# Patient Record
Sex: Male | Born: 1960
Health system: Southern US, Community
[De-identification: ages and names within clinical notes are randomized; demographics above are authoritative.]

## PROBLEM LIST (undated history)

## (undated) DIAGNOSIS — K219 Gastro-esophageal reflux disease without esophagitis: Secondary | ICD-10-CM

## (undated) DIAGNOSIS — I639 Cerebral infarction, unspecified: Secondary | ICD-10-CM

## (undated) DIAGNOSIS — F1010 Alcohol abuse, uncomplicated: Secondary | ICD-10-CM

## (undated) DIAGNOSIS — K279 Peptic ulcer, site unspecified, unspecified as acute or chronic, without hemorrhage or perforation: Secondary | ICD-10-CM

## (undated) DIAGNOSIS — K222 Esophageal obstruction: Secondary | ICD-10-CM

## (undated) DIAGNOSIS — I10 Essential (primary) hypertension: Secondary | ICD-10-CM

## (undated) DIAGNOSIS — E785 Hyperlipidemia, unspecified: Secondary | ICD-10-CM

## (undated) DIAGNOSIS — D649 Anemia, unspecified: Secondary | ICD-10-CM

## (undated) DIAGNOSIS — K269 Duodenal ulcer, unspecified as acute or chronic, without hemorrhage or perforation: Secondary | ICD-10-CM

## (undated) DIAGNOSIS — J189 Pneumonia, unspecified organism: Secondary | ICD-10-CM

## (undated) DIAGNOSIS — K2270 Barrett's esophagus without dysplasia: Secondary | ICD-10-CM

## (undated) DIAGNOSIS — F1410 Cocaine abuse, uncomplicated: Secondary | ICD-10-CM

## (undated) HISTORY — DX: Gastro-esophageal reflux disease without esophagitis: K21.9

## (undated) HISTORY — DX: Hyperlipidemia, unspecified: E78.5

---

## 2007-04-23 ENCOUNTER — Emergency Department (HOSPITAL_COMMUNITY): Admission: EM | Admit: 2007-04-23 | Discharge: 2007-04-23 | Payer: Self-pay | Admitting: Emergency Medicine

## 2009-08-24 ENCOUNTER — Emergency Department (HOSPITAL_COMMUNITY): Admission: EM | Admit: 2009-08-24 | Discharge: 2009-08-24 | Payer: Self-pay | Admitting: Emergency Medicine

## 2011-09-17 ENCOUNTER — Emergency Department (HOSPITAL_COMMUNITY): Payer: Self-pay

## 2011-09-17 ENCOUNTER — Emergency Department (HOSPITAL_COMMUNITY)
Admission: EM | Admit: 2011-09-17 | Discharge: 2011-09-17 | Disposition: A | Discharge status: Home / Self Care | Payer: Self-pay | Attending: Emergency Medicine | Admitting: Emergency Medicine

## 2011-09-17 ENCOUNTER — Encounter: Payer: Self-pay | Admitting: *Deleted

## 2011-09-17 DIAGNOSIS — J40 Bronchitis, not specified as acute or chronic: Secondary | ICD-10-CM | POA: Insufficient documentation

## 2011-09-17 DIAGNOSIS — F17200 Nicotine dependence, unspecified, uncomplicated: Secondary | ICD-10-CM | POA: Insufficient documentation

## 2011-09-17 DIAGNOSIS — J069 Acute upper respiratory infection, unspecified: Secondary | ICD-10-CM | POA: Insufficient documentation

## 2011-09-17 MED ORDER — DOXYCYCLINE HYCLATE 100 MG PO CAPS: 100.0000 mg | ORAL_CAPSULE | Freq: Two times a day (BID) | ORAL | Status: AC

## 2011-09-17 MED ORDER — PROMETHAZINE-CODEINE 6.25-10 MG/5ML PO SYRP: 5.0000 mL | ORAL_SOLUTION | ORAL | Status: AC | PRN

## 2011-09-17 MED ORDER — IBUPROFEN 800 MG PO TABS: 800.0000 mg | ORAL_TABLET | Freq: Three times a day (TID) | ORAL | Status: AC

## 2011-09-17 NOTE — ED Provider Notes (Signed)
History     CSN: [id number] Arrival date & time: 09/17/2011 12:56 PM   First MD Initiated Contact with Patient 09/17/11 1406      Chief Complaint  Patient presents with  . Cough  . Nasal Congestion    (Consider location/radiation/quality/duration/timing/severity/associated sxs/prior treatment) Patient is a 50 y.o. male presenting with cough. The history is provided by the patient.  Cough This is a new problem. The current episode started more than 2 days ago. The problem occurs hourly. The problem has not changed since onset.The cough is productive of sputum. The maximum temperature recorded prior to his arrival was 100 to 100.9 F. Associated symptoms include chest pain, chills, rhinorrhea, sore throat and myalgias. Pertinent negatives include no shortness of breath and no wheezing. He has tried nothing for the symptoms. He is a smoker. His past medical history is significant for asthma. His past medical history does not include bronchitis, pneumonia or COPD.    History reviewed. No pertinent past medical history.  History reviewed. No pertinent past surgical history.  No family history on file.  History  Substance Use Topics  . Smoking status: Current Everyday Smoker -- 0.5 packs/day    Types: Cigarettes  . Smokeless tobacco: Not on file  . Alcohol Use: 1.2 oz/week    2 Cans of beer per week     every other day      Review of Systems  Constitutional: Positive for chills. Negative for activity change.       All ROS Neg except as noted in HPI  HENT: Positive for sore throat and rhinorrhea. Negative for nosebleeds and neck pain.   Eyes: Negative for photophobia and discharge.  Respiratory: Positive for cough. Negative for shortness of breath and wheezing.   Cardiovascular: Positive for chest pain. Negative for palpitations.  Gastrointestinal: Negative for abdominal pain and blood in stool.  Genitourinary: Negative for dysuria, frequency and hematuria.  Musculoskeletal:  Positive for myalgias. Negative for back pain and arthralgias.  Skin: Negative.   Neurological: Negative for dizziness, seizures and speech difficulty.  Psychiatric/Behavioral: Negative for hallucinations and confusion.    Allergies  Review of patient's allergies indicates no known allergies.  Home Medications   Current Outpatient Rx  Name Route Sig Dispense Refill  . DOXYCYCLINE HYCLATE 100 MG PO CAPS Oral Take 1 capsule (100 mg total) by mouth 2 (two) times daily. 14 capsule 0  . IBUPROFEN 800 MG PO TABS Oral Take 1 tablet (800 mg total) by mouth 3 (three) times daily. 21 tablet 0  . PROMETHAZINE-CODEINE 6.25-10 MG/5ML PO SYRP Oral Take 5 mLs by mouth every 4 (four) hours as needed for cough. 120 mL 0    BP 130/85  Pulse 98  Temp(Src) 98.6 F (37 C) (Oral)  Resp 18  Ht 5' 8.5" (1.74 m)  Wt 155 lb (70.308 kg)  BMI 23.23 kg/m2  SpO2 100%  Physical Exam  Nursing note and vitals reviewed. Constitutional: He is oriented to person, place, and time. He appears well-developed and well-nourished.  Non-toxic appearance.  HENT:  Head: Normocephalic.  Right Ear: Tympanic membrane and external ear normal.  Left Ear: Tympanic membrane and external ear normal.       Nasal congestion  Eyes: EOM and lids are normal. Pupils are equal, round, and reactive to light.  Neck: Normal range of motion. Neck supple. Carotid bruit is not present.  Cardiovascular: Normal rate, regular rhythm, normal heart sounds, intact distal pulses and normal pulses.   Pulmonary/Chest: No respiratory  distress. He has rhonchi.  Abdominal: Soft. Bowel sounds are normal. There is no tenderness. There is no guarding.  Musculoskeletal: Normal range of motion.  Lymphadenopathy:       Head (right side): No submandibular adenopathy present.       Head (left side): No submandibular adenopathy present.    He has no cervical adenopathy.  Neurological: He is alert and oriented to person, place, and time. He has normal  strength. No cranial nerve deficit or sensory deficit.  Skin: Skin is warm and dry.  Psychiatric: He has a normal mood and affect. His speech is normal.    ED Course  Procedures (including critical care time)  Labs Reviewed - No data to display Dg Chest 2 View  09/17/2011  *RADIOLOGY REPORT*  Clinical Data: Cough for 3 days.  Smoker.  History of childhood asthma.  CHEST - 2 VIEW  Comparison: None.  Findings: Mild hyperinflation. Midline trachea.  Normal heart size and mediastinal contours. No pleural effusion or pneumothorax. Focal lucency at the left lung base.  Lungs are clear.  IMPRESSION:  1.  Hyperinflation, without acute disease. 2.  Focal lucency at the left lung base.  Favor focal bullous disease.  The sequelae of remote infection could have a similar appearance.  Less likely, this could be technique related/artifactual.  Original Report Authenticated By: Consuello Bossier, M.D.     1. Bronchitis   2. URI (upper respiratory infection)       MDM  I have reviewed nursing notes, vital signs, and all appropriate lab and imaging results for this patient.        Kathie Dike, [place] 09/17/11 1416

## 2011-09-17 NOTE — ED Notes (Signed)
Pt states has had a cough and cold like symptoms x 3 days. Pt denies fever/chills, N/V and pain at this time.

## 2011-09-17 NOTE — ED Notes (Signed)
C/o nasal congestion and cough x 4 days

## 2011-09-19 NOTE — ED Provider Notes (Signed)
Medical screening examination/treatment/procedure(s) were performed by non-physician practitioner and as supervising physician I was immediately available for consultation/collaboration.  Nicoletta Dress. Colon Branch, MD 09/19/11 1122

## 2012-03-03 ENCOUNTER — Emergency Department (HOSPITAL_COMMUNITY): Payer: Self-pay

## 2012-03-03 ENCOUNTER — Emergency Department (HOSPITAL_COMMUNITY)
Admission: EM | Admit: 2012-03-03 | Discharge: 2012-03-03 | Disposition: A | Discharge status: Home / Self Care | Payer: Self-pay | Attending: Emergency Medicine | Admitting: Emergency Medicine

## 2012-03-03 ENCOUNTER — Encounter (HOSPITAL_COMMUNITY): Payer: Self-pay | Admitting: Emergency Medicine

## 2012-03-03 DIAGNOSIS — R112 Nausea with vomiting, unspecified: Secondary | ICD-10-CM | POA: Insufficient documentation

## 2012-03-03 DIAGNOSIS — R1012 Left upper quadrant pain: Secondary | ICD-10-CM | POA: Insufficient documentation

## 2012-03-03 LAB — URINALYSIS, ROUTINE W REFLEX MICROSCOPIC
Bilirubin Urine: NEGATIVE
Glucose, UA: NEGATIVE mg/dL
Protein, ur: NEGATIVE mg/dL

## 2012-03-03 LAB — URINE MICROSCOPIC-ADD ON

## 2012-03-03 NOTE — Discharge Instructions (Signed)
Your xrays show only stool and gas in the colon. Use a mild laxative like Miralax to have several bowel movements.

## 2012-03-03 NOTE — ED Provider Notes (Signed)
History     CSN: [id number]  Arrival date & time 03/03/12  0151   First MD Initiated Contact with Patient 03/03/12 0234      Chief Complaint  Patient presents with  . Flank Pain    (Consider location/radiation/quality/duration/timing/severity/associated sxs/prior treatment) HPI  Evan Chavez is a 51 y.o. male who presents to the Emergency Department complaining of left upper quadrant pain that began an hour PTA. Pain was severe and he had two episodes of vomiting. States last BM yesterday and normal. Denies fever, chills, diarrhea, cough, shortness of breath. Currently is not nauseated. Pain has subsided. History reviewed. No pertinent past medical history.  History reviewed. No pertinent past surgical history.  No family history on file.  History  Substance Use Topics  . Smoking status: Current Everyday Smoker -- 0.5 packs/day    Types: Cigarettes  . Smokeless tobacco: Not on file  . Alcohol Use: 1.2 oz/week    2 Cans of beer per week     every other day      Review of Systems  Constitutional: Negative for fever.       10 Systems reviewed and are negative for acute change except as noted in the HPI.  HENT: Negative for congestion.   Eyes: Negative for discharge and redness.  Respiratory: Negative for cough and shortness of breath.   Cardiovascular: Negative for chest pain.  Gastrointestinal: Positive for nausea, vomiting and abdominal pain.  Musculoskeletal: Negative for back pain.  Skin: Negative for rash.  Neurological: Negative for syncope, numbness and headaches.  Psychiatric/Behavioral:       No behavior change.    Allergies  Review of patient's allergies indicates no known allergies.  Home Medications  No current outpatient prescriptions on file.  BP 158/96  Pulse 90  Temp(Src) 98 F (36.7 C) (Oral)  Resp 20  Ht 5\' 8"  (1.727 m)  Wt 159 lb (72.122 kg)  BMI 24.18 kg/m2  SpO2 99%  Physical Exam  Nursing note and vitals  reviewed. Constitutional: He is oriented to person, place, and time.       Awake, alert, nontoxic appearance.  HENT:  Head: Atraumatic.  Eyes: Right eye exhibits no discharge. Left eye exhibits no discharge.  Neck: Neck supple.  Cardiovascular: Normal rate, normal heart sounds and intact distal pulses.   Pulmonary/Chest: Effort normal. He exhibits no tenderness.  Abdominal: Soft. There is tenderness. There is no rebound and no guarding.       Mild LUQ tenderness to palpation  Genitourinary:       No cva tenderness  Musculoskeletal: He exhibits no tenderness.       Baseline ROM, no obvious new focal weakness.  Neurological: He is alert and oriented to person, place, and time.       Mental status and motor strength appears baseline for patient and situation.  Skin: No rash noted.  Psychiatric: He has a normal mood and affect.    ED Course  Procedures (including critical care time)  Results for orders placed during the hospital encounter of 03/03/12  URINALYSIS, ROUTINE W REFLEX MICROSCOPIC      Component Value Range   Color, Urine YELLOW  YELLOW    APPearance CLEAR  CLEAR    Specific Gravity, Urine 1.020  1.005 - 1.030    pH 7.0  5.0 - 8.0    Glucose, UA NEGATIVE  NEGATIVE (mg/dL)   Hgb urine dipstick TRACE (*) NEGATIVE    Bilirubin Urine NEGATIVE  NEGATIVE  Ketones, ur NEGATIVE  NEGATIVE (mg/dL)   Protein, ur NEGATIVE  NEGATIVE (mg/dL)   Urobilinogen, UA 0.2  0.0 - 1.0 (mg/dL)   Nitrite NEGATIVE  NEGATIVE    Leukocytes, UA NEGATIVE  NEGATIVE   URINE MICROSCOPIC-ADD ON      Component Value Range   Squamous Epithelial / LPF RARE  RARE    WBC, UA 7-10  <3 (WBC/hpf)   RBC / HPF 7-10  <3 (RBC/hpf)   Bacteria, UA RARE  RARE    Urine-Other MUCOUS PRESENT     Dg Abd Acute W/chest  03/03/2012  *RADIOLOGY REPORT*  Clinical Data: Left-sided abdominal pain  ACUTE ABDOMEN SERIES (ABDOMEN 2 VIEW & CHEST 1 VIEW)  Comparison: 09/17/2011 chest radiograph  Findings: Increased lucency  of the left lower lobe is again noted. Otherwise, lungs are clear.  No focal consolidation, pleural effusion, or pneumothorax.  Cardiomediastinal contours are within normal limits.  No acute osseous finding.  Nonobstructive bowel gas pattern.  No free intraperitoneal air identified.  Organ outlines normal where seen.  No acute osseous abnormality.  IMPRESSION: Nonobstructive bowel gas pattern.  Hyperlucency of the left lower lobe is unchanged and may be congenital or secondary to underlying bullous change.  Original Report Authenticated By: Waneta Martins, M.D.       MDM  Patient with left upper quadrant discomfort that began earlier this evening. Denies fever, chills, hematuria. Xray with no acute process noted. Patient has been pain free since arrival. Taken PO fluids and a snack. Reviewed results with patient. Pt stable in ED with no significant deterioration in condition.The patient appears reasonably screened and/or stabilized for discharge and I doubt any other medical condition or other [facility] requiring further screening, evaluation, or treatment in the ED at this time prior to discharge.  MDM Reviewed: nursing note and vitals Interpretation: labs and x-ray           Nicoletta Dress. Colon Branch, MD 03/03/12 [phone]

## 2012-03-03 NOTE — ED Notes (Signed)
Patient c/o left flank pain that started about an hour ago.  States has vomited x 2.

## 2018-06-13 DIAGNOSIS — I639 Cerebral infarction, unspecified: Secondary | ICD-10-CM

## 2018-06-13 HISTORY — DX: Cerebral infarction, unspecified: I63.9

## 2018-07-12 ENCOUNTER — Other Ambulatory Visit: Payer: Self-pay

## 2018-07-12 ENCOUNTER — Emergency Department (HOSPITAL_COMMUNITY): Payer: Medicaid Other

## 2018-07-12 ENCOUNTER — Encounter (HOSPITAL_COMMUNITY): Payer: Self-pay | Admitting: Emergency Medicine

## 2018-07-12 ENCOUNTER — Inpatient Hospital Stay (HOSPITAL_COMMUNITY)
Admission: EM | Admit: 2018-07-12 | Discharge: 2018-07-15 | DRG: 065 | Disposition: A | Discharge status: Skilled Nursing Facility | Payer: Medicaid Other | Attending: Internal Medicine | Admitting: Internal Medicine

## 2018-07-12 ENCOUNTER — Observation Stay (HOSPITAL_COMMUNITY): Payer: Medicaid Other

## 2018-07-12 DIAGNOSIS — Z720 Tobacco use: Secondary | ICD-10-CM

## 2018-07-12 DIAGNOSIS — I471 Supraventricular tachycardia: Secondary | ICD-10-CM | POA: Diagnosis present

## 2018-07-12 DIAGNOSIS — R29710 NIHSS score 10: Secondary | ICD-10-CM | POA: Diagnosis present

## 2018-07-12 DIAGNOSIS — R29810 Facial weakness: Secondary | ICD-10-CM | POA: Diagnosis present

## 2018-07-12 DIAGNOSIS — I6381 Other cerebral infarction due to occlusion or stenosis of small artery: Principal | ICD-10-CM | POA: Diagnosis present

## 2018-07-12 DIAGNOSIS — E785 Hyperlipidemia, unspecified: Secondary | ICD-10-CM | POA: Diagnosis present

## 2018-07-12 DIAGNOSIS — F1410 Cocaine abuse, uncomplicated: Secondary | ICD-10-CM | POA: Diagnosis present

## 2018-07-12 DIAGNOSIS — G8191 Hemiplegia, unspecified affecting right dominant side: Secondary | ICD-10-CM | POA: Diagnosis present

## 2018-07-12 DIAGNOSIS — F1010 Alcohol abuse, uncomplicated: Secondary | ICD-10-CM | POA: Diagnosis present

## 2018-07-12 DIAGNOSIS — R471 Dysarthria and anarthria: Secondary | ICD-10-CM | POA: Diagnosis present

## 2018-07-12 DIAGNOSIS — F17210 Nicotine dependence, cigarettes, uncomplicated: Secondary | ICD-10-CM | POA: Diagnosis present

## 2018-07-12 DIAGNOSIS — I5032 Chronic diastolic (congestive) heart failure: Secondary | ICD-10-CM | POA: Diagnosis present

## 2018-07-12 DIAGNOSIS — Z8673 Personal history of transient ischemic attack (TIA), and cerebral infarction without residual deficits: Secondary | ICD-10-CM | POA: Diagnosis present

## 2018-07-12 DIAGNOSIS — I639 Cerebral infarction, unspecified: Secondary | ICD-10-CM | POA: Insufficient documentation

## 2018-07-12 DIAGNOSIS — I110 Hypertensive heart disease with heart failure: Secondary | ICD-10-CM | POA: Diagnosis present

## 2018-07-12 DIAGNOSIS — I10 Essential (primary) hypertension: Secondary | ICD-10-CM

## 2018-07-12 HISTORY — DX: Alcohol abuse, uncomplicated: F10.10

## 2018-07-12 HISTORY — DX: Cocaine abuse, uncomplicated: F14.10

## 2018-07-12 LAB — COMPREHENSIVE METABOLIC PANEL
ALBUMIN: 4 g/dL (ref 3.5–5.0)
ALT: 30 U/L (ref 0–44)
AST: 18 U/L (ref 15–41)
Alkaline Phosphatase: 88 U/L (ref 38–126)
Anion gap: 10 (ref 5–15)
BILIRUBIN TOTAL: 0.5 mg/dL (ref 0.3–1.2)
BUN: 12 mg/dL (ref 6–20)
CHLORIDE: 107 mmol/L (ref 98–111)
CO2: 20 mmol/L — AB (ref 22–32)
Calcium: 8.8 mg/dL — ABNORMAL LOW (ref 8.9–10.3)
Creatinine, Ser: 0.91 mg/dL (ref 0.61–1.24)
GFR calc Af Amer: >60 mL/min (ref >60)
GFR calc non Af Amer: >60 mL/min (ref >60)
Glucose, Bld: 100 mg/dL — ABNORMAL HIGH (ref 70–99)
Potassium: 4.1 mmol/L (ref 3.5–5.1)
SODIUM: 137 mmol/L (ref 135–145)
Total Protein: 8 g/dL (ref 6.5–8.1)

## 2018-07-12 LAB — RAPID URINE DRUG SCREEN, HOSP PERFORMED
Amphetamines: NOT DETECTED
BARBITURATES: NOT DETECTED
BENZODIAZEPINES: NOT DETECTED
COCAINE: POSITIVE — AB
OPIATES: NOT DETECTED
Tetrahydrocannabinol: NOT DETECTED

## 2018-07-12 LAB — CBC
HCT: 44.1 % (ref 39.0–52.0)
HEMOGLOBIN: 14 g/dL (ref 13.0–17.0)
MCH: 27.6 pg (ref 26.0–34.0)
MCHC: 31.7 g/dL (ref 30.0–36.0)
MCV: 87 fL (ref 78.0–100.0)
Platelets: 269 10*3/uL (ref 150–400)
RBC: 5.07 MIL/uL (ref 4.22–5.81)
RDW: 13.6 % (ref 11.5–15.5)
WBC: 5.4 10*3/uL (ref 4.0–10.5)

## 2018-07-12 LAB — URINALYSIS, ROUTINE W REFLEX MICROSCOPIC
BACTERIA UA: NONE SEEN
Bilirubin Urine: NEGATIVE
GLUCOSE, UA: NEGATIVE mg/dL
KETONES UR: NEGATIVE mg/dL
LEUKOCYTES UA: NEGATIVE
Nitrite: NEGATIVE
PROTEIN: NEGATIVE mg/dL
Specific Gravity, Urine: >1.046 — ABNORMAL HIGH (ref 1.005–1.030)
pH: 5 (ref 5.0–8.0)

## 2018-07-12 LAB — APTT: APTT: 30 s (ref 24–36)

## 2018-07-12 LAB — DIFFERENTIAL
BASOS ABS: 0 10*3/uL (ref 0.0–0.1)
Basophils Relative: 1 %
Eosinophils Absolute: 0.1 10*3/uL (ref 0.0–0.7)
Eosinophils Relative: 1 %
LYMPHS ABS: 1.4 10*3/uL (ref 0.7–4.0)
Lymphocytes Relative: 26 %
Monocytes Absolute: 0.3 10*3/uL (ref 0.1–1.0)
Monocytes Relative: 6 %
NEUTROS PCT: 66 %
Neutro Abs: 3.6 10*3/uL (ref 1.7–7.7)

## 2018-07-12 LAB — I-STAT TROPONIN, ED: TROPONIN I, POC: 0 ng/mL (ref 0.00–0.08)

## 2018-07-12 LAB — PROTIME-INR
INR: 1.02
Prothrombin Time: 13.3 seconds (ref 11.4–15.2)

## 2018-07-12 LAB — CBG MONITORING, ED: GLUCOSE-CAPILLARY: 86 mg/dL (ref 70–99)

## 2018-07-12 LAB — ETHANOL: Alcohol, Ethyl (B): <10 mg/dL (ref <10)

## 2018-07-12 MED ORDER — ASPIRIN 325 MG PO TABS
325.0000 mg | ORAL_TABLET | Freq: Every day | ORAL | Status: DC
  Administered 2018-07-12 – 2018-07-15 (×4): 325 mg via ORAL
  Filled 2018-07-12 (×4): qty 1

## 2018-07-12 MED ORDER — ACETAMINOPHEN 650 MG RE SUPP: 650.0000 mg | RECTAL | Status: DC | PRN

## 2018-07-12 MED ORDER — HYDRALAZINE HCL 20 MG/ML IJ SOLN: 10.0000 mg | Freq: Four times a day (QID) | INTRAMUSCULAR | Status: DC | PRN

## 2018-07-12 MED ORDER — IOPAMIDOL (ISOVUE-370) INJECTION 76%
100.0000 mL | Freq: Once | INTRAVENOUS | Status: AC | PRN
  Administered 2018-07-12: 100 mL via INTRAVENOUS

## 2018-07-12 MED ORDER — STROKE: EARLY STAGES OF RECOVERY BOOK
Freq: Once | Status: AC
  Administered 2018-07-12: 1
  Filled 2018-07-12 (×2): qty 1

## 2018-07-12 MED ORDER — ACETAMINOPHEN 325 MG PO TABS: 650.0000 mg | ORAL_TABLET | ORAL | Status: DC | PRN

## 2018-07-12 MED ORDER — ONDANSETRON HCL 4 MG/2ML IJ SOLN
4.0000 mg | Freq: Four times a day (QID) | INTRAMUSCULAR | Status: DC | PRN
  Administered 2018-07-12 – 2018-07-14 (×2): 4 mg via INTRAVENOUS
  Filled 2018-07-12 (×2): qty 2

## 2018-07-12 MED ORDER — ENOXAPARIN SODIUM 40 MG/0.4ML ~~LOC~~ SOLN
40.0000 mg | SUBCUTANEOUS | Status: DC
  Administered 2018-07-12 – 2018-07-14 (×3): 40 mg via SUBCUTANEOUS
  Filled 2018-07-12 (×3): qty 0.4

## 2018-07-12 MED ORDER — SENNOSIDES-DOCUSATE SODIUM 8.6-50 MG PO TABS
1.0000 | ORAL_TABLET | Freq: Every evening | ORAL | Status: DC | PRN
  Filled 2018-07-12: qty 1

## 2018-07-12 MED ORDER — ACETAMINOPHEN 160 MG/5ML PO SOLN: 650.0000 mg | ORAL | Status: DC | PRN

## 2018-07-12 MED ORDER — SODIUM CHLORIDE 0.9 % IV SOLN
INTRAVENOUS | Status: DC
  Administered 2018-07-12: 22:00:00 via INTRAVENOUS

## 2018-07-12 NOTE — ED Notes (Signed)
ED TO INPATIENT HANDOFF REPORT  Name/Age/Gender Evan Chavez 57 y.o. male  Code Status   Home/SNF/Other Home  Chief Complaint ?STROKE  Level of Care/Admitting Diagnosis ED Disposition    ED Disposition Condition Comment   Admit  Hospital Area: [facility] [[id number]]  Level of Care: Telemetry [5]  Diagnosis: Acute ischemic stroke [facility]) [[id number]]  Admitting Physician: TAT, DAVID [[id number]]  Attending Physician: TAT, DAVID [[id number]]  PT Class (Do Not Modify): Observation [104]  PT Acc Code (Do Not Modify): Observation [10022]       Medical History History reviewed. No pertinent past medical history.  Allergies No Known Allergies  IV Location/Drains/Wounds Patient Lines/Drains/Airways Status   Active Line/Drains/Airways    Name:   Placement date:   Placement time:   Site:   Days:   Peripheral IV 07/12/18 Left Forearm   07/12/18    0915    Forearm   less than 1   Peripheral IV 07/12/18 Left Antecubital   07/12/18    6962    Antecubital   less than 1          Labs/Imaging Results for orders placed or performed during the hospital encounter of 07/12/18 (from the past 48 hour(s))  Urine rapid drug screen (hosp performed)     Status: Abnormal   Collection Time: 07/12/18  9:13 AM  Result Value Ref Range   Opiates NONE DETECTED NONE DETECTED   Cocaine POSITIVE (A) NONE DETECTED   Benzodiazepines NONE DETECTED NONE DETECTED   Amphetamines NONE DETECTED NONE DETECTED   Tetrahydrocannabinol NONE DETECTED NONE DETECTED   Barbiturates NONE DETECTED NONE DETECTED    Comment: (NOTE) DRUG SCREEN FOR MEDICAL PURPOSES ONLY.  IF CONFIRMATION IS NEEDED FOR ANY PURPOSE, NOTIFY LAB WITHIN 5 DAYS. LOWEST DETECTABLE LIMITS FOR URINE DRUG SCREEN Drug Class                     Cutoff (ng/mL) Amphetamine and metabolites    1000 Barbiturate and metabolites    200 Benzodiazepine                 952 Tricyclics and metabolites     300 Opiates and metabolites        300 Cocaine and  metabolites        300 THC                            50 Performed at [facility], [address] [place], [place] [address]   Urinalysis, Routine w reflex microscopic     Status: Abnormal   Collection Time: 07/12/18  9:13 AM  Result Value Ref Range   Color, Urine YELLOW YELLOW   APPearance CLEAR CLEAR   Specific Gravity, Urine >1.046 (H) 1.005 - 1.030   pH 5.0 5.0 - 8.0   Glucose, UA NEGATIVE NEGATIVE mg/dL   Hgb urine dipstick SMALL (A) NEGATIVE   Bilirubin Urine NEGATIVE NEGATIVE   Ketones, ur NEGATIVE NEGATIVE mg/dL   Protein, ur NEGATIVE NEGATIVE mg/dL   Nitrite NEGATIVE NEGATIVE   Leukocytes, UA NEGATIVE NEGATIVE   RBC / HPF 11-20 0 - 5 RBC/hpf   WBC, UA 0-5 0 - 5 WBC/hpf   Bacteria, UA NONE SEEN NONE SEEN   Mucus PRESENT     Comment: Performed at [facility], [address] [place], [place] [address]  Ethanol     Status: None   Collection Time: 07/12/18  9:25 AM  Result Value Ref Range   Alcohol, Ethyl (B) <10 <10 mg/dL    Comment: (NOTE) Lowest detectable limit for serum alcohol is 10 mg/dL. For medical purposes only. Performed at [facility], [address], [place], [place] [address]   Protime-INR     Status: None   Collection Time: 07/12/18  9:25 AM  Result Value Ref Range   Prothrombin Time 13.3 11.4 - 15.2 seconds   INR 1.02     Comment: Performed at [facility], [address], [place], [place] [address]  APTT     Status: None   Collection Time: 07/12/18  9:25 AM  Result Value Ref Range   aPTT 30 24 - 36 seconds    Comment: Performed at [facility], [address], [place], [place] [address]  CBC     Status: None   Collection Time: 07/12/18  9:25 AM  Result Value Ref Range   WBC 5.4 4.0 - 10.5 K/uL   RBC 5.07 4.22 - 5.81 MIL/uL   Hemoglobin 14.0 13.0 - 17.0 g/dL   HCT 44.1 39.0 - 52.0 %   MCV 87.0 78.0 - 100.0 fL   MCH 27.6 26.0 - 34.0 pg   MCHC 31.7 30.0 - 36.0 g/dL   RDW 13.6 11.5 - 15.5 %   Platelets 269 150 - 400 K/uL    Comment: Performed at  [facility], [address] [place], [place] [address]  Differential     Status: None   Collection Time: 07/12/18  9:25 AM  Result Value Ref Range   Neutrophils Relative % 66 %   Neutro Abs 3.6 1.7 - 7.7 K/uL   Lymphocytes Relative 26 %   Lymphs Abs 1.4 0.7 - 4.0 K/uL   Monocytes Relative 6 %   Monocytes Absolute 0.3 0.1 - 1.0 K/uL   Eosinophils Relative 1 %   Eosinophils Absolute 0.1 0.0 - 0.7 K/uL   Basophils Relative 1 %   Basophils Absolute 0.0 0.0 - 0.1 K/uL    Comment: Performed at [facility], [address], [place], [place] [address]  Comprehensive metabolic panel     Status: Abnormal   Collection Time: 07/12/18  9:25 AM  Result Value Ref Range   Sodium 137 135 - 145 mmol/L   Potassium 4.1 3.5 - 5.1 mmol/L   Chloride 107 98 - 111 mmol/L   CO2 20 (L) 22 - 32 mmol/L   Glucose, Bld 100 (H) 70 - 99 mg/dL   BUN 12 6 - 20 mg/dL   Creatinine, Ser 0.91 0.61 - 1.24 mg/dL   Calcium 8.8 (L) 8.9 - 10.3 mg/dL   Total Protein 8.0 6.5 - 8.1 g/dL   Albumin 4.0 3.5 - 5.0 g/dL   AST 18 15 - 41 U/L   ALT 30 0 - 44 U/L   Alkaline Phosphatase 88 38 - 126 U/L   Total Bilirubin 0.5 0.3 - 1.2 mg/dL   GFR calc non Af Amer >60 >60 mL/min   GFR calc Af Amer >60 >60 mL/min    Comment: (NOTE) The eGFR has been calculated using the CKD EPI equation. This calculation has not been validated in all clinical situations. eGFR's persistently <60 mL/min signify possible Chronic Kidney Disease.    Anion gap 10 5 - 15    Comment: Performed at [facility], [address] [place], [place] [address]  I-stat troponin, ED     Status: None   Collection Time: 07/12/18  9:32 AM  Result Value Ref Range  Troponin i, poc 0.00 0.00 - 0.08 ng/mL   Comment 3            Comment: Due to the release kinetics of cTnI, a negative result within the first hours of the onset of symptoms does not rule out myocardial infarction with certainty. If myocardial infarction is still suspected, repeat the test at  appropriate intervals.   CBG monitoring, ED     Status: None   Collection Time: 07/12/18 10:05 AM  Result Value Ref Range   Glucose-Capillary 86 70 - 99 mg/dL   Ct Angio Head W Or Wo Contrast  Result Date: 07/12/2018 CLINICAL DATA:  Right arm weakness since 07/11/2018 EXAM: CT ANGIOGRAPHY HEAD AND NECK TECHNIQUE: Multidetector CT imaging of the head and neck was performed using the standard protocol during bolus administration of intravenous contrast. Multiplanar CT image reconstructions and MIPs were obtained to evaluate the vascular anatomy. Carotid stenosis measurements (when applicable) are obtained utilizing NASCET criteria, using the distal internal carotid diameter as the denominator. CONTRAST:  142m ISOVUE-370 IOPAMIDOL (ISOVUE-370) INJECTION 76% COMPARISON:  Noncontrast head CT from earlier today FINDINGS: CTA NECK FINDINGS Aortic arch: Limited coverage is negative. Brachiocephalic origin is not seen. Right carotid system: Mild narrowing of the distal ICA due to tortuosity and kinking. Mild non ulcerated plaque about the bifurcation. Left carotid system: Mild calcified plaque at the ICA bulb. No stenosis or ulceration. Vertebral arteries: No proximal subclavian stenosis or ulceration. Skeleton: Left dominant vertebral artery. Mild calcified plaque at the right vertebral origin. No flow limiting stenosis in the vertebrals or subclavians. Other neck: 11 mm left thyroid nodule, partially cystic, below size threshold for sonographic follow-up per consensus guidelines. Upper chest: Mild centrilobular emphysema. Review of the MIP images confirms the above findings CTA HEAD FINDINGS Anterior circulation: Calcified plaque on the carotid siphons. No branch occlusion or flow limiting stenosis. Negative for aneurysm. Posterior circulation: Vessels are smooth and widely patent. Fenestration of the proximal basilar. Venous sinuses: Patent Anatomic variants: As above Delayed phase: No abnormal intracranial  enhancement. Multiple remote small vessel infarcts. Review of the MIP images confirms the above findings IMPRESSION: 1. No emergent finding. 2. Mild atherosclerosis in the neck without stenosis or ulceration. 3. Multiple lacunar infarcts. 4.  Emphysema (ICD10-J43.9). Electronically Signed   By: JMonte FantasiaM.D.   On: 07/12/2018 11:10   Ct Head Wo Contrast  Result Date: 07/12/2018 CLINICAL DATA:  Right-sided weakness and right facial droop EXAM: CT HEAD WITHOUT CONTRAST TECHNIQUE: Contiguous axial images were obtained from the base of the skull through the vertex without intravenous contrast. COMPARISON:  None. FINDINGS: Brain: There is mild diffuse atrophy for age. There is no intracranial mass, hemorrhage, extra-axial fluid collection, or midline shift. There is mild small vessel disease in the centra semiovale bilaterally. There is small vessel disease with clear infarcts in the anterior limbs of the left internal and external capsules. There is a lacunar infarct in the anterior superior left thalamus. There is a small lacunar infarct in the anterior most aspect of the right thalamus. There are age uncertain small infarcts in each lower midbrain-upper pons junction. There is a focus of decreased attenuation in the inferior left centrum semiovale at the level of the left frontal horn of the lateral ventricle which appears more prominent than other areas of small vessel disease and may represent a recent white matter infarct in this area. Vascular: No appreciable hyperdense vessel is demonstrable on this study. There is calcification in each carotid siphon  region. Skull: Bony calvarium appears intact. Sinuses/Orbits: There is mild mucosal thickening in several ethmoid air cells. Other visualized paranasal sinuses are clear. Visualized orbits appear symmetric bilaterally. Other: Mastoid air cells are clear. IMPRESSION: Atrophy with periventricular small vessel disease. Prior lacunar infarcts in the left basal  ganglia and bilateral thalami. Age uncertain infarcts in the lower midbrain-upper pons junction, more notable on the left than on the right. Question recent small infarct in the white matter of the inferior left centrum semiovale at the frontal horn left lateral ventricle level. No mass or hemorrhage. Foci of arterial vascular calcification noted. Mucosal thickening noted in several ethmoid air cells. Electronically Signed   By: Lowella Grip III M.D.   On: 07/12/2018 09:39   Ct Angio Neck W And/or Wo Contrast  Result Date: 07/12/2018 CLINICAL DATA:  Right arm weakness since 07/11/2018 EXAM: CT ANGIOGRAPHY HEAD AND NECK TECHNIQUE: Multidetector CT imaging of the head and neck was performed using the standard protocol during bolus administration of intravenous contrast. Multiplanar CT image reconstructions and MIPs were obtained to evaluate the vascular anatomy. Carotid stenosis measurements (when applicable) are obtained utilizing NASCET criteria, using the distal internal carotid diameter as the denominator. CONTRAST:  114m ISOVUE-370 IOPAMIDOL (ISOVUE-370) INJECTION 76% COMPARISON:  Noncontrast head CT from earlier today FINDINGS: CTA NECK FINDINGS Aortic arch: Limited coverage is negative. Brachiocephalic origin is not seen. Right carotid system: Mild narrowing of the distal ICA due to tortuosity and kinking. Mild non ulcerated plaque about the bifurcation. Left carotid system: Mild calcified plaque at the ICA bulb. No stenosis or ulceration. Vertebral arteries: No proximal subclavian stenosis or ulceration. Skeleton: Left dominant vertebral artery. Mild calcified plaque at the right vertebral origin. No flow limiting stenosis in the vertebrals or subclavians. Other neck: 11 mm left thyroid nodule, partially cystic, below size threshold for sonographic follow-up per consensus guidelines. Upper chest: Mild centrilobular emphysema. Review of the MIP images confirms the above findings CTA HEAD FINDINGS  Anterior circulation: Calcified plaque on the carotid siphons. No branch occlusion or flow limiting stenosis. Negative for aneurysm. Posterior circulation: Vessels are smooth and widely patent. Fenestration of the proximal basilar. Venous sinuses: Patent Anatomic variants: As above Delayed phase: No abnormal intracranial enhancement. Multiple remote small vessel infarcts. Review of the MIP images confirms the above findings IMPRESSION: 1. No emergent finding. 2. Mild atherosclerosis in the neck without stenosis or ulceration. 3. Multiple lacunar infarcts. 4.  Emphysema (ICD10-J43.9). Electronically Signed   By: JMonte FantasiaM.D.   On: 07/12/2018 11:10    Pending Labs [employer](From admission, onward)    Start     Ordered   Signed and Held  HIV antibody (Routine Testing)  Once,   R     Signed and Held   Signed and Held  Hemoglobin A1c  Tomorrow morning,   R     Signed and Held   Signed and Held  Lipid panel  Tomorrow morning,   R    Comments:  Fasting    Signed and Held   SVisual merchandiserand Held  Urine rapid drug screen (hosp performed)  STAT,   R     Signed and Held   Signed and Held  Urinalysis, Complete w Microscopic  Once,   R     Signed and Held   Signed and Held  Culture, Urine  Once,   R     Signed and Held          Vitals/Pain TFedEx  07/12/18 1600 07/12/18 1630 07/12/18 1700 07/12/18 1730  BP: (!) 162/113 (!) 155/111 (!) 161/103 (!) 148/91  Pulse: 86  83 81  Resp:      Temp:      TempSrc:      SpO2: 99%  99% 98%  Weight:      Height:      PainSc:        Isolation Precautions No active isolations  Medications Medications  iopamidol (ISOVUE-370) 76 % injection 100 mL (100 mLs Intravenous Contrast Given 07/12/18 1024)    Mobility walks

## 2018-07-12 NOTE — ED Notes (Signed)
Pt's daughter in law arrived and disclosed pt has been using cocaine for many years, but since the death of his wife 2 1/2 years ago his use has increased.  Has been laying around for the past few days and using more.  States pt was last seen up walking about 3 days ago.

## 2018-07-12 NOTE — ED Notes (Signed)
Pt unable to provide urine sample at this time 

## 2018-07-12 NOTE — ED Triage Notes (Signed)
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  Pt states he thought it would get better and ignored it.  Pt has significant right sided weakness with right facial droop.

## 2018-07-12 NOTE — H&P (Signed)
History and Physical  Evan Chavez:[id number] DOB: [birth year] DOA: 07/12/2018   PCP: Patient, No Pcp Per   Patient coming from: Home  Chief Complaint: dysarthria, right hemiparesis  HPI:  Evan Chavez is a 57 y.o. male with medical history of hypertension and polysubstance abuse presenting with right hemiparesis and dysarthria that began around 11:30 PM on 07/11/2018 while the patient was at work.  The patient works as a Curator, and he noticed some weakness in his right upper extremity and subsequently right lower extremity while he was at work.  Later, the patient developed some dysarthria.  He did not think anything of it and subsequently finished his shift and went home.  He woke up in the morning of 07/12/2018 with persistent symptoms and right facial droop.  As result, the patient presented to emergency department for further evaluation.  He denies any headache, visual disturbance, syncope, chest pain, shortness breath, nausea or vomiting, diarrhea, abdominal pain.  Notably, the patient states that he used cocaine 2 to 3 days prior to this admission.  He states that he uses cocaine to 3 times per week.  He denies any other illicit drug use.  He smokes 1/2 pack/day. In the emergency department, the patient was afebrile hemodynamically stable saturating 100% on room air.  BMP, LFTs, and CBC were essentially unremarkable.  CT of the brain showed prior lacunar strokes in the basal ganglia and bilateral thalami with age uncertain infarcts of the left pons/midbrain junction.  CT angiogram of the head and neck showed no hemodynamically significant stenosis.  Assessment/Plan: Acute ischemic stroke -Neurology Consult -PT/OT evaluation -Speech therapy eval -CT brain--CT of the brain showed prior lacunar strokes in the basal ganglia and bilateral thalami with age uncertain infarcts of the left pons/midbrain junction.   -MRI brain-- -CTA H&N--no hemodynamically significant  stenosis -Echo-- -LDL-- -HbA1C-- -Antiplatelet--ASA 325 mg   Essential Hypertension -allow for permissive HTN -has not seen a physician x 10 years  Polysubstance abuse -tobacco and cocaine -I have discussed tobacco cessation with the patient.  I have counseled the patient regarding the negative impacts of continued tobacco use including but not limited to lung cancer, COPD, and cardiovascular disease.  I have discussed alternatives to tobacco and modalities that may help facilitate tobacco cessation including but not limited to biofeedback, hypnosis, and medications.  Total time spent with tobacco counseling was 4 minutes.            History reviewed. No pertinent past medical history. History reviewed. No pertinent surgical history. Social History:  reports that he has been smoking cigarettes. He has been smoking about 0.50 packs per day. He has never used smokeless tobacco. He reports that he drinks about 2.0 standard drinks of alcohol per week. He reports that he has current or past drug history. Drug: Cocaine.   History reviewed. No pertinent family history.   No Known Allergies   Prior to Admission medications   Not on File    Review of Systems:  Constitutional:  No weight loss, night sweats, Fevers, chills, fatigue.  Head&Eyes: No headache.  No vision loss.  No eye pain or scotoma ENT:  No Difficulty swallowing,Tooth/dental problems,Sore throat,  No ear ache, post nasal drip,  Cardio-vascular:  No chest pain, Orthopnea, PND, swelling in lower extremities,  dizziness, palpitations  GI:  No  abdominal pain, nausea, vomiting, diarrhea, loss of appetite, hematochezia, melena, heartburn, indigestion, Resp:  No shortness of breath with exertion or at rest.  No cough. No coughing up of blood .No wheezing.No chest wall deformity  Skin:  no rash or lesions.  GU:  no dysuria, change in color of urine, no urgency or frequency. No flank pain.  Musculoskeletal:  No  joint pain or swelling. No decreased range of motion. No back pain.  Psych:  No change in mood or affect. No depression or anxiety. Neurologic: No headache, no dysesthesia,no vision loss. No syncope  Physical Exam: Vitals:   07/12/18 0906 07/12/18 0908 07/12/18 0930 07/12/18 1000  BP:  (!) 163/104 (!) 162/108 (!) 163/88  Pulse:  86 79 76  Resp:  18 (!) 21 20  Temp:  98 F (36.7 C)    TempSrc:  Oral    SpO2:  100% 98% 99%  Weight: 65.8 kg     Height: 5\' 8"  (1.727 m)      General:  A&O x 3, NAD, nontoxic, pleasant/cooperative Head/Eye: No conjunctival hemorrhage, no icterus, [place]/AT, No nystagmus ENT:  No icterus,  No thrush, good dentition, no pharyngeal exudate Neck:  No masses, no lymphadenpathy, no bruits CV:  RRR, no rub, no gallop, no S3 Lung:  CTAB, good air movement, no wheeze, no rhonchi Abdomen: soft/NT, +BS, nondistended, no peritoneal signs Ext: No cyanosis, No rashes, No petechiae, No lymphangitis, No edema Neuro: CNII-XII intact, strength 5/5 in LUE and LLE and 4- /5 RLE, RUE extremities, no dysmetria  Labs on Admission:  Basic Metabolic Panel: Recent Labs  Lab 07/12/18 0925  NA 137  K 4.1  CL 107  CO2 20*  GLUCOSE 100*  BUN 12  CREATININE 0.91  CALCIUM 8.8*   Liver Function Tests: Recent Labs  Lab 07/12/18 0925  AST 18  ALT 30  ALKPHOS 88  BILITOT 0.5  PROT 8.0  ALBUMIN 4.0   No results for input(s): LIPASE, AMYLASE in the last 168 hours. No results for input(s): AMMONIA in the last 168 hours. CBC: Recent Labs  Lab 07/12/18 0925  WBC 5.4  NEUTROABS 3.6  HGB 14.0  HCT 44.1  MCV 87.0  PLT 269   Coagulation Profile: Recent Labs  Lab 07/12/18 0925  INR 1.02   Cardiac Enzymes: No results for input(s): CKTOTAL, CKMB, CKMBINDEX, TROPONINI in the last 168 hours. BNP: Invalid input(s): POCBNP CBG: Recent Labs  Lab 07/12/18 1005  GLUCAP 86   Urine analysis:    Component Value Date/Time   COLORURINE YELLOW 03/03/2012 0205    APPEARANCEUR CLEAR 03/03/2012 0205   LABSPEC 1.020 03/03/2012 0205   PHURINE 7.0 03/03/2012 0205   GLUCOSEU NEGATIVE 03/03/2012 0205   HGBUR TRACE (A) 03/03/2012 0205   BILIRUBINUR NEGATIVE 03/03/2012 0205   KETONESUR NEGATIVE 03/03/2012 0205   PROTEINUR NEGATIVE 03/03/2012 0205   UROBILINOGEN 0.2 03/03/2012 0205   NITRITE NEGATIVE 03/03/2012 0205   LEUKOCYTESUR NEGATIVE 03/03/2012 0205   Sepsis Labs: @LABRCNTIP (procalcitonin:4,lacticidven:4) )No results found for this or any previous visit (from the past 240 hour(s)).   Radiological Exams on Admission: Ct Angio Head W Or Wo Contrast  Result Date: 07/12/2018 CLINICAL DATA:  Right arm weakness since 07/11/2018 EXAM: CT ANGIOGRAPHY HEAD AND NECK TECHNIQUE: Multidetector CT imaging of the head and neck was performed using the standard protocol during bolus administration of intravenous contrast. Multiplanar CT image reconstructions and MIPs were obtained to evaluate the vascular anatomy. Carotid stenosis measurements (when applicable) are obtained utilizing NASCET criteria, using the distal internal carotid diameter as the denominator. CONTRAST:  156mL ISOVUE-370 IOPAMIDOL (ISOVUE-370) INJECTION 76% COMPARISON:  Noncontrast head CT  from earlier today FINDINGS: CTA NECK FINDINGS Aortic arch: Limited coverage is negative. Brachiocephalic origin is not seen. Right carotid system: Mild narrowing of the distal ICA due to tortuosity and kinking. Mild non ulcerated plaque about the bifurcation. Left carotid system: Mild calcified plaque at the ICA bulb. No stenosis or ulceration. Vertebral arteries: No proximal subclavian stenosis or ulceration. Skeleton: Left dominant vertebral artery. Mild calcified plaque at the right vertebral origin. No flow limiting stenosis in the vertebrals or subclavians. Other neck: 11 mm left thyroid nodule, partially cystic, below size threshold for sonographic follow-up per consensus guidelines. Upper chest: Mild centrilobular  emphysema. Review of the MIP images confirms the above findings CTA HEAD FINDINGS Anterior circulation: Calcified plaque on the carotid siphons. No branch occlusion or flow limiting stenosis. Negative for aneurysm. Posterior circulation: Vessels are smooth and widely patent. Fenestration of the proximal basilar. Venous sinuses: Patent Anatomic variants: As above Delayed phase: No abnormal intracranial enhancement. Multiple remote small vessel infarcts. Review of the MIP images confirms the above findings IMPRESSION: 1. No emergent finding. 2. Mild atherosclerosis in the neck without stenosis or ulceration. 3. Multiple lacunar infarcts. 4.  Emphysema (ICD10-J43.9). Electronically Signed   By: Monte Fantasia M.D.   On: 07/12/2018 11:10   Ct Head Wo Contrast  Result Date: 07/12/2018 CLINICAL DATA:  Right-sided weakness and right facial droop EXAM: CT HEAD WITHOUT CONTRAST TECHNIQUE: Contiguous axial images were obtained from the base of the skull through the vertex without intravenous contrast. COMPARISON:  None. FINDINGS: Brain: There is mild diffuse atrophy for age. There is no intracranial mass, hemorrhage, extra-axial fluid collection, or midline shift. There is mild small vessel disease in the centra semiovale bilaterally. There is small vessel disease with clear infarcts in the anterior limbs of the left internal and external capsules. There is a lacunar infarct in the anterior superior left thalamus. There is a small lacunar infarct in the anterior most aspect of the right thalamus. There are age uncertain small infarcts in each lower midbrain-upper pons junction. There is a focus of decreased attenuation in the inferior left centrum semiovale at the level of the left frontal horn of the lateral ventricle which appears more prominent than other areas of small vessel disease and may represent a recent white matter infarct in this area. Vascular: No appreciable hyperdense vessel is demonstrable on this study.  There is calcification in each carotid siphon region. Skull: Bony calvarium appears intact. Sinuses/Orbits: There is mild mucosal thickening in several ethmoid air cells. Other visualized paranasal sinuses are clear. Visualized orbits appear symmetric bilaterally. Other: Mastoid air cells are clear. IMPRESSION: Atrophy with periventricular small vessel disease. Prior lacunar infarcts in the left basal ganglia and bilateral thalami. Age uncertain infarcts in the lower midbrain-upper pons junction, more notable on the left than on the right. Question recent small infarct in the white matter of the inferior left centrum semiovale at the frontal horn left lateral ventricle level. No mass or hemorrhage. Foci of arterial vascular calcification noted. Mucosal thickening noted in several ethmoid air cells. Electronically Signed   By: Lowella Grip III M.D.   On: 07/12/2018 09:39   Ct Angio Neck W And/or Wo Contrast  Result Date: 07/12/2018 CLINICAL DATA:  Right arm weakness since 07/11/2018 EXAM: CT ANGIOGRAPHY HEAD AND NECK TECHNIQUE: Multidetector CT imaging of the head and neck was performed using the standard protocol during bolus administration of intravenous contrast. Multiplanar CT image reconstructions and MIPs were obtained to evaluate the vascular anatomy. Carotid  stenosis measurements (when applicable) are obtained utilizing NASCET criteria, using the distal internal carotid diameter as the denominator. CONTRAST:  147mL ISOVUE-370 IOPAMIDOL (ISOVUE-370) INJECTION 76% COMPARISON:  Noncontrast head CT from earlier today FINDINGS: CTA NECK FINDINGS Aortic arch: Limited coverage is negative. Brachiocephalic origin is not seen. Right carotid system: Mild narrowing of the distal ICA due to tortuosity and kinking. Mild non ulcerated plaque about the bifurcation. Left carotid system: Mild calcified plaque at the ICA bulb. No stenosis or ulceration. Vertebral arteries: No proximal subclavian stenosis or  ulceration. Skeleton: Left dominant vertebral artery. Mild calcified plaque at the right vertebral origin. No flow limiting stenosis in the vertebrals or subclavians. Other neck: 11 mm left thyroid nodule, partially cystic, below size threshold for sonographic follow-up per consensus guidelines. Upper chest: Mild centrilobular emphysema. Review of the MIP images confirms the above findings CTA HEAD FINDINGS Anterior circulation: Calcified plaque on the carotid siphons. No branch occlusion or flow limiting stenosis. Negative for aneurysm. Posterior circulation: Vessels are smooth and widely patent. Fenestration of the proximal basilar. Venous sinuses: Patent Anatomic variants: As above Delayed phase: No abnormal intracranial enhancement. Multiple remote small vessel infarcts. Review of the MIP images confirms the above findings IMPRESSION: 1. No emergent finding. 2. Mild atherosclerosis in the neck without stenosis or ulceration. 3. Multiple lacunar infarcts. 4.  Emphysema (ICD10-J43.9). Electronically Signed   By: Monte Fantasia M.D.   On: 07/12/2018 11:10    EKG: Independently reviewed. Sinus nonspecific T wave changes    Time spent:60 minutes Code Status:   FULL Family Communication:  Mother updated at bedside Disposition Plan: expect 1-2 day hospitalization Consults called: neurology DVT Prophylaxis: [place] Lovenox  Orson Eva, DO  [facility] Pager [phone]  If 7PM-7AM, please contact night-coverage [email or web address] Password [email or web address] 07/12/2018, 11:16 AM

## 2018-07-12 NOTE — ED Provider Notes (Signed)
[facility] EMERGENCY DEPARTMENT Provider Note   CSN: [id number] Arrival date & time: 07/12/18  4270     History   Chief Complaint Chief Complaint  Patient presents with  . Weakness    HPI Evan Chavez is a 57 y.o. male.  HPI  57 year old male presents with acute right-sided weakness.  Started around midnight while he was working.  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  Has right arm and leg weakness as well as a right-sided facial droop and slurred speech.  Denies headache or blurred vision.  He smokes but denies any significant past medical history.  No chest pain.  History reviewed. No pertinent past medical history.  There are no active problems to display for this patient.   History reviewed. No pertinent surgical history.      Home Medications    Prior to Admission medications   Not on File    Family History History reviewed. No pertinent family history.  Social History Social History   Tobacco Use  . Smoking status: Current Every Day Smoker    Packs/day: 0.50    Types: Cigarettes  . Smokeless tobacco: Never Used  Substance Use Topics  . Alcohol use: Yes    Alcohol/week: 2.0 standard drinks    Types: 2 Cans of beer per week    Comment: weekly  . Drug use: Yes    Types: Cocaine    Comment: denies 07/12/18     Allergies   Patient has no known allergies.   Review of Systems Review of Systems  Eyes: Negative for visual disturbance.  Respiratory: Negative for shortness of breath.   Cardiovascular: Negative for chest pain.  Neurological: Positive for speech difficulty and weakness. Negative for headaches.  All other systems reviewed and are negative.    Physical Exam Updated Vital Signs BP (!) 163/88   Pulse 76   Temp 98 F (36.7 C) (Oral)   Resp 20   Ht 5\' 8"  (1.727 m)   Wt 65.8 kg   SpO2 99%   BMI 22.05 kg/m   Physical Exam  Constitutional: He appears  well-developed and well-nourished. No distress.  HENT:  Head: Normocephalic and atraumatic.  Right Ear: External ear normal.  Left Ear: External ear normal.  Nose: Nose normal.  Eyes: Pupils are equal, round, and reactive to light. EOM are normal. Right eye exhibits no discharge. Left eye exhibits no discharge.  No visual field deficit  Neck: Neck supple.  Cardiovascular: Normal rate, regular rhythm and normal heart sounds.  Pulmonary/Chest: Effort normal and breath sounds normal.  Abdominal: Soft. There is no tenderness.  Musculoskeletal: He exhibits no edema.  Neurological: He is alert.  Right sided facial droop. LUE, LLE 5/5. RUE 3/5 with significant drift. Right lower extremity 3/5.  Skin: Skin is warm and dry. He is not diaphoretic.  Psychiatric: His mood appears not anxious. His speech is slurred.  Nursing note and vitals reviewed.    ED Treatments / Results  Labs (all labs ordered are listed, but only abnormal results are displayed) Labs Reviewed  COMPREHENSIVE METABOLIC PANEL - Abnormal; Notable for the following components:      Result Value   CO2 20 (*)    Glucose, Bld 100 (*)    Calcium 8.8 (*)    All other components within normal limits  ETHANOL  PROTIME-INR  APTT  CBC  DIFFERENTIAL  RAPID URINE DRUG SCREEN,  HOSP PERFORMED  URINALYSIS, ROUTINE W REFLEX MICROSCOPIC  I-STAT TROPONIN, ED  CBG MONITORING, ED    EKG EKG Interpretation  Date/Time:  Monday July 12 2018 09:05:47 EDT Ventricular Rate:  83 PR Interval:    QRS Duration: 87 QT Interval:  375 QTC Calculation: 441 R Axis:   72 Text Interpretation:  Sinus rhythm Left ventricular hypertrophy Anterior infarct, old No old tracing to compare Confirmed by Sherwood Gambler [phone]) on 07/12/2018 9:14:24 AM   Radiology Ct Head Wo Contrast  Result Date: 07/12/2018 CLINICAL DATA:  Right-sided weakness and right facial droop EXAM: CT HEAD WITHOUT CONTRAST TECHNIQUE: Contiguous axial images were  obtained from the base of the skull through the vertex without intravenous contrast. COMPARISON:  None. FINDINGS: Brain: There is mild diffuse atrophy for age. There is no intracranial mass, hemorrhage, extra-axial fluid collection, or midline shift. There is mild small vessel disease in the centra semiovale bilaterally. There is small vessel disease with clear infarcts in the anterior limbs of the left internal and external capsules. There is a lacunar infarct in the anterior superior left thalamus. There is a small lacunar infarct in the anterior most aspect of the right thalamus. There are age uncertain small infarcts in each lower midbrain-upper pons junction. There is a focus of decreased attenuation in the inferior left centrum semiovale at the level of the left frontal horn of the lateral ventricle which appears more prominent than other areas of small vessel disease and may represent a recent white matter infarct in this area. Vascular: No appreciable hyperdense vessel is demonstrable on this study. There is calcification in each carotid siphon region. Skull: Bony calvarium appears intact. Sinuses/Orbits: There is mild mucosal thickening in several ethmoid air cells. Other visualized paranasal sinuses are clear. Visualized orbits appear symmetric bilaterally. Other: Mastoid air cells are clear. IMPRESSION: Atrophy with periventricular small vessel disease. Prior lacunar infarcts in the left basal ganglia and bilateral thalami. Age uncertain infarcts in the lower midbrain-upper pons junction, more notable on the left than on the right. Question recent small infarct in the white matter of the inferior left centrum semiovale at the frontal horn left lateral ventricle level. No mass or hemorrhage. Foci of arterial vascular calcification noted. Mucosal thickening noted in several ethmoid air cells. Electronically Signed   By: Lowella Grip III M.D.   On: 07/12/2018 09:39    Procedures Procedures  (including critical care time)  Medications Ordered in ED Medications  iopamidol (ISOVUE-370) 76 % injection 100 mL (100 mLs Intravenous Contrast Given 07/12/18 1024)     Initial Impression / Assessment and Plan / ED Course  I have reviewed the triage vital signs and the nursing notes.  Pertinent labs & imaging results that were available during my care of the patient were reviewed by me and considered in my medical decision making (see chart for details).  Clinical Course as of Jul 12 1041  Mon Jul 12, 2018  0913 Patient symptoms are consistent with a stroke.  However he is well outside the TPA window and does not have any other findings that would suggest large vessel occlusion.  He has trouble speaking but it is slurred speech and not expressive aphasia or receptive aphasia.  Will proceed with stroke work-up with CT head and labs.   [SG]    Clinical Course User Index [SG] Sherwood Gambler, MD    Presentation is consistent with a stroke.  I discussed with neurology, Dr. Merlene Laughter, who asks for CT angiography of head  and neck to evaluate for vessels.  Hospitalist will admit.  Of note, the patient does endorse cocaine abuse, which he states he most recently used 3 days ago but his daughter states he uses nearly every day.  Final Clinical Impressions(s) / ED Diagnoses   Final diagnoses:  Ischemic stroke [facility]    ED Discharge Orders    None       Sherwood Gambler, MD 07/12/18 1042

## 2018-07-13 ENCOUNTER — Encounter (HOSPITAL_COMMUNITY): Payer: Self-pay

## 2018-07-13 ENCOUNTER — Inpatient Hospital Stay (HOSPITAL_COMMUNITY): Payer: Medicaid Other

## 2018-07-13 ENCOUNTER — Observation Stay (HOSPITAL_COMMUNITY): Payer: Medicaid Other

## 2018-07-13 DIAGNOSIS — R29810 Facial weakness: Secondary | ICD-10-CM | POA: Diagnosis present

## 2018-07-13 DIAGNOSIS — I110 Hypertensive heart disease with heart failure: Secondary | ICD-10-CM | POA: Diagnosis present

## 2018-07-13 DIAGNOSIS — R471 Dysarthria and anarthria: Secondary | ICD-10-CM | POA: Diagnosis present

## 2018-07-13 DIAGNOSIS — I471 Supraventricular tachycardia: Secondary | ICD-10-CM | POA: Diagnosis present

## 2018-07-13 DIAGNOSIS — F17210 Nicotine dependence, cigarettes, uncomplicated: Secondary | ICD-10-CM | POA: Diagnosis present

## 2018-07-13 DIAGNOSIS — I5032 Chronic diastolic (congestive) heart failure: Secondary | ICD-10-CM | POA: Diagnosis present

## 2018-07-13 DIAGNOSIS — I6381 Other cerebral infarction due to occlusion or stenosis of small artery: Secondary | ICD-10-CM | POA: Diagnosis not present

## 2018-07-13 DIAGNOSIS — G8191 Hemiplegia, unspecified affecting right dominant side: Secondary | ICD-10-CM | POA: Diagnosis present

## 2018-07-13 DIAGNOSIS — R29710 NIHSS score 10: Secondary | ICD-10-CM | POA: Diagnosis present

## 2018-07-13 DIAGNOSIS — F1010 Alcohol abuse, uncomplicated: Secondary | ICD-10-CM | POA: Diagnosis present

## 2018-07-13 DIAGNOSIS — E785 Hyperlipidemia, unspecified: Secondary | ICD-10-CM | POA: Diagnosis present

## 2018-07-13 DIAGNOSIS — Z720 Tobacco use: Secondary | ICD-10-CM

## 2018-07-13 DIAGNOSIS — F1410 Cocaine abuse, uncomplicated: Secondary | ICD-10-CM | POA: Diagnosis present

## 2018-07-13 DIAGNOSIS — I5030 Unspecified diastolic (congestive) heart failure: Secondary | ICD-10-CM

## 2018-07-13 LAB — LIPID PANEL
Cholesterol: 124 mg/dL (ref 0–200)
HDL: 31 mg/dL — ABNORMAL LOW (ref >40)
LDL Cholesterol: 77 mg/dL (ref 0–99)
Total CHOL/HDL Ratio: 4 RATIO
Triglycerides: 82 mg/dL (ref <150)
VLDL: 16 mg/dL (ref 0–40)

## 2018-07-13 LAB — ECHOCARDIOGRAM COMPLETE
Height: 68 in
WEIGHTICAEL: 2109.36 [oz_av]

## 2018-07-13 LAB — TSH: TSH: 0.889 u[IU]/mL (ref 0.350–4.500)

## 2018-07-13 LAB — VITAMIN B12: VITAMIN B 12: 431 pg/mL (ref 180–914)

## 2018-07-13 LAB — SEDIMENTATION RATE: SED RATE: 16 mm/h (ref 0–16)

## 2018-07-13 MED ORDER — DILTIAZEM HCL 30 MG PO TABS
15.0000 mg | ORAL_TABLET | Freq: Four times a day (QID) | ORAL | Status: DC
  Administered 2018-07-13 – 2018-07-15 (×10): 15 mg via ORAL
  Filled 2018-07-13 (×10): qty 1

## 2018-07-13 MED ORDER — ATORVASTATIN CALCIUM 10 MG PO TABS
10.0000 mg | ORAL_TABLET | Freq: Every day | ORAL | Status: DC
  Administered 2018-07-13 – 2018-07-15 (×3): 10 mg via ORAL
  Filled 2018-07-13 (×3): qty 1

## 2018-07-13 NOTE — Plan of Care (Signed)
  Problem: Acute Rehab PT Goals(only PT should resolve) Goal: Patient Will Transfer Sit To/From Stand Outcome: Progressing Flowsheets (Taken 07/13/2018 1234) Patient will transfer sit to/from stand: with min guard assist Goal: Pt Will Transfer Bed To Chair/Chair To Bed Outcome: Progressing Flowsheets (Taken 07/13/2018 1234) Pt will Transfer Bed to Chair/Chair to Bed: min guard assist Goal: Pt Will Ambulate Outcome: Progressing Flowsheets (Taken 07/13/2018 1234) Pt will Ambulate: 75 feet; with min guard assist; with rolling walker; with cane   12:35 PM, 07/13/18 Lonell Grandchild, MPT Physical Therapist with [facility] [phone] office [phone] mobile phone

## 2018-07-13 NOTE — Progress Notes (Addendum)
Inpatient Rehabilitation-Admissions Coordinator   Attempted to call patient and his mother multiple times to discuss CIR program and determine appropriateness. AC has been unable to talk with either person at this time. AC will continue to attempt to get in touch with patient.   Update 6:01PM: AC was able to speak to pt over the phone, who then referred me to his mother to discuss details of CIR. His mother is concerned about the financial aspect of CIR. AC recommended pt and his family discuss decision tonight and AC will follow up tomorrow to answer any further questions. AC has noted financial counselor involved already.   Jhonnie Garner, OTR/L  Rehab Admissions Coordinator  [phone] 07/13/2018 4:16 PM

## 2018-07-13 NOTE — Progress Notes (Addendum)
PROGRESS NOTE  Evan Chavez ZTI:[id number] DOB: 1961/09/22 DOA: 07/12/2018 PCP: Patient, No Pcp Per  Brief History:  57 y.o. male with medical history of hypertension and polysubstance abuse presenting with right hemiparesis and dysarthria that began around 11:30 PM on 07/11/2018 while the patient was at work.  The patient works as a Curator, and he noticed some weakness in his right upper extremity and subsequently right lower extremity while he was at work.  Later, the patient developed some dysarthria.  He did not think anything of it and subsequently finished his shift and went home.  He woke up in the morning of 07/12/2018 with persistent symptoms and right facial droop.  As result, the patient presented to emergency department for further evaluation.  Assessment/Plan: Acute ischemic stroke -Neurology Consult -PT/OT evaluation--->CIR recommended -Speech therapy eval-->dysphagia 3 with thin -CT brain--CT of the brain showed prior lacunar strokes in the basal ganglia and bilateral thalami with age uncertain infarcts of the left pons/midbrain junction.   -MRI brain--acute infarct L-centrum ovale and right internal capsule -CTA H&N--no hemodynamically significant stenosis -Echo--pending -LDL--77 -HbA1C--pending -Antiplatelet--ASA 325 mg   SVT/Atrial Tachycardia -07/12/18 evening--telemetry showed atrial tachycardia HR 130-140 -start low dose diltiazem and titrate up  Essential Hypertension -allowed for permissive HTN initially -has not seen a physician x 10 years  Dysplidemia -start atorvastatin  Polysubstance abuse -tobacco and cocaine -I have discussed tobacco cessation with the patient.  I have counseled the patient regarding the negative impacts of continued tobacco use including but not limited to lung cancer, COPD, and cardiovascular disease.  I have discussed alternatives to tobacco and modalities that may help facilitate tobacco cessation including but not limited  to biofeedback, hypnosis, and medications.  Total time spent with tobacco counseling was 4 minutes.       Disposition Plan:  CIR vs home Family Communication:   Mother updated at bedside  Consultants:  neurology  Code Status:  FULL / DNR  DVT Prophylaxis:  [place] Heparin / [place] Lovenox   Procedures: As Listed in Progress Note Above  Antibiotics: None    Subjective: Pt c/o right arm and leg weakness.  Feels his speech is a little better.  No cp, sob, n/v/d, abd pain, headache  Objective: Vitals:   07/12/18 2347 07/13/18 0144 07/13/18 0347 07/13/18 0544  BP: (!) 178/115 (!) 153/116 (!) 167/110 (!) 145/101  Pulse: 95 90 89 78  Resp: 18 16 18 16   Temp: 98.4 F (36.9 C) 98.3 F (36.8 C) 97.8 F (36.6 C) 98.2 F (36.8 C)  TempSrc: Oral Oral Oral Oral  SpO2: 98% 100% 100% 99%  Weight:      Height:        Intake/Output Summary (Last 24 hours) at 07/13/2018 1254 Last data filed at 07/13/2018 8250 Gross per 24 hour  Intake 106.19 ml  Output -  Net 106.19 ml   Weight change:  Exam:   General:  Pt is alert, follows commands appropriately, not in acute distress  HEENT: No icterus, No thrush, No neck mass, [place]/AT  Cardiovascular: RRR, S1/S2, no rubs, no gallops  Respiratory: diminished breath sounds without wheeze  Abdomen: Soft/+BS, non tender, non distended, no guarding  Extremities: No edema, No lymphangitis, No petechiae, No rashes, no synovitis   Data Reviewed: I have personally reviewed following labs and imaging studies Basic Metabolic Panel: Recent Labs  Lab 07/12/18 0925  NA 137  K 4.1  CL 107  CO2 20*  GLUCOSE 100*  BUN 12  CREATININE 0.91  CALCIUM 8.8*   Liver Function Tests: Recent Labs  Lab 07/12/18 0925  AST 18  ALT 30  ALKPHOS 88  BILITOT 0.5  PROT 8.0  ALBUMIN 4.0   No results for input(s): LIPASE, AMYLASE in the last 168 hours. No results for input(s): AMMONIA in the last 168 hours. Coagulation Profile: Recent Labs  Lab  07/12/18 0925  INR 1.02   CBC: Recent Labs  Lab 07/12/18 0925  WBC 5.4  NEUTROABS 3.6  HGB 14.0  HCT 44.1  MCV 87.0  PLT 269   Cardiac Enzymes: No results for input(s): CKTOTAL, CKMB, CKMBINDEX, TROPONINI in the last 168 hours. BNP: Invalid input(s): POCBNP CBG: Recent Labs  Lab 07/12/18 1005  GLUCAP 86   HbA1C: No results for input(s): HGBA1C in the last 72 hours. Urine analysis:    Component Value Date/Time   COLORURINE YELLOW 07/12/2018 0913   APPEARANCEUR CLEAR 07/12/2018 0913   LABSPEC >1.046 (H) 07/12/2018 0913   PHURINE 5.0 07/12/2018 0913   GLUCOSEU NEGATIVE 07/12/2018 0913   HGBUR SMALL (A) 07/12/2018 0913   BILIRUBINUR NEGATIVE 07/12/2018 0913   KETONESUR NEGATIVE 07/12/2018 0913   PROTEINUR NEGATIVE 07/12/2018 0913   UROBILINOGEN 0.2 03/03/2012 0205   NITRITE NEGATIVE 07/12/2018 0913   LEUKOCYTESUR NEGATIVE 07/12/2018 0913   Sepsis Labs: @LABRCNTIP (procalcitonin:4,lacticidven:4) )No results found for this or any previous visit (from the past 240 hour(s)).   Scheduled Meds: . aspirin  325 mg Oral Daily  . enoxaparin (LOVENOX) injection  40 mg Subcutaneous Q24H   Continuous Infusions: . sodium chloride 10 mL/hr at 07/13/18 5852    Procedures/Studies: Ct Angio Head W Or Wo Contrast  Result Date: 07/12/2018 CLINICAL DATA:  Right arm weakness since 07/11/2018 EXAM: CT ANGIOGRAPHY HEAD AND NECK TECHNIQUE: Multidetector CT imaging of the head and neck was performed using the standard protocol during bolus administration of intravenous contrast. Multiplanar CT image reconstructions and MIPs were obtained to evaluate the vascular anatomy. Carotid stenosis measurements (when applicable) are obtained utilizing NASCET criteria, using the distal internal carotid diameter as the denominator. CONTRAST:  174mL ISOVUE-370 IOPAMIDOL (ISOVUE-370) INJECTION 76% COMPARISON:  Noncontrast head CT from earlier today FINDINGS: CTA NECK FINDINGS Aortic arch: Limited  coverage is negative. Brachiocephalic origin is not seen. Right carotid system: Mild narrowing of the distal ICA due to tortuosity and kinking. Mild non ulcerated plaque about the bifurcation. Left carotid system: Mild calcified plaque at the ICA bulb. No stenosis or ulceration. Vertebral arteries: No proximal subclavian stenosis or ulceration. Skeleton: Left dominant vertebral artery. Mild calcified plaque at the right vertebral origin. No flow limiting stenosis in the vertebrals or subclavians. Other neck: 11 mm left thyroid nodule, partially cystic, below size threshold for sonographic follow-up per consensus guidelines. Upper chest: Mild centrilobular emphysema. Review of the MIP images confirms the above findings CTA HEAD FINDINGS Anterior circulation: Calcified plaque on the carotid siphons. No branch occlusion or flow limiting stenosis. Negative for aneurysm. Posterior circulation: Vessels are smooth and widely patent. Fenestration of the proximal basilar. Venous sinuses: Patent Anatomic variants: As above Delayed phase: No abnormal intracranial enhancement. Multiple remote small vessel infarcts. Review of the MIP images confirms the above findings IMPRESSION: 1. No emergent finding. 2. Mild atherosclerosis in the neck without stenosis or ulceration. 3. Multiple lacunar infarcts. 4.  Emphysema (ICD10-J43.9). Electronically Signed   By: Monte Fantasia M.D.   On: 07/12/2018 11:10   Dg Chest 2 View  Result Date: 07/12/2018 CLINICAL  DATA:  Acute Stroke symptoms EXAM: CHEST - 2 VIEW COMPARISON:  09/17/2011 FINDINGS: Background COPD/emphysema noted as before. Normal heart size and vascularity. Slight right hilar prominence appears to be secondary to slight rotation to the right. No focal pneumonia, collapse or consolidation. Negative for edema, effusion or pneumothorax. No acute osseous finding. IMPRESSION: COPD/emphysema.  Stable exam.  No superimposed acute process Electronically Signed   By: Jerilynn Mages.  Shick M.D.    On: 07/12/2018 20:45   Ct Head Wo Contrast  Result Date: 07/12/2018 CLINICAL DATA:  Right-sided weakness and right facial droop EXAM: CT HEAD WITHOUT CONTRAST TECHNIQUE: Contiguous axial images were obtained from the base of the skull through the vertex without intravenous contrast. COMPARISON:  None. FINDINGS: Brain: There is mild diffuse atrophy for age. There is no intracranial mass, hemorrhage, extra-axial fluid collection, or midline shift. There is mild small vessel disease in the centra semiovale bilaterally. There is small vessel disease with clear infarcts in the anterior limbs of the left internal and external capsules. There is a lacunar infarct in the anterior superior left thalamus. There is a small lacunar infarct in the anterior most aspect of the right thalamus. There are age uncertain small infarcts in each lower midbrain-upper pons junction. There is a focus of decreased attenuation in the inferior left centrum semiovale at the level of the left frontal horn of the lateral ventricle which appears more prominent than other areas of small vessel disease and may represent a recent white matter infarct in this area. Vascular: No appreciable hyperdense vessel is demonstrable on this study. There is calcification in each carotid siphon region. Skull: Bony calvarium appears intact. Sinuses/Orbits: There is mild mucosal thickening in several ethmoid air cells. Other visualized paranasal sinuses are clear. Visualized orbits appear symmetric bilaterally. Other: Mastoid air cells are clear. IMPRESSION: Atrophy with periventricular small vessel disease. Prior lacunar infarcts in the left basal ganglia and bilateral thalami. Age uncertain infarcts in the lower midbrain-upper pons junction, more notable on the left than on the right. Question recent small infarct in the white matter of the inferior left centrum semiovale at the frontal horn left lateral ventricle level. No mass or hemorrhage. Foci of  arterial vascular calcification noted. Mucosal thickening noted in several ethmoid air cells. Electronically Signed   By: Lowella Grip III M.D.   On: 07/12/2018 09:39   Ct Angio Neck W And/or Wo Contrast  Result Date: 07/12/2018 CLINICAL DATA:  Right arm weakness since 07/11/2018 EXAM: CT ANGIOGRAPHY HEAD AND NECK TECHNIQUE: Multidetector CT imaging of the head and neck was performed using the standard protocol during bolus administration of intravenous contrast. Multiplanar CT image reconstructions and MIPs were obtained to evaluate the vascular anatomy. Carotid stenosis measurements (when applicable) are obtained utilizing NASCET criteria, using the distal internal carotid diameter as the denominator. CONTRAST:  152mL ISOVUE-370 IOPAMIDOL (ISOVUE-370) INJECTION 76% COMPARISON:  Noncontrast head CT from earlier today FINDINGS: CTA NECK FINDINGS Aortic arch: Limited coverage is negative. Brachiocephalic origin is not seen. Right carotid system: Mild narrowing of the distal ICA due to tortuosity and kinking. Mild non ulcerated plaque about the bifurcation. Left carotid system: Mild calcified plaque at the ICA bulb. No stenosis or ulceration. Vertebral arteries: No proximal subclavian stenosis or ulceration. Skeleton: Left dominant vertebral artery. Mild calcified plaque at the right vertebral origin. No flow limiting stenosis in the vertebrals or subclavians. Other neck: 11 mm left thyroid nodule, partially cystic, below size threshold for sonographic follow-up per consensus guidelines. Upper chest: Mild  centrilobular emphysema. Review of the MIP images confirms the above findings CTA HEAD FINDINGS Anterior circulation: Calcified plaque on the carotid siphons. No branch occlusion or flow limiting stenosis. Negative for aneurysm. Posterior circulation: Vessels are smooth and widely patent. Fenestration of the proximal basilar. Venous sinuses: Patent Anatomic variants: As above Delayed phase: No abnormal  intracranial enhancement. Multiple remote small vessel infarcts. Review of the MIP images confirms the above findings IMPRESSION: 1. No emergent finding. 2. Mild atherosclerosis in the neck without stenosis or ulceration. 3. Multiple lacunar infarcts. 4.  Emphysema (ICD10-J43.9). Electronically Signed   By: Monte Fantasia M.D.   On: 07/12/2018 11:10   Mr Brain Wo Contrast  Result Date: 07/13/2018 CLINICAL DATA:  Right-sided weakness.  Stroke EXAM: MRI HEAD WITHOUT CONTRAST TECHNIQUE: Multiplanar, multiecho pulse sequences of the brain and surrounding structures were obtained without intravenous contrast. COMPARISON:  CT head 07/12/2018 FINDINGS: Brain: Acute infarct in the left centrum semiovale measuring approximately 15 x 20 mm. 5 mm acute infarct in the genu internal capsule on the right. Extensive chronic microvascular ischemic changes throughout the white matter basal ganglia and pons. Negative for hemorrhage or mass. Mild atrophy without hydrocephalus. Vascular: Normal arterial flow voids. Skull and upper cervical spine: Negative Sinuses/Orbits: Mild mucosal edema paranasal sinuses.  Normal orbit Other: None IMPRESSION: Acute infarct left centrum semiovale. Small acute infarct genu internal capsule on the right. Extensive chronic microvascular ischemia. Electronically Signed   By: Franchot Gallo M.D.   On: 07/13/2018 08:50    Orson Eva, DO  [facility] Pager [phone]  If 7PM-7AM, please contact night-coverage [email or web address] Password [email or web address] 07/13/2018, 12:54 PM   LOS: 0 days

## 2018-07-13 NOTE — Evaluation (Addendum)
Occupational Therapy Evaluation Patient Details Name: Evan Chavez MRN: [id number] DOB: 12-28-1960 Today's Date: 07/13/2018    History of Present Illness 57 y/o male presents with acute right-sided weakness. 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 He did not drop his equipment but has been unable to use his arm significantly since.  Has right arm and leg weakness as well as a right-sided facial droop and slurred speech.  Denies headache or blurred vision.  He smokes but denies any significant past medical history.  No chest pain.   Clinical Impression    Pt presents seated on EOB, agreeable to see OT for evaluation. Prior to admission, pt living with sons and independent with ADLs while receiving IADL assistance from sons. He presents with right-sided weakness, right-sided facial droop and slurred speech without apparent symptoms of receptive or expressive aphasia. Pt with weak active extension at elbow, trace active movement in wrist/digits. Pt is able to flex right arm approximately 15 degrees and is able to activate trapezius. In standing, pt leans slightly to right side, able to recognize and correct with verbal cuing. Due to his young age and potential for rehabilitation, OT recommends CIR for this pt to improve independence and safety in ADL completion.      Follow Up Recommendations  CIR    Equipment Recommendations  None recommended by OT    Recommendations for Other Services Rehab consult     Precautions / Restrictions Precautions Precautions: Fall Restrictions Weight Bearing Restrictions: No      Mobility Bed Mobility Overal bed mobility: Modified Independent Bed Mobility: Supine to Sit;Sit to Supine     Supine to sit: Modified independent (Device/Increase time) Sit to supine: Modified independent (Device/Increase time)      Transfers Overall transfer level: Needs assistance Equipment used: 1 person hand held assist Transfers: Sit to/from  [facility] Sit to Stand: Min assist Stand pivot transfers: Min assist       General transfer comment: very unsteady on feet         ADL either performed or assessed with clinical judgement   ADL Overall ADL's : Needs assistance/impaired Eating/Feeding: Set up;Sitting Eating/Feeding Details (indicate cue type and reason): Pt requiring assistance due to inability to use right dominant extremity to assist with opening items Grooming: Set up;Sitting Grooming Details (indicate cue type and reason): Pt not able to use dominant RUE due to weakness Upper Body Bathing: Moderate assistance;Sitting Upper Body Bathing Details (indicate cue type and reason): Pt unable to use RUE to wash-needs assistance for left side, back, feet Lower Body Bathing: Moderate assistance;Sitting/lateral leans Lower Body Bathing Details (indicate cue type and reason): Pt unable to use RUE to wash-needs assistance for left side, back, feet Upper Body Dressing : Moderate assistance;Sitting Upper Body Dressing Details (indicate cue type and reason): pt unable to use RUE, requiring assistance with threading arm, fine motor requirements Lower Body Dressing: Moderate assistance;Sitting/lateral leans;Sit to/from stand Lower Body Dressing Details (indicate cue type and reason): pt unable to use RUE, requiring assistance with threading arm, fine motor requirements Toilet Transfer: Minimal assistance;+2 for physical assistance   Toileting- Clothing Manipulation and Hygiene: Minimal assistance;Moderate assistance;Sit to/from stand               Vision Baseline Vision/History: No visual deficits Patient Visual Report: No change from baseline Vision Assessment?: No apparent visual deficits            Pertinent Vitals/Pain Pain Assessment: No/denies pain  Hand Dominance Right   Extremity/Trunk Assessment Upper Extremity Assessment Upper Extremity Assessment: Defer to OT evaluation RUE  Deficits / Details: shoulder strength: 2-/5, elbow/wrist flexors 0/5, elbow extension 2-/5, unable to close or open fist actively. P/ROM is WNL RUE Sensation: WNL RUE Coordination: decreased fine motor;decreased gross motor   Lower Extremity Assessment Lower Extremity Assessment: Generalized weakness;RLE deficits/detail RLE Deficits / Details: grossly -3/5 except ankle dorsiflexion 2/5   Cervical / Trunk Assessment Cervical / Trunk Assessment: Normal   Communication Communication Communication: No difficulties   Cognition Arousal/Alertness: Awake/alert Behavior During Therapy: WFL for tasks assessed/performed Overall Cognitive Status: No family/caregiver present to determine baseline cognitive functioning                                 General Comments: Unclear whether or not pt is at cognitive baseline due to limited evaluation time and lack of family present.              Home Living Family/patient expects to be discharged to:: Private residence Living Arrangements: Children Available Help at Discharge: Family Type of Home: House Home Access: Ramped entrance Entrance Stairs-Number of Steps: 3 Entrance Stairs-Rails: Right;Left;Can reach both [place]: Able to live on main level with bedroom/bathroom     Bathroom Shower/Tub: Tub/shower unit     Bathroom Accessibility: Yes   Home Equipment: Other (comment)   Additional Comments: Pt reports having a ramp that ws used by his late wife. Unclear whether or not pt has kept other adaptive equipment that belonged to his wife.       Prior Functioning/Environment Level of Independence: Independent        Comments: community ambulator, does not drive        OT Problem List: Decreased strength;Decreased range of motion;Decreased activity tolerance;Decreased coordination;Decreased knowledge of use of DME or AE;Impaired UE functional use         OT Goals(Current goals can be found in the care plan  section) Acute Rehab OT Goals Patient Stated Goal: return home able to walk OT Goal Formulation: With patient Time For Goal Achievement: 07/27/18 Potential to Achieve Goals: Good ADL Goals Pt Will Perform Eating: with min assist;sitting Pt Will Perform Grooming: with min assist;sitting;standing Pt Will Perform Upper Body Dressing: with min assist;with caregiver independent in assisting;sitting Pt Will Perform Lower Body Dressing: with min assist;sitting/lateral leans;sit to/from stand Pt Will Transfer to Toilet: with supervision;stand pivot transfer;ambulating;regular height toilet;bedside commode Pt Will Perform Toileting - Clothing Manipulation and hygiene: with min guard assist;sitting/lateral leans;sit to/from stand Pt/caregiver will Perform Home Exercise Program: Increased strength;Right Upper extremity;With written HEP provided  OT Frequency: Min 2X/week    End of Session Equipment Utilized During Treatment: Gait belt;Other (comment)(W/C)  Activity Tolerance: Patient tolerated treatment well Patient left: Other (comment)(With staff taken to get scan)  OT Visit Diagnosis: Muscle weakness (generalized) (M62.81)                Time: 2671-2458 OT Time Calculation (min): 23 min Charges:  OT General Charges $OT Visit: 1 Visit OT Evaluation $OT Eval Low Complexity: [place], OTR/L  [phone] 07/13/2018, 3:44 PM

## 2018-07-13 NOTE — Consult Note (Signed)
[place] A. Merlene Laughter, MD     [email or web address]          Evan Chavez is an 57 y.o. male.   ASSESSMENT/PLAN: 1.  Acute right hemiplegia due to contralateral left corona radiata infarct.  Risk factors cocaine abuse, alcohol abuse, untreated hypertension, nicotine use and age.  Patient also has a small infarct on the contralateral side.  This raises the possibility of cardioembolic stroke however it is known that acute cocaine intoxication can present with bilateral infarcts.  I suspect this is most likely and cardioembolic less likely.  I did discuss with the patient the need to discontinue use of these addictive medications and drugs.  The patient has been appropriately started on aspirin.  Long-term blood pressure control is also needed.  Additional labs will be obtained for C-reactive protein, sed rate, homocystine level and RPR.  2.  Multiple small to medium infarcts and white matter changes.  The white matter changes are somewhat concerning for plaque-like lesion given the orientation perpendicular to the ventricle.  The patient will therefore be sent down for MRI with contrast.     Patient is a 57 year old right-handed black male who presents with the acute onset of severe right-sided weakness and dysarthria.  The patient does have a history of hypertension but has not seen a doctor many years and has not been treated for this.  The patient does not report having any other events of neurological symptoms in the past.  Reports being highly functional at baseline.  There is no reports of chest pain, loss of consciousness or other symptoms.  The review of systems otherwise negative.      GENERAL: This is a very pleasant thin male who is in no acute distress.  HEENT: Neck is supple.  No trauma appreciated.  ABDOMEN: soft  EXTREMITIES: No edema   BACK: Normal  SKIN: Normal by inspection.    MENTAL STATUS: Alert and oriented month but he states his age is 84.  Speech -moderately dysarthric; language (good comprehension, naming and fluency) and cognition are generally intact. Judgment and insight normal.   CRANIAL NERVES: Pupils are equal, round and reactive to light and accomodation; extra ocular movements are full, there is no significant nystagmus; visual fields are full; there is complete weakness of the right lower facial muscles with mild weakness of the frontalis on the right, there is no flattening of the nasolabial folds; tongue is midline; uvula is midline; shoulder elevation is normal.  MOTOR: Normal tone, bulk and strength involving the left upper and lower extremities; there is no drift on the left side.  The right upper extremity is 2/5 in the right lower extremity also 2/5 both with profound drift.  COORDINATION: Left finger to nose is normal, No rest tremor; no intention tremor; no postural tremor; no bradykinesia.  REFLEXES: Deep tendon reflexes are symmetrical and normal.   SENSATION: Normal to light touch, temperature, and pain.    NIH stroke scale 1, 1, 2, 3, 3 total 10.    Blood pressure (!) 148/93, pulse 76, temperature 97.8 F (36.6 C), temperature source Oral, resp. rate 16, height '5\' 8"'  (6.283 m), weight 59.8 kg, SpO2 99 %.  Past Medical History:  Diagnosis Date  . Cocaine abuse ([place])   . ETOH abuse     History reviewed. No pertinent surgical history.  History reviewed. No pertinent family history.  Social History:  reports that he has been smoking cigarettes. He has been smoking about 0.50 packs  per day. He has never used smokeless tobacco. He reports that he drinks about 2.0 standard drinks of alcohol per week. He reports that he has current or past drug history. Drug: Cocaine.  Allergies: No Known Allergies  Medications: Prior to Admission medications   Not on File    Scheduled Meds: . aspirin  325 mg Oral Daily  . atorvastatin  10 mg Oral q1800  . diltiazem  15 mg Oral Q6H  . enoxaparin (LOVENOX)  injection  40 mg Subcutaneous Q24H   Continuous Infusions: . sodium chloride 10 mL/hr at 07/13/18 0637   PRN Meds:.acetaminophen **OR** acetaminophen (TYLENOL) oral liquid 160 mg/5 mL **OR** acetaminophen, hydrALAZINE, ondansetron (ZOFRAN) IV, senna-docusate     Results for orders placed or performed during the hospital encounter of 07/12/18 (from the past 48 hour(s))  Urine rapid drug screen (hosp performed)     Status: Abnormal   Collection Time: 07/12/18  9:13 AM  Result Value Ref Range   Opiates NONE DETECTED NONE DETECTED   Cocaine POSITIVE (A) NONE DETECTED   Benzodiazepines NONE DETECTED NONE DETECTED   Amphetamines NONE DETECTED NONE DETECTED   Tetrahydrocannabinol NONE DETECTED NONE DETECTED   Barbiturates NONE DETECTED NONE DETECTED    Comment: (NOTE) DRUG SCREEN FOR MEDICAL PURPOSES ONLY.  IF CONFIRMATION IS NEEDED FOR ANY PURPOSE, NOTIFY LAB WITHIN 5 DAYS. LOWEST DETECTABLE LIMITS FOR URINE DRUG SCREEN Drug Class                     Cutoff (ng/mL) Amphetamine and metabolites    1000 Barbiturate and metabolites    200 Benzodiazepine                 364 Tricyclics and metabolites     300 Opiates and metabolites        300 Cocaine and metabolites        300 THC                            50 Performed at [facility], [address], [place], [place] [address]   Urinalysis, Routine w reflex microscopic     Status: Abnormal   Collection Time: 07/12/18  9:13 AM  Result Value Ref Range   Color, Urine YELLOW YELLOW   APPearance CLEAR CLEAR   Specific Gravity, Urine >1.046 (H) 1.005 - 1.030   pH 5.0 5.0 - 8.0   Glucose, UA NEGATIVE NEGATIVE mg/dL   Hgb urine dipstick SMALL (A) NEGATIVE   Bilirubin Urine NEGATIVE NEGATIVE   Ketones, ur NEGATIVE NEGATIVE mg/dL   Protein, ur NEGATIVE NEGATIVE mg/dL   Nitrite NEGATIVE NEGATIVE   Leukocytes, UA NEGATIVE NEGATIVE   RBC / HPF 11-20 0 - 5 RBC/hpf   WBC, UA 0-5 0 - 5 WBC/hpf   Bacteria, UA NONE SEEN NONE SEEN   Mucus  PRESENT     Comment: Performed at [facility], [address], [place], [place] [address]  Ethanol     Status: None   Collection Time: 07/12/18  9:25 AM  Result Value Ref Range   Alcohol, Ethyl (B) <10 <10 mg/dL    Comment: (NOTE) Lowest detectable limit for serum alcohol is 10 mg/dL. For medical purposes only. Performed at [facility], [address] [place], [place] [address]   Protime-INR     Status: None   Collection Time: 07/12/18  9:25 AM  Result Value Ref Range   Prothrombin Time 13.3 11.4 -  15.2 seconds   INR 1.02     Comment: Performed at [facility], [address], [place], [place] [address]  APTT     Status: None   Collection Time: 07/12/18  9:25 AM  Result Value Ref Range   aPTT 30 24 - 36 seconds    Comment: Performed at [facility], [address], [place], [place] [address]  CBC     Status: None   Collection Time: 07/12/18  9:25 AM  Result Value Ref Range   WBC 5.4 4.0 - 10.5 K/uL   RBC 5.07 4.22 - 5.81 MIL/uL   Hemoglobin 14.0 13.0 - 17.0 g/dL   HCT 44.1 39.0 - 52.0 %   MCV 87.0 78.0 - 100.0 fL   MCH 27.6 26.0 - 34.0 pg   MCHC 31.7 30.0 - 36.0 g/dL   RDW 13.6 11.5 - 15.5 %   Platelets 269 150 - 400 K/uL    Comment: Performed at [facility], [address] [place], [place] [address]  Differential     Status: None   Collection Time: 07/12/18  9:25 AM  Result Value Ref Range   Neutrophils Relative % 66 %   Neutro Abs 3.6 1.7 - 7.7 K/uL   Lymphocytes Relative 26 %   Lymphs Abs 1.4 0.7 - 4.0 K/uL   Monocytes Relative 6 %   Monocytes Absolute 0.3 0.1 - 1.0 K/uL   Eosinophils Relative 1 %   Eosinophils Absolute 0.1 0.0 - 0.7 K/uL   Basophils Relative 1 %   Basophils Absolute 0.0 0.0 - 0.1 K/uL    Comment: Performed at [facility], [address] [place], [place] [address]  Comprehensive metabolic panel     Status: Abnormal   Collection Time: 07/12/18  9:25 AM  Result Value Ref Range   Sodium 137 135 - 145 mmol/L   Potassium 4.1 3.5 - 5.1 mmol/L    Chloride 107 98 - 111 mmol/L   CO2 20 (L) 22 - 32 mmol/L   Glucose, Bld 100 (H) 70 - 99 mg/dL   BUN 12 6 - 20 mg/dL   Creatinine, Ser 0.91 0.61 - 1.24 mg/dL   Calcium 8.8 (L) 8.9 - 10.3 mg/dL   Total Protein 8.0 6.5 - 8.1 g/dL   Albumin 4.0 3.5 - 5.0 g/dL   AST 18 15 - 41 U/L   ALT 30 0 - 44 U/L   Alkaline Phosphatase 88 38 - 126 U/L   Total Bilirubin 0.5 0.3 - 1.2 mg/dL   GFR calc non Af Amer >60 >60 mL/min   GFR calc Af Amer >60 >60 mL/min    Comment: (NOTE) The eGFR has been calculated using the CKD EPI equation. This calculation has not been validated in all clinical situations. eGFR's persistently <60 mL/min signify possible Chronic Kidney Disease.    Anion gap 10 5 - 15    Comment: Performed at [facility], [address], [place], [place] [address]  I-stat troponin, ED     Status: None   Collection Time: 07/12/18  9:32 AM  Result Value Ref Range   Troponin i, poc 0.00 0.00 - 0.08 ng/mL   Comment 3            Comment: Due to the release kinetics of cTnI, a negative result within the first hours of the onset of symptoms does not rule out myocardial infarction with certainty. If myocardial infarction is still suspected, repeat the test at appropriate intervals.   CBG monitoring, ED  Status: None   Collection Time: 07/12/18 10:05 AM  Result Value Ref Range   Glucose-Capillary 86 70 - 99 mg/dL  Lipid panel     Status: Abnormal   Collection Time: 07/13/18  5:24 AM  Result Value Ref Range   Cholesterol 124 0 - 200 mg/dL   Triglycerides 82 <150 mg/dL   HDL 31 (L) >40 mg/dL   Total CHOL/HDL Ratio 4.0 RATIO   VLDL 16 0 - 40 mg/dL   LDL Cholesterol 77 0 - 99 mg/dL    Comment:        Total Cholesterol/HDL:CHD Risk Coronary Heart Disease Risk Table                     Men   Women  1/2 Average Risk   3.4   3.3  Average Risk       5.0   4.4  2 X Average Risk   9.6   7.1  3 X Average Risk  23.4   11.0        Use the calculated Patient Ratio above and the CHD Risk  Table to determine the patient's CHD Risk.        ATP III CLASSIFICATION (LDL):  <100     mg/dL   Optimal  100-129  mg/dL   Near or Above                    Optimal  130-159  mg/dL   Borderline  160-189  mg/dL   High  >190     mg/dL   Very High Performed at [facility] [place], [place] [address]     Studies/Results:  BRAIN MRI FINDINGS: Brain: Acute infarct in the left centrum semiovale measuring approximately 15 x 20 mm. 5 mm acute infarct in the genu internal capsule on the right.  Extensive chronic microvascular ischemic changes throughout the white matter basal ganglia and pons. Negative for hemorrhage or mass. Mild atrophy without hydrocephalus.  Vascular: Normal arterial flow voids.  Skull and upper cervical spine: Negative  Sinuses/Orbits: Mild mucosal edema paranasal sinuses.  Normal orbit  Other: None  IMPRESSION: Acute infarct left centrum semiovale. Small acute infarct genu internal capsule on the right.  Extensive chronic microvascular ischemia.      Head neck CTA: FINDINGS: CTA NECK FINDINGS  Aortic arch: Limited coverage is negative. Brachiocephalic origin is not seen.  Right carotid system: Mild narrowing of the distal ICA due to tortuosity and kinking. Mild non ulcerated plaque about the bifurcation.  Left carotid system: Mild calcified plaque at the ICA bulb. No stenosis or ulceration.  Vertebral arteries: No proximal subclavian stenosis or ulceration.  Skeleton: Left dominant vertebral artery. Mild calcified plaque at the right vertebral origin. No flow limiting stenosis in the vertebrals or subclavians.  Other neck: 11 mm left thyroid nodule, partially cystic, below size threshold for sonographic follow-up per consensus guidelines.  Upper chest: Mild centrilobular emphysema.  Review of the MIP images confirms the above findings  CTA HEAD FINDINGS  Anterior circulation: Calcified plaque on the  carotid siphons. No branch occlusion or flow limiting stenosis. Negative for aneurysm.  Posterior circulation: Vessels are smooth and widely patent. Fenestration of the proximal basilar.  Venous sinuses: Patent  Anatomic variants: As above  Delayed phase: No abnormal intracranial enhancement. Multiple remote small vessel infarcts.  Review of the MIP images confirms the above findings  IMPRESSION: 1. No emergent finding. 2. Mild atherosclerosis in the  neck without stenosis or ulceration. 3. Multiple lacunar infarcts.    TTE - Left ventricle: The cavity size was normal. Wall thickness was   increased in a pattern of mild LVH. Systolic function was normal.   The estimated ejection fraction was in the range of 60% to 65%.   Wall motion was normal; there were no regional wall motion   abnormalities. Doppler parameters are consistent with abnormal   left ventricular relaxation (grade 1 diastolic dysfunction). - Atrial septum: No defect or patent foramen ovale was identified.     The brain MRI is reviewed in person.  There is acute infarct involving the left centrum semiovale and right basal ganglia particular the caudate nuclei.  There are multiple areas of encephalomalacia small to moderate in size involving the thalamic nuclei, basal ganglia and centrum semiovale especially on the left side.  There is also similar findings involving the pontine nuclei bilaterally.  The findings seems most consistent with a remote small to moderate vessel infarcts.  There is also moderate amount of confluent leukoencephalopathy consistent with chronic microvascular changes.  No hemorrhages appreciated.   Meghen Akopyan A. Merlene Laughter, M.D.  Diplomate, Tax adviser of Psychiatry and Neurology ( Neurology). 07/13/2018, 5:19 PM

## 2018-07-13 NOTE — Evaluation (Signed)
Physical Therapy Evaluation Patient Details Name: Evan Chavez MRN: [id number] DOB: [birth year] Today's Date: 07/13/2018   History of Present Illness  57 y/o male presents with acute right-sided weakness. 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 He did not drop his equipment but has been unable to use his arm significantly since.  Has right arm and leg weakness as well as a right-sided facial droop and slurred speech.  Denies headache or blurred vision.  He smokes but denies any significant past medical history.  No chest pain.    Clinical Impression  Patient able to grip with right hand well enough to use RW during gait training, limited mostly due to RLE/right ankle dorsiflexor weakness resulting in decreased right heel strike and leaning with near fall when not using an AD.  Patient limited for ambulation due to c/o fatigue and tolerated sitting up in chair after therapy.  Patient will benefit from continued physical therapy in hospital and recommended venue below to increase strength, balance, endurance for safe ADLs and gait.    Follow Up Recommendations CIR;Supervision/Assistance - 24 hour;Supervision for mobility/OOB    Equipment Recommendations  Rolling walker with 5" wheels    Recommendations for Other Services       Precautions / Restrictions Precautions Precautions: Fall Restrictions Weight Bearing Restrictions: No      Mobility  Bed Mobility Overal bed mobility: Modified Independent                Transfers Overall transfer level: Needs assistance Equipment used: Rolling walker (2 wheeled);None Transfers: Sit to/from [employer] to Stand: Min assist Stand pivot transfers: Min assist       General transfer comment: very unsteady on feet with near loss of balance without AD, required use of RW for safety  Ambulation/Gait Ambulation/Gait assistance: Min assist;Mod assist Gait Distance (Feet): 35 Feet Assistive device:  Rolling walker (2 wheeled) Gait Pattern/deviations: Decreased step length - right;Decreased stance time - right;Decreased stride length Gait velocity: slow   General Gait Details: very unsteady with difficulty advancing RLE and near fall without AD, required use of RW and demonstrates slow labored cadence with mild difficulty holding onto RW with right hand due to weakness, limited heel strike RLE, but able to ambulate out side of room without losing balance with assistance  Stairs            Wheelchair Mobility    Modified Rankin (Stroke Patients Only)       Balance Overall balance assessment: Needs assistance Sitting-balance support: Feet supported;No upper extremity supported Sitting balance-Leahy Scale: Good     Standing balance support: No upper extremity supported;During functional activity Standing balance-Leahy Scale: Poor Standing balance comment: fair using RW                             Pertinent Vitals/Pain Pain Assessment: No/denies pain    Home Living Family/patient expects to be discharged to:: Private residence Living Arrangements: Children(2 sons) Available Help at Discharge: Family Type of Home: House Home Access: Stairs to enter Entrance Stairs-Rails: Right;Left;Can reach both Technical brewer of Steps: 3 Home Layout: One level        Prior Function Level of Independence: Independent         Comments: community ambulator, does not drive     Hand Dominance   Dominant Hand: Right    Extremity/Trunk Assessment   Upper Extremity Assessment Upper Extremity Assessment: Defer to  OT evaluation    Lower Extremity Assessment Lower Extremity Assessment: Generalized weakness;RLE deficits/detail RLE Deficits / Details: grossly -3/5 except ankle dorsiflexion 2/5    Cervical / Trunk Assessment Cervical / Trunk Assessment: Normal  Communication   Communication: No difficulties  Cognition Arousal/Alertness:  Awake/alert Behavior During Therapy: WFL for tasks assessed/performed Overall Cognitive Status: Within Functional Limits for tasks assessed                                        General Comments      Exercises     Assessment/Plan    PT Assessment Patient needs continued PT services  PT Problem List Decreased strength;Decreased activity tolerance;Decreased balance;Decreased mobility       PT Treatment Interventions Gait training;Stair training;Functional mobility training;Therapeutic activities;Therapeutic exercise;Patient/family education    PT Goals (Current goals can be found in the Care Plan section)  Acute Rehab PT Goals Patient Stated Goal: return home able to walk PT Goal Formulation: With patient Time For Goal Achievement: 07/27/18 Potential to Achieve Goals: Good    Frequency 7X/week   Barriers to discharge        Co-evaluation               AM-PAC PT "6 Clicks" Daily Activity  Outcome Measure Difficulty turning over in bed (including adjusting bedclothes, sheets and blankets)?: None Difficulty moving from lying on back to sitting on the side of the bed? : None Difficulty sitting down on and standing up from a chair with arms (e.g., wheelchair, bedside commode, etc,.)?: A Little Help needed moving to and from a bed to chair (including a wheelchair)?: A Lot Help needed walking in hospital room?: A Lot Help needed climbing 3-5 steps with a railing? : A Lot 6 Click Score: 17    End of Session Equipment Utilized During Treatment: Gait belt Activity Tolerance: Patient tolerated treatment well;Patient limited by fatigue Patient left: in chair;with call bell/phone within reach;with chair alarm set Nurse Communication: Mobility status PT Visit Diagnosis: Unsteadiness on feet (R26.81);Other abnormalities of gait and mobility (R26.89);Muscle weakness (generalized) (M62.81)    Time: 3276-1470 PT Time Calculation (min) (ACUTE ONLY): 27  min   Charges:   PT Evaluation $PT Eval Moderate Complexity: 1 Mod PT Treatments $Therapeutic Activity: 23-37 mins        12:31 PM, 07/13/18 Lonell Grandchild, MPT Physical Therapist with [facility] [phone] office [phone] mobile phone

## 2018-07-13 NOTE — Plan of Care (Signed)
  Problem: Acute Rehab OT Goals (only OT should resolve) Goal: Pt. Will Perform Eating Flowsheets (Taken 07/13/2018 1323) Pt Will Perform Eating: with min assist; sitting Goal: Pt. Will Perform Grooming Flowsheets (Taken 07/13/2018 1323) Pt Will Perform Grooming: with min assist; sitting; standing Goal: Pt. Will Perform Upper Body Dressing Flowsheets (Taken 07/13/2018 1323) Pt Will Perform Upper Body Dressing: with min assist; with caregiver independent in assisting; sitting Goal: Pt. Will Perform Lower Body Dressing Flowsheets (Taken 07/13/2018 1323) Pt Will Perform Lower Body Dressing: with min assist; sitting/lateral leans; sit to/from stand Goal: Pt. Will Transfer To Toilet Flowsheets (Taken 07/13/2018 1323) Pt Will Transfer to Toilet: with supervision; stand pivot transfer; ambulating; regular height toilet; bedside commode Goal: Pt. Will Perform Toileting-Clothing Manipulation Flowsheets (Taken 07/13/2018 1323) Pt Will Perform Toileting - Clothing Manipulation and hygiene: with min guard assist; sitting/lateral leans; sit to/from stand Goal: Pt/Caregiver Will Perform Home Exercise Program Flowsheets (Taken 07/13/2018 1323) Pt/caregiver will Perform Home Exercise Program: Increased strength; Right Upper extremity; With written HEP provided

## 2018-07-13 NOTE — Progress Notes (Signed)
*  PRELIMINARY RESULTS* Echocardiogram 2D Echocardiogram has been performed.  Leavy Cella 07/13/2018, 2:02 PM

## 2018-07-14 ENCOUNTER — Inpatient Hospital Stay (HOSPITAL_COMMUNITY): Payer: Medicaid Other

## 2018-07-14 DIAGNOSIS — I5032 Chronic diastolic (congestive) heart failure: Secondary | ICD-10-CM

## 2018-07-14 LAB — URINE CULTURE: CULTURE: NO GROWTH

## 2018-07-14 LAB — HIV ANTIBODY (ROUTINE TESTING W REFLEX): HIV Screen 4th Generation wRfx: NONREACTIVE

## 2018-07-14 LAB — C-REACTIVE PROTEIN: CRP: <0.8 mg/dL (ref <1.0)

## 2018-07-14 LAB — HEMOGLOBIN A1C
Hgb A1c MFr Bld: 5.5 % (ref 4.8–5.6)
Mean Plasma Glucose: 111 mg/dL

## 2018-07-14 MED ORDER — GADOBUTROL 1 MMOL/ML IV SOLN
7.5000 mL | Freq: Once | INTRAVENOUS | Status: AC | PRN
  Administered 2018-07-14: 7.5 mL via INTRAVENOUS

## 2018-07-14 NOTE — NC FL2 (Signed)
[place] MEDICAID FL2 LEVEL OF CARE SCREENING TOOL     IDENTIFICATION  Patient Name: Evan Chavez Birthdate: 1960-12-20 Sex: male Admission Date (Current Location): 07/12/2018  [facility] [place] Number:  [employer] and Address:  [place] [address], [place]      Provider Number: [id number]  Attending Physician Name and Address:  Barton Dubois, MD  Relative Name and Phone Number:       Current Level of Care: Hospital Recommended Level of Care: [place] Prior Approval Number:    Date Approved/Denied:   PASRR Number: [id number]  Discharge Plan: SNF    Current Diagnoses: Patient Active Problem List   Diagnosis Date Noted  . Atrial tachycardia ([place]) 07/13/2018  . Dyslipidemia 07/13/2018  . Acute ischemic stroke ([place]) 07/12/2018  . Essential hypertension 07/12/2018  . Tobacco abuse 07/12/2018  . Cocaine abuse ([place]) 07/12/2018  . Ischemic stroke ([place])     Orientation RESPIRATION BLADDER Height & Weight     Self, Time, Situation, Place  Normal Continent Weight: 131 lb 13.4 oz (59.8 kg) Height:  5\' 8"  (172.7 cm)  BEHAVIORAL SYMPTOMS/MOOD NEUROLOGICAL BOWEL NUTRITION STATUS      Continent Diet(DYS 3)  AMBULATORY STATUS COMMUNICATION OF NEEDS Skin     Verbally Normal                       Personal Care Assistance Level of Assistance              Functional Limitations Info  Sight, Hearing, Speech Sight Info: Adequate Hearing Info: Adequate Speech Info: Adequate    SPECIAL CARE FACTORS FREQUENCY  PT (By licensed PT), OT (By licensed OT), Speech therapy     PT Frequency: 5x/week OT Frequency: 3x/week     Speech Therapy Frequency: 3x/week      Contractures Contractures Info: Not present    Additional Factors Info  Code Status, Allergies Code Status Info: Full Code Allergies Info: NKA           Current Medications (07/14/2018):  This is the current hospital active medication  list Current Facility-Administered Medications  Medication Dose Route Frequency Provider Last Rate Last Dose  . 0.9 %  sodium chloride infusion   Intravenous Continuous Tat, David, MD 10 mL/hr at 07/14/18 1000    . acetaminophen (TYLENOL) tablet 650 mg  650 mg Oral Q4H PRN Tat, David, MD       Or  . acetaminophen (TYLENOL) solution 650 mg  650 mg Per Tube Q4H PRN Tat, Shanon Brow, MD       Or  . acetaminophen (TYLENOL) suppository 650 mg  650 mg Rectal Q4H PRN Tat, Shanon Brow, MD      . aspirin tablet 325 mg  325 mg Oral Daily Tat, David, MD   325 mg at 07/14/18 0835  . atorvastatin (LIPITOR) tablet 10 mg  10 mg Oral [id number] Orson Eva, MD   10 mg at 07/13/18 1855  . diltiazem (CARDIZEM) tablet 15 mg  15 mg Oral Q6H Orson Eva, MD   15 mg at 07/14/18 1128  . enoxaparin (LOVENOX) injection 40 mg  40 mg Subcutaneous Q24H Tat, David, MD   40 mg at 07/13/18 2127  . hydrALAZINE (APRESOLINE) injection 10 mg  10 mg Intravenous Q6H PRN Tat, Shanon Brow, MD      . ondansetron [facility]) injection 4 mg  4 mg Intravenous Q6H PRN Orson Eva, MD   4 mg at 07/12/18  2304  . senna-docusate (Senokot-S) tablet 1 tablet  1 tablet Oral QHS PRN Orson Eva, MD         Discharge Medications: Please see discharge summary for a list of discharge medications.  Relevant Imaging Results:  Relevant Lab Results:   Additional Information SSN [id number] Clydene Pugh, LCSW

## 2018-07-14 NOTE — Progress Notes (Signed)
Physical Therapy Treatment Patient Details Name: Evan Chavez MRN: [id number] DOB: 11-09-1960 Today's Date: 07/14/2018    History of Present Illness 57 y/o male presents with acute right-sided weakness. 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 He did not drop his equipment but has been unable to use his arm significantly since.  Has right arm and leg weakness as well as a right-sided facial droop and slurred speech.  Denies headache or blurred vision.  He smokes but denies any significant past medical history.  No chest pain.    PT Comments    Pt was seen for evaluation of mobility and strength, and has required assistance to maintain balance with RUE on walker.  His plan is to progress with mobility and will hopefully be returning to rehab setting in CIR, with return home thereafter.  Pt is motivated and cooperative, and will anticipate his success with all efforts to increase balance and strength on RLE.   Follow Up Recommendations  CIR;Supervision/Assistance - 24 hour;Supervision for mobility/OOB     Equipment Recommendations  Rolling walker with 5" wheels    Recommendations for Other Services       Precautions / Restrictions Precautions Precautions: Fall Restrictions Weight Bearing Restrictions: No    Mobility  Bed Mobility               General bed mobility comments: up in chair when PT arrived  Transfers Overall transfer level: Needs assistance Equipment used: 1 person hand held assist;Rolling walker (2 wheeled) Transfers: Sit to/from Stand Sit to Stand: Min assist         General transfer comment: RW with manual support for RUE whle standing and walking  Ambulation/Gait Ambulation/Gait assistance: Min assist Gait Distance (Feet): 35 Feet Assistive device: Rolling walker (2 wheeled);1 person hand held assist Gait Pattern/deviations: Step-through pattern;Step-to pattern;Decreased stride length;Decreased stance time - right Gait velocity:  slow       Stairs             Wheelchair Mobility    Modified Rankin (Stroke Patients Only) Modified Rankin (Stroke Patients Only) Pre-Morbid Rankin Score: Slight disability Modified Rankin: Moderate disability     Balance Overall balance assessment: Needs assistance Sitting-balance support: Feet supported Sitting balance-Leahy Scale: Good     Standing balance support: Bilateral upper extremity supported;During functional activity Standing balance-Leahy Scale: Poor Standing balance comment: RW required for stability standing                            Cognition Arousal/Alertness: Awake/alert Behavior During Therapy: WFL for tasks assessed/performed Overall Cognitive Status: Within Functional Limits for tasks assessed                                 General Comments: pt follows instructions slowly      Exercises General Exercises - Lower Extremity Ankle Circles/Pumps: AAROM;AROM;Both;10 reps Long Arc Quad: Strengthening;Both;10 reps Heel Slides: Strengthening;Both;10 reps Hip ABduction/ADduction: Strengthening;Both;10 reps Hip Flexion/Marching: AROM;AAROM;Both;10 reps    General Comments        Pertinent Vitals/Pain Pain Assessment: No/denies pain    Home Living                      Prior Function            PT Goals (current goals can now be found in the care plan section) Acute  Rehab PT Goals Patient Stated Goal: to walk Progress towards PT goals: Progressing toward goals    Frequency    7X/week      PT Plan Current plan remains appropriate    Co-evaluation              AM-PAC PT "6 Clicks" Daily Activity  Outcome Measure  Difficulty turning over in bed (including adjusting bedclothes, sheets and blankets)?: None Difficulty moving from lying on back to sitting on the side of the bed? : None Difficulty sitting down on and standing up from a chair with arms (e.g., wheelchair, bedside commode,  etc,.)?: Unable Help needed moving to and from a bed to chair (including a wheelchair)?: A Little Help needed walking in hospital room?: A Little Help needed climbing 3-5 steps with a railing? : A Lot 6 Click Score: 17    End of Session Equipment Utilized During Treatment: Gait belt Activity Tolerance: Patient tolerated treatment well Patient left: in chair;with call bell/phone within reach;with chair alarm set Nurse Communication: Mobility status PT Visit Diagnosis: Unsteadiness on feet (R26.81);Other abnormalities of gait and mobility (R26.89);Muscle weakness (generalized) (M62.81)     Time: 3646-8032 PT Time Calculation (min) (ACUTE ONLY): 21 min  Charges:  $Gait Training: 8-22 mins $Therapeutic Exercise: 8-22 mins                  Ramond Dial 07/14/2018, 8:16 PM    8:20 PM, 07/14/18 Mee Hives, PT, MS Physical Therapist - [place] [phone] (Office)

## 2018-07-14 NOTE — Care Management (Signed)
Patient/mother unable to afford to pay for CIR OOP. Son okay with going home with mother and have [facility]. CM discussed plan with mom who is unable to care for him physically. Mother will speak with [facility] and go apply for medicaid today. Dicussed with CSW who will follow.

## 2018-07-14 NOTE — Evaluation (Signed)
Speech Language Pathology Evaluation Patient Details Name: Evan Chavez MRN: [id number] DOB: Apr 16, 1961 Today's Date: 07/14/2018 Time: 1130-1200 SLP Time Calculation (min) (ACUTE ONLY): 30 min  Problem List:  Patient Active Problem List   Diagnosis Date Noted  . Atrial tachycardia ([place]) 07/13/2018  . Dyslipidemia 07/13/2018  . Acute ischemic stroke ([place]) 07/12/2018  . Essential hypertension 07/12/2018  . Tobacco abuse 07/12/2018  . Cocaine abuse ([place]) 07/12/2018  . Ischemic stroke [facility])    Past Medical History:  Past Medical History:  Diagnosis Date  . Cocaine abuse ([place])   . ETOH abuse    Past Surgical History: History reviewed. No pertinent surgical history. HPI:  57 y.o. male with medical history of hypertension and polysubstance abuse presenting with right hemiparesis and dysarthria that began around 11:30 PM on 07/11/2018 while the patient was at work.  The patient works as a Curator, and he noticed some weakness in his right upper extremity and subsequently right lower extremity while he was at work.  Later, the patient developed some dysarthria.  He did not think anything of it and subsequently finished his shift and went home.  He woke up in the morning of 07/12/2018 with persistent symptoms and right facial droop.  As result, the patient presented to emergency department for further evaluation. MRI shows: Acute infarct left centrum semiovale. Small acute infarct genu internal capsule on the right. Extensive chronic microvascular ischemia. SLE requested. Pt passed the RN swallow screen.   Assessment / Plan / Recommendation Clinical Impression  Pt presents with mild cognitive deficits with impairment noted in working an short term memory, however unsure if this is consistent with his baseline status and no family present to confirm. He also presents with mild/mod dysarthria negatively impacting speech intelligibility at the sentence and conversation level due to imprecise  articulation and low vocal intensity. Mr. Poland reports that he does not drive, he lives with his two sons, and is self employed as a Curator. His mother typically helps with his financial management. He indicates that he attended some college in [place] and studied business. He does not do much reading at home, but enjoys watching basketball and football ([employer] and [employer]). Pt denies difficulty swallowing and passed the RN swallow screen upon admission in the ED. Recommend SLP services via [facility] to address dysarthria and short term memory deficits (will need to determine baseline). SLP will sign off in the acute setting as all needs can be met at next venue of care.     SLP Assessment  SLP Recommendation/Assessment: All further Speech Lanaguage Pathology  needs can be addressed in the next venue of care SLP Visit Diagnosis: Dysarthria and anarthria (R47.1)    Follow Up Recommendations  Home health SLP    Frequency and Duration           SLP Evaluation Cognition  Overall Cognitive Status: Impaired/Different from baseline Arousal/Alertness: Awake/alert Orientation Level: Oriented X4 Memory: Impaired Memory Impairment: Retrieval deficit;Decreased short term memory Decreased Short Term Memory: Verbal basic(6/12 on 4-item recall (1/4)) Awareness: Appears intact Problem Solving: Appears intact Safety/Judgment: Appears intact [employer] Scales of Cognitive Functioning: No response       Comprehension  Auditory Comprehension Overall Auditory Comprehension: Appears within functional limits for tasks assessed Yes/No Questions: Within Functional Limits Commands: Within Functional Limits Conversation: Complex Visual Recognition/Discrimination Discrimination: Within Function Limits Reading Comprehension Reading Status: Not tested    Expression Expression Primary Mode of Expression: Verbal Verbal Expression Overall Verbal Expression: Appears within functional  limits for tasks  assessed Initiation: No impairment Automatic Speech: Name;Social Response;Month of year Level of Generative/Spontaneous Verbalization: Conversation Repetition: No impairment Naming: No impairment Pragmatics: No impairment Interfering Components: Speech intelligibility Non-Verbal Means of Communication: Not applicable Written Expression Dominant Hand: Right Written Expression: Not tested   Oral / Motor  Oral Motor/Sensory Function Overall Oral Motor/Sensory Function: Moderate impairment Facial ROM: Reduced right;Suspected CN VII (facial) dysfunction Facial Symmetry: Abnormal symmetry right Facial Strength: Reduced right Lingual ROM: Within Functional Limits Lingual Symmetry: Within Functional Limits Lingual Strength: Within Functional Limits Lingual Sensation: Within Functional Limits Velum: Within Functional Limits Mandible: Within Functional Limits Motor Speech Overall Motor Speech: Impaired Respiration: Within functional limits Phonation: Breathy Resonance: Within functional limits Articulation: Impaired Level of Impairment: Phrase Intelligibility: Intelligibility reduced Word: 75-100% accurate Phrase: 75-100% accurate Sentence: 75-100% accurate Conversation: 50-74% accurate Motor Planning: Witnin functional limits Motor Speech Errors: Aware;Unaware Effective Techniques: Slow rate;Increased vocal intensity;Over-articulate   Thank you,  Genene Churn, [place]                     [place] 07/14/2018, 12:21 PM

## 2018-07-14 NOTE — Clinical Social Work Placement (Signed)
   CLINICAL SOCIAL WORK PLACEMENT  NOTE  Date:  07/14/2018  Patient Details  Name: Evan Chavez MRN: [id number] Date of Birth: [birth year]  Clinical Social Work is seeking post-discharge placement for this patient at the [place] level of care (*CSW will initial, date and re-position this form in  chart as items are completed):  Yes   Patient/family provided with [facility] Work Department's list of facilities offering this level of care within the geographic area requested by the patient (or if unable, by the patient's family).  Yes   Patient/family informed of their freedom to choose among providers that offer the needed level of care, that participate in Medicare, Medicaid or managed care program needed by the patient, have an available bed and are willing to accept the patient.  Yes   Patient/family informed of [facility] [place]'s ownership interest in [facility] and [facility], as well as of the fact that they are under no obligation to receive care at these facilities.  PASRR submitted to EDS on 07/14/18     PASRR number received on 07/14/18     Existing PASRR number confirmed on       FL2 transmitted to all facilities in geographic area requested by pt/family on 07/14/18     FL2 transmitted to all facilities within larger geographic area on       Patient informed that his/her managed care company has contracts with or will negotiate with certain facilities, including the following:            Patient/family informed of bed offers received.  Patient chooses bed at       Physician recommends and patient chooses bed at      Patient to be transferred to   on  .  Patient to be transferred to facility by       Patient family notified on   of transfer.  Name of family member notified:        PHYSICIAN       Additional Comment:    _______________________________________________ Ihor Gully, LCSW 07/14/2018, 2:22 PM

## 2018-07-14 NOTE — Progress Notes (Signed)
PROGRESS NOTE  Evan Chavez QAS:[id number] DOB: 01-25-1961 DOA: 07/12/2018 PCP: Patient, No Pcp Per  Brief History:  57 y.o. male with medical history of hypertension and polysubstance abuse presenting with right hemiparesis and dysarthria that began around 11:30 PM on 07/11/2018 while the patient was at work.  The patient works as a Curator, and he noticed some weakness in his right upper extremity and subsequently right lower extremity while he was at work.  Later, the patient developed some dysarthria.  He did not think anything of it and subsequently finished his shift and went home.  He woke up in the morning of 07/12/2018 with persistent symptoms and right facial droop.  As result, the patient presented to emergency department for further evaluation.  Assessment/Plan: Acute ischemic stroke -Neurology Consult -PT/OT evaluation--->CIR recommended; patient has been declined and plan is to pursuit SNF. -Speech therapy eval-->continue SPL for language rehab and continue dysphagia 3 with thin -CT brain--CT of the brain showed prior lacunar strokes in the basal ganglia and bilateral thalami with age uncertain infarcts of the left pons/midbrain junction.   -MRI brain--acute infarct L-centrum ovale and right internal capsule -CTA H&N--no hemodynamically significant stenosis -Echo--no wall motion abnormalities, mild LVH, grade 1 diastolic dysfunction, ejection fraction 60 to 65%.  No signs of thrombi. -LDL--77 -HbA1C--5.5 -Antiplatelet--continue ASA 325 mg for secondary prevention.  SVT/Atrial Tachycardia -HR stable -continue diltiazem  Essential Hypertension -allowed for permissive HTN initially -has not seen a physician x 10 years -continue cardizem for now -follow VS.  Dysplidemia -continue atorvastatin  Polysubstance abuse -including tobacco and cocaine -cessation counseling provided.   Chronic diastolic heart failure -Mild diastolic dysfunction appreciated on 2D  echo and most likely associated with chronic hypertension. -Low-sodium diet has been discussed with patient -We will focus on controlling blood pressure once out of the acute ischemic CVA setting. -Currently euvolemic.   Disposition Plan:  SNF Family Communication:   Mother updated at bedside  Consultants:  neurology  Code Status:  FULL Code  DVT Prophylaxis:  [place] Lovenox   Procedures: As Listed in Progress Note Above  Antibiotics: None    Subjective: Patient continue complaining of right upper extremity and right lower extremity weakness.  Denies chest pain, no shortness of breath, no nausea, no vomiting.  Objective: Vitals:   07/14/18 0745 07/14/18 1146 07/14/18 1545 07/14/18 1546  BP: (!) 142/91 (!) 155/105 (!) 151/106 (!) 153/100  Pulse: 78 74 78 78  Resp: 18 16 18    Temp: 98.1 F (36.7 C) 98.1 F (36.7 C) 98.5 F (36.9 C)   TempSrc: Oral Oral Oral   SpO2: 100% 99% 100%   Weight:      Height:        Intake/Output Summary (Last 24 hours) at 07/14/2018 1814 Last data filed at 07/14/2018 1000 Gross per 24 hour  Intake 381.91 ml  Output -  Net 381.91 ml   Weight change:  Exam:  General exam: Alert, awake, oriented x 3, afebrile, in no acute distress, denies chest pain, no shortness of breath.  Patient is following commands appropriately and denies nausea or vomiting. Respiratory system: Clear to auscultation. Respiratory effort normal. Cardiovascular system:RRR. No murmurs, rubs, gallops. Gastrointestinal system: Abdomen is nondistended, soft and nontender. No organomegaly or masses felt. Normal bowel sounds heard. Central nervous system: Alert and oriented.  Patient with right hemiparesis (affecting more upper extremity than lower extremity).  Mild dysarthria also appreciated. Extremities: No C/C/E, +pedal pulses  Skin: No rashes, lesions or ulcers Psychiatry: Judgement and insight appear normal.  Slightly flat affect appreciated on exam.   Data  Reviewed: I have personally reviewed following labs and imaging studies Basic Metabolic Panel: Recent Labs  Lab 07/12/18 0925  NA 137  K 4.1  CL 107  CO2 20*  GLUCOSE 100*  BUN 12  CREATININE 0.91  CALCIUM 8.8*   Liver Function Tests: Recent Labs  Lab 07/12/18 0925  AST 18  ALT 30  ALKPHOS 88  BILITOT 0.5  PROT 8.0  ALBUMIN 4.0   Coagulation Profile: Recent Labs  Lab 07/12/18 0925  INR 1.02   CBC: Recent Labs  Lab 07/12/18 0925  WBC 5.4  NEUTROABS 3.6  HGB 14.0  HCT 44.1  MCV 87.0  PLT 269   CBG: Recent Labs  Lab 07/12/18 1005  GLUCAP 86   HbA1C: Recent Labs    07/12/18 0925  HGBA1C 5.5   Urine analysis:    Component Value Date/Time   COLORURINE YELLOW 07/12/2018 0913   APPEARANCEUR CLEAR 07/12/2018 0913   LABSPEC >1.046 (H) 07/12/2018 0913   PHURINE 5.0 07/12/2018 0913   GLUCOSEU NEGATIVE 07/12/2018 0913   HGBUR SMALL (A) 07/12/2018 0913   BILIRUBINUR NEGATIVE 07/12/2018 0913   KETONESUR NEGATIVE 07/12/2018 0913   PROTEINUR NEGATIVE 07/12/2018 0913   UROBILINOGEN 0.2 03/03/2012 0205   NITRITE NEGATIVE 07/12/2018 0913   LEUKOCYTESUR NEGATIVE 07/12/2018 0913    Recent Results (from the past 240 hour(s))  Culture, Urine     Status: None   Collection Time: 07/12/18  9:13 AM  Result Value Ref Range Status   Specimen Description   Final    URINE, CLEAN CATCH Performed at [facility], [address] [place], [place] [address]    Special Requests   Final    NONE Performed at [facility], [address] [place], [place] [address]    Culture   Final    NO GROWTH Performed at [facility], [place] [address] [place],  [address]    Report Status 07/14/2018 FINAL  Final     Scheduled Meds: . aspirin  325 mg Oral Daily  . atorvastatin  10 mg Oral q1800  . diltiazem  15 mg Oral Q6H  . enoxaparin (LOVENOX) injection  40 mg Subcutaneous Q24H   Continuous Infusions: . sodium chloride 10 mL/hr at 07/14/18 1000     Procedures/Studies: Ct Angio Head W Or Wo Contrast  Result Date: 07/12/2018 CLINICAL DATA:  Right arm weakness since 07/11/2018 EXAM: CT ANGIOGRAPHY HEAD AND NECK TECHNIQUE: Multidetector CT imaging of the head and neck was performed using the standard protocol during bolus administration of intravenous contrast. Multiplanar CT image reconstructions and MIPs were obtained to evaluate the vascular anatomy. Carotid stenosis measurements (when applicable) are obtained utilizing NASCET criteria, using the distal internal carotid diameter as the denominator. CONTRAST:  122mL ISOVUE-370 IOPAMIDOL (ISOVUE-370) INJECTION 76% COMPARISON:  Noncontrast head CT from earlier today FINDINGS: CTA NECK FINDINGS Aortic arch: Limited coverage is negative. Brachiocephalic origin is not seen. Right carotid system: Mild narrowing of the distal ICA due to tortuosity and kinking. Mild non ulcerated plaque about the bifurcation. Left carotid system: Mild calcified plaque at the ICA bulb. No stenosis or ulceration. Vertebral arteries: No proximal subclavian stenosis or ulceration. Skeleton: Left dominant vertebral artery. Mild calcified plaque at the right vertebral origin. No flow limiting stenosis in the vertebrals or subclavians. Other neck: 11 mm left thyroid nodule, partially cystic, below size threshold  for sonographic follow-up per consensus guidelines. Upper chest: Mild centrilobular emphysema. Review of the MIP images confirms the above findings CTA HEAD FINDINGS Anterior circulation: Calcified plaque on the carotid siphons. No branch occlusion or flow limiting stenosis. Negative for aneurysm. Posterior circulation: Vessels are smooth and widely patent. Fenestration of the proximal basilar. Venous sinuses: Patent Anatomic variants: As above Delayed phase: No abnormal intracranial enhancement. Multiple remote small vessel infarcts. Review of the MIP images confirms the above findings IMPRESSION: 1. No emergent finding. 2.  Mild atherosclerosis in the neck without stenosis or ulceration. 3. Multiple lacunar infarcts. 4.  Emphysema (ICD10-J43.9). Electronically Signed   By: Monte Fantasia M.D.   On: 07/12/2018 11:10   Dg Chest 2 View  Result Date: 07/12/2018 CLINICAL DATA:  Acute Stroke symptoms EXAM: CHEST - 2 VIEW COMPARISON:  09/17/2011 FINDINGS: Background COPD/emphysema noted as before. Normal heart size and vascularity. Slight right hilar prominence appears to be secondary to slight rotation to the right. No focal pneumonia, collapse or consolidation. Negative for edema, effusion or pneumothorax. No acute osseous finding. IMPRESSION: COPD/emphysema.  Stable exam.  No superimposed acute process Electronically Signed   By: Jerilynn Mages.  Shick M.D.   On: 07/12/2018 20:45   Ct Head Wo Contrast  Result Date: 07/12/2018 CLINICAL DATA:  Right-sided weakness and right facial droop EXAM: CT HEAD WITHOUT CONTRAST TECHNIQUE: Contiguous axial images were obtained from the base of the skull through the vertex without intravenous contrast. COMPARISON:  None. FINDINGS: Brain: There is mild diffuse atrophy for age. There is no intracranial mass, hemorrhage, extra-axial fluid collection, or midline shift. There is mild small vessel disease in the centra semiovale bilaterally. There is small vessel disease with clear infarcts in the anterior limbs of the left internal and external capsules. There is a lacunar infarct in the anterior superior left thalamus. There is a small lacunar infarct in the anterior most aspect of the right thalamus. There are age uncertain small infarcts in each lower midbrain-upper pons junction. There is a focus of decreased attenuation in the inferior left centrum semiovale at the level of the left frontal horn of the lateral ventricle which appears more prominent than other areas of small vessel disease and may represent a recent white matter infarct in this area. Vascular: No appreciable hyperdense vessel is demonstrable  on this study. There is calcification in each carotid siphon region. Skull: Bony calvarium appears intact. Sinuses/Orbits: There is mild mucosal thickening in several ethmoid air cells. Other visualized paranasal sinuses are clear. Visualized orbits appear symmetric bilaterally. Other: Mastoid air cells are clear. IMPRESSION: Atrophy with periventricular small vessel disease. Prior lacunar infarcts in the left basal ganglia and bilateral thalami. Age uncertain infarcts in the lower midbrain-upper pons junction, more notable on the left than on the right. Question recent small infarct in the white matter of the inferior left centrum semiovale at the frontal horn left lateral ventricle level. No mass or hemorrhage. Foci of arterial vascular calcification noted. Mucosal thickening noted in several ethmoid air cells. Electronically Signed   By: Lowella Grip III M.D.   On: 07/12/2018 09:39   Ct Angio Neck W And/or Wo Contrast  Result Date: 07/12/2018 CLINICAL DATA:  Right arm weakness since 07/11/2018 EXAM: CT ANGIOGRAPHY HEAD AND NECK TECHNIQUE: Multidetector CT imaging of the head and neck was performed using the standard protocol during bolus administration of intravenous contrast. Multiplanar CT image reconstructions and MIPs were obtained to evaluate the vascular anatomy. Carotid stenosis measurements (when applicable) are obtained  utilizing NASCET criteria, using the distal internal carotid diameter as the denominator. CONTRAST:  180mL ISOVUE-370 IOPAMIDOL (ISOVUE-370) INJECTION 76% COMPARISON:  Noncontrast head CT from earlier today FINDINGS: CTA NECK FINDINGS Aortic arch: Limited coverage is negative. Brachiocephalic origin is not seen. Right carotid system: Mild narrowing of the distal ICA due to tortuosity and kinking. Mild non ulcerated plaque about the bifurcation. Left carotid system: Mild calcified plaque at the ICA bulb. No stenosis or ulceration. Vertebral arteries: No proximal subclavian  stenosis or ulceration. Skeleton: Left dominant vertebral artery. Mild calcified plaque at the right vertebral origin. No flow limiting stenosis in the vertebrals or subclavians. Other neck: 11 mm left thyroid nodule, partially cystic, below size threshold for sonographic follow-up per consensus guidelines. Upper chest: Mild centrilobular emphysema. Review of the MIP images confirms the above findings CTA HEAD FINDINGS Anterior circulation: Calcified plaque on the carotid siphons. No branch occlusion or flow limiting stenosis. Negative for aneurysm. Posterior circulation: Vessels are smooth and widely patent. Fenestration of the proximal basilar. Venous sinuses: Patent Anatomic variants: As above Delayed phase: No abnormal intracranial enhancement. Multiple remote small vessel infarcts. Review of the MIP images confirms the above findings IMPRESSION: 1. No emergent finding. 2. Mild atherosclerosis in the neck without stenosis or ulceration. 3. Multiple lacunar infarcts. 4.  Emphysema (ICD10-J43.9). Electronically Signed   By: Monte Fantasia M.D.   On: 07/12/2018 11:10   Mr Brain Wo Contrast  Result Date: 07/13/2018 CLINICAL DATA:  Right-sided weakness.  Stroke EXAM: MRI HEAD WITHOUT CONTRAST TECHNIQUE: Multiplanar, multiecho pulse sequences of the brain and surrounding structures were obtained without intravenous contrast. COMPARISON:  CT head 07/12/2018 FINDINGS: Brain: Acute infarct in the left centrum semiovale measuring approximately 15 x 20 mm. 5 mm acute infarct in the genu internal capsule on the right. Extensive chronic microvascular ischemic changes throughout the white matter basal ganglia and pons. Negative for hemorrhage or mass. Mild atrophy without hydrocephalus. Vascular: Normal arterial flow voids. Skull and upper cervical spine: Negative Sinuses/Orbits: Mild mucosal edema paranasal sinuses.  Normal orbit Other: None IMPRESSION: Acute infarct left centrum semiovale. Small acute infarct genu  internal capsule on the right. Extensive chronic microvascular ischemia. Electronically Signed   By: Franchot Gallo M.D.   On: 07/13/2018 08:50   Mr Brain W Contrast  Result Date: 07/14/2018 CLINICAL DATA:  Multiple sclerosis with new neurologic event EXAM: MRI HEAD WITH CONTRAST TECHNIQUE: Multiplanar, multiecho pulse sequences of the brain and surrounding structures were obtained with intravenous contrast. CONTRAST:  7.5 cc Gadavist intravenous COMPARISON:  Brain MRI from yesterday FINDINGS: Brain: No abnormal intracranial enhancement. Multiple lacunes in the deep white matter tracts and deep gray nuclei and pons, more typical of chronic ischemic injury than demyelination. Vascular: Major vascular enhancements are preserved Skull and upper cervical spine: Negative Sinuses/Orbits: Negative IMPRESSION: 1. No abnormal intracranial enhancement. 2. Pattern of injury is more consistent with chronic small vessel ischemia than demyelination. Electronically Signed   By: Monte Fantasia M.D.   On: 07/14/2018 08:44    Barton Dubois, MD  [facility] Pager [phone]  If 7PM-7AM, please contact night-coverage [email or web address] Password [email or web address] 07/14/2018, 6:14 PM   LOS: 1 day

## 2018-07-14 NOTE — Clinical Social Work Note (Signed)
Clinical Social Work Assessment  Patient Details  Name: Evan Chavez MRN: [id number] Date of Birth: 02-09-1961  Date of referral:  07/14/18               Reason for consult:  Facility Placement                Permission sought to share information with:    Permission granted to share information::     Name::        Agency::     Relationship::     Contact Information:  Mrs. Ronnald Ramp, mother, was at bedside   Housing/Transportation Living arrangements for the past 2 months:  Single Family Home Source of Information:  Patient, Parent Patient Interpreter Needed:  None Criminal Activity/Legal Involvement Pertinent to Current Situation/Hospitalization:  No - Comment as needed Significant Relationships:  Adult Children, Parents Lives with:  Adult Children Do you feel safe going back to the place where you live?  Yes Need for family participation in patient care:  Yes (Comment)  Care giving concerns:  None identified.   Social Worker assessment / plan:  Patient is independent at Baseline. Patient is agreeable to staying 30 days in rehab facility.   Employment status:  Unemployed Forensic scientist:  Self Pay (Medicaid Pending) PT Recommendations:  [place] / Referral to community resources:  [place]  Patient/Family's Response to care:  Patient is agreeable to SNF for rehab.   Patient/Family's Understanding of and Emotional Response to Diagnosis, Current Treatment, and Prognosis:  Patient understands his diagnosis, treatment and prognosis.   Emotional Assessment Appearance:  Appears stated age Attitude/Demeanor/Rapport:    Affect (typically observed):  Calm, Accepting Orientation:  Oriented to Self, Oriented to Place, Oriented to  Time, Oriented to Situation Alcohol / Substance use:  Not Applicable Psych involvement (Current and /or in the community):     Discharge Needs  Concerns to be addressed:  Discharge Planning  Concerns Readmission within the last 30 days:  No Current discharge risk:  None Barriers to Discharge:  No Barriers Identified   Ihor Gully, LCSW 07/14/2018, 2:31 PM

## 2018-07-15 LAB — HOMOCYSTEINE: Homocysteine: 17.4 umol/L — ABNORMAL HIGH (ref 0.0–15.0)

## 2018-07-15 LAB — RPR: RPR Ser Ql: NONREACTIVE

## 2018-07-15 MED ORDER — ATORVASTATIN CALCIUM 10 MG PO TABS: 10.0000 mg | ORAL_TABLET | Freq: Every day | ORAL | Status: DC

## 2018-07-15 MED ORDER — DILTIAZEM HCL ER COATED BEADS 120 MG PO CP24: 120.0000 mg | ORAL_CAPSULE | Freq: Every day | ORAL | Status: DC

## 2018-07-15 MED ORDER — ASPIRIN 325 MG PO TABS: 325.0000 mg | ORAL_TABLET | Freq: Every day | ORAL | Status: DC

## 2018-07-15 NOTE — Progress Notes (Signed)
Inpatient Rehabilitation-Admissions Coordinator   Noted plan to DC to [facility] today. AC will sign off.   Please call if questions.   Jhonnie Garner, OTR/L  Rehab Admissions Coordinator  [phone] 07/15/2018 1:18 PM

## 2018-07-15 NOTE — Care Management (Signed)
CM provided pt and mother with resources for MD follow up and medications. They plan to contact [facility] to establish care with PCP. DC to [facility] today.

## 2018-07-15 NOTE — Discharge Summary (Signed)
Physician Discharge Summary  Evan Chavez LZJ:[id number] DOB: June 24, 1961 DOA: 07/12/2018  PCP: Patient, No Pcp Per  Admit date: 07/12/2018 Discharge date: 07/15/2018  Time spent: 35 minutes  Recommendations for Outpatient Follow-up:  1. Repeat basic metabolic panel to follow electrolytes and renal function in 5 days 2. Repeat LFTs and lipid panel in 6 weeks. 3. Outpatient follow-up with Dr. Merlene Laughter (neurology service) in 6 weeks. 4. Reassess blood pressure and further adjust antihypertensive regimen as needed.   Discharge Diagnoses:  Active Problems:   Acute ischemic stroke [facility])   Essential hypertension   Tobacco abuse   Cocaine abuse ([place])   Atrial tachycardia ([place])   Dyslipidemia   Discharge Condition: Stable and overall improved.  Patient to be discharged to skilled nursing facility for rehabilitation and further care.  Diet recommendation: Heart healthy diet  Filed Weights   07/12/18 0906 07/12/18 1946  Weight: 65.8 kg 59.8 kg    History of present illness:  57 y.o.malewith medical history ofhypertension and polysubstance abuse presenting with right hemiparesis and dysarthria that began around 11:30 PM on 07/11/2018 while the patient was at work. The patient works as a Curator, and he noticed some weakness in his right upper extremity and subsequently right lower extremity while he was at work. Later, the patient developed some dysarthria. He did not think anything of it and subsequently finished his shift and went home. He woke up in the morning of 07/12/2018 with persistent symptoms and right facial droop. As result, the patient presented to emergency department for further evaluation.  Hospital Course:  Acute ischemic stroke -Neurology Consult -PT/OT evaluation--->CIR recommended; patient has been declined and plan is to pursuit SNF for rehabilitation and conditioning. -Speech therapy eval-->continue SPL for language rehab and continue dysphagia 3 with thin  liquids. -CT brain--CT of the brain showed prior lacunar strokes in the basal ganglia and bilateral thalami with age uncertain infarcts of the left pons/midbrain junction. -MRI brain--acute infarct L-centrum ovale and right internal capsule -CTA H&N--no hemodynamically significant stenosis -Echo--no wall motion abnormalities, mild LVH, grade 1 diastolic dysfunction, ejection fraction 60 to 65%.  No signs of thrombi. -LDL--77 -HbA1C--5.5 -Antiplatelet--continue ASA 325 mg for secondary prevention.  SVT/Atrial Tachycardia -HR stable and well-controlled currently -Continue Cardizem.  Essential Hypertension -allowed for permissive HTN initially -Patient will be discharged on diltiazem 120 mg on daily basis -Encouraged to follow low-sodium diet.  Dysplidemia -continue atorvastatin -Repeat LFTs and lipid panel in 6 weeks.  Polysubstance abuse -including tobacco and cocaine -cessation counseling provided.  -Patient was receptive and motivated starting counseling; looking to keep himself away from recreational drugs and tobacco.  Chronic diastolic heart failure -Mild diastolic dysfunction appreciated on 2D echo and most likely associated with chronic hypertension. -Will focus on controlling blood pressure and follow low sodium diet. -Currently euvolemic.  Procedures:  See below for x-ray reports  2D echo: No wall motion abnormalities, no source of thrombi appreciated; preserved ejection fraction, grade 1 diastolic heart failure.    CT angio head and neck: No hemodynamically significant stenosis appreciated.  Consultations:  Neurology  Discharge Exam: Vitals:   07/15/18 0745 07/15/18 1150  BP: (!) 144/96 (!) 140/96  Pulse: 86 78  Resp: 20 18  Temp:  98.1 F (36.7 C)  SpO2: 97% 98%   General exam: Alert, awake, oriented x 3, afebrile, in no acute distress, denies chest pain, no shortness of breath.  Patient is following commands appropriately and denies nausea or  vomiting. Respiratory system: Clear to auscultation. Respiratory  effort normal. Cardiovascular system:RRR. No murmurs, rubs, gallops. Gastrointestinal system: Abdomen is nondistended, soft and nontender. No organomegaly or masses felt. Normal bowel sounds heard. Central nervous system: Alert and oriented.  Patient with right hemiparesis (affecting more upper extremity than lower extremity).  Mild dysarthria also appreciated. Extremities: No C/C/E, +pedal pulses Skin: No rashes, lesions or ulcers Psychiatry: Judgement and insight appear normal.  Slightly flat affect appreciated on exam.   Discharge Instructions   Discharge Instructions    Diet - low sodium heart healthy   Complete by:  As directed    Discharge instructions   Complete by:  As directed    Follow heart healthy diet Stop smoking Stop the use of recreational drugs Keep yourself well-hydrated Follow-up with neurology service (Dr. Merlene Laughter) in 4-6 weeks Physical rehabilitation as per skilled nursing facility protocol.     Allergies as of 07/15/2018   No Known Allergies     Medication List    TAKE these medications   aspirin 325 MG tablet Take 1 tablet (325 mg total) by mouth daily. Start taking on:  07/16/2018   atorvastatin 10 MG tablet Commonly known as:  LIPITOR Take 1 tablet (10 mg total) by mouth daily at 6 PM.   diltiazem 120 MG 24 hr capsule Commonly known as:  CARDIZEM CD Take 1 capsule (120 mg total) by mouth daily.      No Known Allergies  Contact information for follow-up providers    Phillips Odor, MD. Schedule an appointment as soon as possible for a visit in 6 week(s).   Specialty:  Neurology Contact information: [address] [place] [address] [phone]            Contact information for after-discharge care    Destination    [facility] .   Service:  Skilled Nursing Contact information: [place] [phone]                   The results of significant diagnostics from this hospitalization (including imaging, microbiology, ancillary and laboratory) are listed below for reference.    Significant Diagnostic Studies: Ct Angio Head W Or Wo Contrast  Result Date: 07/12/2018 CLINICAL DATA:  Right arm weakness since 07/11/2018 EXAM: CT ANGIOGRAPHY HEAD AND NECK TECHNIQUE: Multidetector CT imaging of the head and neck was performed using the standard protocol during bolus administration of intravenous contrast. Multiplanar CT image reconstructions and MIPs were obtained to evaluate the vascular anatomy. Carotid stenosis measurements (when applicable) are obtained utilizing NASCET criteria, using the distal internal carotid diameter as the denominator. CONTRAST:  131mL ISOVUE-370 IOPAMIDOL (ISOVUE-370) INJECTION 76% COMPARISON:  Noncontrast head CT from earlier today FINDINGS: CTA NECK FINDINGS Aortic arch: Limited coverage is negative. Brachiocephalic origin is not seen. Right carotid system: Mild narrowing of the distal ICA due to tortuosity and kinking. Mild non ulcerated plaque about the bifurcation. Left carotid system: Mild calcified plaque at the ICA bulb. No stenosis or ulceration. Vertebral arteries: No proximal subclavian stenosis or ulceration. Skeleton: Left dominant vertebral artery. Mild calcified plaque at the right vertebral origin. No flow limiting stenosis in the vertebrals or subclavians. Other neck: 11 mm left thyroid nodule, partially cystic, below size threshold for sonographic follow-up per consensus guidelines. Upper chest: Mild centrilobular emphysema. Review of the MIP images confirms the above findings CTA HEAD FINDINGS Anterior circulation: Calcified plaque on the carotid siphons. No branch occlusion or flow limiting stenosis. Negative for aneurysm. Posterior circulation: Vessels are  smooth and widely patent. Fenestration of the proximal basilar. Venous sinuses: Patent Anatomic variants: As  above Delayed phase: No abnormal intracranial enhancement. Multiple remote small vessel infarcts. Review of the MIP images confirms the above findings IMPRESSION: 1. No emergent finding. 2. Mild atherosclerosis in the neck without stenosis or ulceration. 3. Multiple lacunar infarcts. 4.  Emphysema (ICD10-J43.9). Electronically Signed   By: Monte Fantasia M.D.   On: 07/12/2018 11:10   Dg Chest 2 View  Result Date: 07/12/2018 CLINICAL DATA:  Acute Stroke symptoms EXAM: CHEST - 2 VIEW COMPARISON:  09/17/2011 FINDINGS: Background COPD/emphysema noted as before. Normal heart size and vascularity. Slight right hilar prominence appears to be secondary to slight rotation to the right. No focal pneumonia, collapse or consolidation. Negative for edema, effusion or pneumothorax. No acute osseous finding. IMPRESSION: COPD/emphysema.  Stable exam.  No superimposed acute process Electronically Signed   By: Jerilynn Mages.  Shick M.D.   On: 07/12/2018 20:45   Ct Head Wo Contrast  Result Date: 07/12/2018 CLINICAL DATA:  Right-sided weakness and right facial droop EXAM: CT HEAD WITHOUT CONTRAST TECHNIQUE: Contiguous axial images were obtained from the base of the skull through the vertex without intravenous contrast. COMPARISON:  None. FINDINGS: Brain: There is mild diffuse atrophy for age. There is no intracranial mass, hemorrhage, extra-axial fluid collection, or midline shift. There is mild small vessel disease in the centra semiovale bilaterally. There is small vessel disease with clear infarcts in the anterior limbs of the left internal and external capsules. There is a lacunar infarct in the anterior superior left thalamus. There is a small lacunar infarct in the anterior most aspect of the right thalamus. There are age uncertain small infarcts in each lower midbrain-upper pons junction. There is a focus of decreased attenuation in the inferior left centrum semiovale at the level of the left frontal horn of the lateral ventricle  which appears more prominent than other areas of small vessel disease and may represent a recent white matter infarct in this area. Vascular: No appreciable hyperdense vessel is demonstrable on this study. There is calcification in each carotid siphon region. Skull: Bony calvarium appears intact. Sinuses/Orbits: There is mild mucosal thickening in several ethmoid air cells. Other visualized paranasal sinuses are clear. Visualized orbits appear symmetric bilaterally. Other: Mastoid air cells are clear. IMPRESSION: Atrophy with periventricular small vessel disease. Prior lacunar infarcts in the left basal ganglia and bilateral thalami. Age uncertain infarcts in the lower midbrain-upper pons junction, more notable on the left than on the right. Question recent small infarct in the white matter of the inferior left centrum semiovale at the frontal horn left lateral ventricle level. No mass or hemorrhage. Foci of arterial vascular calcification noted. Mucosal thickening noted in several ethmoid air cells. Electronically Signed   By: Lowella Grip III M.D.   On: 07/12/2018 09:39   Ct Angio Neck W And/or Wo Contrast  Result Date: 07/12/2018 CLINICAL DATA:  Right arm weakness since 07/11/2018 EXAM: CT ANGIOGRAPHY HEAD AND NECK TECHNIQUE: Multidetector CT imaging of the head and neck was performed using the standard protocol during bolus administration of intravenous contrast. Multiplanar CT image reconstructions and MIPs were obtained to evaluate the vascular anatomy. Carotid stenosis measurements (when applicable) are obtained utilizing NASCET criteria, using the distal internal carotid diameter as the denominator. CONTRAST:  163mL ISOVUE-370 IOPAMIDOL (ISOVUE-370) INJECTION 76% COMPARISON:  Noncontrast head CT from earlier today FINDINGS: CTA NECK FINDINGS Aortic arch: Limited coverage is negative. Brachiocephalic origin is not seen. Right carotid  system: Mild narrowing of the distal ICA due to tortuosity and  kinking. Mild non ulcerated plaque about the bifurcation. Left carotid system: Mild calcified plaque at the ICA bulb. No stenosis or ulceration. Vertebral arteries: No proximal subclavian stenosis or ulceration. Skeleton: Left dominant vertebral artery. Mild calcified plaque at the right vertebral origin. No flow limiting stenosis in the vertebrals or subclavians. Other neck: 11 mm left thyroid nodule, partially cystic, below size threshold for sonographic follow-up per consensus guidelines. Upper chest: Mild centrilobular emphysema. Review of the MIP images confirms the above findings CTA HEAD FINDINGS Anterior circulation: Calcified plaque on the carotid siphons. No branch occlusion or flow limiting stenosis. Negative for aneurysm. Posterior circulation: Vessels are smooth and widely patent. Fenestration of the proximal basilar. Venous sinuses: Patent Anatomic variants: As above Delayed phase: No abnormal intracranial enhancement. Multiple remote small vessel infarcts. Review of the MIP images confirms the above findings IMPRESSION: 1. No emergent finding. 2. Mild atherosclerosis in the neck without stenosis or ulceration. 3. Multiple lacunar infarcts. 4.  Emphysema (ICD10-J43.9). Electronically Signed   By: Monte Fantasia M.D.   On: 07/12/2018 11:10   Mr Brain Wo Contrast  Result Date: 07/13/2018 CLINICAL DATA:  Right-sided weakness.  Stroke EXAM: MRI HEAD WITHOUT CONTRAST TECHNIQUE: Multiplanar, multiecho pulse sequences of the brain and surrounding structures were obtained without intravenous contrast. COMPARISON:  CT head 07/12/2018 FINDINGS: Brain: Acute infarct in the left centrum semiovale measuring approximately 15 x 20 mm. 5 mm acute infarct in the genu internal capsule on the right. Extensive chronic microvascular ischemic changes throughout the white matter basal ganglia and pons. Negative for hemorrhage or mass. Mild atrophy without hydrocephalus. Vascular: Normal arterial flow voids. Skull and  upper cervical spine: Negative Sinuses/Orbits: Mild mucosal edema paranasal sinuses.  Normal orbit Other: None IMPRESSION: Acute infarct left centrum semiovale. Small acute infarct genu internal capsule on the right. Extensive chronic microvascular ischemia. Electronically Signed   By: Franchot Gallo M.D.   On: 07/13/2018 08:50   Mr Brain W Contrast  Result Date: 07/14/2018 CLINICAL DATA:  Multiple sclerosis with new neurologic event EXAM: MRI HEAD WITH CONTRAST TECHNIQUE: Multiplanar, multiecho pulse sequences of the brain and surrounding structures were obtained with intravenous contrast. CONTRAST:  7.5 cc Gadavist intravenous COMPARISON:  Brain MRI from yesterday FINDINGS: Brain: No abnormal intracranial enhancement. Multiple lacunes in the deep white matter tracts and deep gray nuclei and pons, more typical of chronic ischemic injury than demyelination. Vascular: Major vascular enhancements are preserved Skull and upper cervical spine: Negative Sinuses/Orbits: Negative IMPRESSION: 1. No abnormal intracranial enhancement. 2. Pattern of injury is more consistent with chronic small vessel ischemia than demyelination. Electronically Signed   By: Monte Fantasia M.D.   On: 07/14/2018 08:44    Microbiology: Recent Results (from the past 240 hour(s))  Culture, Urine     Status: None   Collection Time: 07/12/18  9:13 AM  Result Value Ref Range Status   Specimen Description   Final    URINE, CLEAN CATCH Performed at [facility], [address], [place], [place] [address]    Special Requests   Final    NONE Performed at [facility], [address] [place], [place] [address]    Culture   Final    NO GROWTH Performed at [facility], [place] [address], [place], [place] [address]    Report Status 07/14/2018 FINAL  Final     Labs: Basic Metabolic Panel: Recent Labs  Lab 07/12/18 [phone]  NA 137  K 4.1  CL 107  CO2 20*  GLUCOSE 100*  BUN 12  CREATININE 0.91  CALCIUM 8.8*   Liver  Function Tests: Recent Labs  Lab 07/12/18 0925  AST 18  ALT 30  ALKPHOS 88  BILITOT 0.5  PROT 8.0  ALBUMIN 4.0   CBC: Recent Labs  Lab 07/12/18 0925  WBC 5.4  NEUTROABS 3.6  HGB 14.0  HCT 44.1  MCV 87.0  PLT 269    CBG: Recent Labs  Lab 07/12/18 1005  GLUCAP 86    Signed:  Barton Dubois MD.  [facility] 07/15/2018, 2:20 PM

## 2018-07-15 NOTE — Clinical Social Work Placement (Signed)
   CLINICAL SOCIAL WORK PLACEMENT  NOTE  Date:  07/15/2018  Patient Details  Name: Evan Chavez MRN: [id number] Date of Birth: 1961/08/27  Clinical Social Work is seeking post-discharge placement for this patient at the [place] level of care (*CSW will initial, date and re-position this form in  chart as items are completed):  Yes   Patient/family provided with [facility] Work Department's list of facilities offering this level of care within the geographic area requested by the patient (or if unable, by the patient's family).  Yes   Patient/family informed of their freedom to choose among providers that offer the needed level of care, that participate in Medicare, Medicaid or managed care program needed by the patient, have an available bed and are willing to accept the patient.  Yes   Patient/family informed of [facility]'s ownership interest in [facility] and [facility], as well as of the fact that they are under no obligation to receive care at these facilities.  PASRR submitted to EDS on 07/14/18     PASRR number received on 07/14/18     Existing PASRR number confirmed on       FL2 transmitted to all facilities in geographic area requested by pt/family on 07/14/18     FL2 transmitted to all facilities within larger geographic area on       Patient informed that his/her managed care company has contracts with or will negotiate with certain facilities, including the following:        Yes   Patient/family informed of bed offers received.  Patient chooses bed at [facility]     Physician recommends and patient chooses bed at      Patient to be transferred to [facility] on 07/15/18.  Patient to be transferred to facility by Mother, Mrs. Ronnald Ramp     Patient family notified on 07/15/18 of transfer.  Name of family member notified:  Mother, Mrs. Ronnald Ramp and Son, [place]     PHYSICIAN       Additional Comment:  Facility aware  of discharge. Discharge clinicals sent. LCSW signing off.   _______________________________________________ Ihor Gully, LCSW 07/15/2018, 3:10 PM

## 2018-07-15 NOTE — Progress Notes (Signed)
IVs removed, patient tolerated well.  Report called to Arbie Cookey, RN at [facility] and all questions answered.  Patient's mother to transport patient to [facility].

## 2018-07-15 NOTE — Progress Notes (Signed)
Physical Therapy Treatment Patient Details Name: Evan Chavez MRN: [id number] DOB: 11-23-1960 Today's Date: 07/15/2018    History of Present Illness 57 y/o male presents with acute right-sided weakness. 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 He did not drop his equipment but has been unable to use his arm significantly since.  Has right arm and leg weakness as well as a right-sided facial droop and slurred speech.  Denies headache or blurred vision.  He smokes but denies any significant past medical history.  No chest pain.    PT Comments    Pt was seen for gait on RW with min to min guard assist then to progress with strengthening and balance skills.  He is comfortable with all activity and is expecting to go to a rehab setting once dc from hospital.  He is progressing with all mobility and increasing control of R side, balance and endurance.  Follow Up Recommendations  CIR;Supervision/Assistance - 24 hour;Supervision for mobility/OOB     Equipment Recommendations  Rolling walker with 5" wheels    Recommendations for Other Services       Precautions / Restrictions Precautions Precautions: Fall Restrictions Weight Bearing Restrictions: No    Mobility  Bed Mobility               General bed mobility comments: up in chair when PT arrived  Transfers Overall transfer level: Needs assistance Equipment used: 1 person hand held assist Transfers: Sit to/from Stand Sit to Stand: Min guard;Min assist         General transfer comment: prompts to use LUE on chair arm  Ambulation/Gait Ambulation/Gait assistance: Min guard;Min assist Gait Distance (Feet): 60 Feet Assistive device: Rolling walker (2 wheeled);1 person hand held assist Gait Pattern/deviations: Step-through pattern;Decreased stride length;Wide base of support Gait velocity: reduced   General Gait Details: more stable with min assist to control grip on R hand, slow careful turns wtih RW,  controlling advancement of LLE by locking R knee   Stairs             Wheelchair Mobility    Modified Rankin (Stroke Patients Only)       Balance Overall balance assessment: Needs assistance Sitting-balance support: Feet supported Sitting balance-Leahy Scale: Good     Standing balance support: Bilateral upper extremity supported Standing balance-Leahy Scale: Fair Standing balance comment: fair with RW initially and more care needed with dynamic standing tasks                            Cognition Arousal/Alertness: Awake/alert Behavior During Therapy: WFL for tasks assessed/performed Overall Cognitive Status: Within Functional Limits for tasks assessed                                 General Comments: Good follow through with walker      Exercises General Exercises - Lower Extremity Ankle Circles/Pumps: AROM;Both;5 reps Quad Sets: AROM;Both;10 reps Gluteal Sets: AROM;10 reps Hip ABduction/ADduction: Strengthening;Both;10 reps    General Comments        Pertinent Vitals/Pain Pain Assessment: No/denies pain    Home Living                      Prior Function            PT Goals (current goals can now be found in the care plan section)  Acute Rehab PT Goals Patient Stated Goal: to walk Progress towards PT goals: Progressing toward goals    Frequency    7X/week      PT Plan Current plan remains appropriate    Co-evaluation              AM-PAC PT "6 Clicks" Daily Activity  Outcome Measure  Difficulty turning over in bed (including adjusting bedclothes, sheets and blankets)?: None Difficulty moving from lying on back to sitting on the side of the bed? : None Difficulty sitting down on and standing up from a chair with arms (e.g., wheelchair, bedside commode, etc,.)?: Unable Help needed moving to and from a bed to chair (including a wheelchair)?: A Little Help needed walking in hospital room?: A  Little Help needed climbing 3-5 steps with a railing? : A Little 6 Click Score: 18    End of Session Equipment Utilized During Treatment: Gait belt Activity Tolerance: Patient tolerated treatment well Patient left: in chair;with call bell/phone within reach;with chair alarm set;with family/visitor present Nurse Communication: Mobility status PT Visit Diagnosis: Unsteadiness on feet (R26.81);Other abnormalities of gait and mobility (R26.89);Muscle weakness (generalized) (M62.81)     Time: 2725-3664 PT Time Calculation (min) (ACUTE ONLY): 29 min  Charges:  $Gait Training: 8-22 mins $Therapeutic Exercise: 8-22 mins                   Ramond Dial 07/15/2018, 8:43 PM   8:45 PM, 07/15/18 Mee Hives, PT, MS Physical Therapist - [place] [phone] (Office)

## 2018-08-21 ENCOUNTER — Other Ambulatory Visit: Payer: Self-pay

## 2018-08-21 ENCOUNTER — Emergency Department (HOSPITAL_COMMUNITY): Payer: Medicaid Other

## 2018-08-21 ENCOUNTER — Inpatient Hospital Stay (HOSPITAL_COMMUNITY)
Admission: EM | Admit: 2018-08-21 | Discharge: 2018-08-26 | DRG: 871 | Disposition: A | Discharge status: Home / Self Care | Payer: Medicaid Other | Attending: Family Medicine | Admitting: Family Medicine

## 2018-08-21 ENCOUNTER — Encounter (HOSPITAL_COMMUNITY): Payer: Self-pay | Admitting: Emergency Medicine

## 2018-08-21 DIAGNOSIS — A419 Sepsis, unspecified organism: Principal | ICD-10-CM | POA: Diagnosis present

## 2018-08-21 DIAGNOSIS — J181 Lobar pneumonia, unspecified organism: Secondary | ICD-10-CM

## 2018-08-21 DIAGNOSIS — K2270 Barrett's esophagus without dysplasia: Secondary | ICD-10-CM | POA: Diagnosis present

## 2018-08-21 DIAGNOSIS — Z7982 Long term (current) use of aspirin: Secondary | ICD-10-CM | POA: Diagnosis not present

## 2018-08-21 DIAGNOSIS — K2100 Gastro-esophageal reflux disease with esophagitis, without bleeding: Secondary | ICD-10-CM

## 2018-08-21 DIAGNOSIS — K229 Disease of esophagus, unspecified: Secondary | ICD-10-CM

## 2018-08-21 DIAGNOSIS — I10 Essential (primary) hypertension: Secondary | ICD-10-CM | POA: Diagnosis present

## 2018-08-21 DIAGNOSIS — Z87891 Personal history of nicotine dependence: Secondary | ICD-10-CM

## 2018-08-21 DIAGNOSIS — Y95 Nosocomial condition: Secondary | ICD-10-CM | POA: Diagnosis present

## 2018-08-21 DIAGNOSIS — I2510 Atherosclerotic heart disease of native coronary artery without angina pectoris: Secondary | ICD-10-CM | POA: Diagnosis present

## 2018-08-21 DIAGNOSIS — I471 Supraventricular tachycardia: Secondary | ICD-10-CM | POA: Diagnosis present

## 2018-08-21 DIAGNOSIS — R740 Nonspecific elevation of levels of transaminase and lactic acid dehydrogenase [LDH]: Secondary | ICD-10-CM | POA: Diagnosis present

## 2018-08-21 DIAGNOSIS — K2990 Gastroduodenitis, unspecified, without bleeding: Secondary | ICD-10-CM | POA: Diagnosis present

## 2018-08-21 DIAGNOSIS — Z8249 Family history of ischemic heart disease and other diseases of the circulatory system: Secondary | ICD-10-CM | POA: Diagnosis not present

## 2018-08-21 DIAGNOSIS — K2970 Gastritis, unspecified, without bleeding: Secondary | ICD-10-CM

## 2018-08-21 DIAGNOSIS — K269 Duodenal ulcer, unspecified as acute or chronic, without hemorrhage or perforation: Secondary | ICD-10-CM | POA: Diagnosis present

## 2018-08-21 DIAGNOSIS — E876 Hypokalemia: Secondary | ICD-10-CM | POA: Diagnosis present

## 2018-08-21 DIAGNOSIS — E785 Hyperlipidemia, unspecified: Secondary | ICD-10-CM | POA: Diagnosis present

## 2018-08-21 DIAGNOSIS — R918 Other nonspecific abnormal finding of lung field: Secondary | ICD-10-CM

## 2018-08-21 DIAGNOSIS — I69351 Hemiplegia and hemiparesis following cerebral infarction affecting right dominant side: Secondary | ICD-10-CM | POA: Diagnosis not present

## 2018-08-21 DIAGNOSIS — R6520 Severe sepsis without septic shock: Secondary | ICD-10-CM

## 2018-08-21 DIAGNOSIS — R7401 Elevation of levels of liver transaminase levels: Secondary | ICD-10-CM

## 2018-08-21 DIAGNOSIS — R072 Precordial pain: Secondary | ICD-10-CM

## 2018-08-21 DIAGNOSIS — R059 Cough, unspecified: Secondary | ICD-10-CM

## 2018-08-21 DIAGNOSIS — R509 Fever, unspecified: Secondary | ICD-10-CM

## 2018-08-21 DIAGNOSIS — J189 Pneumonia, unspecified organism: Secondary | ICD-10-CM

## 2018-08-21 DIAGNOSIS — R05 Cough: Secondary | ICD-10-CM

## 2018-08-21 HISTORY — DX: Cerebral infarction, unspecified: I63.9

## 2018-08-21 LAB — COMPREHENSIVE METABOLIC PANEL
ALBUMIN: 3.7 g/dL (ref 3.5–5.0)
ALK PHOS: 95 U/L (ref 38–126)
ALT: 666 U/L — AB (ref 0–44)
ANION GAP: 10 (ref 5–15)
AST: 207 U/L — ABNORMAL HIGH (ref 15–41)
BUN: 12 mg/dL (ref 6–20)
CALCIUM: 8.7 mg/dL — AB (ref 8.9–10.3)
CHLORIDE: 101 mmol/L (ref 98–111)
CO2: 25 mmol/L (ref 22–32)
Creatinine, Ser: 0.78 mg/dL (ref 0.61–1.24)
GFR calc Af Amer: >60 mL/min (ref >60)
GFR calc non Af Amer: >60 mL/min (ref >60)
GLUCOSE: 125 mg/dL — AB (ref 70–99)
Potassium: 3.7 mmol/L (ref 3.5–5.1)
Sodium: 136 mmol/L (ref 135–145)
Total Bilirubin: 0.6 mg/dL (ref 0.3–1.2)
Total Protein: 7.3 g/dL (ref 6.5–8.1)

## 2018-08-21 LAB — CBC WITH DIFFERENTIAL/PLATELET
Abs Immature Granulocytes: 0.1 10*3/uL — ABNORMAL HIGH (ref 0.00–0.07)
BASOS PCT: 0 %
Basophils Absolute: 0 10*3/uL (ref 0.0–0.1)
EOS ABS: 0.1 10*3/uL (ref 0.0–0.5)
EOS PCT: 1 %
HCT: 43.1 % (ref 39.0–52.0)
Hemoglobin: 13.3 g/dL (ref 13.0–17.0)
Immature Granulocytes: 1 %
Lymphocytes Relative: 4 %
Lymphs Abs: 0.5 10*3/uL — ABNORMAL LOW (ref 0.7–4.0)
MCH: 26.1 pg (ref 26.0–34.0)
MCHC: 30.9 g/dL (ref 30.0–36.0)
MCV: 84.7 fL (ref 80.0–100.0)
Monocytes Absolute: 0.7 10*3/uL (ref 0.1–1.0)
Monocytes Relative: 5 %
NRBC: 0 % (ref 0.0–0.2)
Neutro Abs: 12.9 10*3/uL — ABNORMAL HIGH (ref 1.7–7.7)
Neutrophils Relative %: 89 %
PLATELETS: 317 10*3/uL (ref 150–400)
RBC: 5.09 MIL/uL (ref 4.22–5.81)
RDW: 13.3 % (ref 11.5–15.5)
WBC: 14.4 10*3/uL — ABNORMAL HIGH (ref 4.0–10.5)

## 2018-08-21 LAB — I-STAT TROPONIN, ED: TROPONIN I, POC: 0 ng/mL (ref 0.00–0.08)

## 2018-08-21 LAB — INFLUENZA PANEL BY PCR (TYPE A & B)
Influenza A By PCR: NEGATIVE
Influenza B By PCR: NEGATIVE

## 2018-08-21 LAB — I-STAT CG4 LACTIC ACID, ED: LACTIC ACID, VENOUS: 1.42 mmol/L (ref 0.5–1.9)

## 2018-08-21 MED ORDER — SODIUM CHLORIDE 0.9 % IV SOLN: 1.0000 g | Freq: Three times a day (TID) | INTRAVENOUS | Status: DC

## 2018-08-21 MED ORDER — SODIUM CHLORIDE 0.9 % IV SOLN
2.0000 g | Freq: Once | INTRAVENOUS | Status: AC
  Administered 2018-08-21: 2 g via INTRAVENOUS
  Filled 2018-08-21: qty 2

## 2018-08-21 MED ORDER — SODIUM CHLORIDE 0.9 % IV SOLN
1000.0000 mL | INTRAVENOUS | Status: DC
  Administered 2018-08-21 – 2018-08-23 (×4): 1000 mL via INTRAVENOUS

## 2018-08-21 MED ORDER — VANCOMYCIN HCL IN DEXTROSE 1-5 GM/200ML-% IV SOLN
1000.0000 mg | Freq: Once | INTRAVENOUS | Status: AC
  Administered 2018-08-21: 1000 mg via INTRAVENOUS
  Filled 2018-08-21: qty 200

## 2018-08-21 MED ORDER — METRONIDAZOLE IN NACL 5-0.79 MG/ML-% IV SOLN
500.0000 mg | Freq: Three times a day (TID) | INTRAVENOUS | Status: DC
  Administered 2018-08-21 – 2018-08-23 (×5): 500 mg via INTRAVENOUS
  Filled 2018-08-21 (×5): qty 100

## 2018-08-21 MED ORDER — HEPARIN SODIUM (PORCINE) 5000 UNIT/ML IJ SOLN
5000.0000 [IU] | Freq: Three times a day (TID) | INTRAMUSCULAR | Status: AC
  Administered 2018-08-22 – 2018-08-24 (×9): 5000 [IU] via SUBCUTANEOUS
  Filled 2018-08-21 (×9): qty 1

## 2018-08-21 NOTE — ED Triage Notes (Signed)
Pt c/o chest pain x less than 1 hour that feels like chest congestion, cough and fever as well, family states pt had a stroke in Sept and was in a rehab facility until 2 weeks ago

## 2018-08-21 NOTE — Progress Notes (Signed)
Pharmacy Note:  Initial antibiotic(s) regimen of Vancomycin and Cefepime ordered by EDP to treat infection of unknown source.  CrCl cannot be calculated (Patient's most recent lab result is older than the maximum 21 days allowed.).   No Known Allergies  Vitals:   08/21/18 2059  BP: (!) 126/92  Pulse: (!) 122  Resp: 18  Temp: 100.2 F (37.9 C)  SpO2: 98%    Anti-infectives (From admission, onward)   Start     Dose/Rate Route Frequency Ordered Stop   08/21/18 2130  ceFEPIme (MAXIPIME) 2 g in sodium chloride 0.9 % 100 mL IVPB     2 g 200 mL/hr over 30 Minutes Intravenous  Once 08/21/18 2128     08/21/18 2130  metroNIDAZOLE (FLAGYL) IVPB 500 mg     500 mg 100 mL/hr over 60 Minutes Intravenous Every 8 hours 08/21/18 2128     08/21/18 2130  vancomycin (VANCOCIN) IVPB 1000 mg/200 mL premix     1,000 mg 200 mL/hr over 60 Minutes Intravenous  Once 08/21/18 2128        Plan: Initial dose(s) of Vancomycin 1gm IV and Cefepime 2gm IV X 1 ordered. F/U admission orders for further dosing if therapy continued.  Isac Sarna, BS [place], [place] Clinical Pharmacist Pager [phone] 08/21/2018 10:06 PM

## 2018-08-21 NOTE — H&P (Signed)
History and Physical    YIFAN AUKER QAS:[id number] DOB: 1961/01/22 DOA: 08/21/2018  PCP: Patient, No Pcp Per   Patient coming from: Home  Chief Complaint: Chest pain  HPI: BENN TARVER is a 57 y.o. male with medical history significant of cocaine abuse, ETOH abuse, stroke, supraventricular tachycardia who was recently discharged 2 weeks ago from skilled nursing facility after undergoing physical therapy post CVA who comes emergency department with complaints of pleuritic chest pain since earlier in the evening associated with cough and yellowish sputum production, fatigue, malaise and fever.  He denies headache, sore throat, hemoptysis, palpitations, dizziness, diaphoresis, PND, orthopnea or pitting edema of the lower extremities.  He denies abdominal pain, nausea, emesis, diarrhea, constipation, melena or hematochezia.  No dysuria, frequency or hematuria.  Denies heat or cold intolerance.  No polyuria, polydipsia, polyphagia or blurred vision.  He denies skin rashes or pruritus.  ED Course: BP (!) 129/91   Pulse 100   Temp 100.2 F (37.9 C) (Oral)   Resp (!) 23   Ht 1.753 m (5\' 9" )   Wt 59.4 kg   SpO2 95%   BMI 19.35 kg/m.  The patient was given supplemental oxygen, cefepime and vancomycin in the emergency department.   White count was 14.4 with 89% neutrophils, hemoglobin 13.3 g/dL and platelets 317.  CMP shows a glucose of 125 and a calcium of 8.7 mg/dL.  Calcium normalizes when corrected to an albumin of 3.7 g/dL.  AST was 207 and ALT 666 U/L.  Lactic acid was normal.  Influenza A and B by PCR was negative.  His chest radiograph showed left lower lobe infiltrate.  Review of Systems: As per HPI otherwise 10 point review of systems negative.   Past Medical History:  Diagnosis Date  . Cocaine abuse ([place])   . ETOH abuse   . Stroke (cerebrum) ([place]) 06/2018    Past Surgical History:  Procedure Laterality Date  . SUPRAVENTRICULAR TACHYCARDIA ABLATION  06/2018     reports that  he has been smoking cigarettes. He has been smoking about 0.50 packs per day. He has never used smokeless tobacco. He reports that he drinks about 2.0 standard drinks of alcohol per week. He reports that he has current or past drug history. Drug: Cocaine.  No Known Allergies  Family history  Father had hypertension.  Prior to Admission medications   Medication Sig Start Date End Date Taking? Authorizing Provider  Alum & Mag Hydroxide-Simeth (ANTACID ADVANCED PO) Take 1 tablet by mouth daily.   Yes [provider]  aspirin 325 MG tablet Take 1 tablet (325 mg total) by mouth daily. 07/16/18  Yes Barton Dubois, MD  atorvastatin (LIPITOR) 10 MG tablet Take 1 tablet (10 mg total) by mouth daily at 6 PM. 07/15/18  Yes Barton Dubois, MD  diltiazem (CARDIZEM CD) 120 MG 24 hr capsule Take 1 capsule (120 mg total) by mouth daily. 07/15/18 07/15/19 Yes Barton Dubois, MD  Multiple Vitamin (MULTIVITAMIN WITH MINERALS) TABS tablet Take 1 tablet by mouth daily.   Yes [provider]    Physical Exam: Vitals:   08/21/18 2059 08/21/18 2100 08/21/18 2126 08/21/18 2218  BP: (!) 126/92   (!) 129/91  Pulse: (!) 122   100  Resp: 18   (!) 23  Temp: 100.2 F (37.9 C)     TempSrc: Oral     SpO2: 98%   95%  Weight:  59.4 kg    Height:   5\' 9"  (1.753 m)  Constitutional: Looks acutely ill.  Mildly febrile. Eyes: PERRL, lids and conjunctivae normal ENMT: Mucous membranes are moist. Posterior pharynx clear of any exudate or lesions. Neck: normal, supple, no masses, no thyromegaly Respiratory: Decreased breath sounds on bases left > right, left base crackles, no wheezing, no crackles. Normal respiratory effort. No accessory muscle use.  Cardiovascular: Tachycardic at 103 bpm, no murmurs / rubs / gallops. No extremity edema. 2+ pedal pulses. No carotid bruits.  Abdomen: Soft, no tenderness, no masses palpated. No hepatosplenomegaly. Bowel sounds positive.  Musculoskeletal: no clubbing /  cyanosis. Good ROM, no contractures. Normal muscle tone.  Skin: no rashes, lesions, ulcers on limited examination. Neurologic: CN 2-12 grossly intact. Sensation intact, DTR normal.  4/5 right-sided hemiparesis. Psychiatric: Normal judgment and insight. Alert and oriented x 3. Normal mood.   Labs on Admission: I have personally reviewed following labs and imaging studies  CBC: Recent Labs  Lab 08/21/18 2143  WBC 14.4*  NEUTROABS 12.9*  HGB 13.3  HCT 43.1  MCV 84.7  PLT 324   Basic Metabolic Panel: Recent Labs  Lab 08/21/18 2143  NA 136  K 3.7  CL 101  CO2 25  GLUCOSE 125*  BUN 12  CREATININE 0.78  CALCIUM 8.7*   GFR: Estimated Creatinine Clearance: 84.6 mL/min (by C-G formula based on SCr of 0.78 mg/dL). Liver Function Tests: Recent Labs  Lab 08/21/18 2143  AST 207*  ALT 666*  ALKPHOS 95  BILITOT 0.6  PROT 7.3  ALBUMIN 3.7   No results for input(s): LIPASE, AMYLASE in the last 168 hours. No results for input(s): AMMONIA in the last 168 hours. Coagulation Profile: No results for input(s): INR, PROTIME in the last 168 hours. Cardiac Enzymes: No results for input(s): CKTOTAL, CKMB, CKMBINDEX, TROPONINI in the last 168 hours. BNP (last 3 results) No results for input(s): PROBNP in the last 8760 hours. HbA1C: No results for input(s): HGBA1C in the last 72 hours. CBG: No results for input(s): GLUCAP in the last 168 hours. Lipid Profile: No results for input(s): CHOL, HDL, LDLCALC, TRIG, CHOLHDL, LDLDIRECT in the last 72 hours. Thyroid Function Tests: No results for input(s): TSH, T4TOTAL, FREET4, T3FREE, THYROIDAB in the last 72 hours. Anemia Panel: No results for input(s): VITAMINB12, FOLATE, FERRITIN, TIBC, IRON, RETICCTPCT in the last 72 hours. Urine analysis:    Component Value Date/Time   COLORURINE YELLOW 07/12/2018 0913   APPEARANCEUR CLEAR 07/12/2018 0913   LABSPEC >1.046 (H) 07/12/2018 0913   PHURINE 5.0 07/12/2018 0913   GLUCOSEU NEGATIVE  07/12/2018 0913   HGBUR SMALL (A) 07/12/2018 0913   BILIRUBINUR NEGATIVE 07/12/2018 0913   KETONESUR NEGATIVE 07/12/2018 0913   PROTEINUR NEGATIVE 07/12/2018 0913   UROBILINOGEN 0.2 03/03/2012 0205   NITRITE NEGATIVE 07/12/2018 0913   LEUKOCYTESUR NEGATIVE 07/12/2018 0913    Radiological Exams on Admission: Dg Chest 2 View  Result Date: 08/21/2018 CLINICAL DATA:  Chest pain, cough and fever. History of stroke in September. EXAM: CHEST - 2 VIEW COMPARISON:  Chest x-ray dated 07/12/2018. FINDINGS: New ill-defined opacities at the LEFT lung base, suspicious for early developing pneumonia. Heart size and mediastinal contours are stable. No pleural effusions seen. No acute or suspicious osseous finding. IMPRESSION: Probable LEFT lower lobe pneumonia. Electronically Signed   By: Franki Cabot M.D.   On: 08/21/2018 23:20    EKG: Independently reviewed. Vent. rate 83 BPM PR interval * ms QRS duration 87 ms QT/QTc 375/441 ms P-R-T axes 84 72 50 Sinus rhythm Left ventricular hypertrophy Anterior  infarct, old No old tracing to compare  Transthoracic Echocardiography 2018-07-13  LV EF: 60% -   65%  ------------------------------------------------------------------- Indications:      CVA 436.  ------------------------------------------------------------------- History:   PMH:  Cocaine Abuse.  Stroke.  Risk factors:  Current tobacco use. Hypertension. Dyslipidemia.  ------------------------------------------------------------------- Study Conclusions  - Left ventricle: The cavity size was normal. Wall thickness was   increased in a pattern of mild LVH. Systolic function was normal.   The estimated ejection fraction was in the range of 60% to 65%.   Wall motion was normal; there were no regional wall motion   abnormalities. Doppler parameters are consistent with abnormal   left ventricular relaxation (grade 1 diastolic dysfunction). - Atrial septum: No defect or patent foramen  ovale was identified.   Assessment/Plan Principal Problem:   HCAP (healthcare-associated pneumonia) Admit to telemetry/inpatient. Continue supplemental oxygen. Bronchodilators as needed. Cefepime per pharmacy. Vancomycin per pharmacy. Follow-up blood culture and sensitivity. Check sputum Gram stain, culture and sensitivity. Check strep pneumoniae urinary antigen.  Active Problems:   Essential hypertension Continue Cardizem 120 mg p.o. daily.    Dyslipidemia Hold atorvastatin.    Elevated transaminase level Hold atorvastatin. Follow transaminase level. Check RUQ ultrasound Check acute hepatitis panel.       DVT prophylaxis: Heparin SQ. Code Status: Full code. Family Communication: Disposition Plan: Admit for HCAP treatment with IV antibiotics for several days. Consults called: Admission status: Inpatient telemetry.   Reubin Milan MD [facility] Pager [phone].  If 7PM-7AM, please contact night-coverage [email or web address] Password [email or web address]  08/21/2018, 11:55 PM

## 2018-08-21 NOTE — ED Provider Notes (Addendum)
[facility] EMERGENCY DEPARTMENT Provider Note   CSN: [id number] Arrival date & time: 08/21/18  2046     History   Chief Complaint Chief Complaint  Patient presents with  . Chest Pain    HPI Evan Chavez is a 57 y.o. male.  Patient status post stroke back in September.  Has some residual right-sided weakness from this.  Just got a rehab 2 weeks ago.  Patient brought in for onset today of chest pain substernal associated with fevers chest congestion cough.  No abdominal pain no nausea vomiting or diarrhea.  Temperature upon arrival was 100.2.  They did note that he had a fever at home.     Past Medical History:  Diagnosis Date  . Cocaine abuse ([place])   . ETOH abuse   . Stroke (cerebrum) ([place]) 06/2018    Patient Active Problem List   Diagnosis Date Noted  . Atrial tachycardia ([place]) 07/13/2018  . Dyslipidemia 07/13/2018  . Acute ischemic stroke ([place]) 07/12/2018  . Essential hypertension 07/12/2018  . Tobacco abuse 07/12/2018  . Cocaine abuse ([place]) 07/12/2018  . Ischemic stroke [facility])     Past Surgical History:  Procedure Laterality Date  . SUPRAVENTRICULAR TACHYCARDIA ABLATION  06/2018        Home Medications    Prior to Admission medications   Medication Sig Start Date End Date Taking? Authorizing Provider  Alum & Mag Hydroxide-Simeth (ANTACID ADVANCED PO) Take 1 tablet by mouth daily.   Yes [provider]  aspirin 325 MG tablet Take 1 tablet (325 mg total) by mouth daily. 07/16/18  Yes Barton Dubois, MD  atorvastatin (LIPITOR) 10 MG tablet Take 1 tablet (10 mg total) by mouth daily at 6 PM. 07/15/18  Yes Barton Dubois, MD  diltiazem (CARDIZEM CD) 120 MG 24 hr capsule Take 1 capsule (120 mg total) by mouth daily. 07/15/18 07/15/19 Yes Barton Dubois, MD  Multiple Vitamin (MULTIVITAMIN WITH MINERALS) TABS tablet Take 1 tablet by mouth daily.   Yes [provider]    Family History History reviewed. No pertinent family history.  Social  History Social History   Tobacco Use  . Smoking status: Current Every Day Smoker    Packs/day: 0.50    Types: Cigarettes  . Smokeless tobacco: Never Used  Substance Use Topics  . Alcohol use: Yes    Alcohol/week: 2.0 standard drinks    Types: 2 Cans of beer per week    Comment: weekly  . Drug use: Yes    Types: Cocaine    Comment: denies 07/12/18     Allergies   Patient has no known allergies.   Review of Systems Review of Systems  Constitutional: Positive for fever.  HENT: Positive for congestion. Negative for sore throat.   Eyes: Negative for redness.  Respiratory: Positive for cough.   Cardiovascular: Positive for chest pain.  Gastrointestinal: Negative for abdominal pain, diarrhea, nausea and vomiting.  Genitourinary: Negative for dysuria and hematuria.  Musculoskeletal: Negative for myalgias.  Skin: Negative for rash.  Neurological: Positive for weakness. Negative for headaches.  Hematological: Does not bruise/bleed easily.  Psychiatric/Behavioral: Negative for confusion.     Physical Exam Updated Vital Signs BP (!) 129/91   Pulse 100   Temp 100.2 F (37.9 C) (Oral)   Resp (!) 23   Ht 1.753 m ('5\' 9"' )   Wt 59.4 kg   SpO2 95%   BMI 19.35 kg/m   Physical Exam  Constitutional: He is oriented to person, place, and time. He  appears well-developed and well-nourished. No distress.  HENT:  Head: Normocephalic and atraumatic.  Mouth/Throat: Oropharynx is clear and moist.  Eyes: Pupils are equal, round, and reactive to light. Conjunctivae and EOM are normal.  Neck: Neck supple.  Cardiovascular: Regular rhythm.  Tachycardic  Pulmonary/Chest: Effort normal and breath sounds normal. No respiratory distress. He has no wheezes. He has no rales.  Abdominal: Soft. Bowel sounds are normal. There is no tenderness.  Musculoskeletal: Normal range of motion.  Neurological: He is alert and oriented to person, place, and time. No cranial nerve deficit.  Right-sided  weakness compared to left 4 out of 5 for arm and leg.  No cranial nerve deficit.  Skin: Skin is warm.  Nursing note and vitals reviewed.    ED Treatments / Results  Labs (all labs ordered are listed, but only abnormal results are displayed) Labs Reviewed  COMPREHENSIVE METABOLIC PANEL - Abnormal; Notable for the following components:      Result Value   Glucose, Bld 125 (*)    Calcium 8.7 (*)    AST 207 (*)    ALT 666 (*)    All other components within normal limits  CBC WITH DIFFERENTIAL/PLATELET - Abnormal; Notable for the following components:   WBC 14.4 (*)    Neutro Abs 12.9 (*)    Lymphs Abs 0.5 (*)    Abs Immature Granulocytes 0.10 (*)    All other components within normal limits  CULTURE, BLOOD (ROUTINE X 2)  CULTURE, BLOOD (ROUTINE X 2)  URINALYSIS, ROUTINE W REFLEX MICROSCOPIC  INFLUENZA PANEL BY PCR (TYPE A & B)  I-STAT TROPONIN, ED  I-STAT CG4 LACTIC ACID, ED  I-STAT CG4 LACTIC ACID, ED    EKG None  Radiology No results found.  Procedures Procedures (including critical care time)  CRITICAL CARE Performed by: Fredia Sorrow Total critical care time: 30 minutes Critical care time was exclusive of separately billable procedures and treating other patients. Critical care was necessary to treat or prevent imminent or life-threatening deterioration. Critical care was time spent personally by me on the following activities: development of treatment plan with patient and/or surrogate as well as nursing, discussions with consultants, evaluation of patient's response to treatment, examination of patient, obtaining history from patient or surrogate, ordering and performing treatments and interventions, ordering and review of laboratory studies, ordering and review of radiographic studies, pulse oximetry and re-evaluation of patient's condition.   Medications Ordered in ED Medications  0.9 %  sodium chloride infusion (1,000 mLs Intravenous New Bag/Given 08/21/18  2144)  metroNIDAZOLE (FLAGYL) IVPB 500 mg (500 mg Intravenous New Bag/Given 08/21/18 2220)  vancomycin (VANCOCIN) IVPB 1000 mg/200 mL premix (1,000 mg Intravenous New Bag/Given 08/21/18 2219)  ceFEPIme (MAXIPIME) 2 g in sodium chloride 0.9 % 100 mL IVPB (2 g Intravenous New Bag/Given 08/21/18 2221)     Initial Impression / Assessment and Plan / ED Course  I have reviewed the triage vital signs and the nursing notes.  Pertinent labs & imaging results that were available during my care of the patient were reviewed by me and considered in my medical decision making (see chart for details).     Patient with chest pain but more likely chest wall pain.  Had cough congestion fever concern is for pneumonia.  Patient recently got a rehab following stroke in September.  He has residual right arm and right leg weakness.  Patient based on the fever and tachycardia and also increased respiratory rate met septic criteria.  So order  set was obtained.  Patient not hypotensive.  Lactic acid not elevated so patient did not receive the 30 cc/kg fluid challenge.  However patient did receive some fluids for the tachycardia.  Patient mentally alert.  But ill-appearing.  Patient started on broad-spectrum antibiotics for unknown source.  Although suspicious about pneumonia.  Since he was in a rehab facility.  If he does have pneumonia this would be healthcare acquired.  As mentioned chest x-ray still pending.  Patient does have a leukocytosis.  Had marked elevation in liver function test but alk phos and total bili was normal.  However liver enzymes were less than thousand.  Patient's troponin was negative.  Flu screening was ordered.  Most likely patient is going to require admission.  Patient just got out of rehab 2 weeks ago.  Chest x-ray looks like probably left-sided pneumonia.  Was clinically suspicious for this.  Will discuss with hospitalist for admission.  Initial antibiotics were broad-spectrum.  They can be  narrowed down to an H CAP pneumonia treatment plan.  Influenza was negative.  Final Clinical Impressions(s) / ED Diagnoses   Final diagnoses:  Precordial pain  Cough  Fever, unspecified fever cause    ED Discharge Orders    None       Fredia Sorrow, MD 08/21/18 2312    Fredia Sorrow, MD 08/21/18 2330

## 2018-08-22 ENCOUNTER — Encounter (HOSPITAL_COMMUNITY): Payer: Self-pay | Admitting: Internal Medicine

## 2018-08-22 ENCOUNTER — Other Ambulatory Visit: Payer: Self-pay

## 2018-08-22 ENCOUNTER — Inpatient Hospital Stay (HOSPITAL_COMMUNITY): Payer: Medicaid Other

## 2018-08-22 DIAGNOSIS — A419 Sepsis, unspecified organism: Secondary | ICD-10-CM

## 2018-08-22 DIAGNOSIS — R072 Precordial pain: Secondary | ICD-10-CM

## 2018-08-22 DIAGNOSIS — R6520 Severe sepsis without septic shock: Secondary | ICD-10-CM

## 2018-08-22 DIAGNOSIS — J181 Lobar pneumonia, unspecified organism: Secondary | ICD-10-CM

## 2018-08-22 DIAGNOSIS — R509 Fever, unspecified: Secondary | ICD-10-CM

## 2018-08-22 LAB — COMPREHENSIVE METABOLIC PANEL
ALK PHOS: 80 U/L (ref 38–126)
ALT: 516 U/L — ABNORMAL HIGH (ref 0–44)
ANION GAP: 8 (ref 5–15)
AST: 124 U/L — ABNORMAL HIGH (ref 15–41)
Albumin: 3.1 g/dL — ABNORMAL LOW (ref 3.5–5.0)
BUN: 13 mg/dL (ref 6–20)
CALCIUM: 8.4 mg/dL — AB (ref 8.9–10.3)
CO2: 25 mmol/L (ref 22–32)
Chloride: 104 mmol/L (ref 98–111)
Creatinine, Ser: 0.68 mg/dL (ref 0.61–1.24)
GFR calc non Af Amer: >60 mL/min (ref >60)
Glucose, Bld: 99 mg/dL (ref 70–99)
Potassium: 3.8 mmol/L (ref 3.5–5.1)
SODIUM: 137 mmol/L (ref 135–145)
TOTAL PROTEIN: 6.3 g/dL — AB (ref 6.5–8.1)
Total Bilirubin: 0.5 mg/dL (ref 0.3–1.2)

## 2018-08-22 LAB — RAPID URINE DRUG SCREEN, HOSP PERFORMED
Amphetamines: NOT DETECTED
Barbiturates: NOT DETECTED
Benzodiazepines: NOT DETECTED
COCAINE: NOT DETECTED
OPIATES: NOT DETECTED
Tetrahydrocannabinol: NOT DETECTED

## 2018-08-22 LAB — CBC WITH DIFFERENTIAL/PLATELET
Abs Immature Granulocytes: 0.06 10*3/uL (ref 0.00–0.07)
BASOS ABS: 0.1 10*3/uL (ref 0.0–0.1)
BASOS PCT: 0 %
EOS ABS: 0 10*3/uL (ref 0.0–0.5)
Eosinophils Relative: 0 %
HEMATOCRIT: 39.3 % (ref 39.0–52.0)
Hemoglobin: 11.9 g/dL — ABNORMAL LOW (ref 13.0–17.0)
IMMATURE GRANULOCYTES: 0 %
Lymphocytes Relative: 8 %
Lymphs Abs: 1.1 10*3/uL (ref 0.7–4.0)
MCH: 26 pg (ref 26.0–34.0)
MCHC: 30.3 g/dL (ref 30.0–36.0)
MCV: 86 fL (ref 80.0–100.0)
Monocytes Absolute: 0.7 10*3/uL (ref 0.1–1.0)
Monocytes Relative: 5 %
NEUTROS PCT: 87 %
NRBC: 0 % (ref 0.0–0.2)
Neutro Abs: 12.4 10*3/uL — ABNORMAL HIGH (ref 1.7–7.7)
PLATELETS: 284 10*3/uL (ref 150–400)
RBC: 4.57 MIL/uL (ref 4.22–5.81)
RDW: 13.6 % (ref 11.5–15.5)
WBC: 14.3 10*3/uL — AB (ref 4.0–10.5)

## 2018-08-22 LAB — URINALYSIS, COMPLETE (UACMP) WITH MICROSCOPIC
BILIRUBIN URINE: NEGATIVE
Glucose, UA: NEGATIVE mg/dL
KETONES UR: NEGATIVE mg/dL
LEUKOCYTES UA: NEGATIVE
NITRITE: NEGATIVE
Protein, ur: NEGATIVE mg/dL
SPECIFIC GRAVITY, URINE: 1.018 (ref 1.005–1.030)
pH: 7 (ref 5.0–8.0)

## 2018-08-22 LAB — TROPONIN I
Troponin I: <0.03 ng/mL (ref <0.03)
Troponin I: <0.03 ng/mL (ref <0.03)

## 2018-08-22 LAB — D-DIMER, QUANTITATIVE (NOT AT ARMC): D DIMER QUANT: 1.15 ug{FEU}/mL — AB (ref 0.00–0.50)

## 2018-08-22 LAB — STREP PNEUMONIAE URINARY ANTIGEN: Strep Pneumo Urinary Antigen: NEGATIVE

## 2018-08-22 MED ORDER — IPRATROPIUM-ALBUTEROL 0.5-2.5 (3) MG/3ML IN SOLN: 3.0000 mL | RESPIRATORY_TRACT | Status: DC | PRN

## 2018-08-22 MED ORDER — VANCOMYCIN HCL 500 MG IV SOLR
500.0000 mg | Freq: Three times a day (TID) | INTRAVENOUS | Status: DC
  Administered 2018-08-22 – 2018-08-23 (×4): 500 mg via INTRAVENOUS
  Filled 2018-08-22 (×7): qty 500

## 2018-08-22 MED ORDER — SODIUM CHLORIDE 0.9 % IV SOLN
1.0000 g | Freq: Three times a day (TID) | INTRAVENOUS | Status: AC
  Administered 2018-08-22 – 2018-08-25 (×9): 1 g via INTRAVENOUS
  Filled 2018-08-22 (×10): qty 1

## 2018-08-22 MED ORDER — DILTIAZEM HCL ER COATED BEADS 120 MG PO CP24
120.0000 mg | ORAL_CAPSULE | Freq: Every day | ORAL | Status: DC
  Administered 2018-08-22 – 2018-08-26 (×5): 120 mg via ORAL
  Filled 2018-08-22 (×5): qty 1

## 2018-08-22 MED ORDER — ASPIRIN 325 MG PO TABS
325.0000 mg | ORAL_TABLET | Freq: Every day | ORAL | Status: DC
  Administered 2018-08-22 – 2018-08-26 (×5): 325 mg via ORAL
  Filled 2018-08-22 (×5): qty 1

## 2018-08-22 MED ORDER — ALUM & MAG HYDROXIDE-SIMETH 200-200-20 MG/5ML PO SUSP
30.0000 mL | Freq: Four times a day (QID) | ORAL | Status: DC | PRN
  Administered 2018-08-23 – 2018-08-24 (×3): 30 mL via ORAL
  Filled 2018-08-22 (×4): qty 30

## 2018-08-22 MED ORDER — ATORVASTATIN CALCIUM 10 MG PO TABS: 10.0000 mg | ORAL_TABLET | Freq: Every day | ORAL | Status: DC

## 2018-08-22 NOTE — Progress Notes (Signed)
PROGRESS NOTE  Evan Chavez GDJ:[id number] DOB: 16-May-1961 DOA: 08/21/2018 PCP: Patient, No Pcp Per  Brief History:  57 y.o.malewith medical history ofrecent stroke September 2019, hypertension and polysubstance abuse presenting with 2 days of coughing, congestion, subjective fevers and chills and shortness of breath.  The patient was recently discharged from the hospital after a stay from 07/12/2018 through 07/15/2018 after treatment for an acute ischemic stroke.  He was discharged to [facility].  He subsequently returned home approximately 2 weeks prior to this admission.  He denies any recurrent substance abuse or tobacco.  He denies any over-the-counter medicines.  Upon presentation, the patient was noted to have a temperature 100.2 F with tachycardia.  He was stable on room air saturating 98%.  Chest x-ray showed left basilar opacity.  Patient was started on vancomycin, cefepime, and metronidazole for healthcare associated pneumonia.  Assessment/Plan: Sepsis -Present on admission -Lactic acid peaked 1.42 -Continue vancomycin and cefepime -Follow blood cultures -Continue IV fluids  Healthcare associated pneumonia -Continue vancomycin and cefepime -Discontinue metronidazole  Transaminasemia -Discontinue Lipitor temporarily -Urine drug screen -11/10 right upper quadrant ultrasound negative -Hepatitis B surface antigen -Hepatitis C antibody  Atypical chest pain -Likely related to patient's coughing -EKG -Cycle troponins -Ddimer  History of polysubstance abuse -Including cocaine and tobacco -Urine drug screen  Dyslipidemia -Holding atorvastatin until LFTs have improved  SVT/atrial tachycardia -Presently in sinus rhythm -Continue diltiazem  Essential hypertension -Continue diltiazem  Recent Stroke -continue ASA     Disposition Plan:   Home in 1-2 days  Family Communication:   Mother updated at bedside 11/10  Consultants:  none  Code Status:   FULL  DVT Prophylaxis:  [place] Heparin   Procedures: As Listed in Progress Note Above  Antibiotics: None  RN Pressure Injury Documentation:        Subjective: Patient continues to have intermittent chest discomfort particular with coughing.  He has a nonproductive cough but denies any hemoptysis.  Denies any nausea, vomiting, diarrhea, abdominal pain, dysuria, hematuria.  He still has some shortness of breath with exertion.  Denies any fevers, chills, headache, neck pain.  Objective: Vitals:   08/22/18 0230 08/22/18 0245 08/22/18 0300 08/22/18 0519  BP: 123/90  122/87 (!) 132/96  Pulse: 98 99  90  Resp: (!) 30 (!) 22 (!) 23 18  Temp:    98.8 F (37.1 C)  TempSrc:    Oral  SpO2: 99% 99%  97%  Weight:      Height:        Intake/Output Summary (Last 24 hours) at 08/22/2018 1404 Last data filed at 08/22/2018 0900 Gross per 24 hour  Intake 1218.87 ml  Output 400 ml  Net 818.87 ml   Weight change:  Exam:   General:  Pt is alert, follows commands appropriately, not in acute distress  HEENT: No icterus, No thrush, No neck mass, [place]/AT  Cardiovascular: RRR, S1/S2, no rubs, no gallops  Respiratory: Diminished breath sounds bilateral.  Bibasilar rales.  No wheezing.  Abdomen: Soft/+BS, non tender, non distended, no guarding  Extremities: No edema, No lymphangitis, No petechiae, No rashes, no synovitis   Data Reviewed: I have personally reviewed following labs and imaging studies Basic Metabolic Panel: Recent Labs  Lab 08/21/18 2143 08/22/18 0635  NA 136 137  K 3.7 3.8  CL 101 104  CO2 25 25  GLUCOSE 125* 99  BUN 12 13  CREATININE 0.78 0.68  CALCIUM 8.7* 8.4*  Liver Function Tests: Recent Labs  Lab 08/21/18 2143 08/22/18 0635  AST 207* 124*  ALT 666* 516*  ALKPHOS 95 80  BILITOT 0.6 0.5  PROT 7.3 6.3*  ALBUMIN 3.7 3.1*   No results for input(s): LIPASE, AMYLASE in the last 168 hours. No results for input(s): AMMONIA in the last 168  hours. Coagulation Profile: No results for input(s): INR, PROTIME in the last 168 hours. CBC: Recent Labs  Lab 08/21/18 2143 08/22/18 0635  WBC 14.4* 14.3*  NEUTROABS 12.9* 12.4*  HGB 13.3 11.9*  HCT 43.1 39.3  MCV 84.7 86.0  PLT 317 284   Cardiac Enzymes: No results for input(s): CKTOTAL, CKMB, CKMBINDEX, TROPONINI in the last 168 hours. BNP: Invalid input(s): POCBNP CBG: No results for input(s): GLUCAP in the last 168 hours. HbA1C: No results for input(s): HGBA1C in the last 72 hours. Urine analysis:    Component Value Date/Time   COLORURINE YELLOW 07/12/2018 0913   APPEARANCEUR CLEAR 07/12/2018 0913   LABSPEC >1.046 (H) 07/12/2018 0913   PHURINE 5.0 07/12/2018 0913   GLUCOSEU NEGATIVE 07/12/2018 0913   HGBUR SMALL (A) 07/12/2018 0913   BILIRUBINUR NEGATIVE 07/12/2018 0913   KETONESUR NEGATIVE 07/12/2018 0913   PROTEINUR NEGATIVE 07/12/2018 0913   UROBILINOGEN 0.2 03/03/2012 0205   NITRITE NEGATIVE 07/12/2018 0913   LEUKOCYTESUR NEGATIVE 07/12/2018 0913   Sepsis Labs: @LABRCNTIP (procalcitonin:4,lacticidven:4) ) Recent Results (from the past 240 hour(s))  Blood Culture (routine x 2)     Status: None (Preliminary result)   Collection Time: 08/21/18  9:44 PM  Result Value Ref Range Status   Specimen Description BLOOD RIGHT ARM  Final   Special Requests   Final    BOTTLES DRAWN AEROBIC ONLY Blood Culture results may not be optimal due to an inadequate volume of blood received in culture bottles   Culture   Final    NO GROWTH < 12 HOURS Performed at [facility], [address], [place], [place] [address]    Report Status PENDING  Incomplete  Blood Culture (routine x 2)     Status: None (Preliminary result)   Collection Time: 08/21/18 10:00 PM  Result Value Ref Range Status   Specimen Description BLOOD LEFT HAND  Final   Special Requests   Final    BOTTLES DRAWN AEROBIC AND ANAEROBIC Blood Culture results may not be optimal due to an excessive volume of blood  received in culture bottles   Culture   Final    NO GROWTH < 12 HOURS Performed at [facility], [address], [place], [place] [address]    Report Status PENDING  Incomplete     Scheduled Meds: . aspirin  325 mg Oral Daily  . diltiazem  120 mg Oral Daily  . heparin  5,000 Units Subcutaneous Q8H   Continuous Infusions: . sodium chloride 1,000 mL (08/22/18 0736)  . ceFEPime (MAXIPIME) IV 1 g (08/22/18 1216)  . metronidazole 500 mg (08/22/18 1402)  . vancomycin 500 mg (08/22/18 1303)    Procedures/Studies: Dg Chest 2 View  Result Date: 08/21/2018 CLINICAL DATA:  Chest pain, cough and fever. History of stroke in September. EXAM: CHEST - 2 VIEW COMPARISON:  Chest x-ray dated 07/12/2018. FINDINGS: New ill-defined opacities at the LEFT lung base, suspicious for early developing pneumonia. Heart size and mediastinal contours are stable. No pleural effusions seen. No acute or suspicious osseous finding. IMPRESSION: Probable LEFT lower lobe pneumonia. Electronically Signed   By: Franki Cabot M.D.   On: 08/21/2018 23:20  US Abdomen Limited Ruq  Result Date: 08/22/2018 CLINICAL DATA:  Elevated transaminase. EXAM: ULTRASOUND ABDOMEN LIMITED RIGHT UPPER QUADRANT COMPARISON:  None. FINDINGS: Gallbladder: No gallstones or wall thickening visualized. No sonographic Murphy sign noted by sonographer. Common bile duct: Diameter: 3 mm.  Normal. Liver: No focal lesion identified. Within normal limits in parenchymal echogenicity. Portal vein is patent on color Doppler imaging with normal direction of blood flow towards the liver. IMPRESSION: Normal right upper quadrant ultrasound. Electronically Signed   By: Nelson Chimes M.D.   On: 08/22/2018 11:27    Orson Eva, DO  [facility] Pager [phone]  If 7PM-7AM, please contact night-coverage [email or web address] Password [email or web address] 08/22/2018, 2:04 PM   LOS: 1 day

## 2018-08-22 NOTE — ED Notes (Signed)
CONTACT INFO:  Kendal Hymen (mom): [phone]

## 2018-08-22 NOTE — Progress Notes (Addendum)
Pharmacy Antibiotic Note  Evan Chavez is a 57 y.o. male admitted on 08/21/2018 with unknown source.  Pharmacy has been consulted for Vancomycin and cefepime dosing.  Plan: Vancomycin 1000mg  loading dose, then 500mg  IV every 8 hours.  Goal trough 15-20 mcg/mL.  Cefepime 2gm Loading dose, then 1gm IV q8h F/U cxs and clinical progress Monitor V/S and labs  Height: 5\' 9"  (175.3 cm) Weight: 131 lb (59.4 kg) IBW/kg (Calculated) : 70.7  Temp (24hrs), Avg:99.5 F (37.5 C), Min:98.8 F (37.1 C), Max:100.2 F (37.9 C)  Recent Labs  Lab 08/21/18 2143 08/21/18 2151 08/22/18 0635  WBC 14.4*  --  14.3*  CREATININE 0.78  --  0.68  LATICACIDVEN  --  1.42  --     Estimated Creatinine Clearance: 84.6 mL/min (by C-G formula based on SCr of 0.68 mg/dL).    No Known Allergies  Antimicrobials this admission: Vancomycin 11/09 >>  Cefepime 11/09 >>  Flagyl 11/09>>  Dose adjustments this admission: N/A  Microbiology results: 11/9 BCx: pending  MRSA PCR:   Thank you for allowing pharmacy to be a part of this patient's care.  Isac Sarna, BS Pharm D, [place] Clinical Pharmacist Pager [phone] 08/22/2018 11:16 AM

## 2018-08-23 ENCOUNTER — Inpatient Hospital Stay (HOSPITAL_COMMUNITY): Payer: Medicaid Other

## 2018-08-23 LAB — COMPREHENSIVE METABOLIC PANEL
ALT: 400 U/L — ABNORMAL HIGH (ref 0–44)
AST: 128 U/L — AB (ref 15–41)
Albumin: 3 g/dL — ABNORMAL LOW (ref 3.5–5.0)
Alkaline Phosphatase: 71 U/L (ref 38–126)
Anion gap: 7 (ref 5–15)
BUN: 14 mg/dL (ref 6–20)
CHLORIDE: 106 mmol/L (ref 98–111)
CO2: 22 mmol/L (ref 22–32)
Calcium: 8 mg/dL — ABNORMAL LOW (ref 8.9–10.3)
Creatinine, Ser: 0.72 mg/dL (ref 0.61–1.24)
GFR calc Af Amer: >60 mL/min (ref >60)
Glucose, Bld: 95 mg/dL (ref 70–99)
POTASSIUM: 3.3 mmol/L — AB (ref 3.5–5.1)
Sodium: 135 mmol/L (ref 135–145)
Total Bilirubin: 0.9 mg/dL (ref 0.3–1.2)
Total Protein: 6.1 g/dL — ABNORMAL LOW (ref 6.5–8.1)

## 2018-08-23 LAB — HEPATITIS PANEL, ACUTE
HCV Ab: <0.1 s/co ratio (ref 0.0–0.9)
HEP A IGM: NEGATIVE
HEP B C IGM: NEGATIVE
Hepatitis B Surface Ag: NEGATIVE

## 2018-08-23 LAB — URINE CULTURE: Culture: NO GROWTH

## 2018-08-23 LAB — CBC WITH DIFFERENTIAL/PLATELET
Abs Immature Granulocytes: 0.01 10*3/uL (ref 0.00–0.07)
BASOS ABS: 0 10*3/uL (ref 0.0–0.1)
BASOS PCT: 0 %
EOS ABS: 0 10*3/uL (ref 0.0–0.5)
Eosinophils Relative: 1 %
HCT: 36.9 % — ABNORMAL LOW (ref 39.0–52.0)
Hemoglobin: 11.2 g/dL — ABNORMAL LOW (ref 13.0–17.0)
IMMATURE GRANULOCYTES: 0 %
Lymphocytes Relative: 23 %
Lymphs Abs: 1.4 10*3/uL (ref 0.7–4.0)
MCH: 26.1 pg (ref 26.0–34.0)
MCHC: 30.4 g/dL (ref 30.0–36.0)
MCV: 86 fL (ref 80.0–100.0)
MONOS PCT: 8 %
Monocytes Absolute: 0.5 10*3/uL (ref 0.1–1.0)
NEUTROS PCT: 68 %
NRBC: 0 % (ref 0.0–0.2)
Neutro Abs: 4 10*3/uL (ref 1.7–7.7)
PLATELETS: 261 10*3/uL (ref 150–400)
RBC: 4.29 MIL/uL (ref 4.22–5.81)
RDW: 13.5 % (ref 11.5–15.5)
WBC: 5.9 10*3/uL (ref 4.0–10.5)

## 2018-08-23 LAB — HEPATITIS B SURFACE ANTIGEN: HEP B S AG: NEGATIVE

## 2018-08-23 LAB — TROPONIN I: Troponin I: <0.03 ng/mL (ref <0.03)

## 2018-08-23 LAB — HEPATITIS C ANTIBODY

## 2018-08-23 LAB — HIV ANTIBODY (ROUTINE TESTING W REFLEX): HIV SCREEN 4TH GENERATION: NONREACTIVE

## 2018-08-23 MED ORDER — ATORVASTATIN CALCIUM 10 MG PO TABS: 10.0000 mg | ORAL_TABLET | Freq: Every day | ORAL | Status: DC

## 2018-08-23 MED ORDER — POTASSIUM CHLORIDE CRYS ER 20 MEQ PO TBCR
40.0000 meq | EXTENDED_RELEASE_TABLET | Freq: Once | ORAL | Status: AC
  Administered 2018-08-23: 40 meq via ORAL
  Filled 2018-08-23: qty 2

## 2018-08-23 MED ORDER — IOPAMIDOL (ISOVUE-370) INJECTION 76%
100.0000 mL | Freq: Once | INTRAVENOUS | Status: AC | PRN
  Administered 2018-08-23: 100 mL via INTRAVENOUS

## 2018-08-23 NOTE — Care Management Note (Addendum)
Case Management Note  Patient Details  Name: DUNCAN ALEJANDRO MRN: [id number] Date of Birth: 1960/11/20  Subjective/Objective:      HCAP. Recent stay at [facility].              Action/Plan: DC home. Follow up appt made to help with establishing PCP and medicaid application follow up.   Patient's mother Kendal Hymen) updated on plan and appt time. She has address and phone number.   Expected Discharge Date:   08/24/2018               Expected Discharge Plan:  Home/Self Care  In-House Referral:     Discharge planning Services  CM Consult, Follow-up appt scheduled, [facility]  Post Acute Care Choice:    Choice offered to:     DME Arranged:    DME Agency:     HH Arranged:    HH Agency:     Status of Service:  Completed, signed off  If discussed at [employer], dates discussed:    Additional Comments:  Jasiyah Paulding, Chauncey Reading, RN 08/23/2018, 1:45 PM

## 2018-08-23 NOTE — Progress Notes (Signed)
PROGRESS NOTE  Evan Chavez RJJ:[id number] DOB: 1961/05/27 DOA: 08/21/2018 PCP: Patient, No Pcp Per  Brief History:  57 y.o.malewith medical history ofrecent stroke September 2019, hypertension and polysubstance abuse presenting with 2 days of coughing, congestion, subjective fevers and chills and shortness of breath.  The patient was recently discharged from the hospital after a stay from 07/12/2018 through 07/15/2018 after treatment for an acute ischemic stroke.  He was discharged to [facility].  He subsequently returned home approximately 2 weeks prior to this admission.  He denies any recurrent substance abuse or tobacco.  He denies any over-the-counter medicines.  Upon presentation, the patient was noted to have a temperature 100.2 F with tachycardia.  He was stable on room air saturating 98%.  Chest x-ray showed left basilar opacity.  Patient was started on vancomycin, cefepime, and metronidazole for healthcare associated pneumonia.  Assessment/Plan: Sepsis -Present on admission -Lactic acid peaked 1.42 -Continue vancomycin and cefepime -Follow blood cultures--neg -Continue IV fluids  Healthcare associated pneumonia -Continue cefepime -Discontinue metronidazole -discontinue vanco -blood cultures neg  Transaminasemia -Discontinue Lipitor temporarily -Urine drug screen--neg -11/10 right upper quadrant ultrasound negative -Hepatitis B surface antigen--neg -Hepatitis C antibody--neg  Atypical chest pain -Likely related to patient's coughing -EKG--personally reviewed--no STT changes -Cycle troponins--neg x 3 -Ddimer--1.15 -CTA chest--NEG PE; bilateral LL air space opacities L>R;  Abnormal soft tissue fullness in the posterior mediastinum in the region of the mid to distal esophagus. It is difficult to evaluate esophagus due to this amorphous appearance. This could be related to an esophageal abnormality or possibly abnormal lymph nodes   History of  polysubstance abuse -Including cocaine and tobacco -11/10--Urine drug screen--neg  Dyslipidemia -Holding atorvastatin until LFTs have improved  SVT/atrial tachycardia -Presently in sinus rhythm -Continue diltiazem  Essential hypertension -Continue diltiazem  Recent Stroke -continue ASA  Hypokalemia -replete -check mag     Disposition Plan:   Home in 1-2 days  Family Communication:   Mother updated at bedside 11/11  Consultants:  none  Code Status:  FULL  DVT Prophylaxis:  [place] Heparin   Procedures: As Listed in Progress Note Above  Antibiotics: Cefepime 11/9>>> vanco 11/9>>11/11 Flagyl>>11/9>>11/11     Subjective: Patient denies fevers, chills, headache, chest pain, dyspnea, nausea, vomiting, diarrhea, abdominal pain, dysuria, hematuria, hematochezia, and melena. Still has nonproductive cough  Objective: Vitals:   08/22/18 2130 08/23/18 0527 08/23/18 0947 08/23/18 1349  BP: 127/76 135/77 128/79 140/89  Pulse: 98 83 83 71  Resp: 19 18  18   Temp: 98.8 F (37.1 C) 98.4 F (36.9 C)  98.5 F (36.9 C)  TempSrc: Oral Oral    SpO2: 99% 97%  100%  Weight:      Height:        Intake/Output Summary (Last 24 hours) at 08/23/2018 1640 Last data filed at 08/23/2018 1300 Gross per 24 hour  Intake 2913.37 ml  Output 500 ml  Net 2413.37 ml   Weight change:  Exam:   General:  Pt is alert, follows commands appropriately, not in acute distress  HEENT: No icterus, No thrush, No neck mass, [place]/AT  Cardiovascular: RRR, S1/S2, no rubs, no gallops  Respiratory: diminshed BS bilateral.  Bibasilar rales  Abdomen: Soft/+BS, non tender, non distended, no guarding  Extremities: No edema, No lymphangitis, No petechiae, No rashes, no synovitis   Data Reviewed: I have personally reviewed following labs and imaging studies Basic Metabolic Panel: Recent Labs  Lab 08/21/18 2143 08/22/18 0160  08/23/18 0204  NA 136 137 135  K 3.7 3.8 3.3*  CL  101 104 106  CO2 25 25 22   GLUCOSE 125* 99 95  BUN 12 13 14   CREATININE 0.78 0.68 0.72  CALCIUM 8.7* 8.4* 8.0*   Liver Function Tests: Recent Labs  Lab 08/21/18 2143 08/22/18 0635 08/23/18 0204  AST 207* 124* 128*  ALT 666* 516* 400*  ALKPHOS 95 80 71  BILITOT 0.6 0.5 0.9  PROT 7.3 6.3* 6.1*  ALBUMIN 3.7 3.1* 3.0*   No results for input(s): LIPASE, AMYLASE in the last 168 hours. No results for input(s): AMMONIA in the last 168 hours. Coagulation Profile: No results for input(s): INR, PROTIME in the last 168 hours. CBC: Recent Labs  Lab 08/21/18 2143 08/22/18 0635 08/23/18 0204  WBC 14.4* 14.3* 5.9  NEUTROABS 12.9* 12.4* 4.0  HGB 13.3 11.9* 11.2*  HCT 43.1 39.3 36.9*  MCV 84.7 86.0 86.0  PLT 317 284 261   Cardiac Enzymes: Recent Labs  Lab 08/22/18 1444 08/22/18 2027 08/23/18 0203  TROPONINI <0.03 <0.03 <0.03   BNP: Invalid input(s): POCBNP CBG: No results for input(s): GLUCAP in the last 168 hours. HbA1C: No results for input(s): HGBA1C in the last 72 hours. Urine analysis:    Component Value Date/Time   COLORURINE YELLOW 08/22/2018 1500   APPEARANCEUR CLEAR 08/22/2018 1500   LABSPEC 1.018 08/22/2018 1500   PHURINE 7.0 08/22/2018 1500   GLUCOSEU NEGATIVE 08/22/2018 1500   HGBUR SMALL (A) 08/22/2018 1500   BILIRUBINUR NEGATIVE 08/22/2018 1500   KETONESUR NEGATIVE 08/22/2018 1500   PROTEINUR NEGATIVE 08/22/2018 1500   UROBILINOGEN 0.2 03/03/2012 0205   NITRITE NEGATIVE 08/22/2018 1500   LEUKOCYTESUR NEGATIVE 08/22/2018 1500   Sepsis Labs: @LABRCNTIP (procalcitonin:4,lacticidven:4) ) Recent Results (from the past 240 hour(s))  Blood Culture (routine x 2)     Status: None (Preliminary result)   Collection Time: 08/21/18  9:44 PM  Result Value Ref Range Status   Specimen Description BLOOD RIGHT ARM  Final   Special Requests   Final    BOTTLES DRAWN AEROBIC ONLY Blood Culture results may not be optimal due to an inadequate volume of blood received  in culture bottles   Culture   Final    NO GROWTH 2 DAYS Performed at [facility], [address], [place], [place] [address]    Report Status PENDING  Incomplete  Blood Culture (routine x 2)     Status: None (Preliminary result)   Collection Time: 08/21/18 10:00 PM  Result Value Ref Range Status   Specimen Description BLOOD LEFT HAND  Final   Special Requests   Final    BOTTLES DRAWN AEROBIC AND ANAEROBIC Blood Culture results may not be optimal due to an excessive volume of blood received in culture bottles   Culture   Final    NO GROWTH 2 DAYS Performed at [facility], [address] [place], [place] [address]    Report Status PENDING  Incomplete  Culture, Urine     Status: None   Collection Time: 08/22/18  3:00 PM  Result Value Ref Range Status   Specimen Description   Final    URINE, CLEAN CATCH Performed at [facility], [address], [place], [place] [address]    Special Requests   Final    NONE Performed at [facility], [address], [place], [place] [address]    Culture   Final    NO GROWTH Performed at [facility], [place]  [address] [place], [place] [address]    Report Status 08/23/2018 FINAL  Final     Scheduled Meds: . aspirin  325 mg Oral Daily  . diltiazem  120 mg Oral Daily  . heparin  5,000 Units Subcutaneous Q8H   Continuous Infusions: . sodium chloride 1,000 mL (08/23/18 1355)  . ceFEPime (MAXIPIME) IV 1 g (08/23/18 1221)  . vancomycin 500 mg (08/23/18 1126)    Procedures/Studies: Dg Chest 2 View  Result Date: 08/21/2018 CLINICAL DATA:  Chest pain, cough and fever. History of stroke in September. EXAM: CHEST - 2 VIEW COMPARISON:  Chest x-ray dated 07/12/2018. FINDINGS: New ill-defined opacities at the LEFT lung base, suspicious for early developing pneumonia. Heart size and mediastinal contours are stable. No pleural effusions seen. No acute or suspicious osseous finding. IMPRESSION: Probable LEFT lower lobe pneumonia. Electronically Signed    By: Franki Cabot M.D.   On: 08/21/2018 23:20   Ct Angio Chest Pe W Or Wo Contrast  Result Date: 08/23/2018 CLINICAL DATA:  Chest pain, shortness of breath, elevated D-dimer EXAM: CT ANGIOGRAPHY CHEST WITH CONTRAST TECHNIQUE: Multidetector CT imaging of the chest was performed using the standard protocol during bolus administration of intravenous contrast. Multiplanar CT image reconstructions and MIPs were obtained to evaluate the vascular anatomy. CONTRAST:  130mL ISOVUE-370 IOPAMIDOL (ISOVUE-370) INJECTION 76% COMPARISON:  Chest CT 08/21/2018 FINDINGS: Cardiovascular: No filling defects in the pulmonary arteries to suggest pulmonary emboli. Heart is borderline in size. Scattered coronary artery and aortic calcifications. No evidence of aortic aneurysm. Mediastinum/Nodes: Abnormal appearing soft tissue noted in the posterior mediastinum in the region of the mid and distal esophagus below the carina to the hiatus. It is difficult to evaluate the esophagus due to this amorphous appearance. Mildly prominent bilateral hilar lymph nodes, likely reactive. The largest is a right hilar lymph node measuring 11 mm on image 49. Lungs/Pleura: Layering mucus noted within the distal trachea and proximal right mainstem bronchus. Airspace opacities are present in both lower lobes, left greater than right concerning for pneumonia. No effusions. Upper Abdomen: Imaging into the upper abdomen shows no acute findings. Musculoskeletal: Chest wall soft tissues are unremarkable. No acute bony abnormality. Review of the MIP images confirms the above findings. IMPRESSION: Airspace opacities in both lower lobes, left greater than right concerning for pneumonia. No evidence of pulmonary embolus. Layering mucus/debris in the distal trachea and proximal right mainstem bronchus. Abnormal soft tissue fullness in the posterior mediastinum in the region of the mid to distal esophagus. It is difficult to evaluate the esophagus due to this  amorphous appearance. This could be related to an esophageal abnormality or possibly abnormal lymph nodes (felt less likely). Consider further evaluation with elective standard CT chest with contrast (not CTA) with oral contrast (this should be done on a separate day from today's test due to the presence of intravenous contrast now in the system), or elective esophagram. Coronary artery disease. Electronically Signed   By: Rolm Baptise M.D.   On: 08/23/2018 11:01   US Abdomen Limited Ruq  Result Date: 08/22/2018 CLINICAL DATA:  Elevated transaminase. EXAM: ULTRASOUND ABDOMEN LIMITED RIGHT UPPER QUADRANT COMPARISON:  None. FINDINGS: Gallbladder: No gallstones or wall thickening visualized. No sonographic Murphy sign noted by sonographer. Common bile duct: Diameter: 3 mm.  Normal. Liver: No focal lesion identified. Within normal limits in parenchymal echogenicity. Portal vein is patent on color Doppler imaging with normal direction of blood flow towards the liver. IMPRESSION: Normal right upper quadrant ultrasound. Electronically Signed  By: Nelson Chimes M.D.   On: 08/22/2018 11:27    Orson Eva, DO  [facility] Pager [phone]  If 7PM-7AM, please contact night-coverage [email or web address] Password [email or web address] 08/23/2018, 4:40 PM   LOS: 2 days

## 2018-08-24 ENCOUNTER — Inpatient Hospital Stay (HOSPITAL_COMMUNITY): Payer: Medicaid Other

## 2018-08-24 ENCOUNTER — Encounter (HOSPITAL_COMMUNITY): Payer: Self-pay | Admitting: Gastroenterology

## 2018-08-24 DIAGNOSIS — K2100 Gastro-esophageal reflux disease with esophagitis, without bleeding: Secondary | ICD-10-CM

## 2018-08-24 DIAGNOSIS — J189 Pneumonia, unspecified organism: Secondary | ICD-10-CM

## 2018-08-24 DIAGNOSIS — R740 Nonspecific elevation of levels of transaminase and lactic acid dehydrogenase [LDH]: Secondary | ICD-10-CM

## 2018-08-24 LAB — COMPREHENSIVE METABOLIC PANEL
ALBUMIN: 2.8 g/dL — AB (ref 3.5–5.0)
ALK PHOS: 66 U/L (ref 38–126)
ALT: 304 U/L — ABNORMAL HIGH (ref 0–44)
ANION GAP: 5 (ref 5–15)
AST: 90 U/L — ABNORMAL HIGH (ref 15–41)
BUN: 10 mg/dL (ref 6–20)
CALCIUM: 8.2 mg/dL — AB (ref 8.9–10.3)
CHLORIDE: 113 mmol/L — AB (ref 98–111)
CO2: 20 mmol/L — ABNORMAL LOW (ref 22–32)
Creatinine, Ser: 0.68 mg/dL (ref 0.61–1.24)
GFR calc non Af Amer: >60 mL/min (ref >60)
GLUCOSE: 88 mg/dL (ref 70–99)
POTASSIUM: 3.4 mmol/L — AB (ref 3.5–5.1)
SODIUM: 138 mmol/L (ref 135–145)
Total Bilirubin: 0.3 mg/dL (ref 0.3–1.2)
Total Protein: 5.8 g/dL — ABNORMAL LOW (ref 6.5–8.1)

## 2018-08-24 LAB — MAGNESIUM: MAGNESIUM: 2 mg/dL (ref 1.7–2.4)

## 2018-08-24 MED ORDER — PANTOPRAZOLE SODIUM 40 MG PO TBEC
40.0000 mg | DELAYED_RELEASE_TABLET | Freq: Two times a day (BID) | ORAL | Status: DC
  Administered 2018-08-24 – 2018-08-26 (×5): 40 mg via ORAL
  Filled 2018-08-24 (×5): qty 1

## 2018-08-24 MED ORDER — POTASSIUM CHLORIDE CRYS ER 20 MEQ PO TBCR
20.0000 meq | EXTENDED_RELEASE_TABLET | Freq: Once | ORAL | Status: AC
  Administered 2018-08-24: 20 meq via ORAL
  Filled 2018-08-24: qty 1

## 2018-08-24 MED ORDER — AMOXICILLIN-POT CLAVULANATE 875-125 MG PO TABS
1.0000 | ORAL_TABLET | Freq: Two times a day (BID) | ORAL | Status: DC
  Administered 2018-08-25 – 2018-08-26 (×3): 1 via ORAL
  Filled 2018-08-24 (×3): qty 1

## 2018-08-24 MED ORDER — ONDANSETRON HCL 4 MG/2ML IJ SOLN
4.0000 mg | Freq: Four times a day (QID) | INTRAMUSCULAR | Status: DC | PRN
  Administered 2018-08-24: 4 mg via INTRAVENOUS
  Filled 2018-08-24: qty 2

## 2018-08-24 NOTE — Consult Note (Addendum)
Referring Provider: Dr. Carles Collet  Primary Care Physician:  Patient, No Pcp Per Primary Gastroenterologist:  Dr. Oneida Alar   Date of Admission: 08/21/18 Date of Consultation:   Reason for Consultation:  Esophagitis/Esophageal stricture  HPI:  Evan Chavez is a 57 y.o. year old male with history of cocaine and ETOH abuse, hospitalized for acute ischemic stroke 07/12/2018 and underwent physical therapy at SNF, presenting this admission with sepsis secondary to pneumonia. Due to pleuritic chest pain, CTA chest completed with abnormal soft tissue fullness in region of mid to distal esophagus. BPE completed with HOB at 30 degrees as patient unable to stand. Mucosal thickening of mid to distal thoracic esophagus consistent with esophagitis, focal area of relative narrowing of esophagus over 5 cm length unable to rule out severe esophagitis or neoplasm. Diffuse esophageal dysmotility. New onset elevated transaminases, ALT > AST this admission. Acute hepatitis panel negative. Negative drug screen this admission. Positive for cocaine one month ago. Transaminases improving.   Denies any prior endoscopy or colonoscopy. No chronic use of PPI. Notes chronic GERD. Has taken Tums or similar as outpatient. Notes chest discomfort for the past few weeks, unrelated to eating or drinking. Denies dysphagia or odynophagia. Notes poor appetite, mild upper abdominal discomfort intermittently. Vague historian. Family at bedside and states he was eating well last week at home. Denies any current ETOH use since last admission. He is unsure last cocaine use but denies any since last admission.   Weight 145 last admission (Sept/October). Currently 131.     Past Medical History:  Diagnosis Date  . Cocaine abuse ([place])   . ETOH abuse   . Stroke (cerebrum) ([place]) 06/2018    History reviewed. No pertinent surgical history.  Prior to Admission medications   Medication Sig Start Date End Date Taking? Authorizing Provider  Alum &  Mag Hydroxide-Simeth (ANTACID ADVANCED PO) Take 1 tablet by mouth daily.   Yes [provider]  aspirin 325 MG tablet Take 1 tablet (325 mg total) by mouth daily. 07/16/18  Yes Barton Dubois, MD  diltiazem (CARDIZEM CD) 120 MG 24 hr capsule Take 1 capsule (120 mg total) by mouth daily. 07/15/18 07/15/19 Yes Barton Dubois, MD  Multiple Vitamin (MULTIVITAMIN WITH MINERALS) TABS tablet Take 1 tablet by mouth daily.   Yes [provider]  atorvastatin (LIPITOR) 10 MG tablet Take 1 tablet (10 mg total) by mouth daily at 6 PM. Do NOT restart until followed up with PCP 08/23/18   Orson Eva, MD    Current Facility-Administered Medications  Medication Dose Route Frequency Provider Last Rate Last Dose  . alum & mag hydroxide-simeth (MAALOX/MYLANTA) 200-200-20 MG/5ML suspension 30 mL  30 mL Oral Q6H PRN Reubin Milan, MD   30 mL at 08/24/18 1311  . aspirin tablet 325 mg  325 mg Oral Daily Reubin Milan, MD   325 mg at 08/24/18 1109  . ceFEPIme (MAXIPIME) 1 g in sodium chloride 0.9 % 100 mL IVPB  1 g Intravenous Franco Collet, MD 200 mL/hr at 08/24/18 1126 1 g at 08/24/18 1126  . diltiazem (CARDIZEM CD) 24 hr capsule 120 mg  120 mg Oral Daily Reubin Milan, MD   120 mg at 08/24/18 1109  . heparin injection 5,000 Units  5,000 Units Subcutaneous Q8H Reubin Milan, MD   5,000 Units at 08/24/18 1312  . ipratropium-albuterol (DUONEB) 0.5-2.5 (3) MG/3ML nebulizer solution 3 mL  3 mL Nebulization Q4H PRN Reubin Milan, MD  Allergies as of 08/21/2018  . (No Known Allergies)    Family History  Problem Relation Age of Onset  . Hypertension Father   . Colon cancer Neg Hx   . Colon polyps Neg Hx     Social History   Socioeconomic History  . Marital status: Single    Spouse name: Not on file  . Number of children: Not on file  . Years of education: Not on file  . Highest education level: Not on file  Occupational History  . Not on file  Social Needs   . Financial resource strain: Not on file  . Food insecurity:    Worry: Not on file    Inability: Not on file  . Transportation needs:    Medical: Not on file    Non-medical: Not on file  Tobacco Use  . Smoking status: Former Smoker    Packs/day: 0.50    Types: Cigarettes    Last attempt to quit: 07/13/2018    Years since quitting: 0.1  . Smokeless tobacco: Never Used  Substance and Sexual Activity  . Alcohol use: Yes    Alcohol/week: 2.0 standard drinks    Types: 2 Cans of beer per week    Comment: weekly  . Drug use: Yes    Types: Cocaine    Comment: denies 07/12/18  . Sexual activity: Yes  Lifestyle  . Physical activity:    Days per week: Not on file    Minutes per session: Not on file  . Stress: Not on file  Relationships  . Social connections:    Talks on phone: Not on file    Gets together: Not on file    Attends religious service: Not on file    Active member of club or organization: Not on file    Attends meetings of clubs or organizations: Not on file    Relationship status: Not on file  . Intimate partner violence:    Fear of current or ex partner: Not on file    Emotionally abused: Not on file    Physically abused: Not on file    Forced sexual activity: Not on file  Other Topics Concern  . Not on file  Social History Narrative  . Not on file    Review of Systems: Gen: see HPI  CV: see HPI  Resp: Denies shortness of breath with rest, cough, wheezing GI: see HPI  GU : Denies urinary burning, urinary frequency, urinary incontinence.  MS: Denies joint pain,swelling, cramping Derm: Denies rash, itching, dry skin Psych: +depression Heme: Denies bruising, bleeding, and enlarged lymph nodes.  Physical Exam: Vital signs in last 24 hours: Temp:  [98.5 F (36.9 C)-98.7 F (37.1 C)] 98.7 F (37.1 C) (11/12 0552) Pulse Rate:  [65-73] 65 (11/12 1415) Resp:  [16] 16 (11/12 1415) BP: (124-132)/(75-89) 124/80 (11/12 1415) SpO2:  [94 %-98 %] 98 % (11/12  1415) Last BM Date: 08/22/18 General:   Alert, flat affect, answers yes or no to questions, does not maintain eye contact, appears depressed.  Head:  Normocephalic and atraumatic. Eyes:  Sclera clear, no icterus.    Ears:  Normal auditory acuity. Nose:  No deformity, discharge,  or lesions. Mouth:  Possible oral thrush  Lungs:  Clear throughout to auscultation.  Heart:  S1 S2 present without murmurs  Abdomen:  Soft, nontender and nondistended. No masses, hepatosplenomegaly or hernias noted. Normal bowel sounds, without guarding, and without rebound.   Rectal:  Deferred until time of colonoscopy.  Msk:  Symmetrical without gross deformities. Normal posture. Extremities:  Without edema. Neurologic:  Alert and  oriented x4 Skin:  Intact without significant lesions or rashes. Psych: Cooperative but with flat affect  Intake/Output from previous day: 11/11 0701 - 11/12 0700 In: 600 [P.O.:600] Out: 500 [Urine:500] Intake/Output this shift: Total I/O In: 480 [P.O.:480] Out: -   Lab Results: Recent Labs    08/21/18 2143 08/22/18 0635 08/23/18 0204  WBC 14.4* 14.3* 5.9  HGB 13.3 11.9* 11.2*  HCT 43.1 39.3 36.9*  PLT 317 284 261   BMET Recent Labs    08/22/18 0635 08/23/18 0204 08/24/18 0514  NA 137 135 138  K 3.8 3.3* 3.4*  CL 104 106 113*  CO2 25 22 20*  GLUCOSE 99 95 88  BUN 13 14 10   CREATININE 0.68 0.72 0.68  CALCIUM 8.4* 8.0* 8.2*   LFT Recent Labs    08/22/18 0635 08/23/18 0204 08/24/18 0514  PROT 6.3* 6.1* 5.8*  ALBUMIN 3.1* 3.0* 2.8*  AST 124* 128* 90*  ALT 516* 400* 304*  ALKPHOS 80 71 66  BILITOT 0.5 0.9 0.3   Hepatitis Panel Recent Labs    08/22/18 0635 08/22/18 1444  HEPBSAG Negative Negative  HCVAB <0.1 <0.1  HEPAIGM Negative  --   HEPBIGM Negative  --     Studies/Results: Ct Angio Chest Pe W Or Wo Contrast  Result Date: 08/23/2018 CLINICAL DATA:  Chest pain, shortness of breath, elevated D-dimer EXAM: CT ANGIOGRAPHY CHEST WITH  CONTRAST TECHNIQUE: Multidetector CT imaging of the chest was performed using the standard protocol during bolus administration of intravenous contrast. Multiplanar CT image reconstructions and MIPs were obtained to evaluate the vascular anatomy. CONTRAST:  173mL ISOVUE-370 IOPAMIDOL (ISOVUE-370) INJECTION 76% COMPARISON:  Chest CT 08/21/2018 FINDINGS: Cardiovascular: No filling defects in the pulmonary arteries to suggest pulmonary emboli. Heart is borderline in size. Scattered coronary artery and aortic calcifications. No evidence of aortic aneurysm. Mediastinum/Nodes: Abnormal appearing soft tissue noted in the posterior mediastinum in the region of the mid and distal esophagus below the carina to the hiatus. It is difficult to evaluate the esophagus due to this amorphous appearance. Mildly prominent bilateral hilar lymph nodes, likely reactive. The largest is a right hilar lymph node measuring 11 mm on image 49. Lungs/Pleura: Layering mucus noted within the distal trachea and proximal right mainstem bronchus. Airspace opacities are present in both lower lobes, left greater than right concerning for pneumonia. No effusions. Upper Abdomen: Imaging into the upper abdomen shows no acute findings. Musculoskeletal: Chest wall soft tissues are unremarkable. No acute bony abnormality. Review of the MIP images confirms the above findings. IMPRESSION: Airspace opacities in both lower lobes, left greater than right concerning for pneumonia. No evidence of pulmonary embolus. Layering mucus/debris in the distal trachea and proximal right mainstem bronchus. Abnormal soft tissue fullness in the posterior mediastinum in the region of the mid to distal esophagus. It is difficult to evaluate the esophagus due to this amorphous appearance. This could be related to an esophageal abnormality or possibly abnormal lymph nodes (felt less likely). Consider further evaluation with elective standard CT chest with contrast (not CTA) with  oral contrast (this should be done on a separate day from today's test due to the presence of intravenous contrast now in the system), or elective esophagram. Coronary artery disease. Electronically Signed   By: Rolm Baptise M.D.   On: 08/23/2018 11:01   Dg Esophagus  Result Date: 08/24/2018 CLINICAL DATA:  Abnormal CT exam with  suspected esophageal wall thickening versus adenopathy EXAM: ESOPHOGRAM/BARIUM SWALLOW TECHNIQUE: Single contrast examination was performed using thin barium. Patient also swallowed a 12.5 mm diameter barium tablet. FLUOROSCOPY TIME:  Fluoroscopy Time:  3 minutes 42 seconds Radiation Exposure Index (if provided by the fluoroscopic device): 58.1 mGy Number of Acquired Spot Images: multiple fluoroscopic screen captures COMPARISON:  CT chest 08/23/2018 FINDINGS: Examination performed with head of bed elevated 30 degrees; patient unable to stand. Esophageal distention: Area of relative narrowing of the mid to distal thoracic esophagus. Remainder of esophagus appears normal in caliber. Filling defects:  Wall thickening, see below 12.5 mm barium tablet: Passed from oral cavity to the distal esophagus. Tablet was unable to pass into the stomach but this appeared to be related to impaired motility and lack of upright positioning rather than due to stricture. Motility:  Diffuse age-related esophageal dysmotility Mucosa: Diffuse mucosal thickening of the mid to distal esophagus. An area of relative luminal narrowing of the esophagus is identified at the mid to distal thoracic portion with irregular walls, could represent severe soft a gyrus or a mass. Additional wall thickening is seen more distally extending to the gastroesophageal junction region. Hypopharynx/cervical esophagus: Laryngeal penetration to the level of the vocal cords without gross aspiration Hiatal hernia:  Absent GE reflux:  Not witnessed during exam Other:  N/A IMPRESSION: Mucosal thickening of the mid to distal thoracic  esophagus consistent with esophagitis. Focal area of relative narrowing of the esophagus is identified estimated over 5 cm length which could represent an area of more severe mucosal edema/esophagitis or an esophageal neoplasm; endoscopic assessment recommended to exclude tumor. Diffusely impaired esophageal motility. Laryngeal penetration without gross aspiration. Electronically Signed   By: Lavonia Dana M.D.   On: 08/24/2018 10:39    Impression: 57 year old male with history of drug and alcohol abuse, prior ischemic stroke Sept 30, 2019 s/p hospitalization and rehabilitation, presenting with sepsis secondary to pneumonia. Clinically improved from this standpoint. Due to chest pain, CTA completed and abnormal esophagus noted. BPE then completed with thickening of mid to distal thoracic esophagus and focal area of relative narrowing over 5 cm length unable to rule out severe esophagitis or neoplasm. Long-standing history of GERD and weight loss documented since last admission. Poor appetite noted but denies dysphagia or odynophagia. Possible oral thrush on exam. No chronic PPI.  Needs endoscopic direct visualization. Anticipate he would need to have Propofol. As of note, he was negative for cocaine this admission, with last positive drug screen during last hospitalization. Discussed risks and benefits of EGD with stated understanding by patient and family members.   Elevated transaminases, ALT >AST: Although acute hepatitis panel negative, at risk for Hep C. Will check Hep C RNA. Transaminases improving since hospitalization.     Plan: NPO after midnight to reassess in the morning Hold tomorrow's dosing of Heparin: will need to be restarted after EGD Add PPI BID Nystatin swish and swallow Hep C RNA ordered Follow transaminases EGD this admission, anticipate need with Propofol.    Annitta Needs, PhD, ANP-BC [facility]astro[facility]     LOS: 3 days    08/24/2018, 3:12 PM

## 2018-08-24 NOTE — Progress Notes (Signed)
PROGRESS NOTE  Evan Chavez ASN:[id number] DOB: 1961/02/08 DOA: 08/21/2018 PCP: Patient, No Pcp Per  Brief History:  57 y.o.malewith medical history ofrecent stroke September 2019,hypertension and polysubstance abuse presenting with2 days of coughing, congestion, subjective fevers and chills and shortness of breath. The patient was recently discharged from the hospital after a stay from 07/12/2018 through 07/15/2018 after treatment for an acute ischemic stroke. He was discharged to [facility]. He subsequently returned home approximately 2 weeks prior to this admission. He denies any recurrent substance abuse or tobacco. He denies any over-the-counter medicines. Upon presentation, the patient was noted to have a temperature 100.2 F with tachycardia. He was stable on room air saturating 98%. Chest x-ray showed left basilar opacity. Patient was started on vancomycin, cefepime, and metronidazole for healthcare associated pneumonia.  Assessment/Plan: Sepsis -Present on admission -Lactic acid peaked 1.42 -started vancomycin and cefepime -d/c vanco -Follow blood cultures--neg -Continue IV fluids  Healthcare associated pneumonia -Continue cefepime>>>transition to po amox/clav 11/13 -Discontinue metronidazole -discontinue vanco -blood cultures neg -d/c home with amox/clav x 3 more days to complete one week  Dysphagia/Esphagitis -11/12--esophagram--mucosal thickening in mid-distal esophagus; focal narrowing >5 cm in length; esphageal dysmotility -GI consult appreciated>>>plan EGD 11/13 -start PPI  Transaminasemia -Discontinue Lipitor temporarily -Urine drug screen--neg -11/10 right upper quadrant ultrasound negative -Hepatitis B surface antigen--neg -Hepatitis C antibody--neg -trending down -f/u hep C RNA  Atypical chest pain -Likely related to patient's coughing -EKG--personally reviewed--no STT changes -Cycle troponins--neg x 3 -Ddimer--1.15 -CTA  chest--NEG PE; bilateral LL air space opacities L>R;Abnormal soft tissue fullness in the posterior mediastinum in the region of the mid to distal esophagus. It is difficult to evaluate esophagus due to this amorphous appearance. This could be related to an esophageal abnormality or possibly abnormal lymph nodes -11/12--esophagram  History of polysubstance abuse -Including cocaine and tobacco -positive cocaine in sept 2019 -11/10--Urine drug screen--neg  Dyslipidemia -Holding atorvastatin until LFTs have improved -follow up with PCP for restart atorvastatin  SVT/atrial tachycardia -Presently in sinus rhythm -Continue diltiazem  Essential hypertension -Continue diltiazem  Recent Stroke -continue ASA  Hypokalemia -repleted -check mag--2.0     Disposition Plan: Home when cleared by GI Family Communication:Mother updatedat bedside 11/12  Consultants:[place]  Code Status: FULL  DVT Prophylaxis: [place] Heparin   Procedures: As Listed in Progress Note Above  Antibiotics: Amox/clav 11/13>>> Cefepime 11/9>>>11/13 vanco 11/9>>11/11 Flagyl>>11/9>>11/11     Subjective: Pt c/o dysphagia without odynophagia.  Denies f/c, cp, sob, n/v/d, abd pain. No hematochezia or melena  Objective: Vitals:   08/23/18 2120 08/24/18 0552 08/24/18 0818 08/24/18 1415  BP: 132/89 126/75  124/80  Pulse: 73 71  65  Resp:    16  Temp: 98.5 F (36.9 C) 98.7 F (37.1 C)    TempSrc: Oral Oral    SpO2: 97% 96% 94% 98%  Weight:      Height:        Intake/Output Summary (Last 24 hours) at 08/24/2018 1550 Last data filed at 08/24/2018 1400 Gross per 24 hour  Intake 480 ml  Output 500 ml  Net -20 ml   Weight change:  Exam:   General:  Pt is alert, follows commands appropriately, not in acute distress  HEENT: No icterus, No thrush, No neck mass, [place]/AT  Cardiovascular: RRR, S1/S2, no rubs, no gallops  Respiratory: bibasilar rales without wheeze  Abdomen:  Soft/+BS, non tender, non distended, no guarding  Extremities: No edema, No lymphangitis, No petechiae,  No rashes, no synovitis   Data Reviewed: I have personally reviewed following labs and imaging studies Basic Metabolic Panel: Recent Labs  Lab 08/21/18 2143 08/22/18 0635 08/23/18 0204 08/24/18 0514  NA 136 137 135 138  K 3.7 3.8 3.3* 3.4*  CL 101 104 106 113*  CO2 25 25 22  20*  GLUCOSE 125* 99 95 88  BUN 12 13 14 10   CREATININE 0.78 0.68 0.72 0.68  CALCIUM 8.7* 8.4* 8.0* 8.2*  MG  --   --   --  2.0   Liver Function Tests: Recent Labs  Lab 08/21/18 2143 08/22/18 0635 08/23/18 0204 08/24/18 0514  AST 207* 124* 128* 90*  ALT 666* 516* 400* 304*  ALKPHOS 95 80 71 66  BILITOT 0.6 0.5 0.9 0.3  PROT 7.3 6.3* 6.1* 5.8*  ALBUMIN 3.7 3.1* 3.0* 2.8*   No results for input(s): LIPASE, AMYLASE in the last 168 hours. No results for input(s): AMMONIA in the last 168 hours. Coagulation Profile: No results for input(s): INR, PROTIME in the last 168 hours. CBC: Recent Labs  Lab 08/21/18 2143 08/22/18 0635 08/23/18 0204  WBC 14.4* 14.3* 5.9  NEUTROABS 12.9* 12.4* 4.0  HGB 13.3 11.9* 11.2*  HCT 43.1 39.3 36.9*  MCV 84.7 86.0 86.0  PLT 317 284 261   Cardiac Enzymes: Recent Labs  Lab 08/22/18 1444 08/22/18 2027 08/23/18 0203  TROPONINI <0.03 <0.03 <0.03   BNP: Invalid input(s): POCBNP CBG: No results for input(s): GLUCAP in the last 168 hours. HbA1C: No results for input(s): HGBA1C in the last 72 hours. Urine analysis:    Component Value Date/Time   COLORURINE YELLOW 08/22/2018 1500   APPEARANCEUR CLEAR 08/22/2018 1500   LABSPEC 1.018 08/22/2018 1500   PHURINE 7.0 08/22/2018 1500   GLUCOSEU NEGATIVE 08/22/2018 1500   HGBUR SMALL (A) 08/22/2018 1500   BILIRUBINUR NEGATIVE 08/22/2018 1500   KETONESUR NEGATIVE 08/22/2018 1500   PROTEINUR NEGATIVE 08/22/2018 1500   UROBILINOGEN 0.2 03/03/2012 0205   NITRITE NEGATIVE 08/22/2018 1500   LEUKOCYTESUR NEGATIVE  08/22/2018 1500   Sepsis Labs: @LABRCNTIP (procalcitonin:4,lacticidven:4) ) Recent Results (from the past 240 hour(s))  Blood Culture (routine x 2)     Status: None (Preliminary result)   Collection Time: 08/21/18  9:44 PM  Result Value Ref Range Status   Specimen Description BLOOD RIGHT ARM  Final   Special Requests   Final    BOTTLES DRAWN AEROBIC ONLY Blood Culture results may not be optimal due to an inadequate volume of blood received in culture bottles   Culture   Final    NO GROWTH 3 DAYS Performed at [facility], [address] [place], [place] [address]    Report Status PENDING  Incomplete  Blood Culture (routine x 2)     Status: None (Preliminary result)   Collection Time: 08/21/18 10:00 PM  Result Value Ref Range Status   Specimen Description BLOOD LEFT HAND  Final   Special Requests   Final    BOTTLES DRAWN AEROBIC AND ANAEROBIC Blood Culture results may not be optimal due to an excessive volume of blood received in culture bottles   Culture   Final    NO GROWTH 3 DAYS Performed at [facility], [address], [place], [place] [address]    Report Status PENDING  Incomplete  Culture, Urine     Status: None   Collection Time: 08/22/18  3:00 PM  Result Value Ref Range Status   Specimen Description   Final    URINE,  CLEAN CATCH Performed at [facility], [address] [place], [place] [address]    Special Requests   Final    NONE Performed at [facility], [address] [place], [place] [address]    Culture   Final    NO GROWTH Performed at [facility], [place] [address], [place], [place] [address]    Report Status 08/23/2018 FINAL  Final     Scheduled Meds: . aspirin  325 mg Oral Daily  . diltiazem  120 mg Oral Daily  . heparin  5,000 Units Subcutaneous Q8H  . pantoprazole  40 mg Oral BID AC   Continuous Infusions: . ceFEPime (MAXIPIME) IV 1 g (08/24/18 1126)    Procedures/Studies: Dg Chest 2 View  Result Date: 08/21/2018 CLINICAL DATA:  Chest  pain, cough and fever. History of stroke in September. EXAM: CHEST - 2 VIEW COMPARISON:  Chest x-ray dated 07/12/2018. FINDINGS: New ill-defined opacities at the LEFT lung base, suspicious for early developing pneumonia. Heart size and mediastinal contours are stable. No pleural effusions seen. No acute or suspicious osseous finding. IMPRESSION: Probable LEFT lower lobe pneumonia. Electronically Signed   By: Franki Cabot M.D.   On: 08/21/2018 23:20   Ct Angio Chest Pe W Or Wo Contrast  Result Date: 08/23/2018 CLINICAL DATA:  Chest pain, shortness of breath, elevated D-dimer EXAM: CT ANGIOGRAPHY CHEST WITH CONTRAST TECHNIQUE: Multidetector CT imaging of the chest was performed using the standard protocol during bolus administration of intravenous contrast. Multiplanar CT image reconstructions and MIPs were obtained to evaluate the vascular anatomy. CONTRAST:  113mL ISOVUE-370 IOPAMIDOL (ISOVUE-370) INJECTION 76% COMPARISON:  Chest CT 08/21/2018 FINDINGS: Cardiovascular: No filling defects in the pulmonary arteries to suggest pulmonary emboli. Heart is borderline in size. Scattered coronary artery and aortic calcifications. No evidence of aortic aneurysm. Mediastinum/Nodes: Abnormal appearing soft tissue noted in the posterior mediastinum in the region of the mid and distal esophagus below the carina to the hiatus. It is difficult to evaluate the esophagus due to this amorphous appearance. Mildly prominent bilateral hilar lymph nodes, likely reactive. The largest is a right hilar lymph node measuring 11 mm on image 49. Lungs/Pleura: Layering mucus noted within the distal trachea and proximal right mainstem bronchus. Airspace opacities are present in both lower lobes, left greater than right concerning for pneumonia. No effusions. Upper Abdomen: Imaging into the upper abdomen shows no acute findings. Musculoskeletal: Chest wall soft tissues are unremarkable. No acute bony abnormality. Review of the MIP images  confirms the above findings. IMPRESSION: Airspace opacities in both lower lobes, left greater than right concerning for pneumonia. No evidence of pulmonary embolus. Layering mucus/debris in the distal trachea and proximal right mainstem bronchus. Abnormal soft tissue fullness in the posterior mediastinum in the region of the mid to distal esophagus. It is difficult to evaluate the esophagus due to this amorphous appearance. This could be related to an esophageal abnormality or possibly abnormal lymph nodes (felt less likely). Consider further evaluation with elective standard CT chest with contrast (not CTA) with oral contrast (this should be done on a separate day from today's test due to the presence of intravenous contrast now in the system), or elective esophagram. Coronary artery disease. Electronically Signed   By: Rolm Baptise M.D.   On: 08/23/2018 11:01   Dg Esophagus  Result Date: 08/24/2018 CLINICAL DATA:  Abnormal CT exam with suspected esophageal wall thickening versus adenopathy EXAM: ESOPHOGRAM/BARIUM SWALLOW TECHNIQUE: Single contrast examination was performed using thin barium. Patient also  swallowed a 12.5 mm diameter barium tablet. FLUOROSCOPY TIME:  Fluoroscopy Time:  3 minutes 42 seconds Radiation Exposure Index (if provided by the fluoroscopic device): 58.1 mGy Number of Acquired Spot Images: multiple fluoroscopic screen captures COMPARISON:  CT chest 08/23/2018 FINDINGS: Examination performed with head of bed elevated 30 degrees; patient unable to stand. Esophageal distention: Area of relative narrowing of the mid to distal thoracic esophagus. Remainder of esophagus appears normal in caliber. Filling defects:  Wall thickening, see below 12.5 mm barium tablet: Passed from oral cavity to the distal esophagus. Tablet was unable to pass into the stomach but this appeared to be related to impaired motility and lack of upright positioning rather than due to stricture. Motility:  Diffuse  age-related esophageal dysmotility Mucosa: Diffuse mucosal thickening of the mid to distal esophagus. An area of relative luminal narrowing of the esophagus is identified at the mid to distal thoracic portion with irregular walls, could represent severe soft a gyrus or a mass. Additional wall thickening is seen more distally extending to the gastroesophageal junction region. Hypopharynx/cervical esophagus: Laryngeal penetration to the level of the vocal cords without gross aspiration Hiatal hernia:  Absent GE reflux:  Not witnessed during exam Other:  N/A IMPRESSION: Mucosal thickening of the mid to distal thoracic esophagus consistent with esophagitis. Focal area of relative narrowing of the esophagus is identified estimated over 5 cm length which could represent an area of more severe mucosal edema/esophagitis or an esophageal neoplasm; endoscopic assessment recommended to exclude tumor. Diffusely impaired esophageal motility. Laryngeal penetration without gross aspiration. Electronically Signed   By: Lavonia Dana M.D.   On: 08/24/2018 10:39   US Abdomen Limited Ruq  Result Date: 08/22/2018 CLINICAL DATA:  Elevated transaminase. EXAM: ULTRASOUND ABDOMEN LIMITED RIGHT UPPER QUADRANT COMPARISON:  None. FINDINGS: Gallbladder: No gallstones or wall thickening visualized. No sonographic Murphy sign noted by sonographer. Common bile duct: Diameter: 3 mm.  Normal. Liver: No focal lesion identified. Within normal limits in parenchymal echogenicity. Portal vein is patent on color Doppler imaging with normal direction of blood flow towards the liver. IMPRESSION: Normal right upper quadrant ultrasound. Electronically Signed   By: Nelson Chimes M.D.   On: 08/22/2018 11:27    Orson Eva, DO  [facility] Pager [phone]  If 7PM-7AM, please contact night-coverage [email or web address] Password [email or web address] 08/24/2018, 3:50 PM   LOS: 3 days

## 2018-08-25 ENCOUNTER — Inpatient Hospital Stay (HOSPITAL_COMMUNITY): Payer: Medicaid Other | Admitting: Anesthesiology

## 2018-08-25 ENCOUNTER — Encounter (HOSPITAL_COMMUNITY): Payer: Self-pay | Admitting: *Deleted

## 2018-08-25 ENCOUNTER — Encounter (HOSPITAL_COMMUNITY)
Admission: EM | Disposition: A | Discharge status: Home / Self Care | Payer: Self-pay | Source: Home / Self Care | Attending: Internal Medicine

## 2018-08-25 HISTORY — PX: BIOPSY: SHX5522

## 2018-08-25 HISTORY — PX: ESOPHAGOGASTRODUODENOSCOPY (EGD) WITH PROPOFOL: SHX5813

## 2018-08-25 LAB — BASIC METABOLIC PANEL
ANION GAP: 6 (ref 5–15)
BUN: 10 mg/dL (ref 6–20)
CALCIUM: 7.9 mg/dL — AB (ref 8.9–10.3)
CO2: 25 mmol/L (ref 22–32)
Chloride: 107 mmol/L (ref 98–111)
Creatinine, Ser: 0.67 mg/dL (ref 0.61–1.24)
GLUCOSE: 88 mg/dL (ref 70–99)
POTASSIUM: 3.6 mmol/L (ref 3.5–5.1)
Sodium: 138 mmol/L (ref 135–145)

## 2018-08-25 LAB — HCV RNA QUANT RFLX ULTRA OR GENOTYP
HCV RNA Qnt(log copy/mL): UNDETERMINED log10 IU/mL
HepC Qn: NOT DETECTED IU/mL

## 2018-08-25 LAB — CBC
HCT: 33.3 % — ABNORMAL LOW (ref 39.0–52.0)
Hemoglobin: 10.3 g/dL — ABNORMAL LOW (ref 13.0–17.0)
MCH: 26.8 pg (ref 26.0–34.0)
MCHC: 30.9 g/dL (ref 30.0–36.0)
MCV: 86.5 fL (ref 80.0–100.0)
PLATELETS: 265 10*3/uL (ref 150–400)
RBC: 3.85 MIL/uL — AB (ref 4.22–5.81)
RDW: 13.5 % (ref 11.5–15.5)
WBC: 4.8 10*3/uL (ref 4.0–10.5)
nRBC: 0 % (ref 0.0–0.2)

## 2018-08-25 SURGERY — ESOPHAGOGASTRODUODENOSCOPY (EGD) WITH PROPOFOL
Anesthesia: Monitor Anesthesia Care

## 2018-08-25 MED ORDER — MEPERIDINE HCL 100 MG/ML IJ SOLN: 6.2500 mg | INTRAMUSCULAR | Status: DC | PRN

## 2018-08-25 MED ORDER — PROPOFOL 500 MG/50ML IV EMUL
INTRAVENOUS | Status: DC | PRN
  Administered 2018-08-25: 100 ug/kg/min via INTRAVENOUS

## 2018-08-25 MED ORDER — PROPOFOL 10 MG/ML IV BOLUS
INTRAVENOUS | Status: DC | PRN
  Administered 2018-08-25: 20 mg via INTRAVENOUS
  Administered 2018-08-25: 10 mg via INTRAVENOUS
  Administered 2018-08-25 (×2): 20 mg via INTRAVENOUS

## 2018-08-25 MED ORDER — PROMETHAZINE HCL 25 MG/ML IJ SOLN: 6.2500 mg | INTRAMUSCULAR | Status: DC | PRN

## 2018-08-25 MED ORDER — SODIUM CHLORIDE 0.9 % IV SOLN
INTRAVENOUS | Status: DC
  Administered 2018-08-25: 14:00:00 via INTRAVENOUS

## 2018-08-25 MED ORDER — HYDROCODONE-ACETAMINOPHEN 7.5-325 MG PO TABS: 1.0000 | ORAL_TABLET | Freq: Once | ORAL | Status: DC | PRN

## 2018-08-25 MED ORDER — HYDROMORPHONE HCL 1 MG/ML IJ SOLN: 0.2500 mg | INTRAMUSCULAR | Status: DC | PRN

## 2018-08-25 MED ORDER — LACTATED RINGERS IV SOLN
INTRAVENOUS | Status: DC
  Administered 2018-08-25: 15:00:00 via INTRAVENOUS

## 2018-08-25 MED ORDER — SUCRALFATE 1 GM/10ML PO SUSP
1.0000 g | Freq: Three times a day (TID) | ORAL | Status: DC
  Administered 2018-08-25 – 2018-08-26 (×5): 1 g via ORAL
  Filled 2018-08-25 (×6): qty 10

## 2018-08-25 MED ORDER — MIDAZOLAM HCL 5 MG/5ML IJ SOLN
INTRAMUSCULAR | Status: DC | PRN
  Administered 2018-08-25: 2 mg via INTRAVENOUS

## 2018-08-25 MED ORDER — MIDAZOLAM HCL 2 MG/2ML IJ SOLN
INTRAMUSCULAR | Status: AC
  Filled 2018-08-25: qty 2

## 2018-08-25 MED ORDER — LACTATED RINGERS IV SOLN
INTRAVENOUS | Status: DC
  Administered 2018-08-25: 13:00:00 via INTRAVENOUS

## 2018-08-25 NOTE — Progress Notes (Signed)
Patient seen in short stay.  Appropriate for EGD today.  He denies dysphagia.  Imaging reviewed. Agree with need for EGD.  Possible esophageal dilation as feasible/appropriate as discussed with patient and his mother.  The risks, benefits, limitations, alternatives and imponderables have been reviewed with the patient. Potential for esophageal dilation, biopsy, etc. have also been reviewed.  Questions have been answered. All parties agreeable.  Further recommendations to follow.

## 2018-08-25 NOTE — Op Note (Signed)
[facility] Patient Name: Evan Chavez Procedure Date: 08/25/2018 1:27 PM MRN: [id number] Date of Birth: Nov 18, 1960 Attending MD: Norvel Richards , MD CSN: [id number] Age: 57 Admit Type: Inpatient Procedure:                Upper GI endoscopy Indications:              Abnormal UGI series, Abnormal CT of the GI tract Providers:                Norvel Richards, MD, Gwynneth Albright RN,                            RN, Nelma Rothman, Technician Referring MD:              Medicines:                Monitored Anesthesia Care Complications:            No immediate complications. Estimated blood loss:                            Minimal. Estimated Blood Loss:     Estimated blood loss was minimal. Procedure:                Pre-Anesthesia Assessment:                           - Prior to the procedure, a History and Physical                            was performed, and patient medications and                            allergies were reviewed. The patient's tolerance of                            previous anesthesia was also reviewed. The risks                            and benefits of the procedure and the sedation                            options and risks were discussed with the patient.                            All questions were answered, and informed consent                            was obtained. Prior Anticoagulants: The patient                            last took heparin on the day of the procedure. ASA                            Grade Assessment: III - A patient with severe  systemic disease. After reviewing the risks and                            benefits, the patient was deemed in satisfactory                            condition to undergo the procedure.                           After obtaining informed consent, the endoscope was                            passed under direct vision. Throughout the                            procedure,  the patient's blood pressure, pulse, and                            oxygen saturations were monitored continuously. The                            GIF-H190 ([id number]) scope was introduced through the                            and advanced to the third part of duodenum. The                            upper GI endoscopy was accomplished without                            difficulty. The patient tolerated the procedure                            well. Scope In: 1:49:25 PM Scope Out: 2:02:45 PM Total Procedure Duration: 0 hours 13 minutes 20 seconds  Findings:      Markedly abnormal esophagus. An 8 cm segment in the distal third of the       esophagus was severely denuded, inflamed mucosa with overlying exudate;       there was a "shelf" present just above the GE junction. There was mild       encroachment on the lumen through this segment but the tubular esophagus       remain widely patent. Small hiatal hernia. Severely eroded gastric       mucosa diffusely. Erosions extended through the pylorus. Extensive       geographic ulceration of the duodenal bulb extending into the proximal       second portion. Erosions extended well into the second portion of the       duodenum. Biopsies of the, esophagus, stomach and duodenum taken for       histologic study. Impression:               -Severely inflamed abnormal appearing mid/distal                            esophagus. Encroachment on lumen suspicious for  infiltrating neoplasm but could all be severe                            benign inflammation?"biopsied. Extensive gastric                            erosions?"status post biopsy. Extensive duodenal                            ulceration with satellite erosions?"status post                            biopsy. Query ischemic process versus other. May be                            multifactorial. Moderate Sedation:      Moderate (conscious) sedation was personally  administered by an       anesthesia professional. The following parameters were monitored: oxygen       saturation, heart rate, blood pressure, respiratory rate, EKG, adequacy       of pulmonary ventilation, and response to care. Recommendation:           - Return patient to hospital ward for ongoing care.                           - Chopped diet.                           - Continue present medications. Continue twice                            daily PPI. Add Carafate suspension 1 g 4 times                            daily x5 days.                           - Await pathology results.                           - Repeat upper endoscopy for surveillance based on                            pathology results. Procedure Code(s):        --- Professional ---                           [phone], Esophagogastroduodenoscopy, flexible,                            transoral; with biopsy, single or multiple Diagnosis Code(s):        --- Professional ---                           K20.9, Esophagitis, unspecified  R93.3, Abnormal findings on diagnostic imaging of                            other parts of digestive tract CPT copyright 2018 American [employer] Association. All rights reserved. The codes documented in this report are preliminary and upon coder review may  be revised to meet current compliance requirements. [name]. [name], MD Norvel Richards, MD 08/25/2018 2:18:26 PM This report has been signed electronically. Number of Addenda: 0

## 2018-08-25 NOTE — Anesthesia Preprocedure Evaluation (Signed)
Anesthesia Evaluation    Airway Mallampati: III       Dental  (+) Teeth Intact   Pulmonary pneumonia, former smoker,           Cardiovascular hypertension, On Medications      Neuro/Psych PSYCHIATRIC DISORDERS CVA    GI/Hepatic   Endo/Other    Renal/GU      Musculoskeletal   Abdominal   Peds  Hematology   Anesthesia Other Findings Admit 11/9 with precoridal pain, pneumonia, cough, fever ETOH abuse, Cocaine abuse (last w/CVA 9/19) Ischemic CVA 07/02/18 with right sided weakness, possible aspiration Tobacco abuse  Drug screen negative 08/22/18 NSR at 34, likely h/o ant infarct  Reproductive/Obstetrics                             Anesthesia Physical Anesthesia Plan  ASA: IV  Anesthesia Plan: MAC   Post-op Pain Management:    Induction:   PONV Risk Score and Plan:   Airway Management Planned:   Additional Equipment:   Intra-op Plan:   Post-operative Plan:   Informed Consent:   Plan Discussed with: Anesthesiologist  Anesthesia Plan Comments:         Anesthesia Quick Evaluation

## 2018-08-25 NOTE — Anesthesia Postprocedure Evaluation (Signed)
Anesthesia Post Note  Patient: Evan Chavez  Procedure(s) Performed: ESOPHAGOGASTRODUODENOSCOPY (EGD) WITH PROPOFOL (N/A ) BIOPSY  Patient location during evaluation: PACU Anesthesia Type: MAC Level of consciousness: awake and alert and oriented Pain management: pain level controlled Vital Signs Assessment: post-procedure vital signs reviewed and stable Respiratory status: spontaneous breathing Cardiovascular status: blood pressure returned to baseline Postop Assessment: no apparent nausea or vomiting Anesthetic complications: no     Last Vitals:  Vitals:   08/25/18 1415 08/25/18 1430  BP: 123/86 123/85  Pulse: 78 68  Resp: 15 (!) 23  Temp:    SpO2: 94% 97%    Last Pain:  Vitals:   08/25/18 1412  TempSrc:   PainSc: 0-No pain                 Kijana Cromie

## 2018-08-25 NOTE — Transfer of Care (Signed)
Immediate Anesthesia Transfer of Care Note  Patient: Evan Chavez  Procedure(s) Performed: ESOPHAGOGASTRODUODENOSCOPY (EGD) WITH PROPOFOL (N/A )  Patient Location: PACU  Anesthesia Type:MAC  Level of Consciousness: awake  Airway & Oxygen Therapy: Patient Spontanous Breathing  Post-op Assessment: Report given to RN  Post vital signs: Reviewed  Last Vitals:  Vitals Value Taken Time  BP 120/77 08/25/2018  2:11 PM  Temp    Pulse 72 08/25/2018  2:14 PM  Resp 21 08/25/2018  2:14 PM  SpO2 95 % 08/25/2018  2:14 PM  Vitals shown include unvalidated device data.  Last Pain:  Vitals:   08/25/18 1305  TempSrc: Oral  PainSc: 0-No pain      Patients Stated Pain Goal: 5 (85/02/77 4128)  Complications: No apparent anesthesia complications

## 2018-08-25 NOTE — Progress Notes (Signed)
PROGRESS NOTE  IRA DOUGHER  WUJ:[id number] DOB: May 09, 1961 DOA: 08/21/2018 PCP: Patient, No Pcp Per   Brief Narrative: PERNELL LENOIR is a 57 y.o.malewith medical history ofrecent stroke September 2019,hypertension and polysubstance abuse presenting with2 days of coughing, congestion, subjective fevers and chills and shortness of breath. The patient was recently discharged from the hospital after a stay from 07/12/2018 through 07/15/2018 after treatment for an acute ischemic stroke. He was discharged to [facility]. He subsequently returned home approximately 2 weeks prior to this admission. He denies any recurrent substance abuse or tobacco.Upon presentation, the patient was noted to have a temperature 100.2 F with tachycardia. He was stable on room air saturating 98%. Chest x-ray showed left basilar opacity. Patient was started on vancomycin, cefepime, and metronidazole for healthcare associated pneumonia and improved. Due to dysphagia, esophagram performed showing mucosal thickening in the mid-distal esophagus with dysmotility and focal narrowing >5cm in length. GI was consulted and performed EGD 11/13 showing severe erosions.   Assessment & Plan: Principal Problem:   HCAP (healthcare-associated pneumonia) Active Problems:   Essential hypertension   Dyslipidemia   Elevated transaminase level   Lobar pneumonia (HCC)   Sepsis due to undetermined organism ([place])   Fever   Precordial pain   Gastroesophageal reflux disease with esophagitis  Sepsis due to HCAP: POA.  - Converted abx to augmentin to complete 1 week Tx.  - Monitor final blood culture results.   Severe esophagitis, gastric erosions, extensive duodenal ulceration with satellite erosions:  - PPI BID, carafate 1g QID x5 days - Follow up biopsy results  Transaminase elevation: UDS neg, RUQ U/S neg, Hep Ab's neg. Improving.  - Discontinue lipitor temporarily  Atypical chest pain: Due to coughing with pneumonia and  probably related to esophagitis as well. Tn's neg, no definite ST changes, CTA chest negative for PE.  - Monitor  History of polysubstance abuse: Including cocaine and tobacco. Positive cocaine in sept 2019, UDS here negative.   Dyslipidemia - Holding atorvastatin until LFTs have improved - Follow up with PCP for restart atorvastatin  SVT/atrial tachycardia - Presently in sinus rhythm - Continue diltiazem  Essential hypertension - Continue diltiazem  Recent Stroke - Continue ASA  Hypokalemia: Resolved with replacement.  DVT prophylaxis: Heparin Code Status: Full Family Communication: None at bedside Disposition Plan: Home when cleared by GI  Consultants:   GI  Procedures:   EGD 08/25/2018 by Dr. [name]:  -Severely inflamed abnormal appearing mid/distal esophagus. Encroachment on lumen suspicious for infiltrating neoplasm but could all be severe benign inflammation biopsied. Extensive gastric erosions status post biopsy. Extensive duodenal ulceration with satellite erosions status post biopsy. Query ischemic process versus other. May be multifactorial.  Recommendation: Return patient to hospital ward for ongoing care.                           - Chopped diet.                           - Continue present medications. Continue twice                            daily PPI. Add Carafate suspension 1 g 4 times                            daily x5 days.                           -  Await pathology results.                           - Repeat upper endoscopy for surveillance based on                            pathology results.  Antimicrobials: Amox/clav 11/13>>>11/15 Cefepime 11/9>>>11/13 vanco 11/9>>11/11 Flagyl>>11/9>>11/11  Subjective: Dysphagia continues, though is hungry with being NPO this morning. No chest pain, dyspnea. No bleeding noted.  Objective: Vitals:   08/25/18 1415 08/25/18 1430 08/25/18 1444 08/25/18 1512  BP: 123/86 123/85 128/83 132/77  Pulse: 78  68 68 64  Resp: 15 (!) 23 18 17   Temp:   98.3 F (36.8 C) 98.4 F (36.9 C)  TempSrc:   Oral Oral  SpO2: 94% 97% 97% 100%  Weight:      Height:        Intake/Output Summary (Last 24 hours) at 08/25/2018 1616 Last data filed at 08/25/2018 1510 Gross per 24 hour  Intake 448 ml  Output 0 ml  Net 448 ml   Filed Weights   08/21/18 2100  Weight: 59.4 kg    Gen: 57 y.o. male in no distress  Pulm: Non-labored breathing. Clear to auscultation bilaterally.  CV: Regular rate and rhythm. No murmur, rub, or gallop. No JVD, no pedal edema. GI: Abdomen soft, non-tender, non-distended, with normoactive bowel sounds. No organomegaly or masses felt. Ext: Warm, no deformities Skin: No rashes, lesions no ulcers Neuro: Alert and oriented. No focal neurological deficits. Psych: Judgement and insight appear normal. Mood & affect appropriate.   Data Reviewed: I have personally reviewed following labs and imaging studies  CBC: Recent Labs  Lab 08/21/18 2143 08/22/18 0635 08/23/18 0204 08/25/18 0507  WBC 14.4* 14.3* 5.9 4.8  NEUTROABS 12.9* 12.4* 4.0  --   HGB 13.3 11.9* 11.2* 10.3*  HCT 43.1 39.3 36.9* 33.3*  MCV 84.7 86.0 86.0 86.5  PLT 317 284 261 518   Basic Metabolic Panel: Recent Labs  Lab 08/21/18 2143 08/22/18 0635 08/23/18 0204 08/24/18 0514 08/25/18 0507  NA 136 137 135 138 138  K 3.7 3.8 3.3* 3.4* 3.6  CL 101 104 106 113* 107  CO2 25 25 22  20* 25  GLUCOSE 125* 99 95 88 88  BUN 12 13 14 10 10   CREATININE 0.78 0.68 0.72 0.68 0.67  CALCIUM 8.7* 8.4* 8.0* 8.2* 7.9*  MG  --   --   --  2.0  --    GFR: Estimated Creatinine Clearance: 84.6 mL/min (by C-G formula based on SCr of 0.67 mg/dL). Liver Function Tests: Recent Labs  Lab 08/21/18 2143 08/22/18 0635 08/23/18 0204 08/24/18 0514  AST 207* 124* 128* 90*  ALT 666* 516* 400* 304*  ALKPHOS 95 80 71 66  BILITOT 0.6 0.5 0.9 0.3  PROT 7.3 6.3* 6.1* 5.8*  ALBUMIN 3.7 3.1* 3.0* 2.8*   No results for input(s):  LIPASE, AMYLASE in the last 168 hours. No results for input(s): AMMONIA in the last 168 hours. Coagulation Profile: No results for input(s): INR, PROTIME in the last 168 hours. Cardiac Enzymes: Recent Labs  Lab 08/22/18 1444 08/22/18 2027 08/23/18 0203  TROPONINI <0.03 <0.03 <0.03   BNP (last 3 results) No results for input(s): PROBNP in the last 8760 hours. HbA1C: No results for input(s): HGBA1C in the last 72 hours. CBG: No results for input(s): GLUCAP in the last 168 hours. Lipid  Profile: No results for input(s): CHOL, HDL, LDLCALC, TRIG, CHOLHDL, LDLDIRECT in the last 72 hours. Thyroid Function Tests: No results for input(s): TSH, T4TOTAL, FREET4, T3FREE, THYROIDAB in the last 72 hours. Anemia Panel: No results for input(s): VITAMINB12, FOLATE, FERRITIN, TIBC, IRON, RETICCTPCT in the last 72 hours. Urine analysis:    Component Value Date/Time   COLORURINE YELLOW 08/22/2018 1500   APPEARANCEUR CLEAR 08/22/2018 1500   LABSPEC 1.018 08/22/2018 1500   PHURINE 7.0 08/22/2018 1500   GLUCOSEU NEGATIVE 08/22/2018 1500   HGBUR SMALL (A) 08/22/2018 1500   BILIRUBINUR NEGATIVE 08/22/2018 1500   KETONESUR NEGATIVE 08/22/2018 1500   PROTEINUR NEGATIVE 08/22/2018 1500   UROBILINOGEN 0.2 03/03/2012 0205   NITRITE NEGATIVE 08/22/2018 1500   LEUKOCYTESUR NEGATIVE 08/22/2018 1500   Recent Results (from the past 240 hour(s))  Blood Culture (routine x 2)     Status: None (Preliminary result)   Collection Time: 08/21/18  9:44 PM  Result Value Ref Range Status   Specimen Description BLOOD RIGHT ARM  Final   Special Requests   Final    BOTTLES DRAWN AEROBIC ONLY Blood Culture results may not be optimal due to an inadequate volume of blood received in culture bottles   Culture   Final    NO GROWTH 4 DAYS Performed at [facility], [address] [place], [place] [address]    Report Status PENDING  Incomplete  Blood Culture (routine x 2)     Status: None (Preliminary result)    Collection Time: 08/21/18 10:00 PM  Result Value Ref Range Status   Specimen Description BLOOD LEFT HAND  Final   Special Requests   Final    BOTTLES DRAWN AEROBIC AND ANAEROBIC Blood Culture results may not be optimal due to an excessive volume of blood received in culture bottles   Culture   Final    NO GROWTH 4 DAYS Performed at [facility], [address], [place], [place] [address]    Report Status PENDING  Incomplete  Culture, Urine     Status: None   Collection Time: 08/22/18  3:00 PM  Result Value Ref Range Status   Specimen Description   Final    URINE, CLEAN CATCH Performed at [facility], [address] [place], [place] [address]    Special Requests   Final    NONE Performed at [facility], [address], [place], [place] [address]    Culture   Final    NO GROWTH Performed at [facility], [place] [address], [place], [place] [address]    Report Status 08/23/2018 FINAL  Final      Radiology Studies: Dg Esophagus  Result Date: 08/24/2018 CLINICAL DATA:  Abnormal CT exam with suspected esophageal wall thickening versus adenopathy EXAM: ESOPHOGRAM/BARIUM SWALLOW TECHNIQUE: Single contrast examination was performed using thin barium. Patient also swallowed a 12.5 mm diameter barium tablet. FLUOROSCOPY TIME:  Fluoroscopy Time:  3 minutes 42 seconds Radiation Exposure Index (if provided by the fluoroscopic device): 58.1 mGy Number of Acquired Spot Images: multiple fluoroscopic screen captures COMPARISON:  CT chest 08/23/2018 FINDINGS: Examination performed with head of bed elevated 30 degrees; patient unable to stand. Esophageal distention: Area of relative narrowing of the mid to distal thoracic esophagus. Remainder of esophagus appears normal in caliber. Filling defects:  Wall thickening, see below 12.5 mm barium tablet: Passed from oral cavity to the distal esophagus. Tablet was unable to pass into the stomach but this appeared to be related to impaired motility and  lack  of upright positioning rather than due to stricture. Motility:  Diffuse age-related esophageal dysmotility Mucosa: Diffuse mucosal thickening of the mid to distal esophagus. An area of relative luminal narrowing of the esophagus is identified at the mid to distal thoracic portion with irregular walls, could represent severe soft a gyrus or a mass. Additional wall thickening is seen more distally extending to the gastroesophageal junction region. Hypopharynx/cervical esophagus: Laryngeal penetration to the level of the vocal cords without gross aspiration Hiatal hernia:  Absent GE reflux:  Not witnessed during exam Other:  N/A IMPRESSION: Mucosal thickening of the mid to distal thoracic esophagus consistent with esophagitis. Focal area of relative narrowing of the esophagus is identified estimated over 5 cm length which could represent an area of more severe mucosal edema/esophagitis or an esophageal neoplasm; endoscopic assessment recommended to exclude tumor. Diffusely impaired esophageal motility. Laryngeal penetration without gross aspiration. Electronically Signed   By: Lavonia Dana M.D.   On: 08/24/2018 10:39    Scheduled Meds: . amoxicillin-clavulanate  1 tablet Oral Q12H  . aspirin  325 mg Oral Daily  . diltiazem  120 mg Oral Daily  . pantoprazole  40 mg Oral BID AC  . sucralfate  1 g Oral TID WC & HS   Continuous Infusions:   LOS: 4 days   Time spent: 25 minutes.  Patrecia Pour, MD [facility] [email or web address] Password [email or web address] 08/25/2018, 4:16 PM

## 2018-08-26 DIAGNOSIS — K2970 Gastritis, unspecified, without bleeding: Secondary | ICD-10-CM

## 2018-08-26 DIAGNOSIS — A419 Sepsis, unspecified organism: Principal | ICD-10-CM

## 2018-08-26 DIAGNOSIS — I10 Essential (primary) hypertension: Secondary | ICD-10-CM

## 2018-08-26 DIAGNOSIS — J181 Lobar pneumonia, unspecified organism: Secondary | ICD-10-CM

## 2018-08-26 DIAGNOSIS — K229 Disease of esophagus, unspecified: Secondary | ICD-10-CM

## 2018-08-26 DIAGNOSIS — K2990 Gastroduodenitis, unspecified, without bleeding: Secondary | ICD-10-CM

## 2018-08-26 DIAGNOSIS — E785 Hyperlipidemia, unspecified: Secondary | ICD-10-CM

## 2018-08-26 LAB — COMPREHENSIVE METABOLIC PANEL
ALBUMIN: 2.7 g/dL — AB (ref 3.5–5.0)
ALT: 200 U/L — AB (ref 0–44)
ANION GAP: 6 (ref 5–15)
AST: 56 U/L — AB (ref 15–41)
Alkaline Phosphatase: 61 U/L (ref 38–126)
BILIRUBIN TOTAL: 0.5 mg/dL (ref 0.3–1.2)
BUN: 8 mg/dL (ref 6–20)
CO2: 26 mmol/L (ref 22–32)
Calcium: 8.3 mg/dL — ABNORMAL LOW (ref 8.9–10.3)
Chloride: 103 mmol/L (ref 98–111)
Creatinine, Ser: 0.62 mg/dL (ref 0.61–1.24)
GFR calc Af Amer: >60 mL/min (ref >60)
GFR calc non Af Amer: >60 mL/min (ref >60)
GLUCOSE: 86 mg/dL (ref 70–99)
POTASSIUM: 3.3 mmol/L — AB (ref 3.5–5.1)
Sodium: 135 mmol/L (ref 135–145)
TOTAL PROTEIN: 5.4 g/dL — AB (ref 6.5–8.1)

## 2018-08-26 LAB — CULTURE, BLOOD (ROUTINE X 2)
Culture: NO GROWTH
Culture: NO GROWTH

## 2018-08-26 LAB — CBC
HCT: 33.1 % — ABNORMAL LOW (ref 39.0–52.0)
HEMOGLOBIN: 10.2 g/dL — AB (ref 13.0–17.0)
MCH: 26.4 pg (ref 26.0–34.0)
MCHC: 30.8 g/dL (ref 30.0–36.0)
MCV: 85.5 fL (ref 80.0–100.0)
NRBC: 0 % (ref 0.0–0.2)
Platelets: 258 10*3/uL (ref 150–400)
RBC: 3.87 MIL/uL — AB (ref 4.22–5.81)
RDW: 13.3 % (ref 11.5–15.5)
WBC: 2.9 10*3/uL — ABNORMAL LOW (ref 4.0–10.5)

## 2018-08-26 MED ORDER — SUCRALFATE 1 GM/10ML PO SUSP: 1.0000 g | Freq: Three times a day (TID) | ORAL | 0 refills | Status: DC

## 2018-08-26 MED ORDER — PANTOPRAZOLE SODIUM 40 MG PO TBEC: 40.0000 mg | DELAYED_RELEASE_TABLET | Freq: Two times a day (BID) | ORAL | 0 refills | Status: DC

## 2018-08-26 MED ORDER — AMOXICILLIN-POT CLAVULANATE 875-125 MG PO TABS: 1.0000 | ORAL_TABLET | Freq: Two times a day (BID) | ORAL | 0 refills | Status: DC

## 2018-08-26 NOTE — Anesthesia Postprocedure Evaluation (Signed)
Anesthesia Post Note  Patient: Evan Chavez  Procedure(s) Performed: ESOPHAGOGASTRODUODENOSCOPY (EGD) WITH PROPOFOL (N/A ) BIOPSY  Patient location during evaluation: Nursing Unit Anesthesia Type: MAC Level of consciousness: awake and patient cooperative Pain management: pain level controlled Vital Signs Assessment: post-procedure vital signs reviewed and stable Respiratory status: spontaneous breathing, nonlabored ventilation and respiratory function stable Cardiovascular status: blood pressure returned to baseline Postop Assessment: adequate PO intake and no apparent nausea or vomiting Anesthetic complications: no     Last Vitals:  Vitals:   08/25/18 2053 08/26/18 0513  BP: 119/76 129/84  Pulse: (!) 57 (!) 55  Resp: 18 18  Temp: 36.9 C 36.7 C  SpO2: 99% 100%    Last Pain:  Vitals:   08/26/18 0923  TempSrc:   PainSc: 0-No pain                 [name] J

## 2018-08-26 NOTE — Care Management Note (Signed)
Case Management Note  Patient Details  Name: Evan Chavez MRN: [id number] Date of Birth: 1961/05/10  Subjective/Objective:                    Action/Plan: New appt scheduled for [facility].  [facility] voucher given and explained. Patient uses [employer].   Expected Discharge Date:     unk           Expected Discharge Plan:  Home/Self Care  In-House Referral:     Discharge planning Services  CM Consult, Follow-up appt scheduled, [facility]  Post Acute Care Choice:    Choice offered to:     DME Arranged:    DME Agency:     HH Arranged:    HH Agency:     Status of Service:  Completed, signed off  If discussed at [employer] of Stay Meetings, dates discussed:    Additional Comments:  Ameena Vesey, Chauncey Reading, RN 08/26/2018, 12:24 PM

## 2018-08-26 NOTE — Progress Notes (Signed)
[facility] discharged Home per MD order.  Discharge instructions reviewed and discussed with the patient, all questions and concerns answered. Copy of instructions and scripts given to patient.  Allergies as of 08/26/2018   No Known Allergies     Medication List    TAKE these medications   amoxicillin-clavulanate 875-125 MG tablet Commonly known as:  AUGMENTIN Take 1 tablet by mouth every 12 (twelve) hours.   ANTACID ADVANCED PO Take 1 tablet by mouth daily.   aspirin 325 MG tablet Take 1 tablet (325 mg total) by mouth daily.   atorvastatin 10 MG tablet Commonly known as:  LIPITOR Take 1 tablet (10 mg total) by mouth daily at 6 PM. Do NOT restart until followed up with PCP What changed:  additional instructions   diltiazem 120 MG 24 hr capsule Commonly known as:  CARDIZEM CD Take 1 capsule (120 mg total) by mouth daily.   multivitamin with minerals Tabs tablet Take 1 tablet by mouth daily.   pantoprazole 40 MG tablet Commonly known as:  PROTONIX Take 1 tablet (40 mg total) by mouth 2 (two) times daily before a meal.   sucralfate 1 GM/10ML suspension Commonly known as:  CARAFATE Take 10 mLs (1 g total) by mouth 4 (four) times daily -  with meals and at bedtime for 4 days.       Patients skin is clean, dry and intact, no evidence of skin break down. IV site discontinued and catheter remains intact. Site without signs and symptoms of complications. Dressing and pressure applied.  Patient escorted to car by NT in a wheelchair,  no distress noted upon discharge.  Ralene Muskrat Tripp Goins 08/26/2018 7:57 PM

## 2018-08-26 NOTE — Progress Notes (Signed)
Subjective:  Patient without complaints  Objective: Vital signs in last 24 hours: Temp:  [97.4 F (36.3 C)-98.4 F (36.9 C)] 98.1 F (36.7 C) (11/14 0513) Pulse Rate:  [55-78] 55 (11/14 0513) Resp:  [15-23] 18 (11/14 0513) BP: (119-132)/(76-86) 129/84 (11/14 0513) SpO2:  [93 %-100 %] 100 % (11/14 0513) Last BM Date: 08/24/18 General:   Alert,  Well-developed, well-nourished, pleasant and cooperative in NAD Head:  Normocephalic and atraumatic. Eyes:  Sclera clear, no icterus.  Abd: soft, nt  Psych:  Alert and cooperative. Normal mood and affect.  Intake/Output from previous day: 11/13 0701 - 11/14 0700 In: 688 [P.O.:240; I.V.:448] Out: 450 [Urine:450] Intake/Output this shift: No intake/output data recorded.  Lab Results: CBC Recent Labs    08/25/18 0507 08/26/18 0518  WBC 4.8 2.9*  HGB 10.3* 10.2*  HCT 33.3* 33.1*  MCV 86.5 85.5  PLT 265 258   BMET Recent Labs    08/24/18 0514 08/25/18 0507 08/26/18 0518  NA 138 138 135  K 3.4* 3.6 3.3*  CL 113* 107 103  CO2 20* 25 26  GLUCOSE 88 88 86  BUN 10 10 8   CREATININE 0.68 0.67 0.62  CALCIUM 8.2* 7.9* 8.3*   LFTs Recent Labs    08/24/18 0514 08/26/18 0518  BILITOT 0.3 0.5  ALKPHOS 66 61  AST 90* 56*  ALT 304* 200*  PROT 5.8* 5.4*  ALBUMIN 2.8* 2.7*   No results for input(s): LIPASE in the last 72 hours. PT/INR No results for input(s): LABPROT, INR in the last 72 hours.    Imaging Studies: Dg Chest 2 View  Result Date: 08/21/2018 CLINICAL DATA:  Chest pain, cough and fever. History of stroke in September. EXAM: CHEST - 2 VIEW COMPARISON:  Chest x-ray dated 07/12/2018. FINDINGS: New ill-defined opacities at the LEFT lung base, suspicious for early developing pneumonia. Heart size and mediastinal contours are stable. No pleural effusions seen. No acute or suspicious osseous finding. IMPRESSION: Probable LEFT lower lobe pneumonia. Electronically Signed   By: Franki Cabot M.D.   On: 08/21/2018 23:20   Ct  Angio Chest Pe W Or Wo Contrast  Result Date: 08/23/2018 CLINICAL DATA:  Chest pain, shortness of breath, elevated D-dimer EXAM: CT ANGIOGRAPHY CHEST WITH CONTRAST TECHNIQUE: Multidetector CT imaging of the chest was performed using the standard protocol during bolus administration of intravenous contrast. Multiplanar CT image reconstructions and MIPs were obtained to evaluate the vascular anatomy. CONTRAST:  115mL ISOVUE-370 IOPAMIDOL (ISOVUE-370) INJECTION 76% COMPARISON:  Chest CT 08/21/2018 FINDINGS: Cardiovascular: No filling defects in the pulmonary arteries to suggest pulmonary emboli. Heart is borderline in size. Scattered coronary artery and aortic calcifications. No evidence of aortic aneurysm. Mediastinum/Nodes: Abnormal appearing soft tissue noted in the posterior mediastinum in the region of the mid and distal esophagus below the carina to the hiatus. It is difficult to evaluate the esophagus due to this amorphous appearance. Mildly prominent bilateral hilar lymph nodes, likely reactive. The largest is a right hilar lymph node measuring 11 mm on image 49. Lungs/Pleura: Layering mucus noted within the distal trachea and proximal right mainstem bronchus. Airspace opacities are present in both lower lobes, left greater than right concerning for pneumonia. No effusions. Upper Abdomen: Imaging into the upper abdomen shows no acute findings. Musculoskeletal: Chest wall soft tissues are unremarkable. No acute bony abnormality. Review of the MIP images confirms the above findings. IMPRESSION: Airspace opacities in both lower lobes, left greater than right concerning for pneumonia. No evidence of pulmonary embolus. Layering  mucus/debris in the distal trachea and proximal right mainstem bronchus. Abnormal soft tissue fullness in the posterior mediastinum in the region of the mid to distal esophagus. It is difficult to evaluate the esophagus due to this amorphous appearance. This could be related to an  esophageal abnormality or possibly abnormal lymph nodes (felt less likely). Consider further evaluation with elective standard CT chest with contrast (not CTA) with oral contrast (this should be done on a separate day from today's test due to the presence of intravenous contrast now in the system), or elective esophagram. Coronary artery disease. Electronically Signed   By: Rolm Baptise M.D.   On: 08/23/2018 11:01   Dg Esophagus  Result Date: 08/24/2018 CLINICAL DATA:  Abnormal CT exam with suspected esophageal wall thickening versus adenopathy EXAM: ESOPHOGRAM/BARIUM SWALLOW TECHNIQUE: Single contrast examination was performed using thin barium. Patient also swallowed a 12.5 mm diameter barium tablet. FLUOROSCOPY TIME:  Fluoroscopy Time:  3 minutes 42 seconds Radiation Exposure Index (if provided by the fluoroscopic device): 58.1 mGy Number of Acquired Spot Images: multiple fluoroscopic screen captures COMPARISON:  CT chest 08/23/2018 FINDINGS: Examination performed with head of bed elevated 30 degrees; patient unable to stand. Esophageal distention: Area of relative narrowing of the mid to distal thoracic esophagus. Remainder of esophagus appears normal in caliber. Filling defects:  Wall thickening, see below 12.5 mm barium tablet: Passed from oral cavity to the distal esophagus. Tablet was unable to pass into the stomach but this appeared to be related to impaired motility and lack of upright positioning rather than due to stricture. Motility:  Diffuse age-related esophageal dysmotility Mucosa: Diffuse mucosal thickening of the mid to distal esophagus. An area of relative luminal narrowing of the esophagus is identified at the mid to distal thoracic portion with irregular walls, could represent severe soft a gyrus or a mass. Additional wall thickening is seen more distally extending to the gastroesophageal junction region. Hypopharynx/cervical esophagus: Laryngeal penetration to the level of the vocal cords  without gross aspiration Hiatal hernia:  Absent GE reflux:  Not witnessed during exam Other:  N/A IMPRESSION: Mucosal thickening of the mid to distal thoracic esophagus consistent with esophagitis. Focal area of relative narrowing of the esophagus is identified estimated over 5 cm length which could represent an area of more severe mucosal edema/esophagitis or an esophageal neoplasm; endoscopic assessment recommended to exclude tumor. Diffusely impaired esophageal motility. Laryngeal penetration without gross aspiration. Electronically Signed   By: Lavonia Dana M.D.   On: 08/24/2018 10:39   US Abdomen Limited Ruq  Result Date: 08/22/2018 CLINICAL DATA:  Elevated transaminase. EXAM: ULTRASOUND ABDOMEN LIMITED RIGHT UPPER QUADRANT COMPARISON:  None. FINDINGS: Gallbladder: No gallstones or wall thickening visualized. No sonographic Murphy sign noted by sonographer. Common bile duct: Diameter: 3 mm.  Normal. Liver: No focal lesion identified. Within normal limits in parenchymal echogenicity. Portal vein is patent on color Doppler imaging with normal direction of blood flow towards the liver. IMPRESSION: Normal right upper quadrant ultrasound. Electronically Signed   By: Nelson Chimes M.D.   On: 08/22/2018 11:27  [2 weeks]   Assessment:  57 year old male with history of drug and alcohol abuse, prior ischemic stroke September 30 status post hospitalization and rehabilitation, presenting with sepsis secondary to pneumonia.  Abnormal appearing esophagus on CTA and BPE.  Longstanding history of GERD.  EGD yesterday showed severely inflamed abnormal appearing mid/distal esophagus.  Encroachment on lumen suspicious for infiltrating neoplasm but could all be severe benign inflammation.  Extensive gastric erosions.  Extensive duodenal ulceration with satellite erosions.  Query ischemic process versus other.  May be multifactorial.  New onset elevated transaminases, initially AST 207/ALT 666.  Today AST 56/ALT 200.   HCVRNA negative.  Acute hepatitis panel negative.  Right upper quadrant ultrasound unremarkable.  Possibly related to sepsis.  Plan: 1. Follow-up pending biopsies 2. Chopped diet 3. Continue twice daily PPI. 4. 5-day course of Carafate suspension 1 g 4 times daily 5. Repeat upper endoscopy for surveillance based on pathology results 6. Continue to follow LFTs to normal.  Laureen Ochs. Bernarda Caffey [facility] [phone] 11/14/201912:35 PM     LOS: 5 days

## 2018-08-26 NOTE — Addendum Note (Signed)
Addendum  created 08/26/18 1450 by Charmaine Downs, CRNA   Sign clinical note

## 2018-08-26 NOTE — Discharge Summary (Signed)
Physician Discharge Summary  Evan Chavez UXN:[id number] DOB: 10/18/1960 DOA: 08/21/2018  PCP: Patient, No Pcp Per  Admit date: 08/21/2018 Discharge date: 08/26/2018  Admitted From: Home Disposition: Home   Recommendations for Outpatient Follow-up:  1. Follow up with PCP in 1-2 weeks 2. Follow up CBC, CMP. Holding statin pending normalization of LFTs. 3. Follow up biopsy results from EGD, will follow up with [facility].  Home Health: None Equipment/Devices: None Discharge Condition: Stable CODE STATUS: Full Diet recommendation: Heart healthy chopped diet as tolerated  Brief/Interim Summary: Evan Chavez is a 57 y.o.malewith medical history ofrecent stroke September 2019,hypertension and polysubstance abuse presenting with2 days of coughing, congestion, subjective fevers and chills and shortness of breath. The patient was recently discharged from the hospital after a stay from 07/12/2018 through 07/15/2018 after treatment for an acute ischemic stroke. He was discharged to [facility]. He subsequently returned home approximately 2 weeks prior to this admission. He denies any recurrent substance abuse or tobacco.Upon presentation, the patient was noted to have a temperature 100.2 F with tachycardia. He was stable on room air saturating 98%. Chest x-ray showed left basilar opacity. Patient was started on vancomycin, cefepime, and metronidazole for healthcare associated pneumonia and improved. Due to dysphagia, esophagram performed showing mucosal thickening in the mid-distal esophagus with dysmotility and focal narrowing >5cm in length. GI was consulted and performed EGD 11/13 showing severe erosions. PPI and carafate were recommended, patient placed on chopped diet with good tolerance. From a respiratory standpoint, he has improved significantly and GI has cleared him for discharge as well.   Discharge Diagnoses:  Principal Problem:   HCAP (healthcare-associated pneumonia) Active  Problems:   Essential hypertension   Dyslipidemia   Elevated transaminase level   Lobar pneumonia (HCC)   Sepsis due to undetermined organism (HCC)   Fever   Precordial pain   Gastroesophageal reflux disease with esophagitis   Abnormality of esophagus   Gastritis and gastroduodenitis  Sepsis due to HCAP: POA. Blood cultures negative. - Converted abx to augmentin to complete 1 week Tx. Last dose will be 10/15 PM.  Severe esophagitis, gastric erosions, extensive duodenal ulceration with satellite erosions:  - PPI BID, carafate 1g QID x5 days - Follow up biopsy results per GI  Transaminase elevation: UDS neg, RUQ U/S neg, Hep Ab's neg. Improving.  - Discontinue lipitor temporarily  Atypical chest pain: Due to coughing with pneumonia and probably related to esophagitis as well. Tn's neg, no definite ST changes, CTA chest negative for PE.  - Monitor  History of polysubstance abuse: Including cocaine and tobacco. Positive cocaine in sept 2019, UDS here negative.   Dyslipidemia - Holding atorvastatin until LFTs have improved - Follow up with PCP for restart atorvastatin  SVT/atrial tachycardia - Presently in sinus rhythm - Continue diltiazem  Essential hypertension - Continue diltiazem  Recent Stroke - Continue ASA  Hypokalemia: Resolved with replacement.  Discharge Instructions Discharge Instructions    Diet - low sodium heart healthy   Complete by:  As directed    Discharge instructions   Complete by:  As directed    You were admitted for sepsis due to pneumonia and had a work up for chest pain, found to have severe esophagitis, gastritis and duodenitis (inflammation of the upper GI tract). The treatment for pneumonia is to continue augmentin for 1 more day (the last dose will be tomorrow evening). the treatment for the GI tract is carafate 4 times daily for the next 4 days and to  continue protonix twice daily until you follow up with GI. They will contact you  regarding your biopsy results.   you will need to follow up with your PCP in the next 1-2 weeks and get repeat labs to follow the elevations in your liver tests which are improving. Until that time, do not take lipitor.   If your symptoms return or worsen, seek medical attention right away.   Increase activity slowly   Complete by:  As directed      Allergies as of 08/26/2018   No Known Allergies     Medication List    TAKE these medications   amoxicillin-clavulanate 875-125 MG tablet Commonly known as:  AUGMENTIN Take 1 tablet by mouth every 12 (twelve) hours.   ANTACID ADVANCED PO Take 1 tablet by mouth daily.   aspirin 325 MG tablet Take 1 tablet (325 mg total) by mouth daily.   atorvastatin 10 MG tablet Commonly known as:  LIPITOR Take 1 tablet (10 mg total) by mouth daily at 6 PM. Do NOT restart until followed up with PCP What changed:  additional instructions   diltiazem 120 MG 24 hr capsule Commonly known as:  CARDIZEM CD Take 1 capsule (120 mg total) by mouth daily.   multivitamin with minerals Tabs tablet Take 1 tablet by mouth daily.   pantoprazole 40 MG tablet Commonly known as:  PROTONIX Take 1 tablet (40 mg total) by mouth 2 (two) times daily before a meal.   sucralfate 1 GM/10ML suspension Commonly known as:  CARAFATE Take 10 mLs (1 g total) by mouth 4 (four) times daily -  with meals and at bedtime for 4 days.      Follow-up Information    [facility] Follow up.   Why:  appt 08/30/2018 11 am  Contact information: [address] [place] [address] Franki Cabot [phone]       [place]. Call.   Contact information: [address] [place] [phone]         No Known Allergies  Consultations:  GI  Procedures/Studies: Dg Chest 2 View  Result Date: 08/21/2018 CLINICAL DATA:  Chest pain, cough and fever. History of stroke in September. EXAM: CHEST - 2 VIEW  COMPARISON:  Chest x-ray dated 07/12/2018. FINDINGS: New ill-defined opacities at the LEFT lung base, suspicious for early developing pneumonia. Heart size and mediastinal contours are stable. No pleural effusions seen. No acute or suspicious osseous finding. IMPRESSION: Probable LEFT lower lobe pneumonia. Electronically Signed   By: Franki Cabot M.D.   On: 08/21/2018 23:20   Ct Angio Chest Pe W Or Wo Contrast  Result Date: 08/23/2018 CLINICAL DATA:  Chest pain, shortness of breath, elevated D-dimer EXAM: CT ANGIOGRAPHY CHEST WITH CONTRAST TECHNIQUE: Multidetector CT imaging of the chest was performed using the standard protocol during bolus administration of intravenous contrast. Multiplanar CT image reconstructions and MIPs were obtained to evaluate the vascular anatomy. CONTRAST:  161mL ISOVUE-370 IOPAMIDOL (ISOVUE-370) INJECTION 76% COMPARISON:  Chest CT 08/21/2018 FINDINGS: Cardiovascular: No filling defects in the pulmonary arteries to suggest pulmonary emboli. Heart is borderline in size. Scattered coronary artery and aortic calcifications. No evidence of aortic aneurysm. Mediastinum/Nodes: Abnormal appearing soft tissue noted in the posterior mediastinum in the region of the mid and distal esophagus below the carina to the hiatus. It is difficult to evaluate the esophagus due to this amorphous appearance. Mildly prominent bilateral hilar lymph nodes, likely reactive. The largest is a right hilar lymph node  measuring 11 mm on image 49. Lungs/Pleura: Layering mucus noted within the distal trachea and proximal right mainstem bronchus. Airspace opacities are present in both lower lobes, left greater than right concerning for pneumonia. No effusions. Upper Abdomen: Imaging into the upper abdomen shows no acute findings. Musculoskeletal: Chest wall soft tissues are unremarkable. No acute bony abnormality. Review of the MIP images confirms the above findings. IMPRESSION: Airspace opacities in both lower  lobes, left greater than right concerning for pneumonia. No evidence of pulmonary embolus. Layering mucus/debris in the distal trachea and proximal right mainstem bronchus. Abnormal soft tissue fullness in the posterior mediastinum in the region of the mid to distal esophagus. It is difficult to evaluate the esophagus due to this amorphous appearance. This could be related to an esophageal abnormality or possibly abnormal lymph nodes (felt less likely). Consider further evaluation with elective standard CT chest with contrast (not CTA) with oral contrast (this should be done on a separate day from today's test due to the presence of intravenous contrast now in the system), or elective esophagram. Coronary artery disease. Electronically Signed   By: Rolm Baptise M.D.   On: 08/23/2018 11:01   Dg Esophagus  Result Date: 08/24/2018 CLINICAL DATA:  Abnormal CT exam with suspected esophageal wall thickening versus adenopathy EXAM: ESOPHOGRAM/BARIUM SWALLOW TECHNIQUE: Single contrast examination was performed using thin barium. Patient also swallowed a 12.5 mm diameter barium tablet. FLUOROSCOPY TIME:  Fluoroscopy Time:  3 minutes 42 seconds Radiation Exposure Index (if provided by the fluoroscopic device): 58.1 mGy Number of Acquired Spot Images: multiple fluoroscopic screen captures COMPARISON:  CT chest 08/23/2018 FINDINGS: Examination performed with head of bed elevated 30 degrees; patient unable to stand. Esophageal distention: Area of relative narrowing of the mid to distal thoracic esophagus. Remainder of esophagus appears normal in caliber. Filling defects:  Wall thickening, see below 12.5 mm barium tablet: Passed from oral cavity to the distal esophagus. Tablet was unable to pass into the stomach but this appeared to be related to impaired motility and lack of upright positioning rather than due to stricture. Motility:  Diffuse age-related esophageal dysmotility Mucosa: Diffuse mucosal thickening of the mid  to distal esophagus. An area of relative luminal narrowing of the esophagus is identified at the mid to distal thoracic portion with irregular walls, could represent severe soft a gyrus or a mass. Additional wall thickening is seen more distally extending to the gastroesophageal junction region. Hypopharynx/cervical esophagus: Laryngeal penetration to the level of the vocal cords without gross aspiration Hiatal hernia:  Absent GE reflux:  Not witnessed during exam Other:  N/A IMPRESSION: Mucosal thickening of the mid to distal thoracic esophagus consistent with esophagitis. Focal area of relative narrowing of the esophagus is identified estimated over 5 cm length which could represent an area of more severe mucosal edema/esophagitis or an esophageal neoplasm; endoscopic assessment recommended to exclude tumor. Diffusely impaired esophageal motility. Laryngeal penetration without gross aspiration. Electronically Signed   By: Lavonia Dana M.D.   On: 08/24/2018 10:39   US Abdomen Limited Ruq  Result Date: 08/22/2018 CLINICAL DATA:  Elevated transaminase. EXAM: ULTRASOUND ABDOMEN LIMITED RIGHT UPPER QUADRANT COMPARISON:  None. FINDINGS: Gallbladder: No gallstones or wall thickening visualized. No sonographic Murphy sign noted by sonographer. Common bile duct: Diameter: 3 mm.  Normal. Liver: No focal lesion identified. Within normal limits in parenchymal echogenicity. Portal vein is patent on color Doppler imaging with normal direction of blood flow towards the liver. IMPRESSION: Normal right upper quadrant ultrasound. Electronically Signed  By: Nelson Chimes M.D.   On: 08/22/2018 11:27     EGD 08/25/2018 by Dr. [name]: -Severely inflamed abnormal appearing mid/distal esophagus. Encroachment on lumen suspicious for infiltrating neoplasm but could all be severe benign inflammation biopsied. Extensive gastric erosions status post biopsy. Extensive duodenalulceration with satellite erosions status postbiopsy.  Query ischemic process versus other. May be multifactorial.  Recommendation: Return patient to hospital ward for ongoing care. - Chopped diet. - Continue present medications. Continue twice  daily PPI. Add Carafate suspension 1 g 4 times  daily x5 days. - Await pathology results. - Repeat upper endoscopy for surveillance based on  pathology results.  Subjective: Feels fine, wants to go home. No trouble breathing, shortness of breath, fever. Tolerating diet with tolerable abdominal pain.  Discharge Exam: Vitals:   08/26/18 0513 08/26/18 1454  BP: 129/84 122/75  Pulse: (!) 55 66  Resp: 18 16  Temp: 98.1 F (36.7 C) 98.7 F (37.1 C)  SpO2: 100% 96%   General: Pt is alert, awake, not in acute distress Cardiovascular: RRR, S1/S2 +, no rubs, no gallops Respiratory: CTA bilaterally, no wheezing, no rhonchi Abdominal: Soft, NT, ND, bowel sounds + Extremities: No edema, no cyanosis  Labs: BNP (last 3 results) No results for input(s): BNP in the last 8760 hours. Basic Metabolic Panel: Recent Labs  Lab 08/22/18 0635 08/23/18 0204 08/24/18 0514 08/25/18 0507 08/26/18 0518  NA 137 135 138 138 135  K 3.8 3.3* 3.4* 3.6 3.3*  CL 104 106 113* 107 103  CO2 25 22 20* 25 26  GLUCOSE 99 95 88 88 86  BUN 13 14 10 10 8   CREATININE 0.68 0.72 0.68 0.67 0.62  CALCIUM 8.4* 8.0* 8.2* 7.9* 8.3*  MG  --   --  2.0  --   --    Liver Function Tests: Recent Labs  Lab 08/21/18 2143 08/22/18 0635 08/23/18 0204 08/24/18 0514 08/26/18 0518  AST 207* 124* 128* 90* 56*  ALT 666* 516* 400* 304* 200*  ALKPHOS 95 80 71 66 61  BILITOT 0.6 0.5 0.9 0.3 0.5  PROT 7.3 6.3* 6.1* 5.8* 5.4*  ALBUMIN 3.7 3.1* 3.0* 2.8* 2.7*   No results for input(s): LIPASE, AMYLASE in the last 168 hours. No results for input(s): AMMONIA  in the last 168 hours. CBC: Recent Labs  Lab 08/21/18 2143 08/22/18 0635 08/23/18 0204 08/25/18 0507 08/26/18 0518  WBC 14.4* 14.3* 5.9 4.8 2.9*  NEUTROABS 12.9* 12.4* 4.0  --   --   HGB 13.3 11.9* 11.2* 10.3* 10.2*  HCT 43.1 39.3 36.9* 33.3* 33.1*  MCV 84.7 86.0 86.0 86.5 85.5  PLT 317 284 261 265 258   Cardiac Enzymes: Recent Labs  Lab 08/22/18 1444 08/22/18 2027 08/23/18 0203  TROPONINI <0.03 <0.03 <0.03   BNP: Invalid input(s): POCBNP CBG: No results for input(s): GLUCAP in the last 168 hours. D-Dimer No results for input(s): DDIMER in the last 72 hours. Hgb A1c No results for input(s): HGBA1C in the last 72 hours. Lipid Profile No results for input(s): CHOL, HDL, LDLCALC, TRIG, CHOLHDL, LDLDIRECT in the last 72 hours. Thyroid function studies No results for input(s): TSH, T4TOTAL, T3FREE, THYROIDAB in the last 72 hours.  Invalid input(s): FREET3 Anemia work up No results for input(s): VITAMINB12, FOLATE, FERRITIN, TIBC, IRON, RETICCTPCT in the last 72 hours. Urinalysis    Component Value Date/Time   COLORURINE YELLOW 08/22/2018 1500   APPEARANCEUR CLEAR 08/22/2018 1500   LABSPEC 1.018 08/22/2018 1500   PHURINE 7.0 08/22/2018  [place] 08/22/2018 1500   HGBUR SMALL (A) 08/22/2018 1500   BILIRUBINUR NEGATIVE 08/22/2018 1500   KETONESUR NEGATIVE 08/22/2018 1500   PROTEINUR NEGATIVE 08/22/2018 1500   UROBILINOGEN 0.2 03/03/2012 0205   NITRITE NEGATIVE 08/22/2018 1500   LEUKOCYTESUR NEGATIVE 08/22/2018 1500    Microbiology Recent Results (from the past 240 hour(s))  Blood Culture (routine x 2)     Status: None   Collection Time: 08/21/18  9:44 PM  Result Value Ref Range Status   Specimen Description BLOOD RIGHT ARM  Final   Special Requests   Final    BOTTLES DRAWN AEROBIC ONLY Blood Culture results may not be optimal due to an inadequate volume of blood received in culture bottles   Culture   Final    NO GROWTH 5 DAYS Performed at  [facility], [address], [place], [place] [address]    Report Status 08/26/2018 FINAL  Final  Blood Culture (routine x 2)     Status: None   Collection Time: 08/21/18 10:00 PM  Result Value Ref Range Status   Specimen Description BLOOD LEFT HAND  Final   Special Requests   Final    BOTTLES DRAWN AEROBIC AND ANAEROBIC Blood Culture results may not be optimal due to an excessive volume of blood received in culture bottles   Culture   Final    NO GROWTH 5 DAYS Performed at [facility], [address], [place], [place] [address]    Report Status 08/26/2018 FINAL  Final  Culture, Urine     Status: None   Collection Time: 08/22/18  3:00 PM  Result Value Ref Range Status   Specimen Description   Final    URINE, CLEAN CATCH Performed at [facility], [address], [place], [place] [address]    Special Requests   Final    NONE Performed at [facility], [address], [place], [place] [address]    Culture   Final    NO GROWTH Performed at [facility], [place] [address], [place], [place] [address]    Report Status 08/23/2018 FINAL  Final    Time coordinating discharge: Approximately 40 minutes  Patrecia Pour, MD  [facility] 08/26/2018, 3:06 PM Pager [phone]

## 2018-08-27 ENCOUNTER — Encounter (HOSPITAL_COMMUNITY): Payer: Self-pay | Admitting: Internal Medicine

## 2018-08-29 ENCOUNTER — Encounter: Payer: Self-pay | Admitting: Internal Medicine

## 2018-08-30 ENCOUNTER — Telehealth: Payer: Self-pay | Admitting: Gastroenterology

## 2018-08-30 ENCOUNTER — Telehealth: Payer: Self-pay

## 2018-08-30 NOTE — Telephone Encounter (Signed)
Per RMR- Send letter to patient.  Send copy of letter with path to referring provider and PCP.   Needs OV in 3 months if not already scheduled to set up a repeat EGD at that time.

## 2018-08-30 NOTE — Telephone Encounter (Signed)
Forwarding to AM, this is RMR pt.

## 2018-08-30 NOTE — Telephone Encounter (Signed)
Patient needs LFTs in two weeks. Dx: abnormal LFTs.

## 2018-08-31 ENCOUNTER — Other Ambulatory Visit (HOSPITAL_COMMUNITY)
Admission: RE | Admit: 2018-08-31 | Discharge: 2018-08-31 | Disposition: A | Discharge status: Home / Self Care | Payer: Medicaid Other | Source: Ambulatory Visit | Attending: Physician Assistant | Admitting: Physician Assistant

## 2018-08-31 ENCOUNTER — Ambulatory Visit: Payer: Medicaid Other | Admitting: Physician Assistant

## 2018-08-31 ENCOUNTER — Encounter: Payer: Self-pay | Admitting: Physician Assistant

## 2018-08-31 VITALS — BP 140/78 | HR 60 | Temp 97.5°F | Wt 127.0 lb

## 2018-08-31 DIAGNOSIS — R740 Nonspecific elevation of levels of transaminase and lactic acid dehydrogenase [LDH]: Secondary | ICD-10-CM

## 2018-08-31 DIAGNOSIS — Z7689 Persons encountering health services in other specified circumstances: Secondary | ICD-10-CM

## 2018-08-31 DIAGNOSIS — F1911 Other psychoactive substance abuse, in remission: Secondary | ICD-10-CM

## 2018-08-31 DIAGNOSIS — K2100 Gastro-esophageal reflux disease with esophagitis, without bleeding: Secondary | ICD-10-CM

## 2018-08-31 DIAGNOSIS — I10 Essential (primary) hypertension: Secondary | ICD-10-CM | POA: Diagnosis not present

## 2018-08-31 DIAGNOSIS — Z8673 Personal history of transient ischemic attack (TIA), and cerebral infarction without residual deficits: Secondary | ICD-10-CM

## 2018-08-31 DIAGNOSIS — R7401 Elevation of levels of liver transaminase levels: Secondary | ICD-10-CM

## 2018-08-31 DIAGNOSIS — E46 Unspecified protein-calorie malnutrition: Secondary | ICD-10-CM

## 2018-08-31 DIAGNOSIS — R69 Illness, unspecified: Secondary | ICD-10-CM | POA: Diagnosis present

## 2018-08-31 DIAGNOSIS — E876 Hypokalemia: Secondary | ICD-10-CM

## 2018-08-31 DIAGNOSIS — D649 Anemia, unspecified: Secondary | ICD-10-CM

## 2018-08-31 DIAGNOSIS — R531 Weakness: Secondary | ICD-10-CM

## 2018-08-31 DIAGNOSIS — E785 Hyperlipidemia, unspecified: Secondary | ICD-10-CM

## 2018-08-31 LAB — COMPREHENSIVE METABOLIC PANEL
ALBUMIN: 3.1 g/dL — AB (ref 3.5–5.0)
ALK PHOS: 58 U/L (ref 38–126)
ALT: 100 U/L — ABNORMAL HIGH (ref 0–44)
ANION GAP: 7 (ref 5–15)
AST: 30 U/L (ref 15–41)
BILIRUBIN TOTAL: 0.1 mg/dL — AB (ref 0.3–1.2)
BUN: 7 mg/dL (ref 6–20)
CALCIUM: 8.7 mg/dL — AB (ref 8.9–10.3)
CO2: 32 mmol/L (ref 22–32)
Chloride: 101 mmol/L (ref 98–111)
Creatinine, Ser: 0.71 mg/dL (ref 0.61–1.24)
GFR calc Af Amer: >60 mL/min (ref >60)
GLUCOSE: 93 mg/dL (ref 70–99)
POTASSIUM: 3.3 mmol/L — AB (ref 3.5–5.1)
Sodium: 140 mmol/L (ref 135–145)
TOTAL PROTEIN: 6.2 g/dL — AB (ref 6.5–8.1)

## 2018-08-31 MED ORDER — PANTOPRAZOLE SODIUM 40 MG PO TBEC: 40.0000 mg | DELAYED_RELEASE_TABLET | Freq: Two times a day (BID) | ORAL | 0 refills | Status: DC

## 2018-08-31 MED ORDER — SUCRALFATE 1 G PO TABS: 1.0000 g | ORAL_TABLET | Freq: Three times a day (TID) | ORAL | 1 refills | Status: DC

## 2018-08-31 MED ORDER — DILTIAZEM HCL ER COATED BEADS 120 MG PO CP24: 120.0000 mg | ORAL_CAPSULE | Freq: Every day | ORAL | 1 refills | Status: DC

## 2018-08-31 NOTE — Patient Instructions (Addendum)
   Call and check on medicaid  Call and schedule follow up appointment with gastroenterologist.  Get labs/blood test

## 2018-08-31 NOTE — Telephone Encounter (Signed)
Reminder in epic °

## 2018-08-31 NOTE — Progress Notes (Signed)
BP 140/78 (BP Location: Left Arm, Patient Position: Sitting, Cuff Size: Normal)   Pulse 60   Temp (!) 97.5 F (36.4 C)   Wt 127 lb (57.6 kg)   SpO2 97%   BMI 18.75 kg/m    Subjective:    Patient ID: Evan Chavez, male    DOB: Sep 15, 1961, 57 y.o.   MRN: [id number]  HPI: Evan Chavez is a 57 y.o. male presenting on 08/31/2018 for New Patient (Initial Visit) (pt is here today with his mother. pt went to [facility] due to stoke 06/2018.)   HPI   Chief Complaint  Patient presents with  . New Patient (Initial Visit)    pt is here today with his mother. pt went to [facility] due to stoke 06/2018.    pt s/p cva september 2019  Hx polysubstance abuse (cocaine, alcohol, tobacco)  and htn.  Pt states no substance abuse since being discharged for hospital in september  Recent admission november 2019 for HCAP and esophageal erosions.  Additional diagnoses:  htn, dyslipidemia, elevated LFTs, SVT, hypokalemia  Pt has not yet scheduled for follow up with GI.  He has phone number but just hasn't called  Pt says he has applied for medicaid but hasn't heard on that yet.    Pt stayed at [facility] rehab for a month after his stroke.  He is still having R side weakness and problems getting around.  He is using a rolling walker.  He has moved in with his mother because he needs help with ADLs.   Pt has completed his antibiotics.  He still has a bit of lingering cough but is not having any fevers or problems breathing.    Relevant past medical, surgical, family and social history reviewed and updated as indicated. Interim medical history since our last visit reviewed. Allergies and medications reviewed and updated.   Current Outpatient Medications:  .  aspirin 325 MG tablet, Take 1 tablet (325 mg total) by mouth daily., Disp: , Rfl:  .  diltiazem (CARDIZEM CD) 120 MG 24 hr capsule, Take 1 capsule (120 mg total) by mouth daily., Disp: , Rfl:  .  Multiple Vitamin (MULTIVITAMIN WITH MINERALS) TABS tablet,  Take 1 tablet by mouth daily., Disp: , Rfl:  .  pantoprazole (PROTONIX) 40 MG tablet, Take 1 tablet (40 mg total) by mouth 2 (two) times daily before a meal., Disp: 60 tablet, Rfl: 0 .  atorvastatin (LIPITOR) 10 MG tablet, Take 1 tablet (10 mg total) by mouth daily at 6 PM. Do NOT restart until followed up with PCP (Patient not taking: Reported on 08/31/2018), Disp: , Rfl:  .  sucralfate (CARAFATE) 1 GM/10ML suspension, Take 10 mLs (1 g total) by mouth 4 (four) times daily -  with meals and at bedtime for 4 days. (Patient not taking: Reported on 08/31/2018), Disp: 160 mL, Rfl: 0   Review of Systems  Constitutional: Positive for appetite change, fatigue and unexpected weight change. Negative for chills, diaphoresis and fever.  HENT: Positive for congestion, dental problem, sore throat and trouble swallowing. Negative for drooling, ear pain, facial swelling, hearing loss, mouth sores, sneezing and voice change.   Eyes: Negative for pain, discharge, redness, itching and visual disturbance.  Respiratory: Positive for cough. Negative for choking, shortness of breath and wheezing.   Cardiovascular: Negative for chest pain, palpitations and leg swelling.  Gastrointestinal: Negative for abdominal pain, blood in stool, constipation, diarrhea and vomiting.  Endocrine: Positive for heat intolerance and polydipsia. Negative  for cold intolerance.  Genitourinary: Positive for decreased urine volume. Negative for dysuria and hematuria.  Musculoskeletal: Negative for arthralgias, back pain and gait problem.  Skin: Negative for rash.  Allergic/Immunologic: Negative for environmental allergies.  Neurological: Negative for seizures, syncope, light-headedness and headaches.  Hematological: Negative for adenopathy.  Psychiatric/Behavioral: Positive for agitation and dysphoric mood. Negative for suicidal ideas. The patient is nervous/anxious.     Per HPI unless specifically indicated above     Objective:    BP  140/78 (BP Location: Left Arm, Patient Position: Sitting, Cuff Size: Normal)   Pulse 60   Temp (!) 97.5 F (36.4 C)   Wt 127 lb (57.6 kg)   SpO2 97%   BMI 18.75 kg/m   Wt Readings from Last 3 Encounters:  08/31/18 127 lb (57.6 kg)  08/21/18 131 lb (59.4 kg)  07/12/18 131 lb 13.4 oz (59.8 kg)    Physical Exam  Constitutional: He is oriented to person, place, and time.  Pt very thin.  His pants are way too large and are held up with suspenders (these previously fit the pt)  HENT:  Head: Normocephalic and atraumatic.  Mouth/Throat: Oropharynx is clear and moist. No oropharyngeal exudate.  Eyes: Pupils are equal, round, and reactive to light. Conjunctivae and EOM are normal.  Neck: Neck supple. No thyromegaly present.  Cardiovascular: Normal rate and regular rhythm.  Pulmonary/Chest: Effort normal and breath sounds normal. He has no wheezes. He has no rales.  Abdominal: Soft. Bowel sounds are normal. He exhibits no mass. There is no hepatosplenomegaly. There is no tenderness.  Musculoskeletal: He exhibits no edema.  Lymphadenopathy:    He has no cervical adenopathy.  Neurological: He is alert and oriented to person, place, and time. He displays no tremor. A cranial nerve deficit is present. Coordination and gait abnormal.  R sided weakness including facial drooping of mouth, shoulder shrugging, R grip, upper arm strength, LE strength- all R side testing indicated weakness.   Pt ambulating slowly with rolling walker.   Skin: Skin is warm and dry. No rash noted.  Psychiatric: He has a normal mood and affect. His behavior is normal. Thought content normal.  Pt very quiet and doesn't talk much.  His mother provides much of the history.   Vitals reviewed.       Assessment & Plan:     Encounter Diagnoses  Name Primary?  . Encounter to establish care Yes  . Essential hypertension   . Elevated transaminase level   . Hypokalemia   . History of stroke   . Gastroesophageal reflux  disease with esophagitis   . Dyslipidemia   . Right sided weakness   . Protein-calorie malnutrition, unspecified severity ([place])   . Anemia, unspecified type   . Substance abuse in remission ([place])      -Check CMP -recommended Boost or ensure and healthy diet to help improve nutritional status -pt to call to get Scheduled for follow up with GI for esophageal erosions -pt to Check on medicaid status -hold statin for now - will consider restarting after re-evaluation of  LFTs -pt to continue other medications with only change is switch carafate from liquid to table due to pt unable to afford the liquid  -pt to follow up here 1 month.  RTO sooner prn

## 2018-09-01 ENCOUNTER — Other Ambulatory Visit: Payer: Self-pay

## 2018-09-06 ENCOUNTER — Other Ambulatory Visit: Payer: Self-pay

## 2018-09-06 ENCOUNTER — Emergency Department (HOSPITAL_COMMUNITY): Payer: Medicaid Other

## 2018-09-06 ENCOUNTER — Encounter (HOSPITAL_COMMUNITY): Payer: Self-pay | Admitting: Emergency Medicine

## 2018-09-06 ENCOUNTER — Emergency Department (HOSPITAL_COMMUNITY)
Admission: EM | Admit: 2018-09-06 | Discharge: 2018-09-07 | Disposition: A | Discharge status: Home / Self Care | Payer: Medicaid Other | Attending: Emergency Medicine | Admitting: Emergency Medicine

## 2018-09-06 DIAGNOSIS — K5641 Fecal impaction: Secondary | ICD-10-CM | POA: Diagnosis not present

## 2018-09-06 DIAGNOSIS — R945 Abnormal results of liver function studies: Secondary | ICD-10-CM

## 2018-09-06 DIAGNOSIS — R0600 Dyspnea, unspecified: Secondary | ICD-10-CM | POA: Diagnosis not present

## 2018-09-06 DIAGNOSIS — R7989 Other specified abnormal findings of blood chemistry: Secondary | ICD-10-CM

## 2018-09-06 DIAGNOSIS — R059 Cough, unspecified: Secondary | ICD-10-CM

## 2018-09-06 DIAGNOSIS — Z87891 Personal history of nicotine dependence: Secondary | ICD-10-CM | POA: Insufficient documentation

## 2018-09-06 DIAGNOSIS — Z7982 Long term (current) use of aspirin: Secondary | ICD-10-CM | POA: Insufficient documentation

## 2018-09-06 DIAGNOSIS — I10 Essential (primary) hypertension: Secondary | ICD-10-CM | POA: Insufficient documentation

## 2018-09-06 DIAGNOSIS — Z8673 Personal history of transient ischemic attack (TIA), and cerebral infarction without residual deficits: Secondary | ICD-10-CM | POA: Diagnosis not present

## 2018-09-06 DIAGNOSIS — Z79899 Other long term (current) drug therapy: Secondary | ICD-10-CM | POA: Insufficient documentation

## 2018-09-06 DIAGNOSIS — R05 Cough: Secondary | ICD-10-CM | POA: Diagnosis not present

## 2018-09-06 DIAGNOSIS — K6289 Other specified diseases of anus and rectum: Secondary | ICD-10-CM | POA: Diagnosis present

## 2018-09-06 NOTE — ED Triage Notes (Signed)
Pt C/O cough that started on Saturday. Pt states he was D/C from the hospital last Thursday for pneumoniae. Pt states he has noticed that he is breathing harder and having a dry cough.

## 2018-09-07 ENCOUNTER — Other Ambulatory Visit: Payer: Self-pay

## 2018-09-07 LAB — CBC WITH DIFFERENTIAL/PLATELET
Abs Immature Granulocytes: 0.01 10*3/uL (ref 0.00–0.07)
BASOS PCT: 1 %
Basophils Absolute: 0 10*3/uL (ref 0.0–0.1)
EOS ABS: 0.1 10*3/uL (ref 0.0–0.5)
Eosinophils Relative: 2 %
HCT: 32.4 % — ABNORMAL LOW (ref 39.0–52.0)
Hemoglobin: 9.7 g/dL — ABNORMAL LOW (ref 13.0–17.0)
Immature Granulocytes: 0 %
Lymphocytes Relative: 29 %
Lymphs Abs: 1.2 10*3/uL (ref 0.7–4.0)
MCH: 26 pg (ref 26.0–34.0)
MCHC: 29.9 g/dL — ABNORMAL LOW (ref 30.0–36.0)
MCV: 86.9 fL (ref 80.0–100.0)
MONO ABS: 0.4 10*3/uL (ref 0.1–1.0)
MONOS PCT: 11 %
NEUTROS PCT: 57 %
Neutro Abs: 2.3 10*3/uL (ref 1.7–7.7)
PLATELETS: 271 10*3/uL (ref 150–400)
RBC: 3.73 MIL/uL — AB (ref 4.22–5.81)
RDW: 14.2 % (ref 11.5–15.5)
WBC: 4 10*3/uL (ref 4.0–10.5)
nRBC: 0 % (ref 0.0–0.2)

## 2018-09-07 LAB — BASIC METABOLIC PANEL
Anion gap: 8 (ref 5–15)
BUN: 11 mg/dL (ref 6–20)
CALCIUM: 8.8 mg/dL — AB (ref 8.9–10.3)
CO2: 28 mmol/L (ref 22–32)
CREATININE: 0.72 mg/dL (ref 0.61–1.24)
Chloride: 102 mmol/L (ref 98–111)
GFR calc Af Amer: >60 mL/min (ref >60)
GFR calc non Af Amer: >60 mL/min (ref >60)
GLUCOSE: 101 mg/dL — AB (ref 70–99)
Potassium: 3.5 mmol/L (ref 3.5–5.1)
Sodium: 138 mmol/L (ref 135–145)

## 2018-09-07 LAB — TROPONIN I: Troponin I: <0.03 ng/mL (ref <0.03)

## 2018-09-07 MED ORDER — FLEET ENEMA 7-19 GM/118ML RE ENEM
1.0000 | ENEMA | Freq: Once | RECTAL | Status: AC
  Administered 2018-09-07: 1 via RECTAL

## 2018-09-07 NOTE — ED Notes (Signed)
Handed EKG to DR.

## 2018-09-07 NOTE — ED Provider Notes (Signed)
[facility] EMERGENCY DEPARTMENT Provider Note   CSN: [id number] Arrival date & time: 09/06/18  2023     History   Chief Complaint Chief Complaint  Patient presents with  . Cough    HPI Evan Chavez is a 57 y.o. male.  HPI 57 year old male with history of stroke, hyperlipidemia and recent admission for pneumonia comes in with chief complaint of cough and rectal pain.  Patient states that over the past 3 days he has had some nonproductive cough and he has felt like he is more short of breath.  His symptoms were similar to his pneumonia therefore he decided to come in.  He denies any fevers, wheezing.  Patient has no known history of PE-DVT.  He also denies any chest pain.  Additionally, patient also states that he has been having some rectal pain over the past few days.  Patient has been constipated and was given stool softeners earlier today.  His last bowel movement was yesterday.  Past Medical History:  Diagnosis Date  . Cocaine abuse ([place])   . ETOH abuse   . GERD (gastroesophageal reflux disease)   . Hyperlipidemia   . Stroke (cerebrum) ([place]) 06/2018    Patient Active Problem List   Diagnosis Date Noted  . Abnormality of esophagus   . Gastritis and gastroduodenitis   . Gastroesophageal reflux disease with esophagitis   . Lobar pneumonia ([place]) 08/22/2018  . Sepsis due to undetermined organism ([place]) 08/22/2018  . Fever   . Precordial pain   . HCAP (healthcare-associated pneumonia) 08/21/2018  . Elevated transaminase level 08/21/2018  . Atrial tachycardia ([place]) 07/13/2018  . Dyslipidemia 07/13/2018  . Acute ischemic stroke ([place]) 07/12/2018  . Essential hypertension 07/12/2018  . Tobacco abuse 07/12/2018  . Cocaine abuse ([place]) 07/12/2018  . Ischemic stroke [facility])     Past Surgical History:  Procedure Laterality Date  . BIOPSY  08/25/2018   Procedure: BIOPSY;  Surgeon: [name], MD;  Location: [facility] ENDO SUITE;  Service: Endoscopy;;  .  ESOPHAGOGASTRODUODENOSCOPY (EGD) WITH PROPOFOL N/A 08/25/2018   Procedure: ESOPHAGOGASTRODUODENOSCOPY (EGD) WITH PROPOFOL;  Surgeon: [name], MD;  Location: [facility] ENDO SUITE;  Service: Endoscopy;  Laterality: N/A;        Home Medications    Prior to Admission medications   Medication Sig Start Date End Date Taking? Authorizing Provider  aspirin 325 MG tablet Take 1 tablet (325 mg total) by mouth daily. 07/16/18   Barton Dubois, MD  diltiazem (CARDIZEM CD) 120 MG 24 hr capsule Take 1 capsule (120 mg total) by mouth daily. 08/31/18   Soyla Dryer, PA-C  Multiple Vitamin (MULTIVITAMIN WITH MINERALS) TABS tablet Take 1 tablet by mouth daily.    [provider]  pantoprazole (PROTONIX) 40 MG tablet Take 1 tablet (40 mg total) by mouth 2 (two) times daily before a meal. 08/31/18   Soyla Dryer, PA-C  sucralfate (CARAFATE) 1 g tablet Take 1 tablet (1 g total) by mouth 4 (four) times daily -  with meals and at bedtime. 08/31/18   Soyla Dryer, PA-C    Family History Family History  Problem Relation Age of Onset  . Hypertension Father   . Heart attack Father   . Heart disease Father   . Hypertension Mother   . GER disease Mother   . Thyroid disease Mother   . Colon cancer Neg Hx   . Colon polyps Neg Hx     Social History Social History   Tobacco Use  .  Smoking status: Former Smoker    Packs/day: 1.00    Years: 40.00    Pack years: 40.00    Types: Cigarettes    Last attempt to quit: 06/2018    Years since quitting: 0.2  . Smokeless tobacco: Never Used  Substance Use Topics  . Alcohol use: Not Currently    Alcohol/week: 12.0 standard drinks    Types: 12 Cans of beer per week    Comment: none since 06/2018  . Drug use: Not Currently    Types: Cocaine    Comment: last cocaine use 07/12/18     Allergies   Patient has no known allergies.   Review of Systems Review of Systems  Constitutional: Positive for activity change.  Respiratory: Positive for  cough and shortness of breath. Negative for wheezing.   Cardiovascular: Negative for chest pain.  Gastrointestinal: Positive for constipation. Negative for abdominal pain.  Allergic/Immunologic: Negative for immunocompromised state.  Hematological: Does not bruise/bleed easily.  All other systems reviewed and are negative.    Physical Exam Updated Vital Signs BP (!) 175/125   Pulse 66   Temp 98.7 F (37.1 C) (Oral)   Resp (!) 36   SpO2 99%   Physical Exam  Constitutional: He is oriented to person, place, and time. He appears well-developed.  HENT:  Head: Atraumatic.  Neck: Neck supple.  Cardiovascular: Normal rate.  Pulmonary/Chest: Effort normal.  Abdominal: Soft. There is no tenderness.  Genitourinary:  Genitourinary Comments: Rectal exam reveals large stool burden in the rectum.  Neurological: He is alert and oriented to person, place, and time.  Skin: Skin is warm.  Nursing note and vitals reviewed.    ED Treatments / Results  Labs (all labs ordered are listed, but only abnormal results are displayed) Labs Reviewed  CBC WITH DIFFERENTIAL/PLATELET - Abnormal; Notable for the following components:      Result Value   RBC 3.73 (*)    Hemoglobin 9.7 (*)    HCT 32.4 (*)    MCHC 29.9 (*)    All other components within normal limits  BASIC METABOLIC PANEL - Abnormal; Notable for the following components:   Glucose, Bld 101 (*)    Calcium 8.8 (*)    All other components within normal limits  TROPONIN I    EKG None  Radiology Dg Chest 2 View  Result Date: 09/06/2018 CLINICAL DATA:  Cough, mid chest pain and shortness of breath for 3 days. EXAM: CHEST - 2 VIEW COMPARISON:  08/21/2018 FINDINGS: Cardiomediastinal silhouette is normal. Mediastinal contours appear intact. There is no evidence of focal airspace consolidation, pleural effusion or pneumothorax. Elevation of the left hemidiaphragm. Osseous structures are without acute abnormality. Soft tissues are grossly  normal. IMPRESSION: Elevation of the left hemidiaphragm without evidence of airspace consolidation or effusion. Electronically Signed   By: Fidela Salisbury M.D.   On: 09/06/2018 21:37    Procedures Fecal disimpaction Date/Time: 09/07/2018 2:38 AM Performed by: Varney Biles, MD Authorized by: Varney Biles, MD  Consent: Verbal consent obtained. Risks and benefits: risks, benefits and alternatives were discussed Consent given by: patient Patient understanding: patient states understanding of the procedure being performed Patient identity confirmed: arm band Local anesthesia used: no  Anesthesia: Local anesthesia used: no  Sedation: Patient sedated: no  Patient tolerance: Patient tolerated the procedure well with no immediate complications Comments: 2 stool balls were removed manually.  At that point patient asked [place] to stop the procedure because of the discomfort.  No complications were appreciated.    (  including critical care time)  Medications Ordered in ED Medications  sodium phosphate (FLEET) 7-19 GM/118ML enema 1 enema (has no administration in time range)     Initial Impression / Assessment and Plan / ED Course  I have reviewed the triage vital signs and the nursing notes.  Pertinent labs & imaging results that were available during my care of the patient were reviewed by me and considered in my medical decision making (see chart for details).  Clinical Course as of Sep 08 239  Tue Sep 07, 2018  0239 Results from the ER workup discussed with the patient face to face and all questions answered to the best of my ability.  Patient has 0 sirs criteria, no respiratory distress or hypoxia.  Lung exam is clear and his chest x-ray does not reveal any evidence of focal pneumonia.  These results were shared with the patient and the family.  They are comfortable with careful monitoring from their side.  They will return to the ER if patient starts having worsening  shortness of breath, productive cough with yellow-green phlegm, fevers, confusion or increased weakness.    [AN]    Clinical Course User Index [AN] Varney Biles, MD    57 year old male with recent history of stroke and a recent admission for pneumonia comes in with chief complaint of shortness of breath, cough and rectal pain.  Rectal pain appears to be because of severe constipation and fecal impaction.  Patient was manually disimpacted and enema has been ordered.  Bowel regimen has been discussed with the family and they will start him on stool softener and increase fiber in his diet.  Additionally, patient's lung exam is clear and chest x-ray, white count are normal.  Patient has 0 sirs criteria and he is not hypoxic.  During last admission he had x-ray that showed a pneumonia followed by CT PE which showed pneumonia without any PE.  Given the recent negative CT PE along with clinical picture of no tachycardia, tachypnea or hypoxia leads me to a pretest probability for PE to be extremely low, and we will not pursue d-dimer or CT PE at this time.  X-ray does not show any pneumonia which is reassuring.  Lung exam was clear as well.  We will discuss weight and watch approach with the patient with strict ER return precautions to see if they are comfortable with the plan.  Final Clinical Impressions(s) / ED Diagnoses   Final diagnoses:  Fecal impaction in rectum (HCC)  Dyspnea, unspecified type  Cough    ED Discharge Orders    None       Varney Biles, MD 09/07/18 [phone]

## 2018-09-07 NOTE — Telephone Encounter (Signed)
Spoke with pts mother. Lab orders were mailed and pt will have lab work done next Monday. Orders were faxed to [facility] lab.

## 2018-09-07 NOTE — Discharge Instructions (Addendum)
We signed the ER for cough, shortness of breath and rectal pain.  You are noted to be constipated.  We encourage you to increase water in your diet and also fiber in your diet.  You may take stool softeners over the next few days as well.  X-ray and lab work here do not show any evidence of pneumonia.  We recommend that you keep a close eye on your symptoms, if your symptoms are getting worse or you start having increasing shortness of breath, fevers, dizziness or near fainting -return to the ER immediately.

## 2018-09-15 ENCOUNTER — Other Ambulatory Visit: Payer: Self-pay

## 2018-09-15 DIAGNOSIS — R945 Abnormal results of liver function studies: Secondary | ICD-10-CM

## 2018-09-15 DIAGNOSIS — R7989 Other specified abnormal findings of blood chemistry: Secondary | ICD-10-CM

## 2018-09-16 ENCOUNTER — Other Ambulatory Visit (HOSPITAL_COMMUNITY)
Admission: RE | Admit: 2018-09-16 | Discharge: 2018-09-16 | Disposition: A | Discharge status: Home / Self Care | Payer: Medicaid Other | Source: Ambulatory Visit | Attending: Internal Medicine | Admitting: Internal Medicine

## 2018-09-16 DIAGNOSIS — R945 Abnormal results of liver function studies: Secondary | ICD-10-CM | POA: Insufficient documentation

## 2018-09-16 LAB — HEPATIC FUNCTION PANEL
ALBUMIN: 3.9 g/dL (ref 3.5–5.0)
ALT: 35 U/L (ref 0–44)
AST: 20 U/L (ref 15–41)
Alkaline Phosphatase: 82 U/L (ref 38–126)
Bilirubin, Direct: <0.1 mg/dL (ref 0.0–0.2)
TOTAL PROTEIN: 8 g/dL (ref 6.5–8.1)
Total Bilirubin: 0.3 mg/dL (ref 0.3–1.2)

## 2018-09-22 ENCOUNTER — Ambulatory Visit: Payer: Self-pay | Admitting: Physician Assistant

## 2018-09-22 ENCOUNTER — Other Ambulatory Visit (HOSPITAL_COMMUNITY)
Admission: RE | Admit: 2018-09-22 | Discharge: 2018-09-22 | Disposition: A | Discharge status: Home / Self Care | Payer: Medicaid Other | Source: Ambulatory Visit | Attending: Physician Assistant | Admitting: Physician Assistant

## 2018-09-22 ENCOUNTER — Encounter: Payer: Self-pay | Admitting: Physician Assistant

## 2018-09-22 VITALS — BP 132/80 | HR 84 | Temp 98.2°F | Wt 130.0 lb

## 2018-09-22 DIAGNOSIS — R350 Frequency of micturition: Secondary | ICD-10-CM | POA: Insufficient documentation

## 2018-09-22 DIAGNOSIS — R319 Hematuria, unspecified: Secondary | ICD-10-CM

## 2018-09-22 LAB — POCT URINALYSIS DIPSTICK
BILIRUBIN UA: NEGATIVE
Glucose, UA: NEGATIVE
KETONES UA: NEGATIVE
LEUKOCYTES UA: NEGATIVE
Nitrite, UA: NEGATIVE
Protein, UA: POSITIVE — AB
SPEC GRAV UA: 1.015 (ref 1.010–1.025)
Urobilinogen, UA: 0.2 E.U./dL
pH, UA: 7.5 (ref 5.0–8.0)

## 2018-09-22 MED ORDER — DILTIAZEM HCL ER COATED BEADS 120 MG PO CP24: 120.0000 mg | ORAL_CAPSULE | Freq: Every day | ORAL | 1 refills | Status: DC

## 2018-09-22 MED ORDER — PANTOPRAZOLE SODIUM 40 MG PO TBEC: 40.0000 mg | DELAYED_RELEASE_TABLET | Freq: Two times a day (BID) | ORAL | 1 refills | Status: AC

## 2018-09-22 NOTE — Progress Notes (Signed)
   BP 132/80   Pulse 84   Temp 98.2 F (36.8 C)   Wt 130 lb (59 kg)   SpO2 99%   BMI 19.20 kg/m    Subjective:    Patient ID: Evan Chavez, male    DOB: 02-17-1961, 57 y.o.   MRN: [id number]  HPI: Evan Chavez is a 57 y.o. male presenting on 09/22/2018 for No chief complaint on file.   HPI   Pt is here with his mother today.  She provides majority of history.    Pt's mom says it would likely be february until he hears on his medicaid application (was what she was told earlier this week)  Pt has appointment with GI  He went to dr in [employer] for [facility] ???  At [facility]-   Pt's mother doesn't really know what this was for but thinks it was related to screening for medicaid.  She says that they told her the pt would need to be a pt at [facility] were not going to treat the pt  Pt is in today because his mother Evan Chavez he is eating too much and because he Overfills the urinal overnight and has to get up to go to the bathroom.  Pt denies abdominal pain.  Pt denies dysuria.  Pt says he is feeling okay.   Pt has had no fevers.     Relevant past medical, surgical, family and social history reviewed and updated as indicated. Interim medical history since our last visit reviewed. Allergies and medications reviewed and updated.  Review of Systems  Respiratory: Negative for shortness of breath.   Cardiovascular: Negative for chest pain.  Gastrointestinal: Negative for abdominal pain.  Endocrine: Negative for polydipsia.  Genitourinary: Negative for decreased urine volume, dysuria and hematuria.    Per HPI unless specifically indicated above     Objective:    BP 132/80   Pulse 84   Temp 98.2 F (36.8 C)   Wt 130 lb (59 kg)   SpO2 99%   BMI 19.20 kg/m   Wt Readings from Last 3 Encounters:  09/22/18 130 lb (59 kg)  08/31/18 127 lb (57.6 kg)  08/21/18 131 lb (59.4 kg)    Physical Exam  Constitutional: He is oriented to person, place, and time. He appears well-developed and  well-nourished.  HENT:  Head: Normocephalic and atraumatic.  Neck: Neck supple.  Cardiovascular: Normal rate and regular rhythm.  Pulmonary/Chest: Effort normal and breath sounds normal. He has no wheezes.  Abdominal: Soft. Bowel sounds are normal. There is no hepatosplenomegaly. There is no tenderness.  Musculoskeletal: He exhibits no edema.  Lymphadenopathy:    He has no cervical adenopathy.  Neurological: He is alert and oriented to person, place, and time. Gait (pt is ambulating with his rolling walker withou difficulty) abnormal.  Skin: Skin is warm and dry.  Psychiatric: He has a normal mood and affect. His behavior is normal.  Vitals reviewed.       Assessment & Plan:   Encounter Diagnoses  Name Primary?  . Urinary frequency Yes  . Hematuria, unspecified type      -Will send urine for culture.  Recommended that pt be given several urinals to use.  He should not restrict his fluid intake.   Also he should continue to eat as much (healthy foods) as he would like as he is still underweight.  -pt to follow up in 6 weeks.  RTO sooner prn

## 2018-09-24 LAB — URINE CULTURE

## 2018-10-19 ENCOUNTER — Ambulatory Visit: Payer: Self-pay | Admitting: Physician Assistant

## 2018-10-20 ENCOUNTER — Other Ambulatory Visit: Payer: Self-pay | Admitting: Physician Assistant

## 2018-11-03 ENCOUNTER — Ambulatory Visit: Payer: Self-pay | Admitting: Physician Assistant

## 2018-11-16 ENCOUNTER — Other Ambulatory Visit: Payer: Self-pay

## 2018-11-16 ENCOUNTER — Ambulatory Visit: Payer: Medicaid Other | Admitting: Internal Medicine

## 2018-11-16 ENCOUNTER — Telehealth: Payer: Self-pay

## 2018-11-16 ENCOUNTER — Encounter: Payer: Self-pay | Admitting: Internal Medicine

## 2018-11-16 VITALS — BP 155/85 | HR 78 | Temp 98.2°F | Ht 68.0 in | Wt 145.4 lb

## 2018-11-16 DIAGNOSIS — R1310 Dysphagia, unspecified: Secondary | ICD-10-CM | POA: Diagnosis not present

## 2018-11-16 DIAGNOSIS — R1319 Other dysphagia: Secondary | ICD-10-CM

## 2018-11-16 DIAGNOSIS — K2100 Gastro-esophageal reflux disease with esophagitis, without bleeding: Secondary | ICD-10-CM

## 2018-11-16 DIAGNOSIS — Z8711 Personal history of peptic ulcer disease: Secondary | ICD-10-CM

## 2018-11-16 DIAGNOSIS — K279 Peptic ulcer, site unspecified, unspecified as acute or chronic, without hemorrhage or perforation: Secondary | ICD-10-CM

## 2018-11-16 DIAGNOSIS — Z8719 Personal history of other diseases of the digestive system: Secondary | ICD-10-CM

## 2018-11-16 NOTE — H&P (View-Only) (Signed)
Primary Care Physician:  Rosita Fire, MD Primary Gastroenterologist:  Dr. [name]  Pre-Procedure History & Physical: HPI:  Evan Chavez is a 58 y.o. male here for follow-up of severe reflux esophagitis and peptic ulcer disease (gastric ulcer).  Hospitalized about 3 months ago.  EGD revealed severe esophagitis and gastric ulcer disease.  Biopsies negative for malignancy H. pylori and fungal elements.  He is refrain from taking illicit drugs.  He is been on Protonix 40 mg twice daily.  He does have vague esophageal dysphagia at this time.  He is due for surveillance EGD.  Past Medical History:  Diagnosis Date  . Cocaine abuse ([place])   . ETOH abuse   . GERD (gastroesophageal reflux disease)   . Hyperlipidemia   . Stroke (cerebrum) ([place]) 06/2018    Past Surgical History:  Procedure Laterality Date  . BIOPSY  08/25/2018   Procedure: BIOPSY;  Surgeon: [name], MD;  [facility];  Service: Endoscopy;;  . ESOPHAGOGASTRODUODENOSCOPY (EGD) WITH PROPOFOL N/A 08/25/2018   Procedure: ESOPHAGOGASTRODUODENOSCOPY (EGD) WITH PROPOFOL;  Surgeon: [name], MD;  Location: [facility] ENDO [facility]ITE;  Service: Endoscopy;  Laterality: N/A;    Prior to Admission medications   Medication Sig Start Date End Date Taking? Authorizing Provider  aspirin 325 MG tablet Take 1 tablet (325 mg total) by mouth daily. 07/16/18  Yes Barton Dubois, MD  diltiazem (CARDIZEM CD) 120 MG 24 hr capsule Take 1 capsule (120 mg total) by mouth daily. 09/22/18  Yes Soyla Dryer, PA-C  Multiple Vitamin (MULTIVITAMIN WITH MINERALS) TABS tablet Take 1 tablet by mouth daily.   Yes [provider]  pantoprazole (PROTONIX) 40 MG tablet Take 1 tablet (40 mg total) by mouth 2 (two) times daily before a meal. 09/22/18  Yes Soyla Dryer, PA-C  sucralfate (CARAFATE) 1 g tablet TAKE 1 TABLET BY MOUTH 4 TIMES A DAY WITH MEALS AND AT BEDTIME 10/20/18  Yes Soyla Dryer, PA-C    Allergies as of 11/16/2018  .  (No Known Allergies)    Family History  Problem Relation Age of Onset  . Hypertension Father   . Heart attack Father   . Heart disease Father   . Hypertension Mother   . GER disease Mother   . Thyroid disease Mother   . Colon cancer Neg Hx   . Colon polyps Neg Hx     Social History   Socioeconomic History  . Marital status: Single    Spouse name: Not on file  . Number of children: Not on file  . Years of education: Not on file  . Highest education level: Not on file  Occupational History  . Not on file  Social Needs  . Financial resource strain: Not on file  . Food insecurity:    Worry: Not on file    Inability: Not on file  . Transportation needs:    Medical: Not on file    Non-medical: Not on file  Tobacco Use  . Smoking status: Former Smoker    Packs/day: 1.00    Years: 40.00    Pack years: 40.00    Types: Cigarettes    Last attempt to quit: 06/2018    Years since quitting: 0.4  . Smokeless tobacco: Never Used  Substance and Sexual Activity  . Alcohol use: Not Currently    Alcohol/week: 12.0 standard drinks    Types: 12 Cans of beer per week    Comment: none since 06/2018  . Drug use:  Not Currently    Types: Cocaine    Comment: last cocaine use 07/12/18  . Sexual activity: Yes  Lifestyle  . Physical activity:    Days per week: Not on file    Minutes per session: Not on file  . Stress: Not on file  Relationships  . Social connections:    Talks on phone: Not on file    Gets together: Not on file    Attends religious service: Not on file    Active member of club or organization: Not on file    Attends meetings of clubs or organizations: Not on file    Relationship status: Not on file  . Intimate partner violence:    Fear of current or ex partner: Not on file    Emotionally abused: Not on file    Physically abused: Not on file    Forced sexual activity: Not on file  Other Topics Concern  . Not on file  Social History Narrative  . Not on file     Review of Systems: See HPI, otherwise negative ROS  Physical Exam: BP (!) 155/85   Pulse 78   Temp 98.2 F (36.8 C) (Oral)   Ht 5\' 8"  (1.727 m)   Wt 145 lb 6.4 oz (66 kg)   BMI 22.11 kg/m  General:   Alert,  W pleasant and cooperative in NAD; accompanied by mother. Neck:  Supple; no masses or thyromegaly. No significant cervical adenopathy. Lungs:  Clear throughout to auscultation.   No wheezes, crackles, or rhonchi. No acute distress. Heart:  Regular rate and rhythm; no murmurs, clicks, rubs,  or gallops. Abdomen: Non-distended, normal bowel sounds.  Soft and nontender without appreciable mass or hepatosplenomegaly.  Pulses:  Normal pulses noted. Extremities:  Without clubbing or edema.  Impression/Plan: 58 year old gentleman with a history of severe reflux esophagitis and significant peptic ulcer disease.  He is here for a surveillance EGD.  He now has esophageal dysphagia.  He would not surprise me if he has a stricture.  No obvious malignancy detected at prior EGD.    Recommendations: I have offered the patient a EGD with esophageal dilation as feasible/appropriate per plan.  The risks, benefits, limitations, alternatives and imponderables have been reviewed with the patient. Potential for esophageal dilation, biopsy, etc. have also been reviewed.  Questions have been answered. All parties agreeable. We will utilize propofol.  Notice: This dictation was prepared with Dragon dictation along with smaller phrase technology. Any transcriptional errors that result from this process are unintentional and may not be corrected upon review.

## 2018-11-16 NOTE — Patient Instructions (Signed)
Schedule an EGD with possible esophageal dilation (dysphagia).  Dysphagia and hx of gastric ulcer.  Propofol  Continue Protonix 40 mg twice daily for now  Further recommendations to follow

## 2018-11-16 NOTE — Progress Notes (Signed)
Primary Care Physician:  Rosita Fire, MD Primary Gastroenterologist:  Dr. [name]  Pre-Procedure History & Physical: HPI:  Evan Chavez is a 58 y.o. male here for follow-up of severe reflux esophagitis and peptic ulcer disease (gastric ulcer).  Hospitalized about 3 months ago.  EGD revealed severe esophagitis and gastric ulcer disease.  Biopsies negative for malignancy H. pylori and fungal elements.  He is refrain from taking illicit drugs.  He is been on Protonix 40 mg twice daily.  He does have vague esophageal dysphagia at this time.  He is due for surveillance EGD.  Past Medical History:  Diagnosis Date  . Cocaine abuse ([place])   . ETOH abuse   . GERD (gastroesophageal reflux disease)   . Hyperlipidemia   . Stroke (cerebrum) ([place]) 06/2018    Past Surgical History:  Procedure Laterality Date  . BIOPSY  08/25/2018   Procedure: BIOPSY;  Surgeon: [name], MD;  [facility];  Service: Endoscopy;;  . ESOPHAGOGASTRODUODENOSCOPY (EGD) WITH PROPOFOL N/A 08/25/2018   Procedure: ESOPHAGOGASTRODUODENOSCOPY (EGD) WITH PROPOFOL;  Surgeon: [name], MD;  [facility];  Service: Endoscopy;  Laterality: N/A;    Prior to Admission medications   Medication Sig Start Date End Date Taking? Authorizing Provider  aspirin 325 MG tablet Take 1 tablet (325 mg total) by mouth daily. 07/16/18  Yes Barton Dubois, MD  diltiazem (CARDIZEM CD) 120 MG 24 hr capsule Take 1 capsule (120 mg total) by mouth daily. 09/22/18  Yes Soyla Dryer, PA-C  Multiple Vitamin (MULTIVITAMIN WITH MINERALS) TABS tablet Take 1 tablet by mouth daily.   Yes [provider]  pantoprazole (PROTONIX) 40 MG tablet Take 1 tablet (40 mg total) by mouth 2 (two) times daily before a meal. 09/22/18  Yes Soyla Dryer, PA-C  sucralfate (CARAFATE) 1 g tablet TAKE 1 TABLET BY MOUTH 4 TIMES A DAY WITH MEALS AND AT BEDTIME 10/20/18  Yes Soyla Dryer, PA-C    Allergies as of 11/16/2018  .  (No Known Allergies)    Family History  Problem Relation Age of Onset  . Hypertension Father   . Heart attack Father   . Heart disease Father   . Hypertension Mother   . GER disease Mother   . Thyroid disease Mother   . Colon cancer Neg Hx   . Colon polyps Neg Hx     Social History   Socioeconomic History  . Marital status: Single    Spouse name: Not on file  . Number of children: Not on file  . Years of education: Not on file  . Highest education level: Not on file  Occupational History  . Not on file  Social Needs  . Financial resource strain: Not on file  . Food insecurity:    Worry: Not on file    Inability: Not on file  . Transportation needs:    Medical: Not on file    Non-medical: Not on file  Tobacco Use  . Smoking status: Former Smoker    Packs/day: 1.00    Years: 40.00    Pack years: 40.00    Types: Cigarettes    Last attempt to quit: 06/2018    Years since quitting: 0.4  . Smokeless tobacco: Never Used  Substance and Sexual Activity  . Alcohol use: Not Currently    Alcohol/week: 12.0 standard drinks    Types: 12 Cans of beer per week    Comment: none since 06/2018  . Drug use:  Not Currently    Types: Cocaine    Comment: last cocaine use 07/12/18  . Sexual activity: Yes  Lifestyle  . Physical activity:    Days per week: Not on file    Minutes per session: Not on file  . Stress: Not on file  Relationships  . Social connections:    Talks on phone: Not on file    Gets together: Not on file    Attends religious service: Not on file    Active member of club or organization: Not on file    Attends meetings of clubs or organizations: Not on file    Relationship status: Not on file  . Intimate partner violence:    Fear of current or ex partner: Not on file    Emotionally abused: Not on file    Physically abused: Not on file    Forced sexual activity: Not on file  Other Topics Concern  . Not on file  Social History Narrative  . Not on file     Review of Systems: See HPI, otherwise negative ROS  Physical Exam: BP (!) 155/85   Pulse 78   Temp 98.2 F (36.8 C) (Oral)   Ht 5\' 8"  (1.727 m)   Wt 145 lb 6.4 oz (66 kg)   BMI 22.11 kg/m  General:   Alert,  W pleasant and cooperative in NAD; accompanied by mother. Neck:  Supple; no masses or thyromegaly. No significant cervical adenopathy. Lungs:  Clear throughout to auscultation.   No wheezes, crackles, or rhonchi. No acute distress. Heart:  Regular rate and rhythm; no murmurs, clicks, rubs,  or gallops. Abdomen: Non-distended, normal bowel sounds.  Soft and nontender without appreciable mass or hepatosplenomegaly.  Pulses:  Normal pulses noted. Extremities:  Without clubbing or edema.  Impression/Plan: 58 year old gentleman with a history of severe reflux esophagitis and significant peptic ulcer disease.  He is here for a surveillance EGD.  He now has esophageal dysphagia.  He would not surprise me if he has a stricture.  No obvious malignancy detected at prior EGD.    Recommendations: I have offered the patient a EGD with esophageal dilation as feasible/appropriate per plan.  The risks, benefits, limitations, alternatives and imponderables have been reviewed with the patient. Potential for esophageal dilation, biopsy, etc. have also been reviewed.  Questions have been answered. All parties agreeable. We will utilize propofol.  Notice: This dictation was prepared with Dragon dictation along with smaller phrase technology. Any transcriptional errors that result from this process are unintentional and may not be corrected upon review.

## 2018-11-16 NOTE — Telephone Encounter (Signed)
Tried to call pt's mother to inform of pre-op appt 12/09/18 at 12:45pm, no answer, LMOAM. Letter mailed.

## 2018-11-26 ENCOUNTER — Other Ambulatory Visit: Payer: Self-pay | Admitting: Physician Assistant

## 2018-12-03 NOTE — Patient Instructions (Signed)
Evan Chavez  12/03/2018     @PREFPERIOPPHARMACY @   Your procedure is scheduled on  12/13/2018.  Report to [facility] at  1245   P.M.  Call this number if you have problems the morning of surgery:  [phone]   Remember:  Follow the diet and prep instructions given to you by Dr Roseanne Kaufman office.                      Take these medicines the morning of surgery with A SIP OF WATER diltiazem, protonix.    Do not wear jewelry, make-up or nail polish.  Do not wear lotions, powders, or perfumes, or deodorant.  Do not shave 48 hours prior to surgery.  Men may shave face and neck.  Do not bring valuables to the hospital.  [facility] is not responsible for any belongings or valuables.  Contacts, dentures or bridgework may not be worn into surgery.  Leave your suitcase in the car.  After surgery it may be brought to your room.  For patients admitted to the hospital, discharge time will be determined by your treatment team.  Patients discharged the day of surgery will not be allowed to drive home.   Name and phone number of your driver:   family Special instructions:   None  Please read over the following fact sheets that you were given. Anesthesia Post-op Instructions and Care and Recovery After Surgery       Upper Endoscopy, Adult, Care After This sheet gives you information about how to care for yourself after your procedure. Your health care provider may also give you more specific instructions. If you have problems or questions, contact your health care provider. What can I expect after the procedure? After the procedure, it is common to have:  A sore throat.  Mild stomach pain or discomfort.  Bloating.  Nausea. Follow these instructions at home:   Follow instructions from your health care provider about what to eat or drink after your procedure.  Return to your normal activities as told by your health care provider. Ask your health care provider what  activities are safe for you.  Take over-the-counter and prescription medicines only as told by your health care provider.  Do not drive for 24 hours if you were given a sedative during your procedure.  Keep all follow-up visits as told by your health care provider. This is important. Contact a health care provider if you have:  A sore throat that lasts longer than one day.  Trouble swallowing. Get help right away if:  You vomit blood or your vomit looks like coffee grounds.  You have: ? A fever. ? Bloody, black, or tarry stools. ? A severe sore throat or you cannot swallow. ? Difficulty breathing. ? Severe pain in your chest or abdomen. Summary  After the procedure, it is common to have a sore throat, mild stomach discomfort, bloating, and nausea.  Do not drive for 24 hours if you were given a sedative during the procedure.  Follow instructions from your health care provider about what to eat or drink after your procedure.  Return to your normal activities as told by your health care provider. This information is not intended to replace advice given to you by your health care provider. Make sure you discuss any questions you have with your health care provider. Document Released: 03/30/2012 Document Revised: 03/01/2018 Document Reviewed: 03/01/2018 Elsevier Interactive Patient Education  2019 [place].  Esophageal Dilatation Esophageal dilatation, also called esophageal dilation, is a procedure to widen or open (dilate) a blocked or narrowed part of the esophagus. The esophagus is the part of the body that moves food and liquid from the mouth to the stomach. You may need this procedure if:  You have a buildup of scar tissue in your esophagus that makes it difficult, painful, or impossible to swallow. This can be caused by gastroesophageal reflux disease (GERD).  You have cancer of the esophagus.  There is a problem with how food moves through your esophagus. In some  cases, you may need this procedure repeated at a later time to dilate the esophagus gradually. Tell a health care provider about:  Any allergies you have.  All medicines you are taking, including vitamins, herbs, eye drops, creams, and over-the-counter medicines.  Any problems you or family members have had with anesthetic medicines.  Any blood disorders you have.  Any surgeries you have had.  Any medical conditions you have.  Any antibiotic medicines you are required to take before dental procedures.  Whether you are pregnant or may be pregnant. What are the risks? Generally, this is a safe procedure. However, problems may occur, including:  Bleeding due to a tear in the lining of the esophagus.  A hole (perforation) in the esophagus. What happens before the procedure?  Follow instructions from your health care provider about eating or drinking restrictions.  Ask your health care provider about changing or stopping your regular medicines. This is especially important if you are taking diabetes medicines or blood thinners.  Plan to have someone take you home from the hospital or clinic.  Plan to have a responsible adult care for you for at least 24 hours after you leave the hospital or clinic. This is important. What happens during the procedure?  You may be given a medicine to help you relax (sedative).  A numbing medicine may be sprayed into the back of your throat, or you may gargle the medicine.  Your health care provider may perform the dilatation using various surgical instruments, such as: ? Simple dilators. This instrument is carefully placed in the esophagus to stretch it. ? Guided wire bougies. This involves using an endoscope to insert a wire into the esophagus. A dilator is passed over this wire to enlarge the esophagus. Then the wire is removed. ? Balloon dilators. An endoscope with a small balloon at the end is inserted into the esophagus. The balloon is  inflated to stretch the esophagus and open it up. The procedure may vary among health care providers and hospitals. What happens after the procedure?  Your blood pressure, heart rate, breathing rate, and blood oxygen level will be monitored until the medicines you were given have worn off.  Your throat may feel slightly sore and numb. This will improve slowly over time.  You will not be allowed to eat or drink until your throat is no longer numb.  When you are able to drink, urinate, and sit on the edge of the bed without nausea or dizziness, you may be able to return home. Follow these instructions at home:  Take over-the-counter and prescription medicines only as told by your health care provider.  Do not drive for 24 hours if you were given a sedative during your procedure.  You should have a responsible adult with you for 24 hours after the procedure.  Follow instructions from your health care provider about any eating or  drinking restrictions.  Do not use any products that contain nicotine or tobacco, such as cigarettes and e-cigarettes. If you need help quitting, ask your health care provider.  Keep all follow-up visits as told by your health care provider. This is important. Get help right away if you:  Have a fever.  Have chest pain.  Have pain that is not relieved by medication.  Have trouble breathing.  Have trouble swallowing.  Vomit blood. Summary  Esophageal dilatation, also called esophageal dilation, is a procedure to widen or open (dilate) a blocked or narrowed part of the esophagus.  Plan to have someone take you home from the hospital or clinic.  For this procedure, a numbing medicine may be sprayed into the back of your throat, or you may gargle the medicine.  Do not drive for 24 hours if you were given a sedative during your procedure. This information is not intended to replace advice given to you by your health care provider. Make sure you discuss  any questions you have with your health care provider. Document Released: 11/20/2005 Document Revised: 08/04/2017 Document Reviewed: 08/04/2017 Elsevier Interactive Patient Education  2019 [place], Care After These instructions provide you with information about caring for yourself after your procedure. Your health care provider may also give you more specific instructions. Your treatment has been planned according to current medical practices, but problems sometimes occur. Call your health care provider if you have any problems or questions after your procedure. What can I expect after the procedure? After your procedure, you may:  Feel sleepy for several hours.  Feel clumsy and have poor balance for several hours.  Feel forgetful about what happened after the procedure.  Have poor judgment for several hours.  Feel nauseous or vomit.  Have a sore throat if you had a breathing tube during the procedure. Follow these instructions at home: For at least 24 hours after the procedure:      Have a responsible adult stay with you. It is important to have someone help care for you until you are awake and alert.  Rest as needed.  Do not: ? Participate in activities in which you could fall or become injured. ? Drive. ? Use heavy machinery. ? Drink alcohol. ? Take sleeping pills or medicines that cause drowsiness. ? Make important decisions or sign legal documents. ? Take care of children on your own. Eating and drinking  Follow the diet that is recommended by your health care provider.  If you vomit, drink water, juice, or soup when you can drink without vomiting.  Make sure you have little or no nausea before eating solid foods. General instructions  Take over-the-counter and prescription medicines only as told by your health care provider.  If you have sleep apnea, surgery and certain medicines can increase your risk for breathing problems.  Follow instructions from your health care provider about wearing your sleep device: ? Anytime you are sleeping, including during daytime naps. ? While taking prescription pain medicines, sleeping medicines, or medicines that make you drowsy.  If you smoke, do not smoke without supervision.  Keep all follow-up visits as told by your health care provider. This is important. Contact a health care provider if:  You keep feeling nauseous or you keep vomiting.  You feel light-headed.  You develop a rash.  You have a fever. Get help right away if:  You have trouble breathing. Summary  For several hours after your procedure, you may feel  sleepy and have poor judgment.  Have a responsible adult stay with you for at least 24 hours or until you are awake and alert. This information is not intended to replace advice given to you by your health care provider. Make sure you discuss any questions you have with your health care provider. Document Released: 01/20/2016 Document Revised: 05/15/2017 Document Reviewed: 01/20/2016 Elsevier Interactive Patient Education  2019 [employer].

## 2018-12-09 ENCOUNTER — Encounter (HOSPITAL_COMMUNITY): Payer: Self-pay

## 2018-12-09 ENCOUNTER — Encounter (HOSPITAL_COMMUNITY)
Admission: RE | Admit: 2018-12-09 | Discharge: 2018-12-09 | Disposition: A | Discharge status: Home / Self Care | Payer: Medicaid Other | Source: Ambulatory Visit | Attending: Internal Medicine | Admitting: Internal Medicine

## 2018-12-09 DIAGNOSIS — Z01812 Encounter for preprocedural laboratory examination: Secondary | ICD-10-CM | POA: Insufficient documentation

## 2018-12-09 LAB — RAPID URINE DRUG SCREEN, HOSP PERFORMED
Amphetamines: NOT DETECTED
BARBITURATES: NOT DETECTED
BENZODIAZEPINES: NOT DETECTED
COCAINE: NOT DETECTED
OPIATES: NOT DETECTED
TETRAHYDROCANNABINOL: NOT DETECTED

## 2018-12-09 LAB — COMPREHENSIVE METABOLIC PANEL
ALBUMIN: 4 g/dL (ref 3.5–5.0)
ALT: 27 U/L (ref 0–44)
ANION GAP: 8 (ref 5–15)
AST: 17 U/L (ref 15–41)
Alkaline Phosphatase: 52 U/L (ref 38–126)
BILIRUBIN TOTAL: 0.4 mg/dL (ref 0.3–1.2)
BUN: 12 mg/dL (ref 6–20)
CHLORIDE: 106 mmol/L (ref 98–111)
CO2: 25 mmol/L (ref 22–32)
Calcium: 9.6 mg/dL (ref 8.9–10.3)
Creatinine, Ser: 0.84 mg/dL (ref 0.61–1.24)
GFR calc Af Amer: >60 mL/min (ref >60)
GFR calc non Af Amer: >60 mL/min (ref >60)
Glucose, Bld: 98 mg/dL (ref 70–99)
Potassium: 3.7 mmol/L (ref 3.5–5.1)
SODIUM: 139 mmol/L (ref 135–145)
Total Protein: 7.7 g/dL (ref 6.5–8.1)

## 2018-12-09 LAB — CBC
HEMATOCRIT: 39.2 % (ref 39.0–52.0)
Hemoglobin: 11.7 g/dL — ABNORMAL LOW (ref 13.0–17.0)
MCH: 25.9 pg — ABNORMAL LOW (ref 26.0–34.0)
MCHC: 29.8 g/dL — AB (ref 30.0–36.0)
MCV: 86.7 fL (ref 80.0–100.0)
NRBC: 0 % (ref 0.0–0.2)
PLATELETS: 237 10*3/uL (ref 150–400)
RBC: 4.52 MIL/uL (ref 4.22–5.81)
RDW: 13.2 % (ref 11.5–15.5)
WBC: 5.1 10*3/uL (ref 4.0–10.5)

## 2018-12-09 NOTE — Progress Notes (Signed)
   12/09/18 1255  OBSTRUCTIVE SLEEP APNEA  Have you ever been diagnosed with sleep apnea through a sleep study? No  Do you snore loudly (loud enough to be heard through closed doors)?  1  Do you often feel tired, fatigued, or sleepy during the daytime (such as falling asleep during driving or talking to someone)? 1  Has anyone observed you stop breathing during your sleep? 0  Do you have, or are you being treated for high blood pressure? 0  BMI more than 35 kg/m2? 0  Age > 50 (1-yes) 1  Neck circumference greater than:Male 16 inches or larger, Male 17inches or larger? 0  Male Gender (Yes=1) 1  Obstructive Sleep Apnea Score 4

## 2018-12-13 ENCOUNTER — Other Ambulatory Visit: Payer: Self-pay

## 2018-12-13 ENCOUNTER — Ambulatory Visit (HOSPITAL_COMMUNITY): Payer: Medicaid Other | Admitting: Anesthesiology

## 2018-12-13 ENCOUNTER — Encounter (HOSPITAL_COMMUNITY)
Admission: RE | Disposition: A | Discharge status: Home / Self Care | Payer: Self-pay | Source: Home / Self Care | Attending: Internal Medicine

## 2018-12-13 ENCOUNTER — Encounter (HOSPITAL_COMMUNITY): Payer: Self-pay | Admitting: *Deleted

## 2018-12-13 ENCOUNTER — Ambulatory Visit (HOSPITAL_COMMUNITY)
Admission: RE | Admit: 2018-12-13 | Discharge: 2018-12-13 | Disposition: A | Discharge status: Home / Self Care | Payer: Medicaid Other | Attending: Internal Medicine | Admitting: Internal Medicine

## 2018-12-13 DIAGNOSIS — K209 Esophagitis, unspecified: Secondary | ICD-10-CM

## 2018-12-13 DIAGNOSIS — Z7982 Long term (current) use of aspirin: Secondary | ICD-10-CM | POA: Diagnosis not present

## 2018-12-13 DIAGNOSIS — E785 Hyperlipidemia, unspecified: Secondary | ICD-10-CM | POA: Insufficient documentation

## 2018-12-13 DIAGNOSIS — K222 Esophageal obstruction: Secondary | ICD-10-CM | POA: Insufficient documentation

## 2018-12-13 DIAGNOSIS — Z8711 Personal history of peptic ulcer disease: Secondary | ICD-10-CM

## 2018-12-13 DIAGNOSIS — Z79899 Other long term (current) drug therapy: Secondary | ICD-10-CM | POA: Diagnosis not present

## 2018-12-13 DIAGNOSIS — K210 Gastro-esophageal reflux disease with esophagitis: Secondary | ICD-10-CM | POA: Diagnosis not present

## 2018-12-13 DIAGNOSIS — R1310 Dysphagia, unspecified: Secondary | ICD-10-CM | POA: Diagnosis not present

## 2018-12-13 DIAGNOSIS — Z09 Encounter for follow-up examination after completed treatment for conditions other than malignant neoplasm: Secondary | ICD-10-CM | POA: Insufficient documentation

## 2018-12-13 DIAGNOSIS — K259 Gastric ulcer, unspecified as acute or chronic, without hemorrhage or perforation: Secondary | ICD-10-CM | POA: Insufficient documentation

## 2018-12-13 DIAGNOSIS — Z8719 Personal history of other diseases of the digestive system: Secondary | ICD-10-CM

## 2018-12-13 DIAGNOSIS — Z8673 Personal history of transient ischemic attack (TIA), and cerebral infarction without residual deficits: Secondary | ICD-10-CM | POA: Diagnosis not present

## 2018-12-13 DIAGNOSIS — R1314 Dysphagia, pharyngoesophageal phase: Secondary | ICD-10-CM | POA: Diagnosis not present

## 2018-12-13 DIAGNOSIS — Z87891 Personal history of nicotine dependence: Secondary | ICD-10-CM | POA: Diagnosis not present

## 2018-12-13 DIAGNOSIS — K2270 Barrett's esophagus without dysplasia: Secondary | ICD-10-CM | POA: Insufficient documentation

## 2018-12-13 DIAGNOSIS — K449 Diaphragmatic hernia without obstruction or gangrene: Secondary | ICD-10-CM | POA: Diagnosis not present

## 2018-12-13 HISTORY — PX: ESOPHAGOGASTRODUODENOSCOPY (EGD) WITH PROPOFOL: SHX5813

## 2018-12-13 HISTORY — PX: BIOPSY: SHX5522

## 2018-12-13 SURGERY — ESOPHAGOGASTRODUODENOSCOPY (EGD) WITH PROPOFOL
Anesthesia: Monitor Anesthesia Care

## 2018-12-13 MED ORDER — CHLORHEXIDINE GLUCONATE CLOTH 2 % EX PADS: 6.0000 | MEDICATED_PAD | Freq: Once | CUTANEOUS | Status: DC

## 2018-12-13 MED ORDER — PROPOFOL 500 MG/50ML IV EMUL
INTRAVENOUS | Status: DC | PRN
  Administered 2018-12-13: 200 ug/kg/min via INTRAVENOUS

## 2018-12-13 MED ORDER — LACTATED RINGERS IV SOLN: INTRAVENOUS | Status: DC

## 2018-12-13 MED ORDER — PROPOFOL 10 MG/ML IV BOLUS
INTRAVENOUS | Status: DC | PRN
  Administered 2018-12-13: 20 mg via INTRAVENOUS
  Administered 2018-12-13: 10 mg via INTRAVENOUS
  Administered 2018-12-13 (×3): 20 mg via INTRAVENOUS
  Administered 2018-12-13: 50 mg via INTRAVENOUS

## 2018-12-13 MED ORDER — HYDROCODONE-ACETAMINOPHEN 7.5-325 MG PO TABS: 1.0000 | ORAL_TABLET | Freq: Once | ORAL | Status: DC | PRN

## 2018-12-13 MED ORDER — LACTATED RINGERS IV SOLN
INTRAVENOUS | Status: DC
  Administered 2018-12-13: 1000 mL via INTRAVENOUS

## 2018-12-13 MED ORDER — MEPERIDINE HCL 100 MG/ML IJ SOLN: 6.2500 mg | INTRAMUSCULAR | Status: DC | PRN

## 2018-12-13 MED ORDER — PROMETHAZINE HCL 25 MG/ML IJ SOLN: 6.2500 mg | INTRAMUSCULAR | Status: DC | PRN

## 2018-12-13 MED ORDER — HYDROMORPHONE HCL 1 MG/ML IJ SOLN: 0.2500 mg | INTRAMUSCULAR | Status: DC | PRN

## 2018-12-13 MED ORDER — PROPOFOL 10 MG/ML IV BOLUS
INTRAVENOUS | Status: AC
  Filled 2018-12-13: qty 40

## 2018-12-13 NOTE — Discharge Instructions (Signed)
Gastroesophageal Reflux Disease, Adult Gastroesophageal reflux (GER) happens when acid from the stomach flows up into the tube that connects the mouth and the stomach (esophagus). Normally, food travels down the esophagus and stays in the stomach to be digested. However, when a person has GER, food and stomach acid sometimes move back up into the esophagus. If this becomes a more serious problem, the person may be diagnosed with a disease called gastroesophageal reflux disease (GERD). GERD occurs when the reflux:  Happens often.  Causes frequent or severe symptoms.  Causes problems such as damage to the esophagus. When stomach acid comes in contact with the esophagus, the acid may cause soreness (inflammation) in the esophagus. Over time, GERD may create small holes (ulcers) in the lining of the esophagus. What are the causes? This condition is caused by a problem with the muscle between the esophagus and the stomach (lower esophageal sphincter, or LES). Normally, the LES muscle closes after food passes through the esophagus to the stomach. When the LES is weakened or abnormal, it does not close properly, and that allows food and stomach acid to go back up into the esophagus. The LES can be weakened by certain dietary substances, medicines, and medical conditions, including:  Tobacco use.  Pregnancy.  Having a hiatal hernia.  Alcohol use.  Certain foods and beverages, such as coffee, chocolate, onions, and peppermint. What increases the risk? You are more likely to develop this condition if you:  Have an increased body weight.  Have a connective tissue disorder.  Use NSAID medicines. What are the signs or symptoms? Symptoms of this condition include:  Heartburn.  Difficult or painful swallowing.  The feeling of having a lump in the throat.  Abitter taste in the mouth.  Bad breath.  Having a large amount of saliva.  Having an upset or bloated  stomach.  Belching.  Chest pain. Different conditions can cause chest pain. Make sure you see your health care provider if you experience chest pain.  Shortness of breath or wheezing.  Ongoing (chronic) cough or a night-time cough.  Wearing away of tooth enamel.  Weight loss. How is this diagnosed? Your health care provider will take a medical history and perform a physical exam. To determine if you have mild or severe GERD, your health care provider may also monitor how you respond to treatment. You may also have tests, including:  A test to examine your stomach and esophagus with a small camera (endoscopy).  A test thatmeasures the acidity level in your esophagus.  A test thatmeasures how much pressure is on your esophagus.  A barium swallow or modified barium swallow test to show the shape, size, and functioning of your esophagus. How is this treated? The goal of treatment is to help relieve your symptoms and to prevent complications. Treatment for this condition may vary depending on how severe your symptoms are. Your health care provider may recommend:  Changes to your diet.  Medicine.  Surgery. Follow these instructions at home: Eating and drinking   Follow a diet as recommended by your health care provider. This may involve avoiding foods and drinks such as: ? Coffee and tea (with or without caffeine). ? Drinks that containalcohol. ? Energy drinks and sports drinks. ? Carbonated drinks or sodas. ? Chocolate and cocoa. ? Peppermint and mint flavorings. ? Garlic and onions. ? Horseradish. ? Spicy and acidic foods, including peppers, chili powder, curry powder, vinegar, hot sauces, and barbecue sauce. ? Citrus fruit juices and  citrus fruits, such as oranges, lemons, and limes. ? Tomato-based foods, such as red sauce, chili, salsa, and pizza with red sauce. ? Fried and fatty foods, such as donuts, french fries, potato chips, and high-fat dressings. ? High-fat  meats, such as hot dogs and fatty cuts of red and white meats, such as rib eye steak, sausage, ham, and bacon. ? High-fat dairy items, such as whole milk, butter, and cream cheese.  Eat small, frequent meals instead of large meals.  Avoid drinking large amounts of liquid with your meals.  Avoid eating meals during the 2-3 hours before bedtime.  Avoid lying down right after you eat.  Do not exercise right after you eat. Lifestyle   Do not use any products that contain nicotine or tobacco, such as cigarettes, e-cigarettes, and chewing tobacco. If you need help quitting, ask your health care provider.  Try to reduce your stress by using methods such as yoga or meditation. If you need help reducing stress, ask your health care provider.  If you are overweight, reduce your weight to an amount that is healthy for you. Ask your health care provider for guidance about a safe weight loss goal. General instructions  Pay attention to any changes in your symptoms.  Take over-the-counter and prescription medicines only as told by your health care provider. Do not take aspirin, ibuprofen, or other NSAIDs unless your health care provider told you to do so.  Wear loose-fitting clothing. Do not wear anything tight around your waist that causes pressure on your abdomen.  Raise (elevate) the head of your bed about 6 inches (15 cm).  Avoid bending over if this makes your symptoms worse.  Keep all follow-up visits as told by your health care provider. This is important. Contact a health care provider if:  You have: ? New symptoms. ? Unexplained weight loss. ? Difficulty swallowing or it hurts to swallow. ? Wheezing or a persistent cough. ? A hoarse voice.  Your symptoms do not improve with treatment. Get help right away if you:  Have pain in your arms, neck, jaw, teeth, or back.  Feel sweaty, dizzy, or light-headed.  Have chest pain or shortness of breath.  Vomit and your vomit looks  like blood or coffee grounds.  Faint.  Have stool that is bloody or black.  Cannot swallow, drink, or eat. Summary  Gastroesophageal reflux happens when acid from the stomach flows up into the esophagus. GERD is a disease in which the reflux happens often, causes frequent or severe symptoms, or causes problems such as damage to the esophagus.  Treatment for this condition may vary depending on how severe your symptoms are. Your health care provider may recommend diet and lifestyle changes, medicine, or surgery.  Contact a health care provider if you have new or worsening symptoms.  Take over-the-counter and prescription medicines only as told by your health care provider. Do not take aspirin, ibuprofen, or other NSAIDs unless your health care provider told you to do so.  Keep all follow-up visits as told by your health care provider. This is important. This information is not intended to replace advice given to you by your health care provider. Make sure you discuss any questions you have with your health care provider. Document Released: 07/09/2005 Document Revised: 04/07/2018 Document Reviewed: 04/07/2018 Elsevier Interactive Patient Education  2019 [place]. EGD Discharge instructions Please read the instructions outlined below and refer to this sheet in the next few weeks. These discharge instructions provide you with  general information on caring for yourself after you leave the hospital. Your doctor may also give you specific instructions. While your treatment has been planned according to the most current medical practices available, unavoidable complications occasionally occur. If you have any problems or questions after discharge, please call your doctor. ACTIVITY  You may resume your regular activity but move at a slower pace for the next 24 hours.   Take frequent rest periods for the next 24 hours.   Walking will help expel (get rid of) the air and reduce the bloated  feeling in your abdomen.   No driving for 24 hours (because of the anesthesia (medicine) used during the test).   You may shower.   Do not sign any important legal documents or operate any machinery for 24 hours (because of the anesthesia used during the test).  NUTRITION  Drink plenty of fluids.   You may resume your normal diet.   Begin with a light meal and progress to your normal diet.   Avoid alcoholic beverages for 24 hours or as instructed by your caregiver.  MEDICATIONS  You may resume your normal medications unless your caregiver tells you otherwise.  WHAT YOU CAN EXPECT TODAY  You may experience abdominal discomfort such as a feeling of fullness or gas pains.  FOLLOW-UP  Your doctor will discuss the results of your test with you.  SEEK IMMEDIATE MEDICAL ATTENTION IF ANY OF THE FOLLOWING OCCUR:  Excessive nausea (feeling sick to your stomach) and/or vomiting.   Severe abdominal pain and distention (swelling).   Trouble swallowing.   Temperature over 101 F (37.8 C).   Rectal bleeding or vomiting of blood.     GERD information provided  Avoid nonsteroidal agents including aspirin and Advil  Continue Protonix 40 mg twice daily  Office visit with [place] in 6 months  Further recommendations to follow pending review of pathology report

## 2018-12-13 NOTE — Op Note (Signed)
[facility] Patient Name: Evan Chavez Procedure Date: 12/13/2018 2:52 PM MRN: [id number] Date of Birth: 1961-05-23 Attending MD: Norvel Richards , MD CSN: [id number] Age: 58 Admit Type: Outpatient Procedure:                Upper GI endoscopy Indications:              Surveillance procedure, Dysphagia Providers:                Norvel Richards, MD, Jeanann Lewandowsky. Sharon Seller, RN,                            Aram Candela Referring MD:              Medicines:                Propofol per Anesthesia Complications:            No immediate complications. Estimated Blood Loss:     Estimated blood loss was minimal. Procedure:                Pre-Anesthesia Assessment:                           - Prior to the procedure, a History and Physical                            was performed, and patient medications and                            allergies were reviewed. The patient's tolerance of                            previous anesthesia was also reviewed. The risks                            and benefits of the procedure and the sedation                            options and risks were discussed with the patient.                            All questions were answered, and informed consent                            was obtained. Prior Anticoagulants: The patient has                            taken no previous anticoagulant or antiplatelet                            agents. ASA Grade Assessment: III - A patient with                            severe systemic disease. After reviewing the risks  and benefits, the patient was deemed in                            satisfactory condition to undergo the procedure.                           After obtaining informed consent, the endoscope was                            passed under direct vision. Throughout the                            procedure, the patient's blood pressure, pulse, and                            oxygen  saturations were monitored continuously. The                            [id number]-H190 ([id number]) scope was introduced through the                            and advanced to the second part of duodenum. The                            upper GI endoscopy was accomplished without                            difficulty. The patient tolerated the procedure                            well. Scope In: 3:20:47 PM Scope Out: 3:36:21 PM Total Procedure Duration: 0 hours 15 minutes 34 seconds  Findings:      Peptic-appearing appearing stricture at 30 cm from the incisors. GE       junction identified at 36 cm from the incisors. Intervening 5 cm segment       -abnormal appearing salmon-colored epithelium. No tumor seen. Moderate       size hiatal hernia. Patchy erythema of the antrum and bulb; previously       noted ulcers completely healed. TTS balloon dilation performed of peptic       stricture. Balloon inflated to 18 mm x 1 minute taken down. This was       associated with successful stricture treatment without apparent       complication. Minimal bleeding. No apparent complication. We biopsies at       30 30-36 cm taken Impression:               -Peptic stricture status post balloon dilation.                            Abnormal esophageal mucosa suspicious for Barrett's                            esophagus biopsied                           -  Hiatal hernia. Deviously noted peptic ulcer                            disease completely healed. Moderate Sedation:      Moderate (conscious) sedation was personally administered by an       anesthesia professional. The following parameters were monitored: oxygen       saturation, heart rate, blood pressure, respiratory rate, EKG, adequacy       of pulmonary ventilation, and response to care. Recommendation:           - Patient has a contact number available for                            emergencies. The signs and symptoms of potential                             delayed complications were discussed with the                            patient. Return to normal activities tomorrow.                            Written discharge instructions were provided to the                            patient.                           - Advance diet as tolerated.                           - Continue present medications.                           - Repeat upper endoscopy in 3 months for                            retreatment.                           - Return to GI clinic in 3 months. Procedure Code(s):        --- Professional ---                           [phone], Esophagogastroduodenoscopy, flexible,                            transoral; diagnostic, including collection of                            specimen(s) by brushing or washing, when performed                            (separate procedure) Diagnosis Code(s):        --- Professional ---  K20.9, Esophagitis, unspecified                           R13.10, Dysphagia, unspecified CPT copyright 2018 American Medical Association. All rights reserved. The codes documented in this report are preliminary and upon coder review may  be revised to meet current compliance requirements. [name]. Anterrio Mccleery, MD Norvel Richards, MD 12/13/2018 4:10:40 PM This report has been signed electronically. Number of Addenda: 0

## 2018-12-13 NOTE — Interval H&P Note (Signed)
History and Physical Interval Note:  12/13/2018 2:57 PM  Evan Chavez  has presented today for surgery, with the diagnosis of dysphagia, history of gastric ulcer  The various methods of treatment have been discussed with the patient and family. After consideration of risks, benefits and other options for treatment, the patient has consented to  Procedure(s) with comments: ESOPHAGOGASTRODUODENOSCOPY (EGD) WITH PROPOFOL (N/A) - 2:15pm MALONEY DILATION (N/A) as a surgical intervention .  The patient's history has been reviewed, patient examined, no change in status, stable for surgery.  I have reviewed the patient's chart and labs.  Questions were answered to the patient's satisfaction.     Durga Saldarriaga    No change.  EGD with possible ED per plan.    The risks, benefits, limitations, alternatives and imponderables have been reviewed with the patient. Potential for esophageal dilation, biopsy, etc. have also been reviewed.  Questions have been answered. All parties agreeable.

## 2018-12-13 NOTE — Transfer of Care (Signed)
Immediate Anesthesia Transfer of Care Note  Patient: Evan Chavez  Procedure(s) Performed: ESOPHAGOGASTRODUODENOSCOPY (EGD) WITH PROPOFOL (N/A ) BIOPSY  Patient Location: PACU  Anesthesia Type:MAC  Level of Consciousness: awake, alert  and oriented  Airway & Oxygen Therapy: Patient Spontanous Breathing  Post-op Assessment: Report given to RN  Post vital signs: Reviewed and stable  Last Vitals:  Vitals Value Taken Time  BP 126/89 12/13/2018  3:45 PM  Temp    Pulse 85 12/13/2018  3:48 PM  Resp 18 12/13/2018  3:48 PM  SpO2 100 % 12/13/2018  3:48 PM  Vitals shown include unvalidated device data.  Last Pain:  Vitals:   12/13/18 1516  TempSrc:   PainSc: 0-No pain      Patients Stated Pain Goal: 4 (14/97/02 6378)  Complications: No apparent anesthesia complications

## 2018-12-13 NOTE — Anesthesia Preprocedure Evaluation (Signed)
Anesthesia Evaluation    Airway Mallampati: II       Dental  (+) Poor Dentition, Dental Advidsory Given   Pulmonary pneumonia, former smoker,     + decreased breath sounds      Cardiovascular hypertension,  Rhythm:regular     Neuro/Psych PSYCHIATRIC DISORDERS CVA, Residual Symptoms    GI/Hepatic GERD  ,  Endo/Other    Renal/GU      Musculoskeletal   Abdominal   Peds  Hematology   Anesthesia Other Findings Ischemic CVA H/O cocaine and tobacaao abuse  Reproductive/Obstetrics                             Anesthesia Physical Anesthesia Plan  ASA: IV  Anesthesia Plan: MAC   Post-op Pain Management:    Induction:   PONV Risk Score and Plan:   Airway Management Planned:   Additional Equipment:   Intra-op Plan:   Post-operative Plan:   Informed Consent: I have reviewed the patients History and Physical, chart, labs and discussed the procedure including the risks, benefits and alternatives for the proposed anesthesia with the patient or authorized representative who has indicated his/her understanding and acceptance.       Plan Discussed with: Anesthesiologist  Anesthesia Plan Comments:         Anesthesia Quick Evaluation

## 2018-12-13 NOTE — Anesthesia Postprocedure Evaluation (Signed)
Anesthesia Post Note  Patient: Evan Chavez  Procedure(s) Performed: ESOPHAGOGASTRODUODENOSCOPY (EGD) WITH PROPOFOL (N/A ) BIOPSY  Patient location during evaluation: PACU Anesthesia Type: MAC Level of consciousness: awake and alert and oriented Pain management: pain level controlled Vital Signs Assessment: post-procedure vital signs reviewed and stable Respiratory status: spontaneous breathing Cardiovascular status: stable Postop Assessment: no apparent nausea or vomiting Anesthetic complications: no     Last Vitals:  Vitals:   12/13/18 1542 12/13/18 1545  BP: 127/90 126/89  Pulse: 83 81  Resp: 18 18  Temp: (P) 37 C   SpO2:      Last Pain:  Vitals:   12/13/18 1516  TempSrc:   PainSc: 0-No pain                 Brayten Komar

## 2018-12-14 NOTE — Addendum Note (Signed)
Addendum  created 12/14/18 0754 by Ollen Bowl, CRNA   Charge Capture section accepted

## 2018-12-16 ENCOUNTER — Encounter (HOSPITAL_COMMUNITY): Payer: Self-pay | Admitting: Internal Medicine

## 2018-12-26 ENCOUNTER — Encounter: Payer: Self-pay | Admitting: Internal Medicine

## 2018-12-27 ENCOUNTER — Telehealth: Payer: Self-pay

## 2018-12-27 NOTE — Telephone Encounter (Signed)
Per RMR- Send letter to patient.  Send copy of letter with path to referring provider and PCP.   Need ov in 3 mos to set up repeat EGD

## 2018-12-28 ENCOUNTER — Encounter: Payer: Self-pay | Admitting: Internal Medicine

## 2018-12-28 NOTE — Telephone Encounter (Signed)
OV made and letter mailed °

## 2019-03-29 NOTE — Progress Notes (Signed)
Referring Provider: Rosita Fire, MD Primary Care Physician:  Rosita Fire, MD  Primary Gastroenterologist: Dr. [name]  Chief Complaint  Patient presents with  . Follow-up    Needs EGD,Throwing up    HPI:   Evan Chavez is a 58 y.o. male presenting today to schedule a repeat EGD. Patient has history of severe reflux esophagitis and PUD. Hospitalized in 08/2018 with EGD at that time showing severe esophagitis, extensive gastric erosions, and duodenal ulceration. Biopsies reveal Barrett's esophagus but negative for malignancy and H. pylori. Last seen in our office on 11/16/18 and was having dysphagia. He was due for repeat EGD at that time.   EGD 12/13/2018: Peptic stricture s/p balloon dilation. Abnormal esophageal mucosa biopsied, completed healed previously noted PUD, and hiatal hernia. Biopsies with Barrett's esophagus without dysplasia or malignancy. Recommended repeat EGD in 3 months for retreatment.   Pre-procedure labs on 12/09/18 significant for Hemoglobin 11.7. Anemia was present in November 2019 while hospitalized, hemoglobin overall stable from admission in November 2019 and slightly improved from last recording of 9.7 on 11/26. MCH and MCHC now slightly below normal with MCH 25.9 and MCHC 29.8.   Today he states he has been doing well. No dysphagia at this time. Vomiting started a couple months ago. Ocurrs 1x every 2-3 weeks in the middle of the night. Only one episode of vomiting at a time. Appearance is brown. No associated nausea. Reflux symptoms daily but intermittent, feels like burning in throat, only lasting a few minutes, typically after he eats. No abdominal pain. BMs daily. No constipation or diarrhea. No hematochezia, no melena. Appetite is good. No early satiety. No unintentional weight loss. No NSAIDs. Patient has never had a colonoscopy.  Patient is taking reglan. Not sure who started it or why he is on it. Been taking for about 1 month. Thinks it may have helped  with his vomiting, but not sure. Of note, it is hard to get a good history from patient.       Past Medical History:  Diagnosis Date  . Cocaine abuse ([place])   . ETOH abuse   . GERD (gastroesophageal reflux disease)   . Hyperlipidemia   . Stroke (cerebrum) ([place]) 06/2018   right sided weakness    Past Surgical History:  Procedure Laterality Date  . BIOPSY  08/25/2018   Procedure: BIOPSY;  Surgeon: [name], MD;  [facility];  Service: Endoscopy;;  . BIOPSY  12/13/2018   Procedure: BIOPSY;  Surgeon: [name], MD;  [facility];  Service: Endoscopy;;  esophagus  . ESOPHAGOGASTRODUODENOSCOPY (EGD) WITH PROPOFOL N/A 08/25/2018   Procedure: ESOPHAGOGASTRODUODENOSCOPY (EGD) WITH PROPOFOL;  Surgeon: [name], MD;  [facility];  Service: Endoscopy;  Laterality: N/A;  . ESOPHAGOGASTRODUODENOSCOPY (EGD) WITH PROPOFOL N/A 12/13/2018   Procedure: ESOPHAGOGASTRODUODENOSCOPY (EGD) WITH PROPOFOL;  Surgeon: [name], MD;  [facility];  Service: Endoscopy;  Laterality: N/A;  2:15pm    Current Outpatient Medications  Medication Sig Dispense Refill  . acetaminophen (TYLENOL) 500 MG tablet Take 1,000 mg by mouth every 6 (six) hours as needed (for pain).    Marland Kitchen aspirin 325 MG tablet Take 1 tablet (325 mg total) by mouth daily. (Patient taking differently: Take 325 mg by mouth daily after breakfast. )    . diltiazem (CARDIZEM CD) 120 MG 24 hr capsule Take 1 capsule (120 mg total) by mouth daily. (Patient taking differently: Take 120 mg by mouth daily  after breakfast. ) 30 capsule 1  . Multiple Vitamin (MULTIVITAMIN WITH MINERALS) TABS tablet Take 1 tablet by mouth daily after breakfast.     . pantoprazole (PROTONIX) 40 MG tablet Take 1 tablet (40 mg total) by mouth 2 (two) times daily before a meal. 60 tablet 1  . sucralfate (CARAFATE) 1 g tablet TAKE 1 TABLET BY MOUTH 4 TIMES A DAY WITH MEALS AND AT BEDTIME 120 tablet 0   No current  facility-administered medications for this visit.     Allergies as of 03/30/2019  . (No Known Allergies)    Family History  Problem Relation Age of Onset  . Hypertension Father   . Heart attack Father   . Heart disease Father   . Hypertension Mother   . GER disease Mother   . Thyroid disease Mother   . Colon cancer Neg Hx   . Colon polyps Neg Hx     Social History   Socioeconomic History  . Marital status: Single    Spouse name: Not on file  . Number of children: Not on file  . Years of education: Not on file  . Highest education level: Not on file  Occupational History  . Not on file  Social Needs  . Financial resource strain: Not on file  . Food insecurity    Worry: Not on file    Inability: Not on file  . Transportation needs    Medical: Not on file    Non-medical: Not on file  Tobacco Use  . Smoking status: Former Smoker    Packs/day: 1.00    Years: 40.00    Pack years: 40.00    Types: Cigarettes    Quit date: 06/2018    Years since quitting: 0.7  . Smokeless tobacco: Never Used  Substance and Sexual Activity  . Alcohol use: Not Currently    Alcohol/week: 12.0 standard drinks    Types: 12 Cans of beer per week    Comment: none since 06/2018  . Drug use: Not Currently    Types: Cocaine    Comment: last cocaine use 07/12/18  . Sexual activity: Yes  Lifestyle  . Physical activity    Days per week: Not on file    Minutes per session: Not on file  . Stress: Not on file  Relationships  . Social Herbalist on phone: Not on file    Gets together: Not on file    Attends religious service: Not on file    Active member of club or organization: Not on file    Attends meetings of clubs or organizations: Not on file    Relationship status: Not on file  Other Topics Concern  . Not on file  Social History Narrative  . Not on file    Review of Systems: Gen: Denies fever, chills, anorexia. Denies fatigue, weakness, weight loss.  CV: Denies chest  pain, palpitations, syncope, peripheral edema, and claudication. Resp: Denies dyspnea at rest, admits to slight chronic cough. GI: Denies vomiting blood, jaundice, and fecal incontinence.   Denies dysphagia or odynophagia. Derm: Denies rash, itching, dry skin Psych: Denies depression, anxiety, memory loss, confusion. No homicidal or suicidal ideation.  Heme: Denies bruising, bleeding, and enlarged lymph nodes.  Physical Exam: BP (!) 144/85   Pulse 81   Temp 98 F (36.7 C)   Ht 5\' 8"  (1.727 m)   Wt 154 lb 3.2 oz (69.9 kg)   BMI 23.45 kg/m  General:   Alert  and oriented. No distress noted. Pleasant and cooperative.  Head:  Normocephalic and atraumatic. Eyes:  Conjuctiva clear without scleral icterus. Mouth:  Oral mucosa pink and moist. Good dentition. No lesions. Heart:  S1, S2 present without murmurs appreciated. Lungs:  Clear to auscultation bilaterally. No wheezes, rales, or rhonchi. No distress.  Abdomen:  +BS, soft, non-tender and non-distended. No rebound or guarding. No HSM or masses noted. Msk:  Symmetrical without gross deformities. Normal posture. Extremities:  Without edema. Neurologic:  Alert and  oriented x4 Psych:  Alert and cooperative. Normal mood and affect.

## 2019-03-29 NOTE — H&P (View-Only) (Signed)
Referring Provider: Rosita Fire, MD Primary Care Physician:  Rosita Fire, MD  Primary Gastroenterologist: Dr. [name]  Chief Complaint  Patient presents with  . Follow-up    Needs EGD,Throwing up    HPI:   Evan Chavez is a 58 y.o. male presenting today to schedule a repeat EGD. Patient has history of severe reflux esophagitis and PUD. Hospitalized in 08/2018 with EGD at that time showing severe esophagitis, extensive gastric erosions, and duodenal ulceration. Biopsies reveal Barrett's esophagus but negative for malignancy and H. pylori. Last seen in our office on 11/16/18 and was having dysphagia. He was due for repeat EGD at that time.   EGD 12/13/2018: Peptic stricture s/p balloon dilation. Abnormal esophageal mucosa biopsied, completed healed previously noted PUD, and hiatal hernia. Biopsies with Barrett's esophagus without dysplasia or malignancy. Recommended repeat EGD in 3 months for retreatment.   Pre-procedure labs on 12/09/18 significant for Hemoglobin 11.7. Anemia was present in November 2019 while hospitalized, hemoglobin overall stable from admission in November 2019 and slightly improved from last recording of 9.7 on 11/26. MCH and MCHC now slightly below normal with MCH 25.9 and MCHC 29.8.   Today he states he has been doing well. No dysphagia at this time. Vomiting started a couple months ago. Ocurrs 1x every 2-3 weeks in the middle of the night. Only one episode of vomiting at a time. Appearance is brown. No associated nausea. Reflux symptoms daily but intermittent, feels like burning in throat, only lasting a few minutes, typically after he eats. No abdominal pain. BMs daily. No constipation or diarrhea. No hematochezia, no melena. Appetite is good. No early satiety. No unintentional weight loss. No NSAIDs. Patient has never had a colonoscopy.  Patient is taking reglan. Not sure who started it or why he is on it. Been taking for about 1 month. Thinks it may have helped  with his vomiting, but not sure. Of note, it is hard to get a good history from patient.       Past Medical History:  Diagnosis Date  . Cocaine abuse ([place])   . ETOH abuse   . GERD (gastroesophageal reflux disease)   . Hyperlipidemia   . 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 BIOPSY  08/25/2018   Procedure: BIOPSY;  Surgeon: [name], MD;  [facility];  Service: Endoscopy;;  . BIOPSY  12/13/2018   Procedure: BIOPSY;  Surgeon: [name], MD;  [facility];  Service: Endoscopy;;  esophagus  . ESOPHAGOGASTRODUODENOSCOPY (EGD) WITH PROPOFOL N/A 08/25/2018   Procedure: ESOPHAGOGASTRODUODENOSCOPY (EGD) WITH PROPOFOL;  Surgeon: [name], MD;  [facility];  Service: Endoscopy;  Laterality: N/A;  . ESOPHAGOGASTRODUODENOSCOPY (EGD) WITH PROPOFOL N/A 12/13/2018   Procedure: ESOPHAGOGASTRODUODENOSCOPY (EGD) WITH PROPOFOL;  Surgeon: [name], MD;  [facility];  Service: Endoscopy;  Laterality: N/A;  2:15pm    Current Outpatient Medications  Medication Sig Dispense Refill  . acetaminophen (TYLENOL) 500 MG tablet Take 1,000 mg by mouth every 6 (six) hours as needed (for pain).    Marland Kitchen aspirin 325 MG tablet Take 1 tablet (325 mg total) by mouth daily. (Patient taking differently: Take 325 mg by mouth daily after breakfast. )    . diltiazem (CARDIZEM CD) 120 MG 24 hr capsule Take 1 capsule (120 mg total) by mouth daily. (Patient taking differently: Take 120 mg by mouth daily  after breakfast. ) 30 capsule 1  . Multiple Vitamin (MULTIVITAMIN WITH MINERALS) TABS tablet Take 1 tablet by mouth daily after breakfast.     . pantoprazole (PROTONIX) 40 MG tablet Take 1 tablet (40 mg total) by mouth 2 (two) times daily before a meal. 60 tablet 1  . sucralfate (CARAFATE) 1 g tablet TAKE 1 TABLET BY MOUTH 4 TIMES A DAY WITH MEALS AND AT BEDTIME 120 tablet 0   No current  facility-administered medications for this visit.     Allergies as of 03/30/2019  . (No Known Allergies)    Family History  Problem Relation Age of Onset  . Hypertension Father   . Heart attack Father   . Heart disease Father   . Hypertension Mother   . GER disease Mother   . Thyroid disease Mother   . Colon cancer Neg Hx   . Colon polyps Neg Hx     Social History   Socioeconomic History  . Marital status: Single    Spouse name: Not on file  . Number of children: Not on file  . Years of education: Not on file  . Highest education level: Not on file  Occupational History  . Not on file  Social Needs  . Financial resource strain: Not on file  . Food insecurity    Worry: Not on file    Inability: Not on file  . Transportation needs    Medical: Not on file    Non-medical: Not on file  Tobacco Use  . Smoking status: Former Smoker    Packs/day: 1.00    Years: 40.00    Pack years: 40.00    Types: Cigarettes    Quit date: 06/2018    Years since quitting: 0.7  . Smokeless tobacco: Never Used  Substance and Sexual Activity  . Alcohol use: Not Currently    Alcohol/week: 12.0 standard drinks    Types: 12 Cans of beer per week    Comment: none since 06/2018  . Drug use: Not Currently    Types: Cocaine    Comment: last cocaine use 07/12/18  . Sexual activity: Yes  Lifestyle  . Physical activity    Days per week: Not on file    Minutes per session: Not on file  . Stress: Not on file  Relationships  . Social Herbalist on phone: Not on file    Gets together: Not on file    Attends religious service: Not on file    Active member of club or organization: Not on file    Attends meetings of clubs or organizations: Not on file    Relationship status: Not on file  Other Topics Concern  . Not on file  Social History Narrative  . Not on file    Review of Systems: Gen: Denies fever, chills, anorexia. Denies fatigue, weakness, weight loss.  CV: Denies chest  pain, palpitations, syncope, peripheral edema, and claudication. Resp: Denies dyspnea at rest, admits to slight chronic cough. GI: Denies vomiting blood, jaundice, and fecal incontinence.   Denies dysphagia or odynophagia. Derm: Denies rash, itching, dry skin Psych: Denies depression, anxiety, memory loss, confusion. No homicidal or suicidal ideation.  Heme: Denies bruising, bleeding, and enlarged lymph nodes.  Physical Exam: BP (!) 144/85   Pulse 81   Temp 98 F (36.7 C)   Ht 5\' 8"  (1.727 m)   Wt 154 lb 3.2 oz (69.9 kg)   BMI 23.45 kg/m  General:   Alert  and oriented. No distress noted. Pleasant and cooperative.  Head:  Normocephalic and atraumatic. Eyes:  Conjuctiva clear without scleral icterus. Mouth:  Oral mucosa pink and moist. Good dentition. No lesions. Heart:  S1, S2 present without murmurs appreciated. Lungs:  Clear to auscultation bilaterally. No wheezes, rales, or rhonchi. No distress.  Abdomen:  +BS, soft, non-tender and non-distended. No rebound or guarding. No HSM or masses noted. Msk:  Symmetrical without gross deformities. Normal posture. Extremities:  Without edema. Neurologic:  Alert and  oriented x4 Psych:  Alert and cooperative. Normal mood and affect.

## 2019-03-30 ENCOUNTER — Encounter: Payer: Self-pay | Admitting: Gastroenterology

## 2019-03-30 ENCOUNTER — Ambulatory Visit: Payer: Medicaid Other | Admitting: Gastroenterology

## 2019-03-30 ENCOUNTER — Encounter: Payer: Self-pay | Admitting: *Deleted

## 2019-03-30 ENCOUNTER — Other Ambulatory Visit: Payer: Self-pay | Admitting: *Deleted

## 2019-03-30 ENCOUNTER — Other Ambulatory Visit: Payer: Self-pay

## 2019-03-30 VITALS — BP 144/85 | HR 81 | Temp 98.0°F | Ht 68.0 in | Wt 154.2 lb

## 2019-03-30 DIAGNOSIS — D649 Anemia, unspecified: Secondary | ICD-10-CM

## 2019-03-30 DIAGNOSIS — K222 Esophageal obstruction: Secondary | ICD-10-CM

## 2019-03-30 DIAGNOSIS — K279 Peptic ulcer, site unspecified, unspecified as acute or chronic, without hemorrhage or perforation: Secondary | ICD-10-CM

## 2019-03-30 DIAGNOSIS — K2100 Gastro-esophageal reflux disease with esophagitis, without bleeding: Secondary | ICD-10-CM

## 2019-03-30 NOTE — Progress Notes (Signed)
CC'D TO PCP °

## 2019-03-30 NOTE — Assessment & Plan Note (Addendum)
Chronic. More controlled on Protonix than it had been. Still with short lived burning in his throat typically after eating. Vomiting started a few months ago, occurring in the middle of the night once every 2-3 weeks. No associated nausea. Vomit brown in color, no blood or coffee ground appearance. Patient prescribed Reglan about 1 month ago, but not sure why or who prescribed this. No significant improvement in GERD or vomiting with this. I suspect intermittent night time vomiting is related to GERD. Patient is due to repeat EGD at the time due to history of peptic stricture in March 2020.   Proceed with EGD with Dr. [name] with propofol in the near future. The risks, benefits, and alternatives have been discussed in detail with patient. They have stated understanding and desire to proceed.  Continue Protonix 40mg  twice daily 30 minutes before meals. Will have further recommendations following EGD. May consider changing PPI therapy pending EGD results.  Be sure not to eat 3 hours prior to laying down at night and try eating smaller meals in the evening.  Continue to avoid NSAIDs  Addendum:  Per Dr. [name], will hold off on EGD at this time and pursue colonoscopy as he has IDA and has never had a colonoscopy. We will revisit the need for EGD after colonoscopy.   Spoke with patient and explained the reasoning for changing his procedure from EGD to colonoscopy. Risks and benefits of procedure were discussed. Patient stated his understanding and agrees to move forward with this. We will consider pursuing EGD in the future. Also reinforced GERD diet and lifestyle modifications, avoiding fried fatty foods, avoiding caffinated beverages, eating smaller meals for dinner, ensuring he stays upright while eating, not eating within 3 hours of going to bed, and trying to elevate the head of his bed at night.

## 2019-03-30 NOTE — Patient Instructions (Addendum)
1. Please have your labs completed today at [facility].   2. We will schedule you for a upper endoscopy in the near future with Dr. [name]. We will discuss scheduling you for a colonoscopy at your next visit due to you needing your upper endoscopy soon.   3. Continue taking your Protonix twice daily at least 30 minutes before meals. Will have recommendations for medication changes after EGD.   4. Be sure not to eat within 3 hours of going to bed. Try to have smaller meals in the evening.   5. We will see you back in about 2 months.   Aliene Altes, PA-C [facility]

## 2019-03-30 NOTE — Assessment & Plan Note (Addendum)
Peptic stricture associated with dysphagia identified during EGD in March 2020. Balloon dilation at that time. Dr. [name] recommended repeat EGD in 3 months for retreatment. No dyaphagia at this time.  Continues to have intermittent short lived burning in his throat typically after meals. Intermittent vomiting at night started a couple months ago. Occurs once every 2-3 weeks without nausea. Has been taking carafate with meals and protonix 40 mg BID.  Proceed with EGD with Dr. [name] with propofol in the near future. The risks, benefits, and alternatives have been discussed in detail with patient. They have stated understanding and desire to proceed.   Continue Carafate with meals and Protonix 40 mg BID. Consider therapy adjustment pending EGD findings.   Addendum: See description below. EGD on hold at this time. Will pursue colonoscopy for IDA per Dr. [name].

## 2019-03-30 NOTE — Assessment & Plan Note (Addendum)
Patient does not have a long standing history of anemia. Prior to hospitalization in middle of November, hemoglobin on file was 13-14 range. While hospitalized in middle of November 2019, Hemoglobin was in the 10-11 range. EGD at that time with severe esophagitis and PUD. Hemoglobin on 11/26 was 9.7. Most recent hemoglobin on file on 12/09/18 was 11.7. MCH and MCHC slightly below normal at that time with MCH 25.9 and MCHC 29.8. PUD had healed on last EGD on March 2020. Overall, this seems stable with no overt GI bleeding noted. Patient has reported emesis occurring once every 2-3 weeks that is brown, but no bright red blood or coffee ground appearance.   Will repeat CBC and iron studies today.  Proceed with EGD as described above.  Patient has never had colonoscopy. Will discuss getting this scheduled at next visit as patient needs to go ahead and have EGD.   Addendum: Labs resulted with hemoglobin 12.3, ferritin 12, iron 78, TIBC 346.  Per Dr. [name], due to patients IDA and no prior colonoscopy, he recommended pursuing a colonoscopy at this time and consider EGD at a later date.  I have spoken with the patient about this change and discussed risks and benefits. He stated his understanding and agrees to move forward with colonoscopy.  Patient will now have colonoscopy with propofol on 04/07/19 with Dr. [name].

## 2019-03-31 NOTE — Patient Instructions (Signed)
Evan Chavez  03/31/2019     @PREFPERIOPPHARMACY @   Your procedure is scheduled on  04/07/2019 .  Report to [facility] at  915   A.M.  Call this number if you have problems the morning of surgery:  [phone]   Remember:  Follow the deit and prep instructions given to you by Dr Roseanne Kaufman office.                     Take these medicines the morning of surgery with A SIP OF WATER  Amantadine, diltiazem, reglan, protonix.    Do not wear jewelry, make-up or nail polish.  Do not wear lotions, powders, or perfumes, or deodorant.  Do not shave 48 hours prior to surgery.  Men may shave face and neck.  Do not bring valuables to the hospital.  [facility] is not responsible for any belongings or valuables.  Contacts, dentures or bridgework may not be worn into surgery.  Leave your suitcase in the car.  After surgery it may be brought to your room.  For patients admitted to the hospital, discharge time will be determined by your treatment team.  Patients discharged the day of surgery will not be allowed to drive home.   Name and phone number of your driver:   family Special instructions:  None  Please read over the following fact sheets that you were given. Anesthesia Post-op Instructions and Care and Recovery After Surgery       Upper Endoscopy, Adult, Care After This sheet gives you information about how to care for yourself after your procedure. Your health care provider may also give you more specific instructions. If you have problems or questions, contact your health care provider. What can I expect after the procedure? After the procedure, it is common to have:  A sore throat.  Mild stomach pain or discomfort.  Bloating.  Nausea. Follow these instructions at home:   Follow instructions from your health care provider about what to eat or drink after your procedure.  Return to your normal activities as told by your health care provider. Ask your health  care provider what activities are safe for you.  Take over-the-counter and prescription medicines only as told by your health care provider.  Do not drive for 24 hours if you were given a sedative during your procedure.  Keep all follow-up visits as told by your health care provider. This is important. Contact a health care provider if you have:  A sore throat that lasts longer than one day.  Trouble swallowing. Get help right away if:  You vomit blood or your vomit looks like coffee grounds.  You have: ? A fever. ? Bloody, black, or tarry stools. ? A severe sore throat or you cannot swallow. ? Difficulty breathing. ? Severe pain in your chest or abdomen. Summary  After the procedure, it is common to have a sore throat, mild stomach discomfort, bloating, and nausea.  Do not drive for 24 hours if you were given a sedative during the procedure.  Follow instructions from your health care provider about what to eat or drink after your procedure.  Return to your normal activities as told by your health care provider. This information is not intended to replace advice given to you by your health care provider. Make sure you discuss any questions you have with your health care provider. Document Released: 03/30/2012 Document Revised: 03/01/2018 Document Reviewed: 03/01/2018 Elsevier Interactive Patient  Education  2019 [place], Care After These instructions provide you with information about caring for yourself after your procedure. Your health care provider may also give you more specific instructions. Your treatment has been planned according to current medical practices, but problems sometimes occur. Call your health care provider if you have any problems or questions after your procedure. What can I expect after the procedure? After your procedure, you may:  Feel sleepy for several hours.  Feel clumsy and have poor balance for several hours.   Feel forgetful about what happened after the procedure.  Have poor judgment for several hours.  Feel nauseous or vomit.  Have a sore throat if you had a breathing tube during the procedure. Follow these instructions at home: For at least 24 hours after the procedure:      Have a responsible adult stay with you. It is important to have someone help care for you until you are awake and alert.  Rest as needed.  Do not: ? Participate in activities in which you could fall or become injured. ? Drive. ? Use heavy machinery. ? Drink alcohol. ? Take sleeping pills or medicines that cause drowsiness. ? Make important decisions or sign legal documents. ? Take care of children on your own. Eating and drinking  Follow the diet that is recommended by your health care provider.  If you vomit, drink water, juice, or soup when you can drink without vomiting.  Make sure you have little or no nausea before eating solid foods. General instructions  Take over-the-counter and prescription medicines only as told by your health care provider.  If you have sleep apnea, surgery and certain medicines can increase your risk for breathing problems. Follow instructions from your health care provider about wearing your sleep device: ? Anytime you are sleeping, including during daytime naps. ? While taking prescription pain medicines, sleeping medicines, or medicines that make you drowsy.  If you smoke, do not smoke without supervision.  Keep all follow-up visits as told by your health care provider. This is important. Contact a health care provider if:  You keep feeling nauseous or you keep vomiting.  You feel light-headed.  You develop a rash.  You have a fever. Get help right away if:  You have trouble breathing. Summary  For several hours after your procedure, you may feel sleepy and have poor judgment.  Have a responsible adult stay with you for at least 24 hours or until you are  awake and alert. This information is not intended to replace advice given to you by your health care provider. Make sure you discuss any questions you have with your health care provider. Document Released: 01/20/2016 Document Revised: 05/15/2017 Document Reviewed: 01/20/2016 Elsevier Interactive Patient Education  2019 [employer].

## 2019-04-01 ENCOUNTER — Encounter (HOSPITAL_COMMUNITY)
Admission: RE | Admit: 2019-04-01 | Discharge: 2019-04-01 | Disposition: A | Discharge status: Home / Self Care | Payer: Medicaid Other | Source: Ambulatory Visit | Attending: Internal Medicine | Admitting: Internal Medicine

## 2019-04-01 ENCOUNTER — Other Ambulatory Visit: Payer: Self-pay

## 2019-04-01 DIAGNOSIS — Z01812 Encounter for preprocedural laboratory examination: Secondary | ICD-10-CM | POA: Diagnosis present

## 2019-04-01 LAB — CBC WITH DIFFERENTIAL/PLATELET
Abs Immature Granulocytes: 0.02 10*3/uL (ref 0.00–0.07)
Absolute Monocytes: 279 cells/uL (ref 200–950)
Basophils Absolute: 41 cells/uL (ref 0–200)
Basophils Absolute: 0 10*3/uL (ref 0.0–0.1)
Basophils Relative: 1 %
Basophils Relative: 0 %
Eosinophils Absolute: 41 cells/uL (ref 15–500)
Eosinophils Absolute: 0 10*3/uL (ref 0.0–0.5)
Eosinophils Relative: 1 %
Eosinophils Relative: 0 %
HCT: 38 % — ABNORMAL LOW (ref 38.5–50.0)
HCT: 46.5 % (ref 39.0–52.0)
Hemoglobin: 12.3 g/dL — ABNORMAL LOW (ref 13.2–17.1)
Hemoglobin: 14.3 g/dL (ref 13.0–17.0)
Immature Granulocytes: 0 %
Lymphocytes Relative: 15 %
Lymphs Abs: 1439 cells/uL (ref 850–3900)
Lymphs Abs: 1.2 10*3/uL (ref 0.7–4.0)
MCH: 26.9 pg — ABNORMAL LOW (ref 27.0–33.0)
MCH: 26.9 pg (ref 26.0–34.0)
MCHC: 32.4 g/dL (ref 32.0–36.0)
MCHC: 30.8 g/dL (ref 30.0–36.0)
MCV: 83 fL (ref 80.0–100.0)
MCV: 87.4 fL (ref 80.0–100.0)
MPV: 11.3 fL (ref 7.5–12.5)
Monocytes Absolute: 0.5 10*3/uL (ref 0.1–1.0)
Monocytes Relative: 6.8 %
Monocytes Relative: 6 %
Neutro Abs: 2300 cells/uL (ref 1500–7800)
Neutro Abs: 6.4 10*3/uL (ref 1.7–7.7)
Neutrophils Relative %: 56.1 %
Neutrophils Relative %: 79 %
Platelets: 231 10*3/uL (ref 140–400)
Platelets: 302 10*3/uL (ref 150–400)
RBC: 4.58 10*6/uL (ref 4.20–5.80)
RBC: 5.32 MIL/uL (ref 4.22–5.81)
RDW: 13.3 % (ref 11.0–15.0)
RDW: 13.2 % (ref 11.5–15.5)
Total Lymphocyte: 35.1 %
WBC: 4.1 10*3/uL (ref 3.8–10.8)
WBC: 8.2 10*3/uL (ref 4.0–10.5)
nRBC: 0 % (ref 0.0–0.2)

## 2019-04-01 LAB — COMPREHENSIVE METABOLIC PANEL
ALT: 42 U/L (ref 0–44)
AST: 25 U/L (ref 15–41)
Albumin: 5.1 g/dL — ABNORMAL HIGH (ref 3.5–5.0)
Alkaline Phosphatase: 83 U/L (ref 38–126)
Anion gap: 11 (ref 5–15)
BUN: 15 mg/dL (ref 6–20)
CO2: 30 mmol/L (ref 22–32)
Calcium: 10.1 mg/dL (ref 8.9–10.3)
Chloride: 99 mmol/L (ref 98–111)
Creatinine, Ser: 1.83 mg/dL — ABNORMAL HIGH (ref 0.61–1.24)
GFR calc Af Amer: 46 mL/min — ABNORMAL LOW (ref >60)
GFR calc non Af Amer: 40 mL/min — ABNORMAL LOW (ref >60)
Glucose, Bld: 138 mg/dL — ABNORMAL HIGH (ref 70–99)
Potassium: 4.1 mmol/L (ref 3.5–5.1)
Sodium: 140 mmol/L (ref 135–145)
Total Bilirubin: 0.5 mg/dL (ref 0.3–1.2)
Total Protein: 9.4 g/dL — ABNORMAL HIGH (ref 6.5–8.1)

## 2019-04-01 LAB — IRON,TIBC AND FERRITIN PANEL
%SAT: 23 % (calc) (ref 20–48)
Ferritin: 12 ng/mL — ABNORMAL LOW (ref 38–380)
Iron: 78 ug/dL (ref 50–180)
TIBC: 346 mcg/dL (calc) (ref 250–425)

## 2019-04-01 LAB — RAPID URINE DRUG SCREEN, HOSP PERFORMED
Amphetamines: NOT DETECTED
Barbiturates: NOT DETECTED
Benzodiazepines: NOT DETECTED
Cocaine: NOT DETECTED
Opiates: NOT DETECTED
Tetrahydrocannabinol: NOT DETECTED

## 2019-04-01 LAB — IRON AND TIBC
Iron: 49 ug/dL (ref 45–182)
Saturation Ratios: 11 % — ABNORMAL LOW (ref 17.9–39.5)
TIBC: 465 ug/dL — ABNORMAL HIGH (ref 250–450)
UIBC: 416 ug/dL

## 2019-04-01 LAB — FERRITIN: Ferritin: 15 ng/mL — ABNORMAL LOW (ref 24–336)

## 2019-04-01 NOTE — Progress Notes (Signed)
Evan Chavez, can you let patient know his hemoglobin is still low, but stable and improving. His iron studies are low and he needs to start taking an OTC iron supplement twice daily. We will need to repeat his labs in 2 months.

## 2019-04-04 ENCOUNTER — Other Ambulatory Visit (HOSPITAL_COMMUNITY)
Admission: RE | Admit: 2019-04-04 | Discharge: 2019-04-04 | Disposition: A | Discharge status: Home / Self Care | Payer: Medicaid Other | Source: Ambulatory Visit | Attending: Internal Medicine | Admitting: Internal Medicine

## 2019-04-04 ENCOUNTER — Other Ambulatory Visit: Payer: Self-pay

## 2019-04-04 ENCOUNTER — Telehealth: Payer: Self-pay | Admitting: Gastroenterology

## 2019-04-04 DIAGNOSIS — Z1159 Encounter for screening for other viral diseases: Secondary | ICD-10-CM | POA: Insufficient documentation

## 2019-04-04 DIAGNOSIS — D509 Iron deficiency anemia, unspecified: Secondary | ICD-10-CM

## 2019-04-04 DIAGNOSIS — E611 Iron deficiency: Secondary | ICD-10-CM

## 2019-04-04 MED ORDER — CLENPIQ 10-3.5-12 MG-GM -GM/160ML PO SOLN: 1.0000 | Freq: Once | ORAL | 0 refills | Status: AC

## 2019-04-04 NOTE — Telephone Encounter (Signed)
Does patient have instructions for colonoscopy?

## 2019-04-04 NOTE — Telephone Encounter (Signed)
Spoke with patient and explained the reasoning for changing his procedure from EGD to colonoscopy. Risks and benefits of procedure were discussed. Patient stated his understanding and agrees to move forward with this. We will consider pursuing EGD in the future. Also reinforced GERD diet and lifestyle modifications, avoiding fried fatty foods, avoiding caffinated beverages, eating smaller meals for dinner, ensuring he stays upright while eating, not eating within 3 hours of going to bed, and trying to elevate the head of his bed at night.

## 2019-04-04 NOTE — Addendum Note (Signed)
Addended by: Hassan Rowan on: 04/04/2019 03:08 PM   Modules accepted: Orders

## 2019-04-04 NOTE — Telephone Encounter (Signed)
Informed endo scheduler to cancel EGD orders for 04/07/19. New order entered for TCS w/Propofol w/RMR 04/07/19 at 10:45am.

## 2019-04-04 NOTE — Telephone Encounter (Signed)
[facility] clinical pool:  Discussed with Dr. [name] need for colonoscopy due to [place]. He has never had a colonoscopy. EGD can be put on hold now and addressed at a later date. For now, needs colonoscopy with Propofol due to IDA.  He has a spot on 6/25 for EGD: can we change this to TCS with Propofol with Dr. [name] on 6/25?   He will need to stop oral iron (if he has already started this). Cyril Mourning can call him with update regarding plan of care once we have the procedures changed out.

## 2019-04-04 NOTE — Telephone Encounter (Signed)
Picked up call after Evan Chavez spoke to pt, he had gave the phone to his mom. Informed her rx for prep would be sent to pharmacy. She is also aware to pick up prep instructions at the pharmacy. Prep instructions faxed to [facility] and rx sent.  Called her back to inform her he needs to stop Iron. She was unaware of recommendation for OTC Iron. Informed her of lab result note from 04/01/19. She is aware he shouldn't start Iron until after TCS.

## 2019-04-05 LAB — NOVEL CORONAVIRUS, NAA (HOSP ORDER, SEND-OUT TO REF LAB; TAT 18-24 HRS): SARS-CoV-2, NAA: NOT DETECTED

## 2019-04-07 ENCOUNTER — Ambulatory Visit (HOSPITAL_COMMUNITY): Payer: Medicaid Other | Admitting: Anesthesiology

## 2019-04-07 ENCOUNTER — Encounter (HOSPITAL_COMMUNITY): Admission: RE | Payer: Self-pay | Source: Home / Self Care

## 2019-04-07 ENCOUNTER — Encounter (HOSPITAL_COMMUNITY): Payer: Self-pay | Admitting: *Deleted

## 2019-04-07 ENCOUNTER — Encounter (HOSPITAL_COMMUNITY)
Admission: RE | Disposition: A | Discharge status: Home / Self Care | Payer: Self-pay | Source: Home / Self Care | Attending: Internal Medicine

## 2019-04-07 ENCOUNTER — Ambulatory Visit (HOSPITAL_COMMUNITY): Admission: RE | Admit: 2019-04-07 | Payer: Medicaid Other | Source: Home / Self Care | Admitting: Internal Medicine

## 2019-04-07 ENCOUNTER — Ambulatory Visit (HOSPITAL_COMMUNITY)
Admission: RE | Admit: 2019-04-07 | Discharge: 2019-04-07 | Disposition: A | Discharge status: Home / Self Care | Payer: Medicaid Other | Attending: Internal Medicine | Admitting: Internal Medicine

## 2019-04-07 DIAGNOSIS — D509 Iron deficiency anemia, unspecified: Secondary | ICD-10-CM | POA: Diagnosis present

## 2019-04-07 DIAGNOSIS — E785 Hyperlipidemia, unspecified: Secondary | ICD-10-CM | POA: Diagnosis not present

## 2019-04-07 DIAGNOSIS — I69351 Hemiplegia and hemiparesis following cerebral infarction affecting right dominant side: Secondary | ICD-10-CM | POA: Insufficient documentation

## 2019-04-07 DIAGNOSIS — F1410 Cocaine abuse, uncomplicated: Secondary | ICD-10-CM | POA: Diagnosis not present

## 2019-04-07 DIAGNOSIS — F1411 Cocaine abuse, in remission: Secondary | ICD-10-CM | POA: Insufficient documentation

## 2019-04-07 DIAGNOSIS — K2270 Barrett's esophagus without dysplasia: Secondary | ICD-10-CM | POA: Diagnosis not present

## 2019-04-07 DIAGNOSIS — Z7982 Long term (current) use of aspirin: Secondary | ICD-10-CM | POA: Insufficient documentation

## 2019-04-07 DIAGNOSIS — Z8379 Family history of other diseases of the digestive system: Secondary | ICD-10-CM | POA: Insufficient documentation

## 2019-04-07 DIAGNOSIS — Z79899 Other long term (current) drug therapy: Secondary | ICD-10-CM | POA: Diagnosis not present

## 2019-04-07 DIAGNOSIS — Z8711 Personal history of peptic ulcer disease: Secondary | ICD-10-CM | POA: Diagnosis not present

## 2019-04-07 DIAGNOSIS — Z8349 Family history of other endocrine, nutritional and metabolic diseases: Secondary | ICD-10-CM | POA: Insufficient documentation

## 2019-04-07 DIAGNOSIS — K279 Peptic ulcer, site unspecified, unspecified as acute or chronic, without hemorrhage or perforation: Secondary | ICD-10-CM

## 2019-04-07 DIAGNOSIS — Z8249 Family history of ischemic heart disease and other diseases of the circulatory system: Secondary | ICD-10-CM | POA: Insufficient documentation

## 2019-04-07 DIAGNOSIS — K635 Polyp of colon: Secondary | ICD-10-CM | POA: Diagnosis not present

## 2019-04-07 DIAGNOSIS — K2100 Gastro-esophageal reflux disease with esophagitis, without bleeding: Secondary | ICD-10-CM

## 2019-04-07 DIAGNOSIS — Z87891 Personal history of nicotine dependence: Secondary | ICD-10-CM | POA: Insufficient documentation

## 2019-04-07 DIAGNOSIS — D123 Benign neoplasm of transverse colon: Secondary | ICD-10-CM | POA: Diagnosis not present

## 2019-04-07 DIAGNOSIS — F1011 Alcohol abuse, in remission: Secondary | ICD-10-CM | POA: Insufficient documentation

## 2019-04-07 DIAGNOSIS — K641 Second degree hemorrhoids: Secondary | ICD-10-CM | POA: Diagnosis not present

## 2019-04-07 DIAGNOSIS — K222 Esophageal obstruction: Secondary | ICD-10-CM

## 2019-04-07 HISTORY — PX: POLYPECTOMY: SHX5525

## 2019-04-07 HISTORY — PX: COLONOSCOPY WITH PROPOFOL: SHX5780

## 2019-04-07 SURGERY — COLONOSCOPY WITH PROPOFOL
Anesthesia: Monitor Anesthesia Care

## 2019-04-07 SURGERY — ESOPHAGOGASTRODUODENOSCOPY (EGD) WITH PROPOFOL
Anesthesia: Monitor Anesthesia Care

## 2019-04-07 MED ORDER — CHLORHEXIDINE GLUCONATE CLOTH 2 % EX PADS: 6.0000 | MEDICATED_PAD | Freq: Once | CUTANEOUS | Status: DC

## 2019-04-07 MED ORDER — HYDROMORPHONE HCL 1 MG/ML IJ SOLN: 0.2500 mg | INTRAMUSCULAR | Status: DC | PRN

## 2019-04-07 MED ORDER — PROPOFOL 10 MG/ML IV BOLUS
INTRAVENOUS | Status: DC | PRN
  Administered 2019-04-07: 20 mg via INTRAVENOUS

## 2019-04-07 MED ORDER — LACTATED RINGERS IV SOLN: INTRAVENOUS | Status: DC

## 2019-04-07 MED ORDER — LACTATED RINGERS IV SOLN
INTRAVENOUS | Status: DC
  Administered 2019-04-07: 11:00:00 via INTRAVENOUS

## 2019-04-07 MED ORDER — PROPOFOL 500 MG/50ML IV EMUL
INTRAVENOUS | Status: DC | PRN
  Administered 2019-04-07: 150 ug/kg/min via INTRAVENOUS

## 2019-04-07 MED ORDER — PROMETHAZINE HCL 25 MG/ML IJ SOLN: 6.2500 mg | INTRAMUSCULAR | Status: DC | PRN

## 2019-04-07 MED ORDER — MEPERIDINE HCL 50 MG/ML IJ SOLN: 6.2500 mg | INTRAMUSCULAR | Status: DC | PRN

## 2019-04-07 MED ORDER — HYDROCODONE-ACETAMINOPHEN 7.5-325 MG PO TABS: 1.0000 | ORAL_TABLET | Freq: Once | ORAL | Status: DC | PRN

## 2019-04-07 NOTE — Anesthesia Postprocedure Evaluation (Signed)
Anesthesia Post Note  Patient: Evan Chavez  Procedure(s) Performed: COLONOSCOPY WITH PROPOFOL (N/A ) POLYPECTOMY  Anesthesia Type: MAC Level of consciousness: patient cooperative and awake Pain management: pain level controlled Vital Signs Assessment: post-procedure vital signs reviewed and stable Respiratory status: spontaneous breathing and respiratory function stable Postop Assessment: no apparent nausea or vomiting Anesthetic complications: no     Last Vitals:  Vitals:   04/07/19 1022 04/07/19 1132  BP: 135/87 110/72  Pulse: 72 86  Resp: 18 (!) 25  Temp: 36.9 C (P) 36.6 C  SpO2: 100% 100%    Last Pain:  Vitals:   04/07/19 1058  TempSrc:   PainSc: 0-No pain                 ADAMS, AMY A

## 2019-04-07 NOTE — Discharge Instructions (Signed)
Colonoscopy Discharge Instructions  Read the instructions outlined below and refer to this sheet in the next few weeks. These discharge instructions provide you with general information on caring for yourself after you leave the hospital. Your doctor may also give you specific instructions. While your treatment has been planned according to the most current medical practices available, unavoidable complications occasionally occur. If you have any problems or questions after discharge, call Dr. [name] at [phone]. ACTIVITY  You may resume your regular activity, but move at a slower pace for the next 24 hours.   Take frequent rest periods for the next 24 hours.   Walking will help get rid of the air and reduce the bloated feeling in your belly (abdomen).   No driving for 24 hours (because of the medicine (anesthesia) used during the test).    Do not sign any important legal documents or operate any machinery for 24 hours (because of the anesthesia used during the test).  NUTRITION  Drink plenty of fluids.   You may resume your normal diet as instructed by your doctor.   Begin with a light meal and progress to your normal diet. Heavy or fried foods are harder to digest and may make you feel sick to your stomach (nauseated).   Avoid alcoholic beverages for 24 hours or as instructed.  MEDICATIONS  You may resume your normal medications unless your doctor tells you otherwise.  WHAT YOU CAN EXPECT TODAY  Some feelings of bloating in the abdomen.   Passage of more gas than usual.   Spotting of blood in your stool or on the toilet paper.  IF YOU HAD POLYPS REMOVED DURING THE COLONOSCOPY:  No aspirin products for 7 days or as instructed.   No alcohol for 7 days or as instructed.   Eat a soft diet for the next 24 hours.  FINDING OUT THE RESULTS OF YOUR TEST Not all test results are available during your visit. If your test results are not back during the visit, make an appointment  with your caregiver to find out the results. Do not assume everything is normal if you have not heard from your caregiver or the medical facility. It is important for you to follow up on all of your test results.  SEEK IMMEDIATE MEDICAL ATTENTION IF:  You have more than a spotting of blood in your stool.   Your belly is swollen (abdominal distention).   You are nauseated or vomiting.   You have a temperature over 101.   You have abdominal pain or discomfort that is severe or gets worse throughout the day.   Colon polyp and hemorrhoid information provided  Further recommendations to follow pending review of pathology report  Office visit with [place] in 3 months  At patient's request, I called his mother, Peter Congo, [phone] and left a message   Hemorrhoids Hemorrhoids are swollen veins in and around the rectum or anus. There are two types of hemorrhoids:  Internal hemorrhoids. These occur in the veins that are just inside the rectum. They may poke through to the outside and become irritated and painful.  External hemorrhoids. These occur in the veins that are outside the anus and can be felt as a painful swelling or hard lump near the anus. Most hemorrhoids do not cause serious problems, and they can be managed with home treatments such as diet and lifestyle changes. If home treatments do not help the symptoms, procedures can be done to shrink or remove the hemorrhoids. What  are the causes? This condition is caused by increased pressure in the anal area. This pressure may result from various things, including:  Constipation.  Straining to have a bowel movement.  Diarrhea.  Pregnancy.  Obesity.  Sitting for long periods of time.  Heavy lifting or other activity that causes you to strain.  Anal sex.  Riding a bike for a long period of time. What are the signs or symptoms? Symptoms of this condition include:  Pain.  Anal itching or irritation.  Rectal  bleeding.  Leakage of stool (feces).  Anal swelling.  One or more lumps around the anus. How is this diagnosed? This condition can often be diagnosed through a visual exam. Other exams or tests may also be done, such as:  An exam that involves feeling the rectal area with a gloved hand (digital rectal exam).  An exam of the anal canal that is done using a small tube (anoscope).  A blood test, if you have lost a significant amount of blood.  A test to look inside the colon using a flexible tube with a camera on the end (sigmoidoscopy or colonoscopy). How is this treated? This condition can usually be treated at home. However, various procedures may be done if dietary changes, lifestyle changes, and other home treatments do not help your symptoms. These procedures can help make the hemorrhoids smaller or remove them completely. Some of these procedures involve surgery, and others do not. Common procedures include:  Rubber band ligation. Rubber bands are placed at the base of the hemorrhoids to cut off their blood supply.  Sclerotherapy. Medicine is injected into the hemorrhoids to shrink them.  Infrared coagulation. A type of light energy is used to get rid of the hemorrhoids.  Hemorrhoidectomy surgery. The hemorrhoids are surgically removed, and the veins that supply them are tied off.  Stapled hemorrhoidopexy surgery. The surgeon staples the base of the hemorrhoid to the rectal wall. Follow these instructions at home: Eating and drinking   Eat foods that have a lot of fiber in them, such as whole grains, beans, nuts, fruits, and vegetables.  Ask your health care provider about taking products that have added fiber (fiber supplements).  Reduce the amount of fat in your diet. You can do this by eating low-fat dairy products, eating less red meat, and avoiding processed foods.  Drink enough fluid to keep your urine pale yellow. Managing pain and swelling   Take warm sitz baths  for 20 minutes, 3-4 times a day to ease pain and discomfort. You may do this in a bathtub or using a portable sitz bath that fits over the toilet.  If directed, apply ice to the affected area. Using ice packs between sitz baths may be helpful. ? Put ice in a plastic bag. ? Place a towel between your skin and the bag. ? Leave the ice on for 20 minutes, 2-3 times a day. General instructions  Take over-the-counter and prescription medicines only as told by your health care provider.  Use medicated creams or suppositories as told.  Get regular exercise. Ask your health care provider how much and what kind of exercise is best for you. In general, you should do moderate exercise for at least 30 minutes on most days of the week (150 minutes each week). This can include activities such as walking, biking, or yoga.  Go to the bathroom when you have the urge to have a bowel movement. Do not wait.  Avoid straining to have bowel  movements.  Keep the anal area dry and clean. Use wet toilet paper or moist towelettes after a bowel movement.  Do not sit on the toilet for long periods of time. This increases blood pooling and pain.  Keep all follow-up visits as told by your health care provider. This is important. Contact a health care provider if you have:  Increasing pain and swelling that are not controlled by treatment or medicine.  Difficulty having a bowel movement, or you are unable to have a bowel movement.  Pain or inflammation outside the area of the hemorrhoids. Get help right away if you have:  Uncontrolled bleeding from your rectum. Summary  Hemorrhoids are swollen veins in and around the rectum or anus.  Most hemorrhoids can be managed with home treatments such as diet and lifestyle changes.  Taking warm sitz baths can help ease pain and discomfort.  In severe cases, procedures or surgery can be done to shrink or remove the hemorrhoids. This information is not intended to  replace advice given to you by your health care provider. Make sure you discuss any questions you have with your health care provider. Document Released: 09/26/2000 Document Revised: 02/18/2018 Document Reviewed: 02/18/2018 Elsevier Interactive Patient Education  2019 [employer].  Colon Polyps  Polyps are tissue growths inside the body. Polyps can grow in many places, including the large intestine (colon). A polyp may be a round bump or a mushroom-shaped growth. You could have one polyp or several. Most colon polyps are noncancerous (benign). However, some colon polyps can become cancerous over time. Finding and removing the polyps early can help prevent this. What are the causes? The exact cause of colon polyps is not known. What increases the risk? You are more likely to develop this condition if you:  Have a family history of colon cancer or colon polyps.  Are older than 14 or older than 45 if you are African American.  Have inflammatory bowel disease, such as ulcerative colitis or Crohn's disease.  Have certain hereditary conditions, such as: ? Familial adenomatous polyposis. ? Lynch syndrome. ? Turcot syndrome. ? Peutz-Jeghers syndrome.  Are overweight.  Smoke cigarettes.  Do not get enough exercise.  Drink too much alcohol.  Eat a diet that is high in fat and red meat and low in fiber.  Had childhood cancer that was treated with abdominal radiation. What are the signs or symptoms? Most polyps do not cause symptoms. If you have symptoms, they may include:  Blood coming from your rectum when having a bowel movement.  Blood in your stool. The stool may look dark red or black.  Abdominal pain.  A change in bowel habits, such as constipation or diarrhea. How is this diagnosed? This condition is diagnosed with a colonoscopy. This is a procedure in which a lighted, flexible scope is inserted into the anus and then passed into the colon to examine the area. Polyps are  sometimes found when a colonoscopy is done as part of routine cancer screening tests. How is this treated? Treatment for this condition involves removing any polyps that are found. Most polyps can be removed during a colonoscopy. Those polyps will then be tested for cancer. Additional treatment may be needed depending on the results of testing. Follow these instructions at home: Lifestyle  Maintain a healthy weight, or lose weight if recommended by your health care provider.  Exercise every day or as told by your health care provider.  Do not use any products that contain nicotine  or tobacco, such as cigarettes and e-cigarettes. If you need help quitting, ask your health care provider.  If you drink alcohol, limit how much you have: ? 0-1 drink a day for women. ? 0-2 drinks a day for men.  Be aware of how much alcohol is in your drink. In the [place] one drink equals one 12 oz bottle of beer (355 mL), one 5 oz glass of wine (148 mL), or one 1 oz shot of hard liquor (44 mL). Eating and drinking   Eat foods that are high in fiber, such as fruits, vegetables, and whole grains.  Eat foods that are high in calcium and vitamin D, such as milk, cheese, yogurt, eggs, liver, fish, and broccoli.  Limit foods that are high in fat, such as fried foods and desserts.  Limit the amount of red meat and processed meat you eat, such as hot dogs, sausage, bacon, and lunch meats. General instructions  Keep all follow-up visits as told by your health care provider. This is important. ? This includes having regularly scheduled colonoscopies. ? Talk to your health care provider about when you need a colonoscopy. Contact a health care provider if:  You have new or worsening bleeding during a bowel movement.  You have new or increased blood in your stool.  You have a change in bowel habits.  You lose weight for no known reason. Summary  Polyps are tissue growths inside the body. Polyps can grow in  many places, including the colon.  Most colon polyps are noncancerous (benign), but some can become cancerous over time.  This condition is diagnosed with a colonoscopy.  Treatment for this condition involves removing any polyps that are found. Most polyps can be removed during a colonoscopy. This information is not intended to replace advice given to you by your health care provider. Make sure you discuss any questions you have with your health care provider. Document Released: 06/25/2004 Document Revised: 01/14/2018 Document Reviewed: 01/14/2018 Elsevier Interactive Patient Education  2019 [employer].

## 2019-04-07 NOTE — Op Note (Signed)
[facility] Patient Name: Evan Chavez Procedure Date: 04/07/2019 10:51 AM MRN: [id number] Date of Birth: 03-05-1961 Attending MD: Evan Chavez , MD CSN: [id number] Age: 58 Admit Type: Outpatient Procedure:                Colonoscopy Indications:              Iron deficiency anemia Providers:                Evan Richards, MD, Otis Peak B. Sharon Seller, RN,                            Nelma Rothman, Technician Referring MD:              Medicines:                Propofol per Anesthesia Complications:            No immediate complications. Estimated Blood Loss:     Estimated blood loss was minimal. Anal papilla Procedure:                Pre-Anesthesia Assessment:                           - Prior to the procedure, a History and Physical                            was performed, and patient medications and                            allergies were reviewed. The patient's tolerance of                            previous anesthesia was also reviewed. The risks                            and benefits of the procedure and the sedation                            options and risks were discussed with the patient.                            All questions were answered, and informed consent                            was obtained. Prior Anticoagulants: The patient has                            taken no previous anticoagulant or antiplatelet                            agents. ASA Grade Assessment: II - A patient with                            mild systemic disease. After reviewing the risks  and benefits, the patient was deemed in                            satisfactory condition to undergo the procedure.                           After obtaining informed consent, the colonoscope                            was passed under direct vision. Throughout the                            procedure, the patient's blood pressure, pulse, and   oxygen saturations were monitored continuously. The                            CF-[id number] ([id number]) scope was introduced through                            the anus and advanced to the the cecum, identified                            by appendiceal orifice and ileocecal valve. The                            ileocecal valve, appendiceal orifice, and rectum                            were photographed. Scope In: 11:03:48 AM Scope Out: 11:25:18 AM Scope Withdrawal Time: 0 hours 16 minutes 15 seconds  Total Procedure Duration: 0 hours 21 minutes 30 seconds  Findings:      The perianal and digital rectal examinations were normal.      Three semi-pedunculated polyps were found in the transverse colon. The       polyps were 4 to 8 mm in size. These polyps were removed with a cold       snare. Resection and retrieval were complete. Estimated blood loss was       minimal.      Non-bleeding internal hemorrhoids were found during retroflexion. The       hemorrhoids were moderate, medium-sized and Grade II (internal       hemorrhoids that prolapse but reduce spontaneously).      The exam was otherwise without abnormality on direct and retroflexion       views. Impression:               - Three 4 to 8 mm polyps in the transverse colon,                            removed with a cold snare. Resected and retrieved.                           - Non-bleeding internal hemorrhoids.                           - The examination was otherwise normal on direct  and retroflexion views. Moderate Sedation:      Moderate (conscious) sedation was personally administered by an       anesthesia professional. The following parameters were monitored: oxygen       saturation, heart rate, blood pressure, respiratory rate, EKG, adequacy       of pulmonary ventilation, and response to care. Recommendation:           - Patient has a contact number available for                             emergencies. The signs and symptoms of potential                            delayed complications were discussed with the                            patient. Return to normal activities tomorrow.                            Written discharge instructions were provided to the                            patient.                           - Resume previous diet.                           - Continue present medications.                           - Repeat colonoscopy date to be determined after                            pending pathology results are reviewed for                            surveillance based on pathology results.                           - Return to GI office in 3 months. Procedure Code(s):        --- Professional ---                           [phone], Colonoscopy, flexible; with removal of                            tumor(s), polyp(s), or other lesion(s) by snare                            technique Diagnosis Code(s):        --- Professional ---                           K63.5, Polyp of colon  K64.1, Second degree hemorrhoids                           D50.9, Iron deficiency anemia, unspecified CPT copyright 2019 American Medical Association. All rights reserved. The codes documented in this report are preliminary and upon coder review may  be revised to meet current compliance requirements. Evan Chavez. Rourk, MD Evan Richards, MD 04/07/2019 11:34:47 AM This report has been signed electronically. Number of Addenda: 0

## 2019-04-07 NOTE — Interval H&P Note (Signed)
History and Physical Interval Note:  04/07/2019 10:45 AM  Evan Chavez  has presented today for surgery, with the diagnosis of iron deficiency anemia.  The various methods of treatment have been discussed with the patient and family. After consideration of risks, benefits and other options for treatment, the patient has consented to  Procedure(s) with comments: COLONOSCOPY WITH PROPOFOL (N/A) - 10:45am as a surgical intervention.  The patient's history has been reviewed, patient examined, no change in status, stable for surgery.  I have reviewed the patient's chart and labs.  Questions were answered to the patient's satisfaction.     Wilmon Conover  No change.  Given the fact he has significant iron deficiency anemia is never had a colonoscopy will pursue colonoscopy today per plan.  Possible elective EGD at a later date.  The risks, benefits, limitations, alternatives and imponderables have been reviewed with the patient. Questions have been answered. All parties are agreeable.

## 2019-04-07 NOTE — Transfer of Care (Signed)
Immediate Anesthesia Transfer of Care Note  Patient: Evan Chavez  Procedure(s) Performed: COLONOSCOPY WITH PROPOFOL (N/A ) POLYPECTOMY  Patient Location: PACU  Anesthesia Type:MAC  Level of Consciousness: awake, alert , oriented and patient cooperative  Airway & Oxygen Therapy: Patient Spontanous Breathing  Post-op Assessment: Report given to RN and Post -op Vital signs reviewed and stable  Post vital signs: Reviewed and stable  Last Vitals:  Vitals Value Taken Time  BP 110/72 04/07/19 1132  Temp    Pulse 86 04/07/19 1134  Resp 26 04/07/19 1134  SpO2 100 % 04/07/19 1134  Vitals shown include unvalidated device data.  Last Pain:  Vitals:   04/07/19 1058  TempSrc:   PainSc: 0-No pain      Patients Stated Pain Goal: 8 (78/[id number]/81 [id number])  Complications: No apparent anesthesia complications

## 2019-04-07 NOTE — Progress Notes (Signed)
Patient waiting for mother to pick him up to take home. Patient denies complaints.

## 2019-04-07 NOTE — Anesthesia Preprocedure Evaluation (Signed)
Anesthesia Evaluation    Airway Mallampati: II       Dental  (+) Poor Dentition   Pulmonary pneumonia, former smoker,    breath sounds clear to auscultation       Cardiovascular hypertension,  Rhythm:regular     Neuro/Psych PSYCHIATRIC DISORDERS CVA    GI/Hepatic GERD  ,  Endo/Other    Renal/GU      Musculoskeletal   Abdominal   Peds  Hematology  (+) Blood dyscrasia, anemia ,   Anesthesia Other Findings ETOH, Cocaine abuse  Ischemic Stroke hx Poor historian  Reproductive/Obstetrics                             Anesthesia Physical Anesthesia Plan  ASA: IV  Anesthesia Plan: MAC   Post-op Pain Management:    Induction:   PONV Risk Score and Plan:   Airway Management Planned:   Additional Equipment:   Intra-op Plan:   Post-operative Plan:   Informed Consent: I have reviewed the patients History and Physical, chart, labs and discussed the procedure including the risks, benefits and alternatives for the proposed anesthesia with the patient or authorized representative who has indicated his/her understanding and acceptance.       Plan Discussed with: Anesthesiologist  Anesthesia Plan Comments:         Anesthesia Quick Evaluation

## 2019-04-08 ENCOUNTER — Encounter: Payer: Self-pay | Admitting: Internal Medicine

## 2019-04-12 ENCOUNTER — Encounter (HOSPITAL_COMMUNITY): Payer: Self-pay | Admitting: Internal Medicine

## 2019-05-09 ENCOUNTER — Other Ambulatory Visit: Payer: Self-pay

## 2019-05-09 DIAGNOSIS — E611 Iron deficiency: Secondary | ICD-10-CM

## 2019-05-17 LAB — CBC WITH DIFFERENTIAL/PLATELET
Absolute Monocytes: 336 cells/uL (ref 200–950)
Basophils Absolute: 29 cells/uL (ref 0–200)
Basophils Relative: 0.6 %
Eosinophils Absolute: 101 cells/uL (ref 15–500)
Eosinophils Relative: 2.1 %
HCT: 36.9 % — ABNORMAL LOW (ref 38.5–50.0)
Hemoglobin: 12 g/dL — ABNORMAL LOW (ref 13.2–17.1)
Lymphs Abs: 1728 cells/uL (ref 850–3900)
MCH: 27.3 pg (ref 27.0–33.0)
MCHC: 32.5 g/dL (ref 32.0–36.0)
MCV: 83.9 fL (ref 80.0–100.0)
MPV: 11.3 fL (ref 7.5–12.5)
Monocytes Relative: 7 %
Neutro Abs: 2606 cells/uL (ref 1500–7800)
Neutrophils Relative %: 54.3 %
Platelets: 235 10*3/uL (ref 140–400)
RBC: 4.4 10*6/uL (ref 4.20–5.80)
RDW: 13.2 % (ref 11.0–15.0)
Total Lymphocyte: 36 %
WBC: 4.8 10*3/uL (ref 3.8–10.8)

## 2019-05-17 LAB — IRON,TIBC AND FERRITIN PANEL
%SAT: 21 % (calc) (ref 20–48)
Ferritin: 13 ng/mL — ABNORMAL LOW (ref 38–380)
Iron: 71 ug/dL (ref 50–180)
TIBC: 346 mcg/dL (calc) (ref 250–425)

## 2019-05-20 ENCOUNTER — Telehealth: Payer: Self-pay | Admitting: Gastroenterology

## 2019-05-20 NOTE — Telephone Encounter (Signed)
lmom waiting on a return call.

## 2019-05-20 NOTE — Telephone Encounter (Signed)
Spoke with pt's mother, she was notified of results. Pt is taking an otc iron qod bid. Pt's mother is going to give the iron supplement bid daily now.

## 2019-05-20 NOTE — Telephone Encounter (Signed)
OK, when did he start taking the iron supplement? Can you ask them to look on the bottle and tell us how much iron is in each tablet? Also, has he had any brbpr, black stools, or hematemesis?

## 2019-05-20 NOTE — Telephone Encounter (Signed)
Patients hemoglobin 12.0, which is stable compared to 1 month ago. His ferritin is low and has dropped a little more. Can you ask if he has started taking OTC iron supplement twice daily? He was supposed to start this after TCS with Dr. [name] in June.

## 2019-05-24 NOTE — Telephone Encounter (Signed)
Per mom he has been taking iron supplement for a while. She increased it to 2 a day last week. It has 27mg  per tablet. Denies any brbpr, black stools, or hematemesis.

## 2019-05-24 NOTE — Telephone Encounter (Signed)
Patient mom is aware. She will purchase new iron tablet for 65mg  per tablet

## 2019-05-24 NOTE — Telephone Encounter (Signed)
Patient needs to be taking OTC iron supplement twice daily that has 65 mg iron per tablet.

## 2019-06-13 ENCOUNTER — Telehealth (HOSPITAL_COMMUNITY): Payer: Self-pay | Admitting: Internal Medicine

## 2019-06-13 NOTE — Telephone Encounter (Signed)
06/13/19  caller needed to change because he had another appt around the same time... rescheduled for 9/4

## 2019-06-15 ENCOUNTER — Other Ambulatory Visit: Payer: Self-pay

## 2019-06-15 ENCOUNTER — Encounter: Payer: Self-pay | Admitting: Gastroenterology

## 2019-06-15 ENCOUNTER — Encounter (HOSPITAL_COMMUNITY): Payer: Self-pay

## 2019-06-15 ENCOUNTER — Telehealth: Payer: Self-pay | Admitting: Gastroenterology

## 2019-06-15 ENCOUNTER — Ambulatory Visit: Payer: Medicaid Other | Admitting: Gastroenterology

## 2019-06-15 ENCOUNTER — Ambulatory Visit (HOSPITAL_COMMUNITY): Payer: Medicaid Other | Admitting: Physical Therapy

## 2019-06-15 VITALS — BP 147/95 | HR 94 | Temp 96.9°F | Ht 68.0 in | Wt 151.0 lb

## 2019-06-15 DIAGNOSIS — K2100 Gastro-esophageal reflux disease with esophagitis, without bleeding: Secondary | ICD-10-CM

## 2019-06-15 DIAGNOSIS — E611 Iron deficiency: Secondary | ICD-10-CM

## 2019-06-15 DIAGNOSIS — K2270 Barrett's esophagus without dysplasia: Secondary | ICD-10-CM | POA: Insufficient documentation

## 2019-06-15 DIAGNOSIS — D509 Iron deficiency anemia, unspecified: Secondary | ICD-10-CM | POA: Diagnosis not present

## 2019-06-15 NOTE — Assessment & Plan Note (Signed)
Clinically doing well.  Denies any heartburn, nausea or vomiting.  Appetite is better.  Would recommend that you maintain PPI therapy.  It is unclear why he is on Reglan, prescribed by Dr. Merlene Laughter.  Carafate remains on his list as well.  He may be potentially able to stop these medications at this point.  Obtain further information from Dr. Freddie Apley office as to reasoning behind Reglan, if for GI reasons would consider stopping.  Would consider stopping Carafate if he still on these.  Obtaining records from pharmacy.  Further recommendations to follow.

## 2019-06-15 NOTE — Assessment & Plan Note (Signed)
Consider 3-year surveillance EGD at time of colonoscopy in 2023.

## 2019-06-15 NOTE — Patient Instructions (Addendum)
1. We will send you for additional labs in 3 months to follow up on your anemia. We will send you a reminder.  2. Continue pantoprazole twice daily before meals.  3. We will be contacting your mother to verify that you are on iron and what dose aspirin you are taking.  4. Plan for next upper endoscopy at time of next colonoscopy in 03/2022.  5. Return to the office in six months.

## 2019-06-15 NOTE — Progress Notes (Signed)
Primary Care Physician: Rosita Fire, MD  Primary Gastroenterologist:  Garfield Cornea, MD   Chief Complaint  Patient presents with  . Gastroesophageal Reflux  . Anemia    HPI: Evan Chavez is a 58 y.o. male here for follow-up.  He has a history of severe reflux esophagitis and peptic ulcer disease.  Hospitalized in November 2019 with an EGD and at that time had severe esophagitis, extensive gastric erosions, duodenal ulceration.  Biopsy revealed Barrett's esophagus but negative for malignancy and H. pylori.  Repeat EGD in March 2020: Peptic stricture status post balloon dilatation.  Abnormal esophageal mucosa biopsy, completely healed previously noted peptic ulcer disease, hiatal hernia.  Biopsies with Barrett's esophagus without dysplasia or malignancy.  Recommended repeat EGD in 3 months for retreatment. When he presented for follow up it was elected to hold off of EGD as he no longer had dysphagia. Will need repeat EGD for Barrett's surveillance in 3 years.   Colonoscopy June 2020 for new IDA. He had 3 tubular adenomas removed ranging from 4 to 8 mm in size.  Next colonoscopy planned in 3 years.  Repeat labs on August 3 shows some further decline in ferritin from 15 down to 13.  Hemoglobin slightly low at 12.  June 18 hemoglobin was 12.3, June 19 hemoglobin 14.3.  Patient denies nausea or vomiting, abdominal pain, dysphagia.  He states his appetite is good.  Denies weight loss.  Bowel movements are regular.  No blood in the stool or melena.   Dr. Merlene Laughter started Reglan earlier this year.  Patient is also on Carafate, does not take four times per day. He has not had his pantoprazole filled since 03/17/2019. Will be contacting pharmacy to see if patient current with his medications.      Current Outpatient Medications  Medication Sig Dispense Refill  . acetaminophen (TYLENOL) 500 MG tablet Take 1,000 mg by mouth every 6 (six) hours as needed (for pain).    Marland Kitchen amantadine  (SYMMETREL) 100 MG capsule Take 100 mg by mouth daily.    Marland Kitchen aspirin 325 MG tablet Take 1 tablet (325 mg total) by mouth daily.    Marland Kitchen aspirin EC 81 MG tablet Take 81 mg by mouth daily.    Marland Kitchen atorvastatin (LIPITOR) 40 MG tablet Take 40 mg by mouth daily.    Marland Kitchen diltiazem (CARDIZEM CD) 120 MG 24 hr capsule Take 1 capsule (120 mg total) by mouth daily. (Patient taking differently: Take 120 mg by mouth daily after breakfast. ) 30 capsule 1  . metoCLOPramide (REGLAN) 5 MG tablet Take 5 mg by mouth 3 (three) times daily.    . Multiple Vitamin (MULTIVITAMIN WITH MINERALS) TABS tablet Take 1 tablet by mouth daily after breakfast.     . pantoprazole (PROTONIX) 40 MG tablet Take 1 tablet (40 mg total) by mouth 2 (two) times daily before a meal. 60 tablet 1  . sucralfate (CARAFATE) 1 g tablet TAKE 1 TABLET BY MOUTH 4 TIMES A DAY WITH MEALS AND AT BEDTIME (Patient taking differently: Take 1 g by mouth 4 (four) times daily -  with meals and at bedtime. ) 120 tablet 0   No current facility-administered medications for this visit.     Allergies as of 06/15/2019  . (No Known Allergies)    ROS:  General: Negative for anorexia, weight loss, fever, chills, fatigue, weakness. ENT: Negative for hoarseness, difficulty swallowing , nasal congestion. CV: Negative for chest pain, angina, palpitations, dyspnea on exertion, peripheral  edema.  Respiratory: Negative for dyspnea at rest, dyspnea on exertion, cough, sputum, wheezing.  GI: See history of present illness. GU:  Negative for dysuria, hematuria, urinary incontinence, urinary frequency, nocturnal urination.  Endo: Negative for unusual weight change.    Physical Examination:   BP (!) 147/95   Pulse 94   Temp (!) 96.9 F (36.1 C) (Oral)   Ht 5\' 8"  (1.727 m)   Wt 151 lb (68.5 kg)   BMI 22.96 kg/m   General: Well-nourished, well-developed in no acute distress.  Eyes: No icterus. Mouth: Oropharyngeal mucosa moist and pink , no lesions erythema or exudate.  Lungs: Clear to auscultation bilaterally.  Heart: Regular rate and rhythm, no murmurs rubs or gallops.  Abdomen: Bowel sounds are normal, nontender, nondistended, no hepatosplenomegaly or masses, no abdominal bruits or hernia , no rebound or guarding.   Extremities: No lower extremity edema. No clubbing or deformities. Neuro: Alert and oriented x 4   Skin: Warm and dry, no jaundice.   Psych: Alert and cooperative, normal mood and affect.  Labs:  Lab Results  Component Value Date   WBC 4.8 05/16/2019   HGB 12.0 (L) 05/16/2019   HCT 36.9 (L) 05/16/2019   MCV 83.9 05/16/2019   PLT 235 05/16/2019   Lab Results  Component Value Date   IRON 71 05/16/2019   TIBC 346 05/16/2019   FERRITIN 13 (L) 05/16/2019    Imaging Studies: No results found.

## 2019-06-15 NOTE — Telephone Encounter (Signed)
Spoke with pt's mother. He was on ASA 81mg  but his doctor changed him to 325 mg daily. Pt takes Iron 65mg  bid.   The pharmacy is faxing over the medications pt has filled in the last 4 months.

## 2019-06-15 NOTE — Telephone Encounter (Signed)
Please contact [facility] and find out what medications patient has had filled in the last 4 months.

## 2019-06-15 NOTE — Assessment & Plan Note (Addendum)
Persistent mild iron deficiency anemia.  EGD and colonoscopy up-to-date.  Trying to sort out his medication list.  Appears that he takes aspirin daily, pantoprazole 40 mg twice daily.  We have requested pharmacy records with plans to speak to mother who helps patient with medications.  Further recommendations to follow.  Repeat labs in 3 months.

## 2019-06-15 NOTE — Progress Notes (Signed)
Received updated med list from pharmacy and clarification on meds from mother. Medications updated for OV note.   Reviewed last 3 OV notes from Dr. Merlene Laughter. He was put on Reglan for possible gastroparesis.   PLEASE LET MOTHER KNOW I WOULD LIKE TO SIMPLIFY HIS GI MEDICATIONS.  LETS STOP CARAFATE FIRST. IF HE SEEMS TO DO OK AFTER TWO WEEKS OFF CARAFATE, THEN LETS TRY TO USE REGLAN ONLY AS NEEDED FOR N/V INSTEAD OF THREE TIMES PER DAY.   CONTINUE PANTOPRAZOLE BID. CONTINUE IRON BID.   WE WILL UPDATE HIS LABS IN 3 MONTHS.   RETURN OV IN SIX MONTHS.

## 2019-06-15 NOTE — Addendum Note (Signed)
Addended by: Mahala Menghini on: 06/15/2019 04:32 PM   Modules accepted: Orders

## 2019-06-16 NOTE — Telephone Encounter (Signed)
received

## 2019-06-16 NOTE — Progress Notes (Signed)
Unable to reach mom on either phone number.

## 2019-06-17 ENCOUNTER — Encounter (HOSPITAL_COMMUNITY): Payer: Self-pay | Admitting: Physical Therapy

## 2019-06-17 ENCOUNTER — Other Ambulatory Visit: Payer: Self-pay

## 2019-06-17 ENCOUNTER — Ambulatory Visit (HOSPITAL_COMMUNITY): Payer: Medicaid Other | Attending: Internal Medicine | Admitting: Physical Therapy

## 2019-06-17 DIAGNOSIS — R262 Difficulty in walking, not elsewhere classified: Secondary | ICD-10-CM

## 2019-06-17 DIAGNOSIS — R278 Other lack of coordination: Secondary | ICD-10-CM | POA: Diagnosis present

## 2019-06-17 DIAGNOSIS — R29898 Other symptoms and signs involving the musculoskeletal system: Secondary | ICD-10-CM | POA: Diagnosis present

## 2019-06-17 DIAGNOSIS — M25611 Stiffness of right shoulder, not elsewhere classified: Secondary | ICD-10-CM | POA: Insufficient documentation

## 2019-06-17 DIAGNOSIS — M6281 Muscle weakness (generalized): Secondary | ICD-10-CM | POA: Diagnosis present

## 2019-06-17 NOTE — Therapy (Signed)
[facility] [place], [place], [address] Phone: [phone]   Fax:  [phone]  Physical Therapy Evaluation  Patient Details  Name: Evan Chavez MRN: [id number] Date of Birth: 12/06/1960 Referring Provider (PT): Rosita Fire    Encounter Date: 06/17/2019  PT End of Session - 06/17/19 1146    Visit Number  1    Number of Visits  16    Date for PT Re-Evaluation  --   after visit # 4 medicaid reauthorization.   Authorization Type  medicaid put in    PT Start Time  1100    PT Stop Time  1135    PT Time Calculation (min)  35 min    Activity Tolerance  Patient tolerated treatment well    Behavior During Therapy  WFL for tasks assessed/performed       Past Medical History:  Diagnosis Date  . Cocaine abuse ([place])   . ETOH abuse   . GERD (gastroesophageal reflux disease)   . Hyperlipidemia   . 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 BIOPSY  08/25/2018   Procedure: BIOPSY;  Surgeon: [name], MD;  [facility];  Service: Endoscopy;;  . BIOPSY  12/13/2018   Procedure: BIOPSY;  Surgeon: [name], MD;  Location: [facility];  Service: Endoscopy;;  esophagus  . COLONOSCOPY WITH PROPOFOL N/A 04/07/2019   Dr. [name]: 3 Tubular adenomas removed (4 to 8 mm in size), next colonoscopy 3 years.  . ESOPHAGOGASTRODUODENOSCOPY (EGD) WITH PROPOFOL N/A 08/25/2018   Dr. [name]: Severely inflamed abnormal appearing mid/distal esophagus.  Encroachment on lumen suspicious for infiltrating neoplasm located be all from severe benign inflammation, extensive gastric erosions and extensive duodenal ulcerations.  Query ischemic process versus other.  Biopsy revealed Barrett's esophagus but negative for malignancy and H. pylori.  . ESOPHAGOGASTRODUODENOSCOPY (EGD) WITH PROPOFOL N/A 12/13/2018   Dr. [name]: peptic stricture s/p balloon dilatation. abnormal  esophageal mucosa, by c/w barrett's without dysplasia, previous PUD completely healed.   Marland Kitchen POLYPECTOMY  04/07/2019   Procedure: POLYPECTOMY;  Surgeon: [name], MD;  Location: [facility];  Service: Endoscopy;;  colon    There were no vitals filed for this visit.   Subjective Assessment - 06/17/19 1058    Subjective  [name] states that he had a stroke in 2019.  He only  Had  Therapy in the hospital after his stroke.   He has been doing sit to stand exercises to try and get stronger but he feels that he could do more.   He has been using a walker and would like to get to a cane if he is able.    Pertinent History  CVA 2019, HTN, substance abuse    Limitations  Walking;Sitting    How long can you sit comfortably?  no problem    How long can you stand comfortably?  for awhile but puts more wt on his left let    How long can you walk comfortably?  Walks with a walker  has walked 15-20 minutes with his walker.    Patient Stated Goals  He would like to be able to move around a lot better.    Currently in Pain?  No/denies         [facility] PT Assessment - 06/17/19 0001      Assessment   Medical Diagnosis  difficulty  walking     Referring Provider (PT)  Tesfaye Fanta     Onset Date/Surgical Date  05/15/18    Hand Dominance  Right    Next MD Visit  not scheduled     Prior Therapy  years ago only in the hospital       Precautions   Precautions  Fall      Restrictions   Weight Bearing Restrictions  No      Balance Screen   Has the patient fallen in the past 6 months  No    Has the patient had a decrease in activity level because of a fear of falling?   Yes    Is the patient reluctant to leave their home because of a fear of falling?   No      Home Environment   Living Environment  Private residence    Type of [place] Access  Level entry      Prior Function   Level of Independence  Independent with household mobility with device      Cognition   Overall  Cognitive Status  Within Functional Limits for tasks assessed      Functional Tests   Functional tests  Single leg stance;Sit to Stand      Single Leg Stance   Comments  Rt: 0; LT 1 "       Sit to Stand   Comments  5 sx 19.43       ROM / Strength   AROM / PROM / Strength  Strength      Strength   Strength Assessment Site  Hip;Knee;Ankle    Right/Left Hip  Right;Left    Right Hip Flexion  5/5    Right Hip Extension  2/5    Right Hip ABduction  3+/5    Left Hip Flexion  5/5    Left Hip Extension  5/5    Left Hip ABduction  5/5    Right/Left Knee  Right;Left    Right Knee Extension  3+/5    Left Knee Flexion  3/5    Left Knee Extension  5/5    Right/Left Ankle  Right;Left    Right Ankle Dorsiflexion  4/5    Left Ankle Dorsiflexion  5/5      Ambulation/Gait   Ambulation Distance (Feet)  246 Feet    Assistive device  Rolling walker    Gait Pattern  Right foot flat    Ambulation Surface  Level    Gait Comments  3 minute walk                 Objective measurements completed on examination: See above findings.              PT Education - 06/17/19 1145    Education Details  HEP    Person(s) Educated  Patient    Methods  Explanation;Verbal cues;Handout    Comprehension  Verbalized understanding       PT Short Term Goals - 06/17/19 1155      PT SHORT TERM GOAL #1   Title  PT to be I in a HEP to increase core and Rt LE strength to allow pt to be able to go up 3-5 steps with one hand assist    Time  3    Period  Weeks    Status  New    Target Date  07/07/19      PT SHORT TERM GOAL #  2   Title  Pt to be able to single leg stance on both LE for at least 10 seconds to decrease risk of falling    Time  3    Period  Weeks    Status  New        PT Long Term Goals - 06/17/19 1157      PT LONG TERM GOAL #1   Title  PT LE stength to have increased by one grade to allow pt to ambulate greater than 320 ft in three minutes using appropriate assistive  device.    Time  6    Period  Weeks    Status  New    Target Date  07/29/19      PT LONG TERM GOAL #2   Title  PT to be able to single leg stance for at least 15 seconds on both LE to allow pt to feel confident walking in his home with a single point cane.    Time  6    Period  Weeks    Status  New      PT LONG TERM GOAL #3   Title  PT core and LE strength to be improved to allow pt to be able to get in and out of lower chairs/ cars without difficulty.    Time  6    Period  Weeks    Status  New      PT LONG TERM GOAL #4   Title  PT to be able to go up and down  8-10(one flight) of steps with a cane and one hand hold.    Time  6    Period  Weeks    Status  New             Plan - 06/17/19 1148    Clinical Impression Statement  Mr. Lopes is a 58 yo male who had a CVA in 2019.  He had no therapy except for what was given in the hospital.  He has been doing what he could but feels that he could be walking and using his Rt arm better if he knew what he could do therefore he was referred to out-patient rehabilitation.  Evaluation demonstrates decreased strength and balance causing difficulty in walking.  Mr. Ocon will benefit from skilled PT to address these issues and maximize his functioning level.    Personal Factors and Comorbidities  Time since onset of injury/illness/exacerbation;Transportation;Social Background    Examination-Activity Limitations  Bathing;Bed Mobility;Carry;Dressing;Stairs;Squat;Locomotion Level;Lift    Examination-Participation Restrictions  Cleaning;Community Activity;Laundry;Yard Work    Stability/Clinical Decision Making  Stable/Uncomplicated    Clinical Decision Making  Low    Rehab Potential  Good    PT Frequency  2x / week    PT Duration  6 weeks    PT Treatment/Interventions  ADLs/Self Care Home Management;Gait training;Therapeutic exercise;Therapeutic activities;Manual techniques;Functional mobility training;Balance training    PT Next Visit Plan   Begin balance and WB strengthening activity.    PT Home Exercise Plan  LAQ, Sidelying hip abduction, prone knee flexion, prone hip extension, standing sit to stand and heel raises.       Patient will benefit from skilled therapeutic intervention in order to improve the following deficits and impairments:  Abnormal gait, Decreased activity tolerance, Decreased balance, Decreased strength, Difficulty walking, Decreased mobility  Visit Diagnosis: Muscle weakness (generalized) - Plan: PT plan of care cert/re-cert  Difficulty in walking, not elsewhere classified - Plan: PT plan of care cert/re-cert  Problem List Patient Active Problem List   Diagnosis Date Noted  . Barrett's esophagus 06/15/2019  . Peptic stricture of esophagus 03/30/2019  . Anemia 03/30/2019  . Abnormality of esophagus   . Gastritis and gastroduodenitis   . Gastroesophageal reflux disease with esophagitis   . Lobar pneumonia ([place]) 08/22/2018  . Sepsis due to undetermined organism ([place]) 08/22/2018  . Fever   . Precordial pain   . HCAP (healthcare-associated pneumonia) 08/21/2018  . Elevated transaminase level 08/21/2018  . Atrial tachycardia ([place]) 07/13/2018  . Dyslipidemia 07/13/2018  . Acute ischemic stroke ([place]) 07/12/2018  . Essential hypertension 07/12/2018  . Tobacco abuse 07/12/2018  . Cocaine abuse ([place]) 07/12/2018  . Ischemic stroke [facility])     Rayetta Humphrey, PT CLT [phone] 06/17/2019, 12:05 PM  [facility] [address] [place], [place], [address] Phone: [phone]   Fax:  [phone]  Name: Harlowe Varriale MRN: [id number] Date of Birth: [birth year]

## 2019-06-17 NOTE — Patient Instructions (Addendum)
Knee Extension (Sitting)    Place _2___ pound weight on left ankle and straighten knee fully, lower slowly. Repeat _10___ times per set. Do _1___ sets per session. Do 2____ sessions per day.  http://orth.exer.us/732   Copyright  VHI. All rights reserved.  Functional Quadriceps: Sit to Stand    Sit on edge of chair, feet flat on floor. Stand upright, extending knees fully. Repeat _10___ times per set. Do __1__ sets per session. Do ___2_ sessions per day.  http://orth.exer.us/734   Copyright  VHI. All rights reserved.  Heel Raise: Bilateral (Standing)   At the kitchen counter; hold onto the counter and  Rise on balls of feet. Repeat ___10_ times per set. Do 1____ sets per session. Do _2___ sessions per day.  http://orth.exer.us/38   Copyright  VHI. All rights reserved.  Strengthening: Hip Abduction (Side-Lying)    Tighten muscles on front of right thigh, then lift leg _15___ inches from surface, keeping knee locked.  Repeat __10__ times per set. Do __1__ sets per session. Do __2__ sessions per day.  http://orth.exer.us/622   Copyright  VHI. All rights reserved.  Self-Mobilization: Knee Flexion (Prone)    Bring rightt heel toward buttocks as close as possible. Hold _3___ seconds. Relax. Repeat _10___ times per set. Do _1___ sets per session. Do ___3_ sessions per day.  http://orth.exer.us/596   Copyright  VHI. All rights reserved.  Strengthening: Hip Extension (Prone)    Tighten muscles on front of left thigh, then lift leg _3___ inches from surface, keeping knee locked. Repeat ___10_ times per set. Do ___1_ sets per session. Do __2__ sessions per day.  http://orth.exer.us/620   Copyright  VHI. All rights reserved.

## 2019-06-21 NOTE — Progress Notes (Signed)
PT's mom is aware of the recommendation of the GI meds.  I have also printed it and mailed to her.

## 2019-06-23 ENCOUNTER — Telehealth (HOSPITAL_COMMUNITY): Payer: Self-pay | Admitting: Occupational Therapy

## 2019-06-23 ENCOUNTER — Ambulatory Visit (HOSPITAL_COMMUNITY): Payer: Medicaid Other | Admitting: Physical Therapy

## 2019-06-23 ENCOUNTER — Other Ambulatory Visit: Payer: Self-pay

## 2019-06-23 DIAGNOSIS — R262 Difficulty in walking, not elsewhere classified: Secondary | ICD-10-CM

## 2019-06-23 DIAGNOSIS — M6281 Muscle weakness (generalized): Secondary | ICD-10-CM | POA: Diagnosis not present

## 2019-06-23 NOTE — Therapy (Signed)
[facility] [address] [place], [place], [address] Phone: [phone]   Fax:  [phone]  Physical Therapy Treatment  Patient Details  Name: Evan Chavez MRN: [id number] Date of Birth: 22-Sep-1961 Referring Provider (PT): Rosita Fire    Encounter Date: 06/23/2019  PT End of Session - 06/23/19 1311    Visit Number  2    Number of Visits  16    Date for PT Re-Evaluation  06/30/19   after visit # 4 medicaid reauthorization.   Authorization Type  3 visits authorized from 9/10 thru 9/23    Authorization - Visit Number  1    Authorization - Number of Visits  4    PT Start Time  1000    PT Stop Time  1050    PT Time Calculation (min)  50 min    Equipment Utilized During Treatment  Gait belt    Activity Tolerance  Patient tolerated treatment well;Patient limited by lethargy    Behavior During Therapy  WFL for tasks assessed/performed       Past Medical History:  Diagnosis Date  . Cocaine abuse ([place])   . ETOH abuse   . GERD (gastroesophageal reflux disease)   . Hyperlipidemia   . Stroke (cerebrum) ([place]) 06/2018   right sided weakness    Past Surgical History:  Procedure Laterality Date  . BIOPSY  08/25/2018   Procedure: BIOPSY;  Surgeon: [name], MD;  [facility];  Service: Endoscopy;;  . BIOPSY  12/13/2018   Procedure: BIOPSY;  Surgeon: [name], MD;  [facility];  Service: Endoscopy;;  esophagus  . COLONOSCOPY WITH PROPOFOL N/A 04/07/2019   Dr. [name]: 3 Tubular adenomas removed (4 to 8 mm in size), next colonoscopy 3 years.  . ESOPHAGOGASTRODUODENOSCOPY (EGD) WITH PROPOFOL N/A 08/25/2018   Dr. [name]: Severely inflamed abnormal appearing mid/distal esophagus.  Encroachment on lumen suspicious for infiltrating neoplasm located be all from severe benign inflammation, extensive gastric erosions and extensive duodenal ulcerations.  Query ischemic process versus other.  Biopsy revealed  Barrett's esophagus but negative for malignancy and H. pylori.  . ESOPHAGOGASTRODUODENOSCOPY (EGD) WITH PROPOFOL N/A 12/13/2018   Dr. [name]: peptic stricture s/p balloon dilatation. abnormal esophageal mucosa, by c/w barrett's without dysplasia, previous PUD completely healed.   Marland Kitchen POLYPECTOMY  04/07/2019   Procedure: POLYPECTOMY;  Surgeon: [name], MD;  [facility];  Service: Endoscopy;;  colon    There were no vitals filed for this visit.  Subjective Assessment - 06/23/19 1042    Subjective  PT states that he has not done any of his exercises.  Wants his hand and arm to be treated.    Pertinent History  CVA 2019, HTN, substance abuse    Limitations  Walking;Sitting    How long can you sit comfortably?  no problem    How long can you stand comfortably?  for awhile but puts more wt on his left let    How long can you walk comfortably?  Walks with a walker  has walked 15-20 minutes with his walker.    Patient Stated Goals  He would like to be able to move around a lot better.              [facility] Adult PT Treatment/Exercise - 06/23/19 0001      Ambulation/Gait   Ambulation Distance (Feet)  246 Feet    Assistive device  Straight cane  Exercises   Exercises  Knee/Hip      Knee/Hip Exercises: Aerobic   Nustep  hills 3, level 4 x 7'      Knee/Hip Exercises: Standing   Heel Raises  Both;10 reps;Limitations    Heel Raises Limitations  no hands       Knee/Hip Exercises: Seated   Sit to Sand  10 reps          Balance Exercises - 06/23/19 1044      Balance Exercises: Standing   Standing Eyes Opened  Narrow base of support (BOS);Head turns;Foam/compliant surface;5 reps    Tandem Stance  Eyes open;Foam/compliant surface;3 reps    Marching Limitations  10    Other Standing Exercises  side step with green t band x 2           PT Short Term Goals - 06/23/19 1304      PT SHORT TERM GOAL #1   Title  PT to be I in a HEP to increase core and Rt LE  strength to allow pt to be able to go up 3-5 steps with one hand assist    Time  3    Period  Weeks    Status  On-going    Target Date  07/07/19      PT SHORT TERM GOAL #2   Title  Pt to be able to single leg stance on both LE for at least 10 seconds to decrease risk of falling    Time  3    Period  Weeks    Status  On-going        PT Long Term Goals - 06/23/19 1046      PT LONG TERM GOAL #1   Title  PT LE stength to have increased by one grade to allow pt to ambulate greater than 320 ft in three minutes using appropriate assistive device.    Time  6    Period  Weeks    Status  On-going      PT LONG TERM GOAL #2   Title  PT to be able to single leg stance for at least 15 seconds on both LE to allow pt to feel confident walking in his home with a single point cane.    Time  6    Period  Weeks    Status  On-going      PT LONG TERM GOAL #3   Title  PT core and LE strength to be improved to allow pt to be able to get in and out of lower chairs/ cars without difficulty.    Time  6    Period  Weeks    Status  On-going      PT LONG TERM GOAL #4   Title  PT to be able to go up and down  8-10(one flight) of steps with a cane and one hand hold.    Time  6    Period  Weeks    Status  On-going            Plan - 06/23/19 1301    Clinical Impression Statement  Reviewed goals and evaluation with patient.  Stressed to pt that he needs to be completing his HEP.  Pt gait trained with cane with good form, occasional verbal cuing needed to pick up and his Rt foot for improved clearance.  PT slightly fearful but able to complete balance exercises with contact gaurd assist.    Personal Factors and Comorbidities  Time since onset of injury/illness/exacerbation;Transportation;Social Background    Examination-Activity Limitations  Bathing;Bed Mobility;Carry;Dressing;Stairs;Squat;Locomotion Level;Lift    Examination-Participation Restrictions  Cleaning;Community Activity;Laundry;Yard Work     Stability/Clinical Decision Making  Stable/Uncomplicated    Rehab Potential  Good    PT Frequency  2x / week    PT Duration  6 weeks    PT Treatment/Interventions  ADLs/Self Care Home Management;Gait training;Therapeutic exercise;Therapeutic activities;Manual techniques;Functional mobility training;Balance training    PT Next Visit Plan  begin stepping over hurdles , retro gt as well as lunges.    PT Home Exercise Plan  LAQ, Sidelying hip abduction, prone knee flexion, prone hip extension, standing sit to stand and heel raises.       Patient will benefit from skilled therapeutic intervention in order to improve the following deficits and impairments:  Abnormal gait, Decreased activity tolerance, Decreased balance, Decreased strength, Difficulty walking, Decreased mobility  Visit Diagnosis: Muscle weakness (generalized)  Difficulty in walking, not elsewhere classified     Problem List Patient Active Problem List   Diagnosis Date Noted  . Barrett's esophagus 06/15/2019  . Peptic stricture of esophagus 03/30/2019  . Anemia 03/30/2019  . Abnormality of esophagus   . Gastritis and gastroduodenitis   . Gastroesophageal reflux disease with esophagitis   . Lobar pneumonia ([place]) 08/22/2018  . Sepsis due to undetermined organism ([place]) 08/22/2018  . Fever   . Precordial pain   . HCAP (healthcare-associated pneumonia) 08/21/2018  . Elevated transaminase level 08/21/2018  . Atrial tachycardia ([place]) 07/13/2018  . Dyslipidemia 07/13/2018  . Acute ischemic stroke ([place]) 07/12/2018  . Essential hypertension 07/12/2018  . Tobacco abuse 07/12/2018  . Cocaine abuse ([place]) 07/12/2018  . Ischemic stroke [facility])   Rayetta Humphrey, PT CLT [phone] 06/23/2019, 1:12 PM  [facility] [address] [place], [place], [address] Phone: [phone]   Fax:  [phone]  Name: Evan Chavez MRN: [id number] Date of Birth: Jun 14, 1961

## 2019-06-23 NOTE — Telephone Encounter (Signed)
Mom is aware of this OT eval apptment date  9/16/202 and time.

## 2019-06-28 ENCOUNTER — Encounter (HOSPITAL_COMMUNITY): Payer: Self-pay | Admitting: Physical Therapy

## 2019-06-28 ENCOUNTER — Ambulatory Visit (HOSPITAL_COMMUNITY): Payer: Medicaid Other | Admitting: Physical Therapy

## 2019-06-28 ENCOUNTER — Other Ambulatory Visit: Payer: Self-pay

## 2019-06-28 DIAGNOSIS — R262 Difficulty in walking, not elsewhere classified: Secondary | ICD-10-CM

## 2019-06-28 DIAGNOSIS — M6281 Muscle weakness (generalized): Secondary | ICD-10-CM | POA: Diagnosis not present

## 2019-06-28 NOTE — Therapy (Signed)
[facility] [place], [place], [address] Phone: [phone]   Fax:  [phone]  Physical Therapy Treatment  Patient Details  Name: Evan Chavez MRN: [id number] Date of Birth: 11-16-1960 Referring Provider (PT): Rosita Fire    Encounter Date: 06/28/2019  PT End of Session - 06/28/19 1311    Visit Number  3    Number of Visits  16    Date for PT Re-Evaluation  06/30/19   after visit # 4 medicaid reauthorization.   Authorization Type  3 visits authorized from 9/10 thru 9/23    Authorization - Visit Number  2    Authorization - Number of Visits  3    PT Start Time  [id number]    PT Stop Time  1358    PT Time Calculation (min)  43 min    Equipment Utilized During Treatment  Gait belt    Activity Tolerance  Patient tolerated treatment well;Patient limited by lethargy    Behavior During Therapy  WFL for tasks assessed/performed       Past Medical History:  Diagnosis Date  . Cocaine abuse ([place])   . ETOH abuse   . GERD (gastroesophageal reflux disease)   . Hyperlipidemia   . Stroke (cerebrum) ([place]) 06/2018   right sided weakness    Past Surgical History:  Procedure Laterality Date  . BIOPSY  08/25/2018   Procedure: BIOPSY;  Surgeon: [name], MD;  [facility];  Service: Endoscopy;;  . BIOPSY  12/13/2018   Procedure: BIOPSY;  Surgeon: [name], MD;  [facility];  Service: Endoscopy;;  esophagus  . COLONOSCOPY WITH PROPOFOL N/A 04/07/2019   Dr. [name]: 3 Tubular adenomas removed (4 to 8 mm in size), next colonoscopy 3 years.  . ESOPHAGOGASTRODUODENOSCOPY (EGD) WITH PROPOFOL N/A 08/25/2018   Dr. [name]: Severely inflamed abnormal appearing mid/distal esophagus.  Encroachment on lumen suspicious for infiltrating neoplasm located be all from severe benign inflammation, extensive gastric erosions and extensive duodenal ulcerations.  Query ischemic process versus other.  Biopsy revealed  Barrett's esophagus but negative for malignancy and H. pylori.  . ESOPHAGOGASTRODUODENOSCOPY (EGD) WITH PROPOFOL N/A 12/13/2018   Dr. [name]: peptic stricture s/p balloon dilatation. abnormal esophageal mucosa, by c/w barrett's without dysplasia, previous PUD completely healed.   Marland Kitchen POLYPECTOMY  04/07/2019   Procedure: POLYPECTOMY;  Surgeon: [name], MD;  [facility];  Service: Endoscopy;;  colon    There were no vitals filed for this visit.  Subjective Assessment - 06/28/19 1310    Subjective  No difficulties since last session. Able to do his exercises, states raising his legs together is difficult. No current pain    Pertinent History  CVA 2019, HTN, substance abuse    Limitations  Walking;Sitting    How long can you sit comfortably?  no problem    How long can you stand comfortably?  for awhile but puts more wt on his left let    How long can you walk comfortably?  Walks with a walker  has walked 15-20 minutes with his walker.    Patient Stated Goals  He would like to be able to move around a lot better.    Currently in Pain?  No/denies                       [facility] Adult PT Treatment/Exercise - 06/28/19 0001      Ambulation/Gait  Gait Comments  retro walking with 2 hand assist x6 15 ft each, RW with cues to perform faster pace( no pause) cues to perform heel strike and upright posture- 4x50 ft       Knee/Hip Exercises: Standing   Lateral Step Up  Both;3 sets;10 reps;Hand Hold: 2;Step Height: 4"    Forward Step Up  Both;3 sets;5 sets;Hand Hold: 0;Step Height: 4"    Other Standing Knee Exercises  lateral stepping on blue line for visual cue x6 Bilat - trying to keep foot straight and toes on line    Other Standing Knee Exercises  steps up with R LE and R UE, down with L UE and R LE 1x10 ---> up with RU and RL down with L LE and L UE x10 SBA                PT Short Term Goals - 06/23/19 1304      PT SHORT TERM GOAL #1   Title  PT to be I in  a HEP to increase core and Rt LE strength to allow pt to be able to go up 3-5 steps with one hand assist    Time  3    Period  Weeks    Status  On-going    Target Date  07/07/19      PT SHORT TERM GOAL #2   Title  Pt to be able to single leg stance on both LE for at least 10 seconds to decrease risk of falling    Time  3    Period  Weeks    Status  On-going        PT Long Term Goals - 06/23/19 1046      PT LONG TERM GOAL #1   Title  PT LE stength to have increased by one grade to allow pt to ambulate greater than 320 ft in three minutes using appropriate assistive device.    Time  6    Period  Weeks    Status  On-going      PT LONG TERM GOAL #2   Title  PT to be able to single leg stance for at least 15 seconds on both LE to allow pt to feel confident walking in his home with a single point cane.    Time  6    Period  Weeks    Status  On-going      PT LONG TERM GOAL #3   Title  PT core and LE strength to be improved to allow pt to be able to get in and out of lower chairs/ cars without difficulty.    Time  6    Period  Weeks    Status  On-going      PT LONG TERM GOAL #4   Title  PT to be able to go up and down  8-10(one flight) of steps with a cane and one hand hold.    Time  6    Period  Weeks    Status  On-going            Plan - 06/28/19 1344    Clinical Impression Statement  Patient tolerated session well. Focused on precision of movements and overall coordination. This was difficult for patient and he needed UE assist to perform movements corrently. Verbal cues of "keeping foot straight" and "lead with heel" were very helpful. Worked to stair goal with step ups/downs today which patient did well with. Will continue to  work on functional strength and coordination. Patient would continue to benefit from skilled physical therapy to improve overall function and balance.    Personal Factors and Comorbidities  Time since onset of  injury/illness/exacerbation;Transportation;Social Background    Examination-Activity Limitations  Bathing;Bed Mobility;Carry;Dressing;Stairs;Squat;Locomotion Level;Lift    Examination-Participation Restrictions  Cleaning;Community Activity;Laundry;Yard Work    Stability/Clinical Decision Making  Stable/Uncomplicated    Rehab Potential  Good    PT Frequency  2x / week    PT Duration  6 weeks    PT Treatment/Interventions  ADLs/Self Care Home Management;Gait training;Therapeutic exercise;Therapeutic activities;Manual techniques;Functional mobility training;Balance training    PT Next Visit Plan  request more visits -PN and check goals- begin stepping over hurdles, lunges, work on coordination/precision of movemen, SLS and go up/down steps with one UE assist    PT Home Exercise Plan  LAQ, Sidelying hip abduction, prone knee flexion, prone hip extension, standing sit to stand and heel raises.       Patient will benefit from skilled therapeutic intervention in order to improve the following deficits and impairments:  Abnormal gait, Decreased activity tolerance, Decreased balance, Decreased strength, Difficulty walking, Decreased mobility  Visit Diagnosis: Muscle weakness (generalized)  Difficulty in walking, not elsewhere classified     Problem List Patient Active Problem List   Diagnosis Date Noted  . Barrett's esophagus 06/15/2019  . Peptic stricture of esophagus 03/30/2019  . Anemia 03/30/2019  . Abnormality of esophagus   . Gastritis and gastroduodenitis   . Gastroesophageal reflux disease with esophagitis   . Lobar pneumonia ([place]) 08/22/2018  . Sepsis due to undetermined organism ([place]) 08/22/2018  . Fever   . Precordial pain   . HCAP (healthcare-associated pneumonia) 08/21/2018  . Elevated transaminase level 08/21/2018  . Atrial tachycardia ([place]) 07/13/2018  . Dyslipidemia 07/13/2018  . Acute ischemic stroke ([place]) 07/12/2018  . Essential hypertension 07/12/2018  . Tobacco  abuse 07/12/2018  . Cocaine abuse ([place]) 07/12/2018  . Ischemic stroke ([place])     3:03 PM, 06/28/19 Jerene Pitch, DPT Physical Therapy with [facility]  [phone] office   [facility] [address] [place], [place], [address] Phone: [phone]   Fax:  [phone]  Name: Evan Chavez MRN: [id number] Date of Birth: 1961-08-16

## 2019-06-29 ENCOUNTER — Ambulatory Visit (HOSPITAL_COMMUNITY): Payer: Medicaid Other

## 2019-06-29 ENCOUNTER — Encounter (HOSPITAL_COMMUNITY): Payer: Self-pay

## 2019-06-30 ENCOUNTER — Telehealth (HOSPITAL_COMMUNITY): Payer: Self-pay | Admitting: Physical Therapy

## 2019-06-30 ENCOUNTER — Ambulatory Visit (HOSPITAL_COMMUNITY): Payer: Medicaid Other | Admitting: Physical Therapy

## 2019-06-30 ENCOUNTER — Other Ambulatory Visit: Payer: Self-pay

## 2019-06-30 DIAGNOSIS — M6281 Muscle weakness (generalized): Secondary | ICD-10-CM | POA: Diagnosis not present

## 2019-06-30 DIAGNOSIS — R262 Difficulty in walking, not elsewhere classified: Secondary | ICD-10-CM

## 2019-06-30 NOTE — Therapy (Signed)
[facility] [address] [place], [place], [address] Phone: [phone]   Fax:  [phone]  Physical Therapy Treatment  Progress Note Reporting Period 06/17/19 to 06/30/19  See note below for Objective Data and Assessment of Progress/Goals.   Addiitional 12 visits requested from 07/07/19 to 08/18/19  Patient Details  Name: Evan Chavez MRN: [id number] Date of Birth: [birth year] Referring Provider (PT): Rosita Fire    Encounter Date: 06/30/2019  PT End of Session - 06/30/19 1314    Visit Number  4    Number of Visits  16    Date for PT Re-Evaluation  06/30/19   after visit # 4 medicaid reauthorization.   Authorization Type  3 visits authorized from 9/10 thru 9/23    Authorization - Visit Number  1    Authorization - Number of Visits  10    PT Start Time  9509    PT Stop Time  1400    PT Time Calculation (min)  45 min    Equipment Utilized During Treatment  Gait belt    Activity Tolerance  Patient tolerated treatment well;Patient limited by lethargy    Behavior During Therapy  WFL for tasks assessed/performed       Past Medical History:  Diagnosis Date  . Cocaine abuse ([place])   . ETOH abuse   . GERD (gastroesophageal reflux disease)   . Hyperlipidemia   . Stroke (cerebrum) ([place]) 06/2018   right sided weakness    Past Surgical History:  Procedure Laterality Date  . BIOPSY  08/25/2018   Procedure: BIOPSY;  Surgeon: [name], MD;  [facility];  Service: Endoscopy;;  . BIOPSY  12/13/2018   Procedure: BIOPSY;  Surgeon: [name], MD;  [facility];  Service: Endoscopy;;  esophagus  . COLONOSCOPY WITH PROPOFOL N/A 04/07/2019   Dr. [name]: 3 Tubular adenomas removed (4 to 8 mm in size), next colonoscopy 3 years.  . ESOPHAGOGASTRODUODENOSCOPY (EGD) WITH PROPOFOL N/A 08/25/2018   Dr. [name]: Severely inflamed abnormal appearing mid/distal esophagus.  Encroachment on lumen suspicious for  infiltrating neoplasm located be all from severe benign inflammation, extensive gastric erosions and extensive duodenal ulcerations.  Query ischemic process versus other.  Biopsy revealed Barrett's esophagus but negative for malignancy and H. pylori.  . ESOPHAGOGASTRODUODENOSCOPY (EGD) WITH PROPOFOL N/A 12/13/2018   Dr. [name]: peptic stricture s/p balloon dilatation. abnormal esophageal mucosa, by c/w barrett's without dysplasia, previous PUD completely healed.   Marland Kitchen POLYPECTOMY  04/07/2019   Procedure: POLYPECTOMY;  Surgeon: [name], MD;  [facility];  Service: Endoscopy;;  colon    There were no vitals filed for this visit.  Subjective Assessment - 06/30/19 1317    Subjective  No pain or difficulties with exercises at home since last session. States he was a little tired and sore after last session and is still a little sore.    Pertinent History  CVA 2019, HTN, substance abuse    Limitations  Walking;Sitting    How long can you sit comfortably?  no problem    How long can you stand comfortably?  for awhile but puts more wt on his left let    How long can you walk comfortably?  Walks with a walker  has walked 15-20 minutes with his walker.    Patient Stated Goals  He would like to be able to move around a lot better.    Currently in Pain?  No/denies  [facility] PT Assessment - 06/30/19 1324      Assessment   Medical Diagnosis  difficulty walking     Referring Provider (PT)  Tesfaye Fanta     Onset Date/Surgical Date  05/15/18    Hand Dominance  Right    Next MD Visit  not scheduled     Prior Therapy  years ago only in the hospital       Precautions   Precautions  Fall      Restrictions   Weight Bearing Restrictions  No      Functional Tests   Functional tests  Single leg stance;Sit to Stand      Single Leg Stance   Comments  Rt: 0 sec no UE assist, 30sec with one finger assist, L 6 seconds with no UE assist      Ambulation/Gait   Ambulation Distance  (Feet)  298 Feet    Assistive device  Rolling walker    Gait Pattern  Decreased stance time - right    Ambulation Surface  Level    Gait Comments  3 minute walk           [facility] Adult PT Treatment/Exercise - 06/30/19 1324      Knee/Hip Exercises: Standing   Lateral Step Up  3 sets;5 reps;Hand Hold: 2;Step Height: 4"   at steps, Bil   Forward Step Up  Both;4 sets;5 reps;Hand Hold: 1;Step Height: 4"   at stairs         Balance Exercises - 06/30/19 1353      Balance Exercises: Standing   SLS  Eyes open;Solid surface;5 reps;30 secs   bilat, - one finger supportwith R LE balance, occ assist L         PT Short Term Goals - 06/30/19 1335      PT SHORT TERM GOAL #1   Title  PT to be I in a HEP to increase core and Rt LE strength to allow pt to be able to go up 3-5 steps with one hand assist    Baseline  able to perform on 5 inch steps, unable to perform on standard steps    Time  3    Period  Weeks    Status  Partially Met    Target Date  07/07/19      PT SHORT TERM GOAL #2   Title  Pt to be able to single leg stance on both LE for at least 10 seconds to decrease risk of falling    Baseline  Rt: 0, Lt 6 seconds    Time  3    Period  Weeks    Status  On-going        PT Long Term Goals - 06/30/19 1336      PT LONG TERM GOAL #1   Title  PT LE stength to have increased by one grade to allow pt to ambulate greater than 320 ft in three minutes using appropriate assistive device.    Baseline  9/17 298 ft with RW    Time  6    Period  Weeks    Status  On-going      PT LONG TERM GOAL #2   Title  PT to be able to single leg stance for at least 15 seconds on both LE to allow pt to feel confident walking in his home with a single point cane.    Time  6    Period  Weeks    Status  On-going      PT LONG TERM GOAL #3   Title  PT core and LE strength to be improved to allow pt to be able to get in and out of lower chairs/ cars without difficulty.    Time  6    Period   Weeks    Status  On-going      PT LONG TERM GOAL #4   Title  PT to be able to go up and down  8-10(one flight) of steps with a cane and one hand hold.    Time  6    Period  Weeks    Status  On-going            Plan - 06/30/19 1411    Clinical Impression Statement  Patient present for a reassessment on this date. He has been treated for 3 treatment visits since his evaluation and is already starting to make progress in balance and functional endurance. Patient has partially met 2/2 short term goals and is working towards his long term goals. Able to ascend and descend 5 steps of 5 inch height today with railing with step through gait pattern but unable to perform on standard step height. Improved left lower extremity balance with SLS, but right SLS is still challenging. Patient will continue to benefit from skilled physical therapy to work towards functional goals and decrease risk of falling at home and in the community.    Personal Factors and Comorbidities  Time since onset of injury/illness/exacerbation;Transportation;Social Background    Examination-Activity Limitations  Bathing;Bed Mobility;Carry;Dressing;Stairs;Squat;Locomotion Level;Lift    Examination-Participation Restrictions  Cleaning;Community Activity;Laundry;Yard Work    Stability/Clinical Decision Making  Stable/Uncomplicated    Rehab Potential  Good    PT Frequency  2x / week    PT Duration  6 weeks    PT Treatment/Interventions  ADLs/Self Care Home Management;Gait training;Therapeutic exercise;Therapeutic activities;Manual techniques;Functional mobility training;Balance training    PT Next Visit Plan  work on coordination/precision of movement and functional strength/endurance, SLS and go up/down steps with one UE assist. Requesting additional 12 visits per POC.    PT Home Exercise Plan  LAQ, Sidelying hip abduction, prone knee flexion, prone hip extension, standing sit to stand and heel raises.    Consulted and Agree  with Plan of Care  Patient       Patient will benefit from skilled therapeutic intervention in order to improve the following deficits and impairments:  Abnormal gait, Decreased activity tolerance, Decreased balance, Decreased strength, Difficulty walking, Decreased mobility  Visit Diagnosis: Muscle weakness (generalized)  Difficulty in walking, not elsewhere classified     Problem List Patient Active Problem List   Diagnosis Date Noted  . Barrett's esophagus 06/15/2019  . Peptic stricture of esophagus 03/30/2019  . Anemia 03/30/2019  . Abnormality of esophagus   . Gastritis and gastroduodenitis   . Gastroesophageal reflux disease with esophagitis   . Lobar pneumonia ([place]) 08/22/2018  . Sepsis due to undetermined organism ([place]) 08/22/2018  . Fever   . Precordial pain   . HCAP (healthcare-associated pneumonia) 08/21/2018  . Elevated transaminase level 08/21/2018  . Atrial tachycardia ([place]) 07/13/2018  . Dyslipidemia 07/13/2018  . Acute ischemic stroke ([place]) 07/12/2018  . Essential hypertension 07/12/2018  . Tobacco abuse 07/12/2018  . Cocaine abuse ([place]) 07/12/2018  . Ischemic stroke ([place])    2:25 PM, 06/30/19 Jerene Pitch, DPT Physical Therapy with [facility]  [phone] office  [facility]  [place], [place], [address] Phone: [phone]   Fax:  [phone]  Name: Evan Chavez MRN: [id number] Date of Birth: 11-18-1960

## 2019-06-30 NOTE — Telephone Encounter (Signed)
S/w mom and she understand to cx Tues 9/22 and confrime OT eval on 9/23 with her

## 2019-07-01 ENCOUNTER — Encounter (HOSPITAL_COMMUNITY): Payer: Medicaid Other

## 2019-07-05 ENCOUNTER — Ambulatory Visit (HOSPITAL_COMMUNITY): Payer: Medicaid Other

## 2019-07-06 ENCOUNTER — Other Ambulatory Visit: Payer: Self-pay

## 2019-07-06 ENCOUNTER — Ambulatory Visit (HOSPITAL_COMMUNITY): Payer: Medicaid Other

## 2019-07-06 ENCOUNTER — Encounter (HOSPITAL_COMMUNITY): Payer: Self-pay

## 2019-07-06 DIAGNOSIS — R278 Other lack of coordination: Secondary | ICD-10-CM

## 2019-07-06 DIAGNOSIS — M25611 Stiffness of right shoulder, not elsewhere classified: Secondary | ICD-10-CM

## 2019-07-06 DIAGNOSIS — M6281 Muscle weakness (generalized): Secondary | ICD-10-CM | POA: Diagnosis not present

## 2019-07-06 DIAGNOSIS — R29898 Other symptoms and signs involving the musculoskeletal system: Secondary | ICD-10-CM

## 2019-07-06 NOTE — Therapy (Signed)
[facility] [place], [place], [address] Phone: [phone]   Fax:  [phone]  Occupational Therapy Evaluation  Patient Details  Name: Evan Chavez MRN: [id number] Date of Birth: [birth year] Referring Provider (OT): Dr. Rosita Fire   Encounter Date: 07/06/2019  OT End of Session - 07/06/19 1535    Visit Number  1    Number of Visits  8    Date for OT Re-Evaluation  08/03/19    Authorization Type  Medicaid - requesting 3 visits on 07/06/19    OT Start Time  1350   Pt arrived late   OT Stop Time  77    OT Time Calculation (min)  33 min    Activity Tolerance  Patient tolerated treatment well    Behavior During Therapy  [facility] for tasks assessed/performed       Past Medical History:  Diagnosis Date  . Cocaine abuse ([name])   . ETOH abuse   . GERD (gastroesophageal reflux disease)   . Hyperlipidemia   . Stroke (cerebrum) ([place]) 06/2018   right sided weakness    Past Surgical History:  Procedure Laterality Date  . BIOPSY  08/25/2018   Procedure: BIOPSY;  Surgeon: [name], MD;  [facility];  Service: Endoscopy;;  . BIOPSY  12/13/2018   Procedure: BIOPSY;  Surgeon: [name], MD;  [facility];  Service: Endoscopy;;  esophagus  . COLONOSCOPY WITH PROPOFOL N/A 04/07/2019   Dr. [name]: 3 Tubular adenomas removed (4 to 8 mm in size), next colonoscopy 3 years.  . ESOPHAGOGASTRODUODENOSCOPY (EGD) WITH PROPOFOL N/A 08/25/2018   Dr. [name]: Severely inflamed abnormal appearing mid/distal esophagus.  Encroachment on lumen suspicious for infiltrating neoplasm located be all from severe benign inflammation, extensive gastric erosions and extensive duodenal ulcerations.  Query ischemic process versus other.  Biopsy revealed Barrett's esophagus but negative for malignancy and H. pylori.  . ESOPHAGOGASTRODUODENOSCOPY (EGD) WITH PROPOFOL N/A 12/13/2018   Dr. [name]: peptic stricture s/p balloon  dilatation. abnormal esophageal mucosa, by c/w barrett's without dysplasia, previous PUD completely healed.   Marland Kitchen POLYPECTOMY  04/07/2019   Procedure: POLYPECTOMY;  Surgeon: [name], MD;  [facility];  Service: Endoscopy;;  colon    There were no vitals filed for this visit.  Subjective Assessment - 07/06/19 1355    Subjective   S:I can't reach up very high.    Pertinent History  Patient is a 58 y/o male S/P right shoulder weakness which was sustained from a CVA in 2019. patient received OT and PT services while in the hospital only. He did now receiving outpatient PT services at this clinic. Dr. [name] has referred patient to occupational therapy for evaluation and treatment.    Patient Stated Goals  To be able to reach better with my arm.    Currently in Pain?  Yes    Pain Score  5     Pain Location  Shoulder    Pain Orientation  Right    Pain Descriptors / Indicators  Sore    Pain Type  Acute pain    Pain Radiating Towards  N/A    Pain Onset  More than a month ago    Pain Frequency  Intermittent    Aggravating Factors   nothing    Pain Relieving Factors  nothing    Effect of Pain on Daily Activities  Pt pushes through the pain.  [facility] OT Assessment - 07/06/19 1357      Assessment   Medical Diagnosis  right side weakness    Referring Provider (OT)  Dr. Rosita Fire    Onset Date/Surgical Date  05/15/18    Hand Dominance  Right    Next MD Visit  not scheduled     Prior Therapy  Pt received OT and PT in acute care only.      Precautions   Precautions  Fall      Restrictions   Weight Bearing Restrictions  No      Balance Screen   Has the patient fallen in the past 6 months  No      Home  Environment   Family/patient expects to be discharged to:  Private residence    Living Arrangements  Children    Available Help at Discharge  Family      Prior Function   Level of Independence  Independent with household mobility with device    Vocation   Unemployed      ADL   ADL comments  pt reports that he is completing all daily tasks without assistance. Would like to be able to reach his right shoulder overhead.       Mobility   Mobility Status  Independent      Written Expression   Dominant Hand  Right      Vision - History   Baseline Vision  No visual deficits      Cognition   Overall Cognitive Status  Within Functional Limits for tasks assessed      Observation/Other Assessments   Focus on Therapeutic Outcomes (FOTO)   N/A      Coordination   9 Hole Peg Test  Right;Left    Right 9 Hole Peg Test  56.4"    Left 9 Hole Peg Test  32.1"      ROM / Strength   AROM / PROM / Strength  AROM;PROM;Strength      AROM   Overall AROM Comments  Assessed seated. IR/er adducted    AROM Assessment Site  Shoulder    Right/Left Shoulder  Right    Right Shoulder Flexion  95 Degrees    Right Shoulder ABduction  72 Degrees    Right Shoulder Internal Rotation  90 Degrees    Right Shoulder External Rotation  32 Degrees      PROM   Overall PROM Comments  Will be measured at next session. Unable to obtain due to time constraint.       Strength   Overall Strength Comments  Measured seated. IR/er adducted.     Strength Assessment Site  Shoulder;Hand;Elbow    Right/Left Shoulder  Right    Right Shoulder Flexion  3-/5    Right Shoulder ABduction  3-/5    Right Shoulder Internal Rotation  5/5    Right Shoulder External Rotation  3+/5    Right/Left Elbow  Right    Right Elbow Flexion  5/5    Right Elbow Extension  5/5    Right/Left hand  Right;Left    Right Hand Grip (lbs)  45    Right Hand Lateral Pinch  9 lbs    Right Hand 3 Point Pinch  7 lbs    Left Hand Grip (lbs)  90    Left Hand Lateral Pinch  22 lbs    Left Hand 3 Point Pinch  16 lbs  OT Education - 07/06/19 1534    Education Details  yellow thereputty- grip and pinch. Table slides - flexion and abduction    Person(s) Educated  Patient     Methods  Explanation;Demonstration;Verbal cues;Handout    Comprehension  Verbalized understanding       OT Short Term Goals - 07/06/19 1544      OT SHORT TERM GOAL #1   Title  Patient will be educated and independent with HEP with reports of completing 1-2 times a day in order to faciliate progress in therapy and allow patient increased use of his RUE during daily tasks.    Time  2    Period  Weeks    Status  New    Target Date  07/20/19        OT Long Term Goals - 07/06/19 1548      OT LONG TERM GOAL #1   Title  Patient will increase his right shoulder Passive ROM to [facility] in order to complete reaching tasks at shoulder level with less difficulty.    Time  4    Period  Weeks    Status  New    Target Date  08/03/19      OT LONG TERM GOAL #2   Title  Patient will increase his right grip strength by 15# and his pinch strength by 6# in order to be able to hold onto items without dropping.    Time  4    Period  Weeks    Status  New      OT LONG TERM GOAL #3   Title  Patient will increase his right shoulder strength to 4/5 overall in order to be able to lift and handle normal household items with less difficulty.    Time  4    Period  Weeks    Status  New      OT LONG TERM GOAL #4   Title  Patient will increase his right hand coordination by completing the 9 hole peg test in 40 seconds or less in order to return to using his right hand as his dominant extremity.    Time  4    Period  Weeks    Status  New      OT LONG TERM GOAL #5   Title  *Create goal for right shoulder A/ROM if needed.*            Plan - 07/06/19 1539    Clinical Impression Statement  A: Patient is a 58 y/o male S/P right side weakness due to CVA causing decreased shoulder ROM, decreased shoulder strength, decreased grip and pinch strength, and decreased coordination resulting in difficulty completing his daily tasks while using his RUE as his dominant extremity.    OT Occupational Profile and  History  Problem Focused Assessment - Including review of records relating to presenting problem    Occupational performance deficits (Please refer to evaluation for details):  ADL's;Leisure;IADL's    Body Structure / Function / Physical Skills  ADL;UE functional use;FMC;ROM;GMC;Coordination;IADL;Strength;Dexterity    Rehab Potential  Excellent    Clinical Decision Making  Several treatment options, min-mod task modification necessary    Comorbidities Affecting Occupational Performance:  May have comorbidities impacting occupational performance    Modification or Assistance to Complete Evaluation   No modification of tasks or assist necessary to complete eval    OT Frequency  2x / week    OT Duration  4 weeks    OT Treatment/Interventions  Self-care/ADL training;Ultrasound;Neuromuscular education;Therapeutic activities;Manual Therapy;Therapeutic exercise;Moist Heat;Electrical Stimulation;Cryotherapy;Passive range of motion;Patient/family education;DME and/or AE instruction    Plan  P: Pt will benefit from skilled OT services to increase functional use of his RUE and allow him to reach the highest level of independence with all daily tasks while using his right UE as his dominant extremity. Treatment plan: Measure Right shoulder passive ROM next session. passive ROM of right shoulder, grip and pinch strengthening, coordination tasks.    Consulted and Agree with Plan of Care  Patient       Patient will benefit from skilled therapeutic intervention in order to improve the following deficits and impairments:   Body Structure / Function / Physical Skills: ADL, UE functional use, FMC, ROM, GMC, Coordination, IADL, Strength, Dexterity       Visit Diagnosis: Other lack of coordination - Plan: Ot plan of care cert/re-cert  Other symptoms and signs involving the musculoskeletal system - Plan: Ot plan of care cert/re-cert  Stiffness of right shoulder, not elsewhere classified - Plan: Ot plan of care  cert/re-cert    Problem List Patient Active Problem List   Diagnosis Date Noted  . Barrett's esophagus 06/15/2019  . Peptic stricture of esophagus 03/30/2019  . Anemia 03/30/2019  . Abnormality of esophagus   . Gastritis and gastroduodenitis   . Gastroesophageal reflux disease with esophagitis   . Lobar pneumonia ([place]) 08/22/2018  . Sepsis due to undetermined organism ([place]) 08/22/2018  . Fever   . Precordial pain   . HCAP (healthcare-associated pneumonia) 08/21/2018  . Elevated transaminase level 08/21/2018  . Atrial tachycardia ([place]) 07/13/2018  . Dyslipidemia 07/13/2018  . Acute ischemic stroke ([place]) 07/12/2018  . Essential hypertension 07/12/2018  . Tobacco abuse 07/12/2018  . Cocaine abuse ([place]) 07/12/2018  . Ischemic stroke [facility])     Ailene Ravel, OTR/L,CBIS  [phone]   07/06/2019, 3:53 PM  [facility] [address] [place], [place], [address] Phone: [phone]   Fax:  [phone]  Name: Kregg Fallert MRN: [id number] Date of Birth: 1961-03-14

## 2019-07-06 NOTE — Patient Instructions (Signed)
Home Exercises Program Theraputty Exercises  Do the following exercises 1-2 times a day using your affected hand.  1. Roll putty into a ball.  2. Make into a pancake.  3. Roll putty into a roll.  4. Pinch along log with first finger and thumb.   5. Make into a ball.  6. Roll it back into a log.   7. Pinch using thumb and side of first finger.  8. Roll into a ball, then flatten into a pancake.  9. Using your fingers, make putty into a mountain.  10. Roll putty into a ball and squeeze and release 10 times.   SHOULDER: Flexion On Table   Place hands on towel placed on table, elbows straight. Lean forward with you upper body, pushing towel away from body.  _10__ reps per set, _2__ sets per day  Abduction (Passive)   With your arm out to side, resting on towel placed on table with palm DOWN, keeping trunk away from table, lean to the side while pushing towel away from body.  Repeat _10___ times. Do __2__ sessions per day.

## 2019-07-07 ENCOUNTER — Ambulatory Visit (HOSPITAL_COMMUNITY): Payer: Medicaid Other

## 2019-07-07 ENCOUNTER — Encounter (HOSPITAL_COMMUNITY): Payer: Self-pay

## 2019-07-07 DIAGNOSIS — M6281 Muscle weakness (generalized): Secondary | ICD-10-CM | POA: Diagnosis not present

## 2019-07-07 DIAGNOSIS — R262 Difficulty in walking, not elsewhere classified: Secondary | ICD-10-CM

## 2019-07-07 NOTE — Therapy (Signed)
[place], [place], [address] Phone: [phone]   Fax:  [phone]  Physical Therapy Treatment  Patient Details  Name: Evan Chavez MRN: [id number] Date of Birth: [birth year] Referring Provider (PT): Rosita Fire    Encounter Date: 07/07/2019  PT End of Session - 07/07/19 1314    Visit Number  5    Number of Visits  16    Date for PT Re-Evaluation  07/29/19   Progress note complete 9/17 visit #4   Authorization Type  12 visits approved from 9/24-->08/17/19    Authorization Time Period  06/17/19-->07/29/19    Authorization - Visit Number  1    Authorization - Number of Visits  12    PT Start Time  1311    PT Stop Time  1358    PT Time Calculation (min)  47 min    Equipment Utilized During Treatment  Gait belt    Activity Tolerance  Patient tolerated treatment well;Patient limited by lethargy    Behavior During Therapy  [facility] for tasks assessed/performed       Past Medical History:  Diagnosis Date  . Cocaine abuse ([place])   . ETOH abuse   . GERD (gastroesophageal reflux disease)   . Hyperlipidemia   . Stroke (cerebrum) ([place]) 06/2018   right sided weakness    Past Surgical History:  Procedure Laterality Date  . BIOPSY  08/25/2018   Procedure: BIOPSY;  Surgeon: [name], MD;  [facility];  Service: Endoscopy;;  . BIOPSY  12/13/2018   Procedure: BIOPSY;  Surgeon: [name], MD;  [facility];  Service: Endoscopy;;  esophagus  . COLONOSCOPY WITH PROPOFOL N/A 04/07/2019   Dr. [name]: 3 Tubular adenomas removed (4 to 8 mm in size), next colonoscopy 3 years.  . ESOPHAGOGASTRODUODENOSCOPY (EGD) WITH PROPOFOL N/A 08/25/2018   Dr. [name]: Severely inflamed abnormal appearing mid/distal esophagus.  Encroachment on lumen suspicious for infiltrating neoplasm located be all from severe benign inflammation, extensive gastric erosions and extensive duodenal ulcerations.  Query ischemic  process versus other.  Biopsy revealed Barrett's esophagus but negative for malignancy and H. pylori.  . ESOPHAGOGASTRODUODENOSCOPY (EGD) WITH PROPOFOL N/A 12/13/2018   Dr. [name]: peptic stricture s/p balloon dilatation. abnormal esophageal mucosa, by c/w barrett's without dysplasia, previous PUD completely healed.   Marland Kitchen POLYPECTOMY  04/07/2019   Procedure: POLYPECTOMY;  Surgeon: [name], MD;  [facility];  Service: Endoscopy;;  colon    There were no vitals filed for this visit.  Subjective Assessment - 07/07/19 1304    Subjective  Pt stated he is feeling good today.  Most difficulty with Rt shoulder, soreness wiht movements.  No reports of recent fall.    Pertinent History  CVA 2019, HTN, substance abuse    Patient Stated Goals  He would like to be able to move around a lot better.    Currently in Pain?  Yes    Pain Score  4     Pain Location  Shoulder    Pain Orientation  Right    Pain Descriptors / Indicators  Sore    Pain Type  Acute pain    Pain Onset  More than a month ago    Pain Frequency  Intermittent    Aggravating Factors   Rt UE movement    Pain Relieving Factors  unsure    Effect of Pain on Daily Activities  Pt pushed through the  pain.         [facility] PT Assessment - 07/07/19 0001      Assessment   Medical Diagnosis  right side weakness    Referring Provider (PT)  Tesfaye Fanta     Onset Date/Surgical Date  05/15/18    Hand Dominance  Right    Next MD Visit  not scheduled     Prior Therapy  Pt received OT and PT in acute care only.      Precautions   Precautions  [facility] Adult PT Treatment/Exercise - 07/07/19 0001      Exercises   Exercises  Knee/Hip      Knee/Hip Exercises: Standing   Heel Raises  Both;15 reps    Heel Raises Limitations  no hands; toe raises on incline slope     Lateral Step Up  Both;Hand Hold: 1;15 reps;Step Height: 4"    Forward Step Up  Both;15 reps;Hand Hold: 1;Step Height: 6"    Step  Down  Both;10 reps;Hand Hold: 1;Step Height: 4"      Knee/Hip Exercises: Seated   Sit to Sand  10 reps;without UE support      Knee/Hip Exercises: Supine   Bridges  10 reps    Bridges Limitations  5" holds          Balance Exercises - 07/07/19 1330      Balance Exercises: Standing   SLS  Eyes open;Solid surface;5 reps;30 secs   Rt 8", Lt 4" max   Tandem Gait  2 reps    Sidestepping  2 reps    Step Over Hurdles / Cones  6in hurdles step to pattern each leading 4RT total    Marching Limitations  Dead bug 12/24/22 with opposite UE/LE 10x required 1 intermitent HHA    Heel Raises Limitations  15    Toe Raise Limitations  15    Other Standing Exercises             PT Short Term Goals - 06/30/19 1335      PT SHORT TERM GOAL #1   Title  PT to be I in a HEP to increase core and Rt LE strength to allow pt to be able to go up 3-5 steps with one hand assist    Baseline  able to perform on 5 inch steps, unable to perform on standard steps    Time  3    Period  Weeks    Status  Partially Met    Target Date  07/07/19      PT SHORT TERM GOAL #2   Title  Pt to be able to single leg stance on both LE for at least 10 seconds to decrease risk of falling    Baseline  Rt: 0, Lt 6 seconds    Time  3    Period  Weeks    Status  On-going        PT Long Term Goals - 06/30/19 1336      PT LONG TERM GOAL #1   Title  PT LE stength to have increased by one grade to allow pt to ambulate greater than 320 ft in three minutes using appropriate assistive device.    Baseline  9/17 298 ft with RW    Time  6    Period  Weeks    Status  On-going      PT  LONG TERM GOAL #2   Title  PT to be able to single leg stance for at least 15 seconds on both LE to allow pt to feel confident walking in his home with a single point cane.    Time  6    Period  Weeks    Status  On-going      PT LONG TERM GOAL #3   Title  PT core and LE strength to be improved to allow pt to be able to get in and out of  lower chairs/ cars without difficulty.    Time  6    Period  Weeks    Status  On-going      PT LONG TERM GOAL #4   Title  PT to be able to go up and down  8-10(one flight) of steps with a cane and one hand hold.    Time  6    Period  Weeks    Status  On-going            Plan - 07/07/19 1732    Clinical Impression Statement  Continued session focus with balance training and functional strengthening.  Pt with difficulty clearing hurdles with Rt LE, added bridges for hamstring strengthening.  Pt conitnues to have difficulty with SLS activities and UE/LE coordination, added standing UE/LE marching difficult to complete without HHA.  Resume Nustep next session for UE/LE coordination.  Reviewed compliance with HEP, pt reports he had been compliant with OT but not PT exercises at home.  Pt educated on benefits of compliance for maximal benefits, given additional copy.    Personal Factors and Comorbidities  Time since onset of injury/illness/exacerbation;Transportation;Social Background    Examination-Activity Limitations  Bathing;Bed Mobility;Carry;Dressing;Stairs;Squat;Locomotion Level;Lift    Examination-Participation Restrictions  Cleaning;Community Activity;Laundry;Yard Work    Stability/Clinical Decision Making  Stable/Uncomplicated    Clinical Decision Making  Low    Rehab Potential  Good    PT Frequency  2x / week    PT Duration  8 weeks    PT Treatment/Interventions  ADLs/Self Care Home Management;Gait training;Therapeutic exercise;Therapeutic activities;Manual techniques;Functional mobility training;Balance training    PT Next Visit Plan  Resume Nustep UE/LE.  work on coordination/precision of movement and functional strength/endurance, SLS and go up/down steps with one UE assist.    PT Home Exercise Plan  LAQ, Sidelying hip abduction, prone knee flexion, prone hip extension, standing sit to stand and heel raises.       Patient will benefit from skilled therapeutic intervention in  order to improve the following deficits and impairments:  Abnormal gait, Decreased activity tolerance, Decreased balance, Decreased strength, Difficulty walking, Decreased mobility  Visit Diagnosis: Muscle weakness (generalized)  Difficulty in walking, not elsewhere classified     Problem List Patient Active Problem List   Diagnosis Date Noted  . Barrett's esophagus 06/15/2019  . Peptic stricture of esophagus 03/30/2019  . Anemia 03/30/2019  . Abnormality of esophagus   . Gastritis and gastroduodenitis   . Gastroesophageal reflux disease with esophagitis   . Lobar pneumonia ([place]) 08/22/2018  . Sepsis due to undetermined organism ([place]) 08/22/2018  . Fever   . Precordial pain   . HCAP (healthcare-associated pneumonia) 08/21/2018  . Elevated transaminase level 08/21/2018  . Atrial tachycardia ([place]) 07/13/2018  . Dyslipidemia 07/13/2018  . Acute ischemic stroke ([place]) 07/12/2018  . Essential hypertension 07/12/2018  . Tobacco abuse 07/12/2018  . Cocaine abuse ([place]) 07/12/2018  . Ischemic stroke [facility])    Ihor Austin, [place]; CBIS [phone]  Aldona Lento 07/07/2019, 5:40 PM  [place], [place], [address] Phone: [phone]   Fax:  [phone]  Name: Evan Chavez MRN: [id number] Date of Birth: 09-23-1961

## 2019-07-12 ENCOUNTER — Ambulatory Visit (HOSPITAL_COMMUNITY): Payer: Medicaid Other | Admitting: Physical Therapy

## 2019-07-12 ENCOUNTER — Other Ambulatory Visit: Payer: Self-pay

## 2019-07-12 ENCOUNTER — Encounter (HOSPITAL_COMMUNITY): Payer: Self-pay | Admitting: Physical Therapy

## 2019-07-12 DIAGNOSIS — R262 Difficulty in walking, not elsewhere classified: Secondary | ICD-10-CM

## 2019-07-12 DIAGNOSIS — R278 Other lack of coordination: Secondary | ICD-10-CM

## 2019-07-12 DIAGNOSIS — M6281 Muscle weakness (generalized): Secondary | ICD-10-CM

## 2019-07-12 DIAGNOSIS — R29898 Other symptoms and signs involving the musculoskeletal system: Secondary | ICD-10-CM

## 2019-07-12 NOTE — Therapy (Signed)
[place], [place], [address] Phone: [phone]   Fax:  [phone]  Physical Therapy Treatment  Patient Details  Name: Evan Chavez MRN: [id number] Date of Birth: 03-Dec-1960 Referring Provider (PT): Rosita Fire    Encounter Date: 07/12/2019  PT End of Session - 07/12/19 1355    Visit Number  6    Number of Visits  16    Date for PT Re-Evaluation  07/29/19   Progress note complete 9/17 visit #4   Authorization Type  12 visits approved from 9/24-->08/17/19    Authorization Time Period  06/17/19-->07/29/19    Authorization - Visit Number  10    Authorization - Number of Visits  16    PT Start Time  1315    PT Stop Time  1358    PT Time Calculation (min)  43 min    Equipment Utilized During Treatment  Gait belt    Activity Tolerance  Patient tolerated treatment well;Patient limited by lethargy    Behavior During Therapy  [facility] for tasks assessed/performed       Past Medical History:  Diagnosis Date  . Cocaine abuse ([place])   . ETOH abuse   . GERD (gastroesophageal reflux disease)   . Hyperlipidemia   . Stroke (cerebrum) ([place]) 06/2018   right sided weakness    Past Surgical History:  Procedure Laterality Date  . BIOPSY  08/25/2018   Procedure: BIOPSY;  Surgeon: [name], MD;  [facility];  Service: Endoscopy;;  . BIOPSY  12/13/2018   Procedure: BIOPSY;  Surgeon: [name], MD;  [facility];  Service: Endoscopy;;  esophagus  . COLONOSCOPY WITH PROPOFOL N/A 04/07/2019   Dr. [name]: 3 Tubular adenomas removed (4 to 8 mm in size), next colonoscopy 3 years.  . ESOPHAGOGASTRODUODENOSCOPY (EGD) WITH PROPOFOL N/A 08/25/2018   Dr. [name]: Severely inflamed abnormal appearing mid/distal esophagus.  Encroachment on lumen suspicious for infiltrating neoplasm located be all from severe benign inflammation, extensive gastric erosions and extensive duodenal ulcerations.  Query  ischemic process versus other.  Biopsy revealed Barrett's esophagus but negative for malignancy and H. pylori.  . ESOPHAGOGASTRODUODENOSCOPY (EGD) WITH PROPOFOL N/A 12/13/2018   Dr. [name]: peptic stricture s/p balloon dilatation. abnormal esophageal mucosa, by c/w barrett's without dysplasia, previous PUD completely healed.   Marland Kitchen POLYPECTOMY  04/07/2019   Procedure: POLYPECTOMY;  Surgeon: [name], MD;  [facility];  Service: Endoscopy;;  colon    There were no vitals filed for this visit.  Subjective Assessment - 07/12/19 1323    Subjective  Pt states that he is exercising at home    Pertinent History  CVA 2019, HTN, substance abuse    Patient Stated Goals  He would like to be able to move around a lot better.    Currently in Pain?  No/denies    Pain Onset  More than a month ago             [facility] Adult PT Treatment/Exercise - 07/12/19 0001      Ambulation/Gait   Ambulation Distance (Feet)  226 Feet    Assistive device  Straight cane    Stairs  Yes    Stairs Assistance  6: Modified independent (Device/Increase time)    Stair Management Technique  No rails;Alternating pattern    Number of Stairs  8   with cane     Exercises   Exercises  Knee/Hip  Knee/Hip Exercises: Machines for Strengthening   Total Gym Leg Press  3 Pl x 15       Knee/Hip Exercises: Standing   Heel Raises  Both;15 reps    Heel Raises Limitations  combined with squats     Forward Lunges  10 reps      Knee/Hip Exercises: Seated   Sit to Sand  10 reps;without UE support      Knee/Hip Exercises: Prone   Other Prone Exercises  quadriped shift from UE to LE, SAR, SLR x 10 each           Balance Exercises - 07/12/19 1333      Balance Exercises: Standing   Tandem Stance  Eyes open;Foam/compliant surface;2 reps    Tandem Gait  2 reps    Sidestepping  2 reps   red t -band    Step Over Hurdles / Cones  6in hurdles step to pattern each leading 4RT total          PT Short  Term Goals - 07/12/19 1407      PT SHORT TERM GOAL #1   Title  PT to be I in a HEP to increase core and Rt LE strength to allow pt to be able to go up 3-5 steps with one hand assist    Baseline  able to perform on 5 inch steps, unable to perform on standard steps    Time  3    Period  Weeks    Status  Achieved    Target Date  07/07/19      PT SHORT TERM GOAL #2   Title  Pt to be able to single leg stance on both LE for at least 10 seconds to decrease risk of falling    Baseline  Rt: 0, Lt 6 seconds    Time  3    Period  Weeks    Status  On-going        PT Long Term Goals - 07/12/19 1407      PT LONG TERM GOAL #1   Title  PT LE stength to have increased by one grade to allow pt to ambulate greater than 320 ft in three minutes using appropriate assistive device.    Baseline  9/17 298 ft with RW    Time  6    Period  Weeks    Status  On-going      PT LONG TERM GOAL #2   Title  PT to be able to single leg stance for at least 15 seconds on both LE to allow pt to feel confident walking in his home with a single point cane.    Time  6    Period  Weeks    Status  On-going      PT LONG TERM GOAL #3   Title  PT core and LE strength to be improved to allow pt to be able to get in and out of lower chairs/ cars without difficulty.    Time  6    Period  Weeks    Status  On-going      PT LONG TERM GOAL #4   Title  PT to be able to go up and down  8-10(one flight) of steps with a cane and one hand hold.    Time  6    Period  Weeks    Status  Achieved            Plan - 07/12/19 1356  Clinical Impression Statement  Pt continues to work on balance as well as strengthening adding quadriped activity and cybex leg press.  Pt is motivated but requires several breaks.  Gt trained with cane and recommended that pt purchases a cane for home use.    Personal Factors and Comorbidities  Time since onset of injury/illness/exacerbation;Transportation;Social Background     Examination-Activity Limitations  Bathing;Bed Mobility;Carry;Dressing;Stairs;Squat;Locomotion Level;Lift    Examination-Participation Restrictions  Cleaning;Community Activity;Laundry;Yard Work    Stability/Clinical Decision Making  Stable/Uncomplicated    Rehab Potential  Good    PT Frequency  2x / week    PT Duration  8 weeks    PT Treatment/Interventions  ADLs/Self Care Home Management;Gait training;Therapeutic exercise;Therapeutic activities;Manual techniques;Functional mobility training;Balance training    PT Next Visit Plan  Resume Nustep UE/LE.  work on coordination/precision of movement and functional strength/endurance, SLS    PT Home Exercise Plan  LAQ, Sidelying hip abduction, prone knee flexion, prone hip extension, standing sit to stand and heel raises.       Patient will benefit from skilled therapeutic intervention in order to improve the following deficits and impairments:  Abnormal gait, Decreased activity tolerance, Decreased balance, Decreased strength, Difficulty walking, Decreased mobility  Visit Diagnosis: Muscle weakness (generalized)  Difficulty in walking, not elsewhere classified  Other lack of coordination  Other symptoms and signs involving the musculoskeletal system     Problem List Patient Active Problem List   Diagnosis Date Noted  . Barrett's esophagus 06/15/2019  . Peptic stricture of esophagus 03/30/2019  . Anemia 03/30/2019  . Abnormality of esophagus   . Gastritis and gastroduodenitis   . Gastroesophageal reflux disease with esophagitis   . Lobar pneumonia ([place]) 08/22/2018  . Sepsis due to undetermined organism ([place]) 08/22/2018  . Fever   . Precordial pain   . HCAP (healthcare-associated pneumonia) 08/21/2018  . Elevated transaminase level 08/21/2018  . Atrial tachycardia ([place]) 07/13/2018  . Dyslipidemia 07/13/2018  . Acute ischemic stroke ([place]) 07/12/2018  . Essential hypertension 07/12/2018  . Tobacco abuse 07/12/2018  . Cocaine  abuse ([place]) 07/12/2018  . Ischemic stroke [facility])    Rayetta Humphrey, PT CLT [phone] 07/12/2019, 2:08 PM  [place] [address] [place], [place], [address] Phone: [phone]   Fax:  [phone]  Name: Matheus Sporn MRN: [id number] Date of Birth: [birth year]

## 2019-07-13 ENCOUNTER — Ambulatory Visit (HOSPITAL_COMMUNITY): Payer: Medicaid Other | Admitting: Occupational Therapy

## 2019-07-13 ENCOUNTER — Other Ambulatory Visit: Payer: Self-pay

## 2019-07-13 ENCOUNTER — Encounter (HOSPITAL_COMMUNITY): Payer: Self-pay | Admitting: Occupational Therapy

## 2019-07-13 DIAGNOSIS — R29898 Other symptoms and signs involving the musculoskeletal system: Secondary | ICD-10-CM

## 2019-07-13 DIAGNOSIS — M6281 Muscle weakness (generalized): Secondary | ICD-10-CM | POA: Diagnosis not present

## 2019-07-13 DIAGNOSIS — R278 Other lack of coordination: Secondary | ICD-10-CM

## 2019-07-13 DIAGNOSIS — M25611 Stiffness of right shoulder, not elsewhere classified: Secondary | ICD-10-CM

## 2019-07-13 NOTE — Therapy (Signed)
[place], [place], [address] Phone: [phone]   Fax:  [phone]  Occupational Therapy Treatment  Patient Details  Name: Evan Chavez MRN: [id number] Date of Birth: [birth year] Referring Provider (OT): Dr. Rosita Fire   Encounter Date: 07/13/2019  OT End of Session - 07/13/19 1110    Visit Number  2    Number of Visits  8    Date for OT Re-Evaluation  08/03/19    Authorization Type  3 visits approved 9/30-10/13/20    Authorization - Visit Number  1    Authorization - Number of Visits  3    OT Start Time  [id number]    OT Stop Time  1100    OT Time Calculation (min)  42 min    Activity Tolerance  Patient tolerated treatment well    Behavior During Therapy  [facility] for tasks assessed/performed       Past Medical History:  Diagnosis Date  . Cocaine abuse ([place])   . ETOH abuse   . GERD (gastroesophageal reflux disease)   . Hyperlipidemia   . Stroke (cerebrum) ([place]) 06/2018   right sided weakness    Past Surgical History:  Procedure Laterality Date  . BIOPSY  08/25/2018   Procedure: BIOPSY;  Surgeon: [name], MD;  [facility];  Service: Endoscopy;;  . BIOPSY  12/13/2018   Procedure: BIOPSY;  Surgeon: [name], MD;  [facility];  Service: Endoscopy;;  esophagus  . COLONOSCOPY WITH PROPOFOL N/A 04/07/2019   Dr. [name]: 3 Tubular adenomas removed (4 to 8 mm in size), next colonoscopy 3 years.  . ESOPHAGOGASTRODUODENOSCOPY (EGD) WITH PROPOFOL N/A 08/25/2018   Dr. [name]: Severely inflamed abnormal appearing mid/distal esophagus.  Encroachment on lumen suspicious for infiltrating neoplasm located be all from severe benign inflammation, extensive gastric erosions and extensive duodenal ulcerations.  Query ischemic process versus other.  Biopsy revealed Barrett's esophagus but negative for malignancy and H. pylori.  . ESOPHAGOGASTRODUODENOSCOPY (EGD) WITH PROPOFOL N/A 12/13/2018   Dr. [name]: peptic stricture s/p balloon dilatation. abnormal esophageal mucosa, by c/w barrett's without dysplasia, previous PUD completely healed.   Marland Kitchen POLYPECTOMY  04/07/2019   Procedure: POLYPECTOMY;  Surgeon: [name], MD;  [facility];  Service: Endoscopy;;  colon    There were no vitals filed for this visit.  Subjective Assessment - 07/13/19 1109    Subjective   S: My hand is tired.    Currently in Pain?  No/denies         [facility] OT Assessment - 07/13/19 1022      Assessment   Medical Diagnosis  right side weakness      Precautions   Precautions  Fall      PROM   Overall PROM Comments  Assessed supine, er/IR adducted    PROM Assessment Site  Shoulder    Right/Left Shoulder  Right    Right Shoulder Flexion  138 Degrees    Right Shoulder ABduction  105 Degrees    Right Shoulder Internal Rotation  90 Degrees    Right Shoulder External Rotation  46 Degrees               OT Treatments/Exercises (OP) - 07/13/19 1028      Exercises   Exercises  Shoulder;Hand      Shoulder Exercises: Supine   Protraction  PROM;5 reps;AAROM;10 reps    Horizontal ABduction  PROM;5 reps;AROM;10 reps  External Rotation  PROM;5 reps;AAROM;10 reps    Internal Rotation  PROM;5 reps;AAROM;10 reps    Flexion  PROM;5 reps;AAROM;10 reps    ABduction  PROM;5 reps;AROM;10 reps      Shoulder Exercises: ROM/Strengthening   Wall Wash  1'      Hand Exercises   Other Hand Exercises  Pt working on pinch strength during pinch tree task. Min difficulty with yellow and red clothespins, mod with green, and mod to max with blue. Pt requiring one rest break when placing blue clothespins.       Functional Reaching Activities   Mid Level  Pt completing pinch tree activity working on functional reach and incorporating pinch strengthening. Pt placed all yellow, red, and green clothespins on vertical bar of pinch tree, placed all blue clothespins along top horizontal bar. Pt reports  more challenge with reaching than with pinching.       Fine Motor Coordination (Hand/Wrist)   Fine Motor Coordination  Manipulating coins    Manipulating coins  Pt holding 6 coins in palm working on palm to fingertip translation/in-hand manipulation. Pt with max difficulty manipulating pennies, therefore transitioned to Sequence game chips with greater success. Pt able to manipulate 2 chips and place in bucket before complaining of hand fatigue.              OT Education - 07/13/19 1142    Education Details  educated on using quarters or small objects to practice in-hand manipulation at home    Person(s) Educated  Patient    Methods  Explanation;Demonstration;Verbal cues;Handout    Comprehension  Verbalized understanding       OT Short Term Goals - 07/13/19 1144      OT SHORT TERM GOAL #1   Title  Patient will be educated and independent with HEP with reports of completing 1-2 times a day in order to faciliate progress in therapy and allow patient increased use of his RUE during daily tasks.    Time  2    Period  Weeks    Status  On-going    Target Date  07/20/19        OT Long Term Goals - 07/13/19 1144      OT LONG TERM GOAL #1   Title  Patient will increase his right shoulder Passive ROM to [facility] in order to complete reaching tasks at shoulder level with less difficulty.    Time  4    Period  Weeks    Status  On-going      OT LONG TERM GOAL #2   Title  Patient will increase his right grip strength by 15# and his pinch strength by 6# in order to be able to hold onto items without dropping.    Time  4    Period  Weeks    Status  On-going      OT LONG TERM GOAL #3   Title  Patient will increase his right shoulder strength to 4/5 overall in order to be able to lift and handle normal household items with less difficulty.    Time  4    Period  Weeks    Status  On-going      OT LONG TERM GOAL #4   Title  Patient will increase his right hand coordination by completing  the 9 hole peg test in 40 seconds or less in order to return to using his right hand as his dominant extremity.    Time  4  Period  Weeks    Status  On-going      OT LONG TERM GOAL #5   Title  Pt will increase A/ROM to [facility] to improve ability to perform functional reaching tasks above shoulder level during ADLs.    Time  4    Period  Weeks    Status  New            Plan - 07/13/19 1110    Clinical Impression Statement  A: Passive ROM measurements taken today, additional goal created to target. Initiated AA/ROM and A/ROM in supine, completed wall wash and functional reaching to target RUE shoulder mobility required for ADL completion. Also initiated fine motor coordination activity, pt with max difficulty using coins improving when switched to chips. Verbal cuing for form and technique during session.    Body Structure / Function / Physical Skills  ADL;UE functional use;FMC;ROM;GMC;Coordination;IADL;Strength;Dexterity    Plan  P: Attempt all A/ROM exercises in supine, complete functional reaching in standing with cones, add gripper task       Patient will benefit from skilled therapeutic intervention in order to improve the following deficits and impairments:   Body Structure / Function / Physical Skills: ADL, UE functional use, FMC, ROM, GMC, Coordination, IADL, Strength, Dexterity       Visit Diagnosis: Other lack of coordination  Other symptoms and signs involving the musculoskeletal system  Stiffness of right shoulder, not elsewhere classified    Problem List Patient Active Problem List   Diagnosis Date Noted  . Barrett's esophagus 06/15/2019  . Peptic stricture of esophagus 03/30/2019  . Anemia 03/30/2019  . Abnormality of esophagus   . Gastritis and gastroduodenitis   . Gastroesophageal reflux disease with esophagitis   . Lobar pneumonia ([place]) 08/22/2018  . Sepsis due to undetermined organism ([place]) 08/22/2018  . Fever   . Precordial pain   . HCAP  (healthcare-associated pneumonia) 08/21/2018  . Elevated transaminase level 08/21/2018  . Atrial tachycardia ([place]) 07/13/2018  . Dyslipidemia 07/13/2018  . Acute ischemic stroke ([place]) 07/12/2018  . Essential hypertension 07/12/2018  . Tobacco abuse 07/12/2018  . Cocaine abuse ([place]) 07/12/2018  . Ischemic stroke [facility])    [name], OTR/L  [phone] 07/13/2019, 12:00 PM  [place] [address] [place], [place], [address] Phone: [phone]   Fax:  [phone]  Name: Winfred [place]burg MRN: [id number] Date of Birth: [birth year]

## 2019-07-14 ENCOUNTER — Encounter (HOSPITAL_COMMUNITY): Payer: Self-pay | Admitting: Physical Therapy

## 2019-07-14 ENCOUNTER — Ambulatory Visit (HOSPITAL_COMMUNITY): Payer: Medicaid Other | Attending: Internal Medicine | Admitting: Physical Therapy

## 2019-07-14 DIAGNOSIS — R278 Other lack of coordination: Secondary | ICD-10-CM | POA: Diagnosis present

## 2019-07-14 DIAGNOSIS — R262 Difficulty in walking, not elsewhere classified: Secondary | ICD-10-CM | POA: Diagnosis present

## 2019-07-14 DIAGNOSIS — R29898 Other symptoms and signs involving the musculoskeletal system: Secondary | ICD-10-CM | POA: Insufficient documentation

## 2019-07-14 DIAGNOSIS — M25611 Stiffness of right shoulder, not elsewhere classified: Secondary | ICD-10-CM | POA: Diagnosis present

## 2019-07-14 DIAGNOSIS — M6281 Muscle weakness (generalized): Secondary | ICD-10-CM | POA: Insufficient documentation

## 2019-07-14 NOTE — Therapy (Signed)
[place], [place], [address] Phone: [phone]   Fax:  [phone]  Physical Therapy Treatment  Patient Details  Name: Evan Chavez MRN: [id number] Date of Birth: 03-Jul-1961 Referring Provider (PT): Rosita Fire    Encounter Date: 07/14/2019  PT End of Session - 07/14/19 1357    Visit Number  7    Number of Visits  16    Date for PT Re-Evaluation  07/29/19   Progress note complete 9/17 visit #4   Authorization Type  12 visits approved from 9/24-->08/17/19    Authorization Time Period  06/17/19-->07/29/19    Authorization - Visit Number  7    Authorization - Number of Visits  16    PT Start Time  1315    PT Stop Time  1355    PT Time Calculation (min)  40 min    Equipment Utilized During Treatment  Gait belt    Activity Tolerance  Patient tolerated treatment well;Patient limited by lethargy    Behavior During Therapy  [facility] for tasks assessed/performed       Past Medical History:  Diagnosis Date  . Cocaine abuse ([place])   . ETOH abuse   . GERD (gastroesophageal reflux disease)   . Hyperlipidemia   . Stroke (cerebrum) ([place]) 06/2018   right sided weakness    Past Surgical History:  Procedure Laterality Date  . BIOPSY  08/25/2018   Procedure: BIOPSY;  Surgeon: [name], MD;  [facility];  Service: Endoscopy;;  . BIOPSY  12/13/2018   Procedure: BIOPSY;  Surgeon: [name], MD;  [facility];  Service: Endoscopy;;  esophagus  . COLONOSCOPY WITH PROPOFOL N/A 04/07/2019   Dr. [name]: 3 Tubular adenomas removed (4 to 8 mm in size), next colonoscopy 3 years.  . ESOPHAGOGASTRODUODENOSCOPY (EGD) WITH PROPOFOL N/A 08/25/2018   Dr. [name]: Severely inflamed abnormal appearing mid/distal esophagus.  Encroachment on lumen suspicious for infiltrating neoplasm located be all from severe benign inflammation, extensive gastric erosions and extensive duodenal ulcerations.  Query ischemic  process versus other.  Biopsy revealed Barrett's esophagus but negative for malignancy and H. pylori.  . ESOPHAGOGASTRODUODENOSCOPY (EGD) WITH PROPOFOL N/A 12/13/2018   Dr. [name]: peptic stricture s/p balloon dilatation. abnormal esophageal mucosa, by c/w barrett's without dysplasia, previous PUD completely healed.   Marland Kitchen POLYPECTOMY  04/07/2019   Procedure: POLYPECTOMY;  Surgeon: [name], MD;  [facility];  Service: Endoscopy;;  colon    There were no vitals filed for this visit.  Subjective Assessment - 07/14/19 1314    Subjective  Pt is doing his exercises a couple of times a week.    Pertinent History  CVA 2019, HTN, substance abuse    Patient Stated Goals  He would like to be able to move around a lot better.    Currently in Pain?  No/denies    Pain Onset  More than a month ago           [facility] Adult PT Treatment/Exercise - 07/14/19 0001      Ambulation/Gait   Ambulation Distance (Feet)  226 Feet    Assistive device  None    Stairs  Yes    Stairs Assistance  6: Modified independent (Device/Increase time)    Stair Management Technique  One rail Right    Number of Stairs  12      Exercises   Exercises  Knee/Hip  Knee/Hip Exercises: Aerobic   Nustep  hills 3, level 4 x 10"      Knee/Hip Exercises: Machines for Strengthening   Total Gym Leg Press  3 Pl x 15       Knee/Hip Exercises: Standing   Heel Raises  Both;15 reps    Heel Raises Limitations  combined with squats     Forward Lunges  10 reps                                                         Balance Exercises - 07/14/19 1355      Balance Exercises: Standing   Tandem Gait  Forward;3 reps    Sidestepping  3 reps;Theraband   greent -band    Other Standing Exercises  ladder, in out x 2 RT; up back x 2 RT           PT Short Term Goals - 07/12/19 1407      PT SHORT TERM GOAL #1   Title  PT to be I in a HEP to increase core and Rt LE strength to allow pt to be able to  go up 3-5 steps with one hand assist    Baseline  able to perform on 5 inch steps, unable to perform on standard steps    Time  3    Period  Weeks    Status  Achieved    Target Date  07/07/19      PT SHORT TERM GOAL #2   Title  Pt to be able to single leg stance on both LE for at least 10 seconds to decrease risk of falling    Baseline  Rt: 0, Lt 6 seconds    Time  3    Period  Weeks    Status  On-going        PT Long Term Goals - 07/12/19 1407      PT LONG TERM GOAL #1   Title  PT LE stength to have increased by one grade to allow pt to ambulate greater than 320 ft in three minutes using appropriate assistive device.    Baseline  9/17 298 ft with RW    Time  6    Period  Weeks    Status  On-going      PT LONG TERM GOAL #2   Title  PT to be able to single leg stance for at least 15 seconds on both LE to allow pt to feel confident walking in his home with a single point cane.    Time  6    Period  Weeks    Status  On-going      PT LONG TERM GOAL #3   Title  PT core and LE strength to be improved to allow pt to be able to get in and out of lower chairs/ cars without difficulty.    Time  6    Period  Weeks    Status  On-going      PT LONG TERM GOAL #4   Title  PT to be able to go up and down  8-10(one flight) of steps with a cane and one hand hold.    Time  6    Period  Weeks    Status  Achieved  Plan - 07/14/19 1358    Clinical Impression Statement  Added ladder activity to improve coordination, resumed nustep and began ambulation without assistive device this session.    Personal Factors and Comorbidities  Time since onset of injury/illness/exacerbation;Transportation;Social Background    Examination-Activity Limitations  Bathing;Bed Mobility;Carry;Dressing;Stairs;Squat;Locomotion Level;Lift    Examination-Participation Restrictions  Cleaning;Community Activity;Laundry;Yard Work    Stability/Clinical Decision Making  Stable/Uncomplicated    Rehab  Potential  Good    PT Frequency  2x / week    PT Duration  8 weeks    PT Treatment/Interventions  ADLs/Self Care Home Management;Gait training;Therapeutic exercise;Therapeutic activities;Manual techniques;Functional mobility training;Balance training    PT Next Visit Plan  work on coordination/precision of movement and functional strength/endurance, SLS    PT Home Exercise Plan  LAQ, Sidelying hip abduction, prone knee flexion, prone hip extension, standing sit to stand and heel raises.       Patient will benefit from skilled therapeutic intervention in order to improve the following deficits and impairments:  Abnormal gait, Decreased activity tolerance, Decreased balance, Decreased strength, Difficulty walking, Decreased mobility  Visit Diagnosis: Other lack of coordination  Other symptoms and signs involving the musculoskeletal system  Muscle weakness (generalized)  Difficulty in walking, not elsewhere classified     Problem List Patient Active Problem List   Diagnosis Date Noted  . Barrett's esophagus 06/15/2019  . Peptic stricture of esophagus 03/30/2019  . Anemia 03/30/2019  . Abnormality of esophagus   . Gastritis and gastroduodenitis   . Gastroesophageal reflux disease with esophagitis   . Lobar pneumonia ([place]) 08/22/2018  . Sepsis due to undetermined organism ([place]) 08/22/2018  . Fever   . Precordial pain   . HCAP (healthcare-associated pneumonia) 08/21/2018  . Elevated transaminase level 08/21/2018  . Atrial tachycardia ([place]) 07/13/2018  . Dyslipidemia 07/13/2018  . Acute ischemic stroke ([place]) 07/12/2018  . Essential hypertension 07/12/2018  . Tobacco abuse 07/12/2018  . Cocaine abuse ([place]) 07/12/2018  . Ischemic stroke [facility])    Rayetta Humphrey, PT CLT [phone] 07/14/2019, 2:00 PM  [place] [address] [place], [place], [address] Phone: [phone]   Fax:  [phone]  Name: Evan Chavez MRN:  [id number] Date of Birth: May 09, 1961

## 2019-07-15 ENCOUNTER — Other Ambulatory Visit: Payer: Self-pay

## 2019-07-15 ENCOUNTER — Encounter (HOSPITAL_COMMUNITY): Payer: Self-pay | Admitting: Specialist

## 2019-07-15 ENCOUNTER — Ambulatory Visit (HOSPITAL_COMMUNITY): Payer: Medicaid Other | Admitting: Specialist

## 2019-07-15 DIAGNOSIS — M6281 Muscle weakness (generalized): Secondary | ICD-10-CM

## 2019-07-15 DIAGNOSIS — R278 Other lack of coordination: Secondary | ICD-10-CM

## 2019-07-15 DIAGNOSIS — R29898 Other symptoms and signs involving the musculoskeletal system: Secondary | ICD-10-CM

## 2019-07-15 NOTE — Therapy (Signed)
[place], [place], [address] Phone: [phone]   Fax:  [phone]  Occupational Therapy Treatment  Patient Details  Name: Evan Chavez MRN: [id number] Date of Birth: 1961-09-18 Referring Provider (OT): Dr. Rosita Fire   Encounter Date: 07/15/2019  OT End of Session - 07/15/19 1301    Visit Number  3    Number of Visits  8    Date for OT Re-Evaluation  08/03/19    Authorization Type  3 visits approved 9/30-10/13/20    Authorization - Visit Number  2    Authorization - Number of Visits  3    OT Start Time  1030    OT Stop Time  1115    OT Time Calculation (min)  45 min    Activity Tolerance  Patient tolerated treatment well    Behavior During Therapy  [facility] for tasks assessed/performed       Past Medical History:  Diagnosis Date  . Cocaine abuse ([place])   . ETOH abuse   . GERD (gastroesophageal reflux disease)   . Hyperlipidemia   . Stroke (cerebrum) ([place]) 06/2018   right sided weakness    Past Surgical History:  Procedure Laterality Date  . BIOPSY  08/25/2018   Procedure: BIOPSY;  Surgeon: [name], MD;  [facility];  Service: Endoscopy;;  . BIOPSY  12/13/2018   Procedure: BIOPSY;  Surgeon: [name], MD;  [facility];  Service: Endoscopy;;  esophagus  . COLONOSCOPY WITH PROPOFOL N/A 04/07/2019   Dr. [name]: 3 Tubular adenomas removed (4 to 8 mm in size), next colonoscopy 3 years.  . ESOPHAGOGASTRODUODENOSCOPY (EGD) WITH PROPOFOL N/A 08/25/2018   Dr. [name]: Severely inflamed abnormal appearing mid/distal esophagus.  Encroachment on lumen suspicious for infiltrating neoplasm located be all from severe benign inflammation, extensive gastric erosions and extensive duodenal ulcerations.  Query ischemic process versus other.  Biopsy revealed Barrett's esophagus but negative for malignancy and H. pylori.  . ESOPHAGOGASTRODUODENOSCOPY (EGD) WITH PROPOFOL N/A 12/13/2018   Dr. [name]: peptic stricture s/p balloon dilatation. abnormal esophageal mucosa, by c/w barrett's without dysplasia, previous PUD completely healed.   Marland Kitchen POLYPECTOMY  04/07/2019   Procedure: POLYPECTOMY;  Surgeon: [name], MD;  [facility];  Service: Endoscopy;;  colon    There were no vitals filed for this visit.  Subjective Assessment - 07/15/19 1300    Subjective   S:  I am doing ok.  My shoulder hurts when I move it.    Currently in Pain?  Yes    Pain Score  2     Pain Location  Shoulder    Pain Orientation  Right    Pain Descriptors / Indicators  Aching    Pain Type  Acute pain    Pain Radiating Towards  movement         [facility] OT Assessment - 07/15/19 0001      Assessment   Medical Diagnosis  right side weakness      Precautions   Precautions  Fall               OT Treatments/Exercises (OP) - 07/15/19 0001      ADLs   ADL Comments  patient carrying back of walker 1" off ground due to scraping on ground.  OT placed tennis balls on back legs of walker to allow for smoother progression and increased safety, After placement, patient able to walk with walker  on ground with increased safety.       Exercises   Exercises  Theraputty      Shoulder Exercises: Supine   Protraction  PROM;AROM;10 reps    Horizontal ABduction  PROM;AROM;10 reps    External Rotation  PROM;AROM;10 reps    Internal Rotation  PROM;AROM;10 reps    Flexion  PROM;AROM;10 reps    ABduction  PROM;AROM;10 reps      Shoulder Exercises: ROM/Strengthening   Proximal Shoulder Strengthening, Supine  10 times each with min facilitation for technique, speed, accuracy. visual cues for improved span of movement and increased shoulder versus elbow movement.        Theraputty   Theraputty - Flatten  flattened red therapy putty and then used pvc pipe to pull x 5 for improved wrist strength    Theraputty - Roll  red    Theraputty - Grip  red supinated and pronated grasp      Functional  Reaching Activities   High Level  standing at counter, patient able to retrieve 20 cones from 1st shelf in overhead cabinet and place on counter without difficulty.  paitent then picked up cones from counter and placed on second level shelf with min difficulty and min fatigue.  patient using left hand to position cone in cylindrical grasp, with verbal cuing from therapist, patient able to begin cylindrical grasp with right hand, with no need of left hand to position cone.        Manual Therapy   Manual Therapy  Myofascial release    Manual therapy comments  manual therapy completed seperately from all  other interventions this date.  Myofascial release and manual stretching to right upper arm and shoulder region to decrease pain and fascial restrictions and decrease tightness.               OT Education - 07/15/19 1300    Education Details  recommended completing functional reach tasks at home, such as emptying dish drainer and putting away dishes.    Person(s) Educated  Patient    Methods  Explanation;Demonstration;Verbal cues    Comprehension  Verbalized understanding       OT Short Term Goals - 07/13/19 1144      OT SHORT TERM GOAL #1   Title  Patient will be educated and independent with HEP with reports of completing 1-2 times a day in order to faciliate progress in therapy and allow patient increased use of his RUE during daily tasks.    Time  2    Period  Weeks    Status  On-going    Target Date  07/20/19        OT Long Term Goals - 07/13/19 1144      OT LONG TERM GOAL #1   Title  Patient will increase his right shoulder Passive ROM to [facility] in order to complete reaching tasks at shoulder level with less difficulty.    Time  4    Period  Weeks    Status  On-going      OT LONG TERM GOAL #2   Title  Patient will increase his right grip strength by 15# and his pinch strength by 6# in order to be able to hold onto items without dropping.    Time  4    Period  Weeks     Status  On-going      OT LONG TERM GOAL #3   Title  Patient will increase his right shoulder strength to  4/5 overall in order to be able to lift and handle normal household items with less difficulty.    Time  4    Period  Weeks    Status  On-going      OT LONG TERM GOAL #4   Title  Patient will increase his right hand coordination by completing the 9 hole peg test in 40 seconds or less in order to return to using his right hand as his dominant extremity.    Time  4    Period  Weeks    Status  On-going      OT LONG TERM GOAL #5   Title  Pt will increase A/ROM to [facility] to improve ability to perform functional reaching tasks above shoulder level during ADLs.    Time  4    Period  Weeks    Status  New            Plan - 07/15/19 1304    Clinical Impression Statement  A:  Patient presenting with pain and stiffness in his right anterior shoulder region, therefore initiated manual therapy to decrease stiffness and pain and improve pain free mobiilty.  Patient able to complete functional reaching to 1st level shelf in cabinet without difficulty and 2nd level shelf wtih minimal difficulty.    Body Structure / Function / Physical Skills  ADL;UE functional use;FMC;ROM;GMC;Coordination;IADL;Strength;Dexterity    Plan  P:  Continue manual therapy to right shoulder for improved functional reach.  work on reaching to second level shelf with less pain and difficulty.  Add bead location to theraputty exercises.  Complete Medicaid reassessment       Patient will benefit from skilled therapeutic intervention in order to improve the following deficits and impairments:   Body Structure / Function / Physical Skills: ADL, UE functional use, FMC, ROM, GMC, Coordination, IADL, Strength, Dexterity       Visit Diagnosis: Other lack of coordination  Other symptoms and signs involving the musculoskeletal system  Muscle weakness (generalized)    Problem List Patient Active Problem List   Diagnosis  Date Noted  . Barrett's esophagus 06/15/2019  . Peptic stricture of esophagus 03/30/2019  . Anemia 03/30/2019  . Abnormality of esophagus   . Gastritis and gastroduodenitis   . Gastroesophageal reflux disease with esophagitis   . Lobar pneumonia ([place]) 08/22/2018  . Sepsis due to undetermined organism ([place]) 08/22/2018  . Fever   . Precordial pain   . HCAP (healthcare-associated pneumonia) 08/21/2018  . Elevated transaminase level 08/21/2018  . Atrial tachycardia ([place]) 07/13/2018  . Dyslipidemia 07/13/2018  . Acute ischemic stroke ([place]) 07/12/2018  . Essential hypertension 07/12/2018  . Tobacco abuse 07/12/2018  . Cocaine abuse ([place]) 07/12/2018  . Ischemic stroke [facility])     Vangie Bicker, [place], OTR/L [phone]  07/15/2019, 2:56 PM  [place] [address] [place], [place], [address] Phone: [phone]   Fax:  [phone]  Name: Evan Chavez MRN: [id number] Date of Birth: 08-13-1961

## 2019-07-18 ENCOUNTER — Ambulatory Visit (HOSPITAL_COMMUNITY): Payer: Medicaid Other

## 2019-07-18 ENCOUNTER — Telehealth (HOSPITAL_COMMUNITY): Payer: Self-pay

## 2019-07-18 NOTE — Telephone Encounter (Signed)
Patient was a no show. Called home number and spoke with Mom. She only has a PT schedule printed out and was unaware of this appointment. Was able to reschedule this missed appointment for 10/8 at 2:30PM after his PT appointment. Will re-print schedule for Mom.  Ailene Ravel, OTR/L,CBIS  [phone]

## 2019-07-19 ENCOUNTER — Ambulatory Visit (HOSPITAL_COMMUNITY): Payer: Medicaid Other | Admitting: Physical Therapy

## 2019-07-19 ENCOUNTER — Other Ambulatory Visit: Payer: Self-pay

## 2019-07-19 ENCOUNTER — Encounter (HOSPITAL_COMMUNITY): Payer: Self-pay | Admitting: Physical Therapy

## 2019-07-19 DIAGNOSIS — R29898 Other symptoms and signs involving the musculoskeletal system: Secondary | ICD-10-CM

## 2019-07-19 DIAGNOSIS — M6281 Muscle weakness (generalized): Secondary | ICD-10-CM

## 2019-07-19 DIAGNOSIS — R278 Other lack of coordination: Secondary | ICD-10-CM

## 2019-07-19 NOTE — Therapy (Signed)
[place], [place], [address] Phone: [phone]   Fax:  [phone]  Physical Therapy Treatment  Patient Details  Name: Evan Chavez MRN: [id number] Date of Birth: 1961/06/01 Referring Provider (PT): Rosita Fire    Encounter Date: 07/19/2019  PT End of Session - 07/19/19 1324    Visit Number  9    Number of Visits  16    Date for PT Re-Evaluation  07/29/19   Progress note complete 9/17 visit #4   Authorization Type  12 visits approved from 9/24-->08/17/19; PN performed on 4th visit --> PN on 14th    Authorization Time Period  06/17/19-->07/29/19    Authorization - Visit Number  9    Authorization - Number of Visits  16    PT Start Time  1315    PT Stop Time  1355    PT Time Calculation (min)  40 min    Equipment Utilized During Treatment  Gait belt    Activity Tolerance  Patient tolerated treatment well;Patient limited by lethargy    Behavior During Therapy  [facility] for tasks assessed/performed       Past Medical History:  Diagnosis Date  . Cocaine abuse ([place])   . ETOH abuse   . GERD (gastroesophageal reflux disease)   . Hyperlipidemia   . Stroke (cerebrum) ([place]) 06/2018   right sided weakness    Past Surgical History:  Procedure Laterality Date  . BIOPSY  08/25/2018   Procedure: BIOPSY;  Surgeon: [name], MD;  [facility];  Service: Endoscopy;;  . BIOPSY  12/13/2018   Procedure: BIOPSY;  Surgeon: [name], MD;  [facility];  Service: Endoscopy;;  esophagus  . COLONOSCOPY WITH PROPOFOL N/A 04/07/2019   Dr. [name]: 3 Tubular adenomas removed (4 to 8 mm in size), next colonoscopy 3 years.  . ESOPHAGOGASTRODUODENOSCOPY (EGD) WITH PROPOFOL N/A 08/25/2018   Dr. [name]: Severely inflamed abnormal appearing mid/distal esophagus.  Encroachment on lumen suspicious for infiltrating neoplasm located be all from severe benign inflammation, extensive gastric erosions and  extensive duodenal ulcerations.  Query ischemic process versus other.  Biopsy revealed Barrett's esophagus but negative for malignancy and H. pylori.  . ESOPHAGOGASTRODUODENOSCOPY (EGD) WITH PROPOFOL N/A 12/13/2018   Dr. [name]: peptic stricture s/p balloon dilatation. abnormal esophageal mucosa, by c/w barrett's without dysplasia, previous PUD completely healed.   Marland Kitchen POLYPECTOMY  04/07/2019   Procedure: POLYPECTOMY;  Surgeon: [name], MD;  [facility];  Service: Endoscopy;;  colon    There were no vitals filed for this visit.  Subjective Assessment - 07/19/19 1317    Subjective  States he is a little tired but reports no pain.    Pain Score  0-No pain                       [facility] Adult PT Treatment/Exercise - 07/19/19 0001      Ambulation/Gait   Ambulation/Gait  Yes    Ambulation/Gait Assistance  5: Supervision    Ambulation Distance (Feet)  226 Feet    Assistive device  None    Gait Pattern  Decreased arm swing - right;Decreased step length - left;Decreased stride length;Decreased weight shift to right;Right foot flat    Gait Comments  one rest break - walked 180 feet in 2 minutes then rested       Knee/Hip Exercises: Standing   Other Standing Knee Exercises  LE  balance endurance foot taps on 4" step with L UE support (first step then 2nd step - 3x8 bilat. SLS with 2 finger support - 30" holds - occ foot down for balance x4 bilat    Other Standing Knee Exercises  backwards walking with 1 hand assist - 25 feet x6 laps ; lateral stepping (2 steps) with lateral pull rred theraband 3x10 bilat SBA               PT Short Term Goals - 07/12/19 1407      PT SHORT TERM GOAL #1   Title  PT to be I in a HEP to increase core and Rt LE strength to allow pt to be able to go up 3-5 steps with one hand assist    Baseline  able to perform on 5 inch steps, unable to perform on standard steps    Time  3    Period  Weeks    Status  Achieved    Target Date   07/07/19      PT SHORT TERM GOAL #2   Title  Pt to be able to single leg stance on both LE for at least 10 seconds to decrease risk of falling    Baseline  Rt: 0, Lt 6 seconds    Time  3    Period  Weeks    Status  On-going        PT Long Term Goals - 07/12/19 1407      PT LONG TERM GOAL #1   Title  PT LE stength to have increased by one grade to allow pt to ambulate greater than 320 ft in three minutes using appropriate assistive device.    Baseline  9/17 298 ft with RW    Time  6    Period  Weeks    Status  On-going      PT LONG TERM GOAL #2   Title  PT to be able to single leg stance for at least 15 seconds on both LE to allow pt to feel confident walking in his home with a single point cane.    Time  6    Period  Weeks    Status  On-going      PT LONG TERM GOAL #3   Title  PT core and LE strength to be improved to allow pt to be able to get in and out of lower chairs/ cars without difficulty.    Time  6    Period  Weeks    Status  On-going      PT LONG TERM GOAL #4   Title  PT to be able to go up and down  8-10(one flight) of steps with a cane and one hand hold.    Time  6    Period  Weeks    Status  Achieved            Plan - 07/19/19 1311    Clinical Impression Statement  Focused on improving SLS today with taps on steps (longer duration on one leg). Also added backwards stepping with verbal cues to increase left step backwards. Fatigue noted with all activities so increased rest breaks were taken to accommodate for fatigue. Patient improving in overall tolerance of exercises and balance. Patient would continue to benefit from skilled physical therapy focusing on LE endurance and balance.    Personal Factors and Comorbidities  Time since onset of injury/illness/exacerbation;Transportation;Social Background    Examination-Activity Limitations  Bathing;Bed  Mobility;Carry;Dressing;Stairs;Squat;Locomotion Level;Lift    Examination-Participation Restrictions   Cleaning;Community Activity;Laundry;Yard Work    Stability/Clinical Decision Making  Stable/Uncomplicated    Rehab Potential  Good    PT Frequency  2x / week    PT Duration  8 weeks    PT Treatment/Interventions  ADLs/Self Care Home Management;Gait training;Therapeutic exercise;Therapeutic activities;Manual techniques;Functional mobility training;Balance training    PT Next Visit Plan  work on coordination/precision of movement and functional strength/endurance, SLS    PT Home Exercise Plan  LAQ, Sidelying hip abduction, prone knee flexion, prone hip extension, standing sit to stand and heel raises.       Patient will benefit from skilled therapeutic intervention in order to improve the following deficits and impairments:  Abnormal gait, Decreased activity tolerance, Decreased balance, Decreased strength, Difficulty walking, Decreased mobility  Visit Diagnosis: Other lack of coordination  Other symptoms and signs involving the musculoskeletal system  Muscle weakness (generalized)     Problem List Patient Active Problem List   Diagnosis Date Noted  . Barrett's esophagus 06/15/2019  . Peptic stricture of esophagus 03/30/2019  . Anemia 03/30/2019  . Abnormality of esophagus   . Gastritis and gastroduodenitis   . Gastroesophageal reflux disease with esophagitis   . Lobar pneumonia ([place]) 08/22/2018  . Sepsis due to undetermined organism ([place]) 08/22/2018  . Fever   . Precordial pain   . HCAP (healthcare-associated pneumonia) 08/21/2018  . Elevated transaminase level 08/21/2018  . Atrial tachycardia ([place]) 07/13/2018  . Dyslipidemia 07/13/2018  . Acute ischemic stroke ([place]) 07/12/2018  . Essential hypertension 07/12/2018  . Tobacco abuse 07/12/2018  . Cocaine abuse ([place]) 07/12/2018  . Ischemic stroke ([place])     2:01 PM, 07/19/19 Jerene Pitch, DPT Physical Therapy with [facility]  [phone] office   [place] [address] [place], [place], [address] Phone: [phone]   Fax:  [phone]  Name: Evan Chavez MRN: [id number] Date of Birth: 09/03/1961

## 2019-07-21 ENCOUNTER — Ambulatory Visit (HOSPITAL_COMMUNITY): Payer: Medicaid Other | Admitting: Physical Therapy

## 2019-07-21 ENCOUNTER — Other Ambulatory Visit: Payer: Self-pay

## 2019-07-21 ENCOUNTER — Encounter (HOSPITAL_COMMUNITY): Payer: Self-pay | Admitting: Physical Therapy

## 2019-07-21 ENCOUNTER — Ambulatory Visit (HOSPITAL_COMMUNITY): Payer: Medicaid Other

## 2019-07-21 ENCOUNTER — Encounter (HOSPITAL_COMMUNITY): Payer: Self-pay

## 2019-07-21 DIAGNOSIS — R29898 Other symptoms and signs involving the musculoskeletal system: Secondary | ICD-10-CM

## 2019-07-21 DIAGNOSIS — R278 Other lack of coordination: Secondary | ICD-10-CM

## 2019-07-21 DIAGNOSIS — M25611 Stiffness of right shoulder, not elsewhere classified: Secondary | ICD-10-CM

## 2019-07-21 DIAGNOSIS — M6281 Muscle weakness (generalized): Secondary | ICD-10-CM

## 2019-07-21 NOTE — Therapy (Signed)
[place], [place], [address] Phone: [phone]   Fax:  [phone]  Physical Therapy Treatment  Patient Details  Name: Evan Chavez MRN: [id number] Date of Birth: 04/12/1961 Referring Provider (PT): Rosita Fire    Encounter Date: 07/21/2019  PT End of Session - 07/21/19 1252    Visit Number  10    Number of Visits  16    Date for PT Re-Evaluation  07/29/19   Progress note complete 9/17 visit #4   Authorization Type  12 visits approved from 9/24-->08/17/19; PN performed on 4th visit --> PN on 14th    Authorization Time Period  06/17/19-->07/29/19    Authorization - Visit Number  10    Authorization - Number of Visits  16    PT Start Time  1310    PT Stop Time  1350    PT Time Calculation (min)  40 min    Equipment Utilized During Treatment  Gait belt    Activity Tolerance  Patient tolerated treatment well;Patient limited by lethargy    Behavior During Therapy  [facility] for tasks assessed/performed       Past Medical History:  Diagnosis Date  . Cocaine abuse ([place])   . ETOH abuse   . GERD (gastroesophageal reflux disease)   . Hyperlipidemia   . Stroke (cerebrum) ([place]) 06/2018   right sided weakness    Past Surgical History:  Procedure Laterality Date  . BIOPSY  08/25/2018   Procedure: BIOPSY;  Surgeon: [name], MD;  [facility];  Service: Endoscopy;;  . BIOPSY  12/13/2018   Procedure: BIOPSY;  Surgeon: [name], MD;  [facility];  Service: Endoscopy;;  esophagus  . COLONOSCOPY WITH PROPOFOL N/A 04/07/2019   Dr. [name]: 3 Tubular adenomas removed (4 to 8 mm in size), next colonoscopy 3 years.  . ESOPHAGOGASTRODUODENOSCOPY (EGD) WITH PROPOFOL N/A 08/25/2018   Dr. [name]: Severely inflamed abnormal appearing mid/distal esophagus.  Encroachment on lumen suspicious for infiltrating neoplasm located be all from severe benign inflammation, extensive gastric erosions and  extensive duodenal ulcerations.  Query ischemic process versus other.  Biopsy revealed Barrett's esophagus but negative for malignancy and H. pylori.  . ESOPHAGOGASTRODUODENOSCOPY (EGD) WITH PROPOFOL N/A 12/13/2018   Dr. [name]: peptic stricture s/p balloon dilatation. abnormal esophageal mucosa, by c/w barrett's without dysplasia, previous PUD completely healed.   Marland Kitchen POLYPECTOMY  04/07/2019   Procedure: POLYPECTOMY;  Surgeon: [name], MD;  [facility];  Service: Endoscopy;;  colon    There were no vitals filed for this visit.  Subjective Assessment - 07/21/19 1315    Subjective  States he felt better after last session. Reports he was a bit tired. No pain.    Pain Score  0-No pain                       [facility] Adult PT Treatment/Exercise - 07/21/19 0001      Knee/Hip Exercises: Aerobic   Other Aerobic  walking with SPC 2x60 ft           Balance Exercises - 07/21/19 1316      Balance Exercises: Standing   Standing, One Foot on a Step  4 inch   with ball switching hands 4x10 B   Other Standing Exercises  cone taps - 2 cone height and 3 cone height  with 4 cone towers 3x2 bilat  PT Short Term Goals - 07/12/19 1407      PT SHORT TERM GOAL #1   Title  PT to be I in a HEP to increase core and Rt LE strength to allow pt to be able to go up 3-5 steps with one hand assist    Baseline  able to perform on 5 inch steps, unable to perform on standard steps    Time  3    Period  Weeks    Status  Achieved    Target Date  07/07/19      PT SHORT TERM GOAL #2   Title  Pt to be able to single leg stance on both LE for at least 10 seconds to decrease risk of falling    Baseline  Rt: 0, Lt 6 seconds    Time  3    Period  Weeks    Status  On-going        PT Long Term Goals - 07/12/19 1407      PT LONG TERM GOAL #1   Title  PT LE stength to have increased by one grade to allow pt to ambulate greater than 320 ft in three minutes using  appropriate assistive device.    Baseline  9/17 298 ft with RW    Time  6    Period  Weeks    Status  On-going      PT LONG TERM GOAL #2   Title  PT to be able to single leg stance for at least 15 seconds on both LE to allow pt to feel confident walking in his home with a single point cane.    Time  6    Period  Weeks    Status  On-going      PT LONG TERM GOAL #3   Title  PT core and LE strength to be improved to allow pt to be able to get in and out of lower chairs/ cars without difficulty.    Time  6    Period  Weeks    Status  On-going      PT LONG TERM GOAL #4   Title  PT to be able to go up and down  8-10(one flight) of steps with a cane and one hand hold.    Time  6    Period  Weeks    Status  Achieved            Plan - 07/21/19 1253    Clinical Impression Statement  Added cone taps to improve precision of movement and to improve SLS time. Patient improved ability to correct for taps that were harder than planned to keep cone from falling over. Practiced ambulation with single point cane. Patient able to safely walk 50 feet with no loss of balance or difficulties. Discussed and educated patient on use of single point cane and patient reported he plans on getting one this week. Patient would continue to benefit from skilled physical therapy focusing on improving functional strength and endurance.    Personal Factors and Comorbidities  Time since onset of injury/illness/exacerbation;Transportation;Social Background    Examination-Activity Limitations  Bathing;Bed Mobility;Carry;Dressing;Stairs;Squat;Locomotion Level;Lift    Examination-Participation Restrictions  Cleaning;Community Activity;Laundry;Yard Work    Stability/Clinical Decision Making  Stable/Uncomplicated    Rehab Potential  Good    PT Frequency  2x / week    PT Duration  8 weeks    PT Treatment/Interventions  ADLs/Self Care Home Management;Gait training;Therapeutic exercise;Therapeutic activities;Manual  techniques;Functional mobility  training;Balance training    PT Next Visit Plan  work on coordination/precision of movement and functional strength/endurance, SLS    PT Home Exercise Plan  LAQ, Sidelying hip abduction, prone knee flexion, prone hip extension, standing sit to stand and heel raises.       Patient will benefit from skilled therapeutic intervention in order to improve the following deficits and impairments:  Abnormal gait, Decreased activity tolerance, Decreased balance, Decreased strength, Difficulty walking, Decreased mobility  Visit Diagnosis: Other lack of coordination  Other symptoms and signs involving the musculoskeletal system  Muscle weakness (generalized)     Problem List Patient Active Problem List   Diagnosis Date Noted  . Barrett's esophagus 06/15/2019  . Peptic stricture of esophagus 03/30/2019  . Anemia 03/30/2019  . Abnormality of esophagus   . Gastritis and gastroduodenitis   . Gastroesophageal reflux disease with esophagitis   . Lobar pneumonia ([place]) 08/22/2018  . Sepsis due to undetermined organism ([place]) 08/22/2018  . Fever   . Precordial pain   . HCAP (healthcare-associated pneumonia) 08/21/2018  . Elevated transaminase level 08/21/2018  . Atrial tachycardia ([place]) 07/13/2018  . Dyslipidemia 07/13/2018  . Acute ischemic stroke ([place]) 07/12/2018  . Essential hypertension 07/12/2018  . Tobacco abuse 07/12/2018  . Cocaine abuse ([place]) 07/12/2018  . Ischemic stroke ([place])     1:48 PM, 07/21/19 Jerene Pitch, DPT Physical Therapy with [facility]  [phone] office  [place] [address] [place], [place], [address] Phone: [phone]   Fax:  [phone]  Name: Evan Chavez MRN: [id number] Date of Birth: 09-10-1961

## 2019-07-21 NOTE — Therapy (Signed)
[place], [place], [address] Phone: [phone]   Fax:  [phone]  Occupational Therapy Treatment  Patient Details  Name: Evan Chavez MRN: [id number] Date of Birth: 1961-05-18 Referring Provider (OT): Dr. Rosita Fire   Encounter Date: 07/21/2019  OT End of Session - 07/21/19 1532    Visit Number  4    Number of Visits  8    Date for OT Re-Evaluation  08/03/19    Authorization Type  Medicaid    Authorization Time Period  3 visits approved 9/30-10/13/20 -----Requesting 8 additional visits on 07/21/2019    Authorization - Visit Number  3    Authorization - Number of Visits  3    OT Start Time  [id number]    OT Stop Time  1515    OT Time Calculation (min)  42 min    Activity Tolerance  Patient tolerated treatment well    Behavior During Therapy  [facility] for tasks assessed/performed       Past Medical History:  Diagnosis Date  . Cocaine abuse ([place])   . ETOH abuse   . GERD (gastroesophageal reflux disease)   . Hyperlipidemia   . Stroke (cerebrum) ([place]) 06/2018   right sided weakness    Past Surgical History:  Procedure Laterality Date  . BIOPSY  08/25/2018   Procedure: BIOPSY;  Surgeon: [name], MD;  [facility];  Service: Endoscopy;;  . BIOPSY  12/13/2018   Procedure: BIOPSY;  Surgeon: [name], MD;  [facility];  Service: Endoscopy;;  esophagus  . COLONOSCOPY WITH PROPOFOL N/A 04/07/2019   Dr. [name]: 3 Tubular adenomas removed (4 to 8 mm in size), next colonoscopy 3 years.  . ESOPHAGOGASTRODUODENOSCOPY (EGD) WITH PROPOFOL N/A 08/25/2018   Dr. [name]: Severely inflamed abnormal appearing mid/distal esophagus.  Encroachment on lumen suspicious for infiltrating neoplasm located be all from severe benign inflammation, extensive gastric erosions and extensive duodenal ulcerations.  Query ischemic process versus other.  Biopsy revealed Barrett's esophagus but negative for  malignancy and H. pylori.  . ESOPHAGOGASTRODUODENOSCOPY (EGD) WITH PROPOFOL N/A 12/13/2018   Dr. [name]: peptic stricture s/p balloon dilatation. abnormal esophageal mucosa, by c/w barrett's without dysplasia, previous PUD completely healed.   Marland Kitchen POLYPECTOMY  04/07/2019   Procedure: POLYPECTOMY;  Surgeon: [name], MD;  [facility];  Service: Endoscopy;;  colon    There were no vitals filed for this visit.  Subjective Assessment - 07/21/19 1446    Subjective   S: It feels stiff.    Currently in Pain?  No/denies         [facility] OT Assessment - 07/21/19 1446      Assessment   Medical Diagnosis  right side weakness      Precautions   Precautions  Fall               OT Treatments/Exercises (OP) - 07/21/19 1447      Exercises   Exercises  Shoulder;Theraputty      Shoulder Exercises: Supine   Protraction  PROM;AROM;10 reps    Horizontal ABduction  PROM;AROM;10 reps    External Rotation  PROM;AROM;10 reps    Internal Rotation  PROM;AROM;10 reps    Flexion  PROM;AROM;10 reps    ABduction  PROM;AROM;10 reps      Theraputty   Theraputty Hand- Locate Pegs  8/8 beads located - red      Functional Reaching Activities   Mid  Level  10 cones placed on 2nd shelf during functional reaching task. Cones removed in same fashion.       Manual Therapy   Manual Therapy  Myofascial release    Manual therapy comments  manual therapy completed seperately from all  other interventions this date.  Myofascial release and manual stretching to right upper arm and shoulder region to decrease pain and fascial restrictions and decrease tightness.      Myofascial Release  Myofascial release and manual stretching completed to right upper arm and trapezius region to decrease fascial restrictions and increase joint mobility in a pain free zone.              OT Education - 07/21/19 1531    Education Details  updated shoulder HEP to include seated A/ROM shoulder exercises. Pt  told to continue with putty exercises for hand strength.    Person(s) Educated  Patient    Methods  Explanation;Handout;Verbal cues    Comprehension  Verbalized understanding       OT Short Term Goals - 07/21/19 1534      OT SHORT TERM GOAL #1   Title  Patient will be educated and independent with HEP with reports of completing 1-2 times a day in order to faciliate progress in therapy and allow patient increased use of his RUE during daily tasks.    Time  2    Period  Weeks    Status  Achieved    Target Date  07/20/19        OT Long Term Goals - 07/21/19 1534      OT LONG TERM GOAL #1   Title  Patient will increase his right shoulder Passive ROM to [facility] in order to complete reaching tasks at shoulder level with less difficulty.    Time  4    Period  Weeks    Status  On-going      OT LONG TERM GOAL #2   Title  Patient will increase his right grip strength by 15# and his pinch strength by 6# in order to be able to hold onto items without dropping.    Time  4    Period  Weeks    Status  On-going      OT LONG TERM GOAL #3   Title  Patient will increase his right shoulder strength to 4/5 overall in order to be able to lift and handle normal household items with less difficulty.    Time  4    Period  Weeks    Status  On-going      OT LONG TERM GOAL #4   Title  Patient will increase his right hand coordination by completing the 9 hole peg test in 40 seconds or less in order to return to using his right hand as his dominant extremity.    Time  4    Period  Weeks    Status  On-going      OT LONG TERM GOAL #5   Title  Pt will increase A/ROM to [facility] to improve ability to perform functional reaching tasks above shoulder level during ADLs.    Time  4    Period  Weeks    Status  On-going            Plan - 07/21/19 1533    Clinical Impression Statement  A: Requsting additional visits from medicaid this date to start on 07/27/19. Patient was able to complete functional  reaching task while placing  cones on 2nd shelf with reports of decreased difficulty after passive stretching. No fascial restrictions noted this date during manual therapy. VC for form and technique were provided.    Body Structure / Function / Physical Skills  ADL;UE functional use;FMC;ROM;GMC;Coordination;IADL;Strength;Dexterity    Plan  P: Focus on hand primarily next session. Grip and pinch strength and coordination task.    Consulted and Agree with Plan of Care  Patient       Patient will benefit from skilled therapeutic intervention in order to improve the following deficits and impairments:   Body Structure / Function / Physical Skills: ADL, UE functional use, FMC, ROM, GMC, Coordination, IADL, Strength, Dexterity       Visit Diagnosis: Stiffness of right shoulder, not elsewhere classified  Other symptoms and signs involving the musculoskeletal system  Other lack of coordination    Problem List Patient Active Problem List   Diagnosis Date Noted  . Barrett's esophagus 06/15/2019  . Peptic stricture of esophagus 03/30/2019  . Anemia 03/30/2019  . Abnormality of esophagus   . Gastritis and gastroduodenitis   . Gastroesophageal reflux disease with esophagitis   . Lobar pneumonia ([place]) 08/22/2018  . Sepsis due to undetermined organism ([place]) 08/22/2018  . Fever   . Precordial pain   . HCAP (healthcare-associated pneumonia) 08/21/2018  . Elevated transaminase level 08/21/2018  . Atrial tachycardia ([place]) 07/13/2018  . Dyslipidemia 07/13/2018  . Acute ischemic stroke ([place]) 07/12/2018  . Essential hypertension 07/12/2018  . Tobacco abuse 07/12/2018  . Cocaine abuse ([place]) 07/12/2018  . Ischemic stroke [facility])    Ailene Ravel, OTR/L,CBIS  [phone]  07/21/2019, 3:35 PM  [place] [address] [place], [place], [address] Phone: [phone]   Fax:  [phone]  Name: Evan Chavez MRN: [id number] Date of Birth:  January 15, 1961

## 2019-07-21 NOTE — Patient Instructions (Signed)
Repeat all exercises 10-15 times, 1-2 times per day.  1) Shoulder Protraction    Begin with elbows by your side, slowly "punch" straight out in front of you.      2) Shoulder Flexion  Sitting:         Begin with arms at your side with thumbs pointed up, slowly raise both arms up and forward towards overhead.     3) Horizontal abduction/adduction   Sitting:           Begin with arms straight out in front of you, bring out to the side in at "T" shape. Keep arms straight entire time.       4) Internal & External Rotation    *No band* -Sit with elbows at the side and elbows bent 90 degrees. Move your forearms away from your body, then bring back inward toward the body.     5) Shoulder Abduction  sitting:       Begin with your arms flat on the table next to your side. Slowly move your arms out to the side so that they go overhead, in a jumping jack or snow angel movement.

## 2019-07-22 ENCOUNTER — Encounter

## 2019-07-25 ENCOUNTER — Encounter (HOSPITAL_COMMUNITY): Payer: Medicaid Other

## 2019-07-26 ENCOUNTER — Other Ambulatory Visit: Payer: Self-pay

## 2019-07-26 ENCOUNTER — Encounter (HOSPITAL_COMMUNITY): Payer: Self-pay | Admitting: Physical Therapy

## 2019-07-26 ENCOUNTER — Ambulatory Visit (HOSPITAL_COMMUNITY): Payer: Medicaid Other | Admitting: Physical Therapy

## 2019-07-26 DIAGNOSIS — R278 Other lack of coordination: Secondary | ICD-10-CM | POA: Diagnosis not present

## 2019-07-26 DIAGNOSIS — R262 Difficulty in walking, not elsewhere classified: Secondary | ICD-10-CM

## 2019-07-26 DIAGNOSIS — M6281 Muscle weakness (generalized): Secondary | ICD-10-CM

## 2019-07-26 NOTE — Therapy (Signed)
[place], [place], [address] Phone: [phone]   Fax:  [phone]  Physical Therapy Treatment  Patient Details  Name: Evan Chavez MRN: [id number] Date of Birth: 10/20/1960 Referring Provider (PT): Rosita Fire    Encounter Date: 07/26/2019  PT End of Session - 07/26/19 1401    Visit Number  11    Number of Visits  16    Date for PT Re-Evaluation  07/29/19   Progress note complete 9/17 visit #4   Authorization Type  12 visits approved from 9/24-->08/17/19; PN performed on 4th visit --> PN on 14th    Authorization Time Period  06/17/19-->07/29/19    Authorization - Visit Number  11    Authorization - Number of Visits  16    PT Start Time  1315    PT Stop Time  1358    PT Time Calculation (min)  43 min    Equipment Utilized During Treatment  Gait belt    Activity Tolerance  Patient tolerated treatment well;Patient limited by lethargy    Behavior During Therapy  [facility] for tasks assessed/performed       Past Medical History:  Diagnosis Date  . Cocaine abuse ([place])   . ETOH abuse   . GERD (gastroesophageal reflux disease)   . Hyperlipidemia   . Stroke (cerebrum) ([place]) 06/2018   right sided weakness    Past Surgical History:  Procedure Laterality Date  . BIOPSY  08/25/2018   Procedure: BIOPSY;  Surgeon: [name], MD;  [facility];  Service: Endoscopy;;  . BIOPSY  12/13/2018   Procedure: BIOPSY;  Surgeon: [name], MD;  [facility];  Service: Endoscopy;;  esophagus  . COLONOSCOPY WITH PROPOFOL N/A 04/07/2019   Dr. [name]: 3 Tubular adenomas removed (4 to 8 mm in size), next colonoscopy 3 years.  . ESOPHAGOGASTRODUODENOSCOPY (EGD) WITH PROPOFOL N/A 08/25/2018   Dr. [name]: Severely inflamed abnormal appearing mid/distal esophagus.  Encroachment on lumen suspicious for infiltrating neoplasm located be all from severe benign inflammation, extensive gastric erosions and  extensive duodenal ulcerations.  Query ischemic process versus other.  Biopsy revealed Barrett's esophagus but negative for malignancy and H. pylori.  . ESOPHAGOGASTRODUODENOSCOPY (EGD) WITH PROPOFOL N/A 12/13/2018   Dr. [name]: peptic stricture s/p balloon dilatation. abnormal esophageal mucosa, by c/w barrett's without dysplasia, previous PUD completely healed.   Marland Kitchen POLYPECTOMY  04/07/2019   Procedure: POLYPECTOMY;  Surgeon: [name], MD;  [facility];  Service: Endoscopy;;  colon    There were no vitals filed for this visit.  Subjective Assessment - 07/26/19 1312    Subjective  PT still has not gotten his cane.  HE states that he is doing his exercises.    Pertinent History  CVA 2019, HTN, substance abuse    Patient Stated Goals  He would like to be able to move around a lot better.    Currently in Pain?  No/denies    Pain Onset  More than a month ago               [facility] Adult PT Treatment/Exercise - 07/26/19 0001      Ambulation/Gait   Ambulation/Gait  Yes    Ambulation/Gait Assistance  5: Supervision    Ambulation Distance (Feet)  226 Feet    Assistive device  None    Gait Pattern  Decreased arm swing - right;Decreased step length - left;Decreased stride length;Decreased weight shift  to right;Right foot flat    Gait Comments  one rest break - walked 180 feet in 2 minutes then rested       Exercises   Exercises  Knee/Hip      Knee/Hip Exercises: Aerobic   Nustep  --      Knee/Hip Exercises: Machines for Strengthening   Total Gym Leg Press  --      Knee/Hip Exercises: Standing   Forward Lunges  15 reps    Lateral Step Up  Right;15 reps;Hand Hold: 1;Step Height: 6"    Forward Step Up  Right;Step Height: 6";15 reps    Other Standing Knee Exercises  grn Tband on LE , washcloth under foot slide Abduction, extension 10x  Bilaterally.     Other Standing Knee Exercises  --          Balance Exercises - 07/26/19 1337      Balance Exercises: Standing    Sidestepping  2 reps;Theraband   GRn   Step Over Hurdles / Cones  6 and 12 " hurdles.      Sit to Stand Time Marching   10 10          PT Short Term Goals - 07/12/19 1407      PT SHORT TERM GOAL #1   Title  PT to be I in a HEP to increase core and Rt LE strength to allow pt to be able to go up 3-5 steps with one hand assist    Baseline  able to perform on 5 inch steps, unable to perform on standard steps    Time  3    Period  Weeks    Status  Achieved    Target Date  07/07/19      PT SHORT TERM GOAL #2   Title  Pt to be able to single leg stance on both LE for at least 10 seconds to decrease risk of falling    Baseline  Rt: 0, Lt 6 seconds    Time  3    Period  Weeks    Status  On-going        PT Long Term Goals - 07/12/19 1407      PT LONG TERM GOAL #1   Title  PT LE stength to have increased by one grade to allow pt to ambulate greater than 320 ft in three minutes using appropriate assistive device.    Baseline  9/17 298 ft with RW    Time  6    Period  Weeks    Status  On-going      PT LONG TERM GOAL #2   Title  PT to be able to single leg stance for at least 15 seconds on both LE to allow pt to feel confident walking in his home with a single point cane.    Time  6    Period  Weeks    Status  On-going      PT LONG TERM GOAL #3   Title  PT core and LE strength to be improved to allow pt to be able to get in and out of lower chairs/ cars without difficulty.    Time  6    Period  Weeks    Status  On-going      PT LONG TERM GOAL #4   Title  PT to be able to go up and down  8-10(one flight) of steps with a cane and one hand hold.    Time  6    Period  Weeks    Status  Achieved            Plan - 07/26/19 1401    Clinical Impression Statement  PT still has not purchased a single point cane.  Today's treatment focused on improving balance as well as coordination. Added hurdles and resisisted abduction and extension.    Personal Factors and  Comorbidities  Time since onset of injury/illness/exacerbation;Transportation;Social Background    Examination-Activity Limitations  Bathing;Bed Mobility;Carry;Dressing;Stairs;Squat;Locomotion Level;Lift    Examination-Participation Restrictions  Cleaning;Community Activity;Laundry;Yard Work    Stability/Clinical Decision Making  Stable/Uncomplicated    Rehab Potential  Good    PT Frequency  2x / week    PT Duration  8 weeks    PT Treatment/Interventions  ADLs/Self Care Home Management;Gait training;Therapeutic exercise;Therapeutic activities;Manual techniques;Functional mobility training;Balance training    PT Next Visit Plan  work on coordination/precision of movement and functional strength/endurance, SLS    PT Home Exercise Plan  LAQ, Sidelying hip abduction, prone knee flexion, prone hip extension, standing sit to stand and heel raises.       Patient will benefit from skilled therapeutic intervention in order to improve the following deficits and impairments:  Abnormal gait, Decreased activity tolerance, Decreased balance, Decreased strength, Difficulty walking, Decreased mobility  Visit Diagnosis: Other lack of coordination  Muscle weakness (generalized)  Difficulty in walking, not elsewhere classified     Problem List Patient Active Problem List   Diagnosis Date Noted  . Barrett's esophagus 06/15/2019  . Peptic stricture of esophagus 03/30/2019  . Anemia 03/30/2019  . Abnormality of esophagus   . Gastritis and gastroduodenitis   . Gastroesophageal reflux disease with esophagitis   . Lobar pneumonia ([place]) 08/22/2018  . Sepsis due to undetermined organism ([place]) 08/22/2018  . Fever   . Precordial pain   . HCAP (healthcare-associated pneumonia) 08/21/2018  . Elevated transaminase level 08/21/2018  . Atrial tachycardia ([place]) 07/13/2018  . Dyslipidemia 07/13/2018  . Acute ischemic stroke ([place]) 07/12/2018  . Essential hypertension 07/12/2018  . Tobacco abuse 07/12/2018  .  Cocaine abuse ([place]) 07/12/2018  . Ischemic stroke [facility])     Rayetta Humphrey, PT CLT [phone] 07/26/2019, 2:03 PM  [place] [address] [place], [place], [address] Phone: [phone]   Fax:  [phone]  Name: Evan Chavez MRN: [id number] Date of Birth: 1961/02/03

## 2019-07-27 ENCOUNTER — Ambulatory Visit (HOSPITAL_COMMUNITY): Payer: Medicaid Other

## 2019-07-27 ENCOUNTER — Encounter (HOSPITAL_COMMUNITY): Payer: Self-pay

## 2019-07-27 DIAGNOSIS — M25611 Stiffness of right shoulder, not elsewhere classified: Secondary | ICD-10-CM

## 2019-07-27 DIAGNOSIS — R278 Other lack of coordination: Secondary | ICD-10-CM | POA: Diagnosis not present

## 2019-07-27 DIAGNOSIS — R29898 Other symptoms and signs involving the musculoskeletal system: Secondary | ICD-10-CM

## 2019-07-27 NOTE — Therapy (Signed)
[place], [place], [address] Phone: [phone]   Fax:  [phone]  Occupational Therapy Treatment  Patient Details  Name: Evan Chavez MRN: [id number] Date of Birth: 05-17-1961 Referring Provider (OT): Dr. Rosita Fire   Encounter Date: 07/27/2019  OT End of Session - 07/27/19 1604    Visit Number  5    Number of Visits  8    Date for OT Re-Evaluation  08/03/19    Authorization Type  Medicaid    Authorization Time Period  8 visits approved (07/27/19-08/23/19)    Authorization - Visit Number  1    Authorization - Number of Visits  8    OT Start Time  [id number]    OT Stop Time  1510    OT Time Calculation (min)  38 min    Activity Tolerance  Patient tolerated treatment well    Behavior During Therapy  [facility] for tasks assessed/performed       Past Medical History:  Diagnosis Date  . Cocaine abuse ([place])   . ETOH abuse   . GERD (gastroesophageal reflux disease)   . Hyperlipidemia   . Stroke (cerebrum) ([place]) 06/2018   right sided weakness    Past Surgical History:  Procedure Laterality Date  . BIOPSY  08/25/2018   Procedure: BIOPSY;  Surgeon: [name], MD;  [facility];  Service: Endoscopy;;  . BIOPSY  12/13/2018   Procedure: BIOPSY;  Surgeon: [name], MD;  [facility];  Service: Endoscopy;;  esophagus  . COLONOSCOPY WITH PROPOFOL N/A 04/07/2019   Dr. [name]: 3 Tubular adenomas removed (4 to 8 mm in size), next colonoscopy 3 years.  . ESOPHAGOGASTRODUODENOSCOPY (EGD) WITH PROPOFOL N/A 08/25/2018   Dr. [name]: Severely inflamed abnormal appearing mid/distal esophagus.  Encroachment on lumen suspicious for infiltrating neoplasm located be all from severe benign inflammation, extensive gastric erosions and extensive duodenal ulcerations.  Query ischemic process versus other.  Biopsy revealed Barrett's esophagus but negative for malignancy and H. pylori.  .  ESOPHAGOGASTRODUODENOSCOPY (EGD) WITH PROPOFOL N/A 12/13/2018   Dr. [name]: peptic stricture s/p balloon dilatation. abnormal esophageal mucosa, by c/w barrett's without dysplasia, previous PUD completely healed.   Marland Kitchen POLYPECTOMY  04/07/2019   Procedure: POLYPECTOMY;  Surgeon: [name], MD;  [facility];  Service: Endoscopy;;  colon    There were no vitals filed for this visit.  Subjective Assessment - 07/27/19 1442    Subjective   S: No pain.    Currently in Pain?  No/denies         [facility] OT Assessment - 07/27/19 1442      Assessment   Medical Diagnosis  right side weakness      Precautions   Precautions  Fall               OT Treatments/Exercises (OP) - 07/27/19 1442      Exercises   Exercises  Theraputty;Hand      Hand Exercises   Hand Gripper with Large Beads  all beads with gripper set at 35#   horizontal   Hand Gripper with Medium Beads  all beads with gripper set at 35#   horizontal   Hand Gripper with Small Beads  all beads with gripper set at 29#   horizontal   Sponges  9,11    Other Hand Exercises  pt utilized a red resistive clothespin with a 3 point pinch to pick up and  transfer 30 sponges from table to container lid. Focused on pinch strength and reaching with RUE.       Fine Motor Coordination (Hand/Wrist)   Fine Motor Coordination  Manipulation of small objects    Manipulation of small objects  Patient manipulated small sequence chips while picking them up one at a time (3 total) and stacking them 3 tall. Max difficulty.                OT Short Term Goals - 07/27/19 1608      OT SHORT TERM GOAL #1   Title  Patient will be educated and independent with HEP with reports of completing 1-2 times a day in order to faciliate progress in therapy and allow patient increased use of his RUE during daily tasks.    Time  2    Period  Weeks    Target Date  07/20/19        OT Long Term Goals - 07/21/19 1534      OT LONG TERM  GOAL #1   Title  Patient will increase his right shoulder Passive ROM to [facility] in order to complete reaching tasks at shoulder level with less difficulty.    Time  4    Period  Weeks    Status  On-going      OT LONG TERM GOAL #2   Title  Patient will increase his right grip strength by 15# and his pinch strength by 6# in order to be able to hold onto items without dropping.    Time  4    Period  Weeks    Status  On-going      OT LONG TERM GOAL #3   Title  Patient will increase his right shoulder strength to 4/5 overall in order to be able to lift and handle normal household items with less difficulty.    Time  4    Period  Weeks    Status  On-going      OT LONG TERM GOAL #4   Title  Patient will increase his right hand coordination by completing the 9 hole peg test in 40 seconds or less in order to return to using his right hand as his dominant extremity.    Time  4    Period  Weeks    Status  On-going      OT LONG TERM GOAL #5   Title  Pt will increase A/ROM to [facility] to improve ability to perform functional reaching tasks above shoulder level during ADLs.    Time  4    Period  Weeks    Status  On-going            Plan - 07/27/19 1605    Clinical Impression Statement  A: Session focused on mainly hand strength and coordination. Patient required increased time for all activities due to hand weakness. Rest breaks were taken as needed for fatigue.    Body Structure / Function / Physical Skills  ADL;UE functional use;FMC;ROM;GMC;Coordination;IADL;Strength;Dexterity    Plan  P: Resume functional reaching tasks for shoulder while increasing pinch strength during resistive clothespins.    Consulted and Agree with Plan of Care  Patient       Patient will benefit from skilled therapeutic intervention in order to improve the following deficits and impairments:   Body Structure / Function / Physical Skills: ADL, UE functional use, FMC, ROM, GMC, Coordination, IADL, Strength,  Dexterity       Visit Diagnosis:  Other lack of coordination  Other symptoms and signs involving the musculoskeletal system  Stiffness of right shoulder, not elsewhere classified    Problem List Patient Active Problem List   Diagnosis Date Noted  . Barrett's esophagus 06/15/2019  . Peptic stricture of esophagus 03/30/2019  . Anemia 03/30/2019  . Abnormality of esophagus   . Gastritis and gastroduodenitis   . Gastroesophageal reflux disease with esophagitis   . Lobar pneumonia ([place]) 08/22/2018  . Sepsis due to undetermined organism ([place]) 08/22/2018  . Fever   . Precordial pain   . HCAP (healthcare-associated pneumonia) 08/21/2018  . Elevated transaminase level 08/21/2018  . Atrial tachycardia ([place]) 07/13/2018  . Dyslipidemia 07/13/2018  . Acute ischemic stroke ([place]) 07/12/2018  . Essential hypertension 07/12/2018  . Tobacco abuse 07/12/2018  . Cocaine abuse ([place]) 07/12/2018  . Ischemic stroke [facility])    Ailene Ravel, OTR/L,CBIS  [phone]  07/27/2019, 4:09 PM  [place] [address] [place], [place], [address] Phone: [phone]   Fax:  [phone]  Name: Evan Chavez MRN: [id number] Date of Birth: [birth year]

## 2019-07-28 ENCOUNTER — Other Ambulatory Visit: Payer: Self-pay

## 2019-07-28 ENCOUNTER — Ambulatory Visit (HOSPITAL_COMMUNITY): Payer: Medicaid Other | Admitting: Physical Therapy

## 2019-07-28 ENCOUNTER — Ambulatory Visit (HOSPITAL_COMMUNITY): Payer: Medicaid Other | Admitting: Occupational Therapy

## 2019-07-28 ENCOUNTER — Encounter (HOSPITAL_COMMUNITY): Payer: Self-pay | Admitting: Physical Therapy

## 2019-07-28 DIAGNOSIS — R29898 Other symptoms and signs involving the musculoskeletal system: Secondary | ICD-10-CM

## 2019-07-28 DIAGNOSIS — R262 Difficulty in walking, not elsewhere classified: Secondary | ICD-10-CM

## 2019-07-28 DIAGNOSIS — R278 Other lack of coordination: Secondary | ICD-10-CM | POA: Diagnosis not present

## 2019-07-28 DIAGNOSIS — M6281 Muscle weakness (generalized): Secondary | ICD-10-CM

## 2019-07-28 NOTE — Therapy (Signed)
[facility] [address] [place], [place], [address] Phone: [phone]   Fax:  [phone]  Physical Therapy Treatment  Patient Details  Name: Evan Chavez MRN: [id number] Date of Birth: December 19, 1960 Referring Provider (PT): Rosita Fire    Encounter Date: 07/28/2019  PT End of Session - 07/28/19 1339    Visit Number  12    Number of Visits  16    Date for PT Re-Evaluation  07/29/19   Progress note complete 9/17 visit #4   Authorization Type  12 visits approved from 9/24-->08/17/19; PN performed on 4th visit --> PN on 14th    Authorization Time Period  06/17/19-->07/29/19    Authorization - Visit Number  12    Authorization - Number of Visits  16    PT Start Time  1517    PT Stop Time  1402    PT Time Calculation (min)  1365 min    Equipment Utilized During Treatment  Gait belt    Activity Tolerance  Patient tolerated treatment well;Patient limited by lethargy    Behavior During Therapy  [facility] for tasks assessed/performed       Past Medical History:  Diagnosis Date  . Cocaine abuse ([place])   . ETOH abuse   . GERD (gastroesophageal reflux disease)   . Hyperlipidemia   . Stroke (cerebrum) ([place]) 06/2018   right sided weakness    Past Surgical History:  Procedure Laterality Date  . BIOPSY  08/25/2018   Procedure: BIOPSY;  Surgeon: [name], MD;  [facility];  Service: Endoscopy;;  . BIOPSY  12/13/2018   Procedure: BIOPSY;  Surgeon: [name], MD;  [facility];  Service: Endoscopy;;  esophagus  . COLONOSCOPY WITH PROPOFOL N/A 04/07/2019   Dr. [name]: 3 Tubular adenomas removed (4 to 8 mm in size), next colonoscopy 3 years.  . ESOPHAGOGASTRODUODENOSCOPY (EGD) WITH PROPOFOL N/A 08/25/2018   Dr. [name]: Severely inflamed abnormal appearing mid/distal esophagus.  Encroachment on lumen suspicious for infiltrating neoplasm located be all from severe benign inflammation, extensive gastric erosions and  extensive duodenal ulcerations.  Query ischemic process versus other.  Biopsy revealed Barrett's esophagus but negative for malignancy and H. pylori.  . ESOPHAGOGASTRODUODENOSCOPY (EGD) WITH PROPOFOL N/A 12/13/2018   Dr. [name]: peptic stricture s/p balloon dilatation. abnormal esophageal mucosa, by c/w barrett's without dysplasia, previous PUD completely healed.   Marland Kitchen POLYPECTOMY  04/07/2019   Procedure: POLYPECTOMY;  Surgeon: [name], MD;  [facility];  Service: Endoscopy;;  colon    There were no vitals filed for this visit.  Subjective Assessment - 07/28/19 1320    Subjective  PT comes in walking with a SPC< states that he is hurting in his shoulder a little, OT will address this.    Pertinent History  CVA 2019, HTN, substance abuse    Patient Stated Goals  He would like to be able to move around a lot better.    Pain Onset  More than a month ago                       [facility] Adult PT Treatment/Exercise - 07/28/19 0001      Exercises   Exercises  Knee/Hip      Knee/Hip Exercises: Machines for Strengthening   Total Gym Leg Press  4 Pl x 15       Knee/Hip Exercises: Standing   Heel Raises  Both;15 reps  Heel Raises Limitations  combined with squats     Forward Lunges  15 reps    Lateral Step Up  Right;15 reps;Hand Hold: 1;Step Height: 6"    Forward Step Up  Right;Step Height: 6";15 reps;Hand Hold: 0    Other Standing Knee Exercises  grn Tband on LE , washcloth under foot slide Abduction, extension 10x  Bilaterally.           Balance Exercises - 07/28/19 1338      Balance Exercises: Standing   Tandem Stance  Eyes open;5 reps   with head turns    SLS  Eyes open;3 reps   Rt 3" Lt 5 "   Standing, One Foot on a Step  Eyes open;4 inch   switching ball hand to hand x 10 with each foot on step    Tandem Gait  Forward;2 reps    Sidestepping  2 reps;Theraband   GRn   Marching Limitations  15    Sit to Stand Time  15          PT Short Term  Goals - 07/12/19 1407      PT SHORT TERM GOAL #1   Title  PT to be I in a HEP to increase core and Rt LE strength to allow pt to be able to go up 3-5 steps with one hand assist    Baseline  able to perform on 5 inch steps, unable to perform on standard steps    Time  3    Period  Weeks    Status  Achieved    Target Date  07/07/19      PT SHORT TERM GOAL #2   Title  Pt to be able to single leg stance on both LE for at least 10 seconds to decrease risk of falling    Baseline  Rt: 0, Lt 6 seconds    Time  3    Period  Weeks    Status  On-going        PT Long Term Goals - 07/28/19 1404      PT LONG TERM GOAL #1   Title  PT LE stength to have increased by one grade to allow pt to ambulate greater than 320 ft in three minutes using appropriate assistive device.    Baseline  9/17 298 ft with RW    Time  6    Period  Weeks    Status  Achieved      PT LONG TERM GOAL #2   Title  PT to be able to single leg stance for at least 15 seconds on both LE to allow pt to feel confident walking in his home with a single point cane.    Time  6    Period  Weeks    Status  Achieved      PT LONG TERM GOAL #3   Title  PT core and LE strength to be improved to allow pt to be able to get in and out of lower chairs/ cars without difficulty.    Time  6    Period  Weeks    Status  On-going      PT LONG TERM GOAL #4   Title  PT to be able to go up and down  8-10(one flight) of steps with a cane and one hand hold.    Time  6    Period  Weeks    Status  Achieved  Plan - 07/28/19 1402    Clinical Impression Statement  PT enters department using a single point cane,  Treatment continues to focus on strength and balance.  PT with noted improved confidence while walking.    Personal Factors and Comorbidities  Time since onset of injury/illness/exacerbation;Transportation;Social Background    Examination-Activity Limitations  Bathing;Bed Mobility;Carry;Dressing;Stairs;Squat;Locomotion  Level;Lift    Examination-Participation Restrictions  Cleaning;Community Activity;Laundry;Yard Work    Stability/Clinical Decision Making  Stable/Uncomplicated    Rehab Potential  Good    PT Frequency  2x / week    PT Duration  8 weeks    PT Treatment/Interventions  ADLs/Self Care Home Management;Gait training;Therapeutic exercise;Therapeutic activities;Manual techniques;Functional mobility training;Balance training    PT Next Visit Plan  Walking with wt in Rt hand with no assistive device.    PT Home Exercise Plan  LAQ, Sidelying hip abduction, prone knee flexion, prone hip extension, standing sit to stand and heel raises.       Patient will benefit from skilled therapeutic intervention in order to improve the following deficits and impairments:  Abnormal gait, Decreased activity tolerance, Decreased balance, Decreased strength, Difficulty walking, Decreased mobility  Visit Diagnosis: Muscle weakness (generalized)  Difficulty in walking, not elsewhere classified     Problem List Patient Active Problem List   Diagnosis Date Noted  . Barrett's esophagus 06/15/2019  . Peptic stricture of esophagus 03/30/2019  . Anemia 03/30/2019  . Abnormality of esophagus   . Gastritis and gastroduodenitis   . Gastroesophageal reflux disease with esophagitis   . Lobar pneumonia ([place]) 08/22/2018  . Sepsis due to undetermined organism ([place]) 08/22/2018  . Fever   . Precordial pain   . HCAP (healthcare-associated pneumonia) 08/21/2018  . Elevated transaminase level 08/21/2018  . Atrial tachycardia ([place]) 07/13/2018  . Dyslipidemia 07/13/2018  . Acute ischemic stroke ([place]) 07/12/2018  . Essential hypertension 07/12/2018  . Tobacco abuse 07/12/2018  . Cocaine abuse ([place]) 07/12/2018  . Ischemic stroke [facility])    Rayetta Humphrey, PT CLT [phone] 07/28/2019, 2:05 PM  [place] [address] [place], [place], [address] Phone: [phone]   Fax:   [phone]  Name: Evan Chavez MRN: [id number] Date of Birth: 1961-07-17

## 2019-07-28 NOTE — Therapy (Signed)
[place], [place], [address] Phone: [phone]   Fax:  [phone]  Patient Details  Name: Evan Chavez MRN: [id number] Date of Birth: [birth year] Referring Provider:  Rosita Fire, MD  Encounter Date: 07/28/2019   Cancellation Note Pt checked in for PT and OT appts. After PT appt pt had an accident and needed to change clothes. Pt arrived back at clinic at 3:00pm (appt time was 2:30). OT provided updated schedule and discussed with pt and mother who verbalized understanding.   [name], OTR/L  [phone] 07/28/2019, 5:36 PM  [place] [address] [place], [place], [address] Phone: [phone]   Fax:  [phone]

## 2019-08-03 ENCOUNTER — Ambulatory Visit (HOSPITAL_COMMUNITY): Payer: Medicaid Other

## 2019-08-03 ENCOUNTER — Other Ambulatory Visit: Payer: Self-pay

## 2019-08-03 DIAGNOSIS — R278 Other lack of coordination: Secondary | ICD-10-CM

## 2019-08-03 DIAGNOSIS — R29898 Other symptoms and signs involving the musculoskeletal system: Secondary | ICD-10-CM

## 2019-08-04 ENCOUNTER — Encounter (HOSPITAL_COMMUNITY): Payer: Self-pay | Admitting: Physical Therapy

## 2019-08-04 ENCOUNTER — Ambulatory Visit (HOSPITAL_COMMUNITY): Payer: Medicaid Other | Admitting: Physical Therapy

## 2019-08-04 DIAGNOSIS — R278 Other lack of coordination: Secondary | ICD-10-CM

## 2019-08-04 DIAGNOSIS — M6281 Muscle weakness (generalized): Secondary | ICD-10-CM

## 2019-08-04 DIAGNOSIS — R262 Difficulty in walking, not elsewhere classified: Secondary | ICD-10-CM

## 2019-08-04 NOTE — Patient Instructions (Addendum)
Feet Heel-Toe "Tandem", Head Motion - Eyes Open    With eyes open, right foot directly in front of the other, move head slowly: up and down. Repeat ___5_ times per session. Do __2__ sessions per day.  Copyright  VHI. All rights reserved.  Single Leg - Eyes Open    Holding support, lift right leg while maintaining balance over other leg. Progress to removing hands from support surface for longer periods of time. Hold__goal of 10-15 __ seconds. Repeat _3___ times per session. Do __2__ sessions per day.  Copyright  VHI. All rights reserved.

## 2019-08-04 NOTE — Therapy (Signed)
[place], [place], [address] Phone: [phone]   Fax:  [phone]  Occupational Therapy Treatment reassessment Patient Details  Name: Evan Chavez MRN: [id number] Date of Birth: 08-06-1961 Referring Provider (OT): Dr. Rosita Fire   Encounter Date: 08/03/2019  OT End of Session - 08/04/19 1137    Visit Number  6    Number of Visits  10    Date for OT Re-Evaluation  08/23/19    Authorization Type  Medicaid    Authorization Time Period  8 visits approved (07/27/19-08/23/19)    Authorization - Visit Number  2    Authorization - Number of Visits  8    OT Start Time  1301    OT Stop Time  1339    OT Time Calculation (min)  38 min    Activity Tolerance  Patient tolerated treatment well    Behavior During Therapy  [facility] for tasks assessed/performed       Past Medical History:  Diagnosis Date  . Cocaine abuse ([place])   . ETOH abuse   . GERD (gastroesophageal reflux disease)   . Hyperlipidemia   . 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 BIOPSY  08/25/2018   Procedure: BIOPSY;  Surgeon: [name], MD;  [facility];  Service: Endoscopy;;  . BIOPSY  12/13/2018   Procedure: BIOPSY;  Surgeon: [name], MD;  [facility];  Service: Endoscopy;;  esophagus  . COLONOSCOPY WITH PROPOFOL N/A 04/07/2019   Dr. [name]: 3 Tubular adenomas removed (4 to 8 mm in size), next colonoscopy 3 years.  . ESOPHAGOGASTRODUODENOSCOPY (EGD) WITH PROPOFOL N/A 08/25/2018   Dr. [name]: Severely inflamed abnormal appearing mid/distal esophagus.  Encroachment on lumen suspicious for infiltrating neoplasm located be all from severe benign inflammation, extensive gastric erosions and extensive duodenal ulcerations.  Query ischemic process versus other.  Biopsy revealed Barrett's esophagus but negative for malignancy and H. pylori.  .  ESOPHAGOGASTRODUODENOSCOPY (EGD) WITH PROPOFOL N/A 12/13/2018   Dr. [name]: peptic stricture s/p balloon dilatation. abnormal esophageal mucosa, by c/w barrett's without dysplasia, previous PUD completely healed.   Marland Kitchen POLYPECTOMY  04/07/2019   Procedure: POLYPECTOMY;  Surgeon: [name], MD;  [facility];  Service: Endoscopy;;  colon    There were no vitals filed for this visit.  Subjective Assessment - 08/03/19 1328    Subjective   S: I feel like my coordination and hand strength is better than it was.    Currently in Pain?  Yes    Pain Score  3     Pain Location  Shoulder    Pain Orientation  Right    Pain Descriptors / Indicators  Aching    Pain Type  Acute pain    Pain Radiating Towards  N/A    Pain Onset  More than a month ago    Pain Frequency  Intermittent    Aggravating Factors   Increased use    Pain Relieving Factors  unsure    Effect of Pain on Daily Activities  No effect. Pushes through the pain    Multiple Pain Sites  No         [facility] OT Assessment - 08/03/19 1307      Assessment   Medical Diagnosis  right side weakness    Referring Provider (OT)  Dr. Rosita Fire  Onset Date/Surgical Date  05/15/18      Precautions   Precautions  [place] expects to be discharged to:  Private residence      Prior Function   Level of Independence  Independent with household mobility with device      Coordination   9 [facility] Peg Test  Right    Right 9 Hole Peg Test  55.8"   previous: 56.4"     ROM / Strength   AROM / PROM / Strength  Strength      AROM   Overall AROM   Other (comment)    Overall AROM Comments  Assessed seated. Visually, patient was able to demonstrate functional ROM although once therapist attempted to measure ROM, he demonstrated less A/ROM than was measured on evaluation.       Strength   Right Shoulder Flexion  4/5   previous: 3-/5   Right Shoulder ABduction  3+/5   previous: 3-/5   Right  Shoulder Internal Rotation  5/5   previous: same   Right Shoulder External Rotation  4-/5   previous: 4-/   Right/Left hand  Right    Right Hand Grip (lbs)  50   previous: 45   Right Hand Lateral Pinch  11 lbs   previous: 9   Right Hand 3 Point Pinch  9 lbs   previous: 7              OT Treatments/Exercises (OP) - 08/03/19 1325      Exercises   Exercises  Theraputty;Hand      Fine Motor Coordination (Hand/Wrist)   Fine Motor Coordination  Flipping cards;Grooved pegs;Dealing card with thumb    Flipping cards  Using his right hand, flipped one card over at a time focusing on smooth motor coordination.     Dealing card with thumb  Patient able to deal 5 cards from deck using his left thumb with max difficulty. Due to lack of mobility in his thumb DIP joint, task presented with max difficulty    Grooved pegs  Pt completed grooved pegboard to focus on coordination of his righ hand while manipulating pegs using primarily his right hand only. 10 pegs placed within time limit.              OT Education - 08/04/19 1137    Education Details  Reviewed therapy goals and progress in therapy. Made recommendations for continuing therapy for 2.5 more weeks.    Person(s) Educated  Patient    Methods  Explanation    Comprehension  Verbalized understanding       OT Short Term Goals - 07/27/19 1608      OT SHORT TERM GOAL #1   Title  Patient will be educated and independent with HEP with reports of completing 1-2 times a day in order to faciliate progress in therapy and allow patient increased use of his RUE during daily tasks.    Time  2    Period  Weeks    Target Date  07/20/19        OT Long Term Goals - 08/03/19 1320      OT LONG TERM GOAL #1   Title  Patient will increase his right shoulder Passive ROM to [facility] in order to complete reaching tasks at shoulder level with less difficulty.    Time  4    Period  Weeks    Status  On-going  OT LONG TERM GOAL #2    Title  Patient will increase his right grip strength by 15# and his pinch strength by 6# in order to be able to hold onto items without dropping.    Time  4    Period  Weeks    Status  On-going      OT LONG TERM GOAL #3   Title  Patient will increase his right shoulder strength to 4/5 overall in order to be able to lift and handle normal household items with less difficulty.    Time  4    Period  Weeks    Status  On-going      OT LONG TERM GOAL #4   Title  Patient will increase his right hand coordination by completing the 9 hole peg test in 40 seconds or less in order to return to using his right hand as his dominant extremity.    Time  4    Period  Weeks    Status  On-going      OT LONG TERM GOAL #5   Title  Pt will increase A/ROM to [facility] to improve ability to perform functional reaching tasks above shoulder level during ADLs.    Time  4    Period  Weeks    Status  On-going            Plan - 08/04/19 1139    Clinical Impression Statement  A: reassessment completed this date. patient has made progress in therapy although slowly. He has demonstrated an increased in hand strenght, shoulder strength. and functional ROM. Functional ROM is visually better although when measurements were taken, patient is unable to demonstrate the ROM previously displayed. Recommended patient continue with OT services 2 times a week for 2.5 more weeks to utilize all 8 visits approved by Medicaid and to focus on hand and shoulder strength and increasing ROM to allow him to complete daily tasks with decreased difficulty.    Body Structure / Function / Physical Skills  ADL;UE functional use;FMC;ROM;GMC;Coordination;IADL;Strength;Dexterity    Plan  P: Next session, begin with shoulder. Complete passive ROM to stretch followed by A/ROM supine. Attempt wall wash, PVC pipe slide, and overhead lacing.    Consulted and Agree with Plan of Care  Patient       Patient will benefit from skilled therapeutic  intervention in order to improve the following deficits and impairments:   Body Structure / Function / Physical Skills: ADL, UE functional use, FMC, ROM, GMC, Coordination, IADL, Strength, Dexterity       Visit Diagnosis: Other symptoms and signs involving the musculoskeletal system - Plan: Ot plan of care cert/re-cert  Other lack of coordination - Plan: Ot plan of care cert/re-cert    Problem List Patient Active Problem List   Diagnosis Date Noted  . Barrett's esophagus 06/15/2019  . Peptic stricture of esophagus 03/30/2019  . Anemia 03/30/2019  . Abnormality of esophagus   . Gastritis and gastroduodenitis   . Gastroesophageal reflux disease with esophagitis   . Lobar pneumonia ([place]) 08/22/2018  . Sepsis due to undetermined organism ([place]) 08/22/2018  . Fever   . Precordial pain   . HCAP (healthcare-associated pneumonia) 08/21/2018  . Elevated transaminase level 08/21/2018  . Atrial tachycardia ([place]) 07/13/2018  . Dyslipidemia 07/13/2018  . Acute ischemic stroke ([place]) 07/12/2018  . Essential hypertension 07/12/2018  . Tobacco abuse 07/12/2018  . Cocaine abuse ([place]) 07/12/2018  . Ischemic stroke ([place])    Ailene Ravel, OTR/L,CBIS  [phone]  08/04/2019, 11:45 AM  [place] [address] [place], [place], [address] Phone: [phone]   Fax:  [phone]  Name: Evan Chavez MRN: [id number] Date of Birth: [birth year]

## 2019-08-04 NOTE — Therapy (Signed)
[place] [address] [place], [place], [address] Phone: [phone]   Fax:  [phone]  Physical Therapy Treatment  Patient Details  Name: Evan Chavez MRN: [id number] Date of Birth: 07-19-1961 Referring Provider (PT): [place] SUMMARY  Visits from Start of Care: 13  Current functional level related to goals / functional outcomes: Pt now ambulating with single point cane was rolling walker    Remaining deficits: Balance and strength.    Education / Equipment: HEP Plan: Patient agrees to discharge.  Patient goals were not met. Patient is being discharged due to meeting the stated rehab goals.  ?????      Encounter Date: 08/04/2019  PT End of Session - 08/04/19 1433    Visit Number  13    Number of Visits  13    Date for PT Re-Evaluation  07/29/19   Progress note complete 9/17 visit #4   Authorization Type  12 visits approved from 9/24-->08/17/19; PN performed on 4th visit --> PN on 14th    Authorization Time Period  10/19/38-->81/44/81; recert for 85/63    Authorization - Visit Number  13    Authorization - Number of Visits  16    PT Start Time  1400    PT Stop Time  1428    PT Time Calculation (min)  28 min    Activity Tolerance  Patient tolerated treatment well    Behavior During Therapy  WFL for tasks assessed/performed       Past Medical History:  Diagnosis Date  . Cocaine abuse ([place])   . ETOH abuse   . GERD (gastroesophageal reflux disease)   . Hyperlipidemia   . Stroke (cerebrum) ([place]) 06/2018   right sided weakness    Past Surgical History:  Procedure Laterality Date  . BIOPSY  08/25/2018   Procedure: BIOPSY;  Surgeon: [name], MD;  [facility];  Service: Endoscopy;;  . BIOPSY  12/13/2018   Procedure: BIOPSY;  Surgeon: [name], MD;  [facility];  Service: Endoscopy;;  esophagus  . COLONOSCOPY WITH PROPOFOL N/A 04/07/2019   Dr. [name]: 3 Tubular adenomas removed (4 to 8 mm in size), next colonoscopy 3 years.  . ESOPHAGOGASTRODUODENOSCOPY (EGD) WITH PROPOFOL N/A 08/25/2018   Dr. [name]: Severely inflamed abnormal appearing mid/distal esophagus.  Encroachment on lumen suspicious for infiltrating neoplasm located be all from severe benign inflammation, extensive gastric erosions and extensive duodenal ulcerations.  Query ischemic process versus other.  Biopsy revealed Barrett's esophagus but negative for malignancy and H. pylori.  . ESOPHAGOGASTRODUODENOSCOPY (EGD) WITH PROPOFOL N/A 12/13/2018   Dr. [name]: peptic stricture s/p balloon dilatation. abnormal esophageal mucosa, by c/w barrett's without dysplasia, previous PUD completely healed.   Marland Kitchen POLYPECTOMY  04/07/2019   Procedure: POLYPECTOMY;  Surgeon: [name], MD;  [facility];  Service: Endoscopy;;  colon    There were no vitals filed for this visit.      [facility] PT Assessment - 08/04/19 0001      Assessment   Medical Diagnosis  difficulty walking     Referring Provider (PT)  Tesfaye Fanta     Onset Date/Surgical Date  05/15/18    Hand Dominance  Right    Next MD Visit  not scheduled     Prior Therapy  years ago only in the hospital       Precautions   Precautions  Fall  Restrictions   Weight Bearing Restrictions  No      Home Environment   Living Environment  Private residence    Type of [place] Access  Level entry      Prior Function   Level of Independence  Independent with household mobility with device      Cognition   Overall Cognitive Status  Within Functional Limits for tasks assessed      Functional Tests   Functional tests  Single leg stance;Sit to Stand      Single Leg Stance   Comments  Rt: 3 " was 0; LT 7" was 1 " pt needs encouragement.      Sit to Stand   Comments  5 x in 15.55 was  19.43       Strength   Right Hip Flexion  5/5    Right Hip Extension  2/5   was 2/5   Right Hip ABduction   3+/5   was 3+    Left Hip Flexion  5/5    Left Hip Extension  4/5    Left Hip ABduction  5/5    Right Knee Flexion  3+/5    Right Knee Extension  4/5   was 3+   Left Knee Flexion  5/5   was 3/5   Left Knee Extension  5/5    Right Ankle Dorsiflexion  4/5   was 4/5   Left Ankle Dorsiflexion  5/5      Ambulation/Gait   Ambulation/Gait  Yes    Ambulation/Gait Assistance  6: Modified independent (Device/Increase time)    Ambulation Distance (Feet)  286 Feet    Assistive device  Rolling walker    Gait Pattern  Right foot flat    Gait Comments  3 minute walk                         Balance Exercises - 08/04/19 1428      Balance Exercises: Standing   Tandem Stance  Eyes open;5 reps   with head turns    SLS  Eyes open;3 reps   Rt 3" Lt 5 "   Sit to Stand Time  15          PT Short Term Goals - 08/04/19 1416      PT SHORT TERM GOAL #1   Title  PT to be I in a HEP to increase core and Rt LE strength to allow pt to be able to go up 3-5 steps with one hand assist    Baseline  able to perform on 5 inch steps, unable to perform on standard steps    Time  3    Period  Weeks    Status  Achieved    Target Date  07/07/19      PT SHORT TERM GOAL #2   Title  Pt to be able to single leg stance on both LE for at least 10 seconds to decrease risk of falling    Baseline  Rt: 0, Lt 6 seconds    Time  3    Period  Weeks    Status  On-going        PT Long Term Goals - 08/04/19 1416      PT LONG TERM GOAL #1   Title  PT LE stength to have increased by one grade to allow pt to ambulate greater than 320 ft in three minutes using  appropriate assistive device.    Baseline  9/17 298 ft with RWl 10/21  286 ft wutg cane    Time  6    Period  Weeks    Status  Achieved      PT LONG TERM GOAL #2   Title  PT to be able to single leg stance for at least 15 seconds on both LE to allow pt to feel confident walking in his home with a single point cane.    Time  6    Period   Weeks    Status  On-going      PT LONG TERM GOAL #3   Title  PT core and LE strength to be improved to allow pt to be able to get in and out of lower chairs/ cars without difficulty.    Time  6    Period  Weeks    Status  Partially Met   strength has not increased but pt finds getting in and out of chairs easier.     PT LONG TERM GOAL #4   Title  PT to be able to go up and down  8-10(one flight) of steps with a cane and one hand hold.    Time  6    Period  Weeks    Status  Achieved            Plan - 08/04/19 1434    Clinical Impression Statement  PT ambulating I with a single point cane.  Pt reassessed, his balance has improved somewhat but not to the goal and strength generally remains the same.  Pt admits to not being consistant in his HEP.  Pt states that he is happy with his functional level and does not desire to come for anymore physical therapy visits, he will continue with OT>    Personal Factors and Comorbidities  Time since onset of injury/illness/exacerbation;Transportation;Social Background    Examination-Activity Limitations  Bathing;Bed Mobility;Carry;Dressing;Stairs;Squat;Locomotion Level;Lift    Examination-Participation Restrictions  Cleaning;Community Activity;Laundry;Yard Work    Stability/Clinical Decision Making  Stable/Uncomplicated    Rehab Potential  Good    PT Frequency  2x / week    PT Duration  8 weeks    PT Treatment/Interventions  ADLs/Self Care Home Management;Gait training;Therapeutic exercise;Therapeutic activities;Manual techniques;Functional mobility training;Balance training    PT Next Visit Plan  Discharge to HEP    PT Home Exercise Plan  LAQ, Sidelying hip abduction, prone knee flexion, prone hip extension, standing sit to stand and heel raises.       Patient will benefit from skilled therapeutic intervention in order to improve the following deficits and impairments:  Abnormal gait, Decreased activity tolerance, Decreased balance, Decreased  strength, Difficulty walking, Decreased mobility  Visit Diagnosis: Other lack of coordination - Plan: PT plan of care cert/re-cert  Muscle weakness (generalized) - Plan: PT plan of care cert/re-cert  Difficulty in walking, not elsewhere classified - Plan: PT plan of care cert/re-cert     Problem List Patient Active Problem List   Diagnosis Date Noted  . Barrett's esophagus 06/15/2019  . Peptic stricture of esophagus 03/30/2019  . Anemia 03/30/2019  . Abnormality of esophagus   . Gastritis and gastroduodenitis   . Gastroesophageal reflux disease with esophagitis   . Lobar pneumonia ([place]) 08/22/2018  . Sepsis due to undetermined organism ([place]) 08/22/2018  . Fever   . Precordial pain   . HCAP (healthcare-associated pneumonia) 08/21/2018  . Elevated transaminase level 08/21/2018  . Atrial tachycardia ([place]) 07/13/2018  .  Dyslipidemia 07/13/2018  . Acute ischemic stroke ([place]) 07/12/2018  . Essential hypertension 07/12/2018  . Tobacco abuse 07/12/2018  . Cocaine abuse ([place]) 07/12/2018  . Ischemic stroke [facility])    Rayetta Humphrey, PT CLT [phone] 08/04/2019, 2:41 PM  [place] [address] [place], [place], [address] Phone: [phone]   Fax:  [phone]  Name: Evan Chavez MRN: [id number] Date of Birth: 1961-09-09

## 2019-08-05 ENCOUNTER — Other Ambulatory Visit: Payer: Self-pay

## 2019-08-05 ENCOUNTER — Ambulatory Visit (HOSPITAL_COMMUNITY): Payer: Medicaid Other

## 2019-08-05 ENCOUNTER — Encounter (HOSPITAL_COMMUNITY): Payer: Self-pay

## 2019-08-05 DIAGNOSIS — R29898 Other symptoms and signs involving the musculoskeletal system: Secondary | ICD-10-CM

## 2019-08-05 DIAGNOSIS — R278 Other lack of coordination: Secondary | ICD-10-CM | POA: Diagnosis not present

## 2019-08-05 DIAGNOSIS — M25611 Stiffness of right shoulder, not elsewhere classified: Secondary | ICD-10-CM

## 2019-08-05 NOTE — Therapy (Signed)
[place], [place], [address] Phone: [phone]   Fax:  [phone]  Occupational Therapy Treatment  Patient Details  Name: Evan Chavez MRN: [id number] Date of Birth: 10/27/1960 Referring Provider (OT): Dr. Rosita Fire   Encounter Date: 08/05/2019  OT End of Session - 08/05/19 1610    Visit Number  7    Number of Visits  10    Date for OT Re-Evaluation  08/23/19    Authorization Type  Medicaid    Authorization Time Period  8 visits approved (07/27/19-08/23/19)    Authorization - Visit Number  3    Authorization - Number of Visits  8    OT Start Time  [id number]    OT Stop Time  1509    OT Time Calculation (min)  38 min    Activity Tolerance  Patient tolerated treatment well    Behavior During Therapy  [facility] for tasks assessed/performed       Past Medical History:  Diagnosis Date  . Cocaine abuse ([place])   . ETOH abuse   . GERD (gastroesophageal reflux disease)   . Hyperlipidemia   . Stroke (cerebrum) ([place]) 06/2018   right sided weakness    Past Surgical History:  Procedure Laterality Date  . BIOPSY  08/25/2018   Procedure: BIOPSY;  Surgeon: [name], MD;  [facility];  Service: Endoscopy;;  . BIOPSY  12/13/2018   Procedure: BIOPSY;  Surgeon: [name], MD;  [facility];  Service: Endoscopy;;  esophagus  . COLONOSCOPY WITH PROPOFOL N/A 04/07/2019   Dr. [name]: 3 Tubular adenomas removed (4 to 8 mm in size), next colonoscopy 3 years.  . ESOPHAGOGASTRODUODENOSCOPY (EGD) WITH PROPOFOL N/A 08/25/2018   Dr. [name]: Severely inflamed abnormal appearing mid/distal esophagus.  Encroachment on lumen suspicious for infiltrating neoplasm located be all from severe benign inflammation, extensive gastric erosions and extensive duodenal ulcerations.  Query ischemic process versus other.  Biopsy revealed Barrett's esophagus but negative for malignancy and H. pylori.  .  ESOPHAGOGASTRODUODENOSCOPY (EGD) WITH PROPOFOL N/A 12/13/2018   Dr. [name]: peptic stricture s/p balloon dilatation. abnormal esophageal mucosa, by c/w barrett's without dysplasia, previous PUD completely healed.   Marland Kitchen POLYPECTOMY  04/07/2019   Procedure: POLYPECTOMY;  Surgeon: [name], MD;  [facility];  Service: Endoscopy;;  colon    There were no vitals filed for this visit.  Subjective Assessment - 08/05/19 1446    Subjective   S: Nothing new to report.    Currently in Pain?  No/denies         [facility] OT Assessment - 08/05/19 1446      Assessment   Medical Diagnosis  right side weakness      Precautions   Precautions  Fall               OT Treatments/Exercises (OP) - 08/05/19 1447      Exercises   Exercises  Shoulder      Shoulder Exercises: Supine   Protraction  PROM;5 reps;Strengthening;12 reps    Protraction Weight (lbs)  3# dowel    Horizontal ABduction  PROM;5 reps;Strengthening;12 reps    Horizontal ABduction Weight (lbs)  3# dowel    External Rotation  PROM;5 reps;Strengthening;12 reps    External Rotation Weight (lbs)  3# dowel    Internal Rotation  PROM;5 reps;Strengthening;12 reps    Internal Rotation Weight (lbs)  3# dowel    Flexion  PROM;5 reps;Strengthening;12 reps    Shoulder Flexion Weight (lbs)  3# dowel    ABduction  PROM;5 reps;Strengthening;12 reps    Shoulder ABduction Weight (lbs)  3# dowel      Shoulder Exercises: Standing   Protraction  AROM;10 reps    Horizontal ABduction  AROM;10 reps    External Rotation  AROM;10 reps    Internal Rotation  AROM;10 reps    Flexion  AROM;10 reps    ABduction  AROM;10 reps      Shoulder Exercises: ROM/Strengthening   Wall Wash  1'    Over Head Lace  seated 2'     Other ROM/Strengthening Exercises  PVC Pipe slide; 12X      Functional Reaching Activities   Mid Level  Functional reaching completed utilizing resistice clothespins while placing pins to reach 34.5 cm high.                 OT Short Term Goals - 07/27/19 1608      OT SHORT TERM GOAL #1   Title  Patient will be educated and independent with HEP with reports of completing 1-2 times a day in order to faciliate progress in therapy and allow patient increased use of his RUE during daily tasks.    Time  2    Period  Weeks    Target Date  07/20/19        OT Long Term Goals - 08/03/19 1320      OT LONG TERM GOAL #1   Title  Patient will increase his right shoulder Passive ROM to [facility] in order to complete reaching tasks at shoulder level with less difficulty.    Time  4    Period  Weeks    Status  On-going      OT LONG TERM GOAL #2   Title  Patient will increase his right grip strength by 15# and his pinch strength by 6# in order to be able to hold onto items without dropping.    Time  4    Period  Weeks    Status  On-going      OT LONG TERM GOAL #3   Title  Patient will increase his right shoulder strength to 4/5 overall in order to be able to lift and handle normal household items with less difficulty.    Time  4    Period  Weeks    Status  On-going      OT LONG TERM GOAL #4   Title  Patient will increase his right hand coordination by completing the 9 hole peg test in 40 seconds or less in order to return to using his right hand as his dominant extremity.    Time  4    Period  Weeks    Status  On-going      OT LONG TERM GOAL #5   Title  Pt will increase A/ROM to [facility] to improve ability to perform functional reaching tasks above shoulder level during ADLs.    Time  4    Period  Weeks    Status  On-going            Plan - 08/05/19 1610    Clinical Impression Statement  A: Focused on RUE shoulder while completing passive stretching, functional reaching , and strengthening. patient demonstrated improved functional ROM during all exercises. VC were provided for form and technique while encouraging patient to keep his elbow extended when passively stretched and reaching to  increase  ROM.    Body Structure / Function / Physical Skills  ADL;UE functional use;FMC;ROM;GMC;Coordination;IADL;Strength;Dexterity    Plan  P: Continue with functional reaching activities for the shoulder while placing cones in cabinet. Update HEP if needed.    Consulted and Agree with Plan of Care  Patient       Patient will benefit from skilled therapeutic intervention in order to improve the following deficits and impairments:   Body Structure / Function / Physical Skills: ADL, UE functional use, FMC, ROM, GMC, Coordination, IADL, Strength, Dexterity       Visit Diagnosis: Other lack of coordination  Other symptoms and signs involving the musculoskeletal system  Stiffness of right shoulder, not elsewhere classified    Problem List Patient Active Problem List   Diagnosis Date Noted  . Barrett's esophagus 06/15/2019  . Peptic stricture of esophagus 03/30/2019  . Anemia 03/30/2019  . Abnormality of esophagus   . Gastritis and gastroduodenitis   . Gastroesophageal reflux disease with esophagitis   . Lobar pneumonia ([place]) 08/22/2018  . Sepsis due to undetermined organism ([place]) 08/22/2018  . Fever   . Precordial pain   . HCAP (healthcare-associated pneumonia) 08/21/2018  . Elevated transaminase level 08/21/2018  . Atrial tachycardia ([place]) 07/13/2018  . Dyslipidemia 07/13/2018  . Acute ischemic stroke ([place]) 07/12/2018  . Essential hypertension 07/12/2018  . Tobacco abuse 07/12/2018  . Cocaine abuse ([place]) 07/12/2018  . Ischemic stroke [facility])    Ailene Ravel, OTR/L,CBIS  [phone]  08/05/2019, 4:14 PM  [place], [place], [address] Phone: [phone]   Fax:  [phone]  Name: Mendy Klapper MRN: [id number] Date of Birth: Dec 11, 1960

## 2019-08-17 ENCOUNTER — Ambulatory Visit (HOSPITAL_COMMUNITY): Payer: Medicaid Other | Attending: Internal Medicine | Admitting: Occupational Therapy

## 2019-08-17 ENCOUNTER — Encounter (HOSPITAL_COMMUNITY): Payer: Self-pay | Admitting: Occupational Therapy

## 2019-08-17 ENCOUNTER — Other Ambulatory Visit: Payer: Self-pay

## 2019-08-17 DIAGNOSIS — M25611 Stiffness of right shoulder, not elsewhere classified: Secondary | ICD-10-CM | POA: Insufficient documentation

## 2019-08-17 DIAGNOSIS — R29898 Other symptoms and signs involving the musculoskeletal system: Secondary | ICD-10-CM

## 2019-08-17 DIAGNOSIS — R278 Other lack of coordination: Secondary | ICD-10-CM | POA: Diagnosis present

## 2019-08-17 NOTE — Therapy (Signed)
[place], [place], [address] Phone: [phone]   Fax:  [phone]  Occupational Therapy Treatment  Patient Details  Name: Evan Chavez MRN: [id number] Date of Birth: 05-10-1961 Referring Provider (OT): Dr. Rosita Fire   Encounter Date: 08/17/2019  OT End of Session - 08/17/19 1639    Visit Number  8    Number of Visits  10    Date for OT Re-Evaluation  08/23/19    Authorization Type  Medicaid    Authorization Time Period  8 visits approved (07/27/19-08/23/19)    Authorization - Visit Number  4    Authorization - Number of Visits  8    OT Start Time  [id number]    OT Stop Time  1636    OT Time Calculation (min)  39 min    Activity Tolerance  Patient tolerated treatment well    Behavior During Therapy  [facility] for tasks assessed/performed       Past Medical History:  Diagnosis Date  . Cocaine abuse ([place])   . ETOH abuse   . GERD (gastroesophageal reflux disease)   . Hyperlipidemia   . Stroke (cerebrum) ([place]) 06/2018   right sided weakness    Past Surgical History:  Procedure Laterality Date  . BIOPSY  08/25/2018   Procedure: BIOPSY;  Surgeon: [name], MD;  [facility];  Service: Endoscopy;;  . BIOPSY  12/13/2018   Procedure: BIOPSY;  Surgeon: [name], MD;  [facility];  Service: Endoscopy;;  esophagus  . COLONOSCOPY WITH PROPOFOL N/A 04/07/2019   Dr. [name]: 3 Tubular adenomas removed (4 to 8 mm in size), next colonoscopy 3 years.  . ESOPHAGOGASTRODUODENOSCOPY (EGD) WITH PROPOFOL N/A 08/25/2018   Dr. [name]: Severely inflamed abnormal appearing mid/distal esophagus.  Encroachment on lumen suspicious for infiltrating neoplasm located be all from severe benign inflammation, extensive gastric erosions and extensive duodenal ulcerations.  Query ischemic process versus other.  Biopsy revealed Barrett's esophagus but negative for malignancy and H. pylori.  .  ESOPHAGOGASTRODUODENOSCOPY (EGD) WITH PROPOFOL N/A 12/13/2018   Dr. [name]: peptic stricture s/p balloon dilatation. abnormal esophageal mucosa, by c/w barrett's without dysplasia, previous PUD completely healed.   Mar[place] Kitchen POLYPECTOMY  04/07/2019   Procedure: POLYPECTOMY;  Surgeon: [name], MD;  [facility];  Service: Endoscopy;;  colon    There were no vitals filed for this visit.  Subjective Assessment - 08/17/19 1558    Subjective   S: It's feeling pretty good.    Currently in Pain?  No/denies         [facility] OT Assessment - 08/17/19 1558      Assessment   Medical Diagnosis  right side weakness      Precautions   Precautions  Fall               OT Treatments/Exercises (OP) - 08/17/19 1558      Exercises   Exercises  Shoulder      Shoulder Exercises: Supine   Protraction  PROM;5 reps;Strengthening;12 reps    Protraction Weight (lbs)  3# dowel    Horizontal ABduction  PROM;5 reps;Strengthening;12 reps    Horizontal ABduction Weight (lbs)  3# dowel    External Rotation  PROM;5 reps;Strengthening;12 reps    External Rotation Weight (lbs)  3# dowel    Internal Rotation  PROM;5 reps;Strengthening;12 reps    Internal Rotation Weight (lbs)  3# dowel    Flexion  PROM;5 reps;Strengthening;12 reps    Shoulder Flexion Weight (lbs)  3# dowel    ABduction  PROM;5 reps;Strengthening;12 reps    Shoulder ABduction Weight (lbs)  3# dowel      Shoulder Exercises: Standing   Protraction  AROM;10 reps    Horizontal ABduction  AROM;10 reps    External Rotation  AROM;10 reps    Internal Rotation  AROM;10 reps    Flexion  AROM;10 reps    ABduction  AROM;10 reps      Shoulder Exercises: Therapy Ball   Other Therapy Ball Exercises  green therapy ball: chest press, flexion, overhead press, circles each direction, 10X each      Shoulder Exercises: ROM/Strengthening   UBE (Upper Arm Bike)  Level 1 3' forward    Over Head Lace  seated 2'     X to V Arms  10X A/ROM       Functional Reaching Activities   Mid Level  Pt placed and removed 10 cones from top shelf of cabinet. Min difficulty                OT Short Term Goals - 07/27/19 1608      OT SHORT TERM GOAL #1   Title  Patient will be educated and independent with HEP with reports of completing 1-2 times a day in order to faciliate progress in therapy and allow patient increased use of his RUE during daily tasks.    Time  2    Period  Weeks    Target Date  07/20/19        OT Long Term Goals - 08/03/19 1320      OT LONG TERM GOAL #1   Title  Patient will increase his right shoulder Passive ROM to [facility] in order to complete reaching tasks at shoulder level with less difficulty.    Time  4    Period  Weeks    Status  On-going      OT LONG TERM GOAL #2   Title  Patient will increase his right grip strength by 15# and his pinch strength by 6# in order to be able to hold onto items without dropping.    Time  4    Period  Weeks    Status  On-going      OT LONG TERM GOAL #3   Title  Patient will increase his right shoulder strength to 4/5 overall in order to be able to lift and handle normal household items with less difficulty.    Time  4    Period  Weeks    Status  On-going      OT LONG TERM GOAL #4   Title  Patient will increase his right hand coordination by completing the 9 hole peg test in 40 seconds or less in order to return to using his right hand as his dominant extremity.    Time  4    Period  Weeks    Status  On-going      OT LONG TERM GOAL #5   Title  Pt will increase A/ROM to [facility] to improve ability to perform functional reaching tasks above shoulder level during ADLs.    Time  4    Period  Weeks    Status  On-going            Plan - 08/17/19 1611    Clinical Impression Statement  A: Continued with focus on RUE shoulder functioning including passive stretching, strengthening, and  functional reaching tasks. Pt with max difficulty relaxing RUE to allow for maximum  passive stretch. Added functional reaching task using cones today, continued with overhead lacing and added UBE. Verbal cuing for form and keeping elbow extended during tasks.    Body Structure / Function / Physical Skills  ADL;UE functional use;FMC;ROM;GMC;Coordination;IADL;Strength;Dexterity    Plan  P: Continue with shoulder strengthening and functional task completion       Patient will benefit from skilled therapeutic intervention in order to improve the following deficits and impairments:   Body Structure / Function / Physical Skills: ADL, UE functional use, FMC, ROM, GMC, Coordination, IADL, Strength, Dexterity       Visit Diagnosis: Other symptoms and signs involving the musculoskeletal system  Stiffness of right shoulder, not elsewhere classified    Problem List Patient Active Problem List   Diagnosis Date Noted  . Barrett's esophagus 06/15/2019  . Peptic stricture of esophagus 03/30/2019  . Anemia 03/30/2019  . Abnormality of esophagus   . Gastritis and gastroduodenitis   . Gastroesophageal reflux disease with esophagitis   . Lobar pneumonia ([place]) 08/22/2018  . Sepsis due to undetermined organism ([place]) 08/22/2018  . Fever   . Precordial pain   . HCAP (healthcare-associated pneumonia) 08/21/2018  . Elevated transaminase level 08/21/2018  . Atrial tachycardia ([place]) 07/13/2018  . Dyslipidemia 07/13/2018  . Acute ischemic stroke ([place]) 07/12/2018  . Essential hypertension 07/12/2018  . Tobacco abuse 07/12/2018  . Cocaine abuse ([place]) 07/12/2018  . Ischemic stroke [facility])    [name], OTR/L  [phone] 08/17/2019, 4:41 PM  [place] [address] [place], [place], [address] Phone: [phone]   Fax:  [phone]  Name: Evan Chavez MRN: [id number] Date of Birth: 1961-09-09

## 2019-08-19 ENCOUNTER — Ambulatory Visit (HOSPITAL_COMMUNITY): Payer: Medicaid Other

## 2019-08-19 ENCOUNTER — Other Ambulatory Visit: Payer: Self-pay

## 2019-08-19 ENCOUNTER — Encounter (HOSPITAL_COMMUNITY): Payer: Self-pay

## 2019-08-19 DIAGNOSIS — M25611 Stiffness of right shoulder, not elsewhere classified: Secondary | ICD-10-CM

## 2019-08-19 DIAGNOSIS — R29898 Other symptoms and signs involving the musculoskeletal system: Secondary | ICD-10-CM | POA: Diagnosis not present

## 2019-08-19 DIAGNOSIS — R278 Other lack of coordination: Secondary | ICD-10-CM

## 2019-08-19 NOTE — Therapy (Signed)
[place], [place], [address] Phone: [phone]   Fax:  [phone]  Occupational Therapy Treatment  Patient Details  Name: Evan Chavez MRN: [id number] Date of Birth: 07/23/1961 Referring Provider (OT): Dr. Rosita Fire   Encounter Date: 08/19/2019  OT End of Session - 08/19/19 1144    Visit Number  9    Number of Visits  10    Date for OT Re-Evaluation  08/23/19    Authorization Type  Medicaid    Authorization Time Period  8 visits approved (07/27/19-08/23/19)    Authorization - Visit Number  5    Authorization - Number of Visits  8    OT Start Time  [id number]    OT Stop Time  1114    OT Time Calculation (min)  41 min    Activity Tolerance  Patient tolerated treatment well    Behavior During Therapy  [facility] for tasks assessed/performed       Past Medical History:  Diagnosis Date  . Cocaine abuse ([place])   . ETOH abuse   . GERD (gastroesophageal reflux disease)   . Hyperlipidemia   . Stroke (cerebrum) ([place]) 06/2018   right sided weakness    Past Surgical History:  Procedure Laterality Date  . BIOPSY  08/25/2018   Procedure: BIOPSY;  Surgeon: [name], MD;  [facility];  Service: Endoscopy;;  . BIOPSY  12/13/2018   Procedure: BIOPSY;  Surgeon: [name], MD;  [facility];  Service: Endoscopy;;  esophagus  . COLONOSCOPY WITH PROPOFOL N/A 04/07/2019   Dr. [name]: 3 Tubular adenomas removed (4 to 8 mm in size), next colonoscopy 3 years.  . ESOPHAGOGASTRODUODENOSCOPY (EGD) WITH PROPOFOL N/A 08/25/2018   Dr. [name]: Severely inflamed abnormal appearing mid/distal esophagus.  Encroachment on lumen suspicious for infiltrating neoplasm located be all from severe benign inflammation, extensive gastric erosions and extensive duodenal ulcerations.  Query ischemic process versus other.  Biopsy revealed Barrett's esophagus but negative for malignancy and H. pylori.  .  ESOPHAGOGASTRODUODENOSCOPY (EGD) WITH PROPOFOL N/A 12/13/2018   Dr. [name]: peptic stricture s/p balloon dilatation. abnormal esophageal mucosa, by c/w barrett's without dysplasia, previous PUD completely healed.   Marland Kitchen POLYPECTOMY  04/07/2019   Procedure: POLYPECTOMY;  Surgeon: [name], MD;  [facility];  Service: Endoscopy;;  colon    There were no vitals filed for this visit.  Subjective Assessment - 08/19/19 1039    Subjective   S: No pain    Currently in Pain?  No/denies         [facility] OT Assessment - 08/19/19 1040      Assessment   Medical Diagnosis  right side weakness      Precautions   Precautions  Fall               OT Treatments/Exercises (OP) - 08/19/19 1040      Exercises   Exercises  Hand      Hand Exercises   Hand Gripper with Large Beads  all beads with gripper set at 37#   horizontal   Hand Gripper with Medium Beads  all beads with gripper set at 37#   horizontal   Hand Gripper with Small Beads  all beads with gripper set at 35#   horizontal   Other Hand Exercises  Pt utilized green resistive clothespin to pick up 30 sponges and place on elevated surface while working on pinch strength and  functional reaching ability. Patient picked up remaining 6 sponges using red clothespin.       Fine Motor Coordination (Hand/Wrist)   Fine Motor Coordination  Small Pegboard    Small Pegboard  Utilizing tweezers, patient completed colored pegboard pattern to increase fine motor coordination.                OT Short Term Goals - 07/27/19 1608      OT SHORT TERM GOAL #1   Title  Patient will be educated and independent with HEP with reports of completing 1-2 times a day in order to faciliate progress in therapy and allow patient increased use of his RUE during daily tasks.    Time  2    Period  Weeks    Target Date  07/20/19        OT Long Term Goals - 08/03/19 1320      OT LONG TERM GOAL #1   Title  Patient will increase his  right shoulder Passive ROM to [facility] in order to complete reaching tasks at shoulder level with less difficulty.    Time  4    Period  Weeks    Status  On-going      OT LONG TERM GOAL #2   Title  Patient will increase his right grip strength by 15# and his pinch strength by 6# in order to be able to hold onto items without dropping.    Time  4    Period  Weeks    Status  On-going      OT LONG TERM GOAL #3   Title  Patient will increase his right shoulder strength to 4/5 overall in order to be able to lift and handle normal household items with less difficulty.    Time  4    Period  Weeks    Status  On-going      OT LONG TERM GOAL #4   Title  Patient will increase his right hand coordination by completing the 9 hole peg test in 40 seconds or less in order to return to using his right hand as his dominant extremity.    Time  4    Period  Weeks    Status  On-going      OT LONG TERM GOAL #5   Title  Pt will increase A/ROM to [facility] to improve ability to perform functional reaching tasks above shoulder level during ADLs.    Time  4    Period  Weeks    Status  On-going            Plan - 08/19/19 1144    Clinical Impression Statement  A: Session focused on hand strength and coordination. Patient was able to increase the resistance used with the handgripper. Demonstrates little to no movement of right wrist when completing tweezer tast even with VC to move his wrist in order to grasp the pegs.    Body Structure / Function / Physical Skills  ADL;UE functional use;FMC;ROM;GMC;Coordination;IADL;Strength;Dexterity    Plan  P: Reassess and determine if more medicaid visits are needed. If so, request additional visits.    Consulted and Agree with Plan of Care  Patient       Patient will benefit from skilled therapeutic intervention in order to improve the following deficits and impairments:   Body Structure / Function / Physical Skills: ADL, UE functional use, FMC, ROM, GMC, Coordination,  IADL, Strength, Dexterity       Visit Diagnosis: Stiffness  of right shoulder, not elsewhere classified  Other symptoms and signs involving the musculoskeletal system  Other lack of coordination    Problem List Patient Active Problem List   Diagnosis Date Noted  . Barrett's esophagus 06/15/2019  . Peptic stricture of esophagus 03/30/2019  . Anemia 03/30/2019  . Abnormality of esophagus   . Gastritis and gastroduodenitis   . Gastroesophageal reflux disease with esophagitis   . Lobar pneumonia ([place]) 08/22/2018  . Sepsis due to undetermined organism ([place]) 08/22/2018  . Fever   . Precordial pain   . HCAP (healthcare-associated pneumonia) 08/21/2018  . Elevated transaminase level 08/21/2018  . Atrial tachycardia ([place]) 07/13/2018  . Dyslipidemia 07/13/2018  . Acute ischemic stroke ([place]) 07/12/2018  . Essential hypertension 07/12/2018  . Tobacco abuse 07/12/2018  . Cocaine abuse ([place]) 07/12/2018  . Ischemic stroke [facility])    Ailene Ravel, OTR/L,CBIS  [phone]  08/19/2019, 11:47 AM  [place] [address] [place], [place], [address] Phone: [phone]   Fax:  [phone]  Name: Evan Chavez MRN: [id number] Date of Birth: [birth year]

## 2019-08-23 ENCOUNTER — Other Ambulatory Visit: Payer: Self-pay

## 2019-08-23 ENCOUNTER — Ambulatory Visit (HOSPITAL_COMMUNITY): Payer: Medicaid Other

## 2019-08-23 ENCOUNTER — Encounter (HOSPITAL_COMMUNITY): Payer: Self-pay

## 2019-08-23 DIAGNOSIS — R29898 Other symptoms and signs involving the musculoskeletal system: Secondary | ICD-10-CM | POA: Diagnosis not present

## 2019-08-23 DIAGNOSIS — R278 Other lack of coordination: Secondary | ICD-10-CM

## 2019-08-23 DIAGNOSIS — M25611 Stiffness of right shoulder, not elsewhere classified: Secondary | ICD-10-CM

## 2019-08-23 NOTE — Therapy (Signed)
[place], [place], [address] Phone: [phone]   Fax:  [phone]  Occupational Therapy Treatment  Patient Details  Name: Evan Chavez MRN: [id number] Date of Birth: 16-Sep-1961 Referring Provider (OT): Dr. Rosita Fire   Encounter Date: 08/23/2019  OT End of Session - 08/23/19 1232    Visit Number  10    Number of Visits  10    Authorization Type  Medicaid    Authorization Time Period  8 visits approved (07/27/19-08/23/19)    Authorization - Visit Number  6    Authorization - Number of Visits  8    OT Start Time  [phone]   pt arrived late. Reassessment   OT Stop Time  1023    OT Time Calculation (min)  35 min    Activity Tolerance  Patient tolerated treatment well    Behavior During Therapy  WFL for tasks assessed/performed       Past Medical History:  Diagnosis Date  . Cocaine abuse ([place])   . ETOH abuse   . GERD (gastroesophageal reflux disease)   . Hyperlipidemia   . Stroke (cerebrum) ([place]) 06/2018   right sided weakness    Past Surgical History:  Procedure Laterality Date  . BIOPSY  08/25/2018   Procedure: BIOPSY;  Surgeon: [name], MD;  [facility];  Service: Endoscopy;;  . BIOPSY  12/13/2018   Procedure: BIOPSY;  Surgeon: [name], MD;  Location: [facility];  Service: Endoscopy;;  esophagus  . COLONOSCOPY WITH PROPOFOL N/A 04/07/2019   Dr. [name]: 3 Tubular adenomas removed (4 to 8 mm in size), next colonoscopy 3 years.  . ESOPHAGOGASTRODUODENOSCOPY (EGD) WITH PROPOFOL N/A 08/25/2018   Dr. [name]: Severely inflamed abnormal appearing mid/distal esophagus.  Encroachment on lumen suspicious for infiltrating neoplasm located be all from severe benign inflammation, extensive gastric erosions and extensive duodenal ulcerations.  Query ischemic process versus other.  Biopsy revealed Barrett's esophagus but negative for malignancy and H. pylori.  .  ESOPHAGOGASTRODUODENOSCOPY (EGD) WITH PROPOFOL N/A 12/13/2018   Dr. [name]: peptic stricture s/p balloon dilatation. abnormal esophageal mucosa, by c/w barrett's without dysplasia, previous PUD completely healed.   Marland Kitchen POLYPECTOMY  04/07/2019   Procedure: POLYPECTOMY;  Surgeon: [name], MD;  Location[facility];  Service: Endoscopy;;  colon    There were no vitals filed for this visit.  Subjective Assessment - 08/23/19 1232    Subjective   S: I can tell my arm is a lot better.    Currently in Pain?  No/denies         [facility] OT Assessment - 08/23/19 0951      Assessment   Medical Diagnosis  right side weakness      Precautions   Precautions  Fall      Coordination   Right 9 Hole Peg Test  54.5"   previous: 55.8"     ROM / Strength   AROM / PROM / Strength  AROM;Strength      AROM   Overall AROM Comments  Assessed seated. IR/er adducted    AROM Assessment Site  Shoulder    Right/Left Shoulder  Right    Right Shoulder Flexion  125 Degrees   previous: 95   Right Shoulder ABduction  121 Degrees   previous: 72   Right Shoulder Internal Rotation  90 Degrees   previous: same   Right Shoulder External Rotation  50 Degrees   previous:  32     PROM   Overall PROM Comments  Assessed seated, er/IR adducted. Previously assessed supine.    PROM Assessment Site  Shoulder    Right/Left Shoulder  Right    Right Shoulder Flexion  140 Degrees   Previous: 138   Right Shoulder ABduction  145 Degrees   previous: 105   Right Shoulder Internal Rotation  90 Degrees   previous: same   Right Shoulder External Rotation  71 Degrees   previous: 46     Strength   Overall Strength Comments  Measured seated. IR/er adducted.     Strength Assessment Site  Shoulder;Hand    Right/Left Shoulder  Right    Right Shoulder Flexion  4+/5   previous: 4/5   Right Shoulder ABduction  4-/5   previous: 3+/5   Right Shoulder Internal Rotation  5/5   previous: same   Right Shoulder External  Rotation  5/5   previous: 4-/5   Right/Left hand  Right    Right Hand Grip (lbs)  50   previous: 50   Right Hand Lateral Pinch  12 lbs   previous: 9   Right Hand 3 Point Pinch  12 lbs   previous: 9                      OT Education - 08/23/19 1229    Education Details  Reviewed therapy and progress in therapy. Updated HEP to include only red theraputty for grip and pinch strengthening. Added coordination activities also.    Person(s) Educated  Patient    Methods  Explanation;Demonstration;Handout;Verbal cues    Comprehension  Verbalized understanding;Returned demonstration       OT Short Term Goals - 07/27/19 1608      OT SHORT TERM GOAL #1   Title  Patient will be educated and independent with HEP with reports of completing 1-2 times a day in order to faciliate progress in therapy and allow patient increased use of his RUE during daily tasks.    Time  2    Period  Weeks    Target Date  07/20/19        OT Long Term Goals - 08/23/19 1007      OT LONG TERM GOAL #1   Title  Patient will increase his right shoulder Passive ROM to [facility] in order to complete reaching tasks at shoulder level with less difficulty.    Time  4    Period  Weeks    Status  Achieved      OT LONG TERM GOAL #2   Title  Patient will increase his right grip strength by 15# and his pinch strength by 6# in order to be able to hold onto items without dropping.    Time  4    Period  Weeks    Status  Partially Met      OT LONG TERM GOAL #3   Title  Patient will increase his right shoulder strength to 4/5 overall in order to be able to lift and handle normal household items with less difficulty.    Time  4    Period  Weeks    Status  Achieved      OT LONG TERM GOAL #4   Title  Patient will increase his right hand coordination by completing the 9 hole peg test in 40 seconds or less in order to return to using his right hand as his dominant extremity.    Time  4    Period  Weeks    Status   Not Met      OT LONG TERM GOAL #5   Title  Pt will increase A/ROM to [facility] to improve ability to perform functional reaching tasks above shoulder level during ADLs.    Time  4    Period  Weeks    Status  Achieved            Plan - 08/23/19 1233    Clinical Impression Statement  A: Reassessment and discharge completed this session. Patient has met 3/5 long term goals completely with an additional one partially met and another not met. Patient has made improvements with his RUE ROM and strength overall. grip strength has increased by 5# and his pinch strength has made slight improvement as well. Slower progress is noted with coordination when tested. patient continues to demonstrate decreased motor speed during coordination tasks and decreased hand strength for grip and pinch activities. HEP was updated and reviewed. Patient is in agreement with discharge.    Body Structure / Function / Physical Skills  ADL;UE functional use;FMC;ROM;GMC;Coordination;IADL;Strength;Dexterity    Plan  P: D/C from OT services with HEP.    Consulted and Agree with Plan of Care  Patient       Patient will benefit from skilled therapeutic intervention in order to improve the following deficits and impairments:   Body Structure / Function / Physical Skills: ADL, UE functional use, FMC, ROM, GMC, Coordination, IADL, Strength, Dexterity       Visit Diagnosis: Stiffness of right shoulder, not elsewhere classified  Other symptoms and signs involving the musculoskeletal system  Other lack of coordination    Problem List Patient Active Problem List   Diagnosis Date Noted  . Barrett's esophagus 06/15/2019  . Peptic stricture of esophagus 03/30/2019  . Anemia 03/30/2019  . Abnormality of esophagus   . Gastritis and gastroduodenitis   . Gastroesophageal reflux disease with esophagitis   . Lobar pneumonia ([place]) 08/22/2018  . Sepsis due to undetermined organism ([place]) 08/22/2018  . Fever   . Precordial pain    . HCAP (healthcare-associated pneumonia) 08/21/2018  . Elevated transaminase level 08/21/2018  . Atrial tachycardia ([place]) 07/13/2018  . Dyslipidemia 07/13/2018  . Acute ischemic stroke ([place]) 07/12/2018  . Essential hypertension 07/12/2018  . Tobacco abuse 07/12/2018  . Cocaine abuse ([place]) 07/12/2018  . Ischemic stroke ([place])     OCCUPATIONAL THERAPY DISCHARGE SUMMARY  Visits from Start of Care: 10  Current functional level related to goals / functional outcomes: See above   Remaining deficits: See above   Education / Equipment: See above Plan: Patient agrees to discharge.  Patient goals were partially met. Patient is being discharged due to meeting the stated rehab goals.  ?????        Ailene Ravel, OTR/L,CBIS  [phone]  08/23/2019, 12:47 PM  [facility] [address] [place], [place], [address] Phone: [phone]   Fax:  [phone]  Name: Evan Chavez MRN: [id number] Date of Birth: 1961-07-01

## 2019-08-23 NOTE — Patient Instructions (Signed)
Home Exercises Program Theraputty Exercises  Do the following exercises 2-3 times a day using your affected hand.  1. Roll putty into a ball.  2. Make into a pancake.  3. Roll putty into a roll.  4. Pinch along log with first finger and thumb.   5. Make into a ball.  6. Roll it back into a log.   7. Pinch using thumb and side of first finger.  8. Roll into a ball, then flatten into a pancake.  9. Using your fingers, make putty into a mountain.  10. Roll putty back into a ball and squeeze and release 10-12 times.   Coordination Exercises  Perform the following exercises for 15-20 minutes 2-3 times per day. Perform with right hand(s). Perform using big movements.   Flipping Cards: Place deck of cards on the table.   Deal cards: Hold 1/2 or whole deck in your hand. Use thumb to push card off top of deck with one big push.  Flip card between each finger.  Rotate ball with fingertips: Pick up with fingers/thumb and move as much as you can with each turn/movement (clockwise and counter-clockwise). 10 times each direction.  Toss ball from one hand to the other: Toss big/high.  Pick up 5-10 coins one at a time and hold in palm. Then, move coins from palm to fingertips one at a time to stack.

## 2019-09-01 ENCOUNTER — Other Ambulatory Visit: Payer: Self-pay

## 2019-09-01 DIAGNOSIS — E611 Iron deficiency: Secondary | ICD-10-CM

## 2019-09-15 LAB — CBC WITH DIFFERENTIAL/PLATELET
Absolute Monocytes: 418 cells/uL (ref 200–950)
Basophils Absolute: 31 cells/uL (ref 0–200)
Basophils Relative: 0.7 %
Eosinophils Absolute: 40 cells/uL (ref 15–500)
Eosinophils Relative: 0.9 %
HCT: 42.7 % (ref 38.5–50.0)
Hemoglobin: 13.6 g/dL (ref 13.2–17.1)
Lymphs Abs: 1443 cells/uL (ref 850–3900)
MCH: 27.1 pg (ref 27.0–33.0)
MCHC: 31.9 g/dL — ABNORMAL LOW (ref 32.0–36.0)
MCV: 85.2 fL (ref 80.0–100.0)
MPV: 11.1 fL (ref 7.5–12.5)
Monocytes Relative: 9.5 %
Neutro Abs: 2468 cells/uL (ref 1500–7800)
Neutrophils Relative %: 56.1 %
Platelets: 222 10*3/uL (ref 140–400)
RBC: 5.01 10*6/uL (ref 4.20–5.80)
RDW: 12.6 % (ref 11.0–15.0)
Total Lymphocyte: 32.8 %
WBC: 4.4 10*3/uL (ref 3.8–10.8)

## 2019-09-15 LAB — IRON,TIBC AND FERRITIN PANEL
%SAT: 21 % (calc) (ref 20–48)
Ferritin: 32 ng/mL — ABNORMAL LOW (ref 38–380)
Iron: 59 ug/dL (ref 50–180)
TIBC: 281 mcg/dL (calc) (ref 250–425)

## 2019-09-20 ENCOUNTER — Other Ambulatory Visit: Payer: Self-pay

## 2019-09-20 DIAGNOSIS — E611 Iron deficiency: Secondary | ICD-10-CM

## 2019-12-13 ENCOUNTER — Other Ambulatory Visit: Payer: Self-pay

## 2019-12-13 ENCOUNTER — Encounter: Payer: Self-pay | Admitting: Gastroenterology

## 2019-12-13 ENCOUNTER — Ambulatory Visit (INDEPENDENT_AMBULATORY_CARE_PROVIDER_SITE_OTHER): Payer: Medicaid Other | Admitting: Gastroenterology

## 2019-12-13 VITALS — BP 156/91 | HR 78 | Temp 97.6°F | Ht 68.0 in | Wt 150.4 lb

## 2019-12-13 DIAGNOSIS — K2100 Gastro-esophageal reflux disease with esophagitis, without bleeding: Secondary | ICD-10-CM

## 2019-12-13 DIAGNOSIS — D509 Iron deficiency anemia, unspecified: Secondary | ICD-10-CM | POA: Diagnosis not present

## 2019-12-13 DIAGNOSIS — K2270 Barrett's esophagus without dysplasia: Secondary | ICD-10-CM

## 2019-12-13 DIAGNOSIS — E611 Iron deficiency: Secondary | ICD-10-CM

## 2019-12-13 NOTE — Assessment & Plan Note (Signed)
Anemia labs improved.  Hemoglobin normal.  Ferritin up to 32.  Remains on iron twice daily.  Recheck labs at this time.

## 2019-12-13 NOTE — Assessment & Plan Note (Addendum)
Clinically doing well.  Continue pantoprazole 40 mg twice daily.  Reinforced antireflux measures.  Update EGD in 03/2022 for Barrett's esophagus.  Will be due colonoscopy at the same time for tubular adenomas.  Return to the office in 6 months or sooner if needed.

## 2019-12-13 NOTE — Progress Notes (Signed)
Primary Care Physician: Rosita Fire, MD  Primary Gastroenterologist:  Garfield Cornea, MD   Chief Complaint  Patient presents with  . Gastroesophageal Reflux    doing okay, will have occas flare, occas nausea    HPI: Evan Chavez is a 59 y.o. male here for follow-up.  Last seen in September 2020.  He has a history of severe reflux esophagitis and peptic ulcer disease.Hospitalized in November 2019 with an EGD and at that time had severe esophagitis, extensive gastric erosions, duodenal ulceration.  Biopsy revealed Barrett's esophagus but negative for malignancy and H. Pylori.  Repeat EGD in March 2020: Peptic stricture status post balloon dilatation.  Abnormal esophageal mucosa biopsy, completely healed previously noted peptic ulcer disease, hiatal hernia.  Biopsies with Barrett's esophagus without dysplasia or malignancy. Recommended repeat EGD in 3 months for retreatment. When he presented for follow up it was elected to hold off of EGD as he no longer had dysphagia. Will need repeat EGD for Barrett's surveillance in 3 years.   Colonoscopy June 2020 for new IDA. He had 3 tubular adenomas removed ranging from 4 to 8 mm in size.  Next colonoscopy planned in 3 years.  Patient with history of IDA. Rechecked labs in December, iron stores had improved although ferritin was still slightly low at 32, been normal at 13.6, due for repeat at this time.  Patient presents with his mother today.  Appetite is good.  No episodes of vomiting.  Denies heartburn or dysphagia.  No abdominal pain.  Bowel movements regular.  No blood in the stool or melena.  Weight has been stable.  Has had some dental issues, plans upcoming appointment, has been years without dental care.  No longer requiring Reglan or Carafate.     Current Outpatient Medications  Medication Sig Dispense Refill  . acetaminophen (TYLENOL) 500 MG tablet Take 1,000 mg by mouth every 6 (six) hours as needed (for pain).    Marland Kitchen  amantadine (SYMMETREL) 100 MG capsule Take 100 mg by mouth 2 (two) times daily.     Marland Kitchen aspirin 325 MG tablet Take 1 tablet (325 mg total) by mouth daily.    Marland Kitchen atorvastatin (LIPITOR) 40 MG tablet Take 40 mg by mouth daily.    Marland Kitchen diltiazem (CARDIZEM CD) 120 MG 24 hr capsule Take 1 capsule (120 mg total) by mouth daily. (Patient taking differently: Take 120 mg by mouth daily after breakfast. ) 30 capsule 1  . Ferrous Sulfate (IRON PO) Take 65 mg by mouth 2 (two) times daily with a meal.    . Multiple Vitamin (MULTIVITAMIN WITH MINERALS) TABS tablet Take 1 tablet by mouth daily after breakfast.     . pantoprazole (PROTONIX) 40 MG tablet Take 1 tablet (40 mg total) by mouth 2 (two) times daily before a meal. 60 tablet 1   No current facility-administered medications for this visit.    Allergies as of 12/13/2019  . (No Known Allergies)    ROS:  General: Negative for anorexia, weight loss, fever, chills, fatigue, weakness. ENT: Negative for hoarseness, difficulty swallowing , nasal congestion. CV: Negative for chest pain, angina, palpitations, dyspnea on exertion, peripheral edema.  Respiratory: Negative for dyspnea at rest, dyspnea on exertion, cough, sputum, wheezing.  GI: See history of present illness. GU:  Negative for dysuria, hematuria, urinary incontinence, urinary frequency, nocturnal urination.  Endo: Negative for unusual weight change.    Physical Examination:   BP (!) 156/91   Pulse 78   Temp  97.6 F (36.4 C) (Oral)   Ht 5\' 8"  (1.727 m)   Wt 150 lb 6.4 oz (68.2 kg)   BMI 22.87 kg/m   General: Well-nourished, well-developed in no acute distress.  Eyes: No icterus. Mouth: Oropharyngeal mucosa moist and pink , no lesions erythema or exudate.  Poor dentention Lungs: Clear to auscultation bilaterally.  Heart: Regular rate and rhythm, no murmurs rubs or gallops.  Abdomen: Bowel sounds are normal, nontender, nondistended, no hepatosplenomegaly or masses, no abdominal bruits or  hernia , no rebound or guarding.   Extremities: No lower extremity edema. No clubbing or deformities. Neuro: Alert and oriented x 4   Skin: Warm and dry, no jaundice.   Psych: Alert and cooperative, normal mood and affect.  Labs:   Lab Results  Component Value Date   IRON 59 09/14/2019   TIBC 281 09/14/2019   FERRITIN 32 (L) 09/14/2019     Lab Results  Component Value Date   WBC 4.4 09/14/2019   HGB 13.6 09/14/2019   HCT 42.7 09/14/2019   MCV 85.2 09/14/2019   PLT 222 09/14/2019    Imaging Studies: No results found.

## 2019-12-13 NOTE — Patient Instructions (Signed)
1. Continue pantoprazole 40mg  twice daily before a meal. 2. Continue iron twice daily. 3. Have your labs done this month, we will be in touch with results as available.

## 2019-12-24 LAB — IRON,TIBC AND FERRITIN PANEL
%SAT: 18 % (calc) — ABNORMAL LOW (ref 20–48)
Ferritin: 50 ng/mL (ref 38–380)
Iron: 48 ug/dL — ABNORMAL LOW (ref 50–180)
TIBC: 262 mcg/dL (calc) (ref 250–425)

## 2019-12-24 LAB — CBC WITH DIFFERENTIAL/PLATELET
Absolute Monocytes: 344 cells/uL (ref 200–950)
Basophils Absolute: 30 cells/uL (ref 0–200)
Basophils Relative: 0.7 %
Eosinophils Absolute: 69 cells/uL (ref 15–500)
Eosinophils Relative: 1.6 %
HCT: 41.4 % (ref 38.5–50.0)
Hemoglobin: 13.1 g/dL — ABNORMAL LOW (ref 13.2–17.1)
Lymphs Abs: 1501 cells/uL (ref 850–3900)
MCH: 27.5 pg (ref 27.0–33.0)
MCHC: 31.6 g/dL — ABNORMAL LOW (ref 32.0–36.0)
MCV: 87 fL (ref 80.0–100.0)
MPV: 11.1 fL (ref 7.5–12.5)
Monocytes Relative: 8 %
Neutro Abs: 2356 cells/uL (ref 1500–7800)
Neutrophils Relative %: 54.8 %
Platelets: 206 10*3/uL (ref 140–400)
RBC: 4.76 10*6/uL (ref 4.20–5.80)
RDW: 12.5 % (ref 11.0–15.0)
Total Lymphocyte: 34.9 %
WBC: 4.3 10*3/uL (ref 3.8–10.8)

## 2020-01-06 ENCOUNTER — Other Ambulatory Visit: Payer: Self-pay

## 2020-01-06 DIAGNOSIS — Z79899 Other long term (current) drug therapy: Secondary | ICD-10-CM

## 2020-01-06 DIAGNOSIS — R790 Abnormal level of blood mineral: Secondary | ICD-10-CM

## 2020-02-14 ENCOUNTER — Encounter: Payer: Self-pay | Admitting: Emergency Medicine

## 2020-02-14 ENCOUNTER — Other Ambulatory Visit: Payer: Self-pay | Admitting: Emergency Medicine

## 2020-02-14 DIAGNOSIS — R790 Abnormal level of blood mineral: Secondary | ICD-10-CM

## 2020-02-14 DIAGNOSIS — Z79899 Other long term (current) drug therapy: Secondary | ICD-10-CM

## 2020-04-07 LAB — IRON,TIBC AND FERRITIN PANEL
%SAT: 25 % (calc) (ref 20–48)
Ferritin: 51 ng/mL (ref 38–380)
Iron: 72 ug/dL (ref 50–180)
TIBC: 285 mcg/dL (calc) (ref 250–425)

## 2020-04-07 LAB — CBC WITH DIFFERENTIAL/PLATELET
Absolute Monocytes: 328 cells/uL (ref 200–950)
Basophils Absolute: 21 cells/uL (ref 0–200)
Basophils Relative: 0.5 %
Eosinophils Absolute: 59 cells/uL (ref 15–500)
Eosinophils Relative: 1.4 %
HCT: 43.1 % (ref 38.5–50.0)
Hemoglobin: 14.1 g/dL (ref 13.2–17.1)
Lymphs Abs: 1562 cells/uL (ref 850–3900)
MCH: 27.9 pg (ref 27.0–33.0)
MCHC: 32.7 g/dL (ref 32.0–36.0)
MCV: 85.3 fL (ref 80.0–100.0)
MPV: 10.8 fL (ref 7.5–12.5)
Monocytes Relative: 7.8 %
Neutro Abs: 2230 cells/uL (ref 1500–7800)
Neutrophils Relative %: 53.1 %
Platelets: 216 10*3/uL (ref 140–400)
RBC: 5.05 10*6/uL (ref 4.20–5.80)
RDW: 12.4 % (ref 11.0–15.0)
Total Lymphocyte: 37.2 %
WBC: 4.2 10*3/uL (ref 3.8–10.8)

## 2020-05-09 ENCOUNTER — Other Ambulatory Visit: Payer: Self-pay

## 2020-05-09 ENCOUNTER — Encounter (HOSPITAL_COMMUNITY): Payer: Self-pay | Admitting: *Deleted

## 2020-05-09 ENCOUNTER — Emergency Department (HOSPITAL_COMMUNITY)
Admission: EM | Admit: 2020-05-09 | Discharge: 2020-05-09 | Disposition: A | Discharge status: Home / Self Care | Payer: Medicaid Other | Attending: Emergency Medicine | Admitting: Emergency Medicine

## 2020-05-09 ENCOUNTER — Emergency Department (HOSPITAL_COMMUNITY): Payer: Medicaid Other

## 2020-05-09 DIAGNOSIS — Z20822 Contact with and (suspected) exposure to covid-19: Secondary | ICD-10-CM | POA: Insufficient documentation

## 2020-05-09 DIAGNOSIS — J189 Pneumonia, unspecified organism: Secondary | ICD-10-CM | POA: Insufficient documentation

## 2020-05-09 DIAGNOSIS — F1410 Cocaine abuse, uncomplicated: Secondary | ICD-10-CM | POA: Diagnosis not present

## 2020-05-09 DIAGNOSIS — Z87891 Personal history of nicotine dependence: Secondary | ICD-10-CM | POA: Diagnosis not present

## 2020-05-09 DIAGNOSIS — Z79899 Other long term (current) drug therapy: Secondary | ICD-10-CM | POA: Insufficient documentation

## 2020-05-09 DIAGNOSIS — Z7982 Long term (current) use of aspirin: Secondary | ICD-10-CM | POA: Insufficient documentation

## 2020-05-09 DIAGNOSIS — I10 Essential (primary) hypertension: Secondary | ICD-10-CM | POA: Diagnosis not present

## 2020-05-09 DIAGNOSIS — R509 Fever, unspecified: Secondary | ICD-10-CM | POA: Diagnosis present

## 2020-05-09 LAB — COMPREHENSIVE METABOLIC PANEL
ALT: 20 U/L (ref 0–44)
AST: 14 U/L — ABNORMAL LOW (ref 15–41)
Albumin: 4.1 g/dL (ref 3.5–5.0)
Alkaline Phosphatase: 60 U/L (ref 38–126)
Anion gap: 10 (ref 5–15)
BUN: 36 mg/dL — ABNORMAL HIGH (ref 6–20)
CO2: 31 mmol/L (ref 22–32)
Calcium: 10.1 mg/dL (ref 8.9–10.3)
Chloride: 97 mmol/L — ABNORMAL LOW (ref 98–111)
Creatinine, Ser: 1.14 mg/dL (ref 0.61–1.24)
GFR calc Af Amer: >60 mL/min (ref >60)
GFR calc non Af Amer: >60 mL/min (ref >60)
Glucose, Bld: 125 mg/dL — ABNORMAL HIGH (ref 70–99)
Potassium: 4 mmol/L (ref 3.5–5.1)
Sodium: 138 mmol/L (ref 135–145)
Total Bilirubin: 0.3 mg/dL (ref 0.3–1.2)
Total Protein: 7.7 g/dL (ref 6.5–8.1)

## 2020-05-09 LAB — CBC WITH DIFFERENTIAL/PLATELET
Abs Immature Granulocytes: 0.11 10*3/uL — ABNORMAL HIGH (ref 0.00–0.07)
Basophils Absolute: 0 10*3/uL (ref 0.0–0.1)
Basophils Relative: 0 %
Eosinophils Absolute: 0 10*3/uL (ref 0.0–0.5)
Eosinophils Relative: 0 %
HCT: 41.6 % (ref 39.0–52.0)
Hemoglobin: 12.8 g/dL — ABNORMAL LOW (ref 13.0–17.0)
Immature Granulocytes: 1 %
Lymphocytes Relative: 6 %
Lymphs Abs: 1.1 10*3/uL (ref 0.7–4.0)
MCH: 27.4 pg (ref 26.0–34.0)
MCHC: 30.8 g/dL (ref 30.0–36.0)
MCV: 88.9 fL (ref 80.0–100.0)
Monocytes Absolute: 1 10*3/uL (ref 0.1–1.0)
Monocytes Relative: 6 %
Neutro Abs: 15.6 10*3/uL — ABNORMAL HIGH (ref 1.7–7.7)
Neutrophils Relative %: 87 %
Platelets: 210 10*3/uL (ref 150–400)
RBC: 4.68 MIL/uL (ref 4.22–5.81)
RDW: 13 % (ref 11.5–15.5)
WBC: 17.8 10*3/uL — ABNORMAL HIGH (ref 4.0–10.5)
nRBC: 0 % (ref 0.0–0.2)

## 2020-05-09 LAB — SARS CORONAVIRUS 2 BY RT PCR (HOSPITAL ORDER, PERFORMED IN ~~LOC~~ HOSPITAL LAB): SARS Coronavirus 2: NEGATIVE

## 2020-05-09 MED ORDER — SODIUM CHLORIDE 0.9 % IV BOLUS
1000.0000 mL | Freq: Once | INTRAVENOUS | Status: AC
  Administered 2020-05-09: 1000 mL via INTRAVENOUS

## 2020-05-09 MED ORDER — AMOXICILLIN 500 MG PO CAPS: 1000.0000 mg | ORAL_CAPSULE | Freq: Three times a day (TID) | ORAL | 0 refills | Status: DC

## 2020-05-09 MED ORDER — DOXYCYCLINE HYCLATE 100 MG PO TABS: 100.0000 mg | ORAL_TABLET | Freq: Two times a day (BID) | ORAL | 0 refills | Status: DC

## 2020-05-09 MED ORDER — ACETAMINOPHEN 325 MG PO TABS
650.0000 mg | ORAL_TABLET | Freq: Once | ORAL | Status: AC
  Administered 2020-05-09: 650 mg via ORAL
  Filled 2020-05-09: qty 2

## 2020-05-09 MED ORDER — DOXYCYCLINE HYCLATE 100 MG PO TABS
100.0000 mg | ORAL_TABLET | Freq: Once | ORAL | Status: AC
  Administered 2020-05-09: 100 mg via ORAL
  Filled 2020-05-09: qty 1

## 2020-05-09 MED ORDER — AMOXICILLIN 250 MG PO CAPS
1000.0000 mg | ORAL_CAPSULE | Freq: Once | ORAL | Status: AC
  Administered 2020-05-09: 1000 mg via ORAL
  Filled 2020-05-09: qty 4

## 2020-05-09 NOTE — ED Triage Notes (Signed)
Pt unable to give any history but pt has a fever and has had an incontinence of urine

## 2020-05-09 NOTE — Discharge Instructions (Signed)
Return if symptoms worsen or change  °

## 2020-05-10 ENCOUNTER — Emergency Department (HOSPITAL_COMMUNITY): Payer: Medicaid Other

## 2020-05-10 ENCOUNTER — Encounter (HOSPITAL_COMMUNITY): Payer: Self-pay | Admitting: *Deleted

## 2020-05-10 ENCOUNTER — Inpatient Hospital Stay (HOSPITAL_COMMUNITY)
Admission: EM | Admit: 2020-05-10 | Discharge: 2020-07-12 | DRG: 853 | Disposition: A | Discharge status: Skilled Nursing Facility | Payer: Medicaid Other | Attending: Internal Medicine | Admitting: Internal Medicine

## 2020-05-10 ENCOUNTER — Other Ambulatory Visit: Payer: Self-pay

## 2020-05-10 DIAGNOSIS — J869 Pyothorax without fistula: Secondary | ICD-10-CM

## 2020-05-10 DIAGNOSIS — R509 Fever, unspecified: Secondary | ICD-10-CM

## 2020-05-10 DIAGNOSIS — K2270 Barrett's esophagus without dysplasia: Secondary | ICD-10-CM | POA: Diagnosis present

## 2020-05-10 DIAGNOSIS — E440 Moderate protein-calorie malnutrition: Secondary | ICD-10-CM | POA: Diagnosis present

## 2020-05-10 DIAGNOSIS — I10 Essential (primary) hypertension: Secondary | ICD-10-CM | POA: Diagnosis present

## 2020-05-10 DIAGNOSIS — B9562 Methicillin resistant Staphylococcus aureus infection as the cause of diseases classified elsewhere: Secondary | ICD-10-CM | POA: Diagnosis present

## 2020-05-10 DIAGNOSIS — E041 Nontoxic single thyroid nodule: Secondary | ICD-10-CM | POA: Diagnosis present

## 2020-05-10 DIAGNOSIS — A419 Sepsis, unspecified organism: Principal | ICD-10-CM | POA: Diagnosis present

## 2020-05-10 DIAGNOSIS — K9189 Other postprocedural complications and disorders of digestive system: Secondary | ICD-10-CM

## 2020-05-10 DIAGNOSIS — J9601 Acute respiratory failure with hypoxia: Secondary | ICD-10-CM | POA: Diagnosis present

## 2020-05-10 DIAGNOSIS — Z9889 Other specified postprocedural states: Secondary | ICD-10-CM

## 2020-05-10 DIAGNOSIS — R7401 Elevation of levels of liver transaminase levels: Secondary | ICD-10-CM | POA: Diagnosis present

## 2020-05-10 DIAGNOSIS — R5381 Other malaise: Secondary | ICD-10-CM

## 2020-05-10 DIAGNOSIS — E785 Hyperlipidemia, unspecified: Secondary | ICD-10-CM | POA: Diagnosis present

## 2020-05-10 DIAGNOSIS — J9811 Atelectasis: Secondary | ICD-10-CM | POA: Diagnosis present

## 2020-05-10 DIAGNOSIS — K2100 Gastro-esophageal reflux disease with esophagitis, without bleeding: Secondary | ICD-10-CM | POA: Diagnosis present

## 2020-05-10 DIAGNOSIS — E876 Hypokalemia: Secondary | ICD-10-CM | POA: Diagnosis not present

## 2020-05-10 DIAGNOSIS — Z7982 Long term (current) use of aspirin: Secondary | ICD-10-CM

## 2020-05-10 DIAGNOSIS — J690 Pneumonitis due to inhalation of food and vomit: Secondary | ICD-10-CM | POA: Diagnosis present

## 2020-05-10 DIAGNOSIS — J851 Abscess of lung with pneumonia: Secondary | ICD-10-CM | POA: Diagnosis present

## 2020-05-10 DIAGNOSIS — Z681 Body mass index (BMI) 19 or less, adult: Secondary | ICD-10-CM

## 2020-05-10 DIAGNOSIS — R6520 Severe sepsis without septic shock: Secondary | ICD-10-CM | POA: Diagnosis present

## 2020-05-10 DIAGNOSIS — R1310 Dysphagia, unspecified: Secondary | ICD-10-CM

## 2020-05-10 DIAGNOSIS — R0600 Dyspnea, unspecified: Secondary | ICD-10-CM

## 2020-05-10 DIAGNOSIS — Z8673 Personal history of transient ischemic attack (TIA), and cerebral infarction without residual deficits: Secondary | ICD-10-CM

## 2020-05-10 DIAGNOSIS — J9383 Other pneumothorax: Secondary | ICD-10-CM | POA: Diagnosis present

## 2020-05-10 DIAGNOSIS — R000 Tachycardia, unspecified: Secondary | ICD-10-CM | POA: Diagnosis present

## 2020-05-10 DIAGNOSIS — R0602 Shortness of breath: Secondary | ICD-10-CM

## 2020-05-10 DIAGNOSIS — N400 Enlarged prostate without lower urinary tract symptoms: Secondary | ICD-10-CM | POA: Diagnosis present

## 2020-05-10 DIAGNOSIS — L89312 Pressure ulcer of right buttock, stage 2: Secondary | ICD-10-CM | POA: Diagnosis present

## 2020-05-10 DIAGNOSIS — J90 Pleural effusion, not elsewhere classified: Secondary | ICD-10-CM

## 2020-05-10 DIAGNOSIS — Y844 Aspiration of fluid as the cause of abnormal reaction of the patient, or of later complication, without mention of misadventure at the time of the procedure: Secondary | ICD-10-CM | POA: Diagnosis not present

## 2020-05-10 DIAGNOSIS — J181 Lobar pneumonia, unspecified organism: Secondary | ICD-10-CM | POA: Diagnosis present

## 2020-05-10 DIAGNOSIS — I471 Supraventricular tachycardia: Secondary | ICD-10-CM | POA: Diagnosis not present

## 2020-05-10 DIAGNOSIS — J189 Pneumonia, unspecified organism: Secondary | ICD-10-CM

## 2020-05-10 DIAGNOSIS — R0603 Acute respiratory distress: Secondary | ICD-10-CM

## 2020-05-10 DIAGNOSIS — I5032 Chronic diastolic (congestive) heart failure: Secondary | ICD-10-CM | POA: Diagnosis present

## 2020-05-10 DIAGNOSIS — K223 Perforation of esophagus: Secondary | ICD-10-CM

## 2020-05-10 DIAGNOSIS — I4891 Unspecified atrial fibrillation: Secondary | ICD-10-CM | POA: Diagnosis not present

## 2020-05-10 DIAGNOSIS — Z4682 Encounter for fitting and adjustment of non-vascular catheter: Secondary | ICD-10-CM

## 2020-05-10 DIAGNOSIS — Z79899 Other long term (current) drug therapy: Secondary | ICD-10-CM

## 2020-05-10 DIAGNOSIS — Y832 Surgical operation with anastomosis, bypass or graft as the cause of abnormal reaction of the patient, or of later complication, without mention of misadventure at the time of the procedure: Secondary | ICD-10-CM | POA: Diagnosis not present

## 2020-05-10 DIAGNOSIS — F329 Major depressive disorder, single episode, unspecified: Secondary | ICD-10-CM | POA: Diagnosis present

## 2020-05-10 DIAGNOSIS — D62 Acute posthemorrhagic anemia: Secondary | ICD-10-CM | POA: Diagnosis not present

## 2020-05-10 DIAGNOSIS — R0989 Other specified symptoms and signs involving the circulatory and respiratory systems: Secondary | ICD-10-CM

## 2020-05-10 DIAGNOSIS — Z6820 Body mass index (BMI) 20.0-20.9, adult: Secondary | ICD-10-CM

## 2020-05-10 DIAGNOSIS — J918 Pleural effusion in other conditions classified elsewhere: Secondary | ICD-10-CM

## 2020-05-10 DIAGNOSIS — E871 Hypo-osmolality and hyponatremia: Secondary | ICD-10-CM | POA: Diagnosis not present

## 2020-05-10 DIAGNOSIS — Z09 Encounter for follow-up examination after completed treatment for conditions other than malignant neoplasm: Secondary | ICD-10-CM

## 2020-05-10 DIAGNOSIS — J9851 Mediastinitis: Secondary | ICD-10-CM | POA: Diagnosis not present

## 2020-05-10 DIAGNOSIS — L8990 Pressure ulcer of unspecified site, unspecified stage: Secondary | ICD-10-CM | POA: Diagnosis not present

## 2020-05-10 DIAGNOSIS — Z419 Encounter for procedure for purposes other than remedying health state, unspecified: Secondary | ICD-10-CM

## 2020-05-10 DIAGNOSIS — Z20822 Contact with and (suspected) exposure to covid-19: Secondary | ICD-10-CM | POA: Diagnosis present

## 2020-05-10 DIAGNOSIS — Z66 Do not resuscitate: Secondary | ICD-10-CM | POA: Diagnosis present

## 2020-05-10 DIAGNOSIS — Z87891 Personal history of nicotine dependence: Secondary | ICD-10-CM

## 2020-05-10 DIAGNOSIS — R1312 Dysphagia, oropharyngeal phase: Secondary | ICD-10-CM | POA: Diagnosis present

## 2020-05-10 DIAGNOSIS — J939 Pneumothorax, unspecified: Secondary | ICD-10-CM

## 2020-05-10 DIAGNOSIS — F1410 Cocaine abuse, uncomplicated: Secondary | ICD-10-CM | POA: Diagnosis present

## 2020-05-10 DIAGNOSIS — Z9689 Presence of other specified functional implants: Secondary | ICD-10-CM

## 2020-05-10 DIAGNOSIS — J95811 Postprocedural pneumothorax: Secondary | ICD-10-CM | POA: Diagnosis not present

## 2020-05-10 DIAGNOSIS — R0682 Tachypnea, not elsewhere classified: Secondary | ICD-10-CM

## 2020-05-10 DIAGNOSIS — R918 Other nonspecific abnormal finding of lung field: Secondary | ICD-10-CM | POA: Diagnosis present

## 2020-05-10 DIAGNOSIS — I69351 Hemiplegia and hemiparesis following cerebral infarction affecting right dominant side: Secondary | ICD-10-CM

## 2020-05-10 DIAGNOSIS — Z538 Procedure and treatment not carried out for other reasons: Secondary | ICD-10-CM

## 2020-05-10 DIAGNOSIS — J948 Other specified pleural conditions: Secondary | ICD-10-CM | POA: Diagnosis not present

## 2020-05-10 DIAGNOSIS — K222 Esophageal obstruction: Secondary | ICD-10-CM | POA: Diagnosis present

## 2020-05-10 DIAGNOSIS — R739 Hyperglycemia, unspecified: Secondary | ICD-10-CM | POA: Diagnosis present

## 2020-05-10 DIAGNOSIS — I110 Hypertensive heart disease with heart failure: Secondary | ICD-10-CM | POA: Diagnosis present

## 2020-05-10 DIAGNOSIS — E872 Acidosis: Secondary | ICD-10-CM | POA: Diagnosis present

## 2020-05-10 DIAGNOSIS — I480 Paroxysmal atrial fibrillation: Secondary | ICD-10-CM | POA: Diagnosis not present

## 2020-05-10 DIAGNOSIS — E860 Dehydration: Secondary | ICD-10-CM | POA: Diagnosis present

## 2020-05-10 DIAGNOSIS — N132 Hydronephrosis with renal and ureteral calculous obstruction: Secondary | ICD-10-CM

## 2020-05-10 HISTORY — DX: Anemia, unspecified: D64.9

## 2020-05-10 HISTORY — DX: Barrett's esophagus without dysplasia: K22.70

## 2020-05-10 HISTORY — DX: Duodenal ulcer, unspecified as acute or chronic, without hemorrhage or perforation: K26.9

## 2020-05-10 HISTORY — DX: Esophageal obstruction: K22.2

## 2020-05-10 HISTORY — DX: Peptic ulcer, site unspecified, unspecified as acute or chronic, without hemorrhage or perforation: K27.9

## 2020-05-10 HISTORY — DX: Essential (primary) hypertension: I10

## 2020-05-10 HISTORY — DX: Pneumonia, unspecified organism: J18.9

## 2020-05-10 LAB — COMPREHENSIVE METABOLIC PANEL
ALT: 22 U/L (ref 0–44)
AST: 17 U/L (ref 15–41)
Albumin: 3.8 g/dL (ref 3.5–5.0)
Alkaline Phosphatase: 58 U/L (ref 38–126)
Anion gap: 10 (ref 5–15)
BUN: 22 mg/dL — ABNORMAL HIGH (ref 6–20)
CO2: 29 mmol/L (ref 22–32)
Calcium: 9.7 mg/dL (ref 8.9–10.3)
Chloride: 97 mmol/L — ABNORMAL LOW (ref 98–111)
Creatinine, Ser: 1.03 mg/dL (ref 0.61–1.24)
GFR calc Af Amer: >60 mL/min (ref >60)
GFR calc non Af Amer: >60 mL/min (ref >60)
Glucose, Bld: 130 mg/dL — ABNORMAL HIGH (ref 70–99)
Potassium: 3.5 mmol/L (ref 3.5–5.1)
Sodium: 136 mmol/L (ref 135–145)
Total Bilirubin: 0.2 mg/dL — ABNORMAL LOW (ref 0.3–1.2)
Total Protein: 7.4 g/dL (ref 6.5–8.1)

## 2020-05-10 LAB — LACTIC ACID, PLASMA
Lactic Acid, Venous: 2.4 mmol/L (ref 0.5–1.9)
Lactic Acid, Venous: 1.9 mmol/L (ref 0.5–1.9)

## 2020-05-10 LAB — CBC WITH DIFFERENTIAL/PLATELET
Abs Immature Granulocytes: 0.08 10*3/uL — ABNORMAL HIGH (ref 0.00–0.07)
Basophils Absolute: 0 10*3/uL (ref 0.0–0.1)
Basophils Relative: 0 %
Eosinophils Absolute: 0 10*3/uL (ref 0.0–0.5)
Eosinophils Relative: 0 %
HCT: 35.8 % — ABNORMAL LOW (ref 39.0–52.0)
Hemoglobin: 10.9 g/dL — ABNORMAL LOW (ref 13.0–17.0)
Immature Granulocytes: 1 %
Lymphocytes Relative: 8 %
Lymphs Abs: 1.1 10*3/uL (ref 0.7–4.0)
MCH: 27.5 pg (ref 26.0–34.0)
MCHC: 30.4 g/dL (ref 30.0–36.0)
MCV: 90.4 fL (ref 80.0–100.0)
Monocytes Absolute: 1.1 10*3/uL — ABNORMAL HIGH (ref 0.1–1.0)
Monocytes Relative: 8 %
Neutro Abs: 12.2 10*3/uL — ABNORMAL HIGH (ref 1.7–7.7)
Neutrophils Relative %: 83 %
Platelets: 199 10*3/uL (ref 150–400)
RBC: 3.96 MIL/uL — ABNORMAL LOW (ref 4.22–5.81)
RDW: 13.1 % (ref 11.5–15.5)
WBC: 14.6 10*3/uL — ABNORMAL HIGH (ref 4.0–10.5)
nRBC: 0 % (ref 0.0–0.2)

## 2020-05-10 LAB — BRAIN NATRIURETIC PEPTIDE: B Natriuretic Peptide: 146 pg/mL — ABNORMAL HIGH (ref 0.0–100.0)

## 2020-05-10 LAB — TROPONIN I (HIGH SENSITIVITY)
Troponin I (High Sensitivity): 12 ng/L (ref <18)
Troponin I (High Sensitivity): 12 ng/L (ref <18)

## 2020-05-10 LAB — APTT: aPTT: 30 seconds (ref 24–36)

## 2020-05-10 LAB — PROTIME-INR
INR: 1.1 (ref 0.8–1.2)
Prothrombin Time: 13.6 seconds (ref 11.4–15.2)

## 2020-05-10 MED ORDER — SODIUM CHLORIDE 0.9 % IV SOLN
500.0000 mg | INTRAVENOUS | Status: DC
  Administered 2020-05-10 – 2020-05-12 (×3): 500 mg via INTRAVENOUS
  Filled 2020-05-10 (×3): qty 500

## 2020-05-10 MED ORDER — LACTATED RINGERS IV BOLUS (SEPSIS)
1000.0000 mL | Freq: Once | INTRAVENOUS | Status: AC
  Administered 2020-05-10: 1000 mL via INTRAVENOUS

## 2020-05-10 MED ORDER — IOHEXOL 350 MG/ML SOLN
75.0000 mL | Freq: Once | INTRAVENOUS | Status: AC | PRN
  Administered 2020-05-10: 75 mL via INTRAVENOUS

## 2020-05-10 MED ORDER — SODIUM CHLORIDE 0.9 % IV SOLN
2.0000 g | INTRAVENOUS | Status: DC
  Administered 2020-05-10 – 2020-05-14 (×5): 2 g via INTRAVENOUS
  Filled 2020-05-10 (×5): qty 20

## 2020-05-10 NOTE — ED Triage Notes (Signed)
Seen here yesterday and diagnosed with pneumonia, returns today because he is not better

## 2020-05-10 NOTE — ED Notes (Signed)
Attempted to ambulate pt but oxygen sats were 93% while in bed and breathing 40 bpm. MD aware.

## 2020-05-10 NOTE — ED Notes (Signed)
Date and time results received: 05/10/20 2225 (use smartphrase ".now" to insert current time)  Test:Lactic Acid Critical Value: 2.4  Name of Provider Notified: Dr Kathrynn Humble Orders Received? Or Actions Taken?: NA

## 2020-05-10 NOTE — ED Provider Notes (Addendum)
[facility] EMERGENCY DEPARTMENT Provider Note   CSN: [id number] Arrival date & time: 05/10/20  1633     History Chief Complaint  Patient presents with  . Shortness of Breath    Evan Chavez is a 59 y.o. male.  HPI    59 year old male with remote history of cocaine abuse, stroke, hyperlipidemia, GERD comes in a chief complaint of shortness of breath and weakness.  Patient reports that he has been feeling weak for the last couple of days.  He has a cough that is producing clear phlegm and sometimes black speckles in it.  He denies any blood in his sputum.  He has some chest discomfort with cough.  No chest pain with inspiration.  Patient denies any shortness of breath, but does indicate that he feels weaker than usual.  Review of system is negative for any fevers, chills, dizziness.  Patient was seen yesterday in the ER and found to have possible viral pneumonia.  He was started on antibiotics and he reports that he is taking it.  Patient is noted to have elevated heart rate.  He reports that he is taking his blood pressure medication as prescribed.  Additionally he denies any existing cocaine use.  Pt has no hx of PE, DVT and denies any exogenous hormone (testosterone / estrogen) use, long distance travels or surgery in the past 6 weeks, active cancer, recent immobilization.   Past Medical History:  Diagnosis Date  . Cocaine abuse ([place])   . ETOH abuse   . GERD (gastroesophageal reflux disease)   . Hyperlipidemia   . Stroke (cerebrum) ([place]) 06/2018   right sided weakness    Patient Active Problem List   Diagnosis Date Noted  . Barrett's esophagus 06/15/2019  . Peptic stricture of esophagus 03/30/2019  . Anemia 03/30/2019  . Abnormality of esophagus   . Gastritis and gastroduodenitis   . Gastroesophageal reflux disease with esophagitis   . Lobar pneumonia ([place]) 08/22/2018  . Sepsis due to undetermined organism ([place]) 08/22/2018  . Fever   . Precordial pain   .  HCAP (healthcare-associated pneumonia) 08/21/2018  . Elevated transaminase level 08/21/2018  . Atrial tachycardia ([place]) 07/13/2018  . Dyslipidemia 07/13/2018  . Acute ischemic stroke ([place]) 07/12/2018  . Essential hypertension 07/12/2018  . Tobacco abuse 07/12/2018  . Cocaine abuse ([place]) 07/12/2018  . Ischemic stroke [facility])     Past Surgical History:  Procedure Laterality Date  . BIOPSY  08/25/2018   Procedure: BIOPSY;  Surgeon: [name], MD;  Location: [facility] ENDO SU[facility];  Service: Endoscopy;;  . BIOPSY  12/13/2018   Procedure: BIOPSY;  Surgeon: [name], MD;  Location: [facility] ENDO SU[facility];  Service: Endoscopy;;  esophagus  . COLONOSCOPY WITH PROPOFOL N/A 04/07/2019   Dr. [name]: 3 Tubular adenomas removed (4 to 8 mm in size), next colonoscopy 3 years.  . ESOPHAGOGASTRODUODENOSCOPY (EGD) WITH PROPOFOL N/A 08/25/2018   Dr. [name]: Severely inflamed abnormal appearing mid/distal esophagus.  Encroachment on lumen suspicious for infiltrating neoplasm located be all from severe benign inflammation, extensive gastric erosions and extensive duodenal ulcerations.  Query ischemic process versus other.  Biopsy revealed Barrett's esophagus but negative for malignancy and H. pylori.  . ESOPHAGOGASTRODUODENOSCOPY (EGD) WITH PROPOFOL N/A 12/13/2018   Dr. [name]: peptic stricture s/p balloon dilatation. abnormal esophageal mucosa, by c/w barrett's without dysplasia, previous PUD completely healed.   Marland Kitchen POLYPECTOMY  04/07/2019   Procedure: POLYPECTOMY;  Surgeon: [name], MD;  Location: [facility] END[facility];  Service: Endoscopy;;  colon       Family History  Problem Relation Age of Onset  . Hypertension Father   . Heart attack Father   . Heart disease Father   . Hypertension Mother   . GER disease Mother   . Thyroid disease Mother   . Colon cancer Neg Hx   . Colon polyps Neg Hx     Social History   Tobacco Use  . Smoking status: Former Smoker    Packs/day: 1.00    Years: 40.00    Pack years:  40.00    Types: Cigarettes    Quit date: 06/2018    Years since quitting: 1.9  . Smokeless tobacco: Never Used  Vaping Use  . Vaping Use: Never used  Substance Use Topics  . Alcohol use: Not Currently    Alcohol/week: 12.0 standard drinks    Types: 12 Cans of beer per week    Comment: none since 06/2018  . Drug use: Not Currently    Types: Cocaine    Comment: last cocaine use 07/12/18    Home Medications Prior to Admission medications   Medication Sig Start Date End Date Taking? Authorizing Provider  acetaminophen (TYLENOL) 500 MG tablet Take 1,000 mg by mouth every 6 (six) hours as needed (for pain).    [provider]  amantadine (SYMMETREL) 100 MG capsule Take 100 mg by mouth 2 (two) times daily.     [provider]  amoxicillin (AMOXIL) 500 MG capsule Take 2 capsules (1,000 mg total) by mouth 3 (three) times daily. 05/09/20   Fransico Meadow, PA-C  aspirin 325 MG tablet Take 1 tablet (325 mg total) by mouth daily. 07/16/18   Barton Dubois, MD  atorvastatin (LIPITOR) 40 MG tablet Take 40 mg by mouth daily.    [provider]  benzonatate (TESSALON) 100 MG capsule Take 100 mg by mouth 3 (three) times daily. 05/02/20   [provider]  diltiazem (CARDIZEM CD) 120 MG 24 hr capsule Take 1 capsule (120 mg total) by mouth daily. Patient taking differently: Take 120 mg by mouth daily after breakfast.  09/22/18   Soyla Dryer, PA-C  diltiazem (CARDIZEM) 120 MG tablet Take 120 mg by mouth daily. 05/02/20   [provider]  doxycycline (VIBRA-TABS) 100 MG tablet Take 1 tablet (100 mg total) by mouth 2 (two) times daily. 05/09/20   Fransico Meadow, PA-C  Ferrous Sulfate (IRON PO) Take 65 mg by mouth 2 (two) times daily with a meal.    [provider]  loratadine (CLARITIN) 10 MG tablet Take 10 mg by mouth daily. 05/02/20   [provider]  Multiple Vitamin (MULTIVITAMIN WITH MINERALS) TABS tablet Take 1 tablet by mouth daily after  breakfast.     [provider]  pantoprazole (PROTONIX) 40 MG tablet Take 1 tablet (40 mg total) by mouth 2 (two) times daily before a meal. 09/22/18   Soyla Dryer, PA-C  sulfamethoxazole-trimethoprim (BACTRIM DS) 800-160 MG tablet Take 1 tablet by mouth 2 (two) times daily. 5 day course prescribed on 05/04/2020 05/04/20   [provider]  tamsulosin (FLOMAX) 0.4 MG CAPS capsule Take 0.4 mg by mouth daily. 05/04/20   [provider]    Allergies    Patient has no known allergies.  Review of Systems   Review of Systems  Constitutional: Positive for activity change and fatigue. Negative for fever.  Respiratory: Positive for cough.   Cardiovascular: Negative for chest pain.  Gastrointestinal: Negative for nausea and  vomiting.  Neurological: Negative for dizziness.  All other systems reviewed and are negative.   Physical Exam Updated Vital Signs BP (!) 157/106   Pulse (!) 116   Temp 99.8 F (37.7 C)   Resp (!) 40   SpO2 93%   Physical Exam Vitals and nursing note reviewed.  Constitutional:      Appearance: He is well-developed.  HENT:     Head: Atraumatic.  Cardiovascular:     Rate and Rhythm: Tachycardia present.     Pulses: Normal pulses.  Pulmonary:     Effort: Pulmonary effort is normal.     Breath sounds: No decreased breath sounds, wheezing, rhonchi or rales.  Musculoskeletal:     Cervical back: Neck supple.     Right lower leg: No tenderness. No edema.     Left lower leg: No tenderness. No edema.  Skin:    General: Skin is warm.  Neurological:     Mental Status: He is alert and oriented to person, place, and time.     ED Results / Procedures / Treatments   Labs (all labs ordered are listed, but only abnormal results are displayed) Labs Reviewed  LACTIC ACID, PLASMA - Abnormal; Notable for the following components:      Result Value   Lactic Acid, Venous 2.4 (*)    All other components within normal limits  COMPREHENSIVE  METABOLIC PANEL - Abnormal; Notable for the following components:   Chloride 97 (*)    Glucose, Bld 130 (*)    BUN 22 (*)    Total Bilirubin 0.2 (*)    All other components within normal limits  CBC WITH DIFFERENTIAL/PLATELET - Abnormal; Notable for the following components:   WBC 14.6 (*)    RBC 3.96 (*)    Hemoglobin 10.9 (*)    HCT 35.8 (*)    Neutro Abs 12.2 (*)    Monocytes Absolute 1.1 (*)    Abs Immature Granulocytes 0.08 (*)    All other components within normal limits  BRAIN NATRIURETIC PEPTIDE - Abnormal; Notable for the following components:   B Natriuretic Peptide 146.0 (*)    All other components within normal limits  CULTURE, BLOOD (ROUTINE X 2)  CULTURE, BLOOD (ROUTINE X 2)  URINE CULTURE  SARS CORONAVIRUS 2 BY RT PCR (HOSPITAL ORDER, [place] [facility])  LACTIC ACID, PLASMA  PROTIME-INR  APTT  URINALYSIS, ROUTINE W REFLEX MICROSCOPIC  TROPONIN I (HIGH SENSITIVITY)  TROPONIN I (HIGH SENSITIVITY)    EKG EKG Interpretation  Date/Time:  Thursday May 10 2020 21:33:51 EDT Ventricular Rate:  119 PR Interval:    QRS Duration: 94 QT Interval:  316 QTC Calculation: 445 R Axis:   86 Text Interpretation: Sinus tachycardia Multiple premature complexes, vent & supraven Aberrant conduction of SV complex(es) Probable anterolateral infarct, age indeterm Abnormal T, consider ischemia, lateral leads Nonspecific ST and T wave abnormality Confirmed by Varney Biles ([id number]) on 05/10/2020 10:20:52 PM   Radiology DG Chest Port 1 View  Result Date: 05/10/2020 CLINICAL DATA:  Questionable sepsis.  Pneumonia diagnosis yesterday. EXAM: PORTABLE CHEST 1 VIEW COMPARISON:  Radiograph yesterday. FINDINGS: Upper normal heart size. Unchanged mediastinal contours. Hazy symmetric bibasilar opacities are unchanged from radiograph yesterday. No pulmonary edema. No pneumothorax. IMPRESSION: Unchanged symmetric bibasilar opacities are unchanged from yesterday, likely  combination of pleural effusions and atelectasis/pneumonia. Electronically Signed   By: Keith Rake M.D.   On: 05/10/2020 21:24   DG Chest Port 1 View  Result Date:  05/09/2020 CLINICAL DATA:  Cough and fever. EXAM: PORTABLE CHEST 1 VIEW COMPARISON:  09/06/2018 FINDINGS: Heart size is normal. There are bilateral pleural effusions and infiltrate/atelectasis in both lower lungs, consistent with pneumonia. Upper lungs are clear. IMPRESSION: Bilateral lower lobe atelectasis and pneumonia.  Small effusions. Electronically Signed   By: Nelson Chimes M.D.   On: 05/09/2020 14:16    Procedures Procedures (including critical care time)  Medications Ordered in ED Medications  cefTRIAXone (ROCEPHIN) 2 g in sodium chloride 0.9 % 100 mL IVPB (2 g Intravenous New Bag/Given 05/10/20 2156)  azithromycin (ZITHROMAX) 500 mg in sodium chloride 0.9 % 250 mL IVPB (500 mg Intravenous New Bag/Given 05/10/20 2215)  lactated ringers bolus 1,000 mL (0 mLs Intravenous Stopped 05/10/20 2347)  iohexol (OMNIPAQUE) 350 MG/ML injection 75 mL (75 mLs Intravenous Contrast Given 05/10/20 2339)    ED Course  I have reviewed the triage vital signs and the nursing notes.  Pertinent labs & imaging results that were available during my care of the patient were reviewed by me and considered in my medical decision making (see chart for details).  Clinical Course as of May 10 2350  Thu May 10, 2020  2350 Patient's white count today is better.  Lactic acid is cleared.  When we attempted ambulatory pulse ox, patient's heart rate jumped up to 140 and he started breathing 40 times a minute.  His O2 sats are 93%.  On reassessment he continues to have tachypnea.  We will get a CT angiogram to rule out PE.  Patient will need admission however.  Repeat Covid test has been ordered.   [AN]    Clinical Course User Index [AN] Varney Biles, MD   MDM Rules/Calculators/A&P                          59 year old comes in a chief complaint of  weakness primarily. He is having a cough and may be some shortness of breath.  He denies any clear association of shortness of breath on exertion.  He does not have orthopnea, PND.  Patient is noted to be tachycardic.  He was seen yesterday and had work-up that showed elevated white count and questionable pneumonia.  He has taken antibiotics without any change.  There is no history of PE, DVT.  No cardiac disease history and patient denies any substance abuse.  On exam besides the tachycardia, we did not have any specific concerning findings.  Lung exam was quite clear.  Patient was coughing however therefore, I think we will carry on with the diagnosis of CAP and treat accordingly.  We will get labs to see if there is any interval change. Based on history and exam, PE and dissection are unlikely given that he has equal robust pulse bilaterally without any neurologic findings and no new murmur on my exam.  Patient also does not have any pleurisy and EKG is unchanged.  No hypoxia.  Patient was tested for COVID-19 yesterday and was negative.  We will reassess the patient after lab work-up to decide on the disposition  Final Clinical Impression(s) / ED Diagnoses Final diagnoses:  Respiratory distress    Rx / DC Orders ED Discharge Orders    None       Varney Biles, MD 05/10/20 [id number]    Varney Biles, MD 05/10/20 2351

## 2020-05-10 NOTE — ED Provider Notes (Signed)
[facility] EMERGENCY DEPARTMENT Provider Note   CSN: [id number] Arrival date & time: 05/09/20  1156     History Chief Complaint  Patient presents with  . Fever    Evan Chavez is a 59 y.o. male.  The history is provided by the patient. No language interpreter was used.  Fever Max temp prior to arrival:  100 Temp source:  Oral Severity:  Moderate Onset quality:  Gradual Duration:  3 days Timing:  Constant Progression:  Worsening Chronicity:  New Relieved by:  Nothing Worsened by:  Nothing Ineffective treatments:  None tried Associated symptoms: no chest pain   Risk factors: no contaminated food and no sick contacts   Pt complains of a fever and cough.  Pt has a history of a cva.  Pt here with his Mother who he lives with      Past Medical History:  Diagnosis Date  . Cocaine abuse ([place])   . ETOH abuse   . GERD (gastroesophageal reflux disease)   . Hyperlipidemia   . Stroke (cerebrum) ([place]) 06/2018   right sided weakness    Patient Active Problem List   Diagnosis Date Noted  . Barrett's esophagus 06/15/2019  . Peptic stricture of esophagus 03/30/2019  . Anemia 03/30/2019  . Abnormality of esophagus   . Gastritis and gastroduodenitis   . Gastroesophageal reflux disease with esophagitis   . Lobar pneumonia ([place]) 08/22/2018  . Sepsis due to undetermined organism ([place]) 08/22/2018  . Fever   . Precordial pain   . HCAP (healthcare-associated pneumonia) 08/21/2018  . Elevated transaminase level 08/21/2018  . Atrial tachycardia ([place]) 07/13/2018  . Dyslipidemia 07/13/2018  . Acute ischemic stroke ([place]) 07/12/2018  . Essential hypertension 07/12/2018  . Tobacco abuse 07/12/2018  . Cocaine abuse ([place]) 07/12/2018  . Ischemic stroke [facility])     Past Surgical History:  Procedure Laterality Date  . BIOPSY  08/25/2018   Procedure: BIOPSY;  Surgeon: [name], MD;  [facility];  Service: Endoscopy;;  . BIOPSY  12/13/2018   Procedure: BIOPSY;   Surgeon: [name], MD;  Location[facility];  Service: Endoscopy;;  esophagus  . COLONOSCOPY WITH PROPOFOL N/A 04/07/2019   Dr. [name]: 3 Tubular adenomas removed (4 to 8 mm in size), next colonoscopy 3 years.  . ESOPHAGOGASTRODUODENOSCOPY (EGD) WITH PROPOFOL N/A 08/25/2018   Dr. [name]: Severely inflamed abnormal appearing mid/distal esophagus.  Encroachment on lumen suspicious for infiltrating neoplasm located be all from severe benign inflammation, extensive gastric erosions and extensive duodenal ulcerations.  Query ischemic process versus other.  Biopsy revealed Barrett's esophagus but negative for malignancy and H. pylori.  . ESOPHAGOGASTRODUODENOSCOPY (EGD) WITH PROPOFOL N/A 12/13/2018   Dr. [name]: peptic stricture s/p balloon dilatation. abnormal esophageal mucosa, by c/w barrett's without dysplasia, previous PUD completely healed.   Marland Kitchen POLYPECTOMY  04/07/2019   Procedure: POLYPECTOMY;  Surgeon: [name], MD;  [facility];  Service: Endoscopy;;  colon       Family History  Problem Relation Age of Onset  . Hypertension Father   . Heart attack Father   . Heart disease Father   . Hypertension Mother   . GER disease Mother   . Thyroid disease Mother   . Colon cancer Neg Hx   . Colon polyps Neg Hx     Social History   Tobacco Use  . Smoking status: Former Smoker    Packs/day: 1.00    Years: 40.00    Pack years:  40.00    Types: Cigarettes    Quit date: 06/2018    Years since quitting: 1.9  . Smokeless tobacco: Never Used  Vaping Use  . Vaping Use: Never used  Substance Use Topics  . Alcohol use: Not Currently    Alcohol/week: 12.0 standard drinks    Types: 12 Cans of beer per week    Comment: none since 06/2018  . Drug use: Not Currently    Types: Cocaine    Comment: last cocaine use 07/12/18    Home Medications Prior to Admission medications   Medication Sig Start Date End Date Taking? Authorizing Provider  acetaminophen (TYLENOL) 500 MG  tablet Take 1,000 mg by mouth every 6 (six) hours as needed (for pain).    [provider]  amantadine (SYMMETREL) 100 MG capsule Take 100 mg by mouth 2 (two) times daily.     [provider]  amoxicillin (AMOXIL) 500 MG capsule Take 2 capsules (1,000 mg total) by mouth 3 (three) times daily. 05/09/20   Fransico Meadow, PA-C  aspirin 325 MG tablet Take 1 tablet (325 mg total) by mouth daily. 07/16/18   Barton Dubois, MD  atorvastatin (LIPITOR) 40 MG tablet Take 40 mg by mouth daily.    [provider]  diltiazem (CARDIZEM CD) 120 MG 24 hr capsule Take 1 capsule (120 mg total) by mouth daily. Patient taking differently: Take 120 mg by mouth daily after breakfast.  09/22/18   Soyla Dryer, PA-C  doxycycline (VIBRA-TABS) 100 MG tablet Take 1 tablet (100 mg total) by mouth 2 (two) times daily. 05/09/20   Fransico Meadow, PA-C  Ferrous Sulfate (IRON PO) Take 65 mg by mouth 2 (two) times daily with a meal.    [provider]  Multiple Vitamin (MULTIVITAMIN WITH MINERALS) TABS tablet Take 1 tablet by mouth daily after breakfast.     [provider]  pantoprazole (PROTONIX) 40 MG tablet Take 1 tablet (40 mg total) by mouth 2 (two) times daily before a meal. 09/22/18   Soyla Dryer, PA-C    Allergies    Patient has no known allergies.  Review of Systems   Review of Systems  Constitutional: Positive for fever.  Cardiovascular: Negative for chest pain.  All other systems reviewed and are negative.   Physical Exam Updated Vital Signs BP (!) 134/97 (BP Location: Right Arm)   Pulse (!) 108   Temp 97.9 F (36.6 C) (Oral)   Resp 18   Ht 5\' 9"  (1.753 m)   Wt 69.4 kg   SpO2 100%   BMI 22.59 kg/m   Physical Exam Vitals and nursing note reviewed.  Constitutional:      Appearance: He is well-developed.  HENT:     Head: Normocephalic and atraumatic.     Right Ear: Tympanic membrane normal.     Left Ear: Tympanic membrane normal.     Nose: Nose  normal.     Mouth/Throat:     Mouth: Mucous membranes are moist.  Eyes:     Conjunctiva/sclera: Conjunctivae normal.  Cardiovascular:     Rate and Rhythm: Normal rate and regular rhythm.     Heart sounds: No murmur heard.   Pulmonary:     Effort: Pulmonary effort is normal. No respiratory distress.     Breath sounds: Normal breath sounds.  Abdominal:     Palpations: Abdomen is soft.     Tenderness: There is no abdominal tenderness.  Musculoskeletal:        General:  Normal range of motion.     Cervical back: Neck supple.  Skin:    General: Skin is warm and dry.  Neurological:     General: No focal deficit present.     Mental Status: He is alert.  Psychiatric:        Mood and Affect: Mood normal.     ED Results / Procedures / Treatments   Labs (all labs ordered are listed, but only abnormal results are displayed) Labs Reviewed  CBC WITH DIFFERENTIAL/PLATELET - Abnormal; Notable for the following components:      Result Value   WBC 17.8 (*)    Hemoglobin 12.8 (*)    Neutro Abs 15.6 (*)    Abs Immature Granulocytes 0.11 (*)    All other components within normal limits  COMPREHENSIVE METABOLIC PANEL - Abnormal; Notable for the following components:   Chloride 97 (*)    Glucose, Bld 125 (*)    BUN 36 (*)    AST 14 (*)    All other components within normal limits  SARS CORONAVIRUS 2 BY RT PCR (HOSPITAL ORDER, [place] [facility])    EKG None  Radiology DG Chest Port 1 View  Result Date: 05/09/2020 CLINICAL DATA:  Cough and fever. EXAM: PORTABLE CHEST 1 VIEW COMPARISON:  09/06/2018 FINDINGS: Heart size is normal. There are bilateral pleural effusions and infiltrate/atelectasis in both lower lungs, consistent with pneumonia. Upper lungs are clear. IMPRESSION: Bilateral lower lobe atelectasis and pneumonia.  Small effusions. Electronically Signed   By: Nelson Chimes M.D.   On: 05/09/2020 14:16    Procedures Procedures (including critical care  time)  Medications Ordered in ED Medications  amoxicillin (AMOXIL) capsule 1,000 mg (1,000 mg Oral Given 05/09/20 1849)  doxycycline (VIBRA-TABS) tablet 100 mg (100 mg Oral Given 05/09/20 1849)  sodium chloride 0.9 % bolus 1,000 mL (0 mLs Intravenous Stopped 05/09/20 2057)  acetaminophen (TYLENOL) tablet 650 mg (650 mg Oral Given 05/09/20 1913)    ED Course  I have reviewed the triage vital signs and the nursing notes.  Pertinent labs & imaging results that were available during my care of the patient were reviewed by me and considered in my medical decision making (see chart for details).    MDM Rules/Calculators/A&P                          MDM:  Chest xray shows pneumonia,  Pt given Iv fluids x 1 liter  Heart rate improved.  Pt given rx for amoxicillian and doxycycline.  Pt advised to follow up with primary care.   Final Clinical Impression(s) / ED Diagnoses Final diagnoses:  Community acquired pneumonia, unspecified laterality    Rx / DC Orders ED Discharge Orders         Ordered    amoxicillin (AMOXIL) 500 MG capsule  3 times daily     Discontinue  Reprint     05/09/20 1835    doxycycline (VIBRA-TABS) 100 MG tablet  2 times daily     Discontinue  Reprint     05/09/20 1835        An After Visit Summary was printed and given to the patient.    Fransico Meadow, [place] 05/10/20 1846    Fredia Sorrow, MD 05/11/20 [phone]

## 2020-05-11 ENCOUNTER — Other Ambulatory Visit (HOSPITAL_COMMUNITY): Payer: Self-pay | Admitting: Family Medicine

## 2020-05-11 ENCOUNTER — Encounter (HOSPITAL_COMMUNITY): Payer: Self-pay | Admitting: Internal Medicine

## 2020-05-11 ENCOUNTER — Inpatient Hospital Stay (HOSPITAL_COMMUNITY): Payer: Medicaid Other

## 2020-05-11 ENCOUNTER — Other Ambulatory Visit: Payer: Self-pay

## 2020-05-11 DIAGNOSIS — Z20822 Contact with and (suspected) exposure to covid-19: Secondary | ICD-10-CM | POA: Diagnosis not present

## 2020-05-11 DIAGNOSIS — Z9689 Presence of other specified functional implants: Secondary | ICD-10-CM | POA: Diagnosis not present

## 2020-05-11 DIAGNOSIS — I10 Essential (primary) hypertension: Secondary | ICD-10-CM

## 2020-05-11 DIAGNOSIS — Z66 Do not resuscitate: Secondary | ICD-10-CM | POA: Diagnosis not present

## 2020-05-11 DIAGNOSIS — Y832 Surgical operation with anastomosis, bypass or graft as the cause of abnormal reaction of the patient, or of later complication, without mention of misadventure at the time of the procedure: Secondary | ICD-10-CM | POA: Diagnosis not present

## 2020-05-11 DIAGNOSIS — D62 Acute posthemorrhagic anemia: Secondary | ICD-10-CM | POA: Diagnosis not present

## 2020-05-11 DIAGNOSIS — K223 Perforation of esophagus: Secondary | ICD-10-CM | POA: Diagnosis not present

## 2020-05-11 DIAGNOSIS — Y844 Aspiration of fluid as the cause of abnormal reaction of the patient, or of later complication, without mention of misadventure at the time of the procedure: Secondary | ICD-10-CM | POA: Diagnosis not present

## 2020-05-11 DIAGNOSIS — J869 Pyothorax without fistula: Secondary | ICD-10-CM | POA: Diagnosis present

## 2020-05-11 DIAGNOSIS — N400 Enlarged prostate without lower urinary tract symptoms: Secondary | ICD-10-CM | POA: Diagnosis present

## 2020-05-11 DIAGNOSIS — N401 Enlarged prostate with lower urinary tract symptoms: Secondary | ICD-10-CM

## 2020-05-11 DIAGNOSIS — I471 Supraventricular tachycardia: Secondary | ICD-10-CM

## 2020-05-11 DIAGNOSIS — I4891 Unspecified atrial fibrillation: Secondary | ICD-10-CM | POA: Diagnosis not present

## 2020-05-11 DIAGNOSIS — J95811 Postprocedural pneumothorax: Secondary | ICD-10-CM | POA: Diagnosis not present

## 2020-05-11 DIAGNOSIS — J690 Pneumonitis due to inhalation of food and vomit: Secondary | ICD-10-CM | POA: Diagnosis not present

## 2020-05-11 DIAGNOSIS — E785 Hyperlipidemia, unspecified: Secondary | ICD-10-CM

## 2020-05-11 DIAGNOSIS — F1410 Cocaine abuse, uncomplicated: Secondary | ICD-10-CM | POA: Diagnosis present

## 2020-05-11 DIAGNOSIS — J9601 Acute respiratory failure with hypoxia: Secondary | ICD-10-CM | POA: Diagnosis not present

## 2020-05-11 DIAGNOSIS — E872 Acidosis: Secondary | ICD-10-CM | POA: Diagnosis not present

## 2020-05-11 DIAGNOSIS — R0602 Shortness of breath: Secondary | ICD-10-CM | POA: Diagnosis present

## 2020-05-11 DIAGNOSIS — Z681 Body mass index (BMI) 19 or less, adult: Secondary | ICD-10-CM | POA: Diagnosis not present

## 2020-05-11 DIAGNOSIS — K2100 Gastro-esophageal reflux disease with esophagitis, without bleeding: Secondary | ICD-10-CM

## 2020-05-11 DIAGNOSIS — Z8719 Personal history of other diseases of the digestive system: Secondary | ICD-10-CM | POA: Diagnosis not present

## 2020-05-11 DIAGNOSIS — J90 Pleural effusion, not elsewhere classified: Secondary | ICD-10-CM

## 2020-05-11 DIAGNOSIS — R1312 Dysphagia, oropharyngeal phase: Secondary | ICD-10-CM | POA: Diagnosis not present

## 2020-05-11 DIAGNOSIS — J851 Abscess of lung with pneumonia: Secondary | ICD-10-CM | POA: Diagnosis not present

## 2020-05-11 DIAGNOSIS — J189 Pneumonia, unspecified organism: Secondary | ICD-10-CM | POA: Diagnosis not present

## 2020-05-11 DIAGNOSIS — R918 Other nonspecific abnormal finding of lung field: Secondary | ICD-10-CM | POA: Diagnosis not present

## 2020-05-11 DIAGNOSIS — E871 Hypo-osmolality and hyponatremia: Secondary | ICD-10-CM | POA: Diagnosis not present

## 2020-05-11 DIAGNOSIS — K9189 Other postprocedural complications and disorders of digestive system: Secondary | ICD-10-CM | POA: Diagnosis not present

## 2020-05-11 DIAGNOSIS — R933 Abnormal findings on diagnostic imaging of other parts of digestive tract: Secondary | ICD-10-CM | POA: Diagnosis not present

## 2020-05-11 DIAGNOSIS — R350 Frequency of micturition: Secondary | ICD-10-CM

## 2020-05-11 DIAGNOSIS — J948 Other specified pleural conditions: Secondary | ICD-10-CM | POA: Diagnosis not present

## 2020-05-11 DIAGNOSIS — N132 Hydronephrosis with renal and ureteral calculous obstruction: Secondary | ICD-10-CM | POA: Diagnosis not present

## 2020-05-11 DIAGNOSIS — E041 Nontoxic single thyroid nodule: Secondary | ICD-10-CM

## 2020-05-11 DIAGNOSIS — J181 Lobar pneumonia, unspecified organism: Secondary | ICD-10-CM | POA: Diagnosis not present

## 2020-05-11 DIAGNOSIS — R0603 Acute respiratory distress: Secondary | ICD-10-CM | POA: Diagnosis not present

## 2020-05-11 DIAGNOSIS — A419 Sepsis, unspecified organism: Secondary | ICD-10-CM | POA: Diagnosis not present

## 2020-05-11 DIAGNOSIS — I69351 Hemiplegia and hemiparesis following cerebral infarction affecting right dominant side: Secondary | ICD-10-CM | POA: Diagnosis not present

## 2020-05-11 DIAGNOSIS — Z9889 Other specified postprocedural states: Secondary | ICD-10-CM | POA: Diagnosis not present

## 2020-05-11 DIAGNOSIS — R000 Tachycardia, unspecified: Secondary | ICD-10-CM | POA: Diagnosis not present

## 2020-05-11 DIAGNOSIS — J9383 Other pneumothorax: Secondary | ICD-10-CM | POA: Diagnosis not present

## 2020-05-11 DIAGNOSIS — R1032 Left lower quadrant pain: Secondary | ICD-10-CM | POA: Diagnosis not present

## 2020-05-11 DIAGNOSIS — J918 Pleural effusion in other conditions classified elsewhere: Secondary | ICD-10-CM | POA: Diagnosis not present

## 2020-05-11 DIAGNOSIS — I5032 Chronic diastolic (congestive) heart failure: Secondary | ICD-10-CM | POA: Diagnosis present

## 2020-05-11 DIAGNOSIS — J939 Pneumothorax, unspecified: Secondary | ICD-10-CM | POA: Diagnosis not present

## 2020-05-11 DIAGNOSIS — J9851 Mediastinitis: Secondary | ICD-10-CM | POA: Diagnosis not present

## 2020-05-11 DIAGNOSIS — E440 Moderate protein-calorie malnutrition: Secondary | ICD-10-CM | POA: Diagnosis present

## 2020-05-11 DIAGNOSIS — R509 Fever, unspecified: Secondary | ICD-10-CM | POA: Diagnosis not present

## 2020-05-11 DIAGNOSIS — R7401 Elevation of levels of liver transaminase levels: Secondary | ICD-10-CM | POA: Diagnosis not present

## 2020-05-11 DIAGNOSIS — J9811 Atelectasis: Secondary | ICD-10-CM | POA: Diagnosis not present

## 2020-05-11 LAB — CBC
HCT: 31.8 % — ABNORMAL LOW (ref 39.0–52.0)
Hemoglobin: 9.5 g/dL — ABNORMAL LOW (ref 13.0–17.0)
MCH: 27.5 pg (ref 26.0–34.0)
MCHC: 29.9 g/dL — ABNORMAL LOW (ref 30.0–36.0)
MCV: 92.2 fL (ref 80.0–100.0)
Platelets: 159 10*3/uL (ref 150–400)
RBC: 3.45 MIL/uL — ABNORMAL LOW (ref 4.22–5.81)
RDW: 13.2 % (ref 11.5–15.5)
WBC: 12.9 10*3/uL — ABNORMAL HIGH (ref 4.0–10.5)
nRBC: 0 % (ref 0.0–0.2)

## 2020-05-11 LAB — COMPREHENSIVE METABOLIC PANEL
ALT: 25 U/L (ref 0–44)
AST: 21 U/L (ref 15–41)
Albumin: 3.2 g/dL — ABNORMAL LOW (ref 3.5–5.0)
Alkaline Phosphatase: 51 U/L (ref 38–126)
Anion gap: 9 (ref 5–15)
BUN: 18 mg/dL (ref 6–20)
CO2: 23 mmol/L (ref 22–32)
Calcium: 8.6 mg/dL — ABNORMAL LOW (ref 8.9–10.3)
Chloride: 103 mmol/L (ref 98–111)
Creatinine, Ser: 0.93 mg/dL (ref 0.61–1.24)
GFR calc Af Amer: >60 mL/min (ref >60)
GFR calc non Af Amer: >60 mL/min (ref >60)
Glucose, Bld: 118 mg/dL — ABNORMAL HIGH (ref 70–99)
Potassium: 3.8 mmol/L (ref 3.5–5.1)
Sodium: 135 mmol/L (ref 135–145)
Total Bilirubin: 0.4 mg/dL (ref 0.3–1.2)
Total Protein: 6.5 g/dL (ref 6.5–8.1)

## 2020-05-11 LAB — SARS CORONAVIRUS 2 BY RT PCR (HOSPITAL ORDER, PERFORMED IN ~~LOC~~ HOSPITAL LAB): SARS Coronavirus 2: NEGATIVE

## 2020-05-11 LAB — URINALYSIS, ROUTINE W REFLEX MICROSCOPIC
Bacteria, UA: NONE SEEN
Bilirubin Urine: NEGATIVE
Glucose, UA: NEGATIVE mg/dL
Hgb urine dipstick: NEGATIVE
Ketones, ur: NEGATIVE mg/dL
Leukocytes,Ua: NEGATIVE
Nitrite: NEGATIVE
Protein, ur: 30 mg/dL — AB
Specific Gravity, Urine: 1.035 — ABNORMAL HIGH (ref 1.005–1.030)
pH: 7 (ref 5.0–8.0)

## 2020-05-11 LAB — PROTIME-INR
INR: 1.1 (ref 0.8–1.2)
Prothrombin Time: 13.8 seconds (ref 11.4–15.2)

## 2020-05-11 LAB — STREP PNEUMONIAE URINARY ANTIGEN: Strep Pneumo Urinary Antigen: NEGATIVE

## 2020-05-11 LAB — ECHOCARDIOGRAM COMPLETE
Area-P 1/2: 6.65 cm2
S' Lateral: 2.68 cm

## 2020-05-11 LAB — PHOSPHORUS: Phosphorus: 2.5 mg/dL (ref 2.5–4.6)

## 2020-05-11 LAB — MAGNESIUM: Magnesium: 1.7 mg/dL (ref 1.7–2.4)

## 2020-05-11 LAB — HIV ANTIBODY (ROUTINE TESTING W REFLEX): HIV Screen 4th Generation wRfx: NONREACTIVE

## 2020-05-11 LAB — APTT: aPTT: 31 seconds (ref 24–36)

## 2020-05-11 MED ORDER — ACETAMINOPHEN 500 MG PO TABS
1000.0000 mg | ORAL_TABLET | Freq: Four times a day (QID) | ORAL | Status: DC | PRN
  Administered 2020-05-13: 1000 mg via ORAL
  Filled 2020-05-11 (×2): qty 2

## 2020-05-11 MED ORDER — ASPIRIN 325 MG PO TABS
325.0000 mg | ORAL_TABLET | Freq: Every day | ORAL | Status: DC
  Administered 2020-05-11 – 2020-05-15 (×5): 325 mg via ORAL
  Filled 2020-05-11 (×5): qty 1

## 2020-05-11 MED ORDER — TAMSULOSIN HCL 0.4 MG PO CAPS
0.4000 mg | ORAL_CAPSULE | Freq: Every day | ORAL | Status: DC
  Administered 2020-05-11 – 2020-05-15 (×5): 0.4 mg via ORAL
  Filled 2020-05-11 (×5): qty 1

## 2020-05-11 MED ORDER — FUROSEMIDE 10 MG/ML IJ SOLN
40.0000 mg | Freq: Once | INTRAMUSCULAR | Status: AC
  Administered 2020-05-11: 40 mg via INTRAVENOUS
  Filled 2020-05-11: qty 4

## 2020-05-11 MED ORDER — DM-GUAIFENESIN ER 30-600 MG PO TB12
1.0000 | ORAL_TABLET | Freq: Two times a day (BID) | ORAL | Status: DC
  Administered 2020-05-11 – 2020-05-15 (×10): 1 via ORAL
  Filled 2020-05-11 (×10): qty 1

## 2020-05-11 MED ORDER — DILTIAZEM HCL ER COATED BEADS 120 MG PO CP24
120.0000 mg | ORAL_CAPSULE | Freq: Every day | ORAL | Status: DC
  Administered 2020-05-11 – 2020-05-15 (×5): 120 mg via ORAL
  Filled 2020-05-11 (×8): qty 1

## 2020-05-11 MED ORDER — AMANTADINE HCL 100 MG PO CAPS
100.0000 mg | ORAL_CAPSULE | Freq: Two times a day (BID) | ORAL | Status: DC
  Administered 2020-05-11 – 2020-05-15 (×9): 100 mg via ORAL
  Filled 2020-05-11 (×21): qty 1

## 2020-05-11 MED ORDER — ATORVASTATIN CALCIUM 40 MG PO TABS
40.0000 mg | ORAL_TABLET | Freq: Every day | ORAL | Status: DC
  Administered 2020-05-11 – 2020-05-15 (×5): 40 mg via ORAL
  Filled 2020-05-11 (×5): qty 1

## 2020-05-11 MED ORDER — PANTOPRAZOLE SODIUM 40 MG PO TBEC
40.0000 mg | DELAYED_RELEASE_TABLET | Freq: Two times a day (BID) | ORAL | Status: DC
  Administered 2020-05-11 – 2020-05-16 (×12): 40 mg via ORAL
  Filled 2020-05-11 (×12): qty 1

## 2020-05-11 MED ORDER — ACETAMINOPHEN 325 MG PO TABS
650.0000 mg | ORAL_TABLET | Freq: Four times a day (QID) | ORAL | Status: DC | PRN
  Administered 2020-05-13 – 2020-05-14 (×2): 650 mg via ORAL
  Filled 2020-05-11 (×2): qty 2

## 2020-05-11 MED ORDER — ENOXAPARIN SODIUM 40 MG/0.4ML ~~LOC~~ SOLN: 40.0000 mg | SUBCUTANEOUS | Status: DC

## 2020-05-11 MED ORDER — ACETAMINOPHEN 650 MG RE SUPP
650.0000 mg | Freq: Four times a day (QID) | RECTAL | Status: DC | PRN
  Administered 2020-05-23 – 2020-05-31 (×3): 650 mg via RECTAL
  Filled 2020-05-11 (×3): qty 1

## 2020-05-11 NOTE — Progress Notes (Signed)
  Echocardiogram 2D Echocardiogram has been performed.  Evan Chavez 05/11/2020, 1:53 PM

## 2020-05-11 NOTE — ED Notes (Signed)
Pharmacy message to bring Cardizem medication to ED for administration.

## 2020-05-11 NOTE — H&P (Signed)
History and Physical  Evan Chavez EXH:[id number] DOB: 07-31-1961 DOA: 05/10/2020  Referring physician: Varney Biles, MD PCP: Rosita Fire, MD  Patient coming from: Home  Chief Complaint: Shortness of breath  HPI: Evan Chavez is a 59 y.o. male with medical history significant for prior stroke, hyperlipidemia, GERD, BPH and remote history of cocaine abuse who presents to the emergency department due to 5 days of weakness, complains of productive cough with clear phlegm with occasional black speckles with associated reproducible midsternal chest pain. Patient was seen in the ED 2 days ago (7/28) due to similar symptoms, during which he reported a max temperature of 100F.  Chest x-ray done at that time showed pneumonia and patient was treated with IV fluid.  Amoxicillin dicyclomine was prescribed on the was advised to follow-up with his primary care physician.  Patient returned to the ED yesterday due to increased heart rate.  He states that he was taking his blood pressure medication as prescribed and denies cocaine use.  nausea, vomiting,  abdominal pain or having any sick contact.  ED Course:  In the emergency department, he was tachypneic and tachycardic, BP was 157/106.  Work-up in the ED showed leukocytosis, normocytic anemia, hyperglycemia, lactic acid 2.4>1.9, BNP 146.  SARS coronavirus 2 done on 7/28 was negative and repeated test done yesterday was negative as well.  Chest x-ray showed unchanged symmetric bibasilar opacities suspected to be due to combination of pleural effusions and atelectasis/pneumonia.  CT angiography of chest with contrast showed no evidence of pulmonary emboli but showed bilateral pleural effusions and lower lobe consolidation similar to that seen on recent chest x-ray that is consistent with bibasilar pneumonia or reactive effusions.  Review of Systems: Constitutional: Positive for weakness.  Negative for chills and fever.  HENT: Negative for ear  pain and sore throat.   Eyes: Negative for pain and visual disturbance.  Respiratory: Positive for cough and reproducible chest pain.   Cardiovascular: Positive for reproducible chest pain.  Negative for palpitations.  Gastrointestinal: Negative for abdominal pain and vomiting.  Endocrine: Negative for polyphagia and polyuria.  Genitourinary: Negative for decreased urine volume, dysuria Musculoskeletal: Negative for arthralgias and back pain.  Skin: Negative for color change and rash.  Allergic/Immunologic: Negative for immunocompromised state.  Neurological: Negative for tremors, syncope, speech difficulty, weakness, light-headedness and headaches.  Hematological: Does not bruise/bleed easily.  All other systems reviewed and are negative   Past Medical History:  Diagnosis Date  . Cocaine abuse ([place])   . ETOH abuse   . GERD (gastroesophageal reflux disease)   . Hyperlipidemia   . Stroke (cerebrum) ([place]) 06/2018   right sided weakness   Past Surgical History:  Procedure Laterality Date  . BIOPSY  08/25/2018   Procedure: BIOPSY;  Surgeon: [name], MD;  Location: [facility];  Service: Endoscopy;;  . BIOPSY  12/13/2018   Procedure: BIOPSY;  Surgeon: [name], MD;  Location: [facility];  Service: Endoscopy;;  esophagus  . COLONOSCOPY WITH PROPOFOL N/A 04/07/2019   Dr. [name]: 3 Tubular adenomas removed (4 to 8 mm in size), next colonoscopy 3 years.  . ESOPHAGOGASTRODUODENOSCOPY (EGD) WITH PROPOFOL N/A 08/25/2018   Dr. [name]: Severely inflamed abnormal appearing mid/distal esophagus.  Encroachment on lumen suspicious for infiltrating neoplasm located be all from severe benign inflammation, extensive gastric erosions and extensive duodenal ulcerations.  Query ischemic process versus other.  Biopsy revealed Barrett's esophagus but negative for malignancy and H. pylori.  . ESOPHAGOGASTRODUODENOSCOPY (EGD) WITH  PROPOFOL N/A 12/13/2018   Dr. [name]: peptic stricture s/p balloon  dilatation. abnormal esophageal mucosa, by c/w barrett's without dysplasia, previous PUD completely healed.   Marland Kitchen POLYPECTOMY  04/07/2019   Procedure: POLYPECTOMY;  Surgeon: [name], MD;  Location: [facility];  Service: Endoscopy;;  colon    Social History:  reports that he quit smoking about 22 months ago. His smoking use included cigarettes. He has a 40.00 pack-year smoking history. He has never used smokeless tobacco. He reports previous alcohol use of about 12.0 standard drinks of alcohol per week. He reports previous drug use. Drug: Cocaine.   No Known Allergies  Family History  Problem Relation Age of Onset  . Hypertension Father   . Heart attack Father   . Heart disease Father   . Hypertension Mother   . GER disease Mother   . Thyroid disease Mother   . Colon cancer Neg Hx   . Colon polyps Neg Hx     Prior to Admission medications   Medication Sig Start Date End Date Taking? Authorizing Provider  acetaminophen (TYLENOL) 500 MG tablet Take 1,000 mg by mouth every 6 (six) hours as needed (for pain).    [provider]  amantadine (SYMMETREL) 100 MG capsule Take 100 mg by mouth 2 (two) times daily.     [provider]  amoxicillin (AMOXIL) 500 MG capsule Take 2 capsules (1,000 mg total) by mouth 3 (three) times daily. 05/09/20   Fransico Meadow, PA-C  aspirin 325 MG tablet Take 1 tablet (325 mg total) by mouth daily. 07/16/18   Barton Dubois, MD  atorvastatin (LIPITOR) 40 MG tablet Take 40 mg by mouth daily.    [provider]  benzonatate (TESSALON) 100 MG capsule Take 100 mg by mouth 3 (three) times daily. 05/02/20   [provider]  diltiazem (CARDIZEM CD) 120 MG 24 hr capsule Take 1 capsule (120 mg total) by mouth daily. Patient taking differently: Take 120 mg by mouth daily after breakfast.  09/22/18   Soyla Dryer, PA-C  diltiazem (CARDIZEM) 120 MG tablet Take 120 mg by mouth daily. 05/02/20   [provider]    doxycycline (VIBRA-TABS) 100 MG tablet Take 1 tablet (100 mg total) by mouth 2 (two) times daily. 05/09/20   Fransico Meadow, PA-C  Ferrous Sulfate (IRON PO) Take 65 mg by mouth 2 (two) times daily with a meal.    [provider]  loratadine (CLARITIN) 10 MG tablet Take 10 mg by mouth daily. 05/02/20   [provider]  Multiple Vitamin (MULTIVITAMIN WITH MINERALS) TABS tablet Take 1 tablet by mouth daily after breakfast.     [provider]  pantoprazole (PROTONIX) 40 MG tablet Take 1 tablet (40 mg total) by mouth 2 (two) times daily before a meal. 09/22/18   Soyla Dryer, PA-C  sulfamethoxazole-trimethoprim (BACTRIM DS) 800-160 MG tablet Take 1 tablet by mouth 2 (two) times daily. 5 day course prescribed on 05/04/2020 05/04/20   [provider]  tamsulosin (FLOMAX) 0.4 MG CAPS capsule Take 0.4 mg by mouth daily. 05/04/20   [provider]    Physical Exam: BP (!) 143/88   Pulse (!) 120   Temp 99.8 F (37.7 C)   Resp (!) 37   SpO2 93%   . General: 59 y.o. year-old male well developed well nourished in no acute distress.  Alert and oriented x3. Marland Kitchen HEENT: NCAT, EOMI . Neck: Supple, trachea medial . Cardiovascular: Tachycardia.  Regular rate  and rhythm with no rubs or gallops.  No thyromegaly or JVD noted.  No lower extremity edema. 2/4 pulses in all 4 extremities. Marland Kitchen Respiratory: Tachypnea, bilateral mild crackles in lower lobes on auscultation.  No wheezing . Abdomen: Soft nontender nondistended with normal bowel sounds x4 quadrants. . Muskuloskeletal: No cyanosis, clubbing or edema noted bilaterally . Neuro: CN II-XII intact, strength, sensation, reflexes . Skin: No ulcerative lesions noted or rashes . Psychiatry: Judgement and insight appear normal. Mood is appropriate for condition and setting          Labs on Admission:  Basic Metabolic Panel: Recent Labs  Lab 05/09/20 1546 05/10/20 2111  NA 138 136  K 4.0 3.5  CL 97* 97*  CO2 31  29  GLUCOSE 125* 130*  BUN 36* 22*  CREATININE 1.14 1.03  CALCIUM 10.1 9.7   Liver Function Tests: Recent Labs  Lab 05/09/20 1546 05/10/20 2111  AST 14* 17  ALT 20 22  ALKPHOS 60 58  BILITOT 0.3 0.2*  PROT 7.7 7.4  ALBUMIN 4.1 3.8   No results for input(s): LIPASE, AMYLASE in the last 168 hours. No results for input(s): AMMONIA in the last 168 hours. CBC: Recent Labs  Lab 05/09/20 1546 05/10/20 2111  WBC 17.8* 14.6*  NEUTROABS 15.6* 12.2*  HGB 12.8* 10.9*  HCT 41.6 35.8*  MCV 88.9 90.4  PLT 210 199   Cardiac Enzymes: No results for input(s): CKTOTAL, CKMB, CKMBINDEX, TROPONINI in the last 168 hours.  BNP (last 3 results) Recent Labs    05/10/20 2111  BNP 146.0*    ProBNP (last 3 results) No results for input(s): PROBNP in the last 8760 hours.  CBG: No results for input(s): GLUCAP in the last 168 hours.  Radiological Exams on Admission: CT Angio Chest PE W and/or Wo Contrast  Result Date: 05/11/2020 CLINICAL DATA:  Shortness of breath and weakness for several days EXAM: CT ANGIOGRAPHY CHEST WITH CONTRAST TECHNIQUE: Multidetector CT imaging of the chest was performed using the standard protocol during bolus administration of intravenous contrast. Multiplanar CT image reconstructions and MIPs were obtained to evaluate the vascular anatomy. CONTRAST:  30mL OMNIPAQUE IOHEXOL 350 MG/ML SOLN COMPARISON:  Chest x-ray from earlier in the same day, CTA from 07/12/2018. FINDINGS: Cardiovascular: Thoracic aorta demonstrates atherosclerotic calcifications. No aneurysmal dilatation or dissection is noted. No cardiac enlargement is seen. Coronary calcifications are noted. The pulmonary artery shows a normal branching pattern. No definitive filling defect is identified to suggest pulmonary embolism. Mediastinum/Nodes: Thoracic inlet shows a hypodensity within the left lobe of the thyroid measuring 17 mm in greatest dimension. This is relatively stable from a prior CT examination  from 2019. No sizable hilar or mediastinal adenopathy is noted. The esophagus demonstrates some diffuse wall thickening which may be related to reflux esophagitis. Lungs/Pleura: Bilateral moderate pleural effusions are seen. Associated lower lobe consolidation is noted. No sizable parenchymal nodules are seen. No pneumothorax is noted. Upper Abdomen: Visualized upper abdomen shows geographic decreased attenuation within the left kidney and enlargement of the left kidney. This may simply be related to hydronephrosis. The possibility of an infiltrating mass deserves consideration. This can be further evaluated with CT of the abdomen and pelvis with contrast. Musculoskeletal: No acute bony abnormality is noted. Review of the MIP images confirms the above findings. IMPRESSION: No evidence of pulmonary emboli. Bilateral pleural effusions and lower lobe consolidation similar to that seen on recent chest x-ray. This is consistent with bibasilar pneumonia and reactive effusions. Diffuse wall thickening  throughout the esophagus likely related to reflux esophagitis. Clinical correlation is recommended. It should be noted this is stable from a prior exam of 2019. Enlargement of the left kidney with decreased attenuation identified. Although this may simply represent hydronephrosis possibility of an underlying infiltrative mass deserves consideration. CT of the abdomen and pelvis with contrast is recommended. 17 mm left thyroid nodule. This is roughly stable from the prior CT examination from 2019 and likely benign. Recommend thyroid US (ref: J Am Coll Radiol. 2015 Feb;12(2): 143-50). Electronically Signed   By: Inez Catalina M.D.   On: 05/11/2020 00:00   DG Chest Port 1 View  Result Date: 05/10/2020 CLINICAL DATA:  Questionable sepsis.  Pneumonia diagnosis yesterday. EXAM: PORTABLE CHEST 1 VIEW COMPARISON:  Radiograph yesterday. FINDINGS: Upper normal heart size. Unchanged mediastinal contours. Hazy symmetric bibasilar  opacities are unchanged from radiograph yesterday. No pulmonary edema. No pneumothorax. IMPRESSION: Unchanged symmetric bibasilar opacities are unchanged from yesterday, likely combination of pleural effusions and atelectasis/pneumonia. Electronically Signed   By: Keith Rake M.D.   On: 05/10/2020 21:24   DG Chest Port 1 View  Result Date: 05/09/2020 CLINICAL DATA:  Cough and fever. EXAM: PORTABLE CHEST 1 VIEW COMPARISON:  09/06/2018 FINDINGS: Heart size is normal. There are bilateral pleural effusions and infiltrate/atelectasis in both lower lungs, consistent with pneumonia. Upper lungs are clear. IMPRESSION: Bilateral lower lobe atelectasis and pneumonia.  Small effusions. Electronically Signed   By: Nelson Chimes M.D.   On: 05/09/2020 14:16    EKG: I independently viewed the EKG done and my findings are as followed: Initial EKG showed atrial flutter at rate of 125 bpm and T wave inversion in V5-V6 Subsequent EKG showed sinus tachycardia at rate of 119 bpm with T wave inversion in V5-V6  Assessment/Plan Present on Admission: . Shortness of breath . Essential hypertension . Atrial tachycardia ([place]) . Dyslipidemia . Gastroesophageal reflux disease with esophagitis  Active Problems:   Essential hypertension   Atrial tachycardia ([facility])   Dyslipidemia   Gastroesophageal reflux disease with esophagitis   Shortness of breath   Thyroid nodule   BPH (benign prostatic hyperplasia)  Shortness of breath secondary to bilateral pleural effusion with concomitant bibasilar pneumonia (CAP) POA Chest x-ray showed unchanged symmetric bibasilar opacities suspected to be due to combination of pleural effusions and atelectasis/pneumonia.   CT angiography of chest with contrast showed no evidence of pulmonary emboli but showed bilateral pleural effusions and lower lobe consolidation similar to that seen on recent chest x-ray that is consistent with bibasilar pneumonia or reactive effusions. PORT/PSI of  109 points  indicating 8.2-9.3% mortality He was started on IV ceftriaxone and azithromycin, we shall continue with same at this time with plan to de-escalate/escalate based on procalcitonin, blood culture and sputum culture Continue Mucinex, incentive spirometry, flutter valve  Blood culture and sputum culture pending IR will be consulted for thoracentesis in the morning Pleural fluid culture pending  Severe sepsis secondary to above Patient was tachycardic, tachypneic and presents with leukocytosis (meets SIRS criteria) and imaging studies showing pneumonia.  Patient also had lactic acidosis (which has now resolved), though patient does not appear septic clinically Continue treatment as per above  Sinus tachycardia This may be due to patient's bilateral pleural effusion IR consulted for thoracentesis in the morning.  GERD Continue Protonix  Hyperlipidemia Continue Lipitor  Incidental finding of thyroid nodule by CT neck of your chest with contrast 17 mm left thyroid nodule noted and this is probably stable from prostate examination  from 2019 and likely benign  Incidental finding of left kidney enlargement with decreased attenuation CT angiography of chest showed enlargement of left kidney with decreased attenuation which may simply represent hydronephrosis, but due to possibility of an underlying infiltrative mass, CT of the abdomen pelvis with contrast is recommended. Consider CT of abdomen and pelvis prior to discharge.  BPH Continue Flomax   DVT prophylaxis: SCDs (no indication for chemoprophylaxis at this time due to possible thoracentesis in the morning)  Code Status: Full code  Family Communication: None at bedside  Disposition Plan:  Patient is from:                        home Anticipated DC to:                   SNF or family members home Anticipated DC date:               2-3 days Anticipated DC barriers:         Unstable to discharge at this time due to  bilateral pleural effusion with CAP POA and impending possible thoracentesis   Consults called: None  Admission status: Inpatient    Bernadette Hoit MD [facility]  If 7PM-7AM, please contact night-coverage [email or web address]  05/11/2020, 3:16 AM

## 2020-05-11 NOTE — ED Notes (Signed)
Echo tech at bedside. PT will be transported upstairs at completion of test.

## 2020-05-11 NOTE — Progress Notes (Signed)
This is a pleasant 59 year old gentleman with history of prior stroke, hyperlipidemia, BPH, GERD and remote history of cocaine abuse who presented to ED with complaint of 5 days of weakness and some productive cough.  Was seen in ED 2 days ago and was discharged home on oral antibiotics after diagnosed with ammonia.  Return to ED due to persistent symptoms and increased heart rate.  He was tachypneic and tachycardic.  Blood pressure was slightly elevated.  Once again leukocytosis.  Chest x-ray shows bilateral pleural effusion and bibasilar opacities.  CT angiogram ruled out PE but confirmed mild bilateral pleural effusions with lower lobe consolidation.  He was diagnosed with sepsis secondary to community-acquired pneumonia.  He was started on IV Rocephin and Zithromax.  Patient seen and examined while he was still in the ED this morning.  Patient is not on oxygen.  He did not require any oxygen since admission.  Patient despite of having tachycardia and documented tachypnea, did not have any complaint and he looked very comfortable.  He was not tachypneic when I saw him but for some reason, chart continues to document tachypnea.  On examination, he had left lower lobe very mild rhonchi.  He had intermittent expiratory wheezes.  Does not have any history of COPD.  He is an ex-smoker.  IR was consulted for thoracentesis however I received a call from radiologist that patient has very minimal bilateral pleural effusion.  Due to the fact that patient is comfortable and not requiring any oxygen, we canceled thoracentesis.  His BNP was slightly elevated.  Last known echo was in October 2019 which had normal ejection fraction.  Will check echocardiogram.  We will give him 1 dose of IV Lasix 40 mg and reassess tomorrow.

## 2020-05-11 NOTE — Progress Notes (Signed)
  Echocardiogram 2D Echocardiogram has been performed.  Evan Chavez 05/11/2020, 1:55 PM

## 2020-05-12 DIAGNOSIS — J181 Lobar pneumonia, unspecified organism: Secondary | ICD-10-CM

## 2020-05-12 DIAGNOSIS — A419 Sepsis, unspecified organism: Principal | ICD-10-CM

## 2020-05-12 LAB — CBC
HCT: 25.4 % — ABNORMAL LOW (ref 39.0–52.0)
Hemoglobin: 8 g/dL — ABNORMAL LOW (ref 13.0–17.0)
MCH: 28.3 pg (ref 26.0–34.0)
MCHC: 31.5 g/dL (ref 30.0–36.0)
MCV: 89.8 fL (ref 80.0–100.0)
Platelets: 179 10*3/uL (ref 150–400)
RBC: 2.83 MIL/uL — ABNORMAL LOW (ref 4.22–5.81)
RDW: 12.9 % (ref 11.5–15.5)
WBC: 6.8 10*3/uL (ref 4.0–10.5)
nRBC: 0 % (ref 0.0–0.2)

## 2020-05-12 LAB — LEGIONELLA PNEUMOPHILA SEROGP 1 UR AG: L. pneumophila Serogp 1 Ur Ag: NEGATIVE

## 2020-05-12 LAB — BASIC METABOLIC PANEL
Anion gap: 8 (ref 5–15)
BUN: 14 mg/dL (ref 6–20)
CO2: 23 mmol/L (ref 22–32)
Calcium: 8.3 mg/dL — ABNORMAL LOW (ref 8.9–10.3)
Chloride: 101 mmol/L (ref 98–111)
Creatinine, Ser: 0.91 mg/dL (ref 0.61–1.24)
GFR calc Af Amer: >60 mL/min (ref >60)
GFR calc non Af Amer: >60 mL/min (ref >60)
Glucose, Bld: 149 mg/dL — ABNORMAL HIGH (ref 70–99)
Potassium: 3.3 mmol/L — ABNORMAL LOW (ref 3.5–5.1)
Sodium: 132 mmol/L — ABNORMAL LOW (ref 135–145)

## 2020-05-12 LAB — URINE CULTURE: Culture: NO GROWTH

## 2020-05-12 NOTE — Progress Notes (Signed)
PROGRESS NOTE  Evan Chavez LPF:[id number] DOB: 1961-07-30 DOA: 05/10/2020 PCP: Rosita Fire, MD  Brief History:  59 y.o. male with medical history significant for prior stroke, hyperlipidemia, GERD, BPH, barrett's esophagus  and remote history of cocaine abuse who presents to the emergency department due to 5 days of weakness, complains of productive cough with clear phlegm with occasional black speckles with associated reproducible midsternal chest pain. Patient was seen in the ED 2 days ago (7/28) due to similar symptoms, during which he reported a max temperature of 100F.  Chest x-ray done at that time showed pneumonia and patient was treated with IV fluid.  Amoxicillin and doxycycline were prescribed on the was advised to follow-up with his primary care physician.  Patient returned to the ED yesterday due to increased heart rate and sob.  He states that he was taking his blood pressure medication as prescribed and denies cocaine use.    ED Course:  In the emergency department, he was tachypneic and tachycardic, BP was 157/106.  Work-up in the ED showed leukocytosis, normocytic anemia, hyperglycemia, lactic acid 2.4>1.9, BNP 146.  SARS coronavirus 2 done on 7/28 was negative and repeated test done yesterday was negative as well.  Chest x-ray showed unchanged symmetric bibasilar opacities suspected to be due to combination of pleural effusions and atelectasis/pneumonia.  CT angiography of chest with contrast showed no evidence of pulmonary emboli but showed bilateral pleural effusions and lower lobe consolidation similar to that seen on recent chest x-ray that is consistent with bibasilar pneumonia or reactive effusions.  Assessment/Plan: Sepsis -present on admission -presented with leukocytosis, elevated lactate, tachycardia and radiographic evidence of pneumonia -due to pneumonia -lactate peaked 2.4 -blood cultures remain neg -UA--no pyuria  Lobar Pneumonia -7/29 CTA  chest--no PE.  Bilateral LL consolidation with pleural effusions -urine legionella and s.pneumoniae antigens neg -continue empiric ceftriaxone and azithromycin  Parapneumonic effusions -05/11/20 echo--EF 65-70%, G1DD, trivial MR -now stable on RA  Barrett's esophagus -reflux esophagitis hx which is contributing to esophageal wall thickening -follows [facility] -increase protonix to bid -Repeat EGD in March 2020: Peptic stricture status post balloon dilatation. Abnormal esophageal mucosa biopsy, completely healed previously noted peptic ulcer disease, hiatal hernia. Biopsies with Barrett's esophagus without dysplasia or malignancy   Incidental finding of left kidney enlargement with decreased attenuation -CT angiography of chest showed enlargement of left kidney with decreased attenuation which may simply represent hydronephrosis, but due to possibility of an underlying infiltrative mass, CT of the abdomen pelvis with contrast is recommended. -CT of abdomen and pelvis with IV contrast if renal function stable on repeat BMP  Update EGD in 03/2022 for Barrett's esophagus.  Will be due colonoscopy at the same time for tubular adenomas  Hyperlipidemia Continue Lipitor  Incidental finding of thyroid nodule by CT neck of your chest with contrast 17 mm left thyroid nodule noted and this is probably stable from prior examination from 2019 and likely benign     Status is: Inpatient  Remains inpatient appropriate because:IV treatments appropriate due to intensity of illness or inability to take PO   Dispo: The patient is from: Home              Anticipated d/c is to: Home              Anticipated d/c date is: 1 day              Patient currently is not medically  stable to d/c.        Family Communication:   Family at bedside  Consultants:    Code Status:  FULL / DNR  DVT Prophylaxis:  [place] Heparin / [place] Lovenox   Procedures: As Listed in Progress Note  Above  Antibiotics: Ceftriaxone/azithro 7/29>>>     Subjective: Patient denies fevers, chills, headache, chest pain, dyspnea, nausea, vomiting, diarrhea, abdominal pain, dysuria, hematuria, hematochezia, and melena.   Objective: Vitals:   05/11/20 1412 05/11/20 2135 05/12/20 0424 05/12/20 1454  BP: (!) 138/89 (!) 142/84 (!) 129/76 (!) 130/71  Pulse:  97 (!) 108 97  Resp: 17 18 20 16   Temp: 98.9 F (37.2 C) 100.1 F (37.8 C) 99.6 F (37.6 C) 98.6 F (37 C)  TempSrc:  Oral Oral Oral  SpO2:  98% 95% 98%    Intake/Output Summary (Last 24 hours) at 05/12/2020 1649 Last data filed at 05/12/2020 1637 Gross per 24 hour  Intake 600 ml  Output 2000 ml  Net -1400 ml   Weight change:  Exam:   General:  Pt is alert, follows commands appropriately, not in acute distress  HEENT: No icterus, No thrush, No neck mass, [place]/AT  Cardiovascular: RRR, S1/S2, no rubs, no gallops  Respiratory: bibasilar rales. No wheeze  Abdomen: Soft/+BS, non tender, non distended, no guarding  Extremities: No edema, No lymphangitis, No petechiae, No rashes, no synovitis   Data Reviewed: I have personally reviewed following labs and imaging studies Basic Metabolic Panel: Recent Labs  Lab 05/09/20 1546 05/10/20 2111 05/11/20 0536  NA 138 136 135  K 4.0 3.5 3.8  CL 97* 97* 103  CO2 31 29 23   GLUCOSE 125* 130* 118*  BUN 36* 22* 18  CREATININE 1.14 1.03 0.93  CALCIUM 10.1 9.7 8.6*  MG  --   --  1.7  PHOS  --   --  2.5   Liver Function Tests: Recent Labs  Lab 05/09/20 1546 05/10/20 2111 05/11/20 0536  AST 14* 17 21  ALT 20 22 25   ALKPHOS 60 58 51  BILITOT 0.3 0.2* 0.4  PROT 7.7 7.4 6.5  ALBUMIN 4.1 3.8 3.2*   No results for input(s): LIPASE, AMYLASE in the last 168 hours. No results for input(s): AMMONIA in the last 168 hours. Coagulation Profile: Recent Labs  Lab 05/10/20 2111 05/11/20 0536  INR 1.1 1.1   CBC: Recent Labs  Lab 05/09/20 1546 05/10/20 2111 05/11/20 0536   WBC 17.8* 14.6* 12.9*  NEUTROABS 15.6* 12.2*  --   HGB 12.8* 10.9* 9.5*  HCT 41.6 35.8* 31.8*  MCV 88.9 90.4 92.2  PLT 210 199 159   Cardiac Enzymes: No results for input(s): CKTOTAL, CKMB, CKMBINDEX, TROPONINI in the last 168 hours. BNP: Invalid input(s): POCBNP CBG: No results for input(s): GLUCAP in the last 168 hours. HbA1C: No results for input(s): HGBA1C in the last 72 hours. Urine analysis:    Component Value Date/Time   COLORURINE YELLOW 05/11/2020 0910   APPEARANCEUR CLEAR 05/11/2020 0910   LABSPEC 1.035 (H) 05/11/2020 0910   PHURINE 7.0 05/11/2020 0910   GLUCOSEU NEGATIVE 05/11/2020 0910   HGBUR NEGATIVE 05/11/2020 0910   BILIRUBINUR NEGATIVE 05/11/2020 0910   BILIRUBINUR NEG 09/22/2018 1527   KETONESUR NEGATIVE 05/11/2020 0910   PROTEINUR 30 (A) 05/11/2020 0910   UROBILINOGEN 0.2 09/22/2018 1527   UROBILINOGEN 0.2 03/03/2012 0205   NITRITE NEGATIVE 05/11/2020 0910   LEUKOCYTESUR NEGATIVE 05/11/2020 0910   Sepsis Labs: @LABRCNTIP (procalcitonin:4,lacticidven:4) ) Recent Results (from the past 240  hour(s))  SARS Coronavirus 2 by RT PCR (hospital order, performed in [facility] hospital lab) Nasopharyngeal Nasopharyngeal Swab     Status: None   Collection Time: 05/09/20  4:08 PM   Specimen: Nasopharyngeal Swab  Result Value Ref Range Status   SARS Coronavirus 2 NEGATIVE NEGATIVE Final    Comment: (NOTE) SARS-CoV-2 target nucleic acids are NOT DETECTED.  The SARS-CoV-2 RNA is generally detectable in upper and lower respiratory specimens during the acute phase of infection. The lowest concentration of SARS-CoV-2 viral copies this assay can detect is 250 copies / mL. A negative result does not preclude SARS-CoV-2 infection and should not be used as the sole basis for treatment or other patient management decisions.  A negative result may occur with improper specimen collection / handling, submission of specimen other than nasopharyngeal swab, presence of  viral mutation(s) within the areas targeted by this assay, and inadequate number of viral copies (<250 copies / mL). A negative result must be combined with clinical observations, patient history, and epidemiological information.  Fact Sheet for Patients:   StrictlyIdeas.no  Fact Sheet for Healthcare Providers: BankingDealers.co.za  This test is not yet approved or  cleared by the [place] FDA and has been authorized for detection and/or diagnosis of SARS-CoV-2 by FDA under an Emergency Use Authorization (EUA).  This EUA will remain in effect (meaning this test can be used) for the duration of the COVID-19 declaration under Section 564(b)(1) of the Act, 21 U.S.C. section 360bbb-3(b)(1), unless the authorization is terminated or revoked sooner.  Performed at [facility], [address], [place], [place] [address]   Blood Culture (routine x 2)     Status: None (Preliminary result)   Collection Time: 05/10/20  9:11 PM   Specimen: BLOOD LEFT ARM  Result Value Ref Range Status   Specimen Description BLOOD LEFT ARM  Final   Special Requests   Final    BOTTLES DRAWN AEROBIC AND ANAEROBIC Blood Culture adequate volume   Culture   Final    NO GROWTH 2 DAYS Performed at [facility], [address] [place], [place] [address]    Report Status PENDING  Incomplete  Blood Culture (routine x 2)     Status: None (Preliminary result)   Collection Time: 05/10/20 10:32 PM   Specimen: BLOOD  Result Value Ref Range Status   Specimen Description BLOOD LEFT ANTECUBITAL  Final   Special Requests   Final    BOTTLES DRAWN AEROBIC AND ANAEROBIC Blood Culture adequate volume   Culture   Final    NO GROWTH 2 DAYS Performed at [facility], [address], [place],  [address]    Report Status PENDING  Incomplete  SARS Coronavirus 2 by RT PCR (hospital order, performed in [facility] hospital lab) Nasopharyngeal Nasopharyngeal Swab     Status:  None   Collection Time: 05/10/20 11:58 PM   Specimen: Nasopharyngeal Swab  Result Value Ref Range Status   SARS Coronavirus 2 NEGATIVE NEGATIVE Final    Comment: (NOTE) SARS-CoV-2 target nucleic acids are NOT DETECTED.  The SARS-CoV-2 RNA is generally detectable in upper and lower respiratory specimens during the acute phase of infection. The lowest concentration of SARS-CoV-2 viral copies this assay can detect is 250 copies / mL. A negative result does not preclude SARS-CoV-2 infection and should not be used as the sole basis for treatment or other patient management decisions.  A negative result may occur with improper specimen collection / handling, submission of specimen other  than nasopharyngeal swab, presence of viral mutation(s) within the areas targeted by this assay, and inadequate number of viral copies (<250 copies / mL). A negative result must be combined with clinical observations, patient history, and epidemiological information.  Fact Sheet for Patients:   StrictlyIdeas.no  Fact Sheet for Healthcare Providers: BankingDealers.co.za  This test is not yet approved or  cleared by the [place] FDA and has been authorized for detection and/or diagnosis of SARS-CoV-2 by FDA under an Emergency Use Authorization (EUA).  This EUA will remain in effect (meaning this test can be used) for the duration of the COVID-19 declaration under Section 564(b)(1) of the Act, 21 U.S.C. section 360bbb-3(b)(1), unless the authorization is terminated or revoked sooner.  Performed at [facility], [address], [place], [place] [address]   Urine culture     Status: None   Collection Time: 05/11/20  9:10 AM   Specimen: Urine, Clean Catch  Result Value Ref Range Status   Specimen Description   Final    URINE, CLEAN CATCH Performed at [facility], [address], [place], [place] [address]    Special Requests   Final    NONE Performed  at [facility], [address], [place], [place] [address]    Culture   Final    NO GROWTH Performed at [facility], [employer] [address], [place], [place] [address]    Report Status 05/12/2020 FINAL  Final     Scheduled Meds: . amantadine  100 mg Oral BID  . aspirin  325 mg Oral Daily  . atorvastatin  40 mg Oral Daily  . dextromethorphan-guaiFENesin  1 tablet Oral BID  . diltiazem  120 mg Oral QPC breakfast  . pantoprazole  40 mg Oral BID AC  . tamsulosin  0.4 mg Oral Daily   Continuous Infusions: . azithromycin 500 mg (05/11/20 2156)  . cefTRIAXone (ROCEPHIN)  IV 2 g (05/11/20 2107)    Procedures/Studies: CT Angio Chest PE W and/or Wo Contrast  Result Date: 05/11/2020 CLINICAL DATA:  Shortness of breath and weakness for several days EXAM: CT ANGIOGRAPHY CHEST WITH CONTRAST TECHNIQUE: Multidetector CT imaging of the chest was performed using the standard protocol during bolus administration of intravenous contrast. Multiplanar CT image reconstructions and MIPs were obtained to evaluate the vascular anatomy. CONTRAST:  28mL OMNIPAQUE IOHEXOL 350 MG/ML SOLN COMPARISON:  Chest x-ray from earlier in the same day, CTA from 07/12/2018. FINDINGS: Cardiovascular: Thoracic aorta demonstrates atherosclerotic calcifications. No aneurysmal dilatation or dissection is noted. No cardiac enlargement is seen. Coronary calcifications are noted. The pulmonary artery shows a normal branching pattern. No definitive filling defect is identified to suggest pulmonary embolism. Mediastinum/Nodes: Thoracic inlet shows a hypodensity within the left lobe of the thyroid measuring 17 mm in greatest dimension. This is relatively stable from a prior CT examination from 2019. No sizable hilar or mediastinal adenopathy is noted. The esophagus demonstrates some diffuse wall thickening which may be related to reflux esophagitis. Lungs/Pleura: Bilateral moderate pleural effusions are seen. Associated lower lobe  consolidation is noted. No sizable parenchymal nodules are seen. No pneumothorax is noted. Upper Abdomen: Visualized upper abdomen shows geographic decreased attenuation within the left kidney and enlargement of the left kidney. This may simply be related to hydronephrosis. The possibility of an infiltrating mass deserves consideration. This can be further evaluated with CT of the abdomen and pelvis with contrast. Musculoskeletal: No acute bony abnormality is noted. Review of the MIP images confirms the above findings. IMPRESSION: No  evidence of pulmonary emboli. Bilateral pleural effusions and lower lobe consolidation similar to that seen on recent chest x-ray. This is consistent with bibasilar pneumonia and reactive effusions. Diffuse wall thickening throughout the esophagus likely related to reflux esophagitis. Clinical correlation is recommended. It should be noted this is stable from a prior exam of 2019. Enlargement of the left kidney with decreased attenuation identified. Although this may simply represent hydronephrosis possibility of an underlying infiltrative mass deserves consideration. CT of the abdomen and pelvis with contrast is recommended. 17 mm left thyroid nodule. This is roughly stable from the prior CT examination from 2019 and likely benign. Recommend thyroid US (ref: J Am Coll Radiol. 2015 Feb;12(2): 143-50). Electronically Signed   By: Inez Catalina M.D.   On: 05/11/2020 00:00   DG Chest Port 1 View  Result Date: 05/10/2020 CLINICAL DATA:  Questionable sepsis.  Pneumonia diagnosis yesterday. EXAM: PORTABLE CHEST 1 VIEW COMPARISON:  Radiograph yesterday. FINDINGS: Upper normal heart size. Unchanged mediastinal contours. Hazy symmetric bibasilar opacities are unchanged from radiograph yesterday. No pulmonary edema. No pneumothorax. IMPRESSION: Unchanged symmetric bibasilar opacities are unchanged from yesterday, likely combination of pleural effusions and atelectasis/pneumonia. Electronically  Signed   By: Keith Rake M.D.   On: 05/10/2020 21:24   DG Chest Port 1 View  Result Date: 05/09/2020 CLINICAL DATA:  Cough and fever. EXAM: PORTABLE CHEST 1 VIEW COMPARISON:  09/06/2018 FINDINGS: Heart size is normal. There are bilateral pleural effusions and infiltrate/atelectasis in both lower lungs, consistent with pneumonia. Upper lungs are clear. IMPRESSION: Bilateral lower lobe atelectasis and pneumonia.  Small effusions. Electronically Signed   By: Nelson Chimes M.D.   On: 05/09/2020 14:16   ECHOCARDIOGRAM COMPLETE  Result Date: 05/11/2020    ECHOCARDIOGRAM REPORT   Patient Name:   Evan Chavez Date of Exam: 05/11/2020 Medical Rec #:  [id number]           Height:       69.0 in Accession #:    [id number]          Weight:       153.0 lb Date of Birth:  01/05/1961           BSA:          1.844 m Patient Age:    36 years            BP:           141/90 mmHg Patient Gender: M                   HR:           100 bpm. Exam Location:  [facility] Procedure: 2D Echo, Cardiac Doppler and Color Doppler Indications:    CHF  History:        Patient has prior history of Echocardiogram examinations, most                 recent 07/13/2018. CHF, Stroke, Signs/Symptoms:Shortness of                 Breath; Risk Factors:Hypertension and Dyslipidemia. Cocaine                 abuse, Pneumonia.  Sonographer:    Dustin Flock RDCS Referring Phys: [id number] [place]  1. Left ventricular ejection fraction, by estimation, is 65 to 70%. The left ventricle has normal function. The left ventricle has no regional wall motion abnormalities. Left ventricular diastolic parameters are consistent with Grade I  diastolic dysfunction (impaired relaxation).  2. Right ventricular systolic function is normal. The right ventricular size is normal. Tricuspid regurgitation signal is inadequate for assessing PA pressure.  3. The mitral valve is grossly normal. Trivial mitral valve regurgitation.  4. The aortic valve is  tricuspid. Aortic valve regurgitation is not visualized.  5. The inferior vena cava is normal in size with greater than 50% respiratory variability, suggesting right atrial pressure of 3 mmHg. FINDINGS  Left Ventricle: Left ventricular ejection fraction, by estimation, is 65 to 70%. The left ventricle has normal function. The left ventricle has no regional wall motion abnormalities. The left ventricular internal cavity size was normal in size. There is  no left ventricular hypertrophy. Left ventricular diastolic parameters are consistent with Grade I diastolic dysfunction (impaired relaxation). Right Ventricle: The right ventricular size is normal. No increase in right ventricular wall thickness. Right ventricular systolic function is normal. Tricuspid regurgitation signal is inadequate for assessing PA pressure. Left Atrium: Left atrial size was normal in size. Right Atrium: Right atrial size was normal in size. Pericardium: There is no evidence of pericardial effusion. Mitral Valve: The mitral valve is grossly normal. Trivial mitral valve regurgitation. Tricuspid Valve: The tricuspid valve is grossly normal. Tricuspid valve regurgitation is mild. Aortic Valve: The aortic valve is tricuspid. Aortic valve regurgitation is not visualized. Pulmonic Valve: The pulmonic valve was grossly normal. Pulmonic valve regurgitation is trivial. Aorta: The aortic root is normal in size and structure. Venous: The inferior vena cava is normal in size with greater than 50% respiratory variability, suggesting right atrial pressure of 3 mmHg. IAS/Shunts: No atrial level shunt detected by color flow Doppler.  LEFT VENTRICLE PLAX 2D LVIDd:         4.30 cm  Diastology LVIDs:         2.68 cm  LV e' lateral:   6.42 cm/s LV PW:         0.82 cm  LV E/e' lateral: 11.1 LV IVS:        0.98 cm  LV e' medial:    5.11 cm/s LVOT diam:     2.20 cm  LV E/e' medial:  13.9 LV SV:         81 LV SV Index:   44 LVOT Area:     3.80 cm  RIGHT VENTRICLE RV  Basal diam:  2.99 cm RV S prime:     21.60 cm/s TAPSE (M-mode): 3.1 cm LEFT ATRIUM             Index       RIGHT ATRIUM           Index LA diam:        3.50 cm 1.90 cm/m  RA Area:     14.20 cm LA Vol (A2C):   42.8 ml 23.21 ml/m RA Volume:   31.20 ml  16.92 ml/m LA Vol (A4C):   46.7 ml 25.33 ml/m LA Biplane Vol: 45.6 ml 24.73 ml/m  AORTIC VALVE LVOT Vmax:   124.00 cm/s LVOT Vmean:  72.900 cm/s LVOT VTI:    0.212 m  AORTA Ao Root diam: 3.00 cm MITRAL VALVE MV Area (PHT): 6.65 cm     SHUNTS MV Decel Time: 114 msec     Systemic VTI:  0.21 m MV E velocity: 71.10 cm/s   Systemic Diam: 2.20 cm MV A velocity: 113.00 cm/s MV E/A ratio:  0.63 Rozann Lesches MD Electronically signed by Rozann Lesches MD Signature Date/Time: 05/11/2020/4:35:39 PM  Final     Orson Eva, DO  [facility]  If 7PM-7AM, please contact night-coverage [email or web address] Password [email or web address] 05/12/2020, 4:49 PM   LOS: 1 day

## 2020-05-13 ENCOUNTER — Inpatient Hospital Stay (HOSPITAL_COMMUNITY): Payer: Medicaid Other

## 2020-05-13 ENCOUNTER — Encounter (HOSPITAL_COMMUNITY): Payer: Self-pay | Admitting: Internal Medicine

## 2020-05-13 DIAGNOSIS — N132 Hydronephrosis with renal and ureteral calculous obstruction: Secondary | ICD-10-CM | POA: Diagnosis present

## 2020-05-13 DIAGNOSIS — R1032 Left lower quadrant pain: Secondary | ICD-10-CM

## 2020-05-13 LAB — BASIC METABOLIC PANEL
Anion gap: 11 (ref 5–15)
BUN: 10 mg/dL (ref 6–20)
CO2: 23 mmol/L (ref 22–32)
Calcium: 8.6 mg/dL — ABNORMAL LOW (ref 8.9–10.3)
Chloride: 103 mmol/L (ref 98–111)
Creatinine, Ser: 0.84 mg/dL (ref 0.61–1.24)
GFR calc Af Amer: >60 mL/min (ref >60)
GFR calc non Af Amer: >60 mL/min (ref >60)
Glucose, Bld: 111 mg/dL — ABNORMAL HIGH (ref 70–99)
Potassium: 3.5 mmol/L (ref 3.5–5.1)
Sodium: 137 mmol/L (ref 135–145)

## 2020-05-13 LAB — CBC
HCT: 26.4 % — ABNORMAL LOW (ref 39.0–52.0)
Hemoglobin: 8 g/dL — ABNORMAL LOW (ref 13.0–17.0)
MCH: 26.9 pg (ref 26.0–34.0)
MCHC: 30.3 g/dL (ref 30.0–36.0)
MCV: 88.9 fL (ref 80.0–100.0)
Platelets: 195 10*3/uL (ref 150–400)
RBC: 2.97 MIL/uL — ABNORMAL LOW (ref 4.22–5.81)
RDW: 12.8 % (ref 11.5–15.5)
WBC: 7.5 10*3/uL (ref 4.0–10.5)
nRBC: 0 % (ref 0.0–0.2)

## 2020-05-13 LAB — PROCALCITONIN: Procalcitonin: 0.22 ng/mL

## 2020-05-13 MED ORDER — IOHEXOL 300 MG/ML  SOLN
100.0000 mL | Freq: Once | INTRAMUSCULAR | Status: AC | PRN
  Administered 2020-05-13: 100 mL via INTRAVENOUS

## 2020-05-13 MED ORDER — TRAMADOL HCL 50 MG PO TABS
50.0000 mg | ORAL_TABLET | Freq: Four times a day (QID) | ORAL | Status: DC | PRN
  Administered 2020-05-13 – 2020-05-16 (×5): 50 mg via ORAL
  Filled 2020-05-13 (×5): qty 1

## 2020-05-13 MED ORDER — ONDANSETRON HCL 4 MG/2ML IJ SOLN
4.0000 mg | Freq: Four times a day (QID) | INTRAMUSCULAR | Status: DC | PRN
  Administered 2020-05-13: 4 mg via INTRAVENOUS
  Filled 2020-05-13 (×2): qty 2

## 2020-05-13 MED ORDER — IOHEXOL 9 MG/ML PO SOLN
ORAL | Status: AC
  Administered 2020-05-13: 1000 mL
  Filled 2020-05-13: qty 1000

## 2020-05-13 MED ORDER — AZITHROMYCIN 250 MG PO TABS
500.0000 mg | ORAL_TABLET | Freq: Every day | ORAL | Status: AC
  Administered 2020-05-13 – 2020-05-14 (×2): 500 mg via ORAL
  Filled 2020-05-13 (×2): qty 2

## 2020-05-13 MED ORDER — IPRATROPIUM-ALBUTEROL 0.5-2.5 (3) MG/3ML IN SOLN
3.0000 mL | Freq: Four times a day (QID) | RESPIRATORY_TRACT | Status: DC
  Administered 2020-05-13 – 2020-05-17 (×15): 3 mL via RESPIRATORY_TRACT
  Filled 2020-05-13 (×15): qty 3

## 2020-05-13 MED ORDER — FUROSEMIDE 10 MG/ML IJ SOLN
20.0000 mg | Freq: Once | INTRAMUSCULAR | Status: AC
  Administered 2020-05-13: 20 mg via INTRAVENOUS
  Filled 2020-05-13: qty 2

## 2020-05-13 NOTE — Consult Note (Signed)
Consulted regarding patient's 68mm left proximal stone.  He is admitted with PNA, treated since 7/29, now on RA.  While in the hospital developed left flank pain.  CT scan shows a stone.  Having on-going renal colic symptoms but the pain is controlled on oral medications.  Called into patient's room and spoke with him regarding stone.  We discussed the fact that the stone was likely to big to pass. We also discussed placing a stent this afternoon at [facility] and then following up in a week or two for removal of the stone.  Then we discussed ESWL.  After explaining each option with the patient he has opted to take the pain medication as needed and f/u with [place] in Clinic this Tuesday to set-up ESWL.  He will need to stop his ASA. I relayed this information to Dr. Carles Collet, who was okay with this plan.  Patient likely being discharged from the hospital tomorrow.

## 2020-05-13 NOTE — Consult Note (Signed)
I have been asked to see the patient by Dr. Shanon Brow Tat, for evaluation and management of large left sided ureteral stone.  History of present illness: 59 year old male who was admitted to the hospital on 7/29 with bilateral pneumonia.  He had been doing reasonably well and able to maintain sats on room air with no significant fever or complication.  However, yesterday, 7/31, he developed acute onset left-sided abdominal pain.  A CT scan of his chest for evaluation of the pneumonia was performed and the patient was noted to have left-sided kidney abnormality.  He subsequently underwent an abdomen pelvis CT scan that demonstrated a 9 mm obstructing proximal left ureteral stone.  The patient's pain is being well controlled on oral pain medication, currently 5/10.  He is having associated nausea, but no vomiting.  He had been breathing comfortably, but as of lately he started to have increased work of breathing, despite being on room air.  The patient has no history of kidney stones.  He is voiding without difficulty.  He denies any dysuria.  A urine analysis performed in the emergency department demonstrated no concerns for infections.  Review of systems: A 12 point comprehensive review of systems was obtained and is negative unless otherwise stated in the history of present illness.  Patient Active Problem List   Diagnosis Date Noted  . Ureteral stone with hydronephrosis 05/13/2020  . Shortness of breath 05/11/2020  . Thyroid nodule 05/11/2020  . BPH (benign prostatic hyperplasia) 05/11/2020  . Bilateral pleural effusion 05/11/2020  . Barrett's esophagus 06/15/2019  . Peptic stricture of esophagus 03/30/2019  . Anemia 03/30/2019  . Abnormality of esophagus   . Gastritis and gastroduodenitis   . Gastroesophageal reflux disease with esophagitis   . Lobar pneumonia ([place]) 08/22/2018  . Sepsis due to undetermined organism ([place]) 08/22/2018  . Fever   . Precordial pain   . CAP (community acquired  pneumonia) 08/21/2018  . Elevated transaminase level 08/21/2018  . Atrial tachycardia ([place]) 07/13/2018  . Dyslipidemia 07/13/2018  . Acute ischemic stroke ([place]) 07/12/2018  . Essential hypertension 07/12/2018  . Tobacco abuse 07/12/2018  . Cocaine abuse ([place]) 07/12/2018  . Ischemic stroke ([place])     No current facility-administered medications on file prior to encounter.   Current Outpatient Medications on File Prior to Encounter  Medication Sig Dispense Refill  . acetaminophen (TYLENOL) 500 MG tablet Take 1,000 mg by mouth every 6 (six) hours as needed (for pain).    Marland Kitchen amantadine (SYMMETREL) 100 MG capsule Take 100 mg by mouth 2 (two) times daily.     Marland Kitchen amoxicillin (AMOXIL) 500 MG capsule Take 2 capsules (1,000 mg total) by mouth 3 (three) times daily. 30 capsule 0  . aspirin 325 MG tablet Take 1 tablet (325 mg total) by mouth daily.    Marland Kitchen atorvastatin (LIPITOR) 40 MG tablet Take 40 mg by mouth daily.    . benzonatate (TESSALON) 100 MG capsule Take 100 mg by mouth 3 (three) times daily.    Marland Kitchen diltiazem (CARDIZEM CD) 120 MG 24 hr capsule Take 1 capsule (120 mg total) by mouth daily. (Patient taking differently: Take 120 mg by mouth daily after breakfast. ) 30 capsule 1  . doxycycline (VIBRA-TABS) 100 MG tablet Take 1 tablet (100 mg total) by mouth 2 (two) times daily. 20 tablet 0  . Ferrous Sulfate (IRON PO) Take 65 mg by mouth 2 (two) times daily with a meal.    . loratadine (CLARITIN) 10 MG tablet Take 10 mg  by mouth daily.    . Multiple Vitamin (MULTIVITAMIN WITH MINERALS) TABS tablet Take 1 tablet by mouth daily after breakfast.     . pantoprazole (PROTONIX) 40 MG tablet Take 1 tablet (40 mg total) by mouth 2 (two) times daily before a meal. 60 tablet 1  . sulfamethoxazole-trimethoprim (BACTRIM DS) 800-160 MG tablet Take 1 tablet by mouth 2 (two) times daily. 5 day course prescribed on 05/04/2020    . tamsulosin (FLOMAX) 0.4 MG CAPS capsule Take 0.4 mg by mouth daily.    Marland Kitchen diltiazem  (CARDIZEM) 120 MG tablet Take 120 mg by mouth daily. (Patient not taking: Reported on 05/11/2020)      Past Medical History:  Diagnosis Date  . Cocaine abuse ([place])   . ETOH abuse   . GERD (gastroesophageal reflux disease)   . Hyperlipidemia   . Stroke (cerebrum) ([place]) 06/2018   right sided weakness    Past Surgical History:  Procedure Laterality Date  . BIOPSY  08/25/2018   Procedure: BIOPSY;  Surgeon: [name], MD;  [facility];  Service: Endoscopy;;  . BIOPSY  12/13/2018   Procedure: BIOPSY;  Surgeon: [name], MD;  Location: [facility];  Service: Endoscopy;;  esophagus  . COLONOSCOPY WITH PROPOFOL N/A 04/07/2019   Dr. [name]: 3 Tubular adenomas removed (4 to 8 mm in size), next colonoscopy 3 years.  . ESOPHAGOGASTRODUODENOSCOPY (EGD) WITH PROPOFOL N/A 08/25/2018   Dr. [name]: Severely inflamed abnormal appearing mid/distal esophagus.  Encroachment on lumen suspicious for infiltrating neoplasm located be all from severe benign inflammation, extensive gastric erosions and extensive duodenal ulcerations.  Query ischemic process versus other.  Biopsy revealed Barrett's esophagus but negative for malignancy and H. pylori.  . ESOPHAGOGASTRODUODENOSCOPY (EGD) WITH PROPOFOL N/A 12/13/2018   Dr. [name]: peptic stricture s/p balloon dilatation. abnormal esophageal mucosa, by c/w barrett's without dysplasia, previous PUD completely healed.   Marland Kitchen POLYPECTOMY  04/07/2019   Procedure: POLYPECTOMY;  Surgeon: [name], MD;  Location: [facility];  Service: Endoscopy;;  colon    Social History   Tobacco Use  . Smoking status: Former Smoker    Packs/day: 1.00    Years: 40.00    Pack years: 40.00    Types: Cigarettes    Quit date: 06/2018    Years since quitting: 1.9  . Smokeless tobacco: Never Used  Vaping Use  . Vaping Use: Never used  Substance Use Topics  . Alcohol use: Not Currently    Alcohol/week: 12.0 standard drinks    Types: 12 Cans of beer per week     Comment: none since 06/2018  . Drug use: Not Currently    Types: Cocaine    Comment: last cocaine use 07/12/18    Family History  Problem Relation Age of Onset  . Hypertension Father   . Heart attack Father   . Heart disease Father   . Hypertension Mother   . GER disease Mother   . Thyroid disease Mother   . Colon cancer Neg Hx   . Colon polyps Neg Hx     PE: Vitals:   05/13/20 1424 05/13/20 1431 05/13/20 1506 05/13/20 1617  BP: (!) 146/86  (!) 144/84 (!) 130/80  Pulse: (!) 118  (!) 120 (!) 111  Resp:   20 20  Temp: (!) 100.6 F (38.1 C)  98.6 F (37 C) 99 F (37.2 C)  TempSrc: Oral  Oral Oral  SpO2: 95% 92% 97% 96%   Patient appears to  be in no acute distress  patient is alert and oriented x3 Atraumatic normocephalic head No cervical or supraclavicular lymphadenopathy appreciated Slight increased work of breathing Regular sinus rhythm/rate Abdomen is soft, nontender, nondistended, significant left-sided CVA and lower abdominal tenderness Lower extremities are symmetric without appreciable edema Grossly neurologically intact No identifiable skin lesions  Recent Labs    05/11/20 0536 05/12/20 1824 05/13/20 0633  WBC 12.9* 6.8 7.5  HGB 9.5* 8.0* 8.0*  HCT 31.8* 25.4* 26.4*   Recent Labs    05/11/20 0536 05/12/20 1824 05/13/20 0633  NA 135 132* 137  K 3.8 3.3* 3.5  CL 103 101 103  CO2 23 23 23   GLUCOSE 118* 149* 111*  BUN 18 14 10   CREATININE 0.93 0.91 0.84  CALCIUM 8.6* 8.3* 8.6*   Recent Labs    05/10/20 2111 05/11/20 0536  INR 1.1 1.1   No results for input(s): LABURIN in the last 72 hours. Results for orders placed or performed during the hospital encounter of 05/10/20  Blood Culture (routine x 2)     Status: None (Preliminary result)   Collection Time: 05/10/20  9:11 PM   Specimen: BLOOD LEFT ARM  Result Value Ref Range Status   Specimen Description BLOOD LEFT ARM  Final   Special Requests   Final    BOTTLES DRAWN AEROBIC AND ANAEROBIC  Blood Culture adequate volume   Culture   Final    NO GROWTH 2 DAYS Performed at [facility], [address] [place], [place] [address]    Report Status PENDING  Incomplete  Blood Culture (routine x 2)     Status: None (Preliminary result)   Collection Time: 05/10/20 10:32 PM   Specimen: BLOOD  Result Value Ref Range Status   Specimen Description BLOOD LEFT ANTECUBITAL  Final   Special Requests   Final    BOTTLES DRAWN AEROBIC AND ANAEROBIC Blood Culture adequate volume   Culture   Final    NO GROWTH 2 DAYS Performed at [facility], [address] [place], [place] [address]    Report Status PENDING  Incomplete  SARS Coronavirus 2 by RT PCR (hospital order, performed in [facility] hospital lab) Nasopharyngeal Nasopharyngeal Swab     Status: None   Collection Time: 05/10/20 11:58 PM   Specimen: Nasopharyngeal Swab  Result Value Ref Range Status   SARS Coronavirus 2 NEGATIVE NEGATIVE Final    Comment: (NOTE) SARS-CoV-2 target nucleic acids are NOT DETECTED.  The SARS-CoV-2 RNA is generally detectable in upper and lower respiratory specimens during the acute phase of infection. The lowest concentration of SARS-CoV-2 viral copies this assay can detect is 250 copies / mL. A negative result does not preclude SARS-CoV-2 infection and should not be used as the sole basis for treatment or other patient management decisions.  A negative result may occur with improper specimen collection / handling, submission of specimen other than nasopharyngeal swab, presence of viral mutation(s) within the areas targeted by this assay, and inadequate number of viral copies (<250 copies / mL). A negative result must be combined with clinical observations, patient history, and epidemiological information.  Fact Sheet for Patients:   StrictlyIdeas.no  Fact Sheet for Healthcare Providers: BankingDealers.co.za  This test is not yet approved or  cleared  by the [place] FDA and has been authorized for detection and/or diagnosis of SARS-CoV-2 by FDA under an Emergency Use Authorization (EUA).  This EUA will remain in effect (meaning this test can be used) for the duration  of the COVID-19 declaration under Section 564(b)(1) of the Act, 21 U.S.C. section 360bbb-3(b)(1), unless the authorization is terminated or revoked sooner.  Performed at [facility], [address], [place], [place] [address]   Urine culture     Status: None   Collection Time: 05/11/20  9:10 AM   Specimen: Urine, Clean Catch  Result Value Ref Range Status   Specimen Description   Final    URINE, CLEAN CATCH Performed at [facility], [address] [place], [place] [address]    Special Requests   Final    NONE Performed at [facility], [address] [place], [place] [address]    Culture   Final    NO GROWTH Performed at [facility], [place] [address] [place], [place] [address]    Report Status 05/12/2020 FINAL  Final    Imaging: I reviewed the patient's CT scan and discussed it with the patient which demonstrated a large left proximal ureteral stone with associated proximal hydroureteronephrosis.  He also has a small lower pole nonobstructing stone.  Imp: The patient has a bilateral pneumonia, that initially appeared to be improving, although today with this afternoon he has noted some slight worsening of his breathing with increased work.  Fortunately, his kidney stone pain seems to be relatively well managed on oral pain medication.  Recommendations: I again spoke with the patient about the treatment options.  I told him that medical expulsion therapy is not likely a good option given the size of the stone and I do not feel confident that he passed a stone in the near future.  We discussed lithotripsy as well as ureteroscopy.  We also discussed stent placement today.  The patient is opted to continue with our original plan of shockwave lithotripsy.  He  will need follow-up quickly so we get this scheduled.  However, the patient needs to be adequately treated for pneumonia prior to any procedure.   Please contact us if there are any additional questions or concerns prior to the patient's discharge.  We will work to get him scheduled for quick follow-up ASAP.   Ardis Hughs

## 2020-05-13 NOTE — Progress Notes (Signed)
PROGRESS NOTE  Evan Chavez WCB:[id number] DOB: [birth year] DOA: 05/10/2020 PCP: Rosita Fire, MD  Brief History:  59 y.o.malewith medical history significant forprior stroke, hyperlipidemia, GERD, BPH, barrett's esophagus    and remote history of cocaine abuse who presents to the emergency department due to 5 days of weakness, complains of productive cough with clear phlegm with occasional black speckles with associated reproducible midsternal chest pain. Patientwas seen in the ED 2 days ago (7/28) due to similar symptoms, during which he reported a max temperature of 100F.Chest x-ray done at that time showed pneumonia and patient was treated with IV fluid. Amoxicillin and doxycycline were prescribed on the was advised to follow-up with his primary care physician. Patient returnedto the ED yesterday due to increased heart rate and sob.He states that he was taking his blood pressure medication as prescribed and denies cocaine use.   ED Course: In the emergency department, he was tachypneic and tachycardic, BP was 157/106. Work-up in the ED showed leukocytosis, normocytic anemia, hyperglycemia, lactic acid 2.4>1.9, BNP146. SARS coronavirus 2 done on 7/28 was negative and repeated test done yesterday was negative as well. Chest x-ray showed unchanged symmetric bibasilar opacities suspected to be due to combination of pleural effusions and atelectasis/pneumonia. CT angiography ofchest with contrast showed no evidence of pulmonary emboli but showed bilateral pleural effusions and lower lobe consolidation similar to that seen on recent chest x-ray that is consistent with bibasilar pneumonia or reactive effusions.  Assessment/Plan: Sepsis -present on admission -presented with leukocytosis, elevated lactate, tachycardia and radiographic evidence of pneumonia -due to pneumonia -lactate peaked 2.4 -blood cultures remain neg -urine culture neg -UA--no pyuria  Lobar  Pneumonia -7/29 CTA chest--no PE.  Bilateral LL consolidation with pleural effusions -urine legionella and s.pneumoniae antigens neg -continue empiric ceftriaxone and azithromycin -stable on RA but remains sob and tachypneic -start duoneb -start mucinex  Parapneumonic effusions -05/11/20 echo--EF 65-70%, G1DD, trivial MR -now stable on RA, but remains sob  Left ureteral stone -05/11/20 CT abd--Moderate to marked hydronephrosis due to calculus in the proximal to mid LEFT ureter.  Loculated bilateral basilar effusions with similar appearance when compared to the study of May 10, 2020 -consulted urology--discussed with Dr. Louis Meckel  Barrett's esophagus -reflux esophagitis hx which is contributing to esophageal wall thickening -follows [facility] -increase protonix to bid -Repeat EGD in March 2020: Peptic stricture status post balloon dilatation. Abnormal esophageal mucosa biopsy, completely healed previously noted peptic ulcer disease, hiatal hernia. Biopsies with Barrett's esophagus without dysplasia or malignancy -Update EGD in 03/2022 for Barrett's esophagus. Will be due colonoscopy at the same time for tubular adenomas  Hyperlipidemia Continue Lipitor  Incidental finding of thyroid noduleby CT neck of your chest with contrast 81mm left thyroid nodule noted and this is probably stable from prior examination from 2019 and likely benign     Status is: Inpatient  Remains inpatient appropriate because:IV treatments appropriate due to intensity of illness or inability to take PO   Dispo: The patient is from: Home  Anticipated d/c is to: Home  Anticipated d/c date is: 1 day  Patient currently is not medically stable to d/c.        Family Communication:   mother updated at bedside 8/1  Consultants:  urology  Code Status:  FULL--confirmed with mother and patient  DVT Prophylaxis:  [place] Heparin / [place]  Lovenox   Procedures: As Listed in Progress Note Above  Antibiotics: Ceftriaxone/azithro 7/29>>>  Total time spent 35 minutes.  Greater than 50% spent face to face counseling and coordinating care.   Subjective: Patient complains of cough and sob.  States sob not much better.  Denies f/c, vomiting,diarrhea.  Has some right UQ pain.  No diarrhea.  No dysuria.  No headache  Objective: Vitals:   05/12/20 1454 05/12/20 2004 05/13/20 0521 05/13/20 1336  BP: (!) 130/71 (!) 134/86 (!) 140/85   Pulse: 97 97 (!) 108   Resp: 16 16 15    Temp: 98.6 F (37 C) 99.2 F (37.3 C) 99.9 F (37.7 C)   TempSrc: Oral  Oral   SpO2: 98% 97% 96% 100%    Intake/Output Summary (Last 24 hours) at 05/13/2020 1409 Last data filed at 05/12/2020 1637 Gross per 24 hour  Intake --  Output 650 ml  Net -650 ml   Weight change:  Exam:   General:  Pt is alert, follows commands appropriately, not in acute distress  HEENT: No icterus, No thrush, No neck mass, [place]/AT  Cardiovascular: RRR, S1/S2, no rubs, no gallops  Respiratory: diminished BS. Bilateral rales. No wheeze  Abdomen: Soft/+BS, non tender, non distended, no guarding  Extremities: No edema, No lymphangitis, No petechiae, No rashes, no synovitis   Data Reviewed: I have personally reviewed following labs and imaging studies Basic Metabolic Panel: Recent Labs  Lab 05/09/20 1546 05/10/20 2111 05/11/20 0536 05/12/20 1824 05/13/20 0633  NA 138 136 135 132* 137  K 4.0 3.5 3.8 3.3* 3.5  CL 97* 97* 103 101 103  CO2 31 29 23 23 23   GLUCOSE 125* 130* 118* 149* 111*  BUN 36* 22* 18 14 10   CREATININE 1.14 1.03 0.93 0.91 0.84  CALCIUM 10.1 9.7 8.6* 8.3* 8.6*  MG  --   --  1.7  --   --   PHOS  --   --  2.5  --   --    Liver Function Tests: Recent Labs  Lab 05/09/20 1546 05/10/20 2111 05/11/20 0536  AST 14* 17 21  ALT 20 22 25   ALKPHOS 60 58 51  BILITOT 0.3 0.2* 0.4  PROT 7.7 7.4 6.5  ALBUMIN 4.1 3.8 3.2*   No results  for input(s): LIPASE, AMYLASE in the last 168 hours. No results for input(s): AMMONIA in the last 168 hours. Coagulation Profile: Recent Labs  Lab 05/10/20 2111 05/11/20 0536  INR 1.1 1.1   CBC: Recent Labs  Lab 05/09/20 1546 05/10/20 2111 05/11/20 0536 05/12/20 1824 05/13/20 0633  WBC 17.8* 14.6* 12.9* 6.8 7.5  NEUTROABS 15.6* 12.2*  --   --   --   HGB 12.8* 10.9* 9.5* 8.0* 8.0*  HCT 41.6 35.8* 31.8* 25.4* 26.4*  MCV 88.9 90.4 92.2 89.8 88.9  PLT 210 199 159 179 195   Cardiac Enzymes: No results for input(s): CKTOTAL, CKMB, CKMBINDEX, TROPONINI in the last 168 hours. BNP: Invalid input(s): POCBNP CBG: No results for input(s): GLUCAP in the last 168 hours. HbA1C: No results for input(s): HGBA1C in the last 72 hours. Urine analysis:    Component Value Date/Time   COLORURINE YELLOW 05/11/2020 0910   APPEARANCEUR CLEAR 05/11/2020 0910   LABSPEC 1.035 (H) 05/11/2020 0910   PHURINE 7.0 05/11/2020 0910   GLUCOSEU NEGATIVE 05/11/2020 0910   HGBUR NEGATIVE 05/11/2020 0910   BILIRUBINUR NEGATIVE 05/11/2020 0910   BILIRUBINUR NEG 09/22/2018 1527   KETONESUR NEGATIVE 05/11/2020 0910   PROTEINUR 30 (A) 05/11/2020 0910   UROBILINOGEN 0.2 09/22/2018 1527   UROBILINOGEN 0.2 03/03/2012  0205   NITRITE NEGATIVE 05/11/2020 0910   LEUKOCYTESUR NEGATIVE 05/11/2020 0910   Sepsis Labs: @LABRCNTIP (procalcitonin:4,lacticidven:4) ) Recent Results (from the past 240 hour(s))  SARS Coronavirus 2 by RT PCR (hospital order, performed in [facility] hospital lab) Nasopharyngeal Nasopharyngeal Swab     Status: None   Collection Time: 05/09/20  4:08 PM   Specimen: Nasopharyngeal Swab  Result Value Ref Range Status   SARS Coronavirus 2 NEGATIVE NEGATIVE Final    Comment: (NOTE) SARS-CoV-2 target nucleic acids are NOT DETECTED.  The SARS-CoV-2 RNA is generally detectable in upper and lower respiratory specimens during the acute phase of infection. The lowest concentration of SARS-CoV-2  viral copies this assay can detect is 250 copies / mL. A negative result does not preclude SARS-CoV-2 infection and should not be used as the sole basis for treatment or other patient management decisions.  A negative result may occur with improper specimen collection / handling, submission of specimen other than nasopharyngeal swab, presence of viral mutation(s) within the areas targeted by this assay, and inadequate number of viral copies (<250 copies / mL). A negative result must be combined with clinical observations, patient history, and epidemiological information.  Fact Sheet for Patients:   StrictlyIdeas.no  Fact Sheet for Healthcare Providers: BankingDealers.co.za  This test is not yet approved or  cleared by the [place] FDA and has been authorized for detection and/or diagnosis of SARS-CoV-2 by FDA under an Emergency Use Authorization (EUA).  This EUA will remain in effect (meaning this test can be used) for the duration of the COVID-19 declaration under Section 564(b)(1) of the Act, 21 U.S.C. section 360bbb-3(b)(1), unless the authorization is terminated or revoked sooner.  Performed at [facility], [address] [place], [place] [address]   Blood Culture (routine x 2)     Status: None (Preliminary result)   Collection Time: 05/10/20  9:11 PM   Specimen: BLOOD LEFT ARM  Result Value Ref Range Status   Specimen Description BLOOD LEFT ARM  Final   Special Requests   Final    BOTTLES DRAWN AEROBIC AND ANAEROBIC Blood Culture adequate volume   Culture   Final    NO GROWTH 2 DAYS Performed at [facility], [address], [place], [place] [address]    Report Status PENDING  Incomplete  Blood Culture (routine x 2)     Status: None (Preliminary result)   Collection Time: 05/10/20 10:32 PM   Specimen: BLOOD  Result Value Ref Range Status   Specimen Description BLOOD LEFT ANTECUBITAL  Final   Special Requests   Final     BOTTLES DRAWN AEROBIC AND ANAEROBIC Blood Culture adequate volume   Culture   Final    NO GROWTH 2 DAYS Performed at [facility], [address] [place], [place] [address]    Report Status PENDING  Incomplete  SARS Coronavirus 2 by RT PCR (hospital order, performed in [facility] hospital lab) Nasopharyngeal Nasopharyngeal Swab     Status: None   Collection Time: 05/10/20 11:58 PM   Specimen: Nasopharyngeal Swab  Result Value Ref Range Status   SARS Coronavirus 2 NEGATIVE NEGATIVE Final    Comment: (NOTE) SARS-CoV-2 target nucleic acids are NOT DETECTED.  The SARS-CoV-2 RNA is generally detectable in upper and lower respiratory specimens during the acute phase of infection. The lowest concentration of SARS-CoV-2 viral copies this assay can detect is 250 copies / mL. A negative result does not preclude SARS-CoV-2 infection and should not be used as the  sole basis for treatment or other patient management decisions.  A negative result may occur with improper specimen collection / handling, submission of specimen other than nasopharyngeal swab, presence of viral mutation(s) within the areas targeted by this assay, and inadequate number of viral copies (<250 copies / mL). A negative result must be combined with clinical observations, patient history, and epidemiological information.  Fact Sheet for Patients:   StrictlyIdeas.no  Fact Sheet for Healthcare Providers: BankingDealers.co.za  This test is not yet approved or  cleared by the [place] FDA and has been authorized for detection and/or diagnosis of SARS-CoV-2 by FDA under an Emergency Use Authorization (EUA).  This EUA will remain in effect (meaning this test can be used) for the duration of the COVID-19 declaration under Section 564(b)(1) of the Act, 21 U.S.C. section 360bbb-3(b)(1), unless the authorization is terminated or revoked sooner.  Performed at [facility], [address], [place], [place] [address]   Urine culture     Status: None   Collection Time: 05/11/20  9:10 AM   Specimen: Urine, Clean Catch  Result Value Ref Range Status   Specimen Description   Final    URINE, CLEAN CATCH Performed at [facility], [address], [place], [place] [address]    Special Requests   Final    NONE Performed at [facility], [address], [place], [place] [address]    Culture   Final    NO GROWTH Performed at [facility], [place] [address] [place], [place] [address]    Report Status 05/12/2020 FINAL  Final     Scheduled Meds: . amantadine  100 mg Oral BID  . aspirin  325 mg Oral Daily  . atorvastatin  40 mg Oral Daily  . azithromycin  500 mg Oral Daily  . dextromethorphan-guaiFENesin  1 tablet Oral BID  . diltiazem  120 mg Oral QPC breakfast  . pantoprazole  40 mg Oral BID AC  . tamsulosin  0.4 mg Oral Daily   Continuous Infusions: . cefTRIAXone (ROCEPHIN)  IV 2 g (05/12/20 2144)    Procedures/Studies: CT Angio Chest PE W and/or Wo Contrast  Result Date: 05/11/2020 CLINICAL DATA:  Shortness of breath and weakness for several days EXAM: CT ANGIOGRAPHY CHEST WITH CONTRAST TECHNIQUE: Multidetector CT imaging of the chest was performed using the standard protocol during bolus administration of intravenous contrast. Multiplanar CT image reconstructions and MIPs were obtained to evaluate the vascular anatomy. CONTRAST:  44mL OMNIPAQUE IOHEXOL 350 MG/ML SOLN COMPARISON:  Chest x-ray from earlier in the same day, CTA from 07/12/2018. FINDINGS: Cardiovascular: Thoracic aorta demonstrates atherosclerotic calcifications. No aneurysmal dilatation or dissection is noted. No cardiac enlargement is seen. Coronary calcifications are noted. The pulmonary artery shows a normal branching pattern. No definitive filling defect is identified to suggest pulmonary embolism. Mediastinum/Nodes: Thoracic inlet shows a hypodensity within the left lobe of the thyroid  measuring 17 mm in greatest dimension. This is relatively stable from a prior CT examination from 2019. No sizable hilar or mediastinal adenopathy is noted. The esophagus demonstrates some diffuse wall thickening which may be related to reflux esophagitis. Lungs/Pleura: Bilateral moderate pleural effusions are seen. Associated lower lobe consolidation is noted. No sizable parenchymal nodules are seen. No pneumothorax is noted. Upper Abdomen: Visualized upper abdomen shows geographic decreased attenuation within the left kidney and enlargement of the left kidney. This may simply be related to hydronephrosis. The possibility of an infiltrating mass deserves consideration. This can be further evaluated with  CT of the abdomen and pelvis with contrast. Musculoskeletal: No acute bony abnormality is noted. Review of the MIP images confirms the above findings. IMPRESSION: No evidence of pulmonary emboli. Bilateral pleural effusions and lower lobe consolidation similar to that seen on recent chest x-ray. This is consistent with bibasilar pneumonia and reactive effusions. Diffuse wall thickening throughout the esophagus likely related to reflux esophagitis. Clinical correlation is recommended. It should be noted this is stable from a prior exam of 2019. Enlargement of the left kidney with decreased attenuation identified. Although this may simply represent hydronephrosis possibility of an underlying infiltrative mass deserves consideration. CT of the abdomen and pelvis with contrast is recommended. 17 mm left thyroid nodule. This is roughly stable from the prior CT examination from 2019 and likely benign. Recommend thyroid US (ref: J Am Coll Radiol. 2015 Feb;12(2): 143-50). Electronically Signed   By: Inez Catalina M.D.   On: 05/11/2020 00:00   CT ABDOMEN PELVIS W CONTRAST  Result Date: 05/13/2020 CLINICAL DATA:  LEFT renal mass. Suspicion for renal mass on prior chest CT. EXAM: CT ABDOMEN AND PELVIS WITH CONTRAST  TECHNIQUE: Multidetector CT imaging of the abdomen and pelvis was performed using the standard protocol following bolus administration of intravenous contrast. CONTRAST:  115mL OMNIPAQUE IOHEXOL 300 MG/ML  SOLN COMPARISON:  Chest CT of May 10, 2020 FINDINGS: Lower chest: Loculated bilateral basilar effusions with similar appearance when compared to the study of May 10, 2020. Basilar airspace disease with increasing airspace disease particularly at the LEFT lung base. Hiatal hernia and patulous distal esophagus. Calcified coronary artery disease partially imaged. No pericardial effusion. Hepatobiliary: Liver without focal, suspicious lesion. Portal vein is patent. No pericholecystic stranding. No biliary duct dilation. Pancreas: Pancreas is normal without peripancreatic stranding, ductal dilation or inflammation. Spleen: Spleen normal in size and contour. Adrenals/Urinary Tract: Adrenal glands are normal. Moderate to marked LEFT-sided hydroureteronephrosis with overlying cortical thinning. Large calculus in the proximal LEFT ureter measuring 9 x 8 x 8 mm. No perinephric abscess. Urinary bladder is normal. Normal enhancement of the RIGHT kidney. Enhancement of the kidneys despite scarring and dilation is relatively symmetric and there is an additional small calculus to moderate sized calculus in the lower pole the LEFT kidney. Area with limited assessment due to motion artifact. Stomach/Bowel: Hiatal hernia and some distal esophageal thickening as described. Small duodenal diverticulum in the second portion of the duodenum. No acute gastrointestinal process. Appendix not visualized. Motion artifact in this area limiting assessment but no gross findings of inflammation that would indicate pericecal stranding or secondary signs of acute appendicitis. Colonic interposition along the anterior portion of the liver. No pericolonic stranding. No perirectal stranding. Vascular/Lymphatic: Calcific and noncalcific  atheromatous plaque of the abdominal aorta. No aneurysmal dilation. No adenopathy in the retroperitoneum or upper abdomen. No pelvic lymphadenopathy. Reproductive: Heterogeneous prostate. Calcifications of the vas deferens. Other: No ascites. Musculoskeletal: No acute or destructive bone finding. Spinal degenerative changes. IMPRESSION: 1. Moderate to marked hydronephrosis due to calculus in the proximal to mid LEFT ureter. Urologic consultation and correlation with urine studies to exclude concomitant infection is suggested. 2. Cortical thinning that is seen on the LEFT and preservation of renal enhancement suggests that an element of this process could potentially be chronic. 3. Loculated effusions and worsening of LEFT lower lobe airspace disease that may represent a combination of volume loss and pneumonitis 4. Loculated bilateral basilar effusions with similar appearance when compared to the study of May 10, 2020. Basilar airspace disease with increasing  airspace disease particularly at the LEFT lung base. Findings may represent worsening pneumonia or aspiration. 5. Hiatal hernia and patulous distal esophagus. 6. Aortic atherosclerosis. Aortic Atherosclerosis (ICD10-I70.0). Electronically Signed   By: Zetta Bills M.D.   On: 05/13/2020 12:52   DG Chest Port 1 View  Result Date: 05/10/2020 CLINICAL DATA:  Questionable sepsis.  Pneumonia diagnosis yesterday. EXAM: PORTABLE CHEST 1 VIEW COMPARISON:  Radiograph yesterday. FINDINGS: Upper normal heart size. Unchanged mediastinal contours. Hazy symmetric bibasilar opacities are unchanged from radiograph yesterday. No pulmonary edema. No pneumothorax. IMPRESSION: Unchanged symmetric bibasilar opacities are unchanged from yesterday, likely combination of pleural effusions and atelectasis/pneumonia. Electronically Signed   By: Keith Rake M.D.   On: 05/10/2020 21:24   DG Chest Port 1 View  Result Date: 05/09/2020 CLINICAL DATA:  Cough and fever. EXAM:  PORTABLE CHEST 1 VIEW COMPARISON:  09/06/2018 FINDINGS: Heart size is normal. There are bilateral pleural effusions and infiltrate/atelectasis in both lower lungs, consistent with pneumonia. Upper lungs are clear. IMPRESSION: Bilateral lower lobe atelectasis and pneumonia.  Small effusions. Electronically Signed   By: Nelson Chimes M.D.   On: 05/09/2020 14:16   ECHOCARDIOGRAM COMPLETE  Result Date: 05/11/2020    ECHOCARDIOGRAM REPORT   Patient Name:   Evan Chavez Sterbenz Date of Exam: 05/11/2020 Medical Rec #:  [id number]           Height:       69.0 in Accession #:    [id number]          Weight:       153.0 lb Date of Birth:  [birth year]           BSA:          1.844 m Patient Age:    71 years            BP:           141/90 mmHg Patient Gender: M                   HR:           100 bpm. Exam Location:  [facility] Procedure: 2D Echo, Cardiac Doppler and Color Doppler Indications:    CHF  History:        Patient has prior history of Echocardiogram examinations, most                 recent 07/13/2018. CHF, Stroke, Signs/Symptoms:Shortness of                 Breath; Risk Factors:Hypertension and Dyslipidemia. Cocaine                 abuse, Pneumonia.  Sonographer:    Dustin Flock RDCS Referring Phys: [id number] [place]  1. Left ventricular ejection fraction, by estimation, is 65 to 70%. The left ventricle has normal function. The left ventricle has no regional wall motion abnormalities. Left ventricular diastolic parameters are consistent with Grade I diastolic dysfunction (impaired relaxation).  2. Right ventricular systolic function is normal. The right ventricular size is normal. Tricuspid regurgitation signal is inadequate for assessing PA pressure.  3. The mitral valve is grossly normal. Trivial mitral valve regurgitation.  4. The aortic valve is tricuspid. Aortic valve regurgitation is not visualized.  5. The inferior vena cava is normal in size with greater than 50% respiratory variability,  suggesting right atrial pressure of 3 mmHg. FINDINGS  Left Ventricle: Left ventricular ejection fraction, by estimation, is 65 to 70%. The  left ventricle has normal function. The left ventricle has no regional wall motion abnormalities. The left ventricular internal cavity size was normal in size. There is  no left ventricular hypertrophy. Left ventricular diastolic parameters are consistent with Grade I diastolic dysfunction (impaired relaxation). Right Ventricle: The right ventricular size is normal. No increase in right ventricular wall thickness. Right ventricular systolic function is normal. Tricuspid regurgitation signal is inadequate for assessing PA pressure. Left Atrium: Left atrial size was normal in size. Right Atrium: Right atrial size was normal in size. Pericardium: There is no evidence of pericardial effusion. Mitral Valve: The mitral valve is grossly normal. Trivial mitral valve regurgitation. Tricuspid Valve: The tricuspid valve is grossly normal. Tricuspid valve regurgitation is mild. Aortic Valve: The aortic valve is tricuspid. Aortic valve regurgitation is not visualized. Pulmonic Valve: The pulmonic valve was grossly normal. Pulmonic valve regurgitation is trivial. Aorta: The aortic root is normal in size and structure. Venous: The inferior vena cava is normal in size with greater than 50% respiratory variability, suggesting right atrial pressure of 3 mmHg. IAS/Shunts: No atrial level shunt detected by color flow Doppler.  LEFT VENTRICLE PLAX 2D LVIDd:         4.30 cm  Diastology LVIDs:         2.68 cm  LV e' lateral:   6.42 cm/s LV PW:         0.82 cm  LV E/e' lateral: 11.1 LV IVS:        0.98 cm  LV e' medial:    5.11 cm/s LVOT diam:     2.20 cm  LV E/e' medial:  13.9 LV SV:         81 LV SV Index:   44 LVOT Area:     3.80 cm  RIGHT VENTRICLE RV Basal diam:  2.99 cm RV S prime:     21.60 cm/s TAPSE (M-mode): 3.1 cm LEFT ATRIUM             Index       RIGHT ATRIUM           Index LA diam:         3.50 cm 1.90 cm/m  RA Area:     14.20 cm LA Vol (A2C):   42.8 ml 23.21 ml/m RA Volume:   31.20 ml  16.92 ml/m LA Vol (A4C):   46.7 ml 25.33 ml/m LA Biplane Vol: 45.6 ml 24.73 ml/m  AORTIC VALVE LVOT Vmax:   124.00 cm/s LVOT Vmean:  72.900 cm/s LVOT VTI:    0.212 m  AORTA Ao Root diam: 3.00 cm MITRAL VALVE MV Area (PHT): 6.65 cm     SHUNTS MV Decel Time: 114 msec     Systemic VTI:  0.21 m MV E velocity: 71.10 cm/s   Systemic Diam: 2.20 cm MV A velocity: 113.00 cm/s MV E/A ratio:  0.63 Rozann Lesches MD Electronically signed by Rozann Lesches MD Signature Date/Time: 05/11/2020/4:35:39 PM    Final     Orson Eva, DO  [facility]  If 7PM-7AM, please contact night-coverage [email or web address] Password [email or web address] 05/13/2020, 2:09 PM   LOS: 2 days

## 2020-05-13 NOTE — Progress Notes (Signed)
05/13/20 1424  Assess: MEWS Score  Temp (!) 100.6 F (38.1 C)  BP (!) 146/86  Pulse Rate (!) 118  Level of Consciousness Alert  SpO2 95 %  O2 Device Room Air  Assess: MEWS Score  MEWS Temp 1  MEWS Systolic 0  MEWS Pulse 2  MEWS RR 0  MEWS LOC 0  MEWS Score 3  MEWS Score Color Yellow  Assess: if the MEWS score is Yellow or Red  Were vital signs taken at a resting state? Yes  Focused Assessment Change from prior assessment (see assessment flowsheet)  Early Detection of Sepsis Score *See Row Information* Low  MEWS guidelines implemented *See Row Information* No, vital signs rechecked  Treat  MEWS Interventions Administered prn meds/treatments;Consulted Respiratory Therapy  Pain Scale 0-10  Pain Score 5  Pain Type Acute pain;Chronic pain  Pain Location Abdomen  Pain Orientation Right  Pain Descriptors / Indicators Sharp  Pain Frequency Constant  Pain Onset On-going  Patients Stated Pain Goal 0  Pain Intervention(s) Medication (See eMAR)  Multiple Pain Sites No  Take Vital Signs  Increase Vital Sign Frequency  Yellow: Q 2hr X 2 then Q 4hr X 2, if remains yellow, continue Q 4hrs  Escalate  MEWS: Escalate Yellow: discuss with charge nurse/RN and consider discussing with provider and RRT  Notify: Charge Nurse/RN  Name of Charge Nurse/RN Notified Tim Goins  Date Charge Nurse/RN Notified 05/13/20  Time Charge Nurse/RN Notified 34  Notify: Provider  Provider Name/Title Dr. Carles Collet  Date Provider Notified 05/13/20  Time Provider Notified 1434  Notification Type Face-to-face  Notify: Rapid Response  Name of Rapid Response RN Notified Jeanice Lim  Date Rapid Response Notified 05/13/20  Time Rapid Response Notified [id number]     05/13/20 1424  Assess: MEWS Score  Temp (!) 100.6 F (38.1 C)  BP (!) 146/86  Pulse Rate (!) 118  Level of Consciousness Alert  SpO2 95 %  O2 Device Room Air  Assess: MEWS Score  MEWS Temp 1  MEWS Systolic 0  MEWS Pulse 2  MEWS RR 0  MEWS  LOC 0  MEWS Score 3  MEWS Score Color Yellow  Assess: if the MEWS score is Yellow or Red  Were vital signs taken at a resting state? Yes  Focused Assessment Change from prior assessment (see assessment flowsheet)  Early Detection of Sepsis Score *See Row Information* Low  MEWS guidelines implemented *See Row Information* No, vital signs rechecked  Treat  MEWS Interventions Administered prn meds/treatments;Consulted Respiratory Therapy  Pain Scale 0-10  Pain Score 5  Pain Type Acute pain;Chronic pain  Pain Location Abdomen  Pain Orientation Right  Pain Descriptors / Indicators Sharp  Pain Frequency Constant  Pain Onset On-going  Patients Stated Pain Goal 0  Pain Intervention(s) Medication (See eMAR)  Multiple Pain Sites No  Take Vital Signs  Increase Vital Sign Frequency  Yellow: Q 2hr X 2 then Q 4hr X 2, if remains yellow, continue Q 4hrs  Escalate  MEWS: Escalate Yellow: discuss with charge nurse/RN and consider discussing with provider and RRT  Notify: Charge Nurse/RN  Name of Charge Nurse/RN Notified Tim Goins  Date Charge Nurse/RN Notified 05/13/20  Time Charge Nurse/RN Notified 0630  Notify: Provider  Provider Name/Title Dr. Carles Collet  Date Provider Notified 05/13/20  Time Provider Notified 1434  Notification Type Face-to-face  Notify: Rapid Response  Name of Rapid Response RN Notified Jeanice Lim  Date Rapid Response Notified 05/13/20  Time Rapid Response Notified 1429

## 2020-05-14 ENCOUNTER — Other Ambulatory Visit: Payer: Self-pay

## 2020-05-14 ENCOUNTER — Inpatient Hospital Stay (HOSPITAL_COMMUNITY): Payer: Medicaid Other

## 2020-05-14 ENCOUNTER — Encounter (HOSPITAL_COMMUNITY): Payer: Self-pay | Admitting: Internal Medicine

## 2020-05-14 DIAGNOSIS — J189 Pneumonia, unspecified organism: Secondary | ICD-10-CM

## 2020-05-14 DIAGNOSIS — N132 Hydronephrosis with renal and ureteral calculous obstruction: Secondary | ICD-10-CM

## 2020-05-14 DIAGNOSIS — J90 Pleural effusion, not elsewhere classified: Secondary | ICD-10-CM

## 2020-05-14 LAB — BODY FLUID CELL COUNT WITH DIFFERENTIAL
Eos, Fluid: 0 %
Lymphs, Fluid: 6 %
Monocyte-Macrophage-Serous Fluid: 2 % — ABNORMAL LOW (ref 50–90)
Neutrophil Count, Fluid: 92 % — ABNORMAL HIGH (ref 0–25)
Total Nucleated Cell Count, Fluid: 5578 cu mm — ABNORMAL HIGH (ref 0–1000)

## 2020-05-14 LAB — CBC
HCT: 29.5 % — ABNORMAL LOW (ref 39.0–52.0)
Hemoglobin: 9.1 g/dL — ABNORMAL LOW (ref 13.0–17.0)
MCH: 27.2 pg (ref 26.0–34.0)
MCHC: 30.8 g/dL (ref 30.0–36.0)
MCV: 88.1 fL (ref 80.0–100.0)
Platelets: 287 K/uL (ref 150–400)
RBC: 3.35 MIL/uL — ABNORMAL LOW (ref 4.22–5.81)
RDW: 13.1 % (ref 11.5–15.5)
WBC: 12.7 K/uL — ABNORMAL HIGH (ref 4.0–10.5)
nRBC: 0 % (ref 0.0–0.2)

## 2020-05-14 LAB — PROTEIN, PLEURAL OR PERITONEAL FLUID: Total protein, fluid: 4.7 g/dL

## 2020-05-14 LAB — BRAIN NATRIURETIC PEPTIDE: B Natriuretic Peptide: 68 pg/mL (ref 0.0–100.0)

## 2020-05-14 LAB — GRAM STAIN

## 2020-05-14 LAB — BASIC METABOLIC PANEL WITH GFR
Anion gap: 10 (ref 5–15)
BUN: 9 mg/dL (ref 6–20)
CO2: 25 mmol/L (ref 22–32)
Calcium: 8.5 mg/dL — ABNORMAL LOW (ref 8.9–10.3)
Chloride: 102 mmol/L (ref 98–111)
Creatinine, Ser: 0.84 mg/dL (ref 0.61–1.24)
GFR calc Af Amer: >60 mL/min
GFR calc non Af Amer: >60 mL/min
Glucose, Bld: 128 mg/dL — ABNORMAL HIGH (ref 70–99)
Potassium: 3.7 mmol/L (ref 3.5–5.1)
Sodium: 137 mmol/L (ref 135–145)

## 2020-05-14 LAB — LACTATE DEHYDROGENASE, PLEURAL OR PERITONEAL FLUID: LD, Fluid: 1495 U/L — ABNORMAL HIGH (ref 3–23)

## 2020-05-14 LAB — PROCALCITONIN: Procalcitonin: 0.73 ng/mL

## 2020-05-14 MED ORDER — METOPROLOL TARTRATE 5 MG/5ML IV SOLN
5.0000 mg | INTRAVENOUS | Status: DC | PRN
  Administered 2020-05-14 – 2020-05-15 (×2): 5 mg via INTRAVENOUS
  Filled 2020-05-14 (×2): qty 5

## 2020-05-14 MED ORDER — SODIUM CHLORIDE 0.9 % IV SOLN
INTRAVENOUS | Status: DC | PRN
  Administered 2020-05-14: 500 mL via INTRAVENOUS
  Administered 2020-06-06: 1000 mL via INTRAVENOUS

## 2020-05-14 NOTE — Evaluation (Signed)
Clinical/Bedside Swallow Evaluation Patient Details  Name: Evan Chavez MRN: [id number] Date of Birth: [birth year]  Today's Date: 05/14/2020 Time: SLP Start Time (ACUTE ONLY): 1004 SLP Stop Time (ACUTE ONLY): 1024 SLP Time Calculation (min) (ACUTE ONLY): 20 min  Past Medical History:  Past Medical History:  Diagnosis Date  . Cocaine abuse ([place])   . ETOH abuse   . GERD (gastroesophageal reflux disease)   . Hyperlipidemia   . Stroke (cerebrum) ([place]) 06/2018   right sided weakness   Past Surgical History:  Past Surgical History:  Procedure Laterality Date  . BIOPSY  08/25/2018   Procedure: BIOPSY;  Surgeon: [name], MD;  Location: [facility];  Service: Endoscopy;;  . BIOPSY  12/13/2018   Procedure: BIOPSY;  Surgeon: [name], MD;  Location: [facility];  Service: Endoscopy;;  esophagus  . COLONOSCOPY WITH PROPOFOL N/A 04/07/2019   Dr. [name]: 3 Tubular adenomas removed (4 to 8 mm in size), next colonoscopy 3 years.  . ESOPHAGOGASTRODUODENOSCOPY (EGD) WITH PROPOFOL N/A 08/25/2018   Dr. [name]: Severely inflamed abnormal appearing mid/distal esophagus.  Encroachment on lumen suspicious for infiltrating neoplasm located be all from severe benign inflammation, extensive gastric erosions and extensive duodenal ulcerations.  Query ischemic process versus other.  Biopsy revealed Barrett's esophagus but negative for malignancy and H. pylori.  . ESOPHAGOGASTRODUODENOSCOPY (EGD) WITH PROPOFOL N/A 12/13/2018   Dr. [name]: peptic stricture s/p balloon dilatation. abnormal esophageal mucosa, by c/w barrett's without dysplasia, previous PUD completely healed.   Marland Kitchen POLYPECTOMY  04/07/2019   Procedure: POLYPECTOMY;  Surgeon: [name], MD;  Location: [facility];  Service: Endoscopy;;  colon   HPI:  59 y.o. male with medical history significant for prior stroke, hyperlipidemia, GERD, BPH, barrett's esophagus    and remote history of cocaine abuse who presents to the emergency  department due to 5 days of weakness, complains of productive cough with clear phlegm with occasional black speckles with associated reproducible midsternal chest pain. Patient was seen in the ED 2 days ago (7/28) due to similar symptoms, during which he reported a max temperature of 100F.  Chest x-ray done at that time showed pneumonia and patient was treated with IV fluid.  Amoxicillin and doxycycline were prescribed on the was advised to follow-up with his primary care physician.  Patient returned to the ED yesterday due to increased heart rate and sob.  He states that he was taking his blood pressure medication as prescribed and denies cocaine use. Bilateral pleural effusions and lower lobe consolidation similar to that seen on recent chest x-ray. This is consistent with bibasilar pneumonia and reactive effusions. Diffuse wall thickening throughout the esophagus likely related to reflux esophagitis. Clinical correlation is recommended. It should be noted this is stable from a prior exam of 2019.   Assessment / Plan / Recommendation Clinical Impression  Pt seen upright in bed for clinical swallow evaluation after breakfast meal. Pt with right facial asymmetry from previous stroke, congested cough, and low vocal intensity. He denies difficulty swallowing, however does endorse occasional globus sensation with solid foods in his lower neck region. Pt assessed with thin water via cup/straw sips, puree, and regular textures. Pt without overt signs/symptoms of aspiration, however he has a history of stroke, bilateral PNA, poor respiratory status, and dysarthria, so will proceed with MBSS later today. OK to keep diet as is for now. Results to follow.   SLP Visit Diagnosis: Dysphagia, unspecified (R13.10)    Aspiration Risk  Mild  aspiration risk    Diet Recommendation Regular;Thin liquid   Liquid Administration via: Cup;Straw Medication Administration: Whole meds with liquid Supervision: Patient able to  self feed Compensations: Small sips/bites Postural Changes: Seated upright at 90 degrees;Remain upright for at least 30 minutes after po intake    Other  Recommendations Oral Care Recommendations: Oral care BID Other Recommendations: Clarify dietary restrictions   Follow up Recommendations None      Frequency and Duration min 2x/week  1 week       Prognosis Prognosis for Safe Diet Advancement: Good      Swallow Study   General Date of Onset: 05/10/20 HPI: 59 y.o. male with medical history significant for prior stroke, hyperlipidemia, GERD, BPH, barrett's esophagus    and remote history of cocaine abuse who presents to the emergency department due to 5 days of weakness, complains of productive cough with clear phlegm with occasional black speckles with associated reproducible midsternal chest pain. Patient was seen in the ED 2 days ago (7/28) due to similar symptoms, during which he reported a max temperature of 100F.  Chest x-ray done at that time showed pneumonia and patient was treated with IV fluid.  Amoxicillin and doxycycline were prescribed on the was advised to follow-up with his primary care physician.  Patient returned to the ED yesterday due to increased heart rate and sob.  He states that he was taking his blood pressure medication as prescribed and denies cocaine use. Bilateral pleural effusions and lower lobe consolidation similar to that seen on recent chest x-ray. This is consistent with bibasilar pneumonia and reactive effusions. Diffuse wall thickening throughout the esophagus likely related to reflux esophagitis. Clinical correlation is recommended. It should be noted this is stable from a prior exam of 2019. Type of Study: Bedside Swallow Evaluation Previous Swallow Assessment: None on record Diet Prior to this Study: Regular;Thin liquids Temperature Spikes Noted: No Respiratory Status: Nasal cannula History of Recent Intubation: No Behavior/Cognition:  Alert;Cooperative;Pleasant mood Oral Cavity Assessment: Within Functional Limits Oral Care Completed by SLP: No Oral Cavity - Dentition: Adequate natural dentition;Missing dentition Vision: Functional for self-feeding Self-Feeding Abilities: Able to feed self Patient Positioning: Upright in bed Baseline Vocal Quality: Low vocal intensity Volitional Cough: Congested;Weak Volitional Swallow: Able to elicit    Oral/Motor/Sensory Function Overall Oral Motor/Sensory Function: Mild impairment Facial ROM: Reduced right Facial Symmetry: Abnormal symmetry right Facial Strength: Reduced right Lingual ROM: Reduced right Velum: Within Functional Limits Mandible: Within Functional Limits   Ice Chips Ice chips: Within functional limits Presentation: Spoon   Thin Liquid Thin Liquid: Within functional limits Presentation: Cup;Self Fed;Straw    Nectar Thick Nectar Thick Liquid: Not tested   Honey Thick Honey Thick Liquid: Not tested   Puree Puree: Within functional limits Presentation: Spoon   Solid     Solid: Within functional limits Presentation: Self Fed Other Comments: mildly prolonged oral prep     Thank you,  Genene Churn, [place]  Tillie Viverette 05/14/2020,10:25 AM

## 2020-05-14 NOTE — Procedures (Signed)
PreOperative Dx: Complex RIGHT pleural effusion Postoperative Dx: Complex RIGHT pleural effusion Procedure:   US guided RIGHT thoracentesis Radiologist:  Thornton Papas Anesthesia:  10 ml of 1% lidocaine Specimen:  30 mL of cloudy yellow slightly thick RIGHT pleural fluid, cannot exclude empyema EBL:   < 1 ml Complications: Probable RIGHT apex PTX

## 2020-05-14 NOTE — Progress Notes (Signed)
Thoracentesis complete no signs of distress.  

## 2020-05-14 NOTE — Progress Notes (Signed)
    05/14/20 1400  Assess: MEWS Score  Temp 98.8 F (37.1 C)  BP 128/80  Pulse Rate (!) 116  Resp 20  Level of Consciousness Alert  SpO2 90 %  O2 Device Nasal Cannula  O2 Flow Rate (L/min) 3 L/min  Assess: MEWS Score  MEWS Temp 0  MEWS Systolic 0  MEWS Pulse 2  MEWS RR 0  MEWS LOC 0  MEWS Score 2  MEWS Score Color Yellow  Assess: if the MEWS score is Yellow or Red  Were vital signs taken at a resting state? Yes  Focused Assessment No change from prior assessment  MEWS guidelines implemented *See Row Information* Yes  Treat  MEWS Interventions Other (Comment)  Pain Scale 0-10  Pain Score 0  Take Vital Signs  Increase Vital Sign Frequency  Yellow: Q 2hr X 2 then Q 4hr X 2, if remains yellow, continue Q 4hrs  Notify: Charge Nurse/RN  Name of Charge Nurse/RN Notified Tiffany Voglar  Date Charge Nurse/RN Notified 05/14/20  Time Charge Nurse/RN Notified 1431

## 2020-05-14 NOTE — Progress Notes (Signed)
D/w Dr Thornton Papas, loculated effusion on RT  Diagnostic tap performed with CXR showing RT apical pneumothorax Appears comfortable on 6L [place] satn 92% , no increased WOB, poor cough, decreased BS on RT Rpt CXR planned  D/w hospitalist - suggest transfer to SDU for monitoring , not a great candidate for VATS but may have to consider depending on pleural fluid results  Teresa Lemmerman V. Elsworth Soho MD

## 2020-05-14 NOTE — Progress Notes (Signed)
PROGRESS NOTE  Evan Chavez VHQ:[id number] DOB: Jun 01, 1961 DOA: 05/10/2020 PCP: Rosita Fire, MD Brief History: 59 y.o.malewith medical history significant forprior stroke, hyperlipidemia, GERD, BPH, barrett's esophagusand remote history of cocaine abuse who presents to the emergency department due to 5 days of weakness, complains of productive cough with clear phlegm with occasional black speckles with associated reproducible midsternal chest pain. Patientwas seen in the ED 2 days ago (7/28) due to similar symptoms, during which he reported a max temperature of 100F.Chest x-ray done at that time showed pneumonia and patient was treated with IV fluid. Amoxicillinand doxycycline wereprescribed on the was advised to follow-up with his primary care physician. Patient returnedto the ED yesterday due to increased heart rateand sob.He states that he was taking his blood pressure medication as prescribed and denies cocaine use.   ED Course: In the emergency department, he was tachypneic and tachycardic, BP was 157/106. Work-up in the ED showed leukocytosis, normocytic anemia, hyperglycemia, lactic acid 2.4>1.9, BNP146. SARS coronavirus 2 done on 7/28 was negative and repeated test done yesterday was negative as well. Chest x-ray showed unchanged symmetric bibasilar opacities suspected to be due to combination of pleural effusions and atelectasis/pneumonia. CT angiography ofchest with contrast showed no evidence of pulmonary emboli but showed bilateral pleural effusions and lower lobe consolidation similar to that seen on recent chest x-ray that is consistent with bibasilar pneumonia or reactive effusions.  Assessment/Plan: Sepsis -present on admission -presented with leukocytosis, elevated lactate, tachycardia and radiographic evidence of pneumonia -due to pneumonia -lactate peaked 2.4 -blood cultures remain neg -urine culture neg -UA--no pyuria  Lobar  Pneumonia -7/29 CTA chest--no PE. Bilateral LL consolidation with pleural effusions -urine legionella and s.pneumoniae antigens neg -continue empiric ceftriaxone and azithromycin -stable on RA but remains sob and tachypneic -continue duoneb -continue mucinex -finish 5 days azithromycin -continue ceftriaxone for now  Parapneumonic effusions -05/11/20 echo--EF 65-70%, G1DD, trivial MR -now stable on RA, but remains sob -pulmonary consult appreciated-->thoracocentesis  Left ureteral stone -05/11/20 CT abd--Moderate to marked hydronephrosis due to calculus in the proximal to mid LEFT ureter.  Loculated bilateral basilar effusions with similar appearance when compared to the study of May 10, 2020 -consulted urology--discussed with Dr. Hassan Rowan outpatient lithotripsy  Barrett's esophagus -reflux esophagitis hx which is contributing to esophageal wall thickening -follows [facility] -increase protonix to bid -Repeat EGD in March 2020: Peptic stricture status post balloon dilatation. Abnormal esophageal mucosa biopsy, completely healed previously noted peptic ulcer disease, hiatal hernia. Biopsies with Barrett's esophagus without dysplasia or malignancy -Update EGD in 03/2022 for Barrett's esophagus. Will be due colonoscopy at the same time for tubular adenomas  Hyperlipidemia Continue Lipitor  Incidental finding of thyroid noduleby CT neck of your chest with contrast 83mm left thyroid nodule noted and this is probably stable from priorexamination from 2019 and likely benign     Status is: Inpatient  Remains inpatient appropriate because:IV treatments appropriate due to intensity of illness or inability to take PO   Dispo: The patient is from:Home Anticipated d/c is UX:LKGM Anticipated d/c date is: 1 day Patient currently is not medically stable to d/c.        Family Communication: mother  updated at bedside 8/1  Consultants: urology  Code Status: FULL--confirmed with mother and patient  DVT Prophylaxis: [place] Heparin / [place] Lovenox   Procedures: As Listed in Progress Note Above  Antibiotics: Ceftriaxone>>> azithro 7/29>>>8/2       Subjective: Pt feels he  is breathing a little better today.  He denies cp, n/v/d abd pain. He still has nonproductive cough  Objective: Vitals:   05/14/20 0803 05/14/20 1333 05/14/20 1400 05/14/20 1511  BP:   128/80 129/85  Pulse:   (!) 116 (!) 116  Resp:   20 20  Temp:   98.8 F (37.1 C)   TempSrc:   Oral   SpO2: 90% 90% 90% 90%    Intake/Output Summary (Last 24 hours) at 05/14/2020 1534 Last data filed at 05/14/2020 1300 Gross per 24 hour  Intake 900 ml  Output 400 ml  Net 500 ml   Weight change:  Exam:   General:  Pt is alert, follows commands appropriately, not in acute distress  HEENT: No icterus, No thrush, No neck mass, [place]/AT  Cardiovascular: RRR, S1/S2, no rubs, no gallops  Respiratory: bibasilar rales. No wheeze  Abdomen: Soft/+BS, non tender, non distended, no guarding  Extremities: No edema, No lymphangitis, No petechiae, No rashes, no synovitis   Data Reviewed: I have personally reviewed following labs and imaging studies Basic Metabolic Panel: Recent Labs  Lab 05/10/20 2111 05/11/20 0536 05/12/20 1824 05/13/20 0633 05/14/20 0604  NA 136 135 132* 137 137  K 3.5 3.8 3.3* 3.5 3.7  CL 97* 103 101 103 102  CO2 29 23 23 23 25   GLUCOSE 130* 118* 149* 111* 128*  BUN 22* 18 14 10 9   CREATININE 1.03 0.93 0.91 0.84 0.84  CALCIUM 9.7 8.6* 8.3* 8.6* 8.5*  MG  --  1.7  --   --   --   PHOS  --  2.5  --   --   --    Liver Function Tests: Recent Labs  Lab 05/09/20 1546 05/10/20 2111 05/11/20 0536  AST 14* 17 21  ALT 20 22 25   ALKPHOS 60 58 51  BILITOT 0.3 0.2* 0.4  PROT 7.7 7.4 6.5  ALBUMIN 4.1 3.8 3.2*   No results for input(s): LIPASE, AMYLASE in the last 168 hours. No results for  input(s): AMMONIA in the last 168 hours. Coagulation Profile: Recent Labs  Lab 05/10/20 2111 05/11/20 0536  INR 1.1 1.1   CBC: Recent Labs  Lab 05/09/20 1546 05/09/20 1546 05/10/20 2111 05/11/20 0536 05/12/20 1824 05/13/20 0633 05/14/20 0604  WBC 17.8*   < > 14.6* 12.9* 6.8 7.5 12.7*  NEUTROABS 15.6*  --  12.2*  --   --   --   --   HGB 12.8*   < > 10.9* 9.5* 8.0* 8.0* 9.1*  HCT 41.6   < > 35.8* 31.8* 25.4* 26.4* 29.5*  MCV 88.9   < > 90.4 92.2 89.8 88.9 88.1  PLT 210   < > 199 159 179 195 287   < > = values in this interval not displayed.   Cardiac Enzymes: No results for input(s): CKTOTAL, CKMB, CKMBINDEX, TROPONINI in the last 168 hours. BNP: Invalid input(s): POCBNP CBG: No results for input(s): GLUCAP in the last 168 hours. HbA1C: No results for input(s): HGBA1C in the last 72 hours. Urine analysis:    Component Value Date/Time   COLORURINE YELLOW 05/11/2020 0910   APPEARANCEUR CLEAR 05/11/2020 0910   LABSPEC 1.035 (H) 05/11/2020 0910   PHURINE 7.0 05/11/2020 0910   GLUCOSEU NEGATIVE 05/11/2020 0910   HGBUR NEGATIVE 05/11/2020 0910   BILIRUBINUR NEGATIVE 05/11/2020 0910   BILIRUBINUR NEG 09/22/2018 1527   KETONESUR NEGATIVE 05/11/2020 0910   PROTEINUR 30 (A) 05/11/2020 0910   UROBILINOGEN 0.2 09/22/2018 1527  UROBILINOGEN 0.2 03/03/2012 0205   NITRITE NEGATIVE 05/11/2020 0910   LEUKOCYTESUR NEGATIVE 05/11/2020 0910   Sepsis Labs: @LABRCNTIP (procalcitonin:4,lacticidven:4) ) Recent Results (from the past 240 hour(s))  SARS Coronavirus 2 by RT PCR (hospital order, performed in [facility] hospital lab) Nasopharyngeal Nasopharyngeal Swab     Status: None   Collection Time: 05/09/20  4:08 PM   Specimen: Nasopharyngeal Swab  Result Value Ref Range Status   SARS Coronavirus 2 NEGATIVE NEGATIVE Final    Comment: (NOTE) SARS-CoV-2 target nucleic acids are NOT DETECTED.  The SARS-CoV-2 RNA is generally detectable in upper and lower respiratory specimens  during the acute phase of infection. The lowest concentration of SARS-CoV-2 viral copies this assay can detect is 250 copies / mL. A negative result does not preclude SARS-CoV-2 infection and should not be used as the sole basis for treatment or other patient management decisions.  A negative result may occur with improper specimen collection / handling, submission of specimen other than nasopharyngeal swab, presence of viral mutation(s) within the areas targeted by this assay, and inadequate number of viral copies (<250 copies / mL). A negative result must be combined with clinical observations, patient history, and epidemiological information.  Fact Sheet for Patients:   StrictlyIdeas.no  Fact Sheet for Healthcare Providers: BankingDealers.co.za  This test is not yet approved or  cleared by the [place] FDA and has been authorized for detection and/or diagnosis of SARS-CoV-2 by FDA under an Emergency Use Authorization (EUA).  This EUA will remain in effect (meaning this test can be used) for the duration of the COVID-19 declaration under Section 564(b)(1) of the Act, 21 U.S.C. section 360bbb-3(b)(1), unless the authorization is terminated or revoked sooner.  Performed at [facility], [address], [place], [place] [address]   Blood Culture (routine x 2)     Status: None (Preliminary result)   Collection Time: 05/10/20  9:11 PM   Specimen: BLOOD LEFT ARM  Result Value Ref Range Status   Specimen Description BLOOD LEFT ARM  Final   Special Requests   Final    BOTTLES DRAWN AEROBIC AND ANAEROBIC Blood Culture adequate volume   Culture   Final    NO GROWTH 4 DAYS Performed at [facility], [address] [place], [place] [address]    Report Status PENDING  Incomplete  Blood Culture (routine x 2)     Status: None (Preliminary result)   Collection Time: 05/10/20 10:32 PM   Specimen: BLOOD  Result Value Ref Range Status    Specimen Description BLOOD LEFT ANTECUBITAL  Final   Special Requests   Final    BOTTLES DRAWN AEROBIC AND ANAEROBIC Blood Culture adequate volume   Culture   Final    NO GROWTH 4 DAYS Performed at [facility], [address], [place], [place] [address]    Report Status PENDING  Incomplete  SARS Coronavirus 2 by RT PCR (hospital order, performed in [place] hospital lab) Nasopharyngeal Nasopharyngeal Swab     Status: None   Collection Time: 05/10/20 11:58 PM   Specimen: Nasopharyngeal Swab  Result Value Ref Range Status   SARS Coronavirus 2 NEGATIVE NEGATIVE Final    Comment: (NOTE) SARS-CoV-2 target nucleic acids are NOT DETECTED.  The SARS-CoV-2 RNA is generally detectable in upper and lower respiratory specimens during the acute phase of infection. The lowest concentration of SARS-CoV-2 viral copies this assay can detect is 250 copies / mL. A negative result does not preclude SARS-CoV-2 infection and should not be  used as the sole basis for treatment or other patient management decisions.  A negative result may occur with improper specimen collection / handling, submission of specimen other than nasopharyngeal swab, presence of viral mutation(s) within the areas targeted by this assay, and inadequate number of viral copies (<250 copies / mL). A negative result must be combined with clinical observations, patient history, and epidemiological information.  Fact Sheet for Patients:   StrictlyIdeas.no  Fact Sheet for Healthcare Providers: BankingDealers.co.za  This test is not yet approved or  cleared by the [place] FDA and has been authorized for detection and/or diagnosis of SARS-CoV-2 by FDA under an Emergency Use Authorization (EUA).  This EUA will remain in effect (meaning this test can be used) for the duration of the COVID-19 declaration under Section 564(b)(1) of the Act, 21 U.S.C. section 360bbb-3(b)(1), unless the  authorization is terminated or revoked sooner.  Performed at [facility], [address], [place], [place] [address]   Urine culture     Status: None   Collection Time: 05/11/20  9:10 AM   Specimen: Urine, Clean Catch  Result Value Ref Range Status   Specimen Description   Final    URINE, CLEAN CATCH Performed at [facility], [address], [place], [place] [address]    Special Requests   Final    NONE Performed at [facility], [address] [place], [place] [address]    Culture   Final    NO GROWTH Performed at [facility], [place] [address] [place], [place] [address]    Report Status 05/12/2020 FINAL  Final     Scheduled Meds: . amantadine  100 mg Oral BID  . aspirin  325 mg Oral Daily  . atorvastatin  40 mg Oral Daily  . azithromycin  500 mg Oral Daily  . dextromethorphan-guaiFENesin  1 tablet Oral BID  . diltiazem  120 mg Oral QPC breakfast  . ipratropium-albuterol  3 mL Nebulization Q6H  . pantoprazole  40 mg Oral BID AC  . tamsulosin  0.4 mg Oral Daily   Continuous Infusions: . cefTRIAXone (ROCEPHIN)  IV 2 g (05/13/20 2038)    Procedures/Studies: CT Angio Chest PE W and/or Wo Contrast  Result Date: 05/11/2020 CLINICAL DATA:  Shortness of breath and weakness for several days EXAM: CT ANGIOGRAPHY CHEST WITH CONTRAST TECHNIQUE: Multidetector CT imaging of the chest was performed using the standard protocol during bolus administration of intravenous contrast. Multiplanar CT image reconstructions and MIPs were obtained to evaluate the vascular anatomy. CONTRAST:  55mL OMNIPAQUE IOHEXOL 350 MG/ML SOLN COMPARISON:  Chest x-ray from earlier in the same day, CTA from 07/12/2018. FINDINGS: Cardiovascular: Thoracic aorta demonstrates atherosclerotic calcifications. No aneurysmal dilatation or dissection is noted. No cardiac enlargement is seen. Coronary calcifications are noted. The pulmonary artery shows a normal branching pattern. No definitive filling defect is  identified to suggest pulmonary embolism. Mediastinum/Nodes: Thoracic inlet shows a hypodensity within the left lobe of the thyroid measuring 17 mm in greatest dimension. This is relatively stable from a prior CT examination from 2019. No sizable hilar or mediastinal adenopathy is noted. The esophagus demonstrates some diffuse wall thickening which may be related to reflux esophagitis. Lungs/Pleura: Bilateral moderate pleural effusions are seen. Associated lower lobe consolidation is noted. No sizable parenchymal nodules are seen. No pneumothorax is noted. Upper Abdomen: Visualized upper abdomen shows geographic decreased attenuation within the left kidney and enlargement of the left kidney. This may simply be related to hydronephrosis. The possibility of  an infiltrating mass deserves consideration. This can be further evaluated with CT of the abdomen and pelvis with contrast. Musculoskeletal: No acute bony abnormality is noted. Review of the MIP images confirms the above findings. IMPRESSION: No evidence of pulmonary emboli. Bilateral pleural effusions and lower lobe consolidation similar to that seen on recent chest x-ray. This is consistent with bibasilar pneumonia and reactive effusions. Diffuse wall thickening throughout the esophagus likely related to reflux esophagitis. Clinical correlation is recommended. It should be noted this is stable from a prior exam of 2019. Enlargement of the left kidney with decreased attenuation identified. Although this may simply represent hydronephrosis possibility of an underlying infiltrative mass deserves consideration. CT of the abdomen and pelvis with contrast is recommended. 17 mm left thyroid nodule. This is roughly stable from the prior CT examination from 2019 and likely benign. Recommend thyroid US (ref: J Am Coll Radiol. 2015 Feb;12(2): 143-50). Electronically Signed   By: Inez Catalina M.D.   On: 05/11/2020 00:00   CT ABDOMEN PELVIS W CONTRAST  Result Date:  05/13/2020 CLINICAL DATA:  LEFT renal mass. Suspicion for renal mass on prior chest CT. EXAM: CT ABDOMEN AND PELVIS WITH CONTRAST TECHNIQUE: Multidetector CT imaging of the abdomen and pelvis was performed using the standard protocol following bolus administration of intravenous contrast. CONTRAST:  140mL OMNIPAQUE IOHEXOL 300 MG/ML  SOLN COMPARISON:  Chest CT of May 10, 2020 FINDINGS: Lower chest: Loculated bilateral basilar effusions with similar appearance when compared to the study of May 10, 2020. Basilar airspace disease with increasing airspace disease particularly at the LEFT lung base. Hiatal hernia and patulous distal esophagus. Calcified coronary artery disease partially imaged. No pericardial effusion. Hepatobiliary: Liver without focal, suspicious lesion. Portal vein is patent. No pericholecystic stranding. No biliary duct dilation. Pancreas: Pancreas is normal without peripancreatic stranding, ductal dilation or inflammation. Spleen: Spleen normal in size and contour. Adrenals/Urinary Tract: Adrenal glands are normal. Moderate to marked LEFT-sided hydroureteronephrosis with overlying cortical thinning. Large calculus in the proximal LEFT ureter measuring 9 x 8 x 8 mm. No perinephric abscess. Urinary bladder is normal. Normal enhancement of the RIGHT kidney. Enhancement of the kidneys despite scarring and dilation is relatively symmetric and there is an additional small calculus to moderate sized calculus in the lower pole the LEFT kidney. Area with limited assessment due to motion artifact. Stomach/Bowel: Hiatal hernia and some distal esophageal thickening as described. Small duodenal diverticulum in the second portion of the duodenum. No acute gastrointestinal process. Appendix not visualized. Motion artifact in this area limiting assessment but no gross findings of inflammation that would indicate pericecal stranding or secondary signs of acute appendicitis. Colonic interposition along the anterior  portion of the liver. No pericolonic stranding. No perirectal stranding. Vascular/Lymphatic: Calcific and noncalcific atheromatous plaque of the abdominal aorta. No aneurysmal dilation. No adenopathy in the retroperitoneum or upper abdomen. No pelvic lymphadenopathy. Reproductive: Heterogeneous prostate. Calcifications of the vas deferens. Other: No ascites. Musculoskeletal: No acute or destructive bone finding. Spinal degenerative changes. IMPRESSION: 1. Moderate to marked hydronephrosis due to calculus in the proximal to mid LEFT ureter. Urologic consultation and correlation with urine studies to exclude concomitant infection is suggested. 2. Cortical thinning that is seen on the LEFT and preservation of renal enhancement suggests that an element of this process could potentially be chronic. 3. Loculated effusions and worsening of LEFT lower lobe airspace disease that may represent a combination of volume loss and pneumonitis 4. Loculated bilateral basilar effusions with similar appearance when compared to  the study of May 10, 2020. Basilar airspace disease with increasing airspace disease particularly at the LEFT lung base. Findings may represent worsening pneumonia or aspiration. 5. Hiatal hernia and patulous distal esophagus. 6. Aortic atherosclerosis. Aortic Atherosclerosis (ICD10-I70.0). Electronically Signed   By: Zetta Bills M.D.   On: 05/13/2020 12:52   DG Chest Port 1 View  Result Date: 05/10/2020 CLINICAL DATA:  Questionable sepsis.  Pneumonia diagnosis yesterday. EXAM: PORTABLE CHEST 1 VIEW COMPARISON:  Radiograph yesterday. FINDINGS: Upper normal heart size. Unchanged mediastinal contours. Hazy symmetric bibasilar opacities are unchanged from radiograph yesterday. No pulmonary edema. No pneumothorax. IMPRESSION: Unchanged symmetric bibasilar opacities are unchanged from yesterday, likely combination of pleural effusions and atelectasis/pneumonia. Electronically Signed   By: Keith Rake  M.D.   On: 05/10/2020 21:24   DG Chest Port 1 View  Result Date: 05/09/2020 CLINICAL DATA:  Cough and fever. EXAM: PORTABLE CHEST 1 VIEW COMPARISON:  09/06/2018 FINDINGS: Heart size is normal. There are bilateral pleural effusions and infiltrate/atelectasis in both lower lungs, consistent with pneumonia. Upper lungs are clear. IMPRESSION: Bilateral lower lobe atelectasis and pneumonia.  Small effusions. Electronically Signed   By: Nelson Chimes M.D.   On: 05/09/2020 14:16   DG Swallowing Func-Speech Pathology  Result Date: 05/14/2020 Objective Swallowing Evaluation: Type of Study: MBS-Modified Barium Swallow Study  Patient Details Name: Phinneas Shakoor MRN: [id number] Date of Birth: 17-May-1961 Today's Date: 05/14/2020 Time: SLP Start Time (ACUTE ONLY): 33 -SLP Stop Time (ACUTE ONLY): 1300 SLP Time Calculation (min) (ACUTE ONLY): 30 min Past Medical History: Past Medical History: Diagnosis Date . Cocaine abuse ([place])  . ETOH abuse  . GERD (gastroesophageal reflux disease)  . Hyperlipidemia  . Stroke (cerebrum) ([place]) 06/2018  right sided weakness Past Surgical History: Past Surgical History: Procedure Laterality Date . BIOPSY  08/25/2018  Procedure: BIOPSY;  Surgeon: [name], MD;  [facility]: [facility];  Service: Endoscopy;; . BIOPSY  12/13/2018  Procedure: BIOPSY;  Surgeon: [name], MD;  Location: [facility];  Service: Endoscopy;;  esophagus . COLONOSCOPY WITH PROPOFOL N/A 04/07/2019  Dr. [name]: 3 Tubular adenomas removed (4 to 8 mm in size), next colonoscopy 3 years. . ESOPHAGOGASTRODUODENOSCOPY (EGD) WITH PROPOFOL N/A 08/25/2018  Dr. [name]: Severely inflamed abnormal appearing mid/distal esophagus.  Encroachment on lumen suspicious for infiltrating neoplasm located be all from severe benign inflammation, extensive gastric erosions and extensive duodenal ulcerations.  Query ischemic process versus other.  Biopsy revealed Barrett's esophagus but negative for malignancy and H. pylori. .  ESOPHAGOGASTRODUODENOSCOPY (EGD) WITH PROPOFOL N/A 12/13/2018  Dr. [name]: peptic stricture s/p balloon dilatation. abnormal esophageal mucosa, by c/w barrett's without dysplasia, previous PUD completely healed.  Marland Kitchen POLYPECTOMY  04/07/2019  Procedure: POLYPECTOMY;  Surgeon: [name], MD;  Location: [facility];  Service: Endoscopy;;  colon HPI: 59 y.o. male with medical history significant for prior stroke, hyperlipidemia, GERD, BPH, barrett's esophagus    and remote history of cocaine abuse who presents to the emergency department due to 5 days of weakness, complains of productive cough with clear phlegm with occasional black speckles with associated reproducible midsternal chest pain. Patient was seen in the ED 2 days ago (7/28) due to similar symptoms, during which he reported a max temperature of 100F.  Chest x-ray done at that time showed pneumonia and patient was treated with IV fluid.  Amoxicillin and doxycycline were prescribed on the was advised to follow-up with his primary care physician.  Patient returned to the ED yesterday  due to increased heart rate and sob.  He states that he was taking his blood pressure medication as prescribed and denies cocaine use. Bilateral pleural effusions and lower lobe consolidation similar to that seen on recent chest x-ray. This is consistent with bibasilar pneumonia and reactive effusions. Diffuse wall thickening throughout the esophagus likely related to reflux esophagitis. Clinical correlation is recommended. It should be noted this is stable from a prior exam of 2019.  Subjective: "My chest hurts when I am coughing sometimes." Assessment / Plan / Recommendation CHL IP CLINICAL IMPRESSIONS 05/14/2020 Clinical Impression Pt presents with mild oropharyngeal phase dysphagia characterized by lingual pumping with reduced bolus cohesiveness and premature spillage of liquids to the valleculae. Hyolaryngeal excursion appeared WNL. Pt without penetration/aspiration across  consistencies/textures and only with trace amounts of thin liquid residuals along base of tongue and in lateral channels which cleared with secondary swallow. Pt is currently on 4L O2 via nasal cannula. Risk for aspiration with po intake appears minimal, however his respiratory status slightly increases his risk. Pt also with history of esophageal dysphagia. Recommend regular textures and thin liquids with standard aspiration and reflux precautions. No further SLP services indicated at this time. This study was reviewed with Dr. Elsworth Soho (pulmonary).  SLP Visit Diagnosis Dysphagia, oropharyngeal phase (R13.12) Attention and concentration deficit following -- Frontal lobe and executive function deficit following -- Impact on safety and function Mild aspiration risk   CHL IP TREATMENT RECOMMENDATION 05/14/2020 Treatment Recommendations No treatment recommended at this time   Prognosis 05/14/2020 Prognosis for Safe Diet Advancement Good Barriers to Reach Goals -- Barriers/Prognosis Comment -- CHL IP DIET RECOMMENDATION 05/14/2020 SLP Diet Recommendations Regular solids;Thin liquid Liquid Administration via Cup;Straw Medication Administration Whole meds with liquid Compensations Slow rate;Small sips/bites Postural Changes Seated upright at 90 degrees;Remain semi-upright after after feeds/meals (Comment)   CHL IP OTHER RECOMMENDATIONS 05/14/2020 Recommended Consults -- Oral Care Recommendations Oral care BID Other Recommendations Clarify dietary restrictions   CHL IP FOLLOW UP RECOMMENDATIONS 05/14/2020 Follow up Recommendations None   CHL IP FREQUENCY AND DURATION 05/14/2020 Speech Therapy Frequency (ACUTE ONLY) min 2x/week Treatment Duration 1 week      CHL IP ORAL PHASE 05/14/2020 Oral Phase Impaired Oral - Pudding Teaspoon -- Oral - Pudding Cup -- Oral - Honey Teaspoon -- Oral - Honey Cup -- Oral - Nectar Teaspoon -- Oral - Nectar Cup -- Oral - Nectar Straw -- Oral - Thin Teaspoon Premature spillage Oral - Thin Cup Premature  spillage;Decreased bolus cohesion Oral - Thin Straw Premature spillage Oral - Puree Lingual pumping Oral - Mech Soft -- Oral - Regular Delayed oral transit Oral - Multi-Consistency -- Oral - Pill -- Oral Phase - Comment --  CHL IP PHARYNGEAL PHASE 05/14/2020 Pharyngeal Phase Impaired Pharyngeal- Pudding Teaspoon -- Pharyngeal -- Pharyngeal- Pudding Cup -- Pharyngeal -- Pharyngeal- Honey Teaspoon -- Pharyngeal -- Pharyngeal- Honey Cup -- Pharyngeal -- Pharyngeal- Nectar Teaspoon -- Pharyngeal -- Pharyngeal- Nectar Cup -- Pharyngeal -- Pharyngeal- Nectar Straw -- Pharyngeal -- Pharyngeal- Thin Teaspoon Delayed swallow initiation-vallecula Pharyngeal -- Pharyngeal- Thin Cup Delayed swallow initiation-vallecula;Lateral channel residue Pharyngeal -- Pharyngeal- Thin Straw Lateral channel residue;Delayed swallow initiation-vallecula Pharyngeal -- Pharyngeal- Puree Delayed swallow initiation-vallecula;WFL Pharyngeal -- Pharyngeal- Mechanical Soft -- Pharyngeal -- Pharyngeal- Regular WFL Pharyngeal -- Pharyngeal- Multi-consistency -- Pharyngeal -- Pharyngeal- Pill WFL Pharyngeal -- Pharyngeal Comment --  CHL IP CERVICAL ESOPHAGEAL PHASE 05/14/2020 Cervical Esophageal Phase WFL Pudding Teaspoon -- Pudding Cup -- Honey Teaspoon -- Honey Cup -- Nectar Teaspoon -- Nectar  Cup -- [employer] -- Thin Teaspoon -- Thin Cup -- Thin Straw -- Puree -- Mechanical Soft -- Regular -- Multi-consistency -- Pill -- Cervical Esophageal Comment -- Thank you, Genene Churn, [place] PORTER,DABNEY 05/14/2020, 2:21 PM              ECHOCARDIOGRAM COMPLETE  Result Date: 05/11/2020    ECHOCARDIOGRAM REPORT   Patient Name:   TRAYE BATES Archuletta Date of Exam: 05/11/2020 Medical Rec #:  [id number]           Height:       69.0 in Accession #:    [id number]          Weight:       153.0 lb Date of Birth:  [birth year]           BSA:          1.844 m Patient Age:    3 years            BP:           141/90 mmHg Patient Gender: M                    HR:           100 bpm. Exam Location:  [facility] Procedure: 2D Echo, Cardiac Doppler and Color Doppler Indications:    CHF  History:        Patient has prior history of Echocardiogram examinations, most                 recent 07/13/2018. CHF, Stroke, Signs/Symptoms:Shortness of                 Breath; Risk Factors:Hypertension and Dyslipidemia. Cocaine                 abuse, Pneumonia.  Sonographer:    Dustin Flock RDCS Referring Phys: [id number] [place]  1. Left ventricular ejection fraction, by estimation, is 65 to 70%. The left ventricle has normal function. The left ventricle has no regional wall motion abnormalities. Left ventricular diastolic parameters are consistent with Grade I diastolic dysfunction (impaired relaxation).  2. Right ventricular systolic function is normal. The right ventricular size is normal. Tricuspid regurgitation signal is inadequate for assessing PA pressure.  3. The mitral valve is grossly normal. Trivial mitral valve regurgitation.  4. The aortic valve is tricuspid. Aortic valve regurgitation is not visualized.  5. The inferior vena cava is normal in size with greater than 50% respiratory variability, suggesting right atrial pressure of 3 mmHg. FINDINGS  Left Ventricle: Left ventricular ejection fraction, by estimation, is 65 to 70%. The left ventricle has normal function. The left ventricle has no regional wall motion abnormalities. The left ventricular internal cavity size was normal in size. There is  no left ventricular hypertrophy. Left ventricular diastolic parameters are consistent with Grade I diastolic dysfunction (impaired relaxation). Right Ventricle: The right ventricular size is normal. No increase in right ventricular wall thickness. Right ventricular systolic function is normal. Tricuspid regurgitation signal is inadequate for assessing PA pressure. Left Atrium: Left atrial size was normal in size. Right Atrium: Right atrial size was normal in size.  Pericardium: There is no evidence of pericardial effusion. Mitral Valve: The mitral valve is grossly normal. Trivial mitral valve regurgitation. Tricuspid Valve: The tricuspid valve is grossly normal. Tricuspid valve regurgitation is mild. Aortic Valve: The aortic valve is tricuspid. Aortic valve regurgitation is not visualized. Pulmonic Valve: The pulmonic valve was grossly  normal. Pulmonic valve regurgitation is trivial. Aorta: The aortic root is normal in size and structure. Venous: The inferior vena cava is normal in size with greater than 50% respiratory variability, suggesting right atrial pressure of 3 mmHg. IAS/Shunts: No atrial level shunt detected by color flow Doppler.  LEFT VENTRICLE PLAX 2D LVIDd:         4.30 cm  Diastology LVIDs:         2.68 cm  LV e' lateral:   6.42 cm/s LV PW:         0.82 cm  LV E/e' lateral: 11.1 LV IVS:        0.98 cm  LV e' medial:    5.11 cm/s LVOT diam:     2.20 cm  LV E/e' medial:  13.9 LV SV:         81 LV SV Index:   44 LVOT Area:     3.80 cm  RIGHT VENTRICLE RV Basal diam:  2.99 cm RV S prime:     21.60 cm/s TAPSE (M-mode): 3.1 cm LEFT ATRIUM             Index       RIGHT ATRIUM           Index LA diam:        3.50 cm 1.90 cm/m  RA Area:     14.20 cm LA Vol (A2C):   42.8 ml 23.21 ml/m RA Volume:   31.20 ml  16.92 ml/m LA Vol (A4C):   46.7 ml 25.33 ml/m LA Biplane Vol: 45.6 ml 24.73 ml/m  AORTIC VALVE LVOT Vmax:   124.00 cm/s LVOT Vmean:  72.900 cm/s LVOT VTI:    0.212 m  AORTA Ao Root diam: 3.00 cm MITRAL VALVE MV Area (PHT): 6.65 cm     SHUNTS MV Decel Time: 114 msec     Systemic VTI:  0.21 m MV E velocity: 71.10 cm/s   Systemic Diam: 2.20 cm MV A velocity: 113.00 cm/s MV E/A ratio:  0.63 Rozann Lesches MD Electronically signed by Rozann Lesches MD Signature Date/Time: 05/11/2020/4:35:39 PM    Final     Orson Eva, DO  [facility]  If 7PM-7AM, please contact night-coverage [email or web address] Password [email or web address] 05/14/2020, 3:34 PM   LOS: 3 days

## 2020-05-14 NOTE — Progress Notes (Signed)
Modified Barium Swallow Progress Note  Patient Details  Name: Evan Chavez MRN: [id number] Date of Birth: 20-Dec-1960  Today's Date: 05/14/2020  Modified Barium Swallow completed.  Full report located under Chart Review in the Imaging Section.  Brief recommendations include the following:  Clinical Impression  Pt presents with mild oropharyngeal phase dysphagia characterized by lingual pumping with reduced bolus cohesiveness and premature spillage of liquids to the valleculae. Hyolaryngeal excursion appeared WNL. Pt without penetration/aspiration across consistencies/textures and only with trace amounts of thin liquid residuals along base of tongue and in lateral channels which cleared with secondary swallow. Pt is currently on 4L O2 via nasal cannula. Risk for aspiration with po intake appears minimal, however his respiratory status slightly increases his risk. Pt also with history of esophageal dysphagia. Recommend regular textures and thin liquids with standard aspiration and reflux precautions. No further SLP services indicated at this time. This study was reviewed with Dr. Elsworth Soho (pulmonary).    Swallow Evaluation Recommendations       SLP Diet Recommendations: Regular solids;Thin liquid   Liquid Administration via: Cup;Straw   Medication Administration: Whole meds with liquid   Supervision: Patient able to self feed   Compensations: Slow rate;Small sips/bites   Postural Changes: Seated upright at 90 degrees;Remain semi-upright after after feeds/meals (Comment)   Oral Care Recommendations: Oral care BID   Other Recommendations: Clarify dietary restrictions   Thank you,  Genene Churn, [place] 05/14/2020,2:14 PM

## 2020-05-14 NOTE — Consult Note (Signed)
Name: Evan Chavez MRN: [id number] DOB: November 27, 1960    ADMISSION DATE:  05/10/2020 CONSULTATION DATE:  05/14/2020   REFERRING MD :  Tat, [facility] MD  CHIEF COMPLAINT:  Pneumonia, effusions  BRIEF PATIENT DESCRIPTION: 59 year old man with Barrett's esophagus and esophageal stricture admitted with bibasilar pneumonia and loculated effusions.  Similar admission 08/2018 Developed abdominal pain and found left hydronephrosis with ureteral obstruction  SIGNIFICANT EVENTS    STUDIES:   EGD 12/2018 esophageal peptic  Stricture , dilated , Barrett'sesophagus   CT angio 08/2018 >> bibasal pneumonia Esophagram 08/2018 >> esophagitis. Focal area of relative narrowing of the esophagus is identified estimated over 5 cm length  Diffusely impaired esophageal motility.  Laryngeal penetration without gross aspiration  Ct angio chest 7/29 >>Bilateral pleural effusions and lower lobe consolidation  Diffuse wall thickening throughout the esophagus -stable from a prior exam of 2019. Lt hnosis  CT abd/pelvis W con 8/1 >> worsening of LEFT lower lobe airspace disease that may represent a combination of volume loss and pneumonitis Loculated bilateral basilar effusions with similar appearance  HISTORY OF PRESENT ILLNESS: He was hospitalized November 2019 with bibasal pneumonia and imaging showed esophageal stricture and impaired esophageal motility. EGD confirmed severe reflux esophagitis with peptic ulcer disease with extensive gastric erosions and duodenal ulcer.  Biopsy revealed Barrett's esophagus. He had a repeat EGD 12/2018 with balloon dilatation for peptic stricture, his dysphagia improved and surveillance EGD was advised  He presented to the ED 7/28 with generalized weakness, productive cough and low-grade fever.  Chest x-ray showed bibasilar pneumonia, treated with amoxicillin and doxycycline.  He returned to the ED 7/29 due to increased shortness of breath, labs showed leukocytosis mild  lactic acidosis.  CT angiogram of the chest showed bibasal consolidation and pleural effusions .  He was treated with ceftriaxone and azithromycin.  Developed abdominal pain and CT abdomen was performed which showed that the left lower lobe consolidation was slightly worse, effusions were stable .  PCCM consulted.  I note a fever of 100.6 on 8/1 but has since defervesced.  He was on room air and is now requiring 3 L nasal cannula. urology consultation noted, elective ESWL is planned  PAST MEDICAL HISTORY :   has a past medical history of Cocaine abuse ([place]), ETOH abuse, GERD (gastroesophageal reflux disease), Hyperlipidemia, and Stroke (cerebrum) ([place]) (06/2018).  has a past surgical history that includes Esophagogastroduodenoscopy (egd) with propofol (N/A, 08/25/2018); biopsy (08/25/2018); Esophagogastroduodenoscopy (egd) with propofol (N/A, 12/13/2018); biopsy (12/13/2018); Colonoscopy with propofol (N/A, 04/07/2019); and polypectomy (04/07/2019). Prior to Admission medications   Medication Sig Start Date End Date Taking? Authorizing Provider  acetaminophen (TYLENOL) 500 MG tablet Take 1,000 mg by mouth every 6 (six) hours as needed (for pain).   Yes [provider]  amantadine (SYMMETREL) 100 MG capsule Take 100 mg by mouth 2 (two) times daily.    Yes [provider]  amoxicillin (AMOXIL) 500 MG capsule Take 2 capsules (1,000 mg total) by mouth 3 (three) times daily. 05/09/20  Yes Fransico Meadow, PA-C  aspirin 325 MG tablet Take 1 tablet (325 mg total) by mouth daily. 07/16/18  Yes Barton Dubois, MD  atorvastatin (LIPITOR) 40 MG tablet Take 40 mg by mouth daily.   Yes [provider]  benzonatate (TESSALON) 100 MG capsule Take 100 mg by mouth 3 (three) times daily. 05/02/20  Yes [provider]  diltiazem (CARDIZEM CD) 120 MG 24 hr capsule Take 1 capsule (120 mg total) by mouth daily. Patient  taking differently: Take 120 mg by mouth daily after breakfast.  09/22/18   Yes Soyla Dryer, PA-C  doxycycline (VIBRA-TABS) 100 MG tablet Take 1 tablet (100 mg total) by mouth 2 (two) times daily. 05/09/20  Yes Fransico Meadow, PA-C  Ferrous Sulfate (IRON PO) Take 65 mg by mouth 2 (two) times daily with a meal.   Yes [provider]  loratadine (CLARITIN) 10 MG tablet Take 10 mg by mouth daily. 05/02/20  Yes [provider]  Multiple Vitamin (MULTIVITAMIN WITH MINERALS) TABS tablet Take 1 tablet by mouth daily after breakfast.    Yes [provider]  pantoprazole (PROTONIX) 40 MG tablet Take 1 tablet (40 mg total) by mouth 2 (two) times daily before a meal. 09/22/18  Yes Soyla Dryer, PA-C  sulfamethoxazole-trimethoprim (BACTRIM DS) 800-160 MG tablet Take 1 tablet by mouth 2 (two) times daily. 5 day course prescribed on 05/04/2020 05/04/20  Yes [provider]  tamsulosin (FLOMAX) 0.4 MG CAPS capsule Take 0.4 mg by mouth daily. 05/04/20  Yes [provider]  diltiazem (CARDIZEM) 120 MG tablet Take 120 mg by mouth daily. Patient not taking: Reported on 05/11/2020 05/02/20   [provider]   No Known Allergies  FAMILY HISTORY:  family history includes GER disease in his mother; Heart attack in his father; Heart disease in his father; Hypertension in his father and mother; Thyroid disease in his mother. SOCIAL HISTORY:  reports that he quit smoking about 23 months ago. His smoking use included cigarettes. He has a 40.00 pack-year smoking history. He has never used smokeless tobacco. He reports previous alcohol use of about 12.0 standard drinks of alcohol per week. He reports previous drug use. Drug: Cocaine.  REVIEW OF SYSTEMS:   Constitutional: Negative for chills, weight loss, malaise/fatigue and diaphoresis. Positive fever low-grade HENT: Negative for hearing loss, ear pain, nosebleeds, congestion, sore throat, neck pain, tinnitus and ear discharge.   Eyes: Negative for blurred vision, double vision, photophobia,  pain, discharge and redness.  Respiratory: Negative for hemoptysis, sputum production,  wheezing and stridor.   Cardiovascular: Negative for chest pain, palpitations, orthopnea, claudication, leg swelling and PND.  Gastrointestinal: Negative for heartburn, nausea, vomiting, abdominal pain, diarrhea, constipation, blood in stool and melena.  Genitourinary: Negative for dysuria, urgency, frequency, hematuria , flank pain has subsided.  Musculoskeletal: Negative for myalgias, back pain, joint pain and falls.  Skin: Negative for itching and rash.  Neurological: Negative for dizziness, tingling, tremors, sensory change, speech change, focal weakness, seizures, loss of consciousness, weakness and headaches.  Endo/Heme/Allergies: Negative for environmental allergies and polydipsia. Does not bruise/bleed easily.  SUBJECTIVE:   VITAL SIGNS: Temp:  [98.6 F (37 C)-100.6 F (38.1 C)] 99 F (37.2 C) (08/01 1817) Pulse Rate:  [106-120] 110 (08/02 0516) Resp:  [16-20] 18 (08/02 0516) BP: (129-146)/(80-86) 129/80 (08/02 0516) SpO2:  [90 %-100 %] 90 % (08/02 0803)  PHYSICAL EXAMINATION: General: Middle-aged man, in a stretcher, comfortable on nasal cannula, no distress Neuro: Left facial weakness, good strength in all extremities HEENT: No pallor, icterus, no JVD Cardiovascular: S1-S2 regular Lungs: Decreased breath sounds bilateral, no crackles or rhonchi Abdomen: Soft, nontender Musculoskeletal: No deformity Skin: No rash  Recent Labs  Lab 05/12/20 1824 05/13/20 0633 05/14/20 0604  NA 132* 137 137  K 3.3* 3.5 3.7  CL 101 103 102  CO2 23 23 25   BUN 14 10 9   CREATININE 0.91 0.84 0.84  GLUCOSE 149* 111* 128*   Recent Labs  Lab 05/12/20  1824 05/13/20 0633 05/14/20 0604  HGB 8.0* 8.0* 9.1*  HCT 25.4* 26.4* 29.5*  WBC 6.8 7.5 12.7*  PLT 179 195 287   CT ABDOMEN PELVIS W CONTRAST  Result Date: 05/13/2020 CLINICAL DATA:  LEFT renal mass. Suspicion for renal mass on prior chest CT.  EXAM: CT ABDOMEN AND PELVIS WITH CONTRAST TECHNIQUE: Multidetector CT imaging of the abdomen and pelvis was performed using the standard protocol following bolus administration of intravenous contrast. CONTRAST:  166mL OMNIPAQUE IOHEXOL 300 MG/ML  SOLN COMPARISON:  Chest CT of May 10, 2020 FINDINGS: Lower chest: Loculated bilateral basilar effusions with similar appearance when compared to the study of May 10, 2020. Basilar airspace disease with increasing airspace disease particularly at the LEFT lung base. Hiatal hernia and patulous distal esophagus. Calcified coronary artery disease partially imaged. No pericardial effusion. Hepatobiliary: Liver without focal, suspicious lesion. Portal vein is patent. No pericholecystic stranding. No biliary duct dilation. Pancreas: Pancreas is normal without peripancreatic stranding, ductal dilation or inflammation. Spleen: Spleen normal in size and contour. Adrenals/Urinary Tract: Adrenal glands are normal. Moderate to marked LEFT-sided hydroureteronephrosis with overlying cortical thinning. Large calculus in the proximal LEFT ureter measuring 9 x 8 x 8 mm. No perinephric abscess. Urinary bladder is normal. Normal enhancement of the RIGHT kidney. Enhancement of the kidneys despite scarring and dilation is relatively symmetric and there is an additional small calculus to moderate sized calculus in the lower pole the LEFT kidney. Area with limited assessment due to motion artifact. Stomach/Bowel: Hiatal hernia and some distal esophageal thickening as described. Small duodenal diverticulum in the second portion of the duodenum. No acute gastrointestinal process. Appendix not visualized. Motion artifact in this area limiting assessment but no gross findings of inflammation that would indicate pericecal stranding or secondary signs of acute appendicitis. Colonic interposition along the anterior portion of the liver. No pericolonic stranding. No perirectal stranding.  Vascular/Lymphatic: Calcific and noncalcific atheromatous plaque of the abdominal aorta. No aneurysmal dilation. No adenopathy in the retroperitoneum or upper abdomen. No pelvic lymphadenopathy. Reproductive: Heterogeneous prostate. Calcifications of the vas deferens. Other: No ascites. Musculoskeletal: No acute or destructive bone finding. Spinal degenerative changes. IMPRESSION: 1. Moderate to marked hydronephrosis due to calculus in the proximal to mid LEFT ureter. Urologic consultation and correlation with urine studies to exclude concomitant infection is suggested. 2. Cortical thinning that is seen on the LEFT and preservation of renal enhancement suggests that an element of this process could potentially be chronic. 3. Loculated effusions and worsening of LEFT lower lobe airspace disease that may represent a combination of volume loss and pneumonitis 4. Loculated bilateral basilar effusions with similar appearance when compared to the study of May 10, 2020. Basilar airspace disease with increasing airspace disease particularly at the LEFT lung base. Findings may represent worsening pneumonia or aspiration. 5. Hiatal hernia and patulous distal esophagus. 6. Aortic atherosclerosis. Aortic Atherosclerosis (ICD10-I70.0). Electronically Signed   By: Zetta Bills M.D.   On: 05/13/2020 12:52    ASSESSMENT / PLAN:  Bibasilar pneumonia Small loculated pleural effusions ?  Underlying COPD  -Favor aspiration given his longstanding history with reflux esophagitis and esophageal stricture , although he cleared swallow evaluation -CT abdomen showed slight worsening of left lower lobe consolidation but effusions are stable compared to 2 days ago -New fever and mild leukocytosis with ureteral stone and hydronephrosis but urinalysis appears bland  Strep antigen and Legionella negative  Recommend Ultrasound-guided thoracentesis to define parapneumonic effusion, left appears to be larger than the right Can  discontinue azithromycin in 5 days, continue ceftriaxone and complete course with Augmentin for a total of 14 days, await pleural fluid results  Kara Mead MD. FCCP. [facility]  If no response to pager , please call [phone]    05/14/2020, 12:12 PM

## 2020-05-14 NOTE — Progress Notes (Signed)
Report called to [facility] in ICU

## 2020-05-15 DIAGNOSIS — J939 Pneumothorax, unspecified: Secondary | ICD-10-CM | POA: Diagnosis not present

## 2020-05-15 DIAGNOSIS — J9601 Acute respiratory failure with hypoxia: Secondary | ICD-10-CM | POA: Diagnosis present

## 2020-05-15 LAB — BASIC METABOLIC PANEL
Anion gap: 11 (ref 5–15)
BUN: 10 mg/dL (ref 6–20)
CO2: 24 mmol/L (ref 22–32)
Calcium: 8.3 mg/dL — ABNORMAL LOW (ref 8.9–10.3)
Chloride: 101 mmol/L (ref 98–111)
Creatinine, Ser: 1.02 mg/dL (ref 0.61–1.24)
GFR calc Af Amer: >60 mL/min (ref >60)
GFR calc non Af Amer: >60 mL/min (ref >60)
Glucose, Bld: 114 mg/dL — ABNORMAL HIGH (ref 70–99)
Potassium: 3.8 mmol/L (ref 3.5–5.1)
Sodium: 136 mmol/L (ref 135–145)

## 2020-05-15 LAB — CULTURE, BLOOD (ROUTINE X 2)
Culture: NO GROWTH
Culture: NO GROWTH
Special Requests: ADEQUATE
Special Requests: ADEQUATE

## 2020-05-15 LAB — CBC
HCT: 29.7 % — ABNORMAL LOW (ref 39.0–52.0)
Hemoglobin: 9.2 g/dL — ABNORMAL LOW (ref 13.0–17.0)
MCH: 27.7 pg (ref 26.0–34.0)
MCHC: 31 g/dL (ref 30.0–36.0)
MCV: 89.5 fL (ref 80.0–100.0)
Platelets: 354 10*3/uL (ref 150–400)
RBC: 3.32 MIL/uL — ABNORMAL LOW (ref 4.22–5.81)
RDW: 13.4 % (ref 11.5–15.5)
WBC: 16 10*3/uL — ABNORMAL HIGH (ref 4.0–10.5)
nRBC: 0.1 % (ref 0.0–0.2)

## 2020-05-15 LAB — PATHOLOGIST SMEAR REVIEW

## 2020-05-15 LAB — PROCALCITONIN: Procalcitonin: 1.29 ng/mL

## 2020-05-15 LAB — GLUCOSE, BODY FLUID OTHER: Glucose, Body Fluid Other: 2 mg/dL

## 2020-05-15 MED ORDER — PIPERACILLIN-TAZOBACTAM 3.375 G IVPB
3.3750 g | Freq: Once | INTRAVENOUS | Status: AC
  Administered 2020-05-15: 3.375 g via INTRAVENOUS
  Filled 2020-05-15: qty 50

## 2020-05-15 MED ORDER — PIPERACILLIN-TAZOBACTAM 3.375 G IVPB
3.3750 g | Freq: Three times a day (TID) | INTRAVENOUS | Status: DC
  Administered 2020-05-15 – 2020-05-30 (×44): 3.375 g via INTRAVENOUS
  Filled 2020-05-15 (×46): qty 50

## 2020-05-15 MED ORDER — METOPROLOL TARTRATE 5 MG/5ML IV SOLN
2.5000 mg | Freq: Four times a day (QID) | INTRAVENOUS | Status: DC
  Administered 2020-05-15 – 2020-05-16 (×6): 2.5 mg via INTRAVENOUS
  Filled 2020-05-15 (×6): qty 5

## 2020-05-15 MED ORDER — HEPARIN SODIUM (PORCINE) 5000 UNIT/ML IJ SOLN
5000.0000 [IU] | Freq: Three times a day (TID) | INTRAMUSCULAR | Status: DC
  Administered 2020-05-15 – 2020-05-17 (×6): 5000 [IU] via SUBCUTANEOUS
  Filled 2020-05-15 (×6): qty 1

## 2020-05-15 MED ORDER — CHLORHEXIDINE GLUCONATE CLOTH 2 % EX PADS
6.0000 | MEDICATED_PAD | Freq: Every day | CUTANEOUS | Status: DC
  Administered 2020-05-15 – 2020-05-19 (×6): 6 via TOPICAL

## 2020-05-15 NOTE — Progress Notes (Signed)
     [address]       [place] [address]             [phone]       Case discussed with Dr. Elsworth Soho Images reviewed  Pt will require a R VATS decortication.  Awaiting transfer to [name] for surgery.  Evan Chavez

## 2020-05-15 NOTE — Progress Notes (Signed)
Pharmacy Antibiotic Note  Evan Chavez is a 59 y.o. male admitted on 05/10/2020 with loculated effusion.  Pharmacy has been consulted for Zosyn dosing.  Plan: Zosyn 3.375g IV q8h (4 hour infusion).  Monitor labs, c/s, and patient improvement.  Weight: 67.5 kg (148 lb 13 oz)  Temp (24hrs), Avg:99 F (37.2 C), Min:98.2 F (36.8 C), Max:99.8 F (37.7 C)  Recent Labs  Lab 05/10/20 2111 05/10/20 2111 05/10/20 2232 05/11/20 0536 05/12/20 1824 05/13/20 0633 05/14/20 0604 05/15/20 0331  WBC 14.6*   < >  --  12.9* 6.8 7.5 12.7* 16.0*  CREATININE 1.03   < >  --  0.93 0.91 0.84 0.84 1.02  LATICACIDVEN 2.4*  --  1.9  --   --   --   --   --    < > = values in this interval not displayed.    Estimated Creatinine Clearance: 74.4 mL/min (by C-G formula based on SCr of 1.02 mg/dL).    No Known Allergies  Antimicrobials this admission: Zosyn 8/3 >>  CTX/Azith 7/30 >> 8/3  Dose adjustments this admission: N/A  Microbiology results: 7/29 BCx: ngtd 7/30 UCx: ng  8/2 Pleural Cx: pending    Thank you for allowing pharmacy to be a part of this patient's care.  Ramond Craver 05/15/2020 9:45 AM

## 2020-05-15 NOTE — Progress Notes (Signed)
Name: Evan Chavez MRN: [id number] DOB: [birth year]    ADMISSION DATE:  05/10/2020 CONSULTATION DATE:  05/15/2020   REFERRING MD :  Tat, triad MD  CHIEF COMPLAINT:  Pneumonia, effusions  BRIEF PATIENT DESCRIPTION: 59 year old man with Barrett's esophagus and esophageal stricture admitted with bibasilar pneumonia and loculated effusions.  Similar admission for bibasal pna 08/2018 Developed abdominal pain and found left hydronephrosis with ureteral obstruction   He was hospitalized November 2019 with bibasal pneumonia and imaging showed esophageal stricture and impaired esophageal motility. EGD confirmed severe reflux esophagitis with peptic ulcer disease with extensive gastric erosions and duodenal ulcer.  Biopsy revealed Barrett's esophagus. He had a repeat EGD 12/2018 with balloon dilatation for peptic stricture, his dysphagia improved and surveillance EGD was advised  SIGNIFICANT EVENTS  8/2 US guided LT thora >> apical pneumothorax  STUDIES:   EGD 12/2018 esophageal peptic  Stricture , dilated , Barrett'sesophagus   CT angio 08/2018 >> bibasal pneumonia Esophagram 08/2018 >> esophagitis. Focal area of relative narrowing of the esophagus is identified estimated over 5 cm length  Diffusely impaired esophageal motility.  Laryngeal penetration without gross aspiration  Ct angio chest 7/29 >>Bilateral pleural effusions and lower lobe consolidation  Diffuse wall thickening throughout the esophagus -stable from a prior exam of 2019. Lt hnosis  CT abd/pelvis W con 8/1 >> worsening of LEFT lower lobe airspace disease that may represent a combination of volume loss and pneumonitis Loculated bilateral basilar effusions with similar appearance       PAST MEDICAL HISTORY :   has a past medical history of Cocaine abuse ([place]), ETOH abuse, GERD (gastroesophageal reflux disease), Hyperlipidemia, and Stroke (cerebrum) ([place]) (06/2018).  has a past surgical history that includes  Esophagogastroduodenoscopy (egd) with propofol (N/A, 08/25/2018); biopsy (08/25/2018); Esophagogastroduodenoscopy (egd) with propofol (N/A, 12/13/2018); biopsy (12/13/2018); Colonoscopy with propofol (N/A, 04/07/2019); and polypectomy (04/07/2019).     SUBJECTIVE:   Afebrile Saturation 93% on room air Able to speak in full sentences Denies pain   VITAL SIGNS: Temp:  [98.2 F (36.8 C)-99.8 F (37.7 C)] 98.9 F (37.2 C) (08/03 0739) Pulse Rate:  [109-131] 127 (08/03 0700) Resp:  [18-38] 37 (08/03 0700) BP: (126-168)/(77-93) 133/77 (08/03 0700) SpO2:  [87 %-98 %] 97 % (08/03 0700) FiO2 (%):  [8 %] 8 % (08/03 0818) Weight:  [67.5 kg] 67.5 kg (08/03 0500)  PHYSICAL EXAMINATION: General: Middle-aged man, , no distress Neuro: Left facial weakness, good strength in all extremities HEENT: No pallor, icterus, no JVD Cardiovascular: S1-S2 regular Lungs: Decreased breath sounds bilateral, no crackles or rhonchi , no accessory muscle use Abdomen: Soft, nontender Musculoskeletal: No deformity Skin: No rash  Recent Labs  Lab 05/13/20 0633 05/14/20 0604 05/15/20 0331  NA 137 137 136  K 3.5 3.7 3.8  CL 103 102 101  CO2 23 25 24   BUN 10 9 10   CREATININE 0.84 0.84 1.02  GLUCOSE 111* 128* 114*   Recent Labs  Lab 05/13/20 0633 05/14/20 0604 05/15/20 0331  HGB 8.0* 9.1* 9.2*  HCT 26.4* 29.5* 29.7*  WBC 7.5 12.7* 16.0*  PLT 195 287 354   DG Chest 1 View  Result Date: 05/14/2020 CLINICAL DATA:  Complicated RIGHT pleural effusion post diagnostic thoracentesis EXAM: CHEST  1 VIEW COMPARISON:  05/10/2020 FINDINGS: Enlargement of cardiac silhouette. Mediastinal contours and pulmonary vascularity normal. Persistent RIGHT pleural effusion and significant atelectasis and/or consolidation of the mid to lower RIGHT lung. New lucency at the RIGHT apex, suspect representing apical pneumothorax. No mediastinal shift.  Patient asymptomatic. Persistent infiltrate LEFT lower lobe and tiny LEFT pleural  effusion. IMPRESSION: Probable RIGHT apical pneumothorax post diagnostic thoracentesis. Persistent complicated partially loculated RIGHT pleural effusion, RIGHT basilar atelectasis versus consolidation, and LEFT lower lobe consolidation. Patient asymptomatic. Findings discussed with Dr. Elsworth Soho on 05/14/2020 at 1545 hours. Electronically Signed   By: Lavonia Dana M.D.   On: 05/14/2020 15:51   CT ABDOMEN PELVIS W CONTRAST  Result Date: 05/13/2020 CLINICAL DATA:  LEFT renal mass. Suspicion for renal mass on prior chest CT. EXAM: CT ABDOMEN AND PELVIS WITH CONTRAST TECHNIQUE: Multidetector CT imaging of the abdomen and pelvis was performed using the standard protocol following bolus administration of intravenous contrast. CONTRAST:  125mL OMNIPAQUE IOHEXOL 300 MG/ML  SOLN COMPARISON:  Chest CT of May 10, 2020 FINDINGS: Lower chest: Loculated bilateral basilar effusions with similar appearance when compared to the study of May 10, 2020. Basilar airspace disease with increasing airspace disease particularly at the LEFT lung base. Hiatal hernia and patulous distal esophagus. Calcified coronary artery disease partially imaged. No pericardial effusion. Hepatobiliary: Liver without focal, suspicious lesion. Portal vein is patent. No pericholecystic stranding. No biliary duct dilation. Pancreas: Pancreas is normal without peripancreatic stranding, ductal dilation or inflammation. Spleen: Spleen normal in size and contour. Adrenals/Urinary Tract: Adrenal glands are normal. Moderate to marked LEFT-sided hydroureteronephrosis with overlying cortical thinning. Large calculus in the proximal LEFT ureter measuring 9 x 8 x 8 mm. No perinephric abscess. Urinary bladder is normal. Normal enhancement of the RIGHT kidney. Enhancement of the kidneys despite scarring and dilation is relatively symmetric and there is an additional small calculus to moderate sized calculus in the lower pole the LEFT kidney. Area with limited assessment due  to motion artifact. Stomach/Bowel: Hiatal hernia and some distal esophageal thickening as described. Small duodenal diverticulum in the second portion of the duodenum. No acute gastrointestinal process. Appendix not visualized. Motion artifact in this area limiting assessment but no gross findings of inflammation that would indicate pericecal stranding or secondary signs of acute appendicitis. Colonic interposition along the anterior portion of the liver. No pericolonic stranding. No perirectal stranding. Vascular/Lymphatic: Calcific and noncalcific atheromatous plaque of the abdominal aorta. No aneurysmal dilation. No adenopathy in the retroperitoneum or upper abdomen. No pelvic lymphadenopathy. Reproductive: Heterogeneous prostate. Calcifications of the vas deferens. Other: No ascites. Musculoskeletal: No acute or destructive bone finding. Spinal degenerative changes. IMPRESSION: 1. Moderate to marked hydronephrosis due to calculus in the proximal to mid LEFT ureter. Urologic consultation and correlation with urine studies to exclude concomitant infection is suggested. 2. Cortical thinning that is seen on the LEFT and preservation of renal enhancement suggests that an element of this process could potentially be chronic. 3. Loculated effusions and worsening of LEFT lower lobe airspace disease that may represent a combination of volume loss and pneumonitis 4. Loculated bilateral basilar effusions with similar appearance when compared to the study of May 10, 2020. Basilar airspace disease with increasing airspace disease particularly at the LEFT lung base. Findings may represent worsening pneumonia or aspiration. 5. Hiatal hernia and patulous distal esophagus. 6. Aortic atherosclerosis. Aortic Atherosclerosis (ICD10-I70.0). Electronically Signed   By: Zetta Bills M.D.   On: 05/13/2020 12:52   DG Chest Port 1V same Day  Result Date: 05/14/2020 CLINICAL DATA:  Follow-up right-sided pneumothorax. EXAM: PORTABLE  CHEST 1 VIEW COMPARISON:  May 14, 2020 (3:02 p.m.) FINDINGS: Stable moderate to marked severity atelectasis and/or infiltrate is noted within the right lung base with stable mild to moderate severity left  basilar atelectasis and/or infiltrate seen. There is a small, stable left pleural effusion with a stable moderate sized right pleural effusion. A stable moderate sized right apical pneumothorax is seen. A mild amount of radiopaque contrast is again seen along the infrahilar region on the right. The heart size and mediastinal contours are within normal limits. The visualized skeletal structures are unremarkable. IMPRESSION: No significant interval change when compared to the prior study dated May 14, 2020 (3:02 p.m.) Electronically Signed   By: Virgina Norfolk M.D.   On: 05/14/2020 19:27   DG Swallowing Func-Speech Pathology  Result Date: 05/14/2020 Objective Swallowing Evaluation: Type of Study: MBS-Modified Barium Swallow Study  Patient Details Name: Saifullah Jolley MRN: [id number] Date of Birth: [birth year] Today's Date: 05/14/2020 Time: SLP Start Time (ACUTE ONLY): 55 -SLP Stop Time (ACUTE ONLY): 1300 SLP Time Calculation (min) (ACUTE ONLY): 30 min Past Medical History: Past Medical History: Diagnosis Date . Cocaine abuse ([place])  . ETOH abuse  . GERD (gastroesophageal reflux disease)  . Hyperlipidemia  . Stroke (cerebrum) ([place]) 06/2018  right sided weakness Past Surgical History: Past Surgical History: Procedure Laterality Date . BIOPSY  08/25/2018  Procedure: BIOPSY;  Surgeon: [name], MD;  [facility];  Service: Endoscopy;; . BIOPSY  12/13/2018  Procedure: BIOPSY;  Surgeon: [name], MD;  Location: [facility];  Service: Endoscopy;;  esophagus . COLONOSCOPY WITH PROPOFOL N/A 04/07/2019  Dr. [name]: 3 Tubular adenomas removed (4 to 8 mm in size), next colonoscopy 3 years. . ESOPHAGOGASTRODUODENOSCOPY (EGD) WITH PROPOFOL N/A 08/25/2018  Dr. [name]: Severely inflamed abnormal  appearing mid/distal esophagus.  Encroachment on lumen suspicious for infiltrating neoplasm located be all from severe benign inflammation, extensive gastric erosions and extensive duodenal ulcerations.  Query ischemic process versus other.  Biopsy revealed Barrett's esophagus but negative for malignancy and H. pylori. . ESOPHAGOGASTRODUODENOSCOPY (EGD) WITH PROPOFOL N/A 12/13/2018  Dr. [name]: peptic stricture s/p balloon dilatation. abnormal esophageal mucosa, by c/w barrett's without dysplasia, previous PUD completely healed.  Marland Kitchen POLYPECTOMY  04/07/2019  Procedure: POLYPECTOMY;  Surgeon: [name], MD;  Location: [facility];  Service: Endoscopy;;  colon HPI: 59 y.o. male with medical history significant for prior stroke, hyperlipidemia, GERD, BPH, barrett's esophagus    and remote history of cocaine abuse who presents to the emergency department due to 5 days of weakness, complains of productive cough with clear phlegm with occasional black speckles with associated reproducible midsternal chest pain. Patient was seen in the ED 2 days ago (7/28) due to similar symptoms, during which he reported a max temperature of 100F.  Chest x-ray done at that time showed pneumonia and patient was treated with IV fluid.  Amoxicillin and doxycycline were prescribed on the was advised to follow-up with his primary care physician.  Patient returned to the ED yesterday due to increased heart rate and sob.  He states that he was taking his blood pressure medication as prescribed and denies cocaine use. Bilateral pleural effusions and lower lobe consolidation similar to that seen on recent chest x-ray. This is consistent with bibasilar pneumonia and reactive effusions. Diffuse wall thickening throughout the esophagus likely related to reflux esophagitis. Clinical correlation is recommended. It should be noted this is stable from a prior exam of 2019.  Subjective: "My chest hurts when I am coughing sometimes." Assessment / Plan /  Recommendation CHL IP CLINICAL IMPRESSIONS 05/14/2020 Clinical Impression Pt presents with mild oropharyngeal phase dysphagia characterized by lingual pumping with reduced bolus cohesiveness  and premature spillage of liquids to the valleculae. Hyolaryngeal excursion appeared WNL. Pt without penetration/aspiration across consistencies/textures and only with trace amounts of thin liquid residuals along base of tongue and in lateral channels which cleared with secondary swallow. Pt is currently on 4L O2 via nasal cannula. Risk for aspiration with po intake appears minimal, however his respiratory status slightly increases his risk. Pt also with history of esophageal dysphagia. Recommend regular textures and thin liquids with standard aspiration and reflux precautions. No further SLP services indicated at this time. This study was reviewed with Dr. Elsworth Soho (pulmonary).  SLP Visit Diagnosis Dysphagia, oropharyngeal phase (R13.12) Attention and concentration deficit following -- Frontal lobe and executive function deficit following -- Impact on safety and function Mild aspiration risk   CHL IP TREATMENT RECOMMENDATION 05/14/2020 Treatment Recommendations No treatment recommended at this time   Prognosis 05/14/2020 Prognosis for Safe Diet Advancement Good Barriers to Reach Goals -- Barriers/Prognosis Comment -- CHL IP DIET RECOMMENDATION 05/14/2020 SLP Diet Recommendations Regular solids;Thin liquid Liquid Administration via Cup;Straw Medication Administration Whole meds with liquid Compensations Slow rate;Small sips/bites Postural Changes Seated upright at 90 degrees;Remain semi-upright after after feeds/meals (Comment)   CHL IP OTHER RECOMMENDATIONS 05/14/2020 Recommended Consults -- Oral Care Recommendations Oral care BID Other Recommendations Clarify dietary restrictions   CHL IP FOLLOW UP RECOMMENDATIONS 05/14/2020 Follow up Recommendations None   CHL IP FREQUENCY AND DURATION 05/14/2020 Speech Therapy Frequency (ACUTE ONLY) min  2x/week Treatment Duration 1 week      CHL IP ORAL PHASE 05/14/2020 Oral Phase Impaired Oral - Pudding Teaspoon -- Oral - Pudding Cup -- Oral - Honey Teaspoon -- Oral - Honey Cup -- Oral - Nectar Teaspoon -- Oral - Nectar Cup -- Oral - Nectar Straw -- Oral - Thin Teaspoon Premature spillage Oral - Thin Cup Premature spillage;Decreased bolus cohesion Oral - Thin Straw Premature spillage Oral - Puree Lingual pumping Oral - Mech Soft -- Oral - Regular Delayed oral transit Oral - Multi-Consistency -- Oral - Pill -- Oral Phase - Comment --  CHL IP PHARYNGEAL PHASE 05/14/2020 Pharyngeal Phase Impaired Pharyngeal- Pudding Teaspoon -- Pharyngeal -- Pharyngeal- Pudding Cup -- Pharyngeal -- Pharyngeal- Honey Teaspoon -- Pharyngeal -- Pharyngeal- Honey Cup -- Pharyngeal -- Pharyngeal- Nectar Teaspoon -- Pharyngeal -- Pharyngeal- Nectar Cup -- Pharyngeal -- Pharyngeal- Nectar Straw -- Pharyngeal -- Pharyngeal- Thin Teaspoon Delayed swallow initiation-vallecula Pharyngeal -- Pharyngeal- Thin Cup Delayed swallow initiation-vallecula;Lateral channel residue Pharyngeal -- Pharyngeal- Thin Straw Lateral channel residue;Delayed swallow initiation-vallecula Pharyngeal -- Pharyngeal- Puree Delayed swallow initiation-vallecula;WFL Pharyngeal -- Pharyngeal- Mechanical Soft -- Pharyngeal -- Pharyngeal- Regular WFL Pharyngeal -- Pharyngeal- Multi-consistency -- Pharyngeal -- Pharyngeal- Pill WFL Pharyngeal -- Pharyngeal Comment --  CHL IP CERVICAL ESOPHAGEAL PHASE 05/14/2020 Cervical Esophageal Phase WFL Pudding Teaspoon -- Pudding Cup -- Honey Teaspoon -- Honey Cup -- Nectar Teaspoon -- Nectar Cup -- Nectar Straw -- Thin Teaspoon -- Thin Cup -- Thin Straw -- Puree -- Mechanical Soft -- Regular -- Multi-consistency -- Pill -- Cervical Esophageal Comment -- Thank you, Genene Churn, Albert Lea PORTER,DABNEY 05/14/2020, 2:21 PM              US THORACENTESIS ASP PLEURAL SPACE W/IMG GUIDE  Result Date: 05/14/2020 INDICATION: Complicated  RIGHT pleural effusion EXAM: ULTRASOUND GUIDED DIAGNOSTIC RIGHT THORACENTESIS MEDICATIONS: None. COMPLICATIONS: Probable RIGHT pneumothorax, see chest radiograph report PROCEDURE: An ultrasound guided thoracentesis was thoroughly discussed with the patient and questions answered. The benefits, risks, alternatives and complications were also discussed. The patient understands and wishes to  proceed with the procedure. Written consent was obtained. Ultrasound was performed to localize and mark an adequate pocket of fluid in the posterior RIGHT chest. The area was then prepped and draped in the normal sterile fashion. 1% Lidocaine was used for local anesthesia. Under ultrasound guidance a 5 [place] Yueh catheter was introduced. Thoracentesis was performed. The catheter was removed and a dressing applied. FINDINGS: A total of approximately 30 mL of cloudy yellow slightly thicker RIGHT pleural fluid was removed. Samples were sent to the laboratory as requested by the clinical team. IMPRESSION: Successful ultrasound guided diagnostic RIGHT thoracentesis yielding 30 mL of cloudy yellow RIGHT pleural fluid, cannot exclude infection/empyema with this appearance. Findings discussed with Dr. Elsworth Soho on 05/14/2020 at 1545 hours. Electronically Signed   By: Lavonia Dana M.D.   On: 05/14/2020 15:50    ASSESSMENT / PLAN:  Bibasilar pneumonia RT moderate loculated pleural effusion, freely flowing effusion on left ?  Underlying COPD Right apical pneumothorax post thoracentesis, stable  -Favor aspiration given his longstanding history with reflux esophagitis and esophageal stricture , although he cleared swallow evaluation -Pleural fluid results reviewed, consistent with parapneumonic , await glucose, Gram stain negative, due to loculations >> complex effusion  Strep antigen and Legionella negative  Recommend Broaden antibiotics to Zosyn. Discussed with T CTS Dr. Kipp Brood, transferred to [facility] for VATS procedure.   Discussed with patient he is willing to proceed  He also has left hydronephrosis and needs ESWL , will defer to urology to coordinate this during his stay at [facility] MD. FCCP. [facility]  If no response to pager , please call [phone]    05/15/2020, 9:08 AM

## 2020-05-15 NOTE — Progress Notes (Addendum)
PROGRESS NOTE  Evan Chavez WVP:[id number] DOB: 02/04/1961 DOA: 05/10/2020 PCP: Rosita Fire, MD  Brief History:  59 y.o.malewith medical history significant forprior stroke, hyperlipidemia, GERD, BPH, barrett's esophagusand remote history of cocaine abuse who presents to the emergency department due to 5 days of weakness, complains of productive cough with clear phlegm with occasional black speckles with associated reproducible midsternal chest pain. Patientwas seen in the ED 2 days ago (7/28) due to similar symptoms, during which he reported a max temperature of 100F.Chest x-ray done at that time showed pneumonia and patient was treated with IV fluid. Amoxicillinand doxycycline wereprescribed on the was advised to follow-up with his primary care physician. Patient returnedto the ED yesterday due to increased heart rateand sob.He states that he was taking his blood pressure medication as prescribed and denies cocaine use.   ED Course: In the emergency department, he was tachypneic and tachycardic, BP was 157/106. Work-up in the ED showed leukocytosis, normocytic anemia, hyperglycemia, lactic acid 2.4>1.9, BNP146. SARS coronavirus 2 done on 7/28 was negative and repeated test done yesterday was negative as well. Chest x-ray showed unchanged symmetric bibasilar opacities suspected to be due to combination of pleural effusions and atelectasis/pneumonia. CT angiography ofchest with contrast showed no evidence of pulmonary emboli but showed bilateral pleural effusions and lower lobe consolidation similar to that seen on recent chest x-ray that is consistent with bibasilar pneumonia or reactive effusions.  It also showed an incidental finding of enlarged left kidney.  Subsequent CT abdomen/pelvis to clarify the abnormality showed that he had left hydronephrosis with a left proximal ureteral stone.  Urology was consulted.  After discussion with patient's family,  they did not feel the patient needed to have emergent urologic surgery.  The patient is now tentatively set up to have lithotripsy as an outpatient on 05/22/20.  This was verified again after I discussed the case with Dr. Irine Seal on 05/14/20.  Regarding his pneumonia, patient was started on ceftriaxone and azithromycin.  Initially improved.  However after 2 to 3 days, the patient began having increasing shortness of breath.  Pulmonary medicine was consulted.  Thoracocentesis was ordered.  Unfortunately, the patient developed a pneumothorax from thoracocentesis.  CT also showed loculated pleural effusions.  As result, pulmonary medicine recommended transfer to [facility] for VATS procedure.    Assessment/Plan: Sepsis -present on admission -presented with leukocytosis, elevated lactate, tachycardia and radiographic evidence of pneumonia -due to pneumonia -lactate peaked 2.4 -blood cultures remain neg -urine culture neg -UA--no pyuria  Lobar Pneumonia -7/29 CTA chest--no PE. Bilateral LL consolidation with pleural effusions -urine legionella and s.pneumoniae antigens neg -continue duoneb -continue mucinex -finish 5 days azithromycin -continue ceftriaxone   Loculated Parapneumonic effusions/Right Pneumothorax -05/11/20 echo--EF 65-70%, G1DD, trivial MR -now stable on RA, but remains sob -pulmonary consult appreciated-->thoracocentesis -suffered  R-pneumothorax after thora -05/14/20 pleural fluid--5578 WBC, exudative as expected -05/15/20 discussed with pulmonary--Dr. Alva-->transfer to [name] for VATS -Dr. Elsworth Soho already spoke with [facility]  Acute Respiratory Failure with Hypoxia -due to pneumonia and pneumothorax -now on 6L [place]  Left ureteral stone -05/11/20 CT abd--Moderate to marked hydronephrosis due to calculus in the proximal to mid LEFT ureter.Loculated bilateral basilar effusions with similar appearance when compared to the study of May 10, 2020 -consulted  urology--discussed with Dr. Hassan Rowan outpatient lithotripsy -05/14/20--case discussed with urology, Dr. Cecille Aver scheduled for lithotripsy in [place] on 05/22/20  Barrett's esophagus -reflux esophagitis hx which is contributing to esophageal wall  thickening -follows [facility] -increase protonix to bid -Repeat EGD in March 2020: Peptic stricture status post balloon dilatation. Abnormal esophageal mucosa biopsy, completely healed previously noted peptic ulcer disease, hiatal hernia. Biopsies with Barrett's esophagus without dysplasia or malignancy -Update EGD in 03/2022 for Barrett's esophagus. Will be due colonoscopy at the same time for tubular adenomas  Hyperlipidemia Continue Lipitor  Incidental finding of thyroid noduleby CT neck of your chest with contrast 64mm left thyroid nodule noted and this is probably stable from priorexamination from 2019 and likely benign     Status is: Inpatient  Remains inpatient appropriate because:IV treatments appropriate due to intensity of illness or inability to take PO -pneumothorax, now requires VATS   Dispo: The patient is from:Home Anticipated d/c is DJ:TTSV Anticipated d/c date is: >3 days Patient currently is not medically stable to d/c.        Family Communication:mother updated8/3/21  Consultants:urology  Code Status: FULL--confirmed with mother and patient  DVT Prophylaxis: Casey Heparin   Procedures: As Listed in Progress Note Above  Antibiotics: Ceftriaxone>>> azithro 7/29>>>8/2          Subjective:   Objective: Vitals:   05/15/20 0500 05/15/20 0600 05/15/20 0700 05/15/20 0739  BP: (!) 146/83 (!) 145/88 133/77   Pulse: (!) 111 (!) 112 (!) 127   Resp: (!) 30 (!) 28 (!) 37   Temp:    98.9 F (37.2 C)  TempSrc:    Oral  SpO2: 93% 96% 97%   Weight: 67.5 kg       Intake/Output Summary (Last 24 hours) at 05/15/2020  0856 Last data filed at 05/15/2020 0548 Gross per 24 hour  Intake 1137.71 ml  Output 1300 ml  Net -162.29 ml   Weight change:  Exam:   General:  Pt is alert, follows commands appropriately, not in acute distress  HEENT: No icterus, No thrush, No neck mass, [place]/AT  Cardiovascular: RRR, S1/S2, no rubs, no gallops  Respiratory:bilateral rhonchi  Abdomen: Soft/+BS, non tender, non distended, no guarding  Extremities: No edema, No lymphangitis, No petechiae, No rashes, no synovitis   Data Reviewed: I have personally reviewed following labs and imaging studies Basic Metabolic Panel: Recent Labs  Lab 05/11/20 0536 05/12/20 1824 05/13/20 0633 05/14/20 0604 05/15/20 0331  NA 135 132* 137 137 136  K 3.8 3.3* 3.5 3.7 3.8  CL 103 101 103 102 101  CO2 23 23 23 25 24   GLUCOSE 118* 149* 111* 128* 114*  BUN 18 14 10 9 10   CREATININE 0.93 0.91 0.84 0.84 1.02  CALCIUM 8.6* 8.3* 8.6* 8.5* 8.3*  MG 1.7  --   --   --   --   PHOS 2.5  --   --   --   --    Liver Function Tests: Recent Labs  Lab 05/09/20 1546 05/10/20 2111 05/11/20 0536  AST 14* 17 21  ALT 20 22 25   ALKPHOS 60 58 51  BILITOT 0.3 0.2* 0.4  PROT 7.7 7.4 6.5  ALBUMIN 4.1 3.8 3.2*   No results for input(s): LIPASE, AMYLASE in the last 168 hours. No results for input(s): AMMONIA in the last 168 hours. Coagulation Profile: Recent Labs  Lab 05/10/20 2111 05/11/20 0536  INR 1.1 1.1   CBC: Recent Labs  Lab 05/09/20 1546 05/09/20 1546 05/10/20 2111 05/10/20 2111 05/11/20 0536 05/12/20 1824 05/13/20 0633 05/14/20 0604 05/15/20 0331  WBC 17.8*   < > 14.6*   < > 12.9* 6.8 7.5 12.7* 16.0*  NEUTROABS  15.6*  --  12.2*  --   --   --   --   --   --   HGB 12.8*   < > 10.9*   < > 9.5* 8.0* 8.0* 9.1* 9.2*  HCT 41.6   < > 35.8*   < > 31.8* 25.4* 26.4* 29.5* 29.7*  MCV 88.9   < > 90.4   < > 92.2 89.8 88.9 88.1 89.5  PLT 210   < > 199   < > 159 179 195 287 354   < > = values in this interval not displayed.    Cardiac Enzymes: No results for input(s): CKTOTAL, CKMB, CKMBINDEX, TROPONINI in the last 168 hours. BNP: Invalid input(s): POCBNP CBG: No results for input(s): GLUCAP in the last 168 hours. HbA1C: No results for input(s): HGBA1C in the last 72 hours. Urine analysis:    Component Value Date/Time   COLORURINE YELLOW 05/11/2020 0910   APPEARANCEUR CLEAR 05/11/2020 0910   LABSPEC 1.035 (H) 05/11/2020 0910   PHURINE 7.0 05/11/2020 0910   GLUCOSEU NEGATIVE 05/11/2020 0910   HGBUR NEGATIVE 05/11/2020 0910   BILIRUBINUR NEGATIVE 05/11/2020 0910   BILIRUBINUR NEG 09/22/2018 1527   KETONESUR NEGATIVE 05/11/2020 0910   PROTEINUR 30 (A) 05/11/2020 0910   UROBILINOGEN 0.2 09/22/2018 1527   UROBILINOGEN 0.2 03/03/2012 0205   NITRITE NEGATIVE 05/11/2020 0910   LEUKOCYTESUR NEGATIVE 05/11/2020 0910   Sepsis Labs: @LABRCNTIP (procalcitonin:4,lacticidven:4) ) Recent Results (from the past 240 hour(s))  SARS Coronavirus 2 by RT PCR (hospital order, performed in [facility] hospital lab) Nasopharyngeal Nasopharyngeal Swab     Status: None   Collection Time: 05/09/20  4:08 PM   Specimen: Nasopharyngeal Swab  Result Value Ref Range Status   SARS Coronavirus 2 NEGATIVE NEGATIVE Final    Comment: (NOTE) SARS-CoV-2 target nucleic acids are NOT DETECTED.  The SARS-CoV-2 RNA is generally detectable in upper and lower respiratory specimens during the acute phase of infection. The lowest concentration of SARS-CoV-2 viral copies this assay can detect is 250 copies / mL. A negative result does not preclude SARS-CoV-2 infection and should not be used as the sole basis for treatment or other patient management decisions.  A negative result may occur with improper specimen collection / handling, submission of specimen other than nasopharyngeal swab, presence of viral mutation(s) within the areas targeted by this assay, and inadequate number of viral copies (<250 copies / mL). A negative result must  be combined with clinical observations, patient history, and epidemiological information.  Fact Sheet for Patients:   StrictlyIdeas.no  Fact Sheet for Healthcare Providers: BankingDealers.co.za  This test is not yet approved or  cleared by the [place] FDA and has been authorized for detection and/or diagnosis of SARS-CoV-2 by FDA under an Emergency Use Authorization (EUA).  This EUA will remain in effect (meaning this test can be used) for the duration of the COVID-19 declaration under Section 564(b)(1) of the Act, 21 U.S.C. section 360bbb-3(b)(1), unless the authorization is terminated or revoked sooner.  Performed at [facility], [address] [place], [place] [address]   Blood Culture (routine x 2)     Status: None   Collection Time: 05/10/20  9:11 PM   Specimen: BLOOD LEFT ARM  Result Value Ref Range Status   Specimen Description BLOOD LEFT ARM  Final   Special Requests   Final    BOTTLES DRAWN AEROBIC AND ANAEROBIC Blood Culture adequate volume   Culture   Final    NO GROWTH  5 DAYS Performed at [facility], [address], [place], [place] [address]    Report Status 05/15/2020 FINAL  Final  Blood Culture (routine x 2)     Status: None   Collection Time: 05/10/20 10:32 PM   Specimen: BLOOD  Result Value Ref Range Status   Specimen Description BLOOD LEFT ANTECUBITAL  Final   Special Requests   Final    BOTTLES DRAWN AEROBIC AND ANAEROBIC Blood Culture adequate volume   Culture   Final    NO GROWTH 5 DAYS Performed at [facility], [address], [place], [place] [address]    Report Status 05/15/2020 FINAL  Final  SARS Coronavirus 2 by RT PCR (hospital order, performed in [facility] hospital lab) Nasopharyngeal Nasopharyngeal Swab     Status: None   Collection Time: 05/10/20 11:58 PM   Specimen: Nasopharyngeal Swab  Result Value Ref Range Status   SARS Coronavirus 2 NEGATIVE NEGATIVE Final    Comment:  (NOTE) SARS-CoV-2 target nucleic acids are NOT DETECTED.  The SARS-CoV-2 RNA is generally detectable in upper and lower respiratory specimens during the acute phase of infection. The lowest concentration of SARS-CoV-2 viral copies this assay can detect is 250 copies / mL. A negative result does not preclude SARS-CoV-2 infection and should not be used as the sole basis for treatment or other patient management decisions.  A negative result may occur with improper specimen collection / handling, submission of specimen other than nasopharyngeal swab, presence of viral mutation(s) within the areas targeted by this assay, and inadequate number of viral copies (<250 copies / mL). A negative result must be combined with clinical observations, patient history, and epidemiological information.  Fact Sheet for Patients:   StrictlyIdeas.no  Fact Sheet for Healthcare Providers: BankingDealers.co.za  This test is not yet approved or  cleared by the [place] FDA and has been authorized for detection and/or diagnosis of SARS-CoV-2 by FDA under an Emergency Use Authorization (EUA).  This EUA will remain in effect (meaning this test can be used) for the duration of the COVID-19 declaration under Section 564(b)(1) of the Act, 21 U.S.C. section 360bbb-3(b)(1), unless the authorization is terminated or revoked sooner.  Performed at [facility], [address], [place], [place] [address]   Urine culture     Status: None   Collection Time: 05/11/20  9:10 AM   Specimen: Urine, Clean Catch  Result Value Ref Range Status   Specimen Description   Final    URINE, CLEAN CATCH Performed at [facility], [address] [place], [place] [address]    Special Requests   Final    NONE Performed at [facility], [address] [place], [place] [address]    Culture   Final    NO GROWTH Performed at [facility], [place] [address], [place], [place]  [address]    Report Status 05/12/2020 FINAL  Final  Culture, body fluid-bottle     Status: None (Preliminary result)   Collection Time: 05/14/20  3:15 PM   Specimen: Pleura  Result Value Ref Range Status   Specimen Description PLEURAL  Final   Special Requests NONE  Final   Culture   Final    NO GROWTH < 24 HOURS Performed at [facility], [address], [place], [place] [address]    Report Status PENDING  Incomplete  Gram stain     Status: None   Collection Time: 05/14/20  3:15 PM   Specimen: Pleura  Result Value Ref  Range Status   Specimen Description PLEURAL  Final   Special Requests NONE  Final   Gram Stain   Final    WBC SEEN WBC PRESENT,BOTH PMN AND MONONUCLEAR NO ORGANISMS SEEN CYTOSPIN SMEAR PERFORMED AT [facility] Performed at [facility], [address] [place], [place] [address]    Report Status 05/14/2020 FINAL  Final     Scheduled Meds: . amantadine  100 mg Oral BID  . aspirin  325 mg Oral Daily  . atorvastatin  40 mg Oral Daily  . Chlorhexidine Gluconate Cloth  6 each Topical Daily  . dextromethorphan-guaiFENesin  1 tablet Oral BID  . diltiazem  120 mg Oral QPC breakfast  . ipratropium-albuterol  3 mL Nebulization Q6H  . pantoprazole  40 mg Oral BID AC  . tamsulosin  0.4 mg Oral Daily   Continuous Infusions: . sodium chloride 10 mL/hr at 05/15/20 0548  . cefTRIAXone (ROCEPHIN)  IV Stopped (05/14/20 2202)    Procedures/Studies: DG Chest 1 View  Result Date: 05/14/2020 CLINICAL DATA:  Complicated RIGHT pleural effusion post diagnostic thoracentesis EXAM: CHEST  1 VIEW COMPARISON:  05/10/2020 FINDINGS: Enlargement of cardiac silhouette. Mediastinal contours and pulmonary vascularity normal. Persistent RIGHT pleural effusion and significant atelectasis and/or consolidation of the mid to lower RIGHT lung. New lucency at the RIGHT apex, suspect representing apical pneumothorax. No mediastinal shift. Patient asymptomatic. Persistent infiltrate LEFT lower lobe and tiny  LEFT pleural effusion. IMPRESSION: Probable RIGHT apical pneumothorax post diagnostic thoracentesis. Persistent complicated partially loculated RIGHT pleural effusion, RIGHT basilar atelectasis versus consolidation, and LEFT lower lobe consolidation. Patient asymptomatic. Findings discussed with Dr. Elsworth Soho on 05/14/2020 at 1545 hours. Electronically Signed   By: Lavonia Dana M.D.   On: 05/14/2020 15:51   CT Angio Chest PE W and/or Wo Contrast  Result Date: 05/11/2020 CLINICAL DATA:  Shortness of breath and weakness for several days EXAM: CT ANGIOGRAPHY CHEST WITH CONTRAST TECHNIQUE: Multidetector CT imaging of the chest was performed using the standard protocol during bolus administration of intravenous contrast. Multiplanar CT image reconstructions and MIPs were obtained to evaluate the vascular anatomy. CONTRAST:  3mL OMNIPAQUE IOHEXOL 350 MG/ML SOLN COMPARISON:  Chest x-ray from earlier in the same day, CTA from 07/12/2018. FINDINGS: Cardiovascular: Thoracic aorta demonstrates atherosclerotic calcifications. No aneurysmal dilatation or dissection is noted. No cardiac enlargement is seen. Coronary calcifications are noted. The pulmonary artery shows a normal branching pattern. No definitive filling defect is identified to suggest pulmonary embolism. Mediastinum/Nodes: Thoracic inlet shows a hypodensity within the left lobe of the thyroid measuring 17 mm in greatest dimension. This is relatively stable from a prior CT examination from 2019. No sizable hilar or mediastinal adenopathy is noted. The esophagus demonstrates some diffuse wall thickening which may be related to reflux esophagitis. Lungs/Pleura: Bilateral moderate pleural effusions are seen. Associated lower lobe consolidation is noted. No sizable parenchymal nodules are seen. No pneumothorax is noted. Upper Abdomen: Visualized upper abdomen shows geographic decreased attenuation within the left kidney and enlargement of the left kidney. This may simply  be related to hydronephrosis. The possibility of an infiltrating mass deserves consideration. This can be further evaluated with CT of the abdomen and pelvis with contrast. Musculoskeletal: No acute bony abnormality is noted. Review of the MIP images confirms the above findings. IMPRESSION: No evidence of pulmonary emboli. Bilateral pleural effusions and lower lobe consolidation similar to that seen on recent chest x-ray. This is consistent with bibasilar pneumonia and reactive effusions. Diffuse wall thickening throughout the esophagus likely related  to reflux esophagitis. Clinical correlation is recommended. It should be noted this is stable from a prior exam of 2019. Enlargement of the left kidney with decreased attenuation identified. Although this may simply represent hydronephrosis possibility of an underlying infiltrative mass deserves consideration. CT of the abdomen and pelvis with contrast is recommended. 17 mm left thyroid nodule. This is roughly stable from the prior CT examination from 2019 and likely benign. Recommend thyroid US (ref: J Am Coll Radiol. 2015 Feb;12(2): 143-50). Electronically Signed   By: Inez Catalina M.D.   On: 05/11/2020 00:00   CT ABDOMEN PELVIS W CONTRAST  Result Date: 05/13/2020 CLINICAL DATA:  LEFT renal mass. Suspicion for renal mass on prior chest CT. EXAM: CT ABDOMEN AND PELVIS WITH CONTRAST TECHNIQUE: Multidetector CT imaging of the abdomen and pelvis was performed using the standard protocol following bolus administration of intravenous contrast. CONTRAST:  146mL OMNIPAQUE IOHEXOL 300 MG/ML  SOLN COMPARISON:  Chest CT of May 10, 2020 FINDINGS: Lower chest: Loculated bilateral basilar effusions with similar appearance when compared to the study of May 10, 2020. Basilar airspace disease with increasing airspace disease particularly at the LEFT lung base. Hiatal hernia and patulous distal esophagus. Calcified coronary artery disease partially imaged. No pericardial  effusion. Hepatobiliary: Liver without focal, suspicious lesion. Portal vein is patent. No pericholecystic stranding. No biliary duct dilation. Pancreas: Pancreas is normal without peripancreatic stranding, ductal dilation or inflammation. Spleen: Spleen normal in size and contour. Adrenals/Urinary Tract: Adrenal glands are normal. Moderate to marked LEFT-sided hydroureteronephrosis with overlying cortical thinning. Large calculus in the proximal LEFT ureter measuring 9 x 8 x 8 mm. No perinephric abscess. Urinary bladder is normal. Normal enhancement of the RIGHT kidney. Enhancement of the kidneys despite scarring and dilation is relatively symmetric and there is an additional small calculus to moderate sized calculus in the lower pole the LEFT kidney. Area with limited assessment due to motion artifact. Stomach/Bowel: Hiatal hernia and some distal esophageal thickening as described. Small duodenal diverticulum in the second portion of the duodenum. No acute gastrointestinal process. Appendix not visualized. Motion artifact in this area limiting assessment but no gross findings of inflammation that would indicate pericecal stranding or secondary signs of acute appendicitis. Colonic interposition along the anterior portion of the liver. No pericolonic stranding. No perirectal stranding. Vascular/Lymphatic: Calcific and noncalcific atheromatous plaque of the abdominal aorta. No aneurysmal dilation. No adenopathy in the retroperitoneum or upper abdomen. No pelvic lymphadenopathy. Reproductive: Heterogeneous prostate. Calcifications of the vas deferens. Other: No ascites. Musculoskeletal: No acute or destructive bone finding. Spinal degenerative changes. IMPRESSION: 1. Moderate to marked hydronephrosis due to calculus in the proximal to mid LEFT ureter. Urologic consultation and correlation with urine studies to exclude concomitant infection is suggested. 2. Cortical thinning that is seen on the LEFT and preservation of  renal enhancement suggests that an element of this process could potentially be chronic. 3. Loculated effusions and worsening of LEFT lower lobe airspace disease that may represent a combination of volume loss and pneumonitis 4. Loculated bilateral basilar effusions with similar appearance when compared to the study of May 10, 2020. Basilar airspace disease with increasing airspace disease particularly at the LEFT lung base. Findings may represent worsening pneumonia or aspiration. 5. Hiatal hernia and patulous distal esophagus. 6. Aortic atherosclerosis. Aortic Atherosclerosis (ICD10-I70.0). Electronically Signed   By: Zetta Bills M.D.   On: 05/13/2020 12:52   DG Chest Port 1 View  Result Date: 05/10/2020 CLINICAL DATA:  Questionable sepsis.  Pneumonia diagnosis yesterday.  EXAM: PORTABLE CHEST 1 VIEW COMPARISON:  Radiograph yesterday. FINDINGS: Upper normal heart size. Unchanged mediastinal contours. Hazy symmetric bibasilar opacities are unchanged from radiograph yesterday. No pulmonary edema. No pneumothorax. IMPRESSION: Unchanged symmetric bibasilar opacities are unchanged from yesterday, likely combination of pleural effusions and atelectasis/pneumonia. Electronically Signed   By: Keith Rake M.D.   On: 05/10/2020 21:24   DG Chest Port 1 View  Result Date: 05/09/2020 CLINICAL DATA:  Cough and fever. EXAM: PORTABLE CHEST 1 VIEW COMPARISON:  09/06/2018 FINDINGS: Heart size is normal. There are bilateral pleural effusions and infiltrate/atelectasis in both lower lungs, consistent with pneumonia. Upper lungs are clear. IMPRESSION: Bilateral lower lobe atelectasis and pneumonia.  Small effusions. Electronically Signed   By: Nelson Chimes M.D.   On: 05/09/2020 14:16   DG Chest Port 1V same Day  Result Date: 05/14/2020 CLINICAL DATA:  Follow-up right-sided pneumothorax. EXAM: PORTABLE CHEST 1 VIEW COMPARISON:  May 14, 2020 (3:02 p.m.) FINDINGS: Stable moderate to marked severity atelectasis and/or  infiltrate is noted within the right lung base with stable mild to moderate severity left basilar atelectasis and/or infiltrate seen. There is a small, stable left pleural effusion with a stable moderate sized right pleural effusion. A stable moderate sized right apical pneumothorax is seen. A mild amount of radiopaque contrast is again seen along the infrahilar region on the right. The heart size and mediastinal contours are within normal limits. The visualized skeletal structures are unremarkable. IMPRESSION: No significant interval change when compared to the prior study dated May 14, 2020 (3:02 p.m.) Electronically Signed   By: Virgina Norfolk M.D.   On: 05/14/2020 19:27   DG Swallowing Func-Speech Pathology  Result Date: 05/14/2020 Objective Swallowing Evaluation: Type of Study: MBS-Modified Barium Swallow Study  Patient Details Name: Evan Chavez MRN: [id number] Date of Birth: 11-29-1960 Today's Date: 05/14/2020 Time: SLP Start Time (ACUTE ONLY): 19 -SLP Stop Time (ACUTE ONLY): 1300 SLP Time Calculation (min) (ACUTE ONLY): 30 min Past Medical History: Past Medical History: Diagnosis Date . Cocaine abuse ([place])  . ETOH abuse  . GERD (gastroesophageal reflux disease)  . Hyperlipidemia  . Stroke (cerebrum) ([place]) 06/2018  right sided weakness Past Surgical History: Past Surgical History: Procedure Laterality Date . BIOPSY  08/25/2018  Procedure: BIOPSY;  Surgeon: [name], MD;  [facility];  Service: Endoscopy;; . BIOPSY  12/13/2018  Procedure: BIOPSY;  Surgeon: [name], MD;  [facility];  Service: Endoscopy;;  esophagus . COLONOSCOPY WITH PROPOFOL N/A 04/07/2019  Dr. [name]: 3 Tubular adenomas removed (4 to 8 mm in size), next colonoscopy 3 years. . ESOPHAGOGASTRODUODENOSCOPY (EGD) WITH PROPOFOL N/A 08/25/2018  Dr. [name]: Severely inflamed abnormal appearing mid/distal esophagus.  Encroachment on lumen suspicious for infiltrating neoplasm located be all from severe  benign inflammation, extensive gastric erosions and extensive duodenal ulcerations.  Query ischemic process versus other.  Biopsy revealed Barrett's esophagus but negative for malignancy and H. pylori. . ESOPHAGOGASTRODUODENOSCOPY (EGD) WITH PROPOFOL N/A 12/13/2018  Dr. [name]: peptic stricture s/p balloon dilatation. abnormal esophageal mucosa, by c/w barrett's without dysplasia, previous PUD completely healed.  Marland Kitchen POLYPECTOMY  04/07/2019  Procedure: POLYPECTOMY;  Surgeon: [name], MD;  [facility];  Service: Endoscopy;;  colon HPI: 59 y.o. male with medical history significant for prior stroke, hyperlipidemia, GERD, BPH, barrett's esophagus    and remote history of cocaine abuse who presents to the emergency department due to 5 days of weakness, complains of productive cough with clear phlegm  with occasional black speckles with associated reproducible midsternal chest pain. Patient was seen in the ED 2 days ago (7/28) due to similar symptoms, during which he reported a max temperature of 100F.  Chest x-ray done at that time showed pneumonia and patient was treated with IV fluid.  Amoxicillin and doxycycline were prescribed on the was advised to follow-up with his primary care physician.  Patient returned to the ED yesterday due to increased heart rate and sob.  He states that he was taking his blood pressure medication as prescribed and denies cocaine use. Bilateral pleural effusions and lower lobe consolidation similar to that seen on recent chest x-ray. This is consistent with bibasilar pneumonia and reactive effusions. Diffuse wall thickening throughout the esophagus likely related to reflux esophagitis. Clinical correlation is recommended. It should be noted this is stable from a prior exam of 2019.  Subjective: "My chest hurts when I am coughing sometimes." Assessment / Plan / Recommendation CHL IP CLINICAL IMPRESSIONS 05/14/2020 Clinical Impression Pt presents with mild oropharyngeal phase  dysphagia characterized by lingual pumping with reduced bolus cohesiveness and premature spillage of liquids to the valleculae. Hyolaryngeal excursion appeared WNL. Pt without penetration/aspiration across consistencies/textures and only with trace amounts of thin liquid residuals along base of tongue and in lateral channels which cleared with secondary swallow. Pt is currently on 4L O2 via nasal cannula. Risk for aspiration with po intake appears minimal, however his respiratory status slightly increases his risk. Pt also with history of esophageal dysphagia. Recommend regular textures and thin liquids with standard aspiration and reflux precautions. No further SLP services indicated at this time. This study was reviewed with Dr. Elsworth Soho (pulmonary).  SLP Visit Diagnosis Dysphagia, oropharyngeal phase (R13.12) Attention and concentration deficit following -- Frontal lobe and executive function deficit following -- Impact on safety and function Mild aspiration risk   CHL IP TREATMENT RECOMMENDATION 05/14/2020 Treatment Recommendations No treatment recommended at this time   Prognosis 05/14/2020 Prognosis for Safe Diet Advancement Good Barriers to Reach Goals -- Barriers/Prognosis Comment -- CHL IP DIET RECOMMENDATION 05/14/2020 SLP Diet Recommendations Regular solids;Thin liquid Liquid Administration via Cup;Straw Medication Administration Whole meds with liquid Compensations Slow rate;Small sips/bites Postural Changes Seated upright at 90 degrees;Remain semi-upright after after feeds/meals (Comment)   CHL IP OTHER RECOMMENDATIONS 05/14/2020 Recommended Consults -- Oral Care Recommendations Oral care BID Other Recommendations Clarify dietary restrictions   CHL IP FOLLOW UP RECOMMENDATIONS 05/14/2020 Follow up Recommendations None   CHL IP FREQUENCY AND DURATION 05/14/2020 Speech Therapy Frequency (ACUTE ONLY) min 2x/week Treatment Duration 1 week      CHL IP ORAL PHASE 05/14/2020 Oral Phase Impaired Oral - Pudding Teaspoon -- Oral -  Pudding Cup -- Oral - Honey Teaspoon -- Oral - Honey Cup -- Oral - Nectar Teaspoon -- Oral - Nectar Cup -- Oral - Nectar Straw -- Oral - Thin Teaspoon Premature spillage Oral - Thin Cup Premature spillage;Decreased bolus cohesion Oral - Thin Straw Premature spillage Oral - Puree Lingual pumping Oral - Mech Soft -- Oral - Regular Delayed oral transit Oral - Multi-Consistency -- Oral - Pill -- Oral Phase - Comment --  CHL IP PHARYNGEAL PHASE 05/14/2020 Pharyngeal Phase Impaired Pharyngeal- Pudding Teaspoon -- Pharyngeal -- Pharyngeal- Pudding Cup -- Pharyngeal -- Pharyngeal- Honey Teaspoon -- Pharyngeal -- Pharyngeal- Honey Cup -- Pharyngeal -- Pharyngeal- Nectar Teaspoon -- Pharyngeal -- Pharyngeal- Nectar Cup -- Pharyngeal -- Pharyngeal- Nectar Straw -- Pharyngeal -- Pharyngeal- Thin Teaspoon Delayed swallow initiation-vallecula Pharyngeal -- Pharyngeal- Thin Cup Delayed swallow  initiation-vallecula;Lateral channel residue Pharyngeal -- Pharyngeal- Thin Straw Lateral channel residue;Delayed swallow initiation-vallecula Pharyngeal -- Pharyngeal- Puree Delayed swallow initiation-vallecula;WFL Pharyngeal -- Pharyngeal- Mechanical Soft -- Pharyngeal -- Pharyngeal- Regular WFL Pharyngeal -- Pharyngeal- Multi-consistency -- Pharyngeal -- Pharyngeal- Pill WFL Pharyngeal -- Pharyngeal Comment --  CHL IP CERVICAL ESOPHAGEAL PHASE 05/14/2020 Cervical Esophageal Phase WFL Pudding Teaspoon -- Pudding Cup -- Honey Teaspoon -- Honey Cup -- Nectar Teaspoon -- Nectar Cup -- Nectar Straw -- Thin Teaspoon -- Thin Cup -- Thin Straw -- Puree -- Mechanical Soft -- Regular -- Multi-consistency -- Pill -- Cervical Esophageal Comment -- Thank you, Genene Churn, [place] PORTER,DABNEY 05/14/2020, 2:21 PM              ECHOCARDIOGRAM COMPLETE  Result Date: 05/11/2020    ECHOCARDIOGRAM REPORT   Patient Name:   KEELON ZURN Pederson Date of Exam: 05/11/2020 Medical Rec #:  [id number]           Height:       69.0 in Accession #:     [id number]          Weight:       153.0 lb Date of Birth:  1961/05/16           BSA:          1.844 m Patient Age:    26 years            BP:           141/90 mmHg Patient Gender: M                   HR:           100 bpm. Exam Location:  [facility] Procedure: 2D Echo, Cardiac Doppler and Color Doppler Indications:    CHF  History:        Patient has prior history of Echocardiogram examinations, most                 recent 07/13/2018. CHF, Stroke, Signs/Symptoms:Shortness of                 Breath; Risk Factors:Hypertension and Dyslipidemia. Cocaine                 abuse, Pneumonia.  Sonographer:    Dustin Flock RDCS Referring Phys: [id number] [place]  1. Left ventricular ejection fraction, by estimation, is 65 to 70%. The left ventricle has normal function. The left ventricle has no regional wall motion abnormalities. Left ventricular diastolic parameters are consistent with Grade I diastolic dysfunction (impaired relaxation).  2. Right ventricular systolic function is normal. The right ventricular size is normal. Tricuspid regurgitation signal is inadequate for assessing PA pressure.  3. The mitral valve is grossly normal. Trivial mitral valve regurgitation.  4. The aortic valve is tricuspid. Aortic valve regurgitation is not visualized.  5. The inferior vena cava is normal in size with greater than 50% respiratory variability, suggesting right atrial pressure of 3 mmHg. FINDINGS  Left Ventricle: Left ventricular ejection fraction, by estimation, is 65 to 70%. The left ventricle has normal function. The left ventricle has no regional wall motion abnormalities. The left ventricular internal cavity size was normal in size. There is  no left ventricular hypertrophy. Left ventricular diastolic parameters are consistent with Grade I diastolic dysfunction (impaired relaxation). Right Ventricle: The right ventricular size is normal. No increase in right ventricular wall thickness. Right ventricular  systolic function is normal. Tricuspid regurgitation signal is inadequate for assessing  PA pressure. Left Atrium: Left atrial size was normal in size. Right Atrium: Right atrial size was normal in size. Pericardium: There is no evidence of pericardial effusion. Mitral Valve: The mitral valve is grossly normal. Trivial mitral valve regurgitation. Tricuspid Valve: The tricuspid valve is grossly normal. Tricuspid valve regurgitation is mild. Aortic Valve: The aortic valve is tricuspid. Aortic valve regurgitation is not visualized. Pulmonic Valve: The pulmonic valve was grossly normal. Pulmonic valve regurgitation is trivial. Aorta: The aortic root is normal in size and structure. Venous: The inferior vena cava is normal in size with greater than 50% respiratory variability, suggesting right atrial pressure of 3 mmHg. IAS/Shunts: No atrial level shunt detected by color flow Doppler.  LEFT VENTRICLE PLAX 2D LVIDd:         4.30 cm  Diastology LVIDs:         2.68 cm  LV e' lateral:   6.42 cm/s LV PW:         0.82 cm  LV E/e' lateral: 11.1 LV IVS:        0.98 cm  LV e' medial:    5.11 cm/s LVOT diam:     2.20 cm  LV E/e' medial:  13.9 LV SV:         81 LV SV Index:   44 LVOT Area:     3.80 cm  RIGHT VENTRICLE RV Basal diam:  2.99 cm RV S prime:     21.60 cm/s TAPSE (M-mode): 3.1 cm LEFT ATRIUM             Index       RIGHT ATRIUM           Index LA diam:        3.50 cm 1.90 cm/m  RA Area:     14.20 cm LA Vol (A2C):   42.8 ml 23.21 ml/m RA Volume:   31.20 ml  16.92 ml/m LA Vol (A4C):   46.7 ml 25.33 ml/m LA Biplane Vol: 45.6 ml 24.73 ml/m  AORTIC VALVE LVOT Vmax:   124.00 cm/s LVOT Vmean:  72.900 cm/s LVOT VTI:    0.212 m  AORTA Ao Root diam: 3.00 cm MITRAL VALVE MV Area (PHT): 6.65 cm     SHUNTS MV Decel Time: 114 msec     Systemic VTI:  0.21 m MV E velocity: 71.10 cm/s   Systemic Diam: 2.20 cm MV A velocity: 113.00 cm/s MV E/A ratio:  0.63 Rozann Lesches MD Electronically signed by Rozann Lesches MD Signature  Date/Time: 05/11/2020/4:35:39 PM    Final    US THORACENTESIS ASP PLEURAL SPACE W/IMG GUIDE  Result Date: 05/14/2020 INDICATION: Complicated RIGHT pleural effusion EXAM: ULTRASOUND GUIDED DIAGNOSTIC RIGHT THORACENTESIS MEDICATIONS: None. COMPLICATIONS: Probable RIGHT pneumothorax, see chest radiograph report PROCEDURE: An ultrasound guided thoracentesis was thoroughly discussed with the patient and questions answered. The benefits, risks, alternatives and complications were also discussed. The patient understands and wishes to proceed with the procedure. Written consent was obtained. Ultrasound was performed to localize and mark an adequate pocket of fluid in the posterior RIGHT chest. The area was then prepped and draped in the normal sterile fashion. 1% Lidocaine was used for local anesthesia. Under ultrasound guidance a 5 [place] Yueh catheter was introduced. Thoracentesis was performed. The catheter was removed and a dressing applied. FINDINGS: A total of approximately 30 mL of cloudy yellow slightly thicker RIGHT pleural fluid was removed. Samples were sent to the laboratory as requested by the clinical team. IMPRESSION: Successful ultrasound  guided diagnostic RIGHT thoracentesis yielding 30 mL of cloudy yellow RIGHT pleural fluid, cannot exclude infection/empyema with this appearance. Findings discussed with Dr. Elsworth Soho on 05/14/2020 at 1545 hours. Electronically Signed   By: Lavonia Dana M.D.   On: 05/14/2020 15:50    Orson Eva, DO  [facility]  If 7PM-7AM, please contact night-coverage [email or web address] Password [email or web address] 05/15/2020, 8:56 AM   LOS: 4 days

## 2020-05-15 NOTE — Pre-Procedure Instructions (Signed)
Note sent to Gibson Ramp at [facility] concerning current medical status and letting Dr Alyson Ingles be aware of this and to see if he wants to proceed with ESL next week.

## 2020-05-16 ENCOUNTER — Inpatient Hospital Stay (HOSPITAL_COMMUNITY): Payer: Medicaid Other

## 2020-05-16 ENCOUNTER — Encounter (HOSPITAL_COMMUNITY)
Admit: 2020-05-16 | Discharge: 2020-05-16 | Disposition: A | Discharge status: Home / Self Care | Payer: Medicaid Other | Attending: Urology | Admitting: Urology

## 2020-05-16 DIAGNOSIS — J189 Pneumonia, unspecified organism: Secondary | ICD-10-CM

## 2020-05-16 DIAGNOSIS — J918 Pleural effusion in other conditions classified elsewhere: Secondary | ICD-10-CM

## 2020-05-16 DIAGNOSIS — J90 Pleural effusion, not elsewhere classified: Secondary | ICD-10-CM | POA: Diagnosis present

## 2020-05-16 LAB — CBC
HCT: 27.6 % — ABNORMAL LOW (ref 39.0–52.0)
Hemoglobin: 8.5 g/dL — ABNORMAL LOW (ref 13.0–17.0)
MCH: 26.9 pg (ref 26.0–34.0)
MCHC: 30.8 g/dL (ref 30.0–36.0)
MCV: 87.3 fL (ref 80.0–100.0)
Platelets: 428 10*3/uL — ABNORMAL HIGH (ref 150–400)
RBC: 3.16 MIL/uL — ABNORMAL LOW (ref 4.22–5.81)
RDW: 13.2 % (ref 11.5–15.5)
WBC: 15.7 10*3/uL — ABNORMAL HIGH (ref 4.0–10.5)
nRBC: 0.1 % (ref 0.0–0.2)

## 2020-05-16 LAB — BASIC METABOLIC PANEL
Anion gap: 13 (ref 5–15)
BUN: 11 mg/dL (ref 6–20)
CO2: 25 mmol/L (ref 22–32)
Calcium: 8.6 mg/dL — ABNORMAL LOW (ref 8.9–10.3)
Chloride: 99 mmol/L (ref 98–111)
Creatinine, Ser: 0.91 mg/dL (ref 0.61–1.24)
GFR calc Af Amer: >60 mL/min (ref >60)
GFR calc non Af Amer: >60 mL/min (ref >60)
Glucose, Bld: 121 mg/dL — ABNORMAL HIGH (ref 70–99)
Potassium: 3.7 mmol/L (ref 3.5–5.1)
Sodium: 137 mmol/L (ref 135–145)

## 2020-05-16 LAB — CHOLESTEROL, BODY FLUID: Cholesterol, Fluid: 63 mg/dL

## 2020-05-16 LAB — GLUCOSE, CAPILLARY
Glucose-Capillary: 117 mg/dL — ABNORMAL HIGH (ref 70–99)
Glucose-Capillary: 102 mg/dL — ABNORMAL HIGH (ref 70–99)

## 2020-05-16 MED ORDER — FENTANYL CITRATE (PF) 100 MCG/2ML IJ SOLN
25.0000 ug | Freq: Once | INTRAMUSCULAR | Status: AC
  Administered 2020-05-16: 25 ug via INTRAVENOUS
  Filled 2020-05-16: qty 2

## 2020-05-16 MED ORDER — METOPROLOL TARTRATE 5 MG/5ML IV SOLN
5.0000 mg | Freq: Four times a day (QID) | INTRAVENOUS | Status: DC
  Administered 2020-05-16 – 2020-05-22 (×22): 5 mg via INTRAVENOUS
  Filled 2020-05-16 (×22): qty 5

## 2020-05-16 MED ORDER — VANCOMYCIN HCL 750 MG/150ML IV SOLN
750.0000 mg | Freq: Three times a day (TID) | INTRAVENOUS | Status: DC
  Filled 2020-05-16: qty 150

## 2020-05-16 MED ORDER — FENTANYL CITRATE (PF) 100 MCG/2ML IJ SOLN
25.0000 ug | INTRAMUSCULAR | Status: AC | PRN
  Administered 2020-05-16 – 2020-05-17 (×2): 25 ug via INTRAVENOUS
  Filled 2020-05-16 (×2): qty 2

## 2020-05-16 MED ORDER — VANCOMYCIN HCL 1250 MG/250ML IV SOLN
1250.0000 mg | INTRAVENOUS | Status: AC
  Administered 2020-05-16: 1250 mg via INTRAVENOUS
  Filled 2020-05-16: qty 250

## 2020-05-16 MED ORDER — VANCOMYCIN HCL 750 MG/150ML IV SOLN
750.0000 mg | Freq: Three times a day (TID) | INTRAVENOUS | Status: DC
  Administered 2020-05-17: 750 mg via INTRAVENOUS
  Filled 2020-05-16 (×2): qty 150

## 2020-05-16 MED ORDER — CHLORHEXIDINE GLUCONATE 0.12 % MT SOLN
15.0000 mL | Freq: Two times a day (BID) | OROMUCOSAL | Status: DC
  Administered 2020-05-16 – 2020-05-24 (×16): 15 mL via OROMUCOSAL
  Filled 2020-05-16 (×13): qty 15

## 2020-05-16 MED ORDER — ORAL CARE MOUTH RINSE
15.0000 mL | Freq: Two times a day (BID) | OROMUCOSAL | Status: DC
  Administered 2020-05-17 – 2020-07-11 (×72): 15 mL via OROMUCOSAL

## 2020-05-16 MED ORDER — SODIUM CHLORIDE 0.9 % IV SOLN: INTRAVENOUS | Status: DC

## 2020-05-16 MED ORDER — VANCOMYCIN HCL 1250 MG/250ML IV SOLN: 1250.0000 mg | Freq: Once | INTRAVENOUS | Status: DC

## 2020-05-16 NOTE — Progress Notes (Addendum)
Name: Evan Chavez MRN: [id number] DOB: 21-Aug-1961    ADMISSION DATE:  05/10/2020 CONSULTATION DATE:  05/16/2020   REFERRING MD :  Tat, triad MD  CHIEF COMPLAINT:  Pneumonia, effusions  BRIEF PATIENT DESCRIPTION: 59 year old man with Barrett's esophagus and esophageal stricture admitted with bibasilar pneumonia and loculated effusions.  Similar admission for bibasal pna 08/2018 Developed abdominal pain and found left hydronephrosis with ureteral obstruction   He was hospitalized November 2019 with bibasal pneumonia and imaging showed esophageal stricture and impaired esophageal motility. EGD confirmed severe reflux esophagitis with peptic ulcer disease with extensive gastric erosions and duodenal ulcer.  Biopsy revealed Barrett's esophagus. He had a repeat EGD 12/2018 with balloon dilatation for peptic stricture, his dysphagia improved and surveillance EGD was advised  SIGNIFICANT EVENTS  8/2 US guided LT thora >> apical pneumothorax  STUDIES:   EGD 12/2018 esophageal peptic  Stricture , dilated , Barrett'sesophagus   CT angio 08/2018 >> bibasal pneumonia Esophagram 08/2018 >> esophagitis. Focal area of relative narrowing of the esophagus is identified estimated over 5 cm length  Diffusely impaired esophageal motility.  Laryngeal penetration without gross aspiration  Ct angio chest 7/29 >>Bilateral pleural effusions and lower lobe consolidation  Diffuse wall thickening throughout the esophagus -stable from a prior exam of 2019. Lt hnosis  CT abd/pelvis W con 8/1 >> worsening of LEFT lower lobe airspace disease that may represent a combination of volume loss and pneumonitis Loculated bilateral basilar effusions with similar appearance       PAST MEDICAL HISTORY :   has a past medical history of Cocaine abuse ([place]), ETOH abuse, GERD (gastroesophageal reflux disease), Hyperlipidemia, and Stroke (cerebrum) ([place]) (06/2018).  has a past surgical history that includes  Esophagogastroduodenoscopy (egd) with propofol (N/A, 08/25/2018); biopsy (08/25/2018); Esophagogastroduodenoscopy (egd) with propofol (N/A, 12/13/2018); biopsy (12/13/2018); Colonoscopy with propofol (N/A, 04/07/2019); and polypectomy (04/07/2019).     SUBJECTIVE:   Afebrile Tachypneic Tachycardic Saturation 95% on 8 L nasal cannula    VITAL SIGNS: Temp:  [97.4 F (36.3 C)-100.3 F (37.9 C)] 97.4 F (36.3 C) (08/04 0740) Pulse Rate:  [106-119] 106 (08/04 0700) Resp:  [27-34] 30 (08/04 0700) BP: (138-164)/(77-92) 143/81 (08/04 0700) SpO2:  [88 %-96 %] 95 % (08/04 0842) Weight:  [65.8 kg] 65.8 kg (08/04 0500)  PHYSICAL EXAMINATION: General: Middle-aged man, mild distress Neuro: Left facial weakness, good strength in all extremities HEENT: No pallor, icterus, no JVD Cardiovascular: S1-S2 regular Lungs: Decreased breath sounds bilateral, no crackles or rhonchi , mild accessory muscle use Abdomen: Soft, nontender Musculoskeletal: No deformity Skin: No rash  Chest x-ray 8/4 personally reviewed shows increased right effusion,?  Right hydropneumothorax  Recent Labs  Lab 05/14/20 0604 05/15/20 0331 05/16/20 0519  NA 137 136 137  K 3.7 3.8 3.7  CL 102 101 99  CO2 25 24 25   BUN 9 10 11   CREATININE 0.84 1.02 0.91  GLUCOSE 128* 114* 121*   Recent Labs  Lab 05/14/20 0604 05/15/20 0331 05/16/20 0519  HGB 9.1* 9.2* 8.5*  HCT 29.5* 29.7* 27.6*  WBC 12.7* 16.0* 15.7*  PLT 287 354 428*   DG Chest 1 View  Result Date: 05/14/2020 CLINICAL DATA:  Complicated RIGHT pleural effusion post diagnostic thoracentesis EXAM: CHEST  1 VIEW COMPARISON:  05/10/2020 FINDINGS: Enlargement of cardiac silhouette. Mediastinal contours and pulmonary vascularity normal. Persistent RIGHT pleural effusion and significant atelectasis and/or consolidation of the mid to lower RIGHT lung. New lucency at the RIGHT apex, suspect representing apical pneumothorax. No mediastinal shift.  Patient asymptomatic.  Persistent infiltrate LEFT lower lobe and tiny LEFT pleural effusion. IMPRESSION: Probable RIGHT apical pneumothorax post diagnostic thoracentesis. Persistent complicated partially loculated RIGHT pleural effusion, RIGHT basilar atelectasis versus consolidation, and LEFT lower lobe consolidation. Patient asymptomatic. Findings discussed with Dr. Elsworth Soho on 05/14/2020 at 1545 hours. Electronically Signed   By: Lavonia Dana M.D.   On: 05/14/2020 15:51   DG CHEST PORT 1 VIEW  Result Date: 05/16/2020 CLINICAL DATA:  Pneumothorax post recent thoracentesis, shortness of breath EXAM: PORTABLE CHEST 1 VIEW COMPARISON:  Portable exam 0849 hours compared to 05/14/2020 FINDINGS: Stable heart size and mediastinal contours. RIGHT hydropneumothorax, with increased collapse of RIGHT lung since previous exam. Minimal LEFT pleural effusion with atelectasis versus infiltrate LEFT lower lobe. LEFT upper lung clear. Bones demineralized. IMPRESSION: RIGHT hydropneumothorax with increased collapse of RIGHT lung since previous exam. Minimal LEFT pleural effusion with atelectasis versus infiltrate at LEFT lower lobe. Findings called to Dr. Elsworth Soho on 05/16/2020 at 0922 hours. Electronically Signed   By: Lavonia Dana M.D.   On: 05/16/2020 09:38   DG Chest Port 1V same Day  Result Date: 05/14/2020 CLINICAL DATA:  Follow-up right-sided pneumothorax. EXAM: PORTABLE CHEST 1 VIEW COMPARISON:  May 14, 2020 (3:02 p.m.) FINDINGS: Stable moderate to marked severity atelectasis and/or infiltrate is noted within the right lung base with stable mild to moderate severity left basilar atelectasis and/or infiltrate seen. There is a small, stable left pleural effusion with a stable moderate sized right pleural effusion. A stable moderate sized right apical pneumothorax is seen. A mild amount of radiopaque contrast is again seen along the infrahilar region on the right. The heart size and mediastinal contours are within normal limits. The visualized  skeletal structures are unremarkable. IMPRESSION: No significant interval change when compared to the prior study dated May 14, 2020 (3:02 p.m.) Electronically Signed   By: Virgina Norfolk M.D.   On: 05/14/2020 19:27   US THORACENTESIS ASP PLEURAL SPACE W/IMG GUIDE  Result Date: 05/14/2020 INDICATION: Complicated RIGHT pleural effusion EXAM: ULTRASOUND GUIDED DIAGNOSTIC RIGHT THORACENTESIS MEDICATIONS: None. COMPLICATIONS: Probable RIGHT pneumothorax, see chest radiograph report PROCEDURE: An ultrasound guided thoracentesis was thoroughly discussed with the patient and questions answered. The benefits, risks, alternatives and complications were also discussed. The patient understands and wishes to proceed with the procedure. Written consent was obtained. Ultrasound was performed to localize and mark an adequate pocket of fluid in the posterior RIGHT chest. The area was then prepped and draped in the normal sterile fashion. 1% Lidocaine was used for local anesthesia. Under ultrasound guidance a 5 [place] Yueh catheter was introduced. Thoracentesis was performed. The catheter was removed and a dressing applied. FINDINGS: A total of approximately 30 mL of cloudy yellow slightly thicker RIGHT pleural fluid was removed. Samples were sent to the laboratory as requested by the clinical team. IMPRESSION: Successful ultrasound guided diagnostic RIGHT thoracentesis yielding 30 mL of cloudy yellow RIGHT pleural fluid, cannot exclude infection/empyema with this appearance. Findings discussed with Dr. Elsworth Soho on 05/14/2020 at 1545 hours. Electronically Signed   By: Lavonia Dana M.D.   On: 05/14/2020 15:50    ASSESSMENT / PLAN:  Acute respiratory failure with hypoxia Bibasilar pneumonia --Favor aspiration given his longstanding history with reflux esophagitis and esophageal stricture , although he cleared swallow evaluation RT complex loculated parapneumonic effusion, freely flowing effusion on left ?  Underlying  COPD Right apical pneumothorax post thoracentesis, stable  Strep antigen and Legionella negative  Recommend Continue Zosyn. Discussed with T CTS Dr.  Lightfoot, awaiting transfer to [facility] for right VATS If further delay, we will place chest tube  He also has left hydronephrosis and needs ESWL , lithotripsy has been scheduled on 8/10  Kara Mead MD. FCCP. [facility]  If no response to pager , please call [phone]    05/16/2020, 1:00 PM

## 2020-05-16 NOTE — Progress Notes (Signed)
eLink Physician-Brief Progress Note Patient Name: Evan Chavez DOB: 26-Jun-1961 MRN: [id number]   Date of Service  05/16/2020  HPI/Events of Note  59 year old man transferred from [facility] for esophageal perforation. Has a chest tube placed at outside facility.   eICU Interventions  Seen on camera, bedside team notified and will evaluate      Intervention Category Major Interventions: Hypoxemia - evaluation and management Evaluation Type: New Patient Evaluation  Margaretmary Lombard 05/16/2020, 10:37 PM

## 2020-05-16 NOTE — Procedures (Signed)
Insertion of Chest Tube Procedure Note  Evan Chavez  [id number]  1960/11/21  Date:05/16/20  Time:4:46 PM    Provider Performing: Leanna Sato. Evan Chavez   Procedure: Chest Tube Insertion ([id number])  Indication(s) Effusion  Consent Risks of the procedure as well as the alternatives and risks of each were explained to the patient and/or caregiver.  Consent for the procedure was obtained and is signed in the bedside chart  Anesthesia Topical only with 1% lidocaine    Time Out Verified patient identification, verified procedure, site/side was marked, verified correct patient position, special equipment/implants available, medications/allergies/relevant history reviewed, required imaging and test results available.   Sterile Technique Maximal sterile technique including full sterile barrier drape, hand hygiene, sterile gown, sterile gloves, mask, hair covering, sterile ultrasound probe cover (if used).   Procedure Description Ultrasound used to identify appropriate pleural anatomy for placement and overlying skin marked. With needle, no fluid aspirated due to loculated effusion Area of placement cleaned and draped in sterile fashion.  A 24 French chest tube was placed into the right pleural space using Kelly dissection. Appropriate return of air was obtained.  The tube was connected to atrium and placed on -20 cm H2O wall suction. Good pleural variation with breathing.   Complications/Tolerance None; patient tolerated the procedure well. Chest X-ray is ordered to verify placement >> chest tube in rt costophrenic space, effusion unchanged   EBL Minimal  Specimen(s) none   Evan Chavez V. Elsworth Soho MD [phone]

## 2020-05-16 NOTE — Progress Notes (Signed)
Pharmacy Antibiotic Note  Evan Chavez is a 59 y.o. male admitted on 05/10/2020 with loculated effusion.  Pharmacy was consulted for Zosyn dosing. Pharmacy is now consulted for vancomycin dosing for pneumonia.  WBC 15.7, afebrile, Scr 0.91, CrCl 81.3 ml/min (renal function ~stable)  Plan: Vancomycin 1250 mg IV X 1, followed by vancomycin 750 mg IV Q 8 hrs, per [place] vancomycin nomogram (goal vancomycin trough: 15-20 mg/L) Continue Zosyn 3.375g IV q8h (4 hour infusion).  Monitor WBC, temp, clinical improvement, cultures, renal function, vancomycin levels  Weight: 65.8 kg (145 lb 1 oz)  Temp (24hrs), Avg:98.7 F (37.1 C), Min:97.4 F (36.3 C), Max:99.3 F (37.4 C)  Recent Labs  Lab 05/10/20 2111 05/10/20 2232 05/11/20 0536 05/12/20 1824 05/13/20 0633 05/14/20 0604 05/15/20 0331 05/16/20 0519  WBC 14.6*  --    < > 6.8 7.5 12.7* 16.0* 15.7*  CREATININE 1.03  --    < > 0.91 0.84 0.84 1.02 0.91  LATICACIDVEN 2.4* 1.9  --   --   --   --   --   --    < > = values in this interval not displayed.    Estimated Creatinine Clearance: 81.3 mL/min (by C-G formula based on SCr of 0.91 mg/dL).    No Known Allergies  Antimicrobials this admission: Zosyn 8/3 >>  CTX/Azith 7/30 >> 8/3 Vancomycin 8/4 >>  Dose adjustments this admission: N/A  Microbiology results: 7/29 BCx X 2: NF/final 7/30 UCx: NG/final 8/2 Pleural Cx: Gram stain: WBCs seen, NOS; cx: NGTD 7/29 COVID: negative  Thank you for allowing pharmacy to be a part of this patient's care.  Gillermina Hu, PharmD, BCPS, [facility] Clinical Pharmacist 05/16/2020 7:35 PM

## 2020-05-16 NOTE — Progress Notes (Signed)
Chest x-ray shows chest tube in right costophrenic angle. Good pleural variation at bedside with breathing-gush of air obtained during placement We will obtain CT chest to clarify position. Spoke to CT surgery -no beds available at [facility] to transfer. They will try to see if they can schedule him for surgery that way he can go to pre-op directly  Cayde Held V. Elsworth Soho MD

## 2020-05-16 NOTE — Progress Notes (Signed)
PCCM Interval Note  Briefly Mr. Evan Chavez is a 59 year old male with Barrett's esophagus and esophageal stricture admitted for loculated parapneumonic effusions and bilateral pneumonia. S/p thoracentesis. Discussed with TCTS regarding transfer to [facility] for VATS. Prior to transfer, right chest tube placed. Post-procedure CT demonstrated esophageal perforation. TCTS aware of new findings on imaging.  S: Patient arrived from [facility]. Hemodynamically stable. Chest tube in place with minimal output in chamber.  Blood pressure (!) 148/89, pulse (!) 125, temperature 98.3 F (36.8 C), temperature source Oral, resp. rate (!) 27, height 5\' 9"  (1.753 m), weight 63.6 kg, SpO2 100 %.  On exam, well-appearing male, AAOx4, no gross neuro deficits, follows commands. Cardiac exam with tachycardia and regular rhythm. Lungs with coarse upper breath sounds, diminished bases bilaterally. Right chest tube in place with minimal output.  CT Chest 05/16/20- Mid-distal esophagus defect with contrast noted in pleural space, s/p right chest tube, worsening right hydropneumothorax  Assessment Esophageal perforation Loculated pleural effusion Right hydropneumothorax Acute hypoxemic respiratory failure Aspiration pneumonitis/pneumonia --TCTS consulted with plan for VATS and esophageal stenting. Updated team on patient's arrival. Plan for OR in am. --Remain NPO --Continue Zosyn, bronchodilators --Continue supplemental oxygen for O2 sat goals >92% --Low threshold to intubate if patient develops distress  Left hydronephrosis --scheduled on 8/10 for lithotripsy   The patient is critically ill with multiple organ systems failure and requires high complexity decision making for assessment and support, frequent evaluation and titration of therapies, application of advanced monitoring technologies and extensive interpretation of multiple databases.   Critical Care Time devoted to patient care services described in this note is 35  Minutes.   Rodman Pickle, M.D. [facility] Pulmonary/Critical Care Medicine 05/16/2020 11:07 PM   Please see Amion for pager number to reach on-call Pulmonary and Critical Care Team.

## 2020-05-16 NOTE — Progress Notes (Signed)
PROGRESS NOTE  Evan Chavez DXI:[id number] DOB: 1961-07-09 DOA: 05/10/2020 PCP: Rosita Fire, MD  Brief History:  59 y.o.malewith medical history significant forprior stroke, hyperlipidemia, GERD, BPH, barrett's esophagusand remote history of cocaine abuse who presents to the emergency department due to 5 days of weakness, complains of productive cough with clear phlegm with occasional black speckles with associated reproducible midsternal chest pain. Patientwas seen in the ED 2 days ago (7/28) due to similar symptoms, during which he reported a max temperature of 100F.Chest x-ray done at that time showed pneumonia and patient was treated with IV fluid. Amoxicillinand doxycycline wereprescribed on the was advised to follow-up with his primary care physician. Patient returnedto the ED yesterday due to increased heart rateand sob.He states that he was taking his blood pressure medication as prescribed and denies cocaine use.   ED Course: In the emergency department, he was tachypneic and tachycardic, BP was 157/106. Work-up in the ED showed leukocytosis, normocytic anemia, hyperglycemia, lactic acid 2.4>1.9, BNP146. SARS coronavirus 2 done on 7/28 was negative and repeated test done yesterday was negative as well. Chest x-ray showed unchanged symmetric bibasilar opacities suspected to be due to combination of pleural effusions and atelectasis/pneumonia. CT angiography ofchest with contrast showed no evidence of pulmonary emboli but showed bilateral pleural effusions and lower lobe consolidation similar to that seen on recent chest x-ray that is consistent with bibasilar pneumonia or reactive effusions.  It also showed an incidental finding of enlarged left kidney.  Subsequent CT abdomen/pelvis to clarify the abnormality showed that he had left hydronephrosis with a left proximal ureteral stone.  Urology was consulted.  After discussion with patient's family,  they did not feel the patient needed to have emergent urologic surgery.  The patient is now tentatively set up to have lithotripsy as an outpatient on 05/22/20.  This was verified again after I discussed the case with Dr. Irine Seal on 05/14/20.  Regarding his pneumonia, patient was started on ceftriaxone and azithromycin.  Initially improved.  However after 2 to 3 days, the patient began having increasing shortness of breath.  Pulmonary medicine was consulted.  Thoracocentesis was ordered.  Unfortunately, the patient developed a pneumothorax from thoracocentesis.  CT also showed loculated pleural effusions.  As result, pulmonary medicine recommended transfer to [facility] for VATS procedure.  Assessment/Plan: Sepsis -present on admission -presented with leukocytosis, elevated lactate, tachycardia and radiographic evidence of pneumonia -due to pneumonia -lactate peaked 2.4 -blood cultures remain neg -urine culture neg -UA--no pyuria  Lobar Pneumonia -7/29 CTA chest--no PE. Bilateral LL consolidation with pleural effusions -urine legionella and s.pneumoniae antigens neg -continue duoneb -continue mucinex -finish 5 days azithromycin -continue ceftriaxone   Loculated Parapneumonic effusions/Right Pneumothorax -05/11/20 echo--EF 65-70%, G1DD, trivial MR -now stable on RA, but remains sob -pulmonary consult appreciated-->thoracocentesis -suffered  R-pneumothorax after thora -05/14/20 pleural fluid--5578 WBC, exudative as expected -05/15/20 discussed with pulmonary--Dr. Alva-->transfer to [name] for VATS -Dr. Elsworth Soho already spoke with [facility] and they will see him at [facility].  If transfer is delayed further, Dr. Elsworth Soho will have chest tube placed at [facility].  I spoke with him this morning.   Acute Respiratory Failure with Hypoxia -due to pneumonia and pneumothorax -now on 8L [place]  Left ureteral stone -05/11/20 CT abd--Moderate to marked hydronephrosis due to calculus in the proximal to mid LEFT  ureter.Loculated bilateral basilar effusions with similar appearance when compared to the study of May 10, 2020 -consulted urology--discussed with Dr.  Herrick-->planning outpatient lithotripsy -05/14/20--case discussed with urology, Dr. Cecille Aver scheduled for lithotripsy in [place] on 05/22/20  Barrett's esophagus -reflux esophagitis hx which is contributing to esophageal wall thickening -followed by [facility] -increased protonix to BID -Repeated EGD in March 2020: Peptic stricture status post balloon dilatation. Abnormal esophageal mucosa biopsy, completely healed previously noted peptic ulcer disease, hiatal hernia. Biopsies with Barrett's esophagus without dysplasia or malignancy -per GI recs: Update EGD in 03/2022 for Barrett's esophagus. Will be due colonoscopy at the same time for tubular adenomas  Hyperlipidemia Continue Lipitor  Incidental finding of thyroid noduleby CT neck of your chest with contrast 44mm left thyroid nodule noted and this is probably stable from priorexamination from 2019 and likely benign  Status is: Inpatient  Remains inpatient appropriate because:IV treatments appropriate due to intensity of illness or inability to take PO -pneumothorax, now requires VATS   Dispo: The patient is from:Home Anticipated d/c is HG:[facility] Anticipated d/c date is: >3 days Patient currently is not medically stable to d/c.  Family Communication:mother updated8/3/21  Consultants:urology  Code Status: FULL--confirmed with mother and patient  DVT Prophylaxis: [place] Heparin   Procedures: As Listed in Progress Note Above  Antibiotics: Ceftriaxone>>> azithro 7/29>>>8/2  Subjective: Pt breathing more labored.  Denies chest pain.  Still awaiting bed at [facility] to transfer.   Objective: Vitals:   05/16/20 0600 05/16/20 0700 05/16/20 0740 05/16/20 0842  BP: (!) 152/90 (!) 143/81    Pulse: (!) 112 (!) 106      Resp: (!) 30 (!) 30    Temp:   (!) 97.4 F (36.3 C)   TempSrc:   Oral   SpO2: 94% 93%  95%  Weight:        Intake/Output Summary (Last 24 hours) at 05/16/2020 1309 Last data filed at 05/16/2020 0658 Gross per 24 hour  Intake 266.62 ml  Output 750 ml  Net -483.38 ml   Weight change: -1.7 kg Exam:   General:  Pt is alert, follows commands appropriately, not in acute distress  HEENT: No icterus, No thrush, No neck mass, [place]/AT  Cardiovascular: RRR, S1/S2, no rubs, no gallops  Respiratory:bilateral rhonchi  Abdomen: Soft/+BS, non tender, non distended, no guarding  Extremities: No edema, No lymphangitis, No petechiae, No rashes, no synovitis  Data Reviewed: I have personally reviewed following labs and imaging studies Basic Metabolic Panel: Recent Labs  Lab 05/11/20 0536 05/11/20 0536 05/12/20 1824 05/13/20 2683 05/14/20 0604 05/15/20 0331 05/16/20 0519  NA 135   < > 132* 137 137 136 137  K 3.8   < > 3.3* 3.5 3.7 3.8 3.7  CL 103   < > 101 103 102 101 99  CO2 23   < > 23 23 25 24 25   GLUCOSE 118*   < > 149* 111* 128* 114* 121*  BUN 18   < > 14 10 9 10 11   CREATININE 0.93   < > 0.91 0.84 0.84 1.02 0.91  CALCIUM 8.6*   < > 8.3* 8.6* 8.5* 8.3* 8.6*  MG 1.7  --   --   --   --   --   --   PHOS 2.5  --   --   --   --   --   --    < > = values in this interval not displayed.   Liver Function Tests: Recent Labs  Lab 05/09/20 1546 05/10/20 2111 05/11/20 0536  AST 14* 17 21  ALT 20 22 25   ALKPHOS 60 58  51  BILITOT 0.3 0.2* 0.4  PROT 7.7 7.4 6.5  ALBUMIN 4.1 3.8 3.2*   No results for input(s): LIPASE, AMYLASE in the last 168 hours. No results for input(s): AMMONIA in the last 168 hours. Coagulation Profile: Recent Labs  Lab 05/10/20 2111 05/11/20 0536  INR 1.1 1.1   CBC: Recent Labs  Lab 05/09/20 1546 05/09/20 1546 05/10/20 2111 05/11/20 0536 05/12/20 1824 05/13/20 0633 05/14/20 0604 05/15/20 0331 05/16/20 0519  WBC 17.8*   < > 14.6*   < > 6.8 7.5  12.7* 16.0* 15.7*  NEUTROABS 15.6*  --  12.2*  --   --   --   --   --   --   HGB 12.8*   < > 10.9*   < > 8.0* 8.0* 9.1* 9.2* 8.5*  HCT 41.6   < > 35.8*   < > 25.4* 26.4* 29.5* 29.7* 27.6*  MCV 88.9   < > 90.4   < > 89.8 88.9 88.1 89.5 87.3  PLT 210   < > 199   < > 179 195 287 354 428*   < > = values in this interval not displayed.   Cardiac Enzymes: No results for input(s): CKTOTAL, CKMB, CKMBINDEX, TROPONINI in the last 168 hours. BNP: Invalid input(s): POCBNP CBG: No results for input(s): GLUCAP in the last 168 hours. HbA1C: No results for input(s): HGBA1C in the last 72 hours. Urine analysis:    Component Value Date/Time   COLORURINE YELLOW 05/11/2020 0910   APPEARANCEUR CLEAR 05/11/2020 0910   LABSPEC 1.035 (H) 05/11/2020 0910   PHURINE 7.0 05/11/2020 0910   GLUCOSEU NEGATIVE 05/11/2020 0910   HGBUR NEGATIVE 05/11/2020 0910   BILIRUBINUR NEGATIVE 05/11/2020 0910   BILIRUBINUR NEG 09/22/2018 1527   KETONESUR NEGATIVE 05/11/2020 0910   PROTEINUR 30 (A) 05/11/2020 0910   UROBILINOGEN 0.2 09/22/2018 1527   UROBILINOGEN 0.2 03/03/2012 0205   NITRITE NEGATIVE 05/11/2020 0910   LEUKOCYTESUR NEGATIVE 05/11/2020 0910   Recent Results (from the past 240 hour(s))  SARS Coronavirus 2 by RT PCR (hospital order, performed in [facility] hospital lab) Nasopharyngeal Nasopharyngeal Swab     Status: None   Collection Time: 05/09/20  4:08 PM   Specimen: Nasopharyngeal Swab  Result Value Ref Range Status   SARS Coronavirus 2 NEGATIVE NEGATIVE Final    Comment: (NOTE) SARS-CoV-2 target nucleic acids are NOT DETECTED.  The SARS-CoV-2 RNA is generally detectable in upper and lower respiratory specimens during the acute phase of infection. The lowest concentration of SARS-CoV-2 viral copies this assay can detect is 250 copies / mL. A negative result does not preclude SARS-CoV-2 infection and should not be used as the sole basis for treatment or other patient management decisions.  A  negative result may occur with improper specimen collection / handling, submission of specimen other than nasopharyngeal swab, presence of viral mutation(s) within the areas targeted by this assay, and inadequate number of viral copies (<250 copies / mL). A negative result must be combined with clinical observations, patient history, and epidemiological information.  Fact Sheet for Patients:   StrictlyIdeas.no  Fact Sheet for Healthcare Providers: BankingDealers.co.za  This test is not yet approved or  cleared by the [place] FDA and has been authorized for detection and/or diagnosis of SARS-CoV-2 by FDA under an Emergency Use Authorization (EUA).  This EUA will remain in effect (meaning this test can be used) for the duration of the COVID-19 declaration under Section 564(b)(1) of the Act, 21 U.S.C. section  360bbb-3(b)(1), unless the authorization is terminated or revoked sooner.  Performed at [facility], [address], [place], [place] [address]   Blood Culture (routine x 2)     Status: None   Collection Time: 05/10/20  9:11 PM   Specimen: BLOOD LEFT ARM  Result Value Ref Range Status   Specimen Description BLOOD LEFT ARM  Final   Special Requests   Final    BOTTLES DRAWN AEROBIC AND ANAEROBIC Blood Culture adequate volume   Culture   Final    NO GROWTH 5 DAYS Performed at [facility], [address], [place], [place] [address]    Report Status 05/15/2020 FINAL  Final  Blood Culture (routine x 2)     Status: None   Collection Time: 05/10/20 10:32 PM   Specimen: BLOOD  Result Value Ref Range Status   Specimen Description BLOOD LEFT ANTECUBITAL  Final   Special Requests   Final    BOTTLES DRAWN AEROBIC AND ANAEROBIC Blood Culture adequate volume   Culture   Final    NO GROWTH 5 DAYS Performed at [facility], [address] [place], [place] [address]    Report Status 05/15/2020 FINAL  Final  SARS Coronavirus 2 by RT  PCR (hospital order, performed in [facility] hospital lab) Nasopharyngeal Nasopharyngeal Swab     Status: None   Collection Time: 05/10/20 11:58 PM   Specimen: Nasopharyngeal Swab  Result Value Ref Range Status   SARS Coronavirus 2 NEGATIVE NEGATIVE Final    Comment: (NOTE) SARS-CoV-2 target nucleic acids are NOT DETECTED.  The SARS-CoV-2 RNA is generally detectable in upper and lower respiratory specimens during the acute phase of infection. The lowest concentration of SARS-CoV-2 viral copies this assay can detect is 250 copies / mL. A negative result does not preclude SARS-CoV-2 infection and should not be used as the sole basis for treatment or other patient management decisions.  A negative result may occur with improper specimen collection / handling, submission of specimen other than nasopharyngeal swab, presence of viral mutation(s) within the areas targeted by this assay, and inadequate number of viral copies (<250 copies / mL). A negative result must be combined with clinical observations, patient history, and epidemiological information.  Fact Sheet for Patients:   StrictlyIdeas.no  Fact Sheet for Healthcare Providers: BankingDealers.co.za  This test is not yet approved or  cleared by the [place] FDA and has been authorized for detection and/or diagnosis of SARS-CoV-2 by FDA under an Emergency Use Authorization (EUA).  This EUA will remain in effect (meaning this test can be used) for the duration of the COVID-19 declaration under Section 564(b)(1) of the Act, 21 U.S.C. section 360bbb-3(b)(1), unless the authorization is terminated or revoked sooner.  Performed at [facility], [address], [place], [place] [address]   Urine culture     Status: None   Collection Time: 05/11/20  9:10 AM   Specimen: Urine, Clean Catch  Result Value Ref Range Status   Specimen Description   Final    URINE, CLEAN CATCH Performed at  [facility], [address], [place], [place] [address]    Special Requests   Final    NONE Performed at [facility], [address], [place], [place] [address]    Culture   Final    NO GROWTH Performed at [facility], [place] [address] [place], [place] [address]    Report Status 05/12/2020 FINAL  Final  Culture, body fluid-bottle     Status:  None (Preliminary result)   Collection Time: 05/14/20  3:15 PM   Specimen: Pleura  Result Value Ref Range Status   Specimen Description PLEURAL  Final   Special Requests NONE  Final   Culture   Final    NO GROWTH 2 DAYS Performed at [facility], [address], [place], [place] [address]    Report Status PENDING  Incomplete  Gram stain     Status: None   Collection Time: 05/14/20  3:15 PM   Specimen: Pleura  Result Value Ref Range Status   Specimen Description PLEURAL  Final   Special Requests NONE  Final   Gram Stain   Final    WBC SEEN WBC PRESENT,BOTH PMN AND MONONUCLEAR NO ORGANISMS SEEN CYTOSPIN SMEAR PERFORMED AT [facility] Performed at [facility], [address], [place], [place] [address]    Report Status 05/14/2020 FINAL  Final     Scheduled Meds: . amantadine  100 mg Oral BID  . aspirin  325 mg Oral Daily  . atorvastatin  40 mg Oral Daily  . Chlorhexidine Gluconate Cloth  6 each Topical Daily  . dextromethorphan-guaiFENesin  1 tablet Oral BID  . diltiazem  120 mg Oral QPC breakfast  . heparin injection (subcutaneous)  5,000 Units Subcutaneous Q8H  . ipratropium-albuterol  3 mL Nebulization Q6H  . metoprolol tartrate  2.5 mg Intravenous Q6H  . pantoprazole  40 mg Oral BID AC  . tamsulosin  0.4 mg Oral Daily   Continuous Infusions: . sodium chloride 10 mL/hr at 05/16/20 0658  . piperacillin-tazobactam (ZOSYN)  IV 3.375 g (05/16/20 0801)    Procedures/Studies: DG Chest 1 View  Result Date: 05/14/2020 CLINICAL DATA:  Complicated RIGHT pleural effusion post diagnostic thoracentesis EXAM: CHEST  1 VIEW COMPARISON:   05/10/2020 FINDINGS: Enlargement of cardiac silhouette. Mediastinal contours and pulmonary vascularity normal. Persistent RIGHT pleural effusion and significant atelectasis and/or consolidation of the mid to lower RIGHT lung. New lucency at the RIGHT apex, suspect representing apical pneumothorax. No mediastinal shift. Patient asymptomatic. Persistent infiltrate LEFT lower lobe and tiny LEFT pleural effusion. IMPRESSION: Probable RIGHT apical pneumothorax post diagnostic thoracentesis. Persistent complicated partially loculated RIGHT pleural effusion, RIGHT basilar atelectasis versus consolidation, and LEFT lower lobe consolidation. Patient asymptomatic. Findings discussed with Dr. Elsworth Soho on 05/14/2020 at 1545 hours. Electronically Signed   By: Lavonia Dana M.D.   On: 05/14/2020 15:51   CT Angio Chest PE W and/or Wo Contrast  Result Date: 05/11/2020 CLINICAL DATA:  Shortness of breath and weakness for several days EXAM: CT ANGIOGRAPHY CHEST WITH CONTRAST TECHNIQUE: Multidetector CT imaging of the chest was performed using the standard protocol during bolus administration of intravenous contrast. Multiplanar CT image reconstructions and MIPs were obtained to evaluate the vascular anatomy. CONTRAST:  58mL OMNIPAQUE IOHEXOL 350 MG/ML SOLN COMPARISON:  Chest x-ray from earlier in the same day, CTA from 07/12/2018. FINDINGS: Cardiovascular: Thoracic aorta demonstrates atherosclerotic calcifications. No aneurysmal dilatation or dissection is noted. No cardiac enlargement is seen. Coronary calcifications are noted. The pulmonary artery shows a normal branching pattern. No definitive filling defect is identified to suggest pulmonary embolism. Mediastinum/Nodes: Thoracic inlet shows a hypodensity within the left lobe of the thyroid measuring 17 mm in greatest dimension. This is relatively stable from a prior CT examination from 2019. No sizable hilar or mediastinal adenopathy is noted. The esophagus demonstrates some  diffuse wall thickening which may be related to reflux esophagitis. Lungs/Pleura: Bilateral moderate pleural effusions are seen. Associated lower lobe consolidation is noted.  No sizable parenchymal nodules are seen. No pneumothorax is noted. Upper Abdomen: Visualized upper abdomen shows geographic decreased attenuation within the left kidney and enlargement of the left kidney. This may simply be related to hydronephrosis. The possibility of an infiltrating mass deserves consideration. This can be further evaluated with CT of the abdomen and pelvis with contrast. Musculoskeletal: No acute bony abnormality is noted. Review of the MIP images confirms the above findings. IMPRESSION: No evidence of pulmonary emboli. Bilateral pleural effusions and lower lobe consolidation similar to that seen on recent chest x-ray. This is consistent with bibasilar pneumonia and reactive effusions. Diffuse wall thickening throughout the esophagus likely related to reflux esophagitis. Clinical correlation is recommended. It should be noted this is stable from a prior exam of 2019. Enlargement of the left kidney with decreased attenuation identified. Although this may simply represent hydronephrosis possibility of an underlying infiltrative mass deserves consideration. CT of the abdomen and pelvis with contrast is recommended. 17 mm left thyroid nodule. This is roughly stable from the prior CT examination from 2019 and likely benign. Recommend thyroid US (ref: J Am Coll Radiol. 2015 Feb;12(2): 143-50). Electronically Signed   By: Inez Catalina M.D.   On: 05/11/2020 00:00   CT ABDOMEN PELVIS W CONTRAST  Result Date: 05/13/2020 CLINICAL DATA:  LEFT renal mass. Suspicion for renal mass on prior chest CT. EXAM: CT ABDOMEN AND PELVIS WITH CONTRAST TECHNIQUE: Multidetector CT imaging of the abdomen and pelvis was performed using the standard protocol following bolus administration of intravenous contrast. CONTRAST:  129mL OMNIPAQUE IOHEXOL 300  MG/ML  SOLN COMPARISON:  Chest CT of May 10, 2020 FINDINGS: Lower chest: Loculated bilateral basilar effusions with similar appearance when compared to the study of May 10, 2020. Basilar airspace disease with increasing airspace disease particularly at the LEFT lung base. Hiatal hernia and patulous distal esophagus. Calcified coronary artery disease partially imaged. No pericardial effusion. Hepatobiliary: Liver without focal, suspicious lesion. Portal vein is patent. No pericholecystic stranding. No biliary duct dilation. Pancreas: Pancreas is normal without peripancreatic stranding, ductal dilation or inflammation. Spleen: Spleen normal in size and contour. Adrenals/Urinary Tract: Adrenal glands are normal. Moderate to marked LEFT-sided hydroureteronephrosis with overlying cortical thinning. Large calculus in the proximal LEFT ureter measuring 9 x 8 x 8 mm. No perinephric abscess. Urinary bladder is normal. Normal enhancement of the RIGHT kidney. Enhancement of the kidneys despite scarring and dilation is relatively symmetric and there is an additional small calculus to moderate sized calculus in the lower pole the LEFT kidney. Area with limited assessment due to motion artifact. Stomach/Bowel: Hiatal hernia and some distal esophageal thickening as described. Small duodenal diverticulum in the second portion of the duodenum. No acute gastrointestinal process. Appendix not visualized. Motion artifact in this area limiting assessment but no gross findings of inflammation that would indicate pericecal stranding or secondary signs of acute appendicitis. Colonic interposition along the anterior portion of the liver. No pericolonic stranding. No perirectal stranding. Vascular/Lymphatic: Calcific and noncalcific atheromatous plaque of the abdominal aorta. No aneurysmal dilation. No adenopathy in the retroperitoneum or upper abdomen. No pelvic lymphadenopathy. Reproductive: Heterogeneous prostate. Calcifications of the  vas deferens. Other: No ascites. Musculoskeletal: No acute or destructive bone finding. Spinal degenerative changes. IMPRESSION: 1. Moderate to marked hydronephrosis due to calculus in the proximal to mid LEFT ureter. Urologic consultation and correlation with urine studies to exclude concomitant infection is suggested. 2. Cortical thinning that is seen on the LEFT and preservation of renal enhancement suggests that an element  of this process could potentially be chronic. 3. Loculated effusions and worsening of LEFT lower lobe airspace disease that may represent a combination of volume loss and pneumonitis 4. Loculated bilateral basilar effusions with similar appearance when compared to the study of May 10, 2020. Basilar airspace disease with increasing airspace disease particularly at the LEFT lung base. Findings may represent worsening pneumonia or aspiration. 5. Hiatal hernia and patulous distal esophagus. 6. Aortic atherosclerosis. Aortic Atherosclerosis (ICD10-I70.0). Electronically Signed   By: Zetta Bills M.D.   On: 05/13/2020 12:52   DG CHEST PORT 1 VIEW  Result Date: 05/16/2020 CLINICAL DATA:  Pneumothorax post recent thoracentesis, shortness of breath EXAM: PORTABLE CHEST 1 VIEW COMPARISON:  Portable exam 0849 hours compared to 05/14/2020 FINDINGS: Stable heart size and mediastinal contours. RIGHT hydropneumothorax, with increased collapse of RIGHT lung since previous exam. Minimal LEFT pleural effusion with atelectasis versus infiltrate LEFT lower lobe. LEFT upper lung clear. Bones demineralized. IMPRESSION: RIGHT hydropneumothorax with increased collapse of RIGHT lung since previous exam. Minimal LEFT pleural effusion with atelectasis versus infiltrate at LEFT lower lobe. Findings called to Dr. Elsworth Soho on 05/16/2020 at 0922 hours. Electronically Signed   By: Lavonia Dana M.D.   On: 05/16/2020 09:38   DG Chest Port 1 View  Result Date: 05/10/2020 CLINICAL DATA:  Questionable sepsis.  Pneumonia  diagnosis yesterday. EXAM: PORTABLE CHEST 1 VIEW COMPARISON:  Radiograph yesterday. FINDINGS: Upper normal heart size. Unchanged mediastinal contours. Hazy symmetric bibasilar opacities are unchanged from radiograph yesterday. No pulmonary edema. No pneumothorax. IMPRESSION: Unchanged symmetric bibasilar opacities are unchanged from yesterday, likely combination of pleural effusions and atelectasis/pneumonia. Electronically Signed   By: Keith Rake M.D.   On: 05/10/2020 21:24   DG Chest Port 1 View  Result Date: 05/09/2020 CLINICAL DATA:  Cough and fever. EXAM: PORTABLE CHEST 1 VIEW COMPARISON:  09/06/2018 FINDINGS: Heart size is normal. There are bilateral pleural effusions and infiltrate/atelectasis in both lower lungs, consistent with pneumonia. Upper lungs are clear. IMPRESSION: Bilateral lower lobe atelectasis and pneumonia.  Small effusions. Electronically Signed   By: Nelson Chimes M.D.   On: 05/09/2020 14:16   DG Chest Port 1V same Day  Result Date: 05/14/2020 CLINICAL DATA:  Follow-up right-sided pneumothorax. EXAM: PORTABLE CHEST 1 VIEW COMPARISON:  May 14, 2020 (3:02 p.m.) FINDINGS: Stable moderate to marked severity atelectasis and/or infiltrate is noted within the right lung base with stable mild to moderate severity left basilar atelectasis and/or infiltrate seen. There is a small, stable left pleural effusion with a stable moderate sized right pleural effusion. A stable moderate sized right apical pneumothorax is seen. A mild amount of radiopaque contrast is again seen along the infrahilar region on the right. The heart size and mediastinal contours are within normal limits. The visualized skeletal structures are unremarkable. IMPRESSION: No significant interval change when compared to the prior study dated May 14, 2020 (3:02 p.m.) Electronically Signed   By: Virgina Norfolk M.D.   On: 05/14/2020 19:27   DG Swallowing Func-Speech Pathology  Result Date: 05/14/2020 Objective  Swallowing Evaluation: Type of Study: MBS-Modified Barium Swallow Study  Patient Details Name: Koji Niehoff MRN: [id number] Date of Birth: Dec 25, 1960 Today's Date: 05/14/2020 Time: SLP Start Time (ACUTE ONLY): 39 -SLP Stop Time (ACUTE ONLY): 1300 SLP Time Calculation (min) (ACUTE ONLY): 30 min Past Medical History: Past Medical History: Diagnosis Date . Cocaine abuse ([place])  . ETOH abuse  . GERD (gastroesophageal reflux disease)  . Hyperlipidemia  . 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 BIOPSY  08/25/2018  Procedure: BIOPSY;  Surgeon: [name], MD;  [facility];  Service: Endoscopy;; . BIOPSY  12/13/2018  Procedure: BIOPSY;  Surgeon: [name], MD;  [facility];  Service: Endoscopy;;  esophagus . COLONOSCOPY WITH PROPOFOL N/A 04/07/2019  Dr. [name]: 3 Tubular adenomas removed (4 to 8 mm in size), next colonoscopy 3 years. . ESOPHAGOGASTRODUODENOSCOPY (EGD) WITH PROPOFOL N/A 08/25/2018  Dr. [name]: Severely inflamed abnormal appearing mid/distal esophagus.  Encroachment on lumen suspicious for infiltrating neoplasm located be all from severe benign inflammation, extensive gastric erosions and extensive duodenal ulcerations.  Query ischemic process versus other.  Biopsy revealed Barrett's esophagus but negative for malignancy and H. pylori. . ESOPHAGOGASTRODUODENOSCOPY (EGD) WITH PROPOFOL N/A 12/13/2018  Dr. [name]: peptic stricture s/p balloon dilatation. abnormal esophageal mucosa, by c/w barrett's without dysplasia, previous PUD completely healed.  Marland Kitchen POLYPECTOMY  04/07/2019  Procedure: POLYPECTOMY;  Surgeon: [name], MD;  [facility];  Service: Endoscopy;;  colon HPI: 59 y.o. male with medical history significant for prior stroke, hyperlipidemia, GERD, BPH, barrett's esophagus    and remote history of cocaine abuse who presents to the emergency department due to 5 days of  weakness, complains of productive cough with clear phlegm with occasional black speckles with associated reproducible midsternal chest pain. Patient was seen in the ED 2 days ago (7/28) due to similar symptoms, during which he reported a max temperature of 100F.  Chest x-ray done at that time showed pneumonia and patient was treated with IV fluid.  Amoxicillin and doxycycline were prescribed on the was advised to follow-up with his primary care physician.  Patient returned to the ED yesterday due to increased heart rate and sob.  He states that he was taking his blood pressure medication as prescribed and denies cocaine use. Bilateral pleural effusions and lower lobe consolidation similar to that seen on recent chest x-ray. This is consistent with bibasilar pneumonia and reactive effusions. Diffuse wall thickening throughout the esophagus likely related to reflux esophagitis. Clinical correlation is recommended. It should be noted this is stable from a prior exam of 2019.  Subjective: "My chest hurts when I am coughing sometimes." Assessment / Plan / Recommendation CHL IP CLINICAL IMPRESSIONS 05/14/2020 Clinical Impression Pt presents with mild oropharyngeal phase dysphagia characterized by lingual pumping with reduced bolus cohesiveness and premature spillage of liquids to the valleculae. Hyolaryngeal excursion appeared WNL. Pt without penetration/aspiration across consistencies/textures and only with trace amounts of thin liquid residuals along base of tongue and in lateral channels which cleared with secondary swallow. Pt is currently on 4L O2 via nasal cannula. Risk for aspiration with po intake appears minimal, however his respiratory status slightly increases his risk. Pt also with history of esophageal dysphagia. Recommend regular textures and thin liquids with standard aspiration and reflux precautions. No further SLP services indicated at this time. This study was reviewed with Dr. Elsworth Soho (pulmonary).  SLP  Visit Diagnosis Dysphagia, oropharyngeal phase (R13.12) Attention and concentration deficit following -- Frontal lobe and executive function deficit following -- Impact on safety and function Mild aspiration risk   CHL IP TREATMENT RECOMMENDATION 05/14/2020 Treatment Recommendations No treatment recommended at this time   Prognosis 05/14/2020 Prognosis for Safe Diet Advancement Good Barriers to Reach Goals -- Barriers/Prognosis Comment -- CHL IP DIET RECOMMENDATION 05/14/2020 SLP Diet Recommendations Regular solids;Thin liquid Liquid Administration via Cup;Straw Medication Administration Whole meds with  liquid Compensations Slow rate;Small sips/bites Postural Changes Seated upright at 90 degrees;Remain semi-upright after after feeds/meals (Comment)   CHL IP OTHER RECOMMENDATIONS 05/14/2020 Recommended Consults -- Oral Care Recommendations Oral care BID Other Recommendations Clarify dietary restrictions   CHL IP FOLLOW UP RECOMMENDATIONS 05/14/2020 Follow up Recommendations None   CHL IP FREQUENCY AND DURATION 05/14/2020 Speech Therapy Frequency (ACUTE ONLY) min 2x/week Treatment Duration 1 week      CHL IP ORAL PHASE 05/14/2020 Oral Phase Impaired Oral - Pudding Teaspoon -- Oral - Pudding Cup -- Oral - Honey Teaspoon -- Oral - Honey Cup -- Oral - Nectar Teaspoon -- Oral - Nectar Cup -- Oral - Nectar Straw -- Oral - Thin Teaspoon Premature spillage Oral - Thin Cup Premature spillage;Decreased bolus cohesion Oral - Thin Straw Premature spillage Oral - Puree Lingual pumping Oral - Mech Soft -- Oral - Regular Delayed oral transit Oral - Multi-Consistency -- Oral - Pill -- Oral Phase - Comment --  CHL IP PHARYNGEAL PHASE 05/14/2020 Pharyngeal Phase Impaired Pharyngeal- Pudding Teaspoon -- Pharyngeal -- Pharyngeal- Pudding Cup -- Pharyngeal -- Pharyngeal- Honey Teaspoon -- Pharyngeal -- Pharyngeal- Honey Cup -- Pharyngeal -- Pharyngeal- Nectar Teaspoon -- Pharyngeal -- Pharyngeal- Nectar Cup -- Pharyngeal -- Pharyngeal- Nectar Straw --  Pharyngeal -- Pharyngeal- Thin Teaspoon Delayed swallow initiation-vallecula Pharyngeal -- Pharyngeal- Thin Cup Delayed swallow initiation-vallecula;Lateral channel residue Pharyngeal -- Pharyngeal- Thin Straw Lateral channel residue;Delayed swallow initiation-vallecula Pharyngeal -- Pharyngeal- Puree Delayed swallow initiation-vallecula;WFL Pharyngeal -- Pharyngeal- Mechanical Soft -- Pharyngeal -- Pharyngeal- Regular WFL Pharyngeal -- Pharyngeal- Multi-consistency -- Pharyngeal -- Pharyngeal- Pill WFL Pharyngeal -- Pharyngeal Comment --  CHL IP CERVICAL ESOPHAGEAL PHASE 05/14/2020 Cervical Esophageal Phase WFL Pudding Teaspoon -- Pudding Cup -- Honey Teaspoon -- Honey Cup -- Nectar Teaspoon -- Nectar Cup -- Nectar Straw -- Thin Teaspoon -- Thin Cup -- Thin Straw -- Puree -- Mechanical Soft -- Regular -- Multi-consistency -- Pill -- Cervical Esophageal Comment -- Thank you, Genene Churn, [place] PORTER,DABNEY 05/14/2020, 2:21 PM              ECHOCARDIOGRAM COMPLETE  Result Date: 05/11/2020    ECHOCARDIOGRAM REPORT   Patient Name:   OBINNA EHRESMAN Osuch Date of Exam: 05/11/2020 Medical Rec #:  [id number]           Height:       69.0 in Accession #:    [id number]          Weight:       153.0 lb Date of Birth:  1960-10-25           BSA:          1.844 m Patient Age:    71 years            BP:           141/90 mmHg Patient Gender: M                   HR:           100 bpm. Exam Location:  [facility] Procedure: 2D Echo, Cardiac Doppler and Color Doppler Indications:    CHF  History:        Patient has prior history of Echocardiogram examinations, most                 recent 07/13/2018. CHF, Stroke, Signs/Symptoms:Shortness of                 Breath; Risk Factors:Hypertension and Dyslipidemia. Cocaine  abuse, Pneumonia.  Sonographer:    Dustin Flock RDCS Referring Phys: [id number] [place]  1. Left ventricular ejection fraction, by estimation, is 65 to 70%. The left ventricle  has normal function. The left ventricle has no regional wall motion abnormalities. Left ventricular diastolic parameters are consistent with Grade I diastolic dysfunction (impaired relaxation).  2. Right ventricular systolic function is normal. The right ventricular size is normal. Tricuspid regurgitation signal is inadequate for assessing PA pressure.  3. The mitral valve is grossly normal. Trivial mitral valve regurgitation.  4. The aortic valve is tricuspid. Aortic valve regurgitation is not visualized.  5. The inferior vena cava is normal in size with greater than 50% respiratory variability, suggesting right atrial pressure of 3 mmHg. FINDINGS  Left Ventricle: Left ventricular ejection fraction, by estimation, is 65 to 70%. The left ventricle has normal function. The left ventricle has no regional wall motion abnormalities. The left ventricular internal cavity size was normal in size. There is  no left ventricular hypertrophy. Left ventricular diastolic parameters are consistent with Grade I diastolic dysfunction (impaired relaxation). Right Ventricle: The right ventricular size is normal. No increase in right ventricular wall thickness. Right ventricular systolic function is normal. Tricuspid regurgitation signal is inadequate for assessing PA pressure. Left Atrium: Left atrial size was normal in size. Right Atrium: Right atrial size was normal in size. Pericardium: There is no evidence of pericardial effusion. Mitral Valve: The mitral valve is grossly normal. Trivial mitral valve regurgitation. Tricuspid Valve: The tricuspid valve is grossly normal. Tricuspid valve regurgitation is mild. Aortic Valve: The aortic valve is tricuspid. Aortic valve regurgitation is not visualized. Pulmonic Valve: The pulmonic valve was grossly normal. Pulmonic valve regurgitation is trivial. Aorta: The aortic root is normal in size and structure. Venous: The inferior vena cava is normal in size with greater than 50% respiratory  variability, suggesting right atrial pressure of 3 mmHg. IAS/Shunts: No atrial level shunt detected by color flow Doppler.  LEFT VENTRICLE PLAX 2D LVIDd:         4.30 cm  Diastology LVIDs:         2.68 cm  LV e' lateral:   6.42 cm/s LV PW:         0.82 cm  LV E/e' lateral: 11.1 LV IVS:        0.98 cm  LV e' medial:    5.11 cm/s LVOT diam:     2.20 cm  LV E/e' medial:  13.9 LV SV:         81 LV SV Index:   44 LVOT Area:     3.80 cm  RIGHT VENTRICLE RV Basal diam:  2.99 cm RV S prime:     21.60 cm/s TAPSE (M-mode): 3.1 cm LEFT ATRIUM             Index       RIGHT ATRIUM           Index LA diam:        3.50 cm 1.90 cm/m  RA Area:     14.20 cm LA Vol (A2C):   42.8 ml 23.21 ml/m RA Volume:   31.20 ml  16.92 ml/m LA Vol (A4C):   46.7 ml 25.33 ml/m LA Biplane Vol: 45.6 ml 24.73 ml/m  AORTIC VALVE LVOT Vmax:   124.00 cm/s LVOT Vmean:  72.900 cm/s LVOT VTI:    0.212 m  AORTA Ao Root diam: 3.00 cm MITRAL VALVE MV Area (PHT): 6.65 cm  SHUNTS MV Decel Time: 114 msec     Systemic VTI:  0.21 m MV E velocity: 71.10 cm/s   Systemic Diam: 2.20 cm MV A velocity: 113.00 cm/s MV E/A ratio:  0.63 Rozann Lesches MD Electronically signed by Rozann Lesches MD Signature Date/Time: 05/11/2020/4:35:39 PM    Final    US THORACENTESIS ASP PLEURAL SPACE W/IMG GUIDE  Result Date: 05/14/2020 INDICATION: Complicated RIGHT pleural effusion EXAM: ULTRASOUND GUIDED DIAGNOSTIC RIGHT THORACENTESIS MEDICATIONS: None. COMPLICATIONS: Probable RIGHT pneumothorax, see chest radiograph report PROCEDURE: An ultrasound guided thoracentesis was thoroughly discussed with the patient and questions answered. The benefits, risks, alternatives and complications were also discussed. The patient understands and wishes to proceed with the procedure. Written consent was obtained. Ultrasound was performed to localize and mark an adequate pocket of fluid in the posterior RIGHT chest. The area was then prepped and draped in the normal sterile fashion. 1%  Lidocaine was used for local anesthesia. Under ultrasound guidance a 5 [place] Yueh catheter was introduced. Thoracentesis was performed. The catheter was removed and a dressing applied. FINDINGS: A total of approximately 30 mL of cloudy yellow slightly thicker RIGHT pleural fluid was removed. Samples were sent to the laboratory as requested by the clinical team. IMPRESSION: Successful ultrasound guided diagnostic RIGHT thoracentesis yielding 30 mL of cloudy yellow RIGHT pleural fluid, cannot exclude infection/empyema with this appearance. Findings discussed with Dr. Elsworth Soho on 05/14/2020 at 1545 hours. Electronically Signed   By: Lavonia Dana M.D.   On: 05/14/2020 15:50    Mayank Teuscher Wynetta Emery, MD  How to contact the [facility] Attending or Consulting provider [place] or covering provider during after hours [place], for this patient?  1. Check the care team in [facility] and look for a) attending/consulting [facility] provider listed and b) the [facility] team listed 2. Log into [email or web address] and use [place]'s universal password to access. If you do not have the password, please contact the hospital operator. 3. Locate the [facility] provider you are looking for under [facility] and page to a number that you can be directly reached. 4. If you still have difficulty reaching the provider, please page the [facility] (Director on Call) for the Hospitalists listed on amion for assistance.  05/16/2020, 1:09 PM   LOS: 5 days

## 2020-05-16 NOTE — Progress Notes (Addendum)
Discussed with radiology CT shows esophageal perforation . Chest tube is in position but curling back Good pleural variation at bedside Will keep npo - Await surgical recommendations D/w GI & Gen surgery  Pt & mom made aware of diagnosis Will change to ICU status   Evan Chavez V. Elsworth Soho MD

## 2020-05-16 NOTE — Progress Notes (Signed)
[facility]  Dr. Elsworth Soho called and discussed patient. He has discussed with CT surgery. Unable to care for esophageal perforation at [facility]. NPO, will need evacuation/ drainage of the right pleural cavity, IV antibiotics. CT in place but not draining the large effusion/ empyema.    If additional services needed by me, happy to assist with what I am able to at [facility] but cannot offer thoracotomy/ VATs +/- esophageal stenting/ diversion, etc that the patient needs.   Curlene Labrum, MD [facility] [address] [place], [place] [address] [phone] (office)

## 2020-05-17 ENCOUNTER — Inpatient Hospital Stay (HOSPITAL_COMMUNITY): Payer: Medicaid Other | Admitting: Certified Registered"

## 2020-05-17 ENCOUNTER — Inpatient Hospital Stay (HOSPITAL_COMMUNITY): Payer: Medicaid Other

## 2020-05-17 ENCOUNTER — Encounter (HOSPITAL_COMMUNITY): Payer: Self-pay | Admitting: Internal Medicine

## 2020-05-17 ENCOUNTER — Ambulatory Visit: Payer: Medicaid Other

## 2020-05-17 ENCOUNTER — Encounter (HOSPITAL_COMMUNITY)
Admission: EM | Disposition: A | Discharge status: Skilled Nursing Facility | Payer: Self-pay | Source: Home / Self Care | Attending: Thoracic Surgery (Cardiothoracic Vascular Surgery)

## 2020-05-17 DIAGNOSIS — J869 Pyothorax without fistula: Secondary | ICD-10-CM

## 2020-05-17 DIAGNOSIS — K223 Perforation of esophagus: Secondary | ICD-10-CM | POA: Diagnosis present

## 2020-05-17 DIAGNOSIS — R0603 Acute respiratory distress: Secondary | ICD-10-CM

## 2020-05-17 DIAGNOSIS — E440 Moderate protein-calorie malnutrition: Secondary | ICD-10-CM | POA: Diagnosis present

## 2020-05-17 HISTORY — PX: ESOPHAGOGASTRODUODENOSCOPY: SHX5428

## 2020-05-17 HISTORY — PX: ESOPHAGEAL STENT PLACEMENT: SHX5540

## 2020-05-17 HISTORY — PX: VIDEO ASSISTED THORACOSCOPY (VATS)/DECORTICATION: SHX6171

## 2020-05-17 LAB — POCT ACTIVATED CLOTTING TIME: Activated Clotting Time: 0 seconds

## 2020-05-17 LAB — POCT I-STAT 7, (LYTES, BLD GAS, ICA,H+H)
Acid-Base Excess: 1 mmol/L (ref 0.0–2.0)
Bicarbonate: 26.2 mmol/L (ref 20.0–28.0)
Calcium, Ion: 1 mmol/L — ABNORMAL LOW (ref 1.15–1.40)
HCT: 29 % — ABNORMAL LOW (ref 39.0–52.0)
Hemoglobin: 9.9 g/dL — ABNORMAL LOW (ref 13.0–17.0)
O2 Saturation: 94 %
Patient temperature: 97.3
Potassium: 4 mmol/L (ref 3.5–5.1)
Sodium: 142 mmol/L (ref 135–145)
TCO2: 27 mmol/L (ref 22–32)
pCO2 arterial: 41.8 mmHg (ref 32.0–48.0)
pH, Arterial: 7.402 (ref 7.350–7.450)
pO2, Arterial: 69 mmHg — ABNORMAL LOW (ref 83.0–108.0)

## 2020-05-17 LAB — GLUCOSE, CAPILLARY
Glucose-Capillary: 102 mg/dL — ABNORMAL HIGH (ref 70–99)
Glucose-Capillary: 91 mg/dL (ref 70–99)
Glucose-Capillary: 96 mg/dL (ref 70–99)
Glucose-Capillary: 111 mg/dL — ABNORMAL HIGH (ref 70–99)
Glucose-Capillary: 155 mg/dL — ABNORMAL HIGH (ref 70–99)

## 2020-05-17 LAB — MRSA PCR SCREENING: MRSA by PCR: NEGATIVE

## 2020-05-17 SURGERY — VIDEO ASSISTED THORACOSCOPY (VATS)/DECORTICATION
Anesthesia: General | Site: Esophagus | Laterality: Right

## 2020-05-17 MED ORDER — ENOXAPARIN SODIUM 40 MG/0.4ML ~~LOC~~ SOLN: 40.0000 mg | SUBCUTANEOUS | Status: DC

## 2020-05-17 MED ORDER — FENTANYL CITRATE (PF) 100 MCG/2ML IJ SOLN
25.0000 ug | Freq: Once | INTRAMUSCULAR | Status: AC
  Administered 2020-05-17: 25 ug via INTRAVENOUS
  Filled 2020-05-17: qty 2

## 2020-05-17 MED ORDER — ENOXAPARIN SODIUM 40 MG/0.4ML ~~LOC~~ SOLN
40.0000 mg | Freq: Every day | SUBCUTANEOUS | Status: DC
  Administered 2020-05-18 – 2020-05-23 (×6): 40 mg via SUBCUTANEOUS
  Filled 2020-05-17 (×6): qty 0.4

## 2020-05-17 MED ORDER — PHENYLEPHRINE 40 MCG/ML (10ML) SYRINGE FOR IV PUSH (FOR BLOOD PRESSURE SUPPORT)
PREFILLED_SYRINGE | INTRAVENOUS | Status: DC | PRN
  Administered 2020-05-17: 80 ug via INTRAVENOUS

## 2020-05-17 MED ORDER — BUPIVACAINE LIPOSOME 1.3 % IJ SUSP
20.0000 mL | Freq: Once | INTRAMUSCULAR | Status: DC
  Filled 2020-05-17: qty 20

## 2020-05-17 MED ORDER — ONDANSETRON HCL 4 MG/2ML IJ SOLN
INTRAMUSCULAR | Status: AC
  Filled 2020-05-17: qty 2

## 2020-05-17 MED ORDER — PROPOFOL 10 MG/ML IV BOLUS
INTRAVENOUS | Status: AC
  Filled 2020-05-17: qty 20

## 2020-05-17 MED ORDER — SUGAMMADEX SODIUM 200 MG/2ML IV SOLN
INTRAVENOUS | Status: DC | PRN
  Administered 2020-05-17: 130 mg via INTRAVENOUS
  Administered 2020-05-17: 70 mg via INTRAVENOUS

## 2020-05-17 MED ORDER — 0.9 % SODIUM CHLORIDE (POUR BTL) OPTIME
TOPICAL | Status: DC | PRN
  Administered 2020-05-17: 3000 mL

## 2020-05-17 MED ORDER — HYDROMORPHONE HCL 1 MG/ML IJ SOLN
0.2500 mg | INTRAMUSCULAR | Status: DC | PRN
  Administered 2020-05-17 (×2): 0.5 mg via INTRAVENOUS

## 2020-05-17 MED ORDER — HYDRALAZINE HCL 20 MG/ML IJ SOLN
10.0000 mg | INTRAMUSCULAR | Status: DC | PRN
  Administered 2020-05-17 – 2020-05-20 (×2): 10 mg via INTRAVENOUS
  Filled 2020-05-17 (×2): qty 1

## 2020-05-17 MED ORDER — ACETAMINOPHEN 500 MG PO TABS
1000.0000 mg | ORAL_TABLET | Freq: Four times a day (QID) | ORAL | Status: DC
  Filled 2020-05-17: qty 2

## 2020-05-17 MED ORDER — ONDANSETRON HCL 4 MG/2ML IJ SOLN
INTRAMUSCULAR | Status: DC | PRN
  Administered 2020-05-17: 4 mg via INTRAVENOUS

## 2020-05-17 MED ORDER — FLUCONAZOLE IN SODIUM CHLORIDE 400-0.9 MG/200ML-% IV SOLN
400.0000 mg | INTRAVENOUS | Status: DC
  Administered 2020-05-17 – 2020-06-04 (×18): 400 mg via INTRAVENOUS
  Filled 2020-05-17 (×20): qty 200

## 2020-05-17 MED ORDER — LACTATED RINGERS IV SOLN: INTRAVENOUS | Status: DC

## 2020-05-17 MED ORDER — BUPIVACAINE HCL (PF) 0.5 % IJ SOLN
INTRAMUSCULAR | Status: AC
  Filled 2020-05-17: qty 30

## 2020-05-17 MED ORDER — CHLORHEXIDINE GLUCONATE 0.12 % MT SOLN
15.0000 mL | OROMUCOSAL | Status: AC
  Administered 2020-05-17: 15 mL via OROMUCOSAL
  Filled 2020-05-17: qty 15

## 2020-05-17 MED ORDER — MIDAZOLAM HCL 2 MG/2ML IJ SOLN
INTRAMUSCULAR | Status: AC
  Filled 2020-05-17: qty 2

## 2020-05-17 MED ORDER — FENTANYL CITRATE (PF) 100 MCG/2ML IJ SOLN
INTRAMUSCULAR | Status: AC
  Filled 2020-05-17: qty 2

## 2020-05-17 MED ORDER — IPRATROPIUM-ALBUTEROL 0.5-2.5 (3) MG/3ML IN SOLN
3.0000 mL | Freq: Three times a day (TID) | RESPIRATORY_TRACT | Status: DC
  Administered 2020-05-17 – 2020-05-18 (×2): 3 mL via RESPIRATORY_TRACT
  Filled 2020-05-17 (×2): qty 3

## 2020-05-17 MED ORDER — HYDROMORPHONE HCL 1 MG/ML IJ SOLN
INTRAMUSCULAR | Status: AC
  Filled 2020-05-17: qty 1

## 2020-05-17 MED ORDER — IODIXANOL 320 MG/ML IV SOLN
INTRAVENOUS | Status: DC | PRN
  Administered 2020-05-17: 40 mL

## 2020-05-17 MED ORDER — FENTANYL CITRATE (PF) 250 MCG/5ML IJ SOLN
INTRAMUSCULAR | Status: AC
  Filled 2020-05-17: qty 5

## 2020-05-17 MED ORDER — SODIUM CHLORIDE 0.9 % IV SOLN: INTRAVENOUS | Status: DC | PRN

## 2020-05-17 MED ORDER — MIDAZOLAM HCL 5 MG/5ML IJ SOLN
INTRAMUSCULAR | Status: DC | PRN
  Administered 2020-05-17: 1 mg via INTRAVENOUS

## 2020-05-17 MED ORDER — BISACODYL 5 MG PO TBEC
10.0000 mg | DELAYED_RELEASE_TABLET | Freq: Every day | ORAL | Status: DC
  Administered 2020-05-18 – 2020-06-13 (×11): 10 mg via ORAL
  Filled 2020-05-17 (×17): qty 2

## 2020-05-17 MED ORDER — ACETAMINOPHEN 160 MG/5ML PO SOLN
1000.0000 mg | Freq: Four times a day (QID) | ORAL | Status: DC
  Administered 2020-05-18 (×2): 1000 mg via ORAL
  Filled 2020-05-17 (×2): qty 40.6

## 2020-05-17 MED ORDER — KETOROLAC TROMETHAMINE 15 MG/ML IJ SOLN
15.0000 mg | Freq: Four times a day (QID) | INTRAMUSCULAR | Status: AC | PRN
  Administered 2020-05-18 – 2020-05-20 (×6): 15 mg via INTRAVENOUS
  Filled 2020-05-17 (×7): qty 1

## 2020-05-17 MED ORDER — ONDANSETRON HCL 4 MG/2ML IJ SOLN: 4.0000 mg | Freq: Four times a day (QID) | INTRAMUSCULAR | Status: DC | PRN

## 2020-05-17 MED ORDER — SENNOSIDES-DOCUSATE SODIUM 8.6-50 MG PO TABS
1.0000 | ORAL_TABLET | Freq: Every day | ORAL | Status: DC
  Administered 2020-05-18: 1 via ORAL
  Filled 2020-05-17: qty 1

## 2020-05-17 MED ORDER — FENTANYL CITRATE (PF) 100 MCG/2ML IJ SOLN
25.0000 ug | INTRAMUSCULAR | Status: AC | PRN
  Administered 2020-05-17 – 2020-05-18 (×2): 25 ug via INTRAVENOUS
  Filled 2020-05-17 (×2): qty 2

## 2020-05-17 MED ORDER — LACTATED RINGERS IV SOLN: INTRAVENOUS | Status: DC | PRN

## 2020-05-17 MED ORDER — SUCCINYLCHOLINE CHLORIDE 20 MG/ML IJ SOLN
INTRAMUSCULAR | Status: DC | PRN
  Administered 2020-05-17: 30 mg via INTRAVENOUS
  Administered 2020-05-17: 100 mg via INTRAVENOUS
  Administered 2020-05-17: 30 mg via INTRAVENOUS

## 2020-05-17 MED ORDER — ROCURONIUM BROMIDE 10 MG/ML (PF) SYRINGE
PREFILLED_SYRINGE | INTRAVENOUS | Status: DC | PRN
  Administered 2020-05-17: 100 mg via INTRAVENOUS
  Administered 2020-05-17: 20 mg via INTRAVENOUS

## 2020-05-17 MED ORDER — LIDOCAINE 2% (20 MG/ML) 5 ML SYRINGE
INTRAMUSCULAR | Status: DC | PRN
  Administered 2020-05-17: 100 mg via INTRAVENOUS

## 2020-05-17 MED ORDER — FENTANYL CITRATE (PF) 100 MCG/2ML IJ SOLN
INTRAMUSCULAR | Status: DC | PRN
  Administered 2020-05-17: 50 ug via INTRAVENOUS
  Administered 2020-05-17: 100 ug via INTRAVENOUS
  Administered 2020-05-17 (×3): 50 ug via INTRAVENOUS
  Administered 2020-05-17: 150 ug via INTRAVENOUS

## 2020-05-17 MED ORDER — DEXAMETHASONE SODIUM PHOSPHATE 10 MG/ML IJ SOLN
INTRAMUSCULAR | Status: DC | PRN
  Administered 2020-05-17: 10 mg via INTRAVENOUS

## 2020-05-17 MED ORDER — SODIUM CHLORIDE 0.9 % IV SOLN: INTRAVENOUS | Status: DC

## 2020-05-17 MED ORDER — ESMOLOL HCL 100 MG/10ML IV SOLN
INTRAVENOUS | Status: DC | PRN
Start: 2020-05-17 — End: 2020-05-17
  Administered 2020-05-17: 20 mg via INTRAVENOUS
  Administered 2020-05-17: 30 mg via INTRAVENOUS

## 2020-05-17 MED ORDER — BUPIVACAINE HCL (PF) 0.5 % IJ SOLN
INTRAMUSCULAR | Status: DC | PRN
  Administered 2020-05-17: 30 mL

## 2020-05-17 MED ORDER — PHENYLEPHRINE 40 MCG/ML (10ML) SYRINGE FOR IV PUSH (FOR BLOOD PRESSURE SUPPORT)
PREFILLED_SYRINGE | INTRAVENOUS | Status: AC
  Filled 2020-05-17: qty 10

## 2020-05-17 MED ORDER — PROPOFOL 10 MG/ML IV BOLUS
INTRAVENOUS | Status: DC | PRN
  Administered 2020-05-17: 20 mg via INTRAVENOUS
  Administered 2020-05-17: 30 mg via INTRAVENOUS
  Administered 2020-05-17: 40 mg via INTRAVENOUS
  Administered 2020-05-17: 200 mg via INTRAVENOUS

## 2020-05-17 MED ORDER — PHENYLEPHRINE HCL-NACL 10-0.9 MG/250ML-% IV SOLN
INTRAVENOUS | Status: DC | PRN
  Administered 2020-05-17: 50 ug/min via INTRAVENOUS

## 2020-05-17 SURGICAL SUPPLY — 102 items
APPLICATOR TIP COSEAL (VASCULAR PRODUCTS) IMPLANT
APPLICATOR TIP EXT COSEAL (VASCULAR PRODUCTS) IMPLANT
BLADE CLIPPER SURG (BLADE) IMPLANT
BLADE SURG 11 STRL SS (BLADE) ×5 IMPLANT
BUTTON OLYMPUS DEFENDO 5 PIECE (MISCELLANEOUS) ×5 IMPLANT
CANISTER SUCT 3000ML PPV (MISCELLANEOUS) ×5 IMPLANT
CATH THORACIC 28FR (CATHETERS) IMPLANT
CATH THORACIC 36FR (CATHETERS) IMPLANT
CATH THORACIC 36FR RT ANG (CATHETERS) IMPLANT
CHLORAPREP W/TINT 26 (MISCELLANEOUS) ×10 IMPLANT
CLEANER TIP ELECTROSURG 2X2 (MISCELLANEOUS) IMPLANT
CNTNR URN SCR LID CUP LEK RST (MISCELLANEOUS) ×9 IMPLANT
CONN ST 1/4X3/8  BEN (MISCELLANEOUS) ×2
CONN ST 1/4X3/8 BEN (MISCELLANEOUS) ×3 IMPLANT
CONN Y 3/8X3/8X3/8  BEN (MISCELLANEOUS)
CONN Y 3/8X3/8X3/8 BEN (MISCELLANEOUS) IMPLANT
CONT SPEC 4OZ STRL OR WHT (MISCELLANEOUS) ×6
COVER BACK TABLE 60X90IN (DRAPES) IMPLANT
COVER SURGICAL LIGHT HANDLE (MISCELLANEOUS) ×5 IMPLANT
DEFOGGER SCOPE WARMER CLEARIFY (MISCELLANEOUS) ×5 IMPLANT
DERMABOND ADVANCED (GAUZE/BANDAGES/DRESSINGS) ×2
DERMABOND ADVANCED .7 DNX12 (GAUZE/BANDAGES/DRESSINGS) ×3 IMPLANT
DISSECTOR BLUNT TIP ENDO 5MM (MISCELLANEOUS) IMPLANT
DRAIN CHANNEL 19F RND (DRAIN) ×5 IMPLANT
DRAIN CHANNEL 28F RND 3/8 FF (WOUND CARE) IMPLANT
DRAIN CONNECTOR BLAKE 1:1 (MISCELLANEOUS) ×5 IMPLANT
DRAPE CV SPLIT W-CLR ANES SCRN (DRAPES) ×5 IMPLANT
DRAPE LAPAROSCOPIC ABDOMINAL (DRAPES) ×5 IMPLANT
DRAPE ORTHO SPLIT 77X108 STRL (DRAPES) ×2
DRAPE SURG ORHT 6 SPLT 77X108 (DRAPES) ×3 IMPLANT
DRAPE UNIVERSAL PACK (DRAPES) ×5 IMPLANT
DRAPE WARM FLUID 44X44 (DRAPES) IMPLANT
ELECT BLADE 4.0 EZ CLEAN MEGAD (MISCELLANEOUS) ×5
ELECT REM PT RETURN 9FT ADLT (ELECTROSURGICAL) ×5
ELECTRODE BLDE 4.0 EZ CLN MEGD (MISCELLANEOUS) ×3 IMPLANT
ELECTRODE REM PT RTRN 9FT ADLT (ELECTROSURGICAL) ×3 IMPLANT
EVACUATOR SILICONE 100CC (DRAIN) ×5 IMPLANT
FORCEPS GRASP COMBO 8X230 (FORCEP) ×5 IMPLANT
GAUZE SPONGE 4X4 12PLY STRL (GAUZE/BANDAGES/DRESSINGS) ×5 IMPLANT
GLOVE BIO SURGEON STRL SZ7.5 (GLOVE) ×25 IMPLANT
GOWN STRL REUS W/ TWL LRG LVL3 (GOWN DISPOSABLE) ×12 IMPLANT
GOWN STRL REUS W/ TWL XL LVL3 (GOWN DISPOSABLE) ×3 IMPLANT
GOWN STRL REUS W/TWL LRG LVL3 (GOWN DISPOSABLE) ×8
GOWN STRL REUS W/TWL XL LVL3 (GOWN DISPOSABLE) ×2
KIT BASIN OR (CUSTOM PROCEDURE TRAY) ×5 IMPLANT
KIT SUCTION CATH 14FR (SUCTIONS) ×5 IMPLANT
KIT TURNOVER KIT B (KITS) ×5 IMPLANT
MARKER SKIN DUAL TIP RULER LAB (MISCELLANEOUS) ×10 IMPLANT
NEEDLE HYPO 25GX1X1/2 BEV (NEEDLE) IMPLANT
NS IRRIG 1000ML POUR BTL (IV SOLUTION) ×15 IMPLANT
OIL SILICONE PENTAX (PARTS (SERVICE/REPAIRS)) ×5 IMPLANT
PACK CHEST (CUSTOM PROCEDURE TRAY) ×5 IMPLANT
PAD ARMBOARD 7.5X6 YLW CONV (MISCELLANEOUS) ×10 IMPLANT
PASSER SUT SWANSON 36MM LOOP (INSTRUMENTS) IMPLANT
SCISSORS LAP 5X35 DISP (ENDOMECHANICALS) IMPLANT
SEALANT PROGEL (MISCELLANEOUS) IMPLANT
SEALANT SURG COSEAL 4ML (VASCULAR PRODUCTS) IMPLANT
SEALANT SURG COSEAL 8ML (VASCULAR PRODUCTS) IMPLANT
STENT WALLFLEX ESOPH 23X125MM (Stent) ×5 IMPLANT
STOPCOCK 4 WAY LG BORE MALE ST (IV SETS) ×5 IMPLANT
SUT PROLENE 3 0 SH DA (SUTURE) IMPLANT
SUT PROLENE 4 0 RB 1 (SUTURE)
SUT PROLENE 4-0 RB1 .5 CRCL 36 (SUTURE) IMPLANT
SUT SILK  1 MH (SUTURE) ×6
SUT SILK 1 MH (SUTURE) ×9 IMPLANT
SUT SILK 1 TIES 10X30 (SUTURE) IMPLANT
SUT SILK 2 0SH CR/8 30 (SUTURE) IMPLANT
SUT SILK 3 0SH CR/8 30 (SUTURE) IMPLANT
SUT VIC AB 1 CTX 18 (SUTURE) IMPLANT
SUT VIC AB 1 CTX 36 (SUTURE)
SUT VIC AB 1 CTX36XBRD ANBCTR (SUTURE) IMPLANT
SUT VIC AB 2-0 CT1 27 (SUTURE) ×2
SUT VIC AB 2-0 CT1 TAPERPNT 27 (SUTURE) ×3 IMPLANT
SUT VIC AB 2-0 CTX 36 (SUTURE) IMPLANT
SUT VIC AB 3-0 SH 27 (SUTURE) ×4
SUT VIC AB 3-0 SH 27X BRD (SUTURE) ×6 IMPLANT
SUT VIC AB 3-0 X1 27 (SUTURE) IMPLANT
SUT VICRYL 0 UR6 27IN ABS (SUTURE) ×5 IMPLANT
SUT VICRYL 2 TP 1 (SUTURE) IMPLANT
SWAB COLLECTION DEVICE MRSA (MISCELLANEOUS) IMPLANT
SWAB CULTURE ESWAB REG 1ML (MISCELLANEOUS) IMPLANT
SYR 10ML LL (SYRINGE) ×5 IMPLANT
SYR 20ML LL LF (SYRINGE) ×5 IMPLANT
SYR 50ML LL SCALE MARK (SYRINGE) ×5 IMPLANT
SYSTEM SAHARA CHEST DRAIN ATS (WOUND CARE) ×5 IMPLANT
TAPE CLOTH 4X10 WHT NS (GAUZE/BANDAGES/DRESSINGS) ×5 IMPLANT
TAPE CLOTH SURG 4X10 WHT LF (GAUZE/BANDAGES/DRESSINGS) ×5 IMPLANT
TAPE UMBILICAL COTTON 1/8X30 (MISCELLANEOUS) IMPLANT
TIP APPLICATOR SPRAY EXTEND 16 (VASCULAR PRODUCTS) IMPLANT
TOWEL GREEN STERILE (TOWEL DISPOSABLE) ×5 IMPLANT
TOWEL GREEN STERILE FF (TOWEL DISPOSABLE) ×5 IMPLANT
TRAP SPECIMEN MUCUS 40CC (MISCELLANEOUS) IMPLANT
TRAY FOLEY SLVR 16FR LF STAT (SET/KITS/TRAYS/PACK) IMPLANT
TROCAR XCEL 12X100 BLDLESS (ENDOMECHANICALS) IMPLANT
TROCAR XCEL BLADELESS 5X75MML (TROCAR) ×5 IMPLANT
TUBE CONNECTING 20'X1/4 (TUBING) ×1
TUBE CONNECTING 20X1/4 (TUBING) ×4 IMPLANT
TUBE ENDOVIVE SAFETY PEG 22FR (TUBING) ×5 IMPLANT
TUBING ENDO SMARTCAP (MISCELLANEOUS) ×5 IMPLANT
TUBING EXTENTION W/L.L. (IV SETS) ×5 IMPLANT
TUNNELER SHEATH ON-Q 11GX8 DSP (PAIN MANAGEMENT) IMPLANT
WATER STERILE IRR 1000ML POUR (IV SOLUTION) ×5 IMPLANT

## 2020-05-17 NOTE — Transfer of Care (Signed)
Immediate Anesthesia Transfer of Care Note  Patient: Evan Chavez  Procedure(s) Performed: RIGHT VIDEO ASSISTED THORACOSCOPY (VATS)/DECORTICATION (Right Chest) ESOPHAGEAL STENT PLACEMENT AND  PEG TUBE PLACEMENT (N/A Esophagus) ESOPHAGOGASTRODUODENOSCOPY (EGD) (N/A Esophagus)  Patient Location: PACU  Anesthesia Type:General  Level of Consciousness: awake and patient cooperative  Airway & Oxygen Therapy: Patient Spontanous Breathing and non-rebreather face mask  Post-op Assessment: Report given to RN  Post vital signs: Reviewed and unstable  Last Vitals:  Vitals Value Taken Time  BP 157/89 05/17/20 1856  Temp    Pulse 125 05/17/20 1906  Resp 27 05/17/20 1906  SpO2 91 % 05/17/20 1906  Vitals shown include unvalidated device data.  Last Pain:  Vitals:   05/17/20 1200  TempSrc:   PainSc: 0-No pain      Patients Stated Pain Goal: 0 (35/36/14 4315)  Complications: No complications documented.

## 2020-05-17 NOTE — Progress Notes (Signed)
[place] Progress Note Patient Name: Evan Chavez DOB: 10/30/1960 MRN: [id number]   Date of Service  05/17/2020  HPI/Events of Note  Request for fentanyl for belly pain as order expired  eICU Interventions  Ordered x 1      Intervention Category Intermediate Interventions: Pain - evaluation and management  Margaretmary Lombard 05/17/2020, 6:10 AM

## 2020-05-17 NOTE — Consult Note (Signed)
[address]       [place] [address]             [phone]                    Alyus Orlando Weir Harris Medical Record #[id number] Date of Birth: 1961-04-18  Referring: No ref. provider found Primary Care: Rosita Fire, MD Primary Cardiologist: No primary care provider on file.  Chief Complaint:    Chief Complaint  Patient presents with  . Shortness of Breath    History of Present Illness:    Evan Chavez 59 y.o. male is transferred from [facility] progressively enlarging right sided hydropneumothorax.  Cross-sectional imaging also shows a defect in his mid to distal esophagus concerning for perforation.  He is currently complaining of some dysphagia and is short of breath.  Patient states that he has had a long history of reflux and some dysphagia.  He was originally admitted on 05/11/2020 with shortness of breath and was originally treated as a pneumonia with a parapneumonic effusion.  He has been followed by gastroenterology and over the last 2 years is undergone multiple upper endoscopies.  In 2019 he had a biopsy which did show Barrett's esophagus.  In 2020 he underwent a balloon stricturoplasty for his GE junction.  A moderate sized hiatal hernia is also noted on all endoscopies.  Cross-sectional imaging prior to this does show an abnormal appearing soft tissue noted in the posterior mediastinum in the region of the mid and distal esophagus below the carina to the hiatus.    Past Medical History:  Diagnosis Date  . Cocaine abuse ([place])   . ETOH abuse   . GERD (gastroesophageal reflux disease)   . Hyperlipidemia   . Stroke (cerebrum) ([place]) 06/2018   right sided weakness    Past Surgical History:  Procedure Laterality Date  . BIOPSY  08/25/2018   Procedure: BIOPSY;  Surgeon: [name], MD;  [facility];  Service: Endoscopy;;  . BIOPSY  12/13/2018   Procedure: BIOPSY;  Surgeon: [name], MD;  [facility];  Service: Endoscopy;;  esophagus  . COLONOSCOPY WITH PROPOFOL N/A 04/07/2019   Dr. [name]: 3 Tubular adenomas removed (4 to 8 mm in size), next colonoscopy 3 years.  . ESOPHAGOGASTRODUODENOSCOPY (EGD) WITH PROPOFOL N/A 08/25/2018   Dr. [name]: Severely inflamed abnormal appearing mid/distal esophagus.  Encroachment on lumen suspicious for infiltrating neoplasm located be all from severe benign inflammation, extensive gastric erosions and extensive duodenal ulcerations.  Query ischemic process versus other.  Biopsy revealed Barrett's esophagus but negative for malignancy and H. pylori.  . ESOPHAGOGASTRODUODENOSCOPY (EGD) WITH PROPOFOL N/A 12/13/2018   Dr. [name]: peptic stricture s/p balloon dilatation. abnormal esophageal mucosa, by c/w barrett's without dysplasia, previous PUD completely healed.   Marland Kitchen POLYPECTOMY  04/07/2019   Procedure: POLYPECTOMY;  Surgeon: [name], MD;  [facility];  Service: Endoscopy;;  colon    Family History  Problem Relation Age of Onset  . Hypertension Father   . Heart attack Father   . Heart disease Father   . Hypertension Mother   . GER disease Mother   . Thyroid disease Mother   . Colon cancer Neg Hx   . Colon polyps Neg Hx      Social History   Tobacco Use  Smoking Status Former Smoker  . Packs/day: 1.00  . Years: 40.00  .  Pack years: 40.00  . Types: Cigarettes  . Quit date: 06/2018  . Years since quitting: 1.9  Smokeless Tobacco Never Used    Social History   Substance and Sexual Activity  Alcohol Use Not Currently  . Alcohol/week: 12.0 standard drinks  . Types: 12 Cans of beer per week   Comment: none since 06/2018     No Known Allergies  Current Facility-Administered Medications  Medication Dose Route Frequency Provider Last Rate Last Admin  . 0.9 %  sodium chloride infusion   Intravenous PRN Orson Eva, MD 70 mL/hr at 05/16/20 2122 Rate Change at 05/16/20 2122  . 0.9 %  sodium chloride infusion    Intravenous Continuous Johnson, Clanford L, MD 70 mL/hr at 05/17/20 0600 Rate Verify at 05/17/20 0600  . acetaminophen (TYLENOL) tablet 650 mg  650 mg Oral Q6H PRN Tat, Shanon Brow, MD   650 mg at 05/14/20 0943   Or  . acetaminophen (TYLENOL) suppository 650 mg  650 mg Rectal Q6H PRN Tat, Shanon Brow, MD      . chlorhexidine (PERIDEX) 0.12 % solution 15 mL  15 mL Mouth Rinse BID Margaretha Seeds, MD   15 mL at 05/16/20 2341  . Chlorhexidine Gluconate Cloth 2 % PADS 6 each  6 each Topical Daily Tat, David, MD   6 each at 05/16/20 2200  . fluconazole (DIFLUCAN) IVPB 400 mg  400 mg Intravenous Q24H Anaka Beazer, Lucile Crater, MD      . heparin injection 5,000 Units  5,000 Units Subcutaneous Franco Collet, MD   5,000 Units at 05/17/20 0550  . ipratropium-albuterol (DUONEB) 0.5-2.5 (3) MG/3ML nebulizer solution 3 mL  3 mL Nebulization Q6H Orson Eva, MD   3 mL at 05/17/20 0719  . MEDLINE mouth rinse  15 mL Mouth Rinse q12n4p Margaretha Seeds, MD      . metoprolol tartrate (LOPRESSOR) injection 5 mg  5 mg Intravenous Q6H Johnson, Clanford L, MD   5 mg at 05/17/20 0550  . ondansetron (ZOFRAN) injection 4 mg  4 mg Intravenous Q6H PRN Tat, Shanon Brow, MD   4 mg at 05/13/20 1248  . piperacillin-tazobactam (ZOSYN) IVPB 3.375 g  3.375 g Intravenous Franco Collet, MD 12.5 mL/hr at 05/17/20 0808 3.375 g at 05/17/20 0808  . vancomycin (VANCOREADY) IVPB 750 mg/150 mL  750 mg Intravenous Q8H Karren Cobble, RPH 150 mL/hr at 05/17/20 0600 Rate Verify at 05/17/20 0600    Review of Systems  Constitutional: Positive for fever and malaise/fatigue.  Respiratory: Positive for cough and shortness of breath.   Cardiovascular: Positive for chest pain.  Gastrointestinal: Positive for abdominal pain and heartburn.    PHYSICAL EXAMINATION: BP (!) 148/87   Pulse (!) 108   Temp 99.2 F (37.3 C) (Oral)   Resp (!) 24   Ht 5\' 9"  (1.753 m)   Wt 63.6 kg   SpO2 93%   BMI 20.71 kg/m   Physical Exam Constitutional:      General: He  is not in acute distress.    Appearance: He is well-developed. He is toxic-appearing and diaphoretic.  Cardiovascular:     Rate and Rhythm: Tachycardia present.  Pulmonary:     Effort: Tachypnea present.  Abdominal:     Palpations: Abdomen is soft.  Skin:    General: Skin is warm.  Neurological:     General: No focal deficit present.     Mental Status: He is alert and oriented to person, place, and time.  Diagnostic Studies & Laboratory data:     Recent Radiology Findings:   DG Chest 1 View  Result Date: 05/14/2020 CLINICAL DATA:  Complicated RIGHT pleural effusion post diagnostic thoracentesis EXAM: CHEST  1 VIEW COMPARISON:  05/10/2020 FINDINGS: Enlargement of cardiac silhouette. Mediastinal contours and pulmonary vascularity normal. Persistent RIGHT pleural effusion and significant atelectasis and/or consolidation of the mid to lower RIGHT lung. New lucency at the RIGHT apex, suspect representing apical pneumothorax. No mediastinal shift. Patient asymptomatic. Persistent infiltrate LEFT lower lobe and tiny LEFT pleural effusion. IMPRESSION: Probable RIGHT apical pneumothorax post diagnostic thoracentesis. Persistent complicated partially loculated RIGHT pleural effusion, RIGHT basilar atelectasis versus consolidation, and LEFT lower lobe consolidation. Patient asymptomatic. Findings discussed with Dr. Elsworth Soho on 05/14/2020 at 1545 hours. Electronically Signed   By: Lavonia Dana M.D.   On: 05/14/2020 15:51   CT CHEST WO CONTRAST  Result Date: 05/16/2020 CLINICAL DATA:  Pleural effusion. EXAM: CT CHEST WITHOUT CONTRAST TECHNIQUE: Multidetector CT imaging of the chest was performed following the standard protocol without IV contrast. COMPARISON:  CT dated May 10, 2020 FINDINGS: Cardiovascular: The heart size is stable. There is some shift of the mediastinum to the left. There is a trace pericardial effusion. Mild atherosclerotic changes are noted of the thoracic aorta. Mediastinum/Nodes:  --mild mediastinal adenopathy is noted -- there is mild bilateral hilar adenopathy. -- No axillary lymphadenopathy. -- No supraclavicular lymphadenopathy. --there is a left-sided thyroid nodule measuring approximately 2.4 cm. -the esophagus is diffusely thickened. There appears to be a defect in the right lateral aspect of the mid to distal esophagus (axial series 3, image [id number]). Adjacent to this defect is extraluminal oral contrast tracking into the right pleural space. Lungs/Pleura: There is been interval placement of a right-sided chest tube. There is a large right-sided hydropneumothorax which has increased in size since the patient's prior study despite placement of a right-sided chest tube. Additionally. The pleural effusion component appears more complex than prior study. There is new extensive debris within bronchus intermedius as well as the right lower lobe and right middle lobe bronchi consistent with significant interval aspiration. The right middle lobe and right lower lobe are essentially collapsed. There are hypoattenuating components within the right middle and right lower lobes concerning for developing aspiration pneumonia. There are new areas of consolidation involving the left lower and left upper lobes concerning for aspiration. There is a small to moderate-sized left-sided pleural effusion. The chest tube is kinked at its distal most aspect. Upper Abdomen: There is no definite acute abnormality in the upper abdomen. Musculoskeletal: No chest wall abnormality. No bony spinal canal stenosis. IMPRESSION: 1. Overall findings consistent with esophageal perforation arising from the right lateral aspect of the mid to distal esophagus as detailed above. There is extraluminal oral contrast tracking from this esophageal wall defect into the right pleural space. 2. Large right-sided hydropneumothorax with significant interval worsening since the prior study despite right-sided chest tube placement.  Additionally, the fluid component appears more complex. The right-sided chest tube appears to enter the thoracic cavity inferiorly and courses along the right hemidiaphragm and terminates in the region of the right epicardial fat pad. The tube is kinked at its tip. While the tube is likely intrathoracic, it will need significant repositioning to be effective. 3. Findings consistent with interval aspiration with complete occlusion of bronchus intermedius as well as the right middle and right lower lobe bronchi. There is likely developing aspiration pneumonia within the collapsed right middle and right lower lobes as well  as the posterior left upper and left lower lobes. There is a small left-sided pleural effusion. 4. Persistent diffuse esophageal wall thickening of unknown clinical significance. Correlation with endoscopy is recommended as an underlying mass is not excluded. 5. Again noted is a left-sided thyroid nodule measuring approximately 2.4 cm. Outpatient thyroid ultrasound follow-up is recommended.(Ref: J Am Coll Radiol. 2015 Feb;12(2): 143-50). These results were called by telephone at the time of interpretation on 05/16/2020 at 6:04 pm to provider [facility] ALVA , who verbally acknowledged these results. Electronically Signed   By: Constance Holster M.D.   On: 05/16/2020 18:04   CT Angio Chest PE W and/or Wo Contrast  Result Date: 05/11/2020 CLINICAL DATA:  Shortness of breath and weakness for several days EXAM: CT ANGIOGRAPHY CHEST WITH CONTRAST TECHNIQUE: Multidetector CT imaging of the chest was performed using the standard protocol during bolus administration of intravenous contrast. Multiplanar CT image reconstructions and MIPs were obtained to evaluate the vascular anatomy. CONTRAST:  60mL OMNIPAQUE IOHEXOL 350 MG/ML SOLN COMPARISON:  Chest x-ray from earlier in the same day, CTA from 07/12/2018. FINDINGS: Cardiovascular: Thoracic aorta demonstrates atherosclerotic calcifications. No aneurysmal  dilatation or dissection is noted. No cardiac enlargement is seen. Coronary calcifications are noted. The pulmonary artery shows a normal branching pattern. No definitive filling defect is identified to suggest pulmonary embolism. Mediastinum/Nodes: Thoracic inlet shows a hypodensity within the left lobe of the thyroid measuring 17 mm in greatest dimension. This is relatively stable from a prior CT examination from 2019. No sizable hilar or mediastinal adenopathy is noted. The esophagus demonstrates some diffuse wall thickening which may be related to reflux esophagitis. Lungs/Pleura: Bilateral moderate pleural effusions are seen. Associated lower lobe consolidation is noted. No sizable parenchymal nodules are seen. No pneumothorax is noted. Upper Abdomen: Visualized upper abdomen shows geographic decreased attenuation within the left kidney and enlargement of the left kidney. This may simply be related to hydronephrosis. The possibility of an infiltrating mass deserves consideration. This can be further evaluated with CT of the abdomen and pelvis with contrast. Musculoskeletal: No acute bony abnormality is noted. Review of the MIP images confirms the above findings. IMPRESSION: No evidence of pulmonary emboli. Bilateral pleural effusions and lower lobe consolidation similar to that seen on recent chest x-ray. This is consistent with bibasilar pneumonia and reactive effusions. Diffuse wall thickening throughout the esophagus likely related to reflux esophagitis. Clinical correlation is recommended. It should be noted this is stable from a prior exam of 2019. Enlargement of the left kidney with decreased attenuation identified. Although this may simply represent hydronephrosis possibility of an underlying infiltrative mass deserves consideration. CT of the abdomen and pelvis with contrast is recommended. 17 mm left thyroid nodule. This is roughly stable from the prior CT examination from 2019 and likely benign.  Recommend thyroid US (ref: J Am Coll Radiol. 2015 Feb;12(2): 143-50). Electronically Signed   By: Inez Catalina M.D.   On: 05/11/2020 00:00   CT ABDOMEN PELVIS W CONTRAST  Result Date: 05/13/2020 CLINICAL DATA:  LEFT renal mass. Suspicion for renal mass on prior chest CT. EXAM: CT ABDOMEN AND PELVIS WITH CONTRAST TECHNIQUE: Multidetector CT imaging of the abdomen and pelvis was performed using the standard protocol following bolus administration of intravenous contrast. CONTRAST:  167mL OMNIPAQUE IOHEXOL 300 MG/ML  SOLN COMPARISON:  Chest CT of May 10, 2020 FINDINGS: Lower chest: Loculated bilateral basilar effusions with similar appearance when compared to the study of May 10, 2020. Basilar airspace disease with increasing airspace disease  particularly at the LEFT lung base. Hiatal hernia and patulous distal esophagus. Calcified coronary artery disease partially imaged. No pericardial effusion. Hepatobiliary: Liver without focal, suspicious lesion. Portal vein is patent. No pericholecystic stranding. No biliary duct dilation. Pancreas: Pancreas is normal without peripancreatic stranding, ductal dilation or inflammation. Spleen: Spleen normal in size and contour. Adrenals/Urinary Tract: Adrenal glands are normal. Moderate to marked LEFT-sided hydroureteronephrosis with overlying cortical thinning. Large calculus in the proximal LEFT ureter measuring 9 x 8 x 8 mm. No perinephric abscess. Urinary bladder is normal. Normal enhancement of the RIGHT kidney. Enhancement of the kidneys despite scarring and dilation is relatively symmetric and there is an additional small calculus to moderate sized calculus in the lower pole the LEFT kidney. Area with limited assessment due to motion artifact. Stomach/Bowel: Hiatal hernia and some distal esophageal thickening as described. Small duodenal diverticulum in the second portion of the duodenum. No acute gastrointestinal process. Appendix not visualized. Motion artifact in  this area limiting assessment but no gross findings of inflammation that would indicate pericecal stranding or secondary signs of acute appendicitis. Colonic interposition along the anterior portion of the liver. No pericolonic stranding. No perirectal stranding. Vascular/Lymphatic: Calcific and noncalcific atheromatous plaque of the abdominal aorta. No aneurysmal dilation. No adenopathy in the retroperitoneum or upper abdomen. No pelvic lymphadenopathy. Reproductive: Heterogeneous prostate. Calcifications of the vas deferens. Other: No ascites. Musculoskeletal: No acute or destructive bone finding. Spinal degenerative changes. IMPRESSION: 1. Moderate to marked hydronephrosis due to calculus in the proximal to mid LEFT ureter. Urologic consultation and correlation with urine studies to exclude concomitant infection is suggested. 2. Cortical thinning that is seen on the LEFT and preservation of renal enhancement suggests that an element of this process could potentially be chronic. 3. Loculated effusions and worsening of LEFT lower lobe airspace disease that may represent a combination of volume loss and pneumonitis 4. Loculated bilateral basilar effusions with similar appearance when compared to the study of May 10, 2020. Basilar airspace disease with increasing airspace disease particularly at the LEFT lung base. Findings may represent worsening pneumonia or aspiration. 5. Hiatal hernia and patulous distal esophagus. 6. Aortic atherosclerosis. Aortic Atherosclerosis (ICD10-I70.0). Electronically Signed   By: Zetta Bills M.D.   On: 05/13/2020 12:52   DG CHEST PORT 1 VIEW  Result Date: 05/16/2020 CLINICAL DATA:  Chest tube insertion for pneumothorax. EXAM: PORTABLE CHEST 1 VIEW COMPARISON:  May 16, 2020 FINDINGS: A right-sided chest tube is noted. Its distal end is kinked and is seen overlying the lateral aspect of the right upper quadrant. A large, stable right-sided hydropneumothorax is seen with  subsequent collapse of the right lung. This is unchanged in appearance when compared to the prior study. Stable mild to moderate severity atelectasis and/or infiltrate is noted within the left lung base. The heart size and mediastinal contours are within normal limits. The visualized skeletal structures are unremarkable. IMPRESSION: 1. Stable large right-sided hydropneumothorax with subsequent collapse of the right lung. 2. Stable mild to moderate severity left basilar atelectasis and/or infiltrate. 3. Right-sided chest tube with its distal end kinked and seen overlying the right upper quadrant. Electronically Signed   By: Virgina Norfolk M.D.   On: 05/16/2020 16:52   DG CHEST PORT 1 VIEW  Result Date: 05/16/2020 CLINICAL DATA:  Pneumothorax post recent thoracentesis, shortness of breath EXAM: PORTABLE CHEST 1 VIEW COMPARISON:  Portable exam 0849 hours compared to 05/14/2020 FINDINGS: Stable heart size and mediastinal contours. RIGHT hydropneumothorax, with increased collapse of RIGHT lung  since previous exam. Minimal LEFT pleural effusion with atelectasis versus infiltrate LEFT lower lobe. LEFT upper lung clear. Bones demineralized. IMPRESSION: RIGHT hydropneumothorax with increased collapse of RIGHT lung since previous exam. Minimal LEFT pleural effusion with atelectasis versus infiltrate at LEFT lower lobe. Findings called to Dr. Elsworth Soho on 05/16/2020 at 0922 hours. Electronically Signed   By: Lavonia Dana M.D.   On: 05/16/2020 09:38   DG Chest Port 1 View  Result Date: 05/10/2020 CLINICAL DATA:  Questionable sepsis.  Pneumonia diagnosis yesterday. EXAM: PORTABLE CHEST 1 VIEW COMPARISON:  Radiograph yesterday. FINDINGS: Upper normal heart size. Unchanged mediastinal contours. Hazy symmetric bibasilar opacities are unchanged from radiograph yesterday. No pulmonary edema. No pneumothorax. IMPRESSION: Unchanged symmetric bibasilar opacities are unchanged from yesterday, likely combination of pleural effusions  and atelectasis/pneumonia. Electronically Signed   By: Keith Rake M.D.   On: 05/10/2020 21:24   DG Chest Port 1 View  Result Date: 05/09/2020 CLINICAL DATA:  Cough and fever. EXAM: PORTABLE CHEST 1 VIEW COMPARISON:  09/06/2018 FINDINGS: Heart size is normal. There are bilateral pleural effusions and infiltrate/atelectasis in both lower lungs, consistent with pneumonia. Upper lungs are clear. IMPRESSION: Bilateral lower lobe atelectasis and pneumonia.  Small effusions. Electronically Signed   By: Nelson Chimes M.D.   On: 05/09/2020 14:16   DG Chest Port 1V same Day  Result Date: 05/14/2020 CLINICAL DATA:  Follow-up right-sided pneumothorax. EXAM: PORTABLE CHEST 1 VIEW COMPARISON:  May 14, 2020 (3:02 p.m.) FINDINGS: Stable moderate to marked severity atelectasis and/or infiltrate is noted within the right lung base with stable mild to moderate severity left basilar atelectasis and/or infiltrate seen. There is a small, stable left pleural effusion with a stable moderate sized right pleural effusion. A stable moderate sized right apical pneumothorax is seen. A mild amount of radiopaque contrast is again seen along the infrahilar region on the right. The heart size and mediastinal contours are within normal limits. The visualized skeletal structures are unremarkable. IMPRESSION: No significant interval change when compared to the prior study dated May 14, 2020 (3:02 p.m.) Electronically Signed   By: Virgina Norfolk M.D.   On: 05/14/2020 19:27   DG Swallowing Func-Speech Pathology  Result Date: 05/14/2020 Objective Swallowing Evaluation: Type of Study: MBS-Modified Barium Swallow Study  Patient Details Name: Evan Chavez MRN: [id number] Date of Birth: 31-Mar-1961 Today's Date: 05/14/2020 Time: SLP Start Time (ACUTE ONLY): 45 -SLP Stop Time (ACUTE ONLY): 1300 SLP Time Calculation (min) (ACUTE ONLY): 30 min Past Medical History: Past Medical History: Diagnosis Date . Cocaine abuse ([place])  . ETOH  abuse  . GERD (gastroesophageal reflux disease)  . Hyperlipidemia  . Stroke (cerebrum) ([place]) 06/2018  right sided weakness Past Surgical History: Past Surgical History: Procedure Laterality Date . BIOPSY  08/25/2018  Procedure: BIOPSY;  Surgeon: [name], MD;  [facility];  Service: Endoscopy;; . BIOPSY  12/13/2018  Procedure: BIOPSY;  Surgeon: [name], MD;  [facility];  Service: Endoscopy;;  esophagus . COLONOSCOPY WITH PROPOFOL N/A 04/07/2019  Dr. [name]: 3 Tubular adenomas removed (4 to 8 mm in size), next colonoscopy 3 years. . ESOPHAGOGASTRODUODENOSCOPY (EGD) WITH PROPOFOL N/A 08/25/2018  Dr. [name]: Severely inflamed abnormal appearing mid/distal esophagus.  Encroachment on lumen suspicious for infiltrating neoplasm located be all from severe benign inflammation, extensive gastric erosions and extensive duodenal ulcerations.  Query ischemic process versus other.  Biopsy revealed Barrett's esophagus but negative for malignancy and H. pylori. . ESOPHAGOGASTRODUODENOSCOPY (EGD) WITH PROPOFOL N/A 12/13/2018  Dr. [name]: peptic stricture s/p balloon dilatation. abnormal esophageal mucosa, by c/w barrett's without dysplasia, previous PUD completely healed.  Marland Kitchen POLYPECTOMY  04/07/2019  Procedure: POLYPECTOMY;  Surgeon: [name], MD;  [facility];  Service: Endoscopy;;  colon HPI: 59 y.o. male with medical history significant for prior stroke, hyperlipidemia, GERD, BPH, barrett's esophagus    and remote history of cocaine abuse who presents to the emergency department due to 5 days of weakness, complains of productive cough with clear phlegm with occasional black speckles with associated reproducible midsternal chest pain. Patient was seen in the ED 2 days ago (7/28) due to similar symptoms, during which he reported a max temperature of 100F.  Chest x-ray done at that time showed pneumonia and patient was treated with IV fluid.  Amoxicillin and doxycycline were  prescribed on the was advised to follow-up with his primary care physician.  Patient returned to the ED yesterday due to increased heart rate and sob.  He states that he was taking his blood pressure medication as prescribed and denies cocaine use. Bilateral pleural effusions and lower lobe consolidation similar to that seen on recent chest x-ray. This is consistent with bibasilar pneumonia and reactive effusions. Diffuse wall thickening throughout the esophagus likely related to reflux esophagitis. Clinical correlation is recommended. It should be noted this is stable from a prior exam of 2019.  Subjective: "My chest hurts when I am coughing sometimes." Assessment / Plan / Recommendation CHL IP CLINICAL IMPRESSIONS 05/14/2020 Clinical Impression Pt presents with mild oropharyngeal phase dysphagia characterized by lingual pumping with reduced bolus cohesiveness and premature spillage of liquids to the valleculae. Hyolaryngeal excursion appeared WNL. Pt without penetration/aspiration across consistencies/textures and only with trace amounts of thin liquid residuals along base of tongue and in lateral channels which cleared with secondary swallow. Pt is currently on 4L O2 via nasal cannula. Risk for aspiration with po intake appears minimal, however his respiratory status slightly increases his risk. Pt also with history of esophageal dysphagia. Recommend regular textures and thin liquids with standard aspiration and reflux precautions. No further SLP services indicated at this time. This study was reviewed with Dr. Elsworth Soho (pulmonary).  SLP Visit Diagnosis Dysphagia, oropharyngeal phase (R13.12) Attention and concentration deficit following -- Frontal lobe and executive function deficit following -- Impact on safety and function Mild aspiration risk   CHL IP TREATMENT RECOMMENDATION 05/14/2020 Treatment Recommendations No treatment recommended at this time   Prognosis 05/14/2020 Prognosis for Safe Diet Advancement Good  Barriers to Reach Goals -- Barriers/Prognosis Comment -- CHL IP DIET RECOMMENDATION 05/14/2020 SLP Diet Recommendations Regular solids;Thin liquid Liquid Administration via Cup;Straw Medication Administration Whole meds with liquid Compensations Slow rate;Small sips/bites Postural Changes Seated upright at 90 degrees;Remain semi-upright after after feeds/meals (Comment)   CHL IP OTHER RECOMMENDATIONS 05/14/2020 Recommended Consults -- Oral Care Recommendations Oral care BID Other Recommendations Clarify dietary restrictions   CHL IP FOLLOW UP RECOMMENDATIONS 05/14/2020 Follow up Recommendations None   CHL IP FREQUENCY AND DURATION 05/14/2020 Speech Therapy Frequency (ACUTE ONLY) min 2x/week Treatment Duration 1 week      CHL IP ORAL PHASE 05/14/2020 Oral Phase Impaired Oral - Pudding Teaspoon -- Oral - Pudding Cup -- Oral - Honey Teaspoon -- Oral - Honey Cup -- Oral - Nectar Teaspoon -- Oral - Nectar Cup -- Oral - Nectar Straw -- Oral - Thin Teaspoon Premature spillage Oral - Thin Cup Premature spillage;Decreased bolus cohesion Oral - Thin Straw Premature spillage Oral - Puree Lingual pumping Oral -  Mech Soft -- Oral - Regular Delayed oral transit Oral - Multi-Consistency -- Oral - Pill -- Oral Phase - Comment --  CHL IP PHARYNGEAL PHASE 05/14/2020 Pharyngeal Phase Impaired Pharyngeal- Pudding Teaspoon -- Pharyngeal -- Pharyngeal- Pudding Cup -- Pharyngeal -- Pharyngeal- Honey Teaspoon -- Pharyngeal -- Pharyngeal- Honey Cup -- Pharyngeal -- Pharyngeal- Nectar Teaspoon -- Pharyngeal -- Pharyngeal- Nectar Cup -- Pharyngeal -- Pharyngeal- Nectar Straw -- Pharyngeal -- Pharyngeal- Thin Teaspoon Delayed swallow initiation-vallecula Pharyngeal -- Pharyngeal- Thin Cup Delayed swallow initiation-vallecula;Lateral channel residue Pharyngeal -- Pharyngeal- Thin Straw Lateral channel residue;Delayed swallow initiation-vallecula Pharyngeal -- Pharyngeal- Puree Delayed swallow initiation-vallecula;WFL Pharyngeal -- Pharyngeal- Mechanical  Soft -- Pharyngeal -- Pharyngeal- Regular WFL Pharyngeal -- Pharyngeal- Multi-consistency -- Pharyngeal -- Pharyngeal- Pill WFL Pharyngeal -- Pharyngeal Comment --  CHL IP CERVICAL ESOPHAGEAL PHASE 05/14/2020 Cervical Esophageal Phase WFL Pudding Teaspoon -- Pudding Cup -- Honey Teaspoon -- Honey Cup -- Nectar Teaspoon -- Nectar Cup -- Nectar Straw -- Thin Teaspoon -- Thin Cup -- Thin Straw -- Puree -- Mechanical Soft -- Regular -- Multi-consistency -- Pill -- Cervical Esophageal Comment -- Thank you, Genene Churn, [place] PORTER,DABNEY 05/14/2020, 2:21 PM              ECHOCARDIOGRAM COMPLETE  Result Date: 05/11/2020    ECHOCARDIOGRAM REPORT   Patient Name:   Evan Chavez Date of Exam: 05/11/2020 Medical Rec #:  [id number]           Height:       69.0 in Accession #:    [id number]          Weight:       153.0 lb Date of Birth:  [birth year]           BSA:          1.844 m Patient Age:    57 years            BP:           141/90 mmHg Patient Gender: M                   HR:           100 bpm. Exam Location:  [facility] Procedure: 2D Echo, Cardiac Doppler and Color Doppler Indications:    CHF  History:        Patient has prior history of Echocardiogram examinations, most                 recent 07/13/2018. CHF, Stroke, Signs/Symptoms:Shortness of                 Breath; Risk Factors:Hypertension and Dyslipidemia. Cocaine                 abuse, Pneumonia.  Sonographer:    Dustin Flock RDCS Referring Phys: [id number] [place]  1. Left ventricular ejection fraction, by estimation, is 65 to 70%. The left ventricle has normal function. The left ventricle has no regional wall motion abnormalities. Left ventricular diastolic parameters are consistent with Grade I diastolic dysfunction (impaired relaxation).  2. Right ventricular systolic function is normal. The right ventricular size is normal. Tricuspid regurgitation signal is inadequate for assessing PA pressure.  3. The mitral valve is  grossly normal. Trivial mitral valve regurgitation.  4. The aortic valve is tricuspid. Aortic valve regurgitation is not visualized.  5. The inferior vena cava is normal in size with greater than 50% respiratory variability, suggesting right atrial pressure of 3 mmHg. FINDINGS  Left Ventricle: Left ventricular ejection fraction, by estimation, is 65 to 70%. The left ventricle has normal function. The left ventricle has no regional wall motion abnormalities. The left ventricular internal cavity size was normal in size. There is  no left ventricular hypertrophy. Left ventricular diastolic parameters are consistent with Grade I diastolic dysfunction (impaired relaxation). Right Ventricle: The right ventricular size is normal. No increase in right ventricular wall thickness. Right ventricular systolic function is normal. Tricuspid regurgitation signal is inadequate for assessing PA pressure. Left Atrium: Left atrial size was normal in size. Right Atrium: Right atrial size was normal in size. Pericardium: There is no evidence of pericardial effusion. Mitral Valve: The mitral valve is grossly normal. Trivial mitral valve regurgitation. Tricuspid Valve: The tricuspid valve is grossly normal. Tricuspid valve regurgitation is mild. Aortic Valve: The aortic valve is tricuspid. Aortic valve regurgitation is not visualized. Pulmonic Valve: The pulmonic valve was grossly normal. Pulmonic valve regurgitation is trivial. Aorta: The aortic root is normal in size and structure. Venous: The inferior vena cava is normal in size with greater than 50% respiratory variability, suggesting right atrial pressure of 3 mmHg. IAS/Shunts: No atrial level shunt detected by color flow Doppler.  LEFT VENTRICLE PLAX 2D LVIDd:         4.30 cm  Diastology LVIDs:         2.68 cm  LV e' lateral:   6.42 cm/s LV PW:         0.82 cm  LV E/e' lateral: 11.1 LV IVS:        0.98 cm  LV e' medial:    5.11 cm/s LVOT diam:     2.20 cm  LV E/e' medial:  13.9 LV  SV:         81 LV SV Index:   44 LVOT Area:     3.80 cm  RIGHT VENTRICLE RV Basal diam:  2.99 cm RV S prime:     21.60 cm/s TAPSE (M-mode): 3.1 cm LEFT ATRIUM             Index       RIGHT ATRIUM           Index LA diam:        3.50 cm 1.90 cm/m  RA Area:     14.20 cm LA Vol (A2C):   42.8 ml 23.21 ml/m RA Volume:   31.20 ml  16.92 ml/m LA Vol (A4C):   46.7 ml 25.33 ml/m LA Biplane Vol: 45.6 ml 24.73 ml/m  AORTIC VALVE LVOT Vmax:   124.00 cm/s LVOT Vmean:  72.900 cm/s LVOT VTI:    0.212 m  AORTA Ao Root diam: 3.00 cm MITRAL VALVE MV Area (PHT): 6.65 cm     SHUNTS MV Decel Time: 114 msec     Systemic VTI:  0.21 m MV E velocity: 71.10 cm/s   Systemic Diam: 2.20 cm MV A velocity: 113.00 cm/s MV E/A ratio:  0.63 Rozann Lesches MD Electronically signed by Rozann Lesches MD Signature Date/Time: 05/11/2020/4:35:39 PM    Final    US THORACENTESIS ASP PLEURAL SPACE W/IMG GUIDE  Result Date: 05/14/2020 INDICATION: Complicated RIGHT pleural effusion EXAM: ULTRASOUND GUIDED DIAGNOSTIC RIGHT THORACENTESIS MEDICATIONS: None. COMPLICATIONS: Probable RIGHT pneumothorax, see chest radiograph report PROCEDURE: An ultrasound guided thoracentesis was thoroughly discussed with the patient and questions answered. The benefits, risks, alternatives and complications were also discussed. The patient understands and wishes to proceed with the procedure. Written consent was obtained. Ultrasound was performed to  localize and mark an adequate pocket of fluid in the posterior RIGHT chest. The area was then prepped and draped in the normal sterile fashion. 1% Lidocaine was used for local anesthesia. Under ultrasound guidance a 5 [place] Yueh catheter was introduced. Thoracentesis was performed. The catheter was removed and a dressing applied. FINDINGS: A total of approximately 30 mL of cloudy yellow slightly thicker RIGHT pleural fluid was removed. Samples were sent to the laboratory as requested by the clinical team. IMPRESSION:  Successful ultrasound guided diagnostic RIGHT thoracentesis yielding 30 mL of cloudy yellow RIGHT pleural fluid, cannot exclude infection/empyema with this appearance. Findings discussed with Dr. Elsworth Soho on 05/14/2020 at 1545 hours. Electronically Signed   By: Lavonia Dana M.D.   On: 05/14/2020 15:50       I have independently reviewed the above radiology studies  and reviewed the findings with the patient.   Recent Lab Findings: Lab Results  Component Value Date   WBC 15.7 (H) 05/16/2020   HGB 8.5 (L) 05/16/2020   HCT 27.6 (L) 05/16/2020   PLT 428 (H) 05/16/2020   GLUCOSE 121 (H) 05/16/2020   CHOL 124 07/13/2018   TRIG 82 07/13/2018   HDL 31 (L) 07/13/2018   LDLCALC 77 07/13/2018   ALT 25 05/11/2020   AST 21 05/11/2020   NA 137 05/16/2020   K 3.7 05/16/2020   CL 99 05/16/2020   CREATININE 0.91 05/16/2020   BUN 11 05/16/2020   CO2 25 05/16/2020   TSH 0.889 07/13/2018   INR 1.1 05/11/2020   HGBA1C 5.5 07/12/2018       Assessment / Plan:   59 year old male transferred from [facility] with an esophageal perforation.  He has a long history of peptic strictures that have been dilated on multiple occasions.  It appears as though this may be the culprit for his perforation.  He was originally treated for an aspiration pneumonia and was recently made n.p.o. given this new finding on cross-sectional imaging.  I discussed the risks and benefits of a right thoracoscopy with washout esophageal stent placement and percutaneous gastrostomy tube placement.  He is agreeable to proceed.  He is scheduled for later today.  Have also added Diflucan to his antimicrobial coverage.       Lajuana Matte 05/17/2020 8:27 AM

## 2020-05-17 NOTE — Progress Notes (Signed)
Initial Nutrition Assessment  DOCUMENTATION CODES:   Non-severe (moderate) malnutrition in context of chronic illness  INTERVENTION:   Once PEG cleared for use, recommend: - Start Osmolite 1.5 @ 25 ml/hr and titrate by 10 ml q 4 hours until goal rate of 55 ml/hr is reached (1320 ml/day) - ProSource TF 45 ml BID  Tube feeding regimen at goal would provide 2060 kcal, 105 grams of protein, and 1006 ml of H2O.  Once feeding initiated, monitor magnesium, potassium, and phosphorus daily for at least 3 days, MD to replete as needed, as pt is at risk for refeeding syndrome given malnutrition, poor PO intake x 2 weeks.  NUTRITION DIAGNOSIS:   Moderate Malnutrition related to chronic illness (Barrett's esophagus, GERD) as evidenced by moderate fat depletion, moderate muscle depletion.  GOAL:   Patient will meet greater than or equal to 90% of their needs  MONITOR:   Diet advancement, Labs, Weight trends, Skin, I & O's  REASON FOR ASSESSMENT:   Rounds    ASSESSMENT:   59 year old male who presented on 7/28 with fever, SOB. PMH of stroke, HLD, GERD, Barrett's esophagus, PUD, s/p balloon stricturoplasty for GE junction in 2020. Admitted with severe sepsis secondary to bilateral pleural effusions with concomitant bibasilar pneumonia.   8/02 - MBSS with recommendation for regular diet with thin liquids, s/p thoracentesis 8/04 - s/p chest tube insertion, chest CT showing esophageal perforation  Pt developed right pneumothorax after thoracentesis.  Plan is for right thoracoscopy with washout, esophogeal stent placement, and PEG placement today. RD will leave tube feeding recommendations.  Spoke with pt and mother at bedside. 36 of history obtained from pt's mother. She reports pt's appetite decreased and he began eating poorly about 1 month ago. She reports that during this time she was having pt consume 2 Boost supplements daily. She reports that over the last 2 weeks, pt's PO  intake was very poor. Pt has eaten just a little bit since admission.  Pt's mother reports that pt has lost weight recently but she is not sure how much. She reports pt's UBW as 150-155 lbs. Reviewed weight history in chart. Pt with a 4.6 kg weight loss over the last 5 months. This is a 6.7% weight loss which is not significant for timeframe but is concerning.  Meal Completion: 0-75% x 7 documented meals  Medications reviewed and include: NS @ 70 ml/hr, IV abx  Labs reviewed: hemoglobin 8.5 CBG's: 91-117 x 24 hours  UOP: 925 ml x 24 hours CT: 20 ml x 24 hours I/O's: -1.7 L since admit  NUTRITION - FOCUSED PHYSICAL EXAM:    Most Recent Value  Orbital Region Moderate depletion  Upper Arm Region Moderate depletion  Thoracic and Lumbar Region Moderate depletion  Buccal Region Moderate depletion  Temple Region Moderate depletion  Clavicle Bone Region Moderate depletion  Clavicle and Acromion Bone Region Moderate depletion  Scapular Bone Region Moderate depletion  Dorsal Hand Moderate depletion  Patellar Region Moderate depletion  Anterior Thigh Region Moderate depletion  Posterior Calf Region Moderate depletion  Edema (RD Assessment) None  Hair Reviewed  Eyes Reviewed  Mouth Reviewed  Skin Reviewed  Nails Reviewed       Diet Order:   Diet Order            Diet NPO time specified  Diet effective now                 EDUCATION NEEDS:   Education needs have been addressed  Skin:  Skin Assessment: Reviewed RN Assessment  Last BM:  05/15/20  Height:   Ht Readings from Last 1 Encounters:  05/17/20 5\' 9"  (1.753 m)    Weight:   Wt Readings from Last 1 Encounters:  05/17/20 63.6 kg    Ideal Body Weight:  72.7 kg  BMI:  Body mass index is 20.71 kg/m.  Estimated Nutritional Needs:   Kcal:  2000-2200  Protein:  100-115 grams  Fluid:  >/= 2.0 L    Gaynell Face, MS, RD, LDN Inpatient Clinical Dietitian Please see AMiON for contact  information.

## 2020-05-17 NOTE — Progress Notes (Signed)
Patient sO2 88-90% on 15L non-rebreather.  Dr. Tobias Alexander at bedside and Dr. Kipp Brood notified.  Okay to try Bipap.  Respiratory notified.  Chest x-ray done.  Will continue to monitor.

## 2020-05-17 NOTE — Anesthesia Postprocedure Evaluation (Signed)
Anesthesia Post Note  Patient: Evan Chavez Stay  Procedure(s) Performed: RIGHT VIDEO ASSISTED THORACOSCOPY (VATS)/DECORTICATION (Right Chest) ESOPHAGEAL STENT PLACEMENT AND  PEG TUBE PLACEMENT (N/A Esophagus) ESOPHAGOGASTRODUODENOSCOPY (EGD) (N/A Esophagus)     Patient location during evaluation: PACU Anesthesia Type: General Level of consciousness: sedated Pain management: pain level controlled Vital Signs Assessment: post-procedure vital signs reviewed and stable Respiratory status: spontaneous breathing and respiratory function stable (On Bipap) Cardiovascular status: stable Postop Assessment: no apparent nausea or vomiting Anesthetic complications: no   No complications documented.  Last Vitals:  Vitals:   05/17/20 1941 05/17/20 1955  BP: (!) 161/98 (!) 161/98  Pulse: (!) 117 (!) 120  Resp: 20 (!) 21  Temp:    SpO2: 95% 97%    Last Pain:  Vitals:   05/17/20 1200  TempSrc:   PainSc: 0-No pain                 Nasser Ku DANIEL

## 2020-05-17 NOTE — Anesthesia Preprocedure Evaluation (Signed)
Anesthesia Evaluation  Patient identified by MRN, date of birth, ID band Patient awake    Reviewed: Allergy & Precautions, NPO status , Patient's Chart, lab work & pertinent test results  Airway Mallampati: II  TM Distance: >3 FB     Dental   Pulmonary pneumonia, former smoker,     + decreased breath sounds      Cardiovascular hypertension,  Rhythm:Regular Rate:Normal     Neuro/Psych    GI/Hepatic Neg liver ROS, GERD  ,  Endo/Other    Renal/GU Renal disease     Musculoskeletal   Abdominal   Peds  Hematology  (+) anemia ,   Anesthesia Other Findings   Reproductive/Obstetrics                             Anesthesia Physical Anesthesia Plan  ASA: III  Anesthesia Plan: General   Post-op Pain Management:    Induction: Intravenous  PONV Risk Score and Plan: 3 and Ondansetron, Dexamethasone and Midazolam  Airway Management Planned: Double Lumen EBT  Additional Equipment:   Intra-op Plan:   Post-operative Plan: Possible Post-op intubation/ventilation  Informed Consent: I have reviewed the patients History and Physical, chart, labs and discussed the procedure including the risks, benefits and alternatives for the proposed anesthesia with the patient or authorized representative who has indicated his/her understanding and acceptance.     Dental advisory given  Plan Discussed with: CRNA, Anesthesiologist and Surgeon  Anesthesia Plan Comments:         Anesthesia Quick Evaluation

## 2020-05-17 NOTE — Brief Op Note (Signed)
05/17/2020  6:34 PM  PATIENT:  Evan Chavez  59 y.o. male  PRE-OPERATIVE DIAGNOSIS:  esophageal perforation  POST-OPERATIVE DIAGNOSIS:  Esophageal Perforation  PROCEDURE:  Procedure(s): RIGHT VIDEO ASSISTED THORACOSCOPY (VATS)/DECORTICATION (Right) ESOPHAGEAL STENT PLACEMENT AND  PEG TUBE PLACEMENT (N/A) ESOPHAGOGASTRODUODENOSCOPY (EGD) (N/A)  SURGEON:  Surgeon(s) and Role:    * Lightfoot, Lucile Crater, MD - Primary  PHYSICIAN ASSISTANT: Micahel Omlor PA-C  ANESTHESIA:   general  EBL:  MINIMAL   BLOOD ADMINISTERED:none  DRAINS: 1  Chest Tube(s) in the RIGHT HEMITHORAX, (1 ) Blake drain(s) in the RIGHT HEMITHORAX and PEG TUBE TO STRAIGHT DRAINAGE INTO FOLEY BAG   LOCAL MEDICATIONS USED:  MARCAINE     SPECIMEN:  Source of Specimen:  EMPYEMA, PLEURAL PEEL  DISPOSITION OF SPECIMEN:  PATHOLOGY, MICROBIOLOGY FOR CULTURES  COUNTS:  YES  TOURNIQUET:  * No tourniquets in log *  DICTATION: .Dragon Dictation  PLAN OF CARE: Admit to inpatient   PATIENT DISPOSITION:  PACU - hemodynamically stable.   Delay start of Pharmacological VTE agent (>24hrs) due to surgical blood loss or risk of bleeding: yes  COMPLICATIONS: NO KNOWN

## 2020-05-17 NOTE — Progress Notes (Signed)
[facility] Progress Note Patient Name: Evan Chavez DOB: January 01, 1961 MRN: [id number]   Date of Service  05/17/2020  HPI/Events of Note  Pt is s/p esophageal stent placement, goal BP is < 160 mmHg, SBP 180 + and patient does not have prn iv anti-hypertensive orders.  eICU Interventions  PRN Hydralazine ordered for SBP > 160 mmHg per goals.        Kerry Kass Kartik Fernando 05/17/2020, 8:58 PM

## 2020-05-17 NOTE — Anesthesia Procedure Notes (Addendum)
Procedure Name: Intubation Date/Time: 05/17/2020 4:07 PM Performed by: Inda Coke, CRNA Pre-anesthesia Checklist: Patient identified, Emergency Drugs available, Suction available and Patient being monitored Patient Re-evaluated:Patient Re-evaluated prior to induction Oxygen Delivery Method: Circle System Utilized Preoxygenation: Pre-oxygenation with 100% oxygen Induction Type: IV induction Ventilation: Mask ventilation without difficulty Laryngoscope Size: Glidescope and 4 Grade View: Grade I Tube type: Oral Endobronchial tube: Double lumen EBT and 37 Fr Tube size: 8.0 mm Number of attempts: 1 Airway Equipment and Method: Stylet and Oral airway Placement Confirmation: ETT inserted through vocal cords under direct vision,  positive ETCO2 and breath sounds checked- equal and bilateral Secured at: 28 cm Tube secured with: Tape Dental Injury: Teeth and Oropharynx as per pre-operative assessment and Injury to lip  Comments: DL X 1 with MAC 4 blade. Grade I view. Unable to pass DLT. DL X 2 with MAC 4 blade with Grade I view. Esophageal intubation. Dr. Nyoka Cowden DL X 1. Unable to pass DLT through cords.small nicks to upper and lower lip.  DL X 1 by CRNA with glidescope MAC 4 blade with grade I view and smooth intubation with 8.0 OETT. OETT exchanged via cook catheter with 37 FR DLT and smooth intubation, + etco2

## 2020-05-17 NOTE — Op Note (Signed)
[address]       [place] [address]             [phone]       05/17/2020 Patient:  Evan Chavez Pre-Op Dx: Esophageal perforation   History of esophageal stricture secondary to reflux disease   History of hiatal hernia Post-op Dx: Same Procedure: -Right video assisted thoracoscopy - Decortication - Esophagogastroscopy -Percutaneous gastrostomy tube placement -Esophageal stent placement with a 25 x 125 mm [employer] stent  Surgeon and Role:      * Chyanna Flock, Lucile Crater, MD - Primary    *Jadene Pierini, PA-C- assisting  Anesthesia  general EBL: Minimal  Blood Administration: None Specimen: Pleural peel  Drains: 28 [place] F argyle, and 19 [place] Blake chest tube in right chest Counts: correct    Indications: 59 year old male transferred from [facility] with an esophageal perforation.  He has a long history of peptic strictures that have been dilated on multiple occasions.  It appears as though this may be the culprit for his perforation.  He was originally treated for an aspiration pneumonia and was recently made n.p.o. given this new finding on cross-sectional imaging.  I discussed the risks and benefits of a right thoracoscopy with washout esophageal stent placement and percutaneous gastrostomy tube placement.  He is agreeable to proceed.  Findings: Fibrinopurulent empyema.  We were able to achieve good expansion of the lung following the decortication.  During the upper endoscopy, the esophagus was pallidus.  There is an obvious perforation that was noted at 32 cm from the incisors.  We did not encounter much resistance, and were able to pass the scope into the stomach.  There was some diffuse nodularity of the stomach.  The PEG tube was placed without complications.  We performed a contrasted study of the esophagus and an obvious leak into the right chest was identified.  Upon deploying the stent it did not flare distally likely due to  to the previous stricture which was just distal to the perforation site.  Upon pulling the stent back it finally did deploy and released from the catheter.  There was good coverage of the perforation.  We repeated the contrasted study and there was no evidence of leak at this point.  Operative Technique: After the risks, benefits and alternatives were thoroughly discussed, the patient was brought to the operative theatre.  Anesthesia was induced, the patient was then placed in a left lateral decubitus position and was prepped and draped in normal sterile fashion.  An appropriate surgical pause was performed, and pre-operative antibiotics were dosed accordingly.  We began with 2cm incision in the anterior axillary line at the 5th intercostal space.  The chest was entered, and we then placed a 1cm incision at the 10th intercostal space, and introduced our camera port.  The lung was directly visualized.  There was a fibrinopurulent empyema with lung entrapment.   The lung was then mobilized off of the chest wall.  The pleural peal was carefully decorticated off of each lobe.  The fissure was then mobilized.  We achieved good expansion of the lung.  The chest was then irrigated.    A 28 F and a 19 French chest tube were then placed, and we watch the lung re-expand.  The skin and soft tissue were closed with absorbable suture.  The patient was then placed in a supine position and we began with the second portion of the procedure.  The gastroscope was advanced through the oropharynx into the cervical esophagus under direct visualization.  The scope was passed into the stomach.  The scope was then pulled back, and the esophageal mucosa was visualized.  A mucosal defect was noted at 32 cm from the incisors.  Contrast was injected through the scope.  Extravasation was noted flowing into the right chest..  This area was marked on the skin with paper clips.  The scope was then advanced back into the stomach, which was  then insufflated.  1:1 external compression through the abdominal wall was evident, and we were able to trans-illuminate through the skin.  Using Seldinger technique, a needle was passed through the skin, and a wire was passed and grasped via the gastroscope.  The PEG tube was then positioned using a pull technique.  The PEG tube button was then secured at the skin at 2 cm.    Next a Jag wire was passed through the gastroscope into the stomach with fluoroscopic guidance.  The esophageal stent was then placed over the wire, and positioned under fluoroscopy to cover the mucosal defect.  Initially the stent did not release from the deployment device but upon pulling it back some I did release and cover the mucosal defect nicely.  Contrast was injected again in the esophagus and there is no evidence of extravasation.  The patient tolerated the procedure without any immediate complications, and was transferred to the PACU in stable condition.  Evan Chavez Evan Chavez

## 2020-05-17 NOTE — Progress Notes (Signed)
Name: Evan Chavez MRN: [id number] DOB: 07-Oct-1961    ADMISSION DATE:  05/10/2020 CONSULTATION DATE:  05/17/2020   REFERRING MD :  Tat, triad MD  CHIEF COMPLAINT:  Pneumonia, effusions  BRIEF PATIENT DESCRIPTION: 59 year old man with Barrett's esophagus and esophageal stricture admitted with bibasilar pneumonia and loculated effusions.  Similar admission for bibasal pna 08/2018 Developed abdominal pain and found left hydronephrosis with ureteral obstruction   He was hospitalized November 2019 with bibasal pneumonia and imaging showed esophageal stricture and impaired esophageal motility. EGD confirmed severe reflux esophagitis with peptic ulcer disease with extensive gastric erosions and duodenal ulcer.  Biopsy revealed Barrett's esophagus. He had a repeat EGD 12/2018 with balloon dilatation for peptic stricture, his dysphagia improved and surveillance EGD was advised  SIGNIFICANT EVENTS  8/2 US guided LT thora >> apical pneumothorax 8/4 R chest tube placement, transfer to [facility] for CTS eval  STUDIES:   EGD 12/2018 esophageal peptic  Stricture , dilated , Barrett'sesophagus   CT angio 08/2018 >> bibasal pneumonia Esophagram 08/2018 >> esophagitis. Focal area of relative narrowing of the esophagus is identified estimated over 5 cm length  Diffusely impaired esophageal motility.  Laryngeal penetration without gross aspiration  Ct angio chest 7/29 >>Bilateral pleural effusions and lower lobe consolidation  Diffuse wall thickening throughout the esophagus -stable from a prior exam of 2019  CT abd/pelvis W con 8/1 >> worsening of LEFT lower lobe airspace disease that may represent a combination of volume loss and pneumonitis Loculated bilateral basilar effusions with similar appearance Ct chest 8/4: 1. Overall findings consistent with esophageal perforation arising from the right lateral aspect of the mid to distal esophagus as detailed above. There is extraluminal oral  contrast tracking from this esophageal wall defect into the right pleural space. 2. Large right-sided hydropneumothorax with significant interval worsening since the prior study despite right-sided chest tube placement. Additionally, the fluid component appears more complex. The right-sided chest tube appears to enter the thoracic cavity inferiorly and courses along the right hemidiaphragm and terminates in the region of the right epicardial fat pad. The tube is kinked at its tip. While the tube is likely intrathoracic, it will need significant repositioning to be effective. 3. Findings consistent with interval aspiration with complete occlusion of bronchus intermedius as well as the right middle and right lower lobe bronchi. There is likely developing aspiration pneumonia within the collapsed right middle and right lower lobes as well as the posterior left upper and left lower lobes. There is a small left-sided pleural effusion. 4. Persistent diffuse esophageal wall thickening of unknown clinical significance. Correlation with endoscopy is recommended as an underlying mass is not excluded. 5. Again noted is a left-sided thyroid nodule measuring approximately 2.4 cm. Outpatient thyroid ultrasound follow-up is recommended.(Ref: J Am Coll Radiol. 2015 Feb;12(2): 143-50). 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  has a past surgical history that includes Esophagogastroduodenoscopy (egd) with propofol (N/A, 08/25/2018); biopsy (08/25/2018); Esophagogastroduodenoscopy (egd) with propofol (N/A, 12/13/2018); biopsy (12/13/2018); Colonoscopy with propofol (N/A, 04/07/2019); and polypectomy (04/07/2019).     SUBJECTIVE:   8/5: Afebrile, on 6L oxygen with sats 93%. Pt endorses some pain at chest  tube site. Would like water but advised that he cannot take po. No other complaints    VITAL SIGNS: Temp:  [97.8 F (  36.6 C)-99.2 F (37.3 C)] 99.2 F (37.3 C) (08/05 0757) Pulse Rate:  [104-127] 114 (08/05 0800) Resp:  [24-40] 30 (08/05 0800) BP: (141-165)/(80-99) 155/91 (08/05 0800) SpO2:  [87 %-100 %] 93 % (08/05 0800) Weight:  [63.6 kg] 63.6 kg (08/04 2200)  PHYSICAL EXAMINATION: General: Middle-aged man, reclining in bed Neuro: Left facial weakness, good strength in all extremities HEENT: No pallor, icterus, no JVD, mm dry but pink Cardiovascular: S1-S2 regular Lungs: Decreased breath sounds bilateral, no crackles + rhonchi on R Abdomen: Soft, nontender, non distended Musculoskeletal: No deformity Skin: No rash  CXR 8/4: personally reviewed shows increased right effusion with ptx personally reviewed by me  Recent Labs  Lab 05/14/20 0604 05/15/20 0331 05/16/20 0519  NA 137 136 137  K 3.7 3.8 3.7  CL 102 101 99  CO2 25 24 25   BUN 9 10 11   CREATININE 0.84 1.02 0.91  GLUCOSE 128* 114* 121*   Recent Labs  Lab 05/14/20 0604 05/15/20 0331 05/16/20 0519  HGB 9.1* 9.2* 8.5*  HCT 29.5* 29.7* 27.6*  WBC 12.7* 16.0* 15.7*  PLT 287 354 428*   CT CHEST WO CONTRAST  Result Date: 05/16/2020 CLINICAL DATA:  Pleural effusion. EXAM: CT CHEST WITHOUT CONTRAST TECHNIQUE: Multidetector CT imaging of the chest was performed following the standard protocol without IV contrast. COMPARISON:  CT dated May 10, 2020 FINDINGS: Cardiovascular: The heart size is stable. There is some shift of the mediastinum to the left. There is a trace pericardial effusion. Mild atherosclerotic changes are noted of the thoracic aorta. Mediastinum/Nodes: --mild mediastinal adenopathy is noted -- there is mild bilateral hilar adenopathy. -- No axillary lymphadenopathy. -- No supraclavicular lymphadenopathy. --there is a left-sided thyroid nodule measuring approximately 2.4 cm. -the esophagus is diffusely  thickened. There appears to be a defect in the right lateral aspect of the mid to distal esophagus (axial series 3, image 82-83). Adjacent to this defect is extraluminal oral contrast tracking into the right pleural space. Lungs/Pleura: There is been interval placement of a right-sided chest tube. There is a large right-sided hydropneumothorax which has increased in size since the patient's prior study despite placement of a right-sided chest tube. Additionally. The pleural effusion component appears more complex than prior study. There is new extensive debris within bronchus intermedius as well as the right lower lobe and right middle lobe bronchi consistent with significant interval aspiration. The right middle lobe and right lower lobe are essentially collapsed. There are hypoattenuating components within the right middle and right lower lobes concerning for developing aspiration pneumonia. There are new areas of consolidation involving the left lower and left upper lobes concerning for aspiration. There is a small to moderate-sized left-sided pleural effusion. The chest tube is kinked at its distal most aspect. Upper Abdomen: There is no definite acute abnormality in the upper abdomen. Musculoskeletal: No chest wall abnormality. No bony spinal canal stenosis. IMPRESSION: 1. Overall findings consistent with esophageal perforation arising from the right lateral aspect of the mid to distal esophagus as detailed above. There is extraluminal oral contrast tracking from this esophageal wall defect into the right pleural space. 2. Large right-sided hydropneumothorax with significant interval worsening since the prior study despite right-sided chest tube placement. Additionally, the fluid component appears more complex. The right-sided chest tube appears to enter the thoracic cavity inferiorly and courses along the right hemidiaphragm and terminates in the region of the right epicardial fat pad. The tube is kinked at  its tip. While the tube is likely  intrathoracic, it will need significant repositioning to be effective. 3. Findings consistent with interval aspiration with complete occlusion of bronchus intermedius as well as the right middle and right lower lobe bronchi. There is likely developing aspiration pneumonia within the collapsed right middle and right lower lobes as well as the posterior left upper and left lower lobes. There is a small left-sided pleural effusion. 4. Persistent diffuse esophageal wall thickening of unknown clinical significance. Correlation with endoscopy is recommended as an underlying mass is not excluded. 5. Again noted is a left-sided thyroid nodule measuring approximately 2.4 cm. Outpatient thyroid ultrasound follow-up is recommended.(Ref: J Am Coll Radiol. 2015 Feb;12(2): 143-50). These results were called by telephone at the time of interpretation on 05/16/2020 at 6:04 pm to provider [facility] , who verbally acknowledged these results. Electronically Signed   By: Constance Holster M.D.   On: 05/16/2020 18:04   DG CHEST PORT 1 VIEW  Result Date: 05/16/2020 CLINICAL DATA:  Chest tube insertion for pneumothorax. EXAM: PORTABLE CHEST 1 VIEW COMPARISON:  May 16, 2020 FINDINGS: A right-sided chest tube is noted. Its distal end is kinked and is seen overlying the lateral aspect of the right upper quadrant. A large, stable right-sided hydropneumothorax is seen with subsequent collapse of the right lung. This is unchanged in appearance when compared to the prior study. Stable mild to moderate severity atelectasis and/or infiltrate is noted within the left lung base. The heart size and mediastinal contours are within normal limits. The visualized skeletal structures are unremarkable. IMPRESSION: 1. Stable large right-sided hydropneumothorax with subsequent collapse of the right lung. 2. Stable mild to moderate severity left basilar atelectasis and/or infiltrate. 3. Right-sided chest tube with its  distal end kinked and seen overlying the right upper quadrant. Electronically Signed   By: Virgina Norfolk M.D.   On: 05/16/2020 16:52   DG CHEST PORT 1 VIEW  Result Date: 05/16/2020 CLINICAL DATA:  Pneumothorax post recent thoracentesis, shortness of breath EXAM: PORTABLE CHEST 1 VIEW COMPARISON:  Portable exam 0849 hours compared to 05/14/2020 FINDINGS: Stable heart size and mediastinal contours. RIGHT hydropneumothorax, with increased collapse of RIGHT lung since previous exam. Minimal LEFT pleural effusion with atelectasis versus infiltrate LEFT lower lobe. LEFT upper lung clear. Bones demineralized. IMPRESSION: RIGHT hydropneumothorax with increased collapse of RIGHT lung since previous exam. Minimal LEFT pleural effusion with atelectasis versus infiltrate at LEFT lower lobe. Findings called to Dr. Elsworth Soho on 05/16/2020 at 0922 hours. Electronically Signed   By: Lavonia Dana M.D.   On: 05/16/2020 09:38    ASSESSMENT / PLAN:  Acute respiratory failure with hypoxia Bibasilar pneumonia - -Favor aspiration given his longstanding history with reflux esophagitis and esophageal stricture , although he cleared swallow evaluation -titrate oxygen to sats >92% RT complex loculated parapneumonic effusion, freely flowing effusion on left ?  Underlying COPD -outpt f/u when improved for pft Right apical pneumothorax post thoracentesis, stable -cont zosyn and add fluconazole -repeat ekg with borderline qt interval at last check in July -CTS to take to OR today for vats, stent and g tube -pct increasing but wbc stable, lgt tmax 98.3 Sepsis 2/2 aspiration pneumonia and esophageal rupture:  -cont abx -monitor cx.    left hydronephrosis and needs ESWL: lithotripsy has been scheduled on 8/10 -Dr Louis Meckel was seeing while at [facility]  HFpEF:  Chronic and stable   Critical care time: The patient is critically ill with multiple organ systems failure and requires high complexity decision making for assessment and  support, frequent  evaluation and titration of therapies, application of advanced monitoring technologies and extensive interpretation of multiple databases.  Critical care time 35 mins. This represents my time independent of the NPs time taking care of the pt. This is excluding procedures.    [facility] 05/17/2020, 9:01 AM

## 2020-05-18 ENCOUNTER — Encounter (HOSPITAL_COMMUNITY): Payer: Self-pay | Admitting: Thoracic Surgery (Cardiothoracic Vascular Surgery)

## 2020-05-18 ENCOUNTER — Inpatient Hospital Stay (HOSPITAL_COMMUNITY): Payer: Medicaid Other

## 2020-05-18 LAB — BASIC METABOLIC PANEL
Anion gap: 12 (ref 5–15)
BUN: 10 mg/dL (ref 6–20)
CO2: 24 mmol/L (ref 22–32)
Calcium: 7.9 mg/dL — ABNORMAL LOW (ref 8.9–10.3)
Chloride: 106 mmol/L (ref 98–111)
Creatinine, Ser: 0.95 mg/dL (ref 0.61–1.24)
GFR calc Af Amer: >60 mL/min (ref >60)
GFR calc non Af Amer: >60 mL/min (ref >60)
Glucose, Bld: 139 mg/dL — ABNORMAL HIGH (ref 70–99)
Potassium: 4.1 mmol/L (ref 3.5–5.1)
Sodium: 142 mmol/L (ref 135–145)

## 2020-05-18 LAB — CBC
HCT: 25.2 % — ABNORMAL LOW (ref 39.0–52.0)
Hemoglobin: 7.8 g/dL — ABNORMAL LOW (ref 13.0–17.0)
MCH: 26.9 pg (ref 26.0–34.0)
MCHC: 31 g/dL (ref 30.0–36.0)
MCV: 86.9 fL (ref 80.0–100.0)
Platelets: 469 10*3/uL — ABNORMAL HIGH (ref 150–400)
RBC: 2.9 MIL/uL — ABNORMAL LOW (ref 4.22–5.81)
RDW: 13.7 % (ref 11.5–15.5)
WBC: 24.3 10*3/uL — ABNORMAL HIGH (ref 4.0–10.5)
nRBC: 0.1 % (ref 0.0–0.2)

## 2020-05-18 LAB — GLUCOSE, CAPILLARY
Glucose-Capillary: 127 mg/dL — ABNORMAL HIGH (ref 70–99)
Glucose-Capillary: 111 mg/dL — ABNORMAL HIGH (ref 70–99)
Glucose-Capillary: 117 mg/dL — ABNORMAL HIGH (ref 70–99)
Glucose-Capillary: 132 mg/dL — ABNORMAL HIGH (ref 70–99)
Glucose-Capillary: 146 mg/dL — ABNORMAL HIGH (ref 70–99)

## 2020-05-18 MED ORDER — OSMOLITE 1.5 CAL PO LIQD
1000.0000 mL | ORAL | Status: DC
  Administered 2020-05-18 – 2020-05-24 (×8): 1000 mL
  Filled 2020-05-18 (×11): qty 1000

## 2020-05-18 MED ORDER — ACETAMINOPHEN 160 MG/5ML PO SOLN
1000.0000 mg | Freq: Four times a day (QID) | ORAL | Status: AC
  Administered 2020-05-19 – 2020-05-22 (×14): 1000 mg
  Filled 2020-05-18 (×16): qty 40.6

## 2020-05-18 MED ORDER — PROSOURCE TF PO LIQD
45.0000 mL | Freq: Two times a day (BID) | ORAL | Status: DC
  Administered 2020-05-18 – 2020-05-24 (×13): 45 mL
  Filled 2020-05-18 (×15): qty 45

## 2020-05-18 MED ORDER — SENNOSIDES 8.8 MG/5ML PO SYRP
5.0000 mL | ORAL_SOLUTION | Freq: Every day | ORAL | Status: DC
  Administered 2020-05-19 – 2020-07-11 (×34): 5 mL
  Filled 2020-05-18 (×49): qty 5

## 2020-05-18 MED ORDER — ACETAMINOPHEN 500 MG PO TABS
1000.0000 mg | ORAL_TABLET | Freq: Four times a day (QID) | ORAL | Status: AC
  Administered 2020-05-19: 1000 mg via ORAL
  Filled 2020-05-18 (×3): qty 2

## 2020-05-18 MED ORDER — IPRATROPIUM-ALBUTEROL 0.5-2.5 (3) MG/3ML IN SOLN: 3.0000 mL | Freq: Four times a day (QID) | RESPIRATORY_TRACT | Status: DC | PRN

## 2020-05-18 NOTE — Progress Notes (Signed)
Pts order is PRN for BIPAP at this time. Pts respiratory status is stable on 3Lpm [place] w/no distress noted. Pt not in need of BIPAP at this time. RT will continue to monitor.

## 2020-05-18 NOTE — Progress Notes (Addendum)
Name: Evan Chavez MRN: [id number] DOB: April 24, 1961    ADMISSION DATE:  05/10/2020 CONSULTATION DATE:  05/18/2020   REFERRING MD :  Tat, triad MD  CHIEF COMPLAINT:  Pneumonia, effusions  BRIEF PATIENT DESCRIPTION: 59 year old man with Barrett's esophagus and esophageal stricture admitted with bibasilar pneumonia and loculated effusions.  Similar admission for bibasal pna 08/2018 Developed abdominal pain and found left hydronephrosis with ureteral obstruction   He was hospitalized November 2019 with bibasal pneumonia and imaging showed esophageal stricture and impaired esophageal motility. EGD confirmed severe reflux esophagitis with peptic ulcer disease with extensive gastric erosions and duodenal ulcer.  Biopsy revealed Barrett's esophagus. He had a repeat EGD 12/2018 with balloon dilatation for peptic stricture, his dysphagia improved and surveillance EGD was advised  SIGNIFICANT EVENTS  8/2 US guided LT thora >> apical pneumothorax 8/4 R chest tube placement, transfer to [facility] for CTS eval  STUDIES:   EGD 12/2018 esophageal peptic  Stricture , dilated , Barrett'sesophagus   CT angio 08/2018 >> bibasal pneumonia Esophagram 08/2018 >> esophagitis. Focal area of relative narrowing of the esophagus is identified estimated over 5 cm length  Diffusely impaired esophageal motility.  Laryngeal penetration without gross aspiration  Ct angio chest 7/29 >>Bilateral pleural effusions and lower lobe consolidation  Diffuse wall thickening throughout the esophagus -stable from a prior exam of 2019  CT abd/pelvis W con 8/1 >> worsening of LEFT lower lobe airspace disease that may represent a combination of volume loss and pneumonitis Loculated bilateral basilar effusions with similar appearance Ct chest 8/4: 1. Overall findings consistent with esophageal perforation arising from the right lateral aspect of the mid to distal esophagus as detailed above. There is extraluminal oral  contrast tracking from this esophageal wall defect into the right pleural space. 2. Large right-sided hydropneumothorax with significant interval worsening since the prior study despite right-sided chest tube placement. Additionally, the fluid component appears more complex. The right-sided chest tube appears to enter the thoracic cavity inferiorly and courses along the right hemidiaphragm and terminates in the region of the right epicardial fat pad. The tube is kinked at its tip. While the tube is likely intrathoracic, it will need significant repositioning to be effective. 3. Findings consistent with interval aspiration with complete occlusion of bronchus intermedius as well as the right middle and right lower lobe bronchi. There is likely developing aspiration pneumonia within the collapsed right middle and right lower lobes as well as the posterior left upper and left lower lobes. There is a small left-sided pleural effusion. 4. Persistent diffuse esophageal wall thickening of unknown clinical significance. Correlation with endoscopy is recommended as an underlying mass is not excluded. 5. Again noted is a left-sided thyroid nodule measuring approximately 2.4 cm. Outpatient thyroid ultrasound follow-up is recommended.(Ref: J Am Coll Radiol. 2015 Feb;12(2): 143-50). Echo 7/30: lvef 65% with grade I diastolic dysfunction      PAST MEDICAL HISTORY :   has a past medical history of Cocaine abuse ([place]), ETOH abuse, GERD (gastroesophageal reflux disease), Hyperlipidemia, and Stroke (cerebrum) ([place]) (06/2018).  has a past surgical history that includes Esophagogastroduodenoscopy (egd) with propofol (N/A, 08/25/2018); biopsy (08/25/2018); Esophagogastroduodenoscopy (egd) with propofol (N/A, 12/13/2018); biopsy (12/13/2018); Colonoscopy with propofol (N/A, 04/07/2019); polypectomy (04/07/2019); Video assisted thoracoscopy (vats)/decortication (Right, 05/17/2020); esophageal stent placement (N/A,  05/17/2020); and Esophagogastroduodenoscopy (N/A, 05/17/2020).     SUBJECTIVE:  8/6: s/p OR time yesterday. With new peg and chest tubes in place on [place] at this time. Pt denies pain and pulmonary exam improved.  8/5: Afebrile, on 6L oxygen with sats 93%. Pt endorses some pain at chest tube site. Would like water but advised that he cannot take po. No other complaints    VITAL SIGNS: Temp:  [97.3 F (36.3 C)-99 F (37.2 C)] 98.4 F (36.9 C) (08/06 1100) Pulse Rate:  [96-123] 104 (08/06 1500) Resp:  [18-46] 26 (08/06 1500) BP: (134-161)/(81-98) 146/82 (08/06 1500) SpO2:  [88 %-100 %] 92 % (08/06 1500) Arterial Line BP: (102-195)/(73-117) 125/117 (08/06 0400) FiO2 (%):  [40 %-80 %] 40 % (08/06 0434)  PHYSICAL EXAMINATION: General: Middle-aged man, reclining in bed Neuro: Left facial weakness, good strength in all extremities HEENT: No pallor, icterus, no JVD, mm dry but pink Cardiovascular: S1-S2 regular Lungs: Decreased breath sounds bilateral, no crackles  Abdomen: Soft, nontender, non distended, peg in place dressing c/d/i Musculoskeletal: No deformity Skin: No rash  8/6: cxr with post surgical changes and effusion present, personally reviewed by me   Recent Labs  Lab 05/15/20 0331 05/15/20 0331 05/16/20 0519 05/17/20 2036 05/18/20 0341  NA 136   < > 137 142 142  K 3.8   < > 3.7 4.0 4.1  CL 101  --  99  --  106  CO2 24  --  25  --  24  BUN 10  --  11  --  10  CREATININE 1.02  --  0.91  --  0.95  GLUCOSE 114*  --  121*  --  139*   < > = values in this interval not displayed.   Recent Labs  Lab 05/15/20 0331 05/15/20 0331 05/16/20 0519 05/17/20 2036 05/18/20 0341  HGB 9.2*   < > 8.5* 9.9* 7.8*  HCT 29.7*   < > 27.6* 29.0* 25.2*  WBC 16.0*  --  15.7*  --  24.3*  PLT 354  --  428*  --  469*   < > = values in this interval not displayed.   DG Chest 1 View  Result Date: 05/17/2020 CLINICAL DATA:  Surgery. Esophageal stent placement. EXAM: DG C-ARM 1-60 MIN; CHEST   1 VIEW FLUOROSCOPY TIME:  Fluoroscopy Time:  5 minutes 48 seconds Radiation Exposure Index (if provided by the fluoroscopic device): 59.06 mGy Number of Acquired Spot Images: 3 COMPARISON:  Preprocedural CT yesterday. FINDINGS: Three fluoroscopic spot views obtained in the operating room. Endoscope is in place. A stent overlies the endoscope with tip projecting over the esophageal diaphragmatic hiatus. Subsequent enteric tube courses in the stent. There is contrast opacifying the distal stent on image 1. Right chest tube is in place. IMPRESSION: Intraoperative fluoroscopy during esophageal stent placement. Please reference procedural report for details. Electronically Signed   By: Keith Rake M.D.   On: 05/17/2020 19:55   CT CHEST WO CONTRAST  Result Date: 05/16/2020 CLINICAL DATA:  Pleural effusion. EXAM: CT CHEST WITHOUT CONTRAST TECHNIQUE: Multidetector CT imaging of the chest was performed following the standard protocol without IV contrast. COMPARISON:  CT dated May 10, 2020 FINDINGS: Cardiovascular: The heart size is stable. There is some shift of the mediastinum to the left. There is a trace pericardial effusion. Mild atherosclerotic changes are noted of the thoracic aorta. Mediastinum/Nodes: --mild mediastinal adenopathy is noted -- there is mild bilateral hilar adenopathy. -- No axillary lymphadenopathy. -- No supraclavicular lymphadenopathy. --there is a left-sided thyroid nodule measuring approximately 2.4 cm. -the esophagus is diffusely thickened. There appears to be a defect in the right lateral aspect of the mid to distal esophagus (axial series 3,  image 82-83). Adjacent to this defect is extraluminal oral contrast tracking into the right pleural space. Lungs/Pleura: There is been interval placement of a right-sided chest tube. There is a large right-sided hydropneumothorax which has increased in size since the patient's prior study despite placement of a right-sided chest tube. Additionally.  The pleural effusion component appears more complex than prior study. There is new extensive debris within bronchus intermedius as well as the right lower lobe and right middle lobe bronchi consistent with significant interval aspiration. The right middle lobe and right lower lobe are essentially collapsed. There are hypoattenuating components within the right middle and right lower lobes concerning for developing aspiration pneumonia. There are new areas of consolidation involving the left lower and left upper lobes concerning for aspiration. There is a small to moderate-sized left-sided pleural effusion. The chest tube is kinked at its distal most aspect. Upper Abdomen: There is no definite acute abnormality in the upper abdomen. Musculoskeletal: No chest wall abnormality. No bony spinal canal stenosis. IMPRESSION: 1. Overall findings consistent with esophageal perforation arising from the right lateral aspect of the mid to distal esophagus as detailed above. There is extraluminal oral contrast tracking from this esophageal wall defect into the right pleural space. 2. Large right-sided hydropneumothorax with significant interval worsening since the prior study despite right-sided chest tube placement. Additionally, the fluid component appears more complex. The right-sided chest tube appears to enter the thoracic cavity inferiorly and courses along the right hemidiaphragm and terminates in the region of the right epicardial fat pad. The tube is kinked at its tip. While the tube is likely intrathoracic, it will need significant repositioning to be effective. 3. Findings consistent with interval aspiration with complete occlusion of bronchus intermedius as well as the right middle and right lower lobe bronchi. There is likely developing aspiration pneumonia within the collapsed right middle and right lower lobes as well as the posterior left upper and left lower lobes. There is a small left-sided pleural effusion. 4.  Persistent diffuse esophageal wall thickening of unknown clinical significance. Correlation with endoscopy is recommended as an underlying mass is not excluded. 5. Again noted is a left-sided thyroid nodule measuring approximately 2.4 cm. Outpatient thyroid ultrasound follow-up is recommended.(Ref: J Am Coll Radiol. 2015 Feb;12(2): 143-50). These results were called by telephone at the time of interpretation on 05/16/2020 at 6:04 pm to provider [facility] , who verbally acknowledged these results. Electronically Signed   By: Constance Holster M.D.   On: 05/16/2020 18:04   DG CHEST PORT 1 VIEW  Result Date: 05/18/2020 CLINICAL DATA:  Esophageal perforation. EXAM: PORTABLE CHEST 1 VIEW COMPARISON:  05/17/2020. FINDINGS: Esophageal stent stable position. Two right chest tubes in stable position. Stable cardiomegaly. No pulmonary venous congestion. Low lung volumes with bibasilar atelectasis. Bilateral pulmonary infiltrates/edema again noted. Interim slight increase in right-sided pleural effusion. No pneumothorax noted on today's exam. IMPRESSION: 1. Esophageal stent and 2 right chest tubes noted in stable position. No pneumothorax noted on today's exam. Interim slight increase in right pleural effusion. 2. Low lung volumes with bibasilar atelectasis. Persistent bilateral pulmonary infiltrates/edema without interim change. 3.  Stable cardiomegaly.  No pulmonary venous congestion Electronically Signed   By: Marcello Moores  Register   On: 05/18/2020 05:41   DG Chest Port 1 View  Result Date: 05/17/2020 CLINICAL DATA:  Esophageal stent placement. EXAM: PORTABLE CHEST 1 VIEW COMPARISON:  Chest CT yesterday. FINDINGS: Interval placement of esophageal stent. Two right chest tubes in place. Decreased right hydropneumothorax. Persistent  pleural fluid in extrapleural air at the right lung base. Patchy opacity in the right mid lower lung zone. Small left pleural effusion and basilar opacities. Heart size grossly stable. There is  contrast within the stomach. IMPRESSION: 1. Interval placement of esophageal stent. 2. Two right chest tubes in place with decreased right hydropneumothorax. Small volume of residual pleural fluid in extrapleural air at the right lung base. 3. Patchy opacity in the right mid and lower lung zone, may represent atelectasis or re-expansion pulmonary edema. 4. Small left pleural effusion and basilar opacities. Electronically Signed   By: Keith Rake M.D.   On: 05/17/2020 19:57   DG CHEST PORT 1 VIEW  Result Date: 05/16/2020 CLINICAL DATA:  Chest tube insertion for pneumothorax. EXAM: PORTABLE CHEST 1 VIEW COMPARISON:  May 16, 2020 FINDINGS: A right-sided chest tube is noted. Its distal end is kinked and is seen overlying the lateral aspect of the right upper quadrant. A large, stable right-sided hydropneumothorax is seen with subsequent collapse of the right lung. This is unchanged in appearance when compared to the prior study. Stable mild to moderate severity atelectasis and/or infiltrate is noted within the left lung base. The heart size and mediastinal contours are within normal limits. The visualized skeletal structures are unremarkable. IMPRESSION: 1. Stable large right-sided hydropneumothorax with subsequent collapse of the right lung. 2. Stable mild to moderate severity left basilar atelectasis and/or infiltrate. 3. Right-sided chest tube with its distal end kinked and seen overlying the right upper quadrant. Electronically Signed   By: Virgina Norfolk M.D.   On: 05/16/2020 16:52   DG C-Arm 1-60 Min  Result Date: 05/17/2020 CLINICAL DATA:  Surgery. Esophageal stent placement. EXAM: DG C-ARM 1-60 MIN; CHEST  1 VIEW FLUOROSCOPY TIME:  Fluoroscopy Time:  5 minutes 48 seconds Radiation Exposure Index (if provided by the fluoroscopic device): 59.06 mGy Number of Acquired Spot Images: 3 COMPARISON:  Preprocedural CT yesterday. FINDINGS: Three fluoroscopic spot views obtained in the operating room.  Endoscope is in place. A stent overlies the endoscope with tip projecting over the esophageal diaphragmatic hiatus. Subsequent enteric tube courses in the stent. There is contrast opacifying the distal stent on image 1. Right chest tube is in place. IMPRESSION: Intraoperative fluoroscopy during esophageal stent placement. Please reference procedural report for details. Electronically Signed   By: Keith Rake M.D.   On: 05/17/2020 19:55    ASSESSMENT / PLAN:  Acute respiratory failure with hypoxia Bibasilar pneumonia - -Favor aspiration given his longstanding history with reflux esophagitis and esophageal stricture , although he cleared swallow evaluation -titrate oxygen to sats >92% RT complex loculated parapneumonic effusion, freely flowing effusion on left ?  Underlying COPD -outpt f/u when improved for pft Right apical pneumothorax post thoracentesis, stable -cont zosyn and fluconazole -qt is stable at last check -appreciate cts -ok to start tf per surgery -wbc increased, cont to follow Sepsis 2/2 aspiration pneumonia and esophageal rupture:  -cont abx -monitor cx.    left hydronephrosis and needs ESWL: lithotripsy has been scheduled on 8/10 -Dr Louis Meckel was seeing while at [facility], will need to be reached back out to with this change in clinical status.   HFpEF:  Chronic and stable  Dispo: stable for transfer from ICU to progressive. Will ask TRH to assume care 8/7 and CCM will sign off  care time 35 mins. This represents my time independent of the NPs time taking care of the pt. This is excluding procedures.    [place]  Pulmonary and Critical Care 05/18/2020, 3:04 PM

## 2020-05-18 NOTE — Progress Notes (Signed)
Nutrition Follow-up  DOCUMENTATION CODES:   Non-severe (moderate) malnutrition in context of chronic illness  INTERVENTION:   Initiate tube feeding via PEG: - Start Osmolite 1.5 @ 25 ml/hr and titrate by 10 ml q 4 hours until goal rate of 55 ml/hr is reached (1320 ml/day) - ProSource TF 45 ml BID  Tube feeding regimen at goal would provide 2060 kcal, 105 grams of protein, and 1006 ml of H2O.  Monitor magnesium, potassium, and phosphorus daily for at least 3 days, MD to replete as needed, as pt is at risk for refeeding syndrome given malnutrition, poor PO intake x 2 weeks.  NUTRITION DIAGNOSIS:   Moderate Malnutrition related to chronic illness (Barrett's esophagus, GERD) as evidenced by moderate fat depletion, moderate muscle depletion.  Ongoing  GOAL:   Patient will meet greater than or equal to 90% of their needs  Progressing  MONITOR:   Diet advancement, Labs, Weight trends, TF tolerance, Skin, I & O's  REASON FOR ASSESSMENT:   Consult Enteral/tube feeding initiation and management  ASSESSMENT:   59 year old male who presented on 7/28 with fever, SOB. PMH of stroke, HLD, GERD, Barrett's esophagus, PUD, s/p balloon stricturoplasty for GE junction in 2020. Admitted with severe sepsis secondary to bilateral pleural effusions with concomitant bibasilar pneumonia.  8/02 - MBSS with recommendation for regular diet with thin liquids, s/p thoracentesis 8/04 - s/p chest tube insertion, chest CT showing esophageal perforation 8/05 - s/p VATS, esophageal stent placement, PEG tube placement  Received consult for tube feeding initiation and management. Discussed pt with RN and during ICU rounds.  Spoke with pt briefly at bedside. Pt converses minimally.  Weight down 8 lbs since admit.  Medications reviewed and include: dulcolax, senna, IV abx, IV diflucan IVF: NS @ 75 ml/hr  Labs reviewed: hemoglobin 7.8, ionized calcium 1.00 CBG's: 96-155 x 24 hours  UOP: 2275 ml x 24  hours RUQ JP drain: 45 ml x 12 hours PEG: 50 ml x 12 hours CT: 165 ml x 24 hours  Diet Order:   Diet Order            Diet NPO time specified  Diet effective now                 EDUCATION NEEDS:   Education needs have been addressed  Skin:  Skin Assessment: Skin Integrity Issues: Incisions: right chest  Last BM:  05/15/20  Height:   Ht Readings from Last 1 Encounters:  05/17/20 5\' 9"  (1.753 m)    Weight:   Wt Readings from Last 1 Encounters:  05/17/20 63.6 kg    Ideal Body Weight:  72.7 kg  BMI:  Body mass index is 20.71 kg/m.  Estimated Nutritional Needs:   Kcal:  2000-2200  Protein:  100-115 grams  Fluid:  >/= 2.0 L    Gaynell Face, MS, RD, LDN Inpatient Clinical Dietitian Please see [facility] for contact information.

## 2020-05-18 NOTE — Progress Notes (Addendum)
Mother Peter Congo) and sister called - updated on patients current condition. All questions answered at this time.   Antonieta Pert, RN  05/18/2020 2145

## 2020-05-18 NOTE — Progress Notes (Addendum)
Pt transferred to [facility] service. No indication for CCM to continue to follow  Chest tubes followed by CTS and enteral diet will be initiated when they recommend  abx plan for following cx and based off that. If esophageal repair remains stable will do fluc for 7 days post repair. Zosyn or equivalent for at least  14 days post culture.   We will sign off at this time.  Please call with any further questions  Update TRH did not see pt and it appears was changed back to my service from Dr Doristine Devoid service.   Will call TR[facility] to determine but will certainly be transferring to their service tomorrow.

## 2020-05-18 NOTE — Progress Notes (Addendum)
[address]       [place] [address]             [phone]       1 Day Post-Op Procedure(s) (LRB): RIGHT VIDEO ASSISTED THORACOSCOPY (VATS)/DECORTICATION (Right) ESOPHAGEAL STENT PLACEMENT AND  PEG TUBE PLACEMENT (N/A) ESOPHAGOGASTRODUODENOSCOPY (EGD) (N/A) Subjective: Awake and alert, converses minimally.  Objective: Vital signs in last 24 hours: Temp:  [97.3 F (36.3 C)-99 F (37.2 C)] 98.5 F (36.9 C) (08/06 0700) Pulse Rate:  [96-123] 96 (08/06 0600) Cardiac Rhythm: Sinus tachycardia (08/06 0400) Resp:  [18-38] 26 (08/06 0600) BP: (134-162)/(85-98) 142/92 (08/06 0600) SpO2:  [88 %-100 %] 97 % (08/06 0834) Arterial Line BP: (102-195)/(73-117) 125/117 (08/06 0400) FiO2 (%):  [40 %-80 %] 40 % (08/06 0434) Weight:  [63.6 kg] 63.6 kg (08/05 1425)    Intake/Output from previous day: 08/05 0701 - 08/06 0700 In: 3631.9 [I.V.:3031.5; IV Piggyback:350.4] Out: 2635 [Urine:2275; Drains:95; Blood:100; Chest Tube:165] Intake/Output this shift: No intake/output data recorded.  General appearance: alert, cooperative and mild distress Neurologic: No obvious deficits. Heart: S. Tach. Lungs: Coarse breath sounds bilaterally.  CT drainage is thin, serosanguinous fluid. Abdomen: Soft, expected mild tendernes at PEG exit site. Dressings are dry.  Wound: The right CTs are secure, dresings dry.   Lab Results: Recent Labs    05/16/20 0519 05/16/20 0519 05/17/20 2036 05/18/20 0341  WBC 15.7*  --   --  24.3*  HGB 8.5*   < > 9.9* 7.8*  HCT 27.6*   < > 29.0* 25.2*  PLT 428*  --   --  469*   < > = values in this interval not displayed.   BMET:  Recent Labs    05/16/20 0519 05/16/20 0519 05/17/20 2036 05/18/20 0341  NA 137   < > 142 142  K 3.7   < > 4.0 4.1  CL 99  --   --  106  CO2 25  --   --  24  GLUCOSE 121*  --   --  139*  BUN 11  --   --  10  CREATININE 0.91  --   --  0.95  CALCIUM 8.6*  --   --  7.9*   < > = values in this interval not  displayed.    PT/INR: No results for input(s): LABPROT, INR in the last 72 hours. ABG    Component Value Date/Time   PHART 7.402 05/17/2020 2036   HCO3 26.2 05/17/2020 2036   TCO2 27 05/17/2020 2036   O2SAT 94.0 05/17/2020 2036   CBG (last 3)  Recent Labs    05/17/20 2308 05/18/20 0330 05/18/20 0722  GLUCAP 155* 127* 111*    EXAM: PORTABLE CHEST 1 VIEW  COMPARISON:  05/17/2020.  FINDINGS: Esophageal stent stable position. Two right chest tubes in stable position. Stable cardiomegaly. No pulmonary venous congestion. Low lung volumes with bibasilar atelectasis. Bilateral pulmonary infiltrates/edema again noted. Interim slight increase in right-sided pleural effusion. No pneumothorax noted on today's exam.  IMPRESSION: 1. Esophageal stent and 2 right chest tubes noted in stable position. No pneumothorax noted on today's exam. Interim slight increase in right pleural effusion.  2. Low lung volumes with bibasilar atelectasis. Persistent bilateral pulmonary infiltrates/edema without interim change.  3.  Stable cardiomegaly.  No pulmonary venous congestion   Electronically Signed   By: Marcello Moores  Register   On: 05/18/2020 05:41  Assessment/Plan: S/P Procedure(s) (LRB): RIGHT VIDEO ASSISTED THORACOSCOPY (VATS)/DECORTICATION (Right) ESOPHAGEAL  STENT PLACEMENT AND  PEG TUBE PLACEMENT (N/A) ESOPHAGOGASTRODUODENOSCOPY (EGD) (N/A)  -POD1 right VATS for drainage of loculated effusion and decortication and EGD with esophageal stenting for perforation and PEG placement in pt with h/o Barrett's esophagus.    -Consult dietician for initiation of TF per PEG  -Needs to work on clearing secretions from upper airway.  Ordered IS and    flutter valve.  OK to mobilize from CTS standpoint. Continue HHN.   -Leave both chest tubes in place today but will eventually plan to remove     the larger bore tube and leave the [facility] drain in place for several days.   -Continue ABX per  primary team.    LOS: 7 days    Antony Odea, PA-C [phone] 05/18/2020  Agree with above. Continue chest tube to suction for now. Okay to start tube feeds We will obtain swallow study next week.  Jorgina Binning Bary Leriche

## 2020-05-19 ENCOUNTER — Inpatient Hospital Stay (HOSPITAL_COMMUNITY): Payer: Medicaid Other

## 2020-05-19 DIAGNOSIS — K223 Perforation of esophagus: Secondary | ICD-10-CM

## 2020-05-19 LAB — CBC
HCT: 22.7 % — ABNORMAL LOW (ref 39.0–52.0)
Hemoglobin: 7.1 g/dL — ABNORMAL LOW (ref 13.0–17.0)
MCH: 27 pg (ref 26.0–34.0)
MCHC: 31.3 g/dL (ref 30.0–36.0)
MCV: 86.3 fL (ref 80.0–100.0)
Platelets: 491 10*3/uL — ABNORMAL HIGH (ref 150–400)
RBC: 2.63 MIL/uL — ABNORMAL LOW (ref 4.22–5.81)
RDW: 13.8 % (ref 11.5–15.5)
WBC: 17.9 10*3/uL — ABNORMAL HIGH (ref 4.0–10.5)
nRBC: 0.2 % (ref 0.0–0.2)

## 2020-05-19 LAB — BASIC METABOLIC PANEL
Anion gap: 12 (ref 5–15)
BUN: 17 mg/dL (ref 6–20)
CO2: 23 mmol/L (ref 22–32)
Calcium: 7.8 mg/dL — ABNORMAL LOW (ref 8.9–10.3)
Chloride: 111 mmol/L (ref 98–111)
Creatinine, Ser: 0.92 mg/dL (ref 0.61–1.24)
GFR calc Af Amer: >60 mL/min (ref >60)
GFR calc non Af Amer: >60 mL/min (ref >60)
Glucose, Bld: 166 mg/dL — ABNORMAL HIGH (ref 70–99)
Potassium: 3.1 mmol/L — ABNORMAL LOW (ref 3.5–5.1)
Sodium: 146 mmol/L — ABNORMAL HIGH (ref 135–145)

## 2020-05-19 LAB — GLUCOSE, CAPILLARY
Glucose-Capillary: 103 mg/dL — ABNORMAL HIGH (ref 70–99)
Glucose-Capillary: 106 mg/dL — ABNORMAL HIGH (ref 70–99)
Glucose-Capillary: 121 mg/dL — ABNORMAL HIGH (ref 70–99)
Glucose-Capillary: 122 mg/dL — ABNORMAL HIGH (ref 70–99)
Glucose-Capillary: 126 mg/dL — ABNORMAL HIGH (ref 70–99)
Glucose-Capillary: 147 mg/dL — ABNORMAL HIGH (ref 70–99)
Glucose-Capillary: 97 mg/dL (ref 70–99)

## 2020-05-19 LAB — RENAL FUNCTION PANEL
Albumin: 1.8 g/dL — ABNORMAL LOW (ref 3.5–5.0)
Anion gap: 8 (ref 5–15)
BUN: 11 mg/dL (ref 6–20)
CO2: 25 mmol/L (ref 22–32)
Calcium: 8 mg/dL — ABNORMAL LOW (ref 8.9–10.3)
Chloride: 114 mmol/L — ABNORMAL HIGH (ref 98–111)
Creatinine, Ser: 0.85 mg/dL (ref 0.61–1.24)
GFR calc Af Amer: >60 mL/min (ref >60)
GFR calc non Af Amer: >60 mL/min (ref >60)
Glucose, Bld: 133 mg/dL — ABNORMAL HIGH (ref 70–99)
Phosphorus: <1 mg/dL — CL (ref 2.5–4.6)
Potassium: 3.6 mmol/L (ref 3.5–5.1)
Sodium: 147 mmol/L — ABNORMAL HIGH (ref 135–145)

## 2020-05-19 LAB — CULTURE, BODY FLUID W GRAM STAIN -BOTTLE: Culture: NO GROWTH

## 2020-05-19 LAB — ACID FAST SMEAR (AFB, MYCOBACTERIA): Acid Fast Smear: NEGATIVE

## 2020-05-19 LAB — MAGNESIUM: Magnesium: 2 mg/dL (ref 1.7–2.4)

## 2020-05-19 MED ORDER — FREE WATER
200.0000 mL | Status: DC
  Administered 2020-05-19 – 2020-05-25 (×46): 200 mL

## 2020-05-19 MED ORDER — POTASSIUM PHOSPHATES 15 MMOLE/5ML IV SOLN: 45.0000 mmol | Freq: Once | INTRAVENOUS | Status: DC

## 2020-05-19 MED ORDER — POTASSIUM PHOSPHATES 15 MMOLE/5ML IV SOLN
40.0000 mmol | Freq: Once | INTRAVENOUS | Status: AC
  Administered 2020-05-19: 40 mmol via INTRAVENOUS
  Filled 2020-05-19: qty 13.33

## 2020-05-19 MED ORDER — POTASSIUM CHLORIDE 20 MEQ/15ML (10%) PO SOLN
20.0000 meq | ORAL | Status: AC
  Administered 2020-05-19 (×3): 20 meq
  Filled 2020-05-19 (×3): qty 15

## 2020-05-19 NOTE — Progress Notes (Signed)
2 Days Post-Op Procedure(s) (LRB): RIGHT VIDEO ASSISTED THORACOSCOPY (VATS)/DECORTICATION (Right) ESOPHAGEAL STENT PLACEMENT AND  PEG TUBE PLACEMENT (N/A) ESOPHAGOGASTRODUODENOSCOPY (EGD) (N/A) Subjective: Feels well this AM  Objective: Vital signs in last 24 hours: Temp:  [98.3 F (36.8 C)-98.8 F (37.1 C)] 98.6 F (37 C) (08/07 1119) Pulse Rate:  [82-104] 82 (08/07 1000) Cardiac Rhythm: Sinus tachycardia (08/07 0800) Resp:  [20-33] 24 (08/07 1000) BP: (144-167)/(75-90) 159/85 (08/07 1000) SpO2:  [92 %-100 %] 95 % (08/07 1000)  Hemodynamic parameters for last 24 hours:    Intake/Output from previous day: 08/06 0701 - 08/07 0700 In: 2386.5 [I.V.:1614.9; NG/GT:621.8; IV Piggyback:149.8] Out: 2035 [Urine:1650; Drains:285; Chest Tube:100] Intake/Output this shift: Total I/O In: 263.2 [I.V.:150.1; NG/GT:110; IV Piggyback:3.1] Out: 165 [Urine:100; Drains:65]  General appearance: alert, cooperative and no distress Heart: regular rate and rhythm Lungs: diminished breath sounds on right Abdomen: PEG in place slightly cloudy drainage from CT  Lab Results: Recent Labs    05/18/20 0341 05/19/20 0129  WBC 24.3* 17.9*  HGB 7.8* 7.1*  HCT 25.2* 22.7*  PLT 469* 491*   BMET:  Recent Labs    05/18/20 0341 05/19/20 0129  NA 142 146*  K 4.1 3.1*  CL 106 111  CO2 24 23  GLUCOSE 139* 166*  BUN 10 17  CREATININE 0.95 0.92  CALCIUM 7.9* 7.8*    PT/INR: No results for input(s): LABPROT, INR in the last 72 hours. ABG    Component Value Date/Time   PHART 7.402 05/17/2020 2036   HCO3 26.2 05/17/2020 2036   TCO2 27 05/17/2020 2036   O2SAT 94.0 05/17/2020 2036   CBG (last 3)  Recent Labs    05/19/20 0439 05/19/20 0719 05/19/20 1118  GLUCAP 121* 122* 126*    Assessment/Plan: S/P Procedure(s) (LRB): RIGHT VIDEO ASSISTED THORACOSCOPY (VATS)/DECORTICATION (Right) ESOPHAGEAL STENT PLACEMENT AND  PEG TUBE PLACEMENT (N/A) ESOPHAGOGASTRODUODENOSCOPY (EGD) (N/A) -  Esophageal perforation with left empyema  Esophageal stent in place Right pleural space well drained by CXR Continue NPO, TF via PEG Afebrile and WBC trending down on Zosyn    LOS: 8 days    Melrose Nakayama 05/19/2020

## 2020-05-19 NOTE — Progress Notes (Signed)
Critical phosphorus lab of <1.0 paged to the MD on call.  No POC CBG obtained as the glucose was obtained from lab and was 133.  Will do a POC CBG at 0000.

## 2020-05-19 NOTE — Progress Notes (Signed)
Pt's mother Peter Congo notifed about pt's transfer to [facility]

## 2020-05-19 NOTE — Progress Notes (Signed)
RT note. Pt. Refused BIPAP tonight. Pt. Resting comfortably. Rt will continue to monitor.

## 2020-05-19 NOTE — Progress Notes (Signed)
PROGRESS NOTE    Evan Chavez  QMG:[id number] DOB: 11/02/1960 DOA: 05/10/2020 PCP: Rosita Fire, MD  Outpatient Specialists:   Brief Narrative: As per prior documentation "59 year old man with Barrett's esophagus and esophageal stricture admitted with bibasilar pneumonia and loculated effusions.  Similar admission for bibasal pna 08/2018 Developed abdominal pain and found left hydronephrosis with ureteral obstruction   He was hospitalized November 2019 with bibasal pneumonia and imaging showed esophageal stricture and impaired esophageal motility. EGD confirmed severe reflux esophagitis with peptic ulcer disease with extensive gastric erosions and duodenal ulcer.  Biopsy revealed Barrett's esophagus. He had a repeat EGD 12/2018 with balloon dilatation for peptic stricture, his dysphagia improved and surveillance EGD was advised".  SIGNIFICANT EVENTS  8/2 US guided LT thora >> apical pneumothorax 8/4 R chest tube placement, transfer to [facility] for CTS eval  STUDIES:   EGD 12/2018 esophageal peptic  Stricture , dilated , Barrett'sesophagus   CT angio 08/2018 >> bibasal pneumonia Esophagram 08/2018 >> esophagitis. Focal area of relative narrowing of the esophagus is identified estimated over 5 cm length  Diffusely impaired esophageal motility.  Laryngeal penetration without gross aspiration  Ct angio chest 7/29 >>Bilateral pleural effusions and lower lobe consolidation  Diffuse wall thickening throughout the esophagus -stable from a prior exam of 2019  CT abd/pelvis W con 8/1 >> worsening of LEFT lower lobe airspace disease that may represent a combination of volume loss and pneumonitis Loculated bilateral basilar effusions with similar appearance Ct chest 8/4: 1. Overall findings consistent with esophageal perforation arising from the right lateral aspect of the mid to distal esophagus as detailed above. There is extraluminal oral contrast tracking from this esophageal  wall defect into the right pleural space. 2. Large right-sided hydropneumothorax with significant interval worsening since the prior study despite right-sided chest tube placement. Additionally, the fluid component appears more complex. The right-sided chest tube appears to enter the thoracic cavity inferiorly and courses along the right hemidiaphragm and terminates in the region of the right epicardial fat pad. The tube is kinked at its tip. While the tube is likely intrathoracic, it will need significant repositioning to be effective. 3. Findings consistent with interval aspiration with complete occlusion of bronchus intermedius as well as the right middle and right lower lobe bronchi. There is likely developing aspiration pneumonia within the collapsed right middle and right lower lobes as well as the posterior left upper and left lower lobes. There is a small left-sided pleural effusion. 4. Persistent diffuse esophageal wall thickening of unknown clinical significance. Correlation with endoscopy is recommended as an underlying mass is not excluded. 5. Again noted is a left-sided thyroid nodule measuring approximately 2.4 cm. Outpatient thyroid ultrasound follow-up is recommended.(Ref: J Am Coll Radiol. 2015 Feb;12(2): 143-50). Echo 7/30: lvef 65% with grade I diastolic dysfunction.  05/19/2020: Patient seen.  Available records reviewed.  Input from cardiothoracic team is highly appreciated.  Patient has been transferred to [facility].  Patient looks stable.  No new complaints.  Patient seems to have a bad memory..   Assessment & Plan:   Active Problems:   Essential hypertension   Atrial tachycardia (HCC)   Dyslipidemia   CAP (community acquired pneumonia)   Lobar pneumonia ([place])   Sepsis due to undetermined organism ([place])   Gastroesophageal reflux disease with esophagitis   Shortness of breath   Thyroid nodule   BPH (benign prostatic hyperplasia)   Loculated pleural effusion    Ureteral stone with hydronephrosis   Pneumothorax on right  Acute respiratory failure with hypoxia (HCC)   Chronic bilateral pleural effusions   Parapneumonic effusion   Malnutrition of moderate degree   Esophageal perforation  Acute respiratory failure with hypoxia Bibasilar pneumonia - -Patient was admitted to the ICU team and manage for possible aspiration pneumonia. -Longstanding history of reflux esophagitis and esophageal stricture (although patient cleared swallow evaluation) -Patient is currently on IV Zosyn.   ?  Underlying COPD -outpt f/u when improved for pft  Right apical pneumothorax post thoracentesis, stable -cont zosyn and fluconazole -Cardiothoracic team is following patient. -Continue to monitor closely.  Sepsis 2/2 aspiration pneumonia and esophageal rupture/RT complex loculated parapneumonic effusion, freely flowing effusion on left:  -Sepsis physiology has resolved. -Complete course of antibiotics.   -Patient underwent RIGHT VIDEO ASSISTED THORACOSCOPY (VATS)/DECORTICATION (Right) ESOPHAGEAL STENT PLACEMENT AND  PEG TUBE PLACEMENT (N/A) ESOPHAGOGASTRODUODENOSCOPY   Left hydronephrosis and needs ESWL:  -Lithotripsy has been scheduled on 8/10 -Urology, Dr Louis Meckel saw patient at [facility] and arranged outpatient lithotripsy. -Patient will follow up with Urology on discharge.   HFpEF:  Chronic and stable  DVT prophylaxis: Subcutaneous Lovenox 40 mg daily. Code Status: Full code. Family Communication:  Disposition Plan: To depend on hospital course.  Consultants:   Cardiothoracic team.  Procedures:  RIGHT VIDEO ASSISTED THORACOSCOPY (VATS)/DECORTICATION (Right) ESOPHAGEAL STENT PLACEMENT AND  PEG TUBE PLACEMENT (N/A) ESOPHAGOGASTRODUODENOSCOPY   Antimicrobials:   IV Zosyn started on 05/15/2020.  IV Diflucan started on 05/17/2020   Subjective: -No new complaints. -No fever or chills. -No chest pain. -No cough. -Patient seems to have a  poor memory.  Objective: Vitals:   05/19/20 0721 05/19/20 0800 05/19/20 1000 05/19/20 1119  BP:  (!) 167/90 (!) 159/85   Pulse:  (!) 104 82   Resp:  (!) 21 (!) 24   Temp: 98.8 F (37.1 C)   98.6 F (37 C)  TempSrc: Oral   Oral  SpO2:  99% 95%   Weight:      Height:        Intake/Output Summary (Last 24 hours) at 05/19/2020 1434 Last data filed at 05/19/2020 0900 Gross per 24 hour  Intake 2219.04 ml  Output 1450 ml  Net 769.04 ml   Filed Weights   05/16/20 0500 05/16/20 2200 05/17/20 1425  Weight: 65.8 kg 63.6 kg 63.6 kg    Examination:  General exam: Appears calm and comfortable  Respiratory system: Clear to auscultation.  Cardiovascular system: S1 & S2  Gastrointestinal system: Abdomen is nondistended, soft and nontender. No organomegaly or masses felt. Normal bowel sounds heard. Central nervous system: Awake and alert.  Memory seems impaired.  Patient seems to move all extremities  Extremities: Leg edema.  Data Reviewed: I have personally reviewed following labs and imaging studies  CBC: Recent Labs  Lab 05/14/20 0604 05/14/20 0604 05/15/20 0331 05/16/20 0519 05/17/20 2036 05/18/20 0341 05/19/20 0129  WBC 12.7*  --  16.0* 15.7*  --  24.3* 17.9*  HGB 9.1*   < > 9.2* 8.5* 9.9* 7.8* 7.1*  HCT 29.5*   < > 29.7* 27.6* 29.0* 25.2* 22.7*  MCV 88.1  --  89.5 87.3  --  86.9 86.3  PLT 287  --  354 428*  --  469* 491*   < > = values in this interval not displayed.   Basic Metabolic Panel: Recent Labs  Lab 05/14/20 0604 05/14/20 0604 05/15/20 0331 05/16/20 0519 05/17/20 2036 05/18/20 0341 05/19/20 0129  NA 137   < > 136 137 142 142 146*  K 3.7   < > 3.8 3.7 4.0 4.1 3.1*  CL 102  --  101 99  --  106 111  CO2 25  --  24 25  --  24 23  GLUCOSE 128*  --  114* 121*  --  139* 166*  BUN 9  --  10 11  --  10 17  CREATININE 0.84  --  1.02 0.91  --  0.95 0.92  CALCIUM 8.5*  --  8.3* 8.6*  --  7.9* 7.8*   < > = values in this interval not displayed.    GFR: Estimated Creatinine Clearance: 77.8 mL/min (by C-G formula based on SCr of 0.92 mg/dL). Liver Function Tests: No results for input(s): AST, ALT, ALKPHOS, BILITOT, PROT, ALBUMIN in the last 168 hours. No results for input(s): LIPASE, AMYLASE in the last 168 hours. No results for input(s): AMMONIA in the last 168 hours. Coagulation Profile: No results for input(s): INR, PROTIME in the last 168 hours. Cardiac Enzymes: No results for input(s): CKTOTAL, CKMB, CKMBINDEX, TROPONINI in the last 168 hours. BNP (last 3 results) No results for input(s): PROBNP in the last 8760 hours. HbA1C: No results for input(s): HGBA1C in the last 72 hours. CBG: Recent Labs  Lab 05/18/20 1940 05/19/20 0059 05/19/20 0439 05/19/20 0719 05/19/20 1118  GLUCAP 146* 147* 121* 122* 126*   Lipid Profile: No results for input(s): CHOL, HDL, LDLCALC, TRIG, CHOLHDL, LDLDIRECT in the last 72 hours. Thyroid Function Tests: No results for input(s): TSH, T4TOTAL, FREET4, T3FREE, THYROIDAB in the last 72 hours. Anemia Panel: No results for input(s): VITAMINB12, FOLATE, FERRITIN, TIBC, IRON, RETICCTPCT in the last 72 hours. Urine analysis:    Component Value Date/Time   COLORURINE YELLOW 05/11/2020 0910   APPEARANCEUR CLEAR 05/11/2020 0910   LABSPEC 1.035 (H) 05/11/2020 0910   PHURINE 7.0 05/11/2020 0910   GLUCOSEU NEGATIVE 05/11/2020 0910   HGBUR NEGATIVE 05/11/2020 0910   BILIRUBINUR NEGATIVE 05/11/2020 0910   BILIRUBINUR NEG 09/22/2018 1527   KETONESUR NEGATIVE 05/11/2020 0910   PROTEINUR 30 (A) 05/11/2020 0910   UROBILINOGEN 0.2 09/22/2018 1527   UROBILINOGEN 0.2 03/03/2012 0205   NITRITE NEGATIVE 05/11/2020 0910   LEUKOCYTESUR NEGATIVE 05/11/2020 0910   Sepsis Labs: @LABRCNTIP (procalcitonin:4,lacticidven:4)  ) Recent Results (from the past 240 hour(s))  SARS Coronavirus 2 by RT PCR (hospital order, performed in [facility] hospital lab) Nasopharyngeal Nasopharyngeal Swab     Status: None    Collection Time: 05/09/20  4:08 PM   Specimen: Nasopharyngeal Swab  Result Value Ref Range Status   SARS Coronavirus 2 NEGATIVE NEGATIVE Final    Comment: (NOTE) SARS-CoV-2 target nucleic acids are NOT DETECTED.  The SARS-CoV-2 RNA is generally detectable in upper and lower respiratory specimens during the acute phase of infection. The lowest concentration of SARS-CoV-2 viral copies this assay can detect is 250 copies / mL. A negative result does not preclude SARS-CoV-2 infection and should not be used as the sole basis for treatment or other patient management decisions.  A negative result may occur with improper specimen collection / handling, submission of specimen other than nasopharyngeal swab, presence of viral mutation(s) within the areas targeted by this assay, and inadequate number of viral copies (<250 copies / mL). A negative result must be combined with clinical observations, patient history, and epidemiological information.  Fact Sheet for Patients:   StrictlyIdeas.no  Fact Sheet for Healthcare Providers: BankingDealers.co.za  This test is not yet approved or  cleared by the [place] FDA  and has been authorized for detection and/or diagnosis of SARS-CoV-2 by FDA under an Emergency Use Authorization (EUA).  This EUA will remain in effect (meaning this test can be used) for the duration of the COVID-19 declaration under Section 564(b)(1) of the Act, 21 U.S.C. section 360bbb-3(b)(1), unless the authorization is terminated or revoked sooner.  Performed at [facility], [address], [place], [place] [address]   Blood Culture (routine x 2)     Status: None   Collection Time: 05/10/20  9:11 PM   Specimen: BLOOD LEFT ARM  Result Value Ref Range Status   Specimen Description BLOOD LEFT ARM  Final   Special Requests   Final    BOTTLES DRAWN AEROBIC AND ANAEROBIC Blood Culture adequate volume   Culture   Final    NO  GROWTH 5 DAYS Performed at [facility], [address] [place], [place] [address]    Report Status 05/15/2020 FINAL  Final  Blood Culture (routine x 2)     Status: None   Collection Time: 05/10/20 10:32 PM   Specimen: BLOOD  Result Value Ref Range Status   Specimen Description BLOOD LEFT ANTECUBITAL  Final   Special Requests   Final    BOTTLES DRAWN AEROBIC AND ANAEROBIC Blood Culture adequate volume   Culture   Final    NO GROWTH 5 DAYS Performed at [facility], [address], [place], [place] [address]    Report Status 05/15/2020 FINAL  Final  SARS Coronavirus 2 by RT PCR (hospital order, performed in [facility] hospital lab) Nasopharyngeal Nasopharyngeal Swab     Status: None   Collection Time: 05/10/20 11:58 PM   Specimen: Nasopharyngeal Swab  Result Value Ref Range Status   SARS Coronavirus 2 NEGATIVE NEGATIVE Final    Comment: (NOTE) SARS-CoV-2 target nucleic acids are NOT DETECTED.  The SARS-CoV-2 RNA is generally detectable in upper and lower respiratory specimens during the acute phase of infection. The lowest concentration of SARS-CoV-2 viral copies this assay can detect is 250 copies / mL. A negative result does not preclude SARS-CoV-2 infection and should not be used as the sole basis for treatment or other patient management decisions.  A negative result may occur with improper specimen collection / handling, submission of specimen other than nasopharyngeal swab, presence of viral mutation(s) within the areas targeted by this assay, and inadequate number of viral copies (<250 copies / mL). A negative result must be combined with clinical observations, patient history, and epidemiological information.  Fact Sheet for Patients:   StrictlyIdeas.no  Fact Sheet for Healthcare Providers: BankingDealers.co.za  This test is not yet approved or  cleared by the [place] FDA and has been authorized for detection and/or  diagnosis of SARS-CoV-2 by FDA under an Emergency Use Authorization (EUA).  This EUA will remain in effect (meaning this test can be used) for the duration of the COVID-19 declaration under Section 564(b)(1) of the Act, 21 U.S.C. section 360bbb-3(b)(1), unless the authorization is terminated or revoked sooner.  Performed at [facility], [address], [place], [place] [address]   Urine culture     Status: None   Collection Time: 05/11/20  9:10 AM   Specimen: Urine, Clean Catch  Result Value Ref Range Status   Specimen Description   Final    URINE, CLEAN CATCH Performed at [facility], [address] [place], [place] [address]    Special Requests   Final    NONE Performed at [facility] [place]  [address], [place], [place] [address]    Culture   Final    NO GROWTH Performed at [facility], [place] [address], [place], [place] [address]    Report Status 05/12/2020 FINAL  Final  Culture, body fluid-bottle     Status: None   Collection Time: 05/14/20  3:15 PM   Specimen: Pleura  Result Value Ref Range Status   Specimen Description PLEURAL  Final   Special Requests NONE  Final   Culture   Final    NO GROWTH 5 DAYS Performed at [facility], [address] [place], [place] [address]    Report Status 05/19/2020 FINAL  Final  Gram stain     Status: None   Collection Time: 05/14/20  3:15 PM   Specimen: Pleura  Result Value Ref Range Status   Specimen Description PLEURAL  Final   Special Requests NONE  Final   Gram Stain   Final    WBC SEEN WBC PRESENT,BOTH PMN AND MONONUCLEAR NO ORGANISMS SEEN CYTOSPIN SMEAR PERFORMED AT [facility] Performed at [facility], [address], [place], [place] [address]    Report Status 05/14/2020 FINAL  Final  MRSA PCR Screening     Status: None   Collection Time: 05/16/20  9:52 PM   Specimen: Nasopharyngeal  Result Value Ref Range Status   MRSA by PCR NEGATIVE NEGATIVE Final    Comment:        The GeneXpert MRSA Assay (FDA approved for NASAL  specimens only), is one component of a comprehensive MRSA colonization surveillance program. It is not intended to diagnose MRSA infection nor to guide or monitor treatment for MRSA infections. Performed at [facility], [place] [address], [place], [place] [address]   Aerobic/Anaerobic Culture (surgical/deep wound)     Status: None (Preliminary result)   Collection Time: 05/17/20  4:54 PM   Specimen: PATH Other; Tissue  Result Value Ref Range Status   Specimen Description TISSUE EMPYEMA RIGHT CHEST  Final   Special Requests NONE  Final   Gram Stain NO WBC SEEN NO ORGANISMS SEEN   Final   Culture   Final    NO GROWTH 2 DAYS NO ANAEROBES ISOLATED; CULTURE IN PROGRESS FOR 5 DAYS Performed at [facility], [place] [address], [place], [place] [address]    Report Status PENDING  Incomplete         Radiology Studies: DG Chest 1 View  Result Date: 05/17/2020 CLINICAL DATA:  Surgery. Esophageal stent placement. EXAM: DG C-ARM 1-60 MIN; CHEST  1 VIEW FLUOROSCOPY TIME:  Fluoroscopy Time:  5 minutes 48 seconds Radiation Exposure Index (if provided by the fluoroscopic device): 59.06 mGy Number of Acquired Spot Images: 3 COMPARISON:  Preprocedural CT yesterday. FINDINGS: Three fluoroscopic spot views obtained in the operating room. Endoscope is in place. A stent overlies the endoscope with tip projecting over the esophageal diaphragmatic hiatus. Subsequent enteric tube courses in the stent. There is contrast opacifying the distal stent on image 1. Right chest tube is in place. IMPRESSION: Intraoperative fluoroscopy during esophageal stent placement. Please reference procedural report for details. Electronically Signed   By: Keith Rake M.D.   On: 05/17/2020 19:55   DG CHEST PORT 1 VIEW  Result Date: 05/19/2020 CLINICAL DATA:  Emp[place]ema EXAM: PORTABLE CHEST 1 VIEW COMPARISON:  05/18/2020 FINDINGS: Two right apical chest tubes.  No pneumothorax is seen. Patchy right mid/lower lung  opacities, reflecting a combination of pneumonia and known complex right pleural fluid collection/empyema. Mild  patchy opacity in the left mid/lower lung, likely reflecting pneumonia. Stable esophageal stent. Cardiomegaly. IMPRESSION: Two right apical chest tubes.  No pneumothorax is seen. Multifocal pneumonia, grossly unchanged. Superimposed complex right pleural fluid collection/empyema. Esophageal stent. Electronically Signed   By: Julian Hy M.D.   On: 05/19/2020 07:12   DG CHEST PORT 1 VIEW  Result Date: 05/18/2020 CLINICAL DATA:  Esophageal perforation. EXAM: PORTABLE CHEST 1 VIEW COMPARISON:  05/17/2020. FINDINGS: Esophageal stent stable position. Two right chest tubes in stable position. Stable cardiomegaly. No pulmonary venous congestion. Low lung volumes with bibasilar atelectasis. Bilateral pulmonary infiltrates/edema again noted. Interim slight increase in right-sided pleural effusion. No pneumothorax noted on today's exam. IMPRESSION: 1. Esophageal stent and 2 right chest tubes noted in stable position. No pneumothorax noted on today's exam. Interim slight increase in right pleural effusion. 2. Low lung volumes with bibasilar atelectasis. Persistent bilateral pulmonary infiltrates/edema without interim change. 3.  Stable cardiomegaly.  No pulmonary venous congestion Electronically Signed   By: Marcello Moores  Register   On: 05/18/2020 05:41   DG Chest Port 1 View  Result Date: 05/17/2020 CLINICAL DATA:  Esophageal stent placement. EXAM: PORTABLE CHEST 1 VIEW COMPARISON:  Chest CT yesterday. FINDINGS: Interval placement of esophageal stent. Two right chest tubes in place. Decreased right hydropneumothorax. Persistent pleural fluid in extrapleural air at the right lung base. Patchy opacity in the right mid lower lung zone. Small left pleural effusion and basilar opacities. Heart size grossly stable. There is contrast within the stomach. IMPRESSION: 1. Interval placement of esophageal stent. 2. Two  right chest tubes in place with decreased right hydropneumothorax. Small volume of residual pleural fluid in extrapleural air at the right lung base. 3. Patchy opacity in the right mid and lower lung zone, may represent atelectasis or re-expansion pulmonary edema. 4. Small left pleural effusion and basilar opacities. Electronically Signed   By: Keith Rake M.D.   On: 05/17/2020 19:57   DG C-Arm 1-60 Min  Result Date: 05/17/2020 CLINICAL DATA:  Surgery. Esophageal stent placement. EXAM: DG C-ARM 1-60 MIN; CHEST  1 VIEW FLUOROSCOPY TIME:  Fluoroscopy Time:  5 minutes 48 seconds Radiation Exposure Index (if provided by the fluoroscopic device): 59.06 mGy Number of Acquired Spot Images: 3 COMPARISON:  Preprocedural CT yesterday. FINDINGS: Three fluoroscopic spot views obtained in the operating room. Endoscope is in place. A stent overlies the endoscope with tip projecting over the esophageal diaphragmatic hiatus. Subsequent enteric tube courses in the stent. There is contrast opacifying the distal stent on image 1. Right chest tube is in place. IMPRESSION: Intraoperative fluoroscopy during esophageal stent placement. Please reference procedural report for details. Electronically Signed   By: Keith Rake M.D.   On: 05/17/2020 19:55        Scheduled Meds: . acetaminophen (TYLENOL) oral liquid 160 mg/5 mL  1,000 mg Per Tube Q6H   Or  . acetaminophen  1,000 mg Oral Q6H  . bisacodyl  10 mg Oral Daily  . chlorhexidine  15 mL Mouth Rinse BID  . Chlorhexidine Gluconate Cloth  6 each Topical Daily  . enoxaparin (LOVENOX) injection  40 mg Subcutaneous Daily  . feeding supplement (PROSource TF)  45 mL Per Tube BID  . free water  200 mL Per Tube Q3H  . mouth rinse  15 mL Mouth Rinse q12n4p  . metoprolol tartrate  5 mg Intravenous Q6H  . sennosides  5 mL Per Tube QHS   Continuous Infusions: . sodium chloride 70 mL/hr at 05/16/20 2122  .  sodium chloride    . feeding supplement (OSMOLITE 1.5 CAL)  1,000 mL (05/18/20 1739)  . fluconazole (DIFLUCAN) IV 400 mg (05/19/20 0957)  . piperacillin-tazobactam (ZOSYN)  IV 12.5 mL/hr at 05/19/20 0900     LOS: 8 days    Time spent: 35 minutes.   Dana Allan, MD  [facility] Pager #: [phone] 7PM-7AM contact night coverage as above

## 2020-05-20 ENCOUNTER — Inpatient Hospital Stay (HOSPITAL_COMMUNITY): Payer: Medicaid Other

## 2020-05-20 DIAGNOSIS — L8990 Pressure ulcer of unspecified site, unspecified stage: Secondary | ICD-10-CM | POA: Diagnosis not present

## 2020-05-20 LAB — CBC WITH DIFFERENTIAL/PLATELET
Abs Immature Granulocytes: 0.12 10*3/uL — ABNORMAL HIGH (ref 0.00–0.07)
Basophils Absolute: 0 10*3/uL (ref 0.0–0.1)
Basophils Relative: 0 %
Eosinophils Absolute: 0.1 10*3/uL (ref 0.0–0.5)
Eosinophils Relative: 1 %
HCT: 23.5 % — ABNORMAL LOW (ref 39.0–52.0)
Hemoglobin: 7 g/dL — ABNORMAL LOW (ref 13.0–17.0)
Immature Granulocytes: 1 %
Lymphocytes Relative: 8 %
Lymphs Abs: 1 10*3/uL (ref 0.7–4.0)
MCH: 26 pg (ref 26.0–34.0)
MCHC: 29.8 g/dL — ABNORMAL LOW (ref 30.0–36.0)
MCV: 87.4 fL (ref 80.0–100.0)
Monocytes Absolute: 0.6 10*3/uL (ref 0.1–1.0)
Monocytes Relative: 5 %
Neutro Abs: 10.4 10*3/uL — ABNORMAL HIGH (ref 1.7–7.7)
Neutrophils Relative %: 85 %
Platelets: 489 10*3/uL — ABNORMAL HIGH (ref 150–400)
RBC: 2.69 MIL/uL — ABNORMAL LOW (ref 4.22–5.81)
RDW: 14.1 % (ref 11.5–15.5)
WBC: 12.3 10*3/uL — ABNORMAL HIGH (ref 4.0–10.5)
nRBC: 0.6 % — ABNORMAL HIGH (ref 0.0–0.2)

## 2020-05-20 LAB — RENAL FUNCTION PANEL
Albumin: 1.9 g/dL — ABNORMAL LOW (ref 3.5–5.0)
Anion gap: 12 (ref 5–15)
BUN: 10 mg/dL (ref 6–20)
CO2: 22 mmol/L (ref 22–32)
Calcium: 8.2 mg/dL — ABNORMAL LOW (ref 8.9–10.3)
Chloride: 110 mmol/L (ref 98–111)
Creatinine, Ser: 0.9 mg/dL (ref 0.61–1.24)
GFR calc Af Amer: >60 mL/min (ref >60)
GFR calc non Af Amer: >60 mL/min (ref >60)
Glucose, Bld: 139 mg/dL — ABNORMAL HIGH (ref 70–99)
Phosphorus: 3.3 mg/dL (ref 2.5–4.6)
Potassium: 4.5 mmol/L (ref 3.5–5.1)
Sodium: 144 mmol/L (ref 135–145)

## 2020-05-20 LAB — MAGNESIUM: Magnesium: 1.7 mg/dL (ref 1.7–2.4)

## 2020-05-20 LAB — GLUCOSE, CAPILLARY
Glucose-Capillary: 120 mg/dL — ABNORMAL HIGH (ref 70–99)
Glucose-Capillary: 128 mg/dL — ABNORMAL HIGH (ref 70–99)
Glucose-Capillary: 149 mg/dL — ABNORMAL HIGH (ref 70–99)
Glucose-Capillary: 95 mg/dL (ref 70–99)
Glucose-Capillary: 130 mg/dL — ABNORMAL HIGH (ref 70–99)

## 2020-05-20 MED ORDER — LORAZEPAM 2 MG/ML IJ SOLN
0.5000 mg | Freq: Once | INTRAMUSCULAR | Status: AC | PRN
  Administered 2020-05-20: 0.5 mg via INTRAVENOUS
  Filled 2020-05-20: qty 1

## 2020-05-20 MED ORDER — HALOPERIDOL LACTATE 5 MG/ML IJ SOLN: 1.0000 mg | Freq: Four times a day (QID) | INTRAMUSCULAR | Status: DC | PRN

## 2020-05-20 NOTE — Progress Notes (Signed)
   05/20/20 0910  Assess: MEWS Score  Temp 99.2 F (37.3 C)  BP (!) 145/96  Pulse Rate (!) 105  ECG Heart Rate (!) 115  Resp (!) 28  Level of Consciousness Alert  SpO2 98 %  O2 Device Room Air  Assess: MEWS Score  MEWS Temp 0  MEWS Systolic 0  MEWS Pulse 2  MEWS RR 2  MEWS LOC 0  MEWS Score 4  MEWS Score Color Red  Assess: if the MEWS score is Yellow or Red  Were vital signs taken at a resting state? No  Focused Assessment Change from prior assessment (see assessment flowsheet)  Early Detection of Sepsis Score *See Row Information* Medium  MEWS guidelines implemented *See Row Information* Yes  Treat  MEWS Interventions Administered scheduled meds/treatments;Administered prn meds/treatments;Other (Comment) (notified CTS MD)  Pain Scale 0-10  Pain Score 7  Pain Type Acute pain  Pain Intervention(s) Medication (See eMAR)  Complains of Anxiety;Restless  Patients response to intervention Unchanged  Take Vital Signs  Increase Vital Sign Frequency  Red: Q 1hr X 4 then Q 4hr X 4, if remains red, continue Q 4hrs  Escalate  MEWS: Escalate Red: discuss with charge nurse/RN and provider, consider discussing with RRT  Notify: Charge Nurse/RN  Name of Charge Nurse/RN Notified Chrissy RN  Date Charge Nurse/RN Notified 05/20/20  Time Charge Nurse/RN Notified 0845  Notify: Provider  Provider Name/Title Dr. Roxan Hockey  Date Provider Notified 05/20/20  Time Provider Notified [phone]  Notification Type Call  Notification Reason Change in status  Response See new orders  Date of Provider Response 05/20/20  Time of Provider Response [phone]  Document  Patient Outcome Other (Comment) (awaiting cxr result ordered by MD)  Progress note created (see row info) Yes

## 2020-05-20 NOTE — Progress Notes (Signed)
3 Days Post-Op Procedure(s) (LRB): RIGHT VIDEO ASSISTED THORACOSCOPY (VATS)/DECORTICATION (Right) ESOPHAGEAL STENT PLACEMENT AND  PEG TUBE PLACEMENT (N/A) ESOPHAGOGASTRODUODENOSCOPY (EGD) (N/A) Subjective: Was tachypneic and tachycardic earlier. Now sleeping comfortably  Objective: Vital signs in last 24 hours: Temp:  [98.3 F (36.8 C)-99.3 F (37.4 C)] 99 F (37.2 C) (08/08 1010) Pulse Rate:  [55-105] 98 (08/08 1010) Cardiac Rhythm: Sinus tachycardia (08/08 0928) Resp:  [18-33] 28 (08/08 1010) BP: (136-182)/(74-98) 136/74 (08/08 1010) SpO2:  [85 %-98 %] 98 % (08/08 0910)  Hemodynamic parameters for last 24 hours:    Intake/Output from previous day: 08/07 0701 - 08/08 0700 In: 1557.2 [I.V.:450.2; NG/GT:640; IV Piggyback:467] Out: 2045 [Urine:1800; Drains:205; Chest Tube:40] Intake/Output this shift: No intake/output data recorded.  General appearance: cooperative and no distress Neurologic: intact Heart: regular rate and rhythm Lungs: diminished breath sounds slightly diminished on right  Lab Results: Recent Labs    05/19/20 0129 05/20/20 0524  WBC 17.9* 12.3*  HGB 7.1* 7.0*  HCT 22.7* 23.5*  PLT 491* 489*   BMET:  Recent Labs    05/19/20 2026 05/20/20 0524  NA 147* 144  K 3.6 4.5  CL 114* 110  CO2 25 22  GLUCOSE 133* 139*  BUN 11 10  CREATININE 0.85 0.90  CALCIUM 8.0* 8.2*    PT/INR: No results for input(s): LABPROT, INR in the last 72 hours. ABG    Component Value Date/Time   PHART 7.402 05/17/2020 2036   HCO3 26.2 05/17/2020 2036   TCO2 27 05/17/2020 2036   O2SAT 94.0 05/17/2020 2036   CBG (last 3)  Recent Labs    05/19/20 2356 05/20/20 0440 05/20/20 0823  GLUCAP 106* 120* 128*    Assessment/Plan: S/P Procedure(s) (LRB): RIGHT VIDEO ASSISTED THORACOSCOPY (VATS)/DECORTICATION (Right) ESOPHAGEAL STENT PLACEMENT AND  PEG TUBE PLACEMENT (N/A) ESOPHAGOGASTRODUODENOSCOPY (EGD) (N/A) -Some shortness of breath and tachycardia earlier this  AM Now in NSR with unlabored breathing CXR unchanged Continue with Ct, NPO, IV antibiotics   LOS: 9 days    Melrose Nakayama 05/20/2020

## 2020-05-20 NOTE — Progress Notes (Signed)
PROGRESS NOTE    Evan Chavez  URK:[id number] DOB: 1961-01-26 DOA: 05/10/2020 PCP: Rosita Fire, MD  Outpatient Specialists:   Brief Narrative: Patient is a 59 year old male with past medical history significant for  Barrett's esophagus, reflux esophagitis, peptic ulcer disease with extensive gastric erosion, duodenal ulcer and esophageal stricture.  Patient has had balloon dilated patient for peptic stricture and dysphagia.  Patient was initially cared for by the critical care team and cardiothoracic surgery team.  Patient has been managed for bibasilar pneumonia, loculated effusion, esophageal rupture and right apical pneumothorax.  Patient was transferred to the hospitalist team yesterday.  Patient remains on IV antibiotics.  Cardiothoracic team is still following patient.  Patient has undergone RIGHT VIDEO ASSISTED THORACOSCOPY (VATS)/DECORTICATION (Right) ESOPHAGEAL STENT PLACEMENT AND  PEG TUBE PLACEMENT (N/A) ESOPHAGOGASTRODUODENOSCOPY.  05/20/2020: Reported to be agitated earlier today, and more confused last evening.  Patient settled with adequate pain control.  We will also prescribe low-dose haloperidol for likely delirium.  SIGNIFICANT EVENTS  8/2 US guided LT thora >> apical pneumothorax 8/4 R chest tube placement, transfer to [facility] for CTS eval  STUDIES:   EGD 12/2018 esophageal peptic  Stricture , dilated , Barrett'sesophagus   CT angio 08/2018 >> bibasal pneumonia Esophagram 08/2018 >> esophagitis. Focal area of relative narrowing of the esophagus is identified estimated over 5 cm length  Diffusely impaired esophageal motility.  Laryngeal penetration without gross aspiration  Ct angio chest 7/29 >>Bilateral pleural effusions and lower lobe consolidation  Diffuse wall thickening throughout the esophagus -stable from a prior exam of 2019  CT abd/pelvis W con 8/1 >> worsening of LEFT lower lobe airspace disease that may represent a combination of volume loss and  pneumonitis Loculated bilateral basilar effusions with similar appearance Ct chest 8/4: 1. Overall findings consistent with esophageal perforation arising from the right lateral aspect of the mid to distal esophagus as detailed above. There is extraluminal oral contrast tracking from this esophageal wall defect into the right pleural space. 2. Large right-sided hydropneumothorax with significant interval worsening since the prior study despite right-sided chest tube placement. Additionally, the fluid component appears more complex. The right-sided chest tube appears to enter the thoracic cavity inferiorly and courses along the right hemidiaphragm and terminates in the region of the right epicardial fat pad. The tube is kinked at its tip. While the tube is likely intrathoracic, it will need significant repositioning to be effective. 3. Findings consistent with interval aspiration with complete occlusion of bronchus intermedius as well as the right middle and right lower lobe bronchi. There is likely developing aspiration pneumonia within the collapsed right middle and right lower lobes as well as the posterior left upper and left lower lobes. There is a small left-sided pleural effusion. 4. Persistent diffuse esophageal wall thickening of unknown clinical significance. Correlation with endoscopy is recommended as an underlying mass is not excluded. 5. Again noted is a left-sided thyroid nodule measuring approximately 2.4 cm. Outpatient thyroid ultrasound follow-up is recommended.(Ref: J Am Coll Radiol. 2015 Feb;12(2): 143-50). Echo 7/30: lvef 65% with grade I diastolic dysfunction.  05/19/2020: Patient seen.  Available records reviewed.  Input from cardiothoracic team is highly appreciated.  Patient has been transferred to [facility].  Patient looks stable.  No new complaints.  Patient seems to have a bad memory..   Assessment & Plan:   Active Problems:   Essential hypertension   Atrial  tachycardia (HCC)   Dyslipidemia   CAP (community acquired pneumonia)   Lobar pneumonia ([place])  Sepsis due to undetermined organism ([place])   Gastroesophageal reflux disease with esophagitis   Shortness of breath   Thyroid nodule   BPH (benign prostatic hyperplasia)   Loculated pleural effusion   Ureteral stone with hydronephrosis   Pneumothorax on right   Acute respiratory failure with hypoxia (HCC)   Chronic bilateral pleural effusions   Parapneumonic effusion   Malnutrition of moderate degree   Esophageal perforation   Pressure injury of skin  Acute respiratory failure with hypoxia Bibasilar pneumonia - -Patient was admitted to the ICU team and managed for possible aspiration pneumonia. -Longstanding history of reflux esophagitis and esophageal stricture (although patient cleared swallow evaluation) -Patient is currently on IV Zosyn.  ? Underlying COPD -outpt f/u when improved for pft  Right apical pneumothorax post thoracentesis, stable -cont zosyn and fluconazole -Cardiothoracic team is following patient. -Continue to monitor closely.  Sepsis 2/2 aspiration pneumonia and esophageal rupture/RT complex loculated parapneumonic effusion, freely flowing effusion on left:  -Sepsis physiology has resolved. -Complete course of antibiotics.   -Patient underwent RIGHT VIDEO ASSISTED THORACOSCOPY (VATS)/DECORTICATION (Right) ESOPHAGEAL STENT PLACEMENT AND  PEG TUBE PLACEMENT (N/A) ESOPHAGOGASTRODUODENOSCOPY  05/20/2020: Likely, patient may have developed delirium-likely multifactorial.  Continue with adequate pain control.  Treat above acute pathology.  Low-dose haloperidol as needed.    Left hydronephrosis and needs ESWL:  -Lithotripsy has been scheduled on 8/10 (this may need to be rescheduled as patient will likely be in the hospital) -Urology, Dr Louis Meckel saw patient at [facility] and arranged outpatient lithotripsy. -Patient will follow up with Urology on discharge.   HFpEF:   Chronic and stable  DVT prophylaxis: Subcutaneous Lovenox 40 mg daily. Code Status: Full code. Family Communication:  Disposition Plan: To depend on hospital course.  Consultants:   Cardiothoracic team.  Procedures:  RIGHT VIDEO ASSISTED THORACOSCOPY (VATS)/DECORTICATION (Right) ESOPHAGEAL STENT PLACEMENT AND  PEG TUBE PLACEMENT (N/A) ESOPHAGOGASTRODUODENOSCOPY   Antimicrobials:   IV Zosyn started on 05/15/2020.  IV Diflucan started on 05/17/2020   Subjective: -No history from patient today as patient is resting quietly  Objective: Vitals:   05/20/20 1010 05/20/20 1110 05/20/20 1158 05/20/20 1210  BP: 136/74 (!) 149/65 (!) 149/73 (!) 148/73  Pulse: 98 79  81  Resp: (!) 28 (!) 26  (!) 24  Temp: 99 F (37.2 C) 99.6 F (37.6 C)  99.2 F (37.3 C)  TempSrc: Oral   Axillary  SpO2:  96%    Weight:      Height:        Intake/Output Summary (Last 24 hours) at 05/20/2020 1250 Last data filed at 05/20/2020 1130 Gross per 24 hour  Intake 1031.94 ml  Output 2130 ml  Net -1098.06 ml   Filed Weights   05/16/20 0500 05/16/20 2200 05/17/20 1425  Weight: 65.8 kg 63.6 kg 63.6 kg    Examination:  General exam: Patient is resting quietly.   Respiratory system: Clear to auscultation.  Cardiovascular system: S1 & S2  Gastrointestinal system: Abdomen is nondistended, soft and nontender. No organomegaly or masses felt. Normal bowel sounds heard. Central nervous system: Awake and alert.  Memory seems impaired.  Patient seems to move all extremities  Extremities: No leg edema.  Data Reviewed: I have personally reviewed following labs and imaging studies  CBC: Recent Labs  Lab 05/15/20 0331 05/15/20 0331 05/16/20 0519 05/17/20 2036 05/18/20 0341 05/19/20 0129 05/20/20 0524  WBC 16.0*  --  15.7*  --  24.3* 17.9* 12.3*  NEUTROABS  --   --   --   --   --   --  10.4*  HGB 9.2*   < > 8.5* 9.9* 7.8* 7.1* 7.0*  HCT 29.7*   < > 27.6* 29.0* 25.2* 22.7* 23.5*  MCV 89.5  --  87.3   --  86.9 86.3 87.4  PLT 354  --  428*  --  469* 491* 489*   < > = values in this interval not displayed.   Basic Metabolic Panel: Recent Labs  Lab 05/16/20 0519 05/16/20 0519 05/17/20 2036 05/18/20 0341 05/19/20 0129 05/19/20 2026 05/20/20 0524  NA 137   < > 142 142 146* 147* 144  K 3.7   < > 4.0 4.1 3.1* 3.6 4.5  CL 99  --   --  106 111 114* 110  CO2 25  --   --  24 23 25 22   GLUCOSE 121*  --   --  139* 166* 133* 139*  BUN 11  --   --  10 17 11 10   CREATININE 0.91  --   --  0.95 0.92 0.85 0.90  CALCIUM 8.6*  --   --  7.9* 7.8* 8.0* 8.2*  MG  --   --   --   --   --  2.0 1.7  PHOS  --   --   --   --   --  <1.0* 3.3   < > = values in this interval not displayed.   GFR: Estimated Creatinine Clearance: 79.5 mL/min (by C-G formula based on SCr of 0.9 mg/dL). Liver Function Tests: Recent Labs  Lab 05/19/20 2026 05/20/20 0524  ALBUMIN 1.8* 1.9*   No results for input(s): LIPASE, AMYLASE in the last 168 hours. No results for input(s): AMMONIA in the last 168 hours. Coagulation Profile: No results for input(s): INR, PROTIME in the last 168 hours. Cardiac Enzymes: No results for input(s): CKTOTAL, CKMB, CKMBINDEX, TROPONINI in the last 168 hours. BNP (last 3 results) No results for input(s): PROBNP in the last 8760 hours. HbA1C: No results for input(s): HGBA1C in the last 72 hours. CBG: Recent Labs  Lab 05/19/20 1637 05/19/20 2356 05/20/20 0440 05/20/20 0823 05/20/20 1220  GLUCAP 97 106* 120* 128* 149*   Lipid Profile: No results for input(s): CHOL, HDL, LDLCALC, TRIG, CHOLHDL, LDLDIRECT in the last 72 hours. Thyroid Function Tests: No results for input(s): TSH, T4TOTAL, FREET4, T3FREE, THYROIDAB in the last 72 hours. Anemia Panel: No results for input(s): VITAMINB12, FOLATE, FERRITIN, TIBC, IRON, RETICCTPCT in the last 72 hours. Urine analysis:    Component Value Date/Time   COLORURINE YELLOW 05/11/2020 0910   APPEARANCEUR CLEAR 05/11/2020 0910   LABSPEC 1.035  (H) 05/11/2020 0910   PHURINE 7.0 05/11/2020 0910   GLUCOSEU NEGATIVE 05/11/2020 0910   HGBUR NEGATIVE 05/11/2020 0910   BILIRUBINUR NEGATIVE 05/11/2020 0910   BILIRUBINUR NEG 09/22/2018 1527   KETONESUR NEGATIVE 05/11/2020 0910   PROTEINUR 30 (A) 05/11/2020 0910   UROBILINOGEN 0.2 09/22/2018 1527   UROBILINOGEN 0.2 03/03/2012 0205   NITRITE NEGATIVE 05/11/2020 0910   LEUKOCYTESUR NEGATIVE 05/11/2020 0910   Sepsis Labs: @LABRCNTIP (procalcitonin:4,lacticidven:4)  ) Recent Results (from the past 240 hour(s))  Blood Culture (routine x 2)     Status: None   Collection Time: 05/10/20  9:11 PM   Specimen: BLOOD LEFT ARM  Result Value Ref Range Status   Specimen Description BLOOD LEFT ARM  Final   Special Requests   Final    BOTTLES DRAWN AEROBIC AND ANAEROBIC Blood Culture adequate volume   Culture   Final    NO GROWTH  5 DAYS Performed at [facility], [address] [place], [place] [address]    Report Status 05/15/2020 FINAL  Final  Blood Culture (routine x 2)     Status: None   Collection Time: 05/10/20 10:32 PM   Specimen: BLOOD  Result Value Ref Range Status   Specimen Description BLOOD LEFT ANTECUBITAL  Final   Special Requests   Final    BOTTLES DRAWN AEROBIC AND ANAEROBIC Blood Culture adequate volume   Culture   Final    NO GROWTH 5 DAYS Performed at [facility], [address] [place], [place] [address]    Report Status 05/15/2020 FINAL  Final  SARS Coronavirus 2 by RT PCR (hospital order, performed in [facility] hospital lab) Nasopharyngeal Nasopharyngeal Swab     Status: None   Collection Time: 05/10/20 11:58 PM   Specimen: Nasopharyngeal Swab  Result Value Ref Range Status   SARS Coronavirus 2 NEGATIVE NEGATIVE Final    Comment: (NOTE) SARS-CoV-2 target nucleic acids are NOT DETECTED.  The SARS-CoV-2 RNA is generally detectable in upper and lower respiratory specimens during the acute phase of infection. The lowest concentration of SARS-CoV-2 viral copies  this assay can detect is 250 copies / mL. A negative result does not preclude SARS-CoV-2 infection and should not be used as the sole basis for treatment or other patient management decisions.  A negative result may occur with improper specimen collection / handling, submission of specimen other than nasopharyngeal swab, presence of viral mutation(s) within the areas targeted by this assay, and inadequate number of viral copies (<250 copies / mL). A negative result must be combined with clinical observations, patient history, and epidemiological information.  Fact Sheet for Patients:   StrictlyIdeas.no  Fact Sheet for Healthcare Providers: BankingDealers.co.za  This test is not yet approved or  cleared by the [place] FDA and has been authorized for detection and/or diagnosis of SARS-CoV-2 by FDA under an Emergency Use Authorization (EUA).  This EUA will remain in effect (meaning this test can be used) for the duration of the COVID-19 declaration under Section 564(b)(1) of the Act, 21 U.S.C. section 360bbb-3(b)(1), unless the authorization is terminated or revoked sooner.  Performed at [facility], [address], [place], [place] [address]   Urine culture     Status: None   Collection Time: 05/11/20  9:10 AM   Specimen: Urine, Clean Catch  Result Value Ref Range Status   Specimen Description   Final    URINE, CLEAN CATCH Performed at [facility], [address], [place], [place] [address]    Special Requests   Final    NONE Performed at [facility], [address] [place], [place] [address]    Culture   Final    NO GROWTH Performed at [facility], [place] [address], [place], [place] [address]    Report Status 05/12/2020 FINAL  Final  Culture, body fluid-bottle     Status: None   Collection Time: 05/14/20  3:15 PM   Specimen: Pleura  Result Value Ref Range Status   Specimen Description PLEURAL  Final   Special  Requests NONE  Final   Culture   Final    NO GROWTH 5 DAYS Performed at [facility], [address], [place],  [address]    Report Status 05/19/2020 FINAL  Final  Gram stain     Status: None   Collection Time: 05/14/20  3:15 PM   Specimen: Pleura  Result Value Ref Range Status  Specimen Description PLEURAL  Final   Special Requests NONE  Final   Gram Stain   Final    WBC SEEN WBC PRESENT,BOTH PMN AND MONONUCLEAR NO ORGANISMS SEEN CYTOSPIN SMEAR PERFORMED AT [facility] Performed at [facility], [address], [place], [place] [address]    Report Status 05/14/2020 FINAL  Final  MRSA PCR Screening     Status: None   Collection Time: 05/16/20  9:52 PM   Specimen: Nasopharyngeal  Result Value Ref Range Status   MRSA by PCR NEGATIVE NEGATIVE Final    Comment:        The GeneXpert MRSA Assay (FDA approved for NASAL specimens only), is one component of a comprehensive MRSA colonization surveillance program. It is not intended to diagnose MRSA infection nor to guide or monitor treatment for MRSA infections. Performed at [facility], [place] [address] [place], [place] [address]   Aerobic/Anaerobic Culture (surgical/deep wound)     Status: None (Preliminary result)   Collection Time: 05/17/20  4:54 PM   Specimen: PATH Other; Tissue  Result Value Ref Range Status   Specimen Description TISSUE EMPYEMA RIGHT CHEST  Final   Special Requests NONE  Final   Gram Stain   Final    NO WBC SEEN NO ORGANISMS SEEN Performed at [facility], [address] [place], [place] [address]    Culture   Final    CULTURE REINCUBATED FOR BETTER GROWTH NO ANAEROBES ISOLATED; CULTURE IN PROGRESS FOR 5 DAYS   Report Status PENDING  Incomplete  Acid Fast Smear (AFB)     Status: None   Collection Time: 05/17/20  4:54 PM   Specimen: PATH Other; Tissue  Result Value Ref Range Status   AFB Specimen Processing Comment  Final    Comment: Tissue Grinding and Digestion/Decontamination   Acid  Fast Smear Negative  Final    Comment: (NOTE) Performed At: [facility] [place], [place] [id number] Rush Farmer MD IO:[id number]    Source (AFB) TISSUE  Final    Comment: EMPYEMA RIGHT CHEST Performed at [facility], [place] [address], [place], [place] [address]          Radiology Studies: DG Chest Port 1 View  Result Date: 05/20/2020 CLINICAL DATA:  Shortness of breath. RIGHT-sided chest tube present. EXAM: PORTABLE CHEST 1 VIEW COMPARISON:  05/19/2020 FINDINGS: Patient has dual RIGHT-sided chest tubes, unchanged in position. There is no pneumothorax. There is persistent RIGHT pleural effusion and significant opacity at the RIGHT lung base which partially obscures the hemidiaphragm. Subsegmental atelectasis at the LEFT lung base. Stable appearance of esophageal stent. IMPRESSION: 1. Stable appearance of the chest. 2. No pneumothorax. 3. Persistent RIGHT basilar opacity and pleural effusion. Electronically Signed   By: Nolon Nations M.D.   On: 05/20/2020 09:51   DG CHEST PORT 1 VIEW  Result Date: 05/19/2020 CLINICAL DATA:  Empyema EXAM: PORTABLE CHEST 1 VIEW COMPARISON:  05/18/2020 FINDINGS: Two right apical chest tubes.  No pneumothorax is seen. Patchy right mid/lower lung opacities, reflecting a combination of pneumonia and known complex right pleural fluid collection/empyema. Mild patchy opacity in the left mid/lower lung, likely reflecting pneumonia. Stable esophageal stent. Cardiomegaly. IMPRESSION: Two right apical chest tubes.  No pneumothorax is seen. Multifocal pneumonia, grossly unchanged. Superimposed complex right pleural fluid collection/empyema. Esophageal stent. Electronically Signed   By: Julian Hy M.D.   On: 05/19/2020 07:12        Scheduled Meds: . acetaminophen (TYLENOL) oral  liquid 160 mg/5 mL  1,000 mg Per Tube Q6H   Or  . acetaminophen  1,000 mg Oral Q6H  . bisacodyl  10 mg Oral Daily  . chlorhexidine  15 mL Mouth  Rinse BID  . Chlorhexidine Gluconate Cloth  6 each Topical Daily  . enoxaparin (LOVENOX) injection  40 mg Subcutaneous Daily  . feeding supplement (PROSource TF)  45 mL Per Tube BID  . free water  200 mL Per Tube Q3H  . mouth rinse  15 mL Mouth Rinse q12n4p  . metoprolol tartrate  5 mg Intravenous Q6H  . sennosides  5 mL Per Tube QHS   Continuous Infusions: . sodium chloride 70 mL/hr at 05/16/20 2122  . sodium chloride    . feeding supplement (OSMOLITE 1.5 CAL) 1,000 mL (05/19/20 1545)  . fluconazole (DIFLUCAN) IV 400 mg (05/20/20 1131)  . piperacillin-tazobactam (ZOSYN)  IV 3.375 g (05/20/20 0825)     LOS: 9 days    Time spent: 35 minutes.   Dana Allan, MD  [facility] Pager #: [phone] 7PM-7AM contact night coverage as above

## 2020-05-21 ENCOUNTER — Inpatient Hospital Stay (HOSPITAL_COMMUNITY): Payer: Medicaid Other

## 2020-05-21 ENCOUNTER — Other Ambulatory Visit (HOSPITAL_COMMUNITY): Payer: Medicaid Other

## 2020-05-21 LAB — CBC WITH DIFFERENTIAL/PLATELET
Abs Immature Granulocytes: 0.08 10*3/uL — ABNORMAL HIGH (ref 0.00–0.07)
Basophils Absolute: 0 10*3/uL (ref 0.0–0.1)
Basophils Relative: 0 %
Eosinophils Absolute: 0.2 10*3/uL (ref 0.0–0.5)
Eosinophils Relative: 1 %
HCT: 25.8 % — ABNORMAL LOW (ref 39.0–52.0)
Hemoglobin: 7.8 g/dL — ABNORMAL LOW (ref 13.0–17.0)
Immature Granulocytes: 1 %
Lymphocytes Relative: 8 %
Lymphs Abs: 1 10*3/uL (ref 0.7–4.0)
MCH: 26 pg (ref 26.0–34.0)
MCHC: 30.2 g/dL (ref 30.0–36.0)
MCV: 86 fL (ref 80.0–100.0)
Monocytes Absolute: 0.6 10*3/uL (ref 0.1–1.0)
Monocytes Relative: 5 %
Neutro Abs: 11.3 10*3/uL — ABNORMAL HIGH (ref 1.7–7.7)
Neutrophils Relative %: 85 %
Platelets: 521 10*3/uL — ABNORMAL HIGH (ref 150–400)
RBC: 3 MIL/uL — ABNORMAL LOW (ref 4.22–5.81)
RDW: 14.1 % (ref 11.5–15.5)
WBC: 13.2 10*3/uL — ABNORMAL HIGH (ref 4.0–10.5)
nRBC: 0.4 % — ABNORMAL HIGH (ref 0.0–0.2)

## 2020-05-21 LAB — BASIC METABOLIC PANEL
Anion gap: 10 (ref 5–15)
BUN: 11 mg/dL (ref 6–20)
CO2: 23 mmol/L (ref 22–32)
Calcium: 8.4 mg/dL — ABNORMAL LOW (ref 8.9–10.3)
Chloride: 106 mmol/L (ref 98–111)
Creatinine, Ser: 0.73 mg/dL (ref 0.61–1.24)
GFR calc Af Amer: >60 mL/min (ref >60)
GFR calc non Af Amer: >60 mL/min (ref >60)
Glucose, Bld: 147 mg/dL — ABNORMAL HIGH (ref 70–99)
Potassium: 3.7 mmol/L (ref 3.5–5.1)
Sodium: 139 mmol/L (ref 135–145)

## 2020-05-21 LAB — SURGICAL PATHOLOGY

## 2020-05-21 LAB — MAGNESIUM: Magnesium: 1.7 mg/dL (ref 1.7–2.4)

## 2020-05-21 LAB — GLUCOSE, CAPILLARY
Glucose-Capillary: 117 mg/dL — ABNORMAL HIGH (ref 70–99)
Glucose-Capillary: 131 mg/dL — ABNORMAL HIGH (ref 70–99)
Glucose-Capillary: 136 mg/dL — ABNORMAL HIGH (ref 70–99)
Glucose-Capillary: 140 mg/dL — ABNORMAL HIGH (ref 70–99)
Glucose-Capillary: 140 mg/dL — ABNORMAL HIGH (ref 70–99)
Glucose-Capillary: 141 mg/dL — ABNORMAL HIGH (ref 70–99)

## 2020-05-21 LAB — PHOSPHORUS: Phosphorus: 3.5 mg/dL (ref 2.5–4.6)

## 2020-05-21 MED ORDER — AMLODIPINE BESYLATE 2.5 MG PO TABS
2.5000 mg | ORAL_TABLET | Freq: Every day | ORAL | Status: DC
  Administered 2020-05-21 – 2020-05-22 (×2): 2.5 mg via ORAL
  Filled 2020-05-21 (×2): qty 1

## 2020-05-21 NOTE — Progress Notes (Signed)
PROGRESS NOTE  Evan Chavez  DOB: 09-05-1961  PCP: Rosita Fire, MD UKG:[id number]  DOA: 05/10/2020  LOS: 10 days   Chief Complaint  Patient presents with   Shortness of Breath   Brief narrative: Evan Chavez is a 59 y.o. male with PMH of GERD, Barrett's esophagus, peptic ulcer disease with extensive gastric erosion, duodenal ulcer and esophageal stricture, also with history of prior stroke, hyperlipidemia, BPH, remote to cocaine abuse. He was hospitalized November 2019 with bibasal pneumonia and imaging showed esophageal stricture and impaired esophageal motility. EGD confirmed severe reflux esophagitis with peptic ulcer disease with extensive gastric erosions and duodenal ulcer.  Biopsy revealed Barrett's esophagus. He had a repeat EGD 12/2018 with balloon dilatation for peptic stricture, his dysphagia improved and surveillance EGD was advised.   He presented to the ED at [facility] on 7/28 with generalized weakness, productive cough and low-grade fever.   Chest x-ray showed bibasilar pneumonia, treated with amoxicillin and doxycycline.   He returned to the ED 7/29 due to increased shortness of breath.  Labs showed leukocytosis mild lactic acidosis.  CT angiogram of the chest showed bibasal consolidation and pleural effusions .  He was started on  ceftriaxone and azithromycin and admitted to hospital service.  8/2, patient had a successful right thoracentesis with 30 mL of cloudy yellow right pleural fluid drainage. Chest x-rays obtained between 8/2-8/4 showed worsening right-sided hydropneumothorax with possible lung collapse. 8/4, CT chest showed esophageal perforation arising from the right lateral aspect of the mid to distal esophagus and large right-sided hydropneumothorax.  It also showed findings consistent with aspiration pneumonia. 8/4 patient underwent right-sided chest tube placement.  He was transferred from [facility] to [facility] for CT surgery  evaluation. 8/5, patient underwent right video-assisted thoracoscopy, decortication, EGD, PEG tube placement, esophageal stent placement with a 25 X 125 mm stent by CT surgery. Patient was subsequently transferred hospitalist service.  Subjective: Patient was seen and examined this morning.  Middle-aged African-American male.  Looks older for his age. Has facial symmetry probably secondary to previous stroke. Oriented to place only.  Slow to respond.  Wet cough present CT surgery remove large bore chest tube today.  Continues to have JP drain in place. Labs this morning with normal BMP, WBC slightly up at 13.2, hemoglobin remains low but stable at 7.8.  Assessment/Plan: Esophageal rupture leading to right-sided hydropneumothorax -Diagnosed on 8/4,  -8/5 underwent right VATS, esophageal stent placement, PEG tube placement -Thoracic surgery following.  Large bore chest tube out today.  Continues to have JP drain in place. -Noted a plan to obtain an esophagogram later this week.  Sepsis 2/2 aspiration pneumonia and esophageal rupture RT complex loculated parapneumonic effusion, freely flowing effusion on left:  -Currently on IV Zosyn and IV fluconazole.  Sepsis physiology resolved. -Longstanding history of reflux esophagitis, esophageal stricture.  Now has PEG tube in place -Continue to monitor temperature and WBC trend.  Acute respiratory with hypoxia -Secondary to pneumonia, hydropneumothorax, effusion -Initially required supplemental oxygen.  Currently on room air.  Acute delirium -Likely secondary to prolonged hospitalization and previous stroke. -Continue reorientation informed. -As needed IV Haldol.  Hypertension -Blood pressure elevated to 140s to 160s. -I see Cardizem in his case to home medicines but apparently was not taking it. -I do not see any history of A. Fib. -Started amlodipine 2.5 mg daily from today.  Continue to monitor heart rate and blood pressure.  Left  hydronephrosis and needs ESWL:  -Noted in CT  scan obtained while in the hospital.  Seen by urology.  Lithotripsy was scheduled on 8/10 but patient is to remain in the hospital. -Patient will follow up with Urology on discharge.  Prior history of stroke -Prior to admission, patient was on aspirin 325 mg daily and Lipitor 40 mg daily. -I would keep aspirin on hold for now because of anemia.  Will resume Lipitor post discharge.  Acute on chronic anemia -Hemoglobin level is to be more than 12 prior to admission but ferritin level was low. -This hospitalization, hemoglobin level has been trending down, 7.8 this morning.  Not having any active blood loss. -Continue iron supplement.  Continue to monitor.  Mobility: PT eval ordered Code Status:   Code Status: Full Code  Nutritional status: Body mass index is 20.71 kg/m. Nutrition Problem: Moderate Malnutrition Etiology: chronic illness (Barrett's esophagus, GERD) Signs/Symptoms: moderate fat depletion, moderate muscle depletion Diet Order            Diet NPO time specified  Diet effective now                 DVT prophylaxis: enoxaparin (LOVENOX) injection 40 mg Start: 05/18/20 1000 SCDs Start: 05/11/20 0134   Antimicrobials:  IV Zosyn, fluconazole IV Fluid: Not on IV fluid  Consultants: CT surgery, PCCM signed off Family Communication:  None at bedside  Status is: Inpatient  Remains inpatient appropriate because:Ongoing diagnostic testing needed not appropriate for outpatient work up and IV treatments appropriate due to intensity of illness or inability to take PO   Dispo: The patient is from: Home              Anticipated d/c is to: SNF vs home              Anticipated d/c date is: 3 days              Patient currently is not medically stable to d/c.       Infusions:   sodium chloride 70 mL/hr at 05/16/20 2122   sodium chloride     feeding supplement (OSMOLITE 1.5 CAL) 1,000 mL (05/20/20 1709)   fluconazole  (DIFLUCAN) IV 400 mg (05/20/20 1131)   piperacillin-tazobactam (ZOSYN)  IV 3.375 g (05/21/20 0853)    Scheduled Meds:  acetaminophen (TYLENOL) oral liquid 160 mg/5 mL  1,000 mg Per Tube Q6H   Or   acetaminophen  1,000 mg Oral Q6H   amLODipine  2.5 mg Oral Daily   bisacodyl  10 mg Oral Daily   chlorhexidine  15 mL Mouth Rinse BID   Chlorhexidine Gluconate Cloth  6 each Topical Daily   enoxaparin (LOVENOX) injection  40 mg Subcutaneous Daily   feeding supplement (PROSource TF)  45 mL Per Tube BID   free water  200 mL Per Tube Q3H   mouth rinse  15 mL Mouth Rinse q12n4p   metoprolol tartrate  5 mg Intravenous Q6H   sennosides  5 mL Per Tube QHS    Antimicrobials: Anti-infectives (From admission, onward)   Start     Dose/Rate Route Frequency Ordered Stop   05/17/20 0900  fluconazole (DIFLUCAN) IVPB 400 mg     Discontinue     400 mg 100 mL/hr over 120 Minutes Intravenous Every 24 hours 05/17/20 0749     05/17/20 0600  vancomycin (VANCOREADY) IVPB 750 mg/150 mL  Status:  Discontinued        750 mg 150 mL/hr over 60 Minutes Intravenous Every 8 hours 05/16/20 2154 05/17/20 0903  05/17/20 0400  vancomycin (VANCOREADY) IVPB 750 mg/150 mL  Status:  Discontinued        750 mg 150 mL/hr over 60 Minutes Intravenous Every 8 hours 05/16/20 1945 05/16/20 2154   05/16/20 2200  vancomycin (VANCOREADY) IVPB 1250 mg/250 mL        1,250 mg 166.7 mL/hr over 90 Minutes Intravenous STAT 05/16/20 2153 05/16/20 2348   05/16/20 2000  vancomycin (VANCOREADY) IVPB 1250 mg/250 mL  Status:  Discontinued        1,250 mg 166.7 mL/hr over 90 Minutes Intravenous  Once 05/16/20 1945 05/16/20 2153   05/15/20 1600  piperacillin-tazobactam (ZOSYN) IVPB 3.375 g     Discontinue     3.375 g 12.5 mL/hr over 240 Minutes Intravenous Every 8 hours 05/15/20 0945     05/15/20 0930  piperacillin-tazobactam (ZOSYN) IVPB 3.375 g        3.375 g 100 mL/hr over 30 Minutes Intravenous  Once 05/15/20 0921 05/15/20  1009   05/13/20 2000  azithromycin (ZITHROMAX) tablet 500 mg        500 mg Oral Daily 05/13/20 1155 05/14/20 2127   05/10/20 2200  cefTRIAXone (ROCEPHIN) 2 g in sodium chloride 0.9 % 100 mL IVPB  Status:  Discontinued        2 g 200 mL/hr over 30 Minutes Intravenous Every 24 hours 05/10/20 2148 05/15/20 0911   05/10/20 2200  azithromycin (ZITHROMAX) 500 mg in sodium chloride 0.9 % 250 mL IVPB  Status:  Discontinued        500 mg 250 mL/hr over 60 Minutes Intravenous Every 24 hours 05/10/20 2148 05/13/20 1155      PRN meds: sodium chloride, Place/Maintain arterial line **AND** sodium chloride, [DISCONTINUED] acetaminophen **OR** acetaminophen, haloperidol lactate, hydrALAZINE, ipratropium-albuterol, ketorolac, ondansetron (ZOFRAN) IV   Objective: Vitals:   05/21/20 0412 05/21/20 0827  BP: (!) 146/94 (!) 149/83  Pulse:  83  Resp: 18 (!) 34  Temp: 98.6 F (37 C) 98.3 F (36.8 C)  SpO2:  100%    Intake/Output Summary (Last 24 hours) at 05/21/2020 1152 Last data filed at 05/21/2020 0622 Gross per 24 hour  Intake 766.34 ml  Output 1145 ml  Net -378.66 ml   Filed Weights   05/16/20 0500 05/16/20 2200 05/17/20 1425  Weight: 65.8 kg 63.6 kg 63.6 kg   Weight change:  Body mass index is 20.71 kg/m.   Physical Exam: General exam: Appears calm and comfortable.  Not in physical distress Skin: No rashes, lesions or ulcers. HEENT: Atraumatic, normocephalic, supple neck, no obvious bleeding Lungs: Clear to auscultation bilaterally.  Right side chest with JP drain in place CVS: Regular rate and rhythm, no murmur GI/Abd soft, nontender, nondistended, bowel sound present CNS: Alert, awake, oriented to place only.  Slow to respond.  Facial deviation to the left reportedly at baseline from previous stroke Psychiatry: Depressed look Extremities: No pedal edema, no calf tenderness  Data Review: I have personally reviewed the laboratory data and studies available.  Recent Labs  Lab  05/16/20 0519 05/16/20 0519 05/17/20 2036 05/18/20 0341 05/19/20 0129 05/20/20 0524 05/21/20 0832  WBC 15.7*  --   --  24.3* 17.9* 12.3* 13.2*  NEUTROABS  --   --   --   --   --  10.4* 11.3*  HGB 8.5*   < > 9.9* 7.8* 7.1* 7.0* 7.8*  HCT 27.6*   < > 29.0* 25.2* 22.7* 23.5* 25.8*  MCV 87.3  --   --  86.9 86.3 87.4  86.0  PLT 428*  --   --  469* 491* 489* 521*   < > = values in this interval not displayed.   Recent Labs  Lab 05/18/20 0341 05/19/20 0129 05/19/20 2026 05/20/20 0524 05/21/20 0832  NA 142 146* 147* 144 139  K 4.1 3.1* 3.6 4.5 3.7  CL 106 111 114* 110 106  CO2 24 23 25 22 23   GLUCOSE 139* 166* 133* 139* 147*  BUN 10 17 11 10 11   CREATININE 0.95 0.92 0.85 0.90 0.73  CALCIUM 7.9* 7.8* 8.0* 8.2* 8.4*  MG  --   --  2.0 1.7 1.7  PHOS  --   --  <1.0* 3.3 3.5   Lab Results  Component Value Date   HGBA1C 5.5 07/12/2018       Component Value Date/Time   CHOL 124 07/13/2018 0524   TRIG 82 07/13/2018 0524   HDL 31 (L) 07/13/2018 0524   CHOLHDL 4.0 07/13/2018 0524   VLDL 16 07/13/2018 0524   LDLCALC 77 07/13/2018 0524    Signed, Terrilee Croak, MD [facility] Pager: [phone] (Secure Chat preferred). 05/21/2020

## 2020-05-21 NOTE — Significant Event (Addendum)
Rapid Response Event Note   Reason for Call :  Called d/t inability to get JP drain to stay charged and CT with sucking sound around site.   Initial Focused Assessment:  Pt laying in bed in no distress. Pt alert to person and able to answer some questions and follow commands.  Lungs diminished t/o  R more diminished than L. Skin warm and dry. R CT has been pulled out a few inches. 1 drainage hole is visible and a sucking sound is coming from it. JP unable to be charged. Large air leak in sahara drain. Drainage hole taped-sucking sound and air leak in sahara no longer present, and JP able to stay charged.      Interventions:  PCXR-done prior to my arrival.  Drainage hole on chest tube taped.   Plan of Care:  Pt is in no distress. PCXR pending. Alert MD of happenings.  Anticipate d/cing chest tube and possibly replacing it. Please call RRT if further assistance needed.    Event Summary:   MD Notified: Bedside RN to alert MD.  Call [phone] Arrival [phone] End [id number]  Dillard Essex, RN

## 2020-05-21 NOTE — Progress Notes (Addendum)
Was about to clean pt.bec.he had a BM & noted the JP won't stay charged & heard a sound around the site when he coughs, but not in resp.distress.Asked charge nurse to assess pt.& noted air leak on the drain. Rapid nurse was called &came  to see pt.Portable CXR done .Rapid response nurse found 1 drainage hole is visible & CT has been pulled out maybe 1-2 inches .& she put a tape around the hole .Dr.Hendrickson was called & made aware.Will continue to monitor pt. Marland Kitchen

## 2020-05-21 NOTE — Evaluation (Signed)
Physical Therapy Evaluation Patient Details Name: Evan Chavez MRN: [id number] DOB: [birth year] Today's Date: 05/21/2020   History of Present Illness  59 yo male presents to the hospital on 7/28 with generalized weakness, productive cough and low-grade fever. Pt is currently being treated for hydropneumothorax, Sepsis aspiration pneumonia and esophagal rupture, AKI and acute delirium. Pt had successful R thoracentesis on 8/2. Pt had a chest tube put in on 8/2 and was removed 8/9. Pt also underwent a VATS, decortication, EGD, PEG tube placement, and a esophageal stent on 8/9. PMH of GERD, Barrett's esophagus, peptic ulcer disease with extensive gastric erosion, duodenal ulcer and esophageal stricture, also with history of prior stroke, hyperlipidemia, BPH, remote to cocaine abuse.  Clinical Impression  Pt was evaluated and assessed for the above diagnosis and the impairments listed below. Pt required min to mod assist for all bed mobility tasks. Pt O2 sats were noted to decrease to 90% on RA and noted SOB; returned to supine and oxygen sats returned to 95% on RA. RN notified. Pt lives at home with his mom who can provide 24/7 assist.  Pt would continue to benefit from acute therapy services in order to increase functional independence with mobility tasks. Will continue to follow acutely.     Follow Up Recommendations Other (comment) (SNF vs CIR pending progression)    Equipment Recommendations  None recommended by PT    Recommendations for Other Services       Precautions / Restrictions Precautions Precautions: Fall Restrictions Weight Bearing Restrictions: No      Mobility  Bed Mobility Overal bed mobility: Needs Assistance Bed Mobility: Supine to Sit;Sit to Sidelying;Rolling Rolling: Min assist   Supine to sit: Mod assist;HOB elevated   Sit to sidelying: Mod assist;HOB elevated General bed mobility comments: pt required mod assist at trunk and BLE for supine to sit and then  required mod assist at BLE for sit to sidelying. When sitting EOB, pt with notable SOB and oxygen sats decreased to 91% on RA. Returned pt back to supine and sats returned to 95% on RA. Pt required min assist for assist for rolling for clean up following BM.   Transfers               Ambulation/Gait           Stairs         Wheelchair Mobility    Modified Rankin (Stroke Patients Only)       Balance Overall balance assessment: Needs assistance Sitting-balance support: Feet supported;No upper extremity supported Sitting balance-Leahy Scale: Fair Sitting balance - Comments: pt was able to maintain sitting balance with UE overhead while gait belt applied. pt was noted to be in forward flexed posture in sitting. Pt was cued to sit with upright posture; however, pt unable to maintain position             Pertinent Vitals/Pain Pain Assessment: No/denies pain    Home Living Family/patient expects to be discharged to:: Private residence Living Arrangements: Parent Available Help at Discharge: Family Type of Home: Apartment Home Access: Level entry     Home Layout: One level Home Equipment: Environmental consultant - 2 wheels;Wheelchair - power;Cane - single point      Prior Function Level of Independence: Independent               Hand Dominance        Extremity/Trunk Assessment   Upper Extremity Assessment Upper Extremity Assessment: Generalized weakness    Lower Extremity  Assessment Lower Extremity Assessment: Generalized weakness    Cervical / Trunk Assessment Cervical / Trunk Assessment: Normal  Communication   Communication: Expressive difficulties  Cognition Arousal/Alertness: Awake/alert Behavior During Therapy: Flat affect Overall Cognitive Status: No family/caregiver present to determine baseline cognitive functioning              General Comments: pt was noted to have expressive difficulties, could only state one word at a time. Hx of CVA       General Comments General comments (skin integrity, edema, etc.): OrthoBP: supine (HOB 30 degrees) - 143/86, sitting - 145/85, standing - (did not transfer). Pt O2 sats in supine WNL, sitting O2 sats dropped to 90% after 2 minutes. Pt was noted to increased RR and dyspnea. Pt noted to have weak productive cough     Exercises     Assessment/Plan    PT Assessment Patient needs continued PT services  PT Problem List Decreased strength;Decreased range of motion;Decreased activity tolerance;Decreased balance;Decreased mobility;Decreased coordination;Decreased knowledge of use of DME;Decreased safety awareness;Cardiopulmonary status limiting activity       PT Treatment Interventions DME instruction;Gait training;Stair training;Functional mobility training;Therapeutic activities;Therapeutic exercise;Balance training;Neuromuscular re-education;Patient/family education    PT Goals (Current goals can be found in the Care Plan section)  Acute Rehab PT Goals Patient Stated Goal: none stated PT Goal Formulation: Patient unable to participate in goal setting Time For Goal Achievement: 06/04/20 Potential to Achieve Goals: Fair    Frequency Min 3X/week   Barriers to discharge        Co-evaluation               AM-PAC PT "6 Clicks" Mobility  Outcome Measure Help needed turning from your back to your side while in a flat bed without using bedrails?: A Little Help needed moving from lying on your back to sitting on the side of a flat bed without using bedrails?: A Lot Help needed moving to and from a bed to a chair (including a wheelchair)?: A Lot Help needed standing up from a chair using your arms (e.g., wheelchair or bedside chair)?: A Lot Help needed to walk in hospital room?: A Lot Help needed climbing 3-5 steps with a railing? : Total 6 Click Score: 12    End of Session Equipment Utilized During Treatment: Gait belt Activity Tolerance: Patient limited by fatigue Patient left: in  bed;with call Suren Payne/phone within reach;with bed alarm set Nurse Communication: Mobility status (pt with SOB ) PT Visit Diagnosis: Unsteadiness on feet (R26.81);Muscle weakness (generalized) (M62.81)    Time: 5670-1410 PT Time Calculation (min) (ACUTE ONLY): 30 min   Charges:   PT Evaluation $PT Eval Moderate Complexity: 1 Mod PT Treatments $Therapeutic Activity: 8-22 mins       Gloriann Loan, SPT  Acute Rehabilitation Services  Office: [phone]  05/21/2020, 2:49 PM

## 2020-05-21 NOTE — Progress Notes (Addendum)
[address]       [place] [address]             [phone]       4 Days Post-Op Procedure(s) (LRB): RIGHT VIDEO ASSISTED THORACOSCOPY (VATS)/DECORTICATION (Right) ESOPHAGEAL STENT PLACEMENT AND  PEG TUBE PLACEMENT (N/A) ESOPHAGOGASTRODUODENOSCOPY (EGD) (N/A) Subjective:  Events from earlier this AM regarding the right pleural tube noted.  Evan Chavez is currently resting quietly in bed.  He is awake and responsive and cooperative but not talking.   Objective: Vital signs in last 24 hours: Temp:  [98.3 F (36.8 C)-99.6 F (37.6 C)] 98.3 F (36.8 C) (08/09 0827) Pulse Rate:  [76-98] 83 (08/09 0827) Cardiac Rhythm: Normal sinus rhythm (08/09 0716) Resp:  [18-34] 34 (08/09 0827) BP: (136-154)/(65-94) 149/83 (08/09 0827) SpO2:  [96 %-100 %] 100 % (08/09 0827)      Intake/Output from previous day: 08/08 0701 - 08/09 0700 In: 766.3 [NG/GT:660; IV Piggyback:106.3] Out: 9163 [Urine:1500; Drains:45; Chest Tube:100] Intake/Output this shift: No intake/output data recorded.  General appearance: alert, cooperative and no apparent distress Heart: regular rate and rhythm Lungs: breath sounds coarse bilat. The right pleural tube (larger Argyle) is partially withdrawn exposing the proximal drain hole which is currently covered with tape.  The Blake drain is secure and attached to a colapsed suction bulb with a few ml of serous drainage.  Wound: The right chest wounds / chest tube insertions sited are dry and appear healthy.   Lab Results: Recent Labs    05/20/20 0524 05/21/20 0832  WBC 12.3* 13.2*  HGB 7.0* 7.8*  HCT 23.5* 25.8*  PLT 489* 521*   BMET:  Recent Labs    05/20/20 0524 05/21/20 0832  NA 144 139  K 4.5 3.7  CL 110 106  CO2 22 23  GLUCOSE 139* 147*  BUN 10 11  CREATININE 0.90 0.73  CALCIUM 8.2* 8.4*    PT/INR: No results for input(s): LABPROT, INR in the last 72 hours. ABG    Component Value Date/Time   PHART 7.402 05/17/2020  2036   HCO3 26.2 05/17/2020 2036   TCO2 27 05/17/2020 2036   O2SAT 94.0 05/17/2020 2036   CBG (last 3)  Recent Labs    05/20/20 2356 05/21/20 0408 05/21/20 0736  GLUCAP 117* 131* 140*    Assessment/Plan: S/P Procedure(s) (LRB): RIGHT VIDEO ASSISTED THORACOSCOPY (VATS)/DECORTICATION (Right) ESOPHAGEAL STENT PLACEMENT AND  PEG TUBE PLACEMENT (N/A) ESOPHAGOGASTRODUODENOSCOPY (EGD) (N/A)  -POD4: Right VATS for drainage of loculated effusion with decortication     EGD with esophageal stenting for perforation      PEG placement in pt with h/o Barrett's esophagus.                -continue TF via PEG             - Removed the larger bore tube today and occlusive dressing                                     applied.Will leave the [place] drain in place for several days.   -CXR in AM             -Continue ABX per primary team.   -Plan for esophagram later this week.   LOS: 10 days    Antony Odea, [place] [phone] 05/21/2020  Agree with above Argyle CT removed Will obtain swallow  on Wed Will plan to go home with tube feeds, and blake drain.  Evan Chavez Bary Leriche

## 2020-05-22 ENCOUNTER — Inpatient Hospital Stay (HOSPITAL_COMMUNITY): Payer: Medicaid Other

## 2020-05-22 ENCOUNTER — Ambulatory Visit: Admit: 2020-05-22 | Payer: Medicaid Other | Admitting: Urology

## 2020-05-22 LAB — GLUCOSE, CAPILLARY
Glucose-Capillary: 128 mg/dL — ABNORMAL HIGH (ref 70–99)
Glucose-Capillary: 140 mg/dL — ABNORMAL HIGH (ref 70–99)
Glucose-Capillary: 138 mg/dL — ABNORMAL HIGH (ref 70–99)
Glucose-Capillary: 136 mg/dL — ABNORMAL HIGH (ref 70–99)
Glucose-Capillary: 120 mg/dL — ABNORMAL HIGH (ref 70–99)
Glucose-Capillary: 149 mg/dL — ABNORMAL HIGH (ref 70–99)

## 2020-05-22 LAB — CBC WITH DIFFERENTIAL/PLATELET
Abs Immature Granulocytes: 0.16 10*3/uL — ABNORMAL HIGH (ref 0.00–0.07)
Basophils Absolute: 0.1 10*3/uL (ref 0.0–0.1)
Basophils Relative: 0 %
Eosinophils Absolute: 0.1 10*3/uL (ref 0.0–0.5)
Eosinophils Relative: 0 %
HCT: 25.4 % — ABNORMAL LOW (ref 39.0–52.0)
Hemoglobin: 7.8 g/dL — ABNORMAL LOW (ref 13.0–17.0)
Immature Granulocytes: 1 %
Lymphocytes Relative: 5 %
Lymphs Abs: 1 10*3/uL (ref 0.7–4.0)
MCH: 26.8 pg (ref 26.0–34.0)
MCHC: 30.7 g/dL (ref 30.0–36.0)
MCV: 87.3 fL (ref 80.0–100.0)
Monocytes Absolute: 1 10*3/uL (ref 0.1–1.0)
Monocytes Relative: 5 %
Neutro Abs: 18.8 10*3/uL — ABNORMAL HIGH (ref 1.7–7.7)
Neutrophils Relative %: 89 %
Platelets: 571 10*3/uL — ABNORMAL HIGH (ref 150–400)
RBC: 2.91 MIL/uL — ABNORMAL LOW (ref 4.22–5.81)
RDW: 14.4 % (ref 11.5–15.5)
WBC: 21.1 10*3/uL — ABNORMAL HIGH (ref 4.0–10.5)
nRBC: 0.5 % — ABNORMAL HIGH (ref 0.0–0.2)

## 2020-05-22 LAB — BASIC METABOLIC PANEL
Anion gap: 10 (ref 5–15)
BUN: 13 mg/dL (ref 6–20)
CO2: 25 mmol/L (ref 22–32)
Calcium: 8.8 mg/dL — ABNORMAL LOW (ref 8.9–10.3)
Chloride: 104 mmol/L (ref 98–111)
Creatinine, Ser: 0.77 mg/dL (ref 0.61–1.24)
GFR calc Af Amer: >60 mL/min (ref >60)
GFR calc non Af Amer: >60 mL/min (ref >60)
Glucose, Bld: 157 mg/dL — ABNORMAL HIGH (ref 70–99)
Potassium: 3.9 mmol/L (ref 3.5–5.1)
Sodium: 139 mmol/L (ref 135–145)

## 2020-05-22 LAB — AEROBIC/ANAEROBIC CULTURE W GRAM STAIN (SURGICAL/DEEP WOUND): Gram Stain: NONE SEEN

## 2020-05-22 SURGERY — LITHOTRIPSY, ESWL
Anesthesia: LOCAL | Laterality: Left

## 2020-05-22 MED ORDER — HYDRALAZINE HCL 20 MG/ML IJ SOLN: 10.0000 mg | INTRAMUSCULAR | Status: DC | PRN

## 2020-05-22 MED ORDER — METOPROLOL TARTRATE 5 MG/5ML IV SOLN
2.5000 mg | Freq: Four times a day (QID) | INTRAVENOUS | Status: DC | PRN
  Administered 2020-05-22: 2.5 mg via INTRAVENOUS
  Filled 2020-05-22: qty 5

## 2020-05-22 MED ORDER — POTASSIUM CHLORIDE 10 MEQ/100ML IV SOLN
10.0000 meq | INTRAVENOUS | Status: AC
  Administered 2020-05-22 (×2): 10 meq via INTRAVENOUS
  Filled 2020-05-22 (×2): qty 100

## 2020-05-22 MED ORDER — METOPROLOL TARTRATE 5 MG/5ML IV SOLN
2.5000 mg | Freq: Once | INTRAVENOUS | Status: AC
  Administered 2020-05-22: 2.5 mg via INTRAVENOUS

## 2020-05-22 MED ORDER — AMLODIPINE BESYLATE 2.5 MG PO TABS: 2.5000 mg | ORAL_TABLET | Freq: Every day | ORAL | Status: DC

## 2020-05-22 MED ORDER — IPRATROPIUM-ALBUTEROL 0.5-2.5 (3) MG/3ML IN SOLN
3.0000 mL | Freq: Three times a day (TID) | RESPIRATORY_TRACT | Status: DC
  Administered 2020-05-22: 3 mL via RESPIRATORY_TRACT
  Filled 2020-05-22 (×2): qty 3

## 2020-05-22 MED ORDER — DILTIAZEM HCL 60 MG PO TABS
60.0000 mg | ORAL_TABLET | Freq: Two times a day (BID) | ORAL | Status: DC
  Administered 2020-05-23 (×2): 60 mg via ORAL
  Filled 2020-05-22 (×3): qty 1

## 2020-05-22 MED ORDER — DILTIAZEM HCL 60 MG PO TABS: 120.0000 mg | ORAL_TABLET | Freq: Every day | ORAL | Status: DC

## 2020-05-22 MED ORDER — IPRATROPIUM-ALBUTEROL 0.5-2.5 (3) MG/3ML IN SOLN
3.0000 mL | RESPIRATORY_TRACT | Status: DC | PRN
  Administered 2020-05-26 – 2020-05-31 (×14): 3 mL via RESPIRATORY_TRACT
  Filled 2020-05-22 (×17): qty 3

## 2020-05-22 MED ORDER — METOPROLOL TARTRATE 5 MG/5ML IV SOLN: 5.0000 mg | Freq: Four times a day (QID) | INTRAVENOUS | Status: DC | PRN

## 2020-05-22 MED ORDER — DILTIAZEM HCL 60 MG PO TABS
120.0000 mg | ORAL_TABLET | Freq: Every day | ORAL | Status: DC
  Filled 2020-05-22: qty 2

## 2020-05-22 MED ORDER — METOPROLOL TARTRATE 5 MG/5ML IV SOLN
5.0000 mg | INTRAVENOUS | Status: DC | PRN
  Filled 2020-05-22: qty 5

## 2020-05-22 MED ORDER — DILTIAZEM HCL 60 MG PO TABS
120.0000 mg | ORAL_TABLET | Freq: Every day | ORAL | Status: DC
  Administered 2020-05-22: 120 mg

## 2020-05-22 NOTE — Evaluation (Signed)
Occupational Therapy Evaluation Patient Details Name: Evan Chavez MRN: [id number] DOB: 1961-06-20 Today's Date: 05/22/2020    History of Present Illness 59 yo male presents to the hospital on 7/28 with generalized weakness, productive cough and low-grade fever. Pt is currently being treated for hydropneumothorax, Sepsis aspiration pneumonia and esophagal rupture, AKI and acute delirium. Pt had successful R thoracentesis on 8/2. Pt had a chest tube put in on 8/2 and was removed 8/9. Pt also underwent a VATS, decortication, EGD, PEG tube placement, and a esophageal stent on 8/9. PMH of GERD, Barrett's esophagus, peptic ulcer disease with extensive gastric erosion, duodenal ulcer and esophageal stricture, also with history of prior stroke, hyperlipidemia, BPH, remote to cocaine abuse.   Clinical Impression   PTA, pt was living at home with family, pt was independent with ADL and required assistance for IADL (particularly medication management) and pt reports he was modified independent with functional mobility with use of cane for community mobility. Pt with previous CVA, pt has expressive difficulties. Pt currently demonstrates decreased activity tolerance, generalized weakness and right rib pain impacting his independence with self-care. Pt required minA for sit<>stand and stand-pivot transfer to recliner. Following transfer pt declined further mobility. HR 102bpm at rest, up to 125bpm after transfer. Pt reporting dizziness BP 145/87. RN aware of vitals. Due to decline in current level of function, pt would benefit from acute OT to address established goals to facilitate safe D/C to venue listed below. At this time, recommend CIR follow-up. Will continue to follow acutely.     Follow Up Recommendations  CIR    Equipment Recommendations  Other (comment) (TBD at next venue)    Recommendations for Other Services       Precautions / Restrictions Precautions Precautions:  Fall Restrictions Weight Bearing Restrictions: No      Mobility Bed Mobility Overal bed mobility: Needs Assistance Bed Mobility: Supine to Sit     Supine to sit: Min assist;HOB elevated     General bed mobility comments: minA to progress to EOB and progress trunk to upright position;  Transfers Overall transfer level: Needs assistance Equipment used: 1 person hand held assist Transfers: Sit to/from [facility] Sit to Stand: Min assist Stand pivot transfers: Min assist       General transfer comment: minA to powerup into standing and transfer to recliner with 1 person hand held assistance;pt limited due to elevated HR and reports of dizziness    Balance Overall balance assessment: Needs assistance Sitting-balance support: Feet supported;No upper extremity supported Sitting balance-Leahy Scale: Fair Sitting balance - Comments: able to maintain sitting balance when reaching outside base of support, forward flexed position reports pain in right ribs   Standing balance support: Single extremity supported;During functional activity Standing balance-Leahy Scale: Poor Standing balance comment: single UE support during stand-pivot transfer                           ADL either performed or assessed with clinical judgement   ADL Overall ADL's : Needs assistance/impaired Eating/Feeding: NPO   Grooming: Set up;Sitting   Upper Body Bathing: Min guard;Sitting   Lower Body Bathing: Minimal assistance;Sit to/from stand   Upper Body Dressing : Min guard;Sitting   Lower Body Dressing: Minimal assistance;Sit to/from stand   Toilet Transfer: Minimal Scientist, forensic Details (indicate cue type and reason): stand-pivot to recliner, limited by elevated HR up to 125bpm Toileting- Clothing Manipulation and Hygiene: Minimal assistance;Sit to/from stand Toileting -  Clothing Manipulation Details (indicate cue type and reason): assist to  access feet     Functional mobility during ADLs: Minimal assistance General ADL Comments: pt limited by decreased activity tolerance, decreased cognition;pt required minA for stand-pivot transfer to recliner, pt reported dizziness, HR up to 125bpm (resting HR 103bpm)     Vision Patient Visual Report: No change from baseline       Perception     Praxis      Pertinent Vitals/Pain Pain Assessment: 0-10 Pain Score: 5  Pain Location: right ribs Pain Descriptors / Indicators: Sore Pain Intervention(s): Limited activity within patient's tolerance;Monitored during session     Hand Dominance Right   Extremity/Trunk Assessment Upper Extremity Assessment Upper Extremity Assessment: Generalized weakness;RUE deficits/detail RUE Deficits / Details: pt reports pain in shoulder and decreased ROM after fall;AROM shoulder flexion about 110*;strength grossly 3+/5 RUE Sensation: WNL RUE Coordination: WNL   Lower Extremity Assessment Lower Extremity Assessment: Generalized weakness   Cervical / Trunk Assessment Cervical / Trunk Assessment: Normal   Communication Communication Communication: Expressive difficulties   Cognition Arousal/Alertness: Awake/alert Behavior During Therapy: Flat affect Overall Cognitive Status: No family/caregiver present to determine baseline cognitive functioning                                 General Comments: pt with expressive difficulties at baseline, difficulty with word finding, able to select appropriate response when givien options;pt stating one word at a time, following commands consistently;oriented x3   General Comments  weak cough, HR 102 bpm at rest, up to 125bpm with stand-pivot to recliner;poor reading on pulse ox initially, noted 92% RA following transfer to recliner    Exercises Exercises: Other exercises Other Exercises Other Exercises: incentive spirometer IS x10 pt reaching 250 x2   Shoulder Instructions      Home  Living Family/patient expects to be discharged to:: Private residence Living Arrangements: Parent Available Help at Discharge: Family Type of Home: Apartment Home Access: Level entry     Home Layout: One level     Bathroom Shower/Tub: Teacher, early years/pre: Standard Bathroom Accessibility: Yes   Home Equipment: Environmental consultant - 2 wheels;Wheelchair - power;Cane - single point          Prior Functioning/Environment Level of Independence: Needs assistance  Gait / Transfers Assistance Needed: pt reports using cane for community mobility ADL's / Homemaking Assistance Needed: pt reports family assists with medication management, otherwise independent with self-care Communication / Swallowing Assistance Needed: expressive difficulties, pt with difficulty word finding          OT Problem List: Decreased strength;Decreased range of motion;Decreased activity tolerance;Impaired balance (sitting and/or standing);Decreased safety awareness;Decreased knowledge of use of DME or AE;Cardiopulmonary status limiting activity;Pain      OT Treatment/Interventions: Self-care/ADL training;Therapeutic exercise;Energy conservation;DME and/or AE instruction;Therapeutic activities;Cognitive remediation/compensation;Patient/family education;Balance training    OT Goals(Current goals can be found in the care plan section) Acute Rehab OT Goals Patient Stated Goal: none stated OT Goal Formulation: With patient Time For Goal Achievement: 06/05/20 Potential to Achieve Goals: Good ADL Goals Pt Will Perform Grooming: with set-up;standing;sitting Pt Will Perform Lower Body Dressing: with set-up;sit to/from stand Pt Will Transfer to Toilet: with set-up;ambulating Additional ADL Goal #1: Pt will demonstrate independence with use of 3-5 energy conservation strategies during ADL/IADL and functional mobility.  OT Frequency: Min 2X/week   Barriers to D/C:  Co-evaluation               AM-PAC OT "6 Clicks" Daily Activity     Outcome Measure Help from another person eating meals?: Total (NPO) Help from another person taking care of personal grooming?: A Little Help from another person toileting, which includes using toliet, bedpan, or urinal?: A Little Help from another person bathing (including washing, rinsing, drying)?: A Little Help from another person to put on and taking off regular upper body clothing?: A Little Help from another person to put on and taking off regular lower body clothing?: A Little 6 Click Score: 16   End of Session Equipment Utilized During Treatment: Gait belt Nurse Communication: Mobility status (vitals)  Activity Tolerance: Patient limited by fatigue;Patient tolerated treatment well Patient left: in chair;with call bell/phone within reach;with chair alarm set  OT Visit Diagnosis: Unsteadiness on feet (R26.81);Other abnormalities of gait and mobility (R26.89);Muscle weakness (generalized) (M62.81);Other symptoms and signs involving cognitive function;Feeding difficulties (R63.3);Pain Pain - Right/Left: Right Pain - part of body:  (ribs)                Time: 0939-1000 OT Time Calculation (min): 21 min Charges:  OT General Charges $OT Visit: 1 Visit OT Evaluation $OT Eval Moderate Complexity: [place] OTR/L Acute Rehabilitation Services Office: [place] 05/22/2020, 10:37 AM

## 2020-05-22 NOTE — Progress Notes (Signed)
While Respiratory was working with the pt with flutter valve, pt started vomiting blood. Nursing noted 2 large dark red blood clots on his gown. Nursing obtained a set of vital signs and made MD Dr. Pietro Cassis aware.

## 2020-05-22 NOTE — Progress Notes (Signed)
PROGRESS NOTE  Evan Chavez  DOB: May 14, 1961  PCP: Rosita Fire, MD MPN:[id number]  DOA: 05/10/2020  LOS: 11 days   Chief Complaint  Patient presents with  . Shortness of Breath   Brief narrative: Evan Chavez is a 59 y.o. male with PMH of GERD, Barrett's esophagus, peptic ulcer disease with extensive gastric erosion, duodenal ulcer and esophageal stricture, also with history of prior stroke, hyperlipidemia, BPH, remote to cocaine abuse. He was hospitalized November 2019 with bibasal pneumonia and imaging showed esophageal stricture and impaired esophageal motility. EGD confirmed severe reflux esophagitis with peptic ulcer disease with extensive gastric erosions and duodenal ulcer.  Biopsy revealed Barrett's esophagus. He had a repeat EGD 12/2018 with balloon dilatation for peptic stricture, his dysphagia improved and surveillance EGD was advised.   He presented to the ED at [facility] on 7/28 with generalized weakness, productive cough and low-grade fever.   Chest x-ray showed bibasilar pneumonia, treated with amoxicillin and doxycycline.   He returned to the ED 7/29 due to increased shortness of breath.  Labs showed leukocytosis mild lactic acidosis.  CT angiogram of the chest showed bibasal consolidation and pleural effusions .  He was started on  ceftriaxone and azithromycin and admitted to hospital service.  8/2, patient had a successful right thoracentesis with 30 mL of cloudy yellow right pleural fluid drainage. Chest x-rays obtained between 8/2-8/4 showed worsening right-sided hydropneumothorax with possible lung collapse. 8/4, CT chest showed esophageal perforation arising from the right lateral aspect of the mid to distal esophagus and large right-sided hydropneumothorax.  It also showed findings consistent with aspiration pneumonia. 8/4 patient underwent right-sided chest tube placement.  He was transferred from [facility] to [facility] for CT surgery  evaluation. 8/5, patient underwent right video-assisted thoracoscopy, decortication, EGD, PEG tube placement, esophageal stent placement with a 25 X 125 mm stent by CT surgery. Patient was subsequently transferred hospitalist service.  Subjective: Patient was seen and examined this morning.   Sitting up in chair.  Not in distress.   Oriented to place and person.   Chart reviewed.  Blood pressure in 160s, heart rate in the 110s today. Labs from this morning showed WBC count elevated to 21.1, hemoglobin is low but stable at 7.8.  Assessment/Plan: Esophageal rupture leading to right-sided hydropneumothorax -Diagnosed on 8/4,  -8/5 underwent right VATS, esophageal stent placement, PEG tube placement -Thoracic surgery following.  Large bore chest tube removed on 8/9.  Continues to have JP drain in place. -Noted a plan to obtain an esophagogram later this week. -PEG tube in place.  Sepsis 2/2 aspiration pneumonia and esophageal rupture RT complex loculated parapneumonic effusion, freely flowing effusion on left: -Currently on IV Zosyn and IV fluconazole per CT surgery.  Sepsis physiology resolved. -Longstanding history of reflux esophagitis, esophageal stricture.  Now has PEG tube in place  Worsening leukocytosis -No fever but WBC count seems to be trending up, 21,000 today. -Continue antibiotics and antifungal for now. -Continue to monitor temperature and WBC trend.  Acute on chronic anemia -Hemoglobin level is to be more than 12 prior to admission but ferritin level was low. -In this this hospitalization, hemoglobin level has been running low. Likely secondary to surgical blood loss.  Remains low but stable at 7.8 this morning.  No other active blood loss at this time. -Continue iron supplement.  Continue to monitor.  Sinus tachycardia Essential hypertension -In last 24 hours, patient's blood pressure has remained elevated to 140s to 160s and heart rate  has increased to 110s. -I see  Cardizem in his list of home medicines but apparently was not taking it. -I do not see any history of A. Fib. -With new sinus tachycardia and hypertension, I would resume Cardizem at 120 mg daily today. -Continue to monitor heart rate and blood pressure.  Acute delirium -Likely secondary to prolonged hospitalization and previous stroke. -Continue reorientation efforts. -As needed IV Haldol.  Acute respiratory with hypoxia -resolved -Secondary to pneumonia, hydropneumothorax, effusion -Initially required supplemental oxygen.  Currently on room air.  Left hydronephrosis and needs ESWL:  -Noted in CT scan obtained while in the hospital.  Seen by urology.  Lithotripsy was scheduled on 8/10 but patient is to remain in the hospital. -Patient will follow up with Urology on discharge.  Prior history of stroke -Prior to admission, patient was on aspirin 325 mg daily and Lipitor 40 mg daily. -I would keep aspirin on hold for now because of anemia.  Will resume Lipitor post discharge.  Mobility: PT eval obtained.  CIR recommended. Code Status:   Code Status: Full Code  Nutritional status: Body mass index is 20.84 kg/m. Nutrition Problem: Moderate Malnutrition Etiology: chronic illness (Barrett's esophagus, GERD) Signs/Symptoms: moderate fat depletion, moderate muscle depletion Diet Order            Diet NPO time specified  Diet effective now                 DVT prophylaxis: enoxaparin (LOVENOX) injection 40 mg Start: 05/18/20 1000 SCDs Start: 05/11/20 0134   Antimicrobials:  IV Zosyn, fluconazole IV Fluid: Not on IV fluid  Consultants: CT surgery, PCCM signed off Family Communication:  None at bedside  Status is: Inpatient  Remains inpatient appropriate because:Ongoing diagnostic testing needed not appropriate for outpatient work up and IV treatments appropriate due to intensity of illness or inability to take PO   Dispo: The patient is from: Home              Anticipated  d/c is to: SNF  Vs CIR              Anticipated d/c date is: 3 days              Patient currently is not medically stable to d/c.   Infusions:  . sodium chloride 70 mL/hr at 05/16/20 2122  . sodium chloride    . feeding supplement (OSMOLITE 1.5 CAL) 1,000 mL (05/21/20 1752)  . fluconazole (DIFLUCAN) IV 400 mg (05/22/20 0814)  . piperacillin-tazobactam (ZOSYN)  IV 3.375 g (05/22/20 0813)  . potassium chloride      Scheduled Meds: . acetaminophen (TYLENOL) oral liquid 160 mg/5 mL  1,000 mg Per Tube Q6H   Or  . acetaminophen  1,000 mg Oral Q6H  . bisacodyl  10 mg Oral Daily  . chlorhexidine  15 mL Mouth Rinse BID  . [START ON 05/23/2020] diltiazem  120 mg Per Tube Daily  . enoxaparin (LOVENOX) injection  40 mg Subcutaneous Daily  . feeding supplement (PROSource TF)  45 mL Per Tube BID  . free water  200 mL Per Tube Q3H  . mouth rinse  15 mL Mouth Rinse q12n4p  . metoprolol tartrate  5 mg Intravenous Q6H  . sennosides  5 mL Per Tube QHS    Antimicrobials: Anti-infectives (From admission, onward)   Start     Dose/Rate Route Frequency Ordered Stop   05/17/20 0900  fluconazole (DIFLUCAN) IVPB 400 mg     Discontinue  400 mg 100 mL/hr over 120 Minutes Intravenous Every 24 hours 05/17/20 0749     05/17/20 0600  vancomycin (VANCOREADY) IVPB 750 mg/150 mL  Status:  Discontinued        750 mg 150 mL/hr over 60 Minutes Intravenous Every 8 hours 05/16/20 2154 05/17/20 0903   05/17/20 0400  vancomycin (VANCOREADY) IVPB 750 mg/150 mL  Status:  Discontinued        750 mg 150 mL/hr over 60 Minutes Intravenous Every 8 hours 05/16/20 1945 05/16/20 2154   05/16/20 2200  vancomycin (VANCOREADY) IVPB 1250 mg/250 mL        1,250 mg 166.7 mL/hr over 90 Minutes Intravenous STAT 05/16/20 2153 05/16/20 2348   05/16/20 2000  vancomycin (VANCOREADY) IVPB 1250 mg/250 mL  Status:  Discontinued        1,250 mg 166.7 mL/hr over 90 Minutes Intravenous  Once 05/16/20 1945 05/16/20 2153   05/15/20 1600   piperacillin-tazobactam (ZOSYN) IVPB 3.375 g     Discontinue     3.375 g 12.5 mL/hr over 240 Minutes Intravenous Every 8 hours 05/15/20 0945     05/15/20 0930  piperacillin-tazobactam (ZOSYN) IVPB 3.375 g        3.375 g 100 mL/hr over 30 Minutes Intravenous  Once 05/15/20 0921 05/15/20 1009   05/13/20 2000  azithromycin (ZITHROMAX) tablet 500 mg        500 mg Oral Daily 05/13/20 1155 05/14/20 2127   05/10/20 2200  cefTRIAXone (ROCEPHIN) 2 g in sodium chloride 0.9 % 100 mL IVPB  Status:  Discontinued        2 g 200 mL/hr over 30 Minutes Intravenous Every 24 hours 05/10/20 2148 05/15/20 0911   05/10/20 2200  azithromycin (ZITHROMAX) 500 mg in sodium chloride 0.9 % 250 mL IVPB  Status:  Discontinued        500 mg 250 mL/hr over 60 Minutes Intravenous Every 24 hours 05/10/20 2148 05/13/20 1155      PRN meds: sodium chloride, Place/Maintain arterial line **AND** sodium chloride, [DISCONTINUED] acetaminophen **OR** acetaminophen, haloperidol lactate, hydrALAZINE, ipratropium-albuterol, ketorolac, ondansetron (ZOFRAN) IV   Objective: Vitals:   05/22/20 0320 05/22/20 0813  BP: (!) 146/83 (!) 160/94  Pulse: (!) 111   Resp: 20   Temp: 99.1 F (37.3 C) 98.5 F (36.9 C)  SpO2: 100%     Intake/Output Summary (Last 24 hours) at 05/22/2020 1120 Last data filed at 05/22/2020 0640 Gross per 24 hour  Intake 1198.93 ml  Output 910 ml  Net 288.93 ml   Filed Weights   05/16/20 2200 05/17/20 1425 05/22/20 0320  Weight: 63.6 kg 63.6 kg 64 kg   Weight change:  Body mass index is 20.84 kg/m.   Physical Exam: General exam: Appears calm and comfortable. Not in physical distress. Skin: No rashes, lesions or ulcers. HEENT: Atraumatic, normocephalic, supple neck, no obvious bleeding Lungs: Clear to auscultation bilaterally.  Right thorax JP drain in place CVS: Regular rate and rhythm, no murmur GI/Abd soft, nontender, nondistended, bowel sound present.  PEG tube site intact CNS: Alert, awake,  oriented to place only.  Slow to respond.  Facial deviation to the left reportedly at baseline from previous stroke Psychiatry: Depressed look Extremities: No pedal edema, no calf tenderness  Data Review: I have personally reviewed the laboratory data and studies available.  Recent Labs  Lab 05/18/20 0341 05/19/20 0129 05/20/20 0524 05/21/20 0832 05/22/20 0418  WBC 24.3* 17.9* 12.3* 13.2* 21.1*  NEUTROABS  --   --  10.4*  11.3* 18.8*  HGB 7.8* 7.1* 7.0* 7.8* 7.8*  HCT 25.2* 22.7* 23.5* 25.8* 25.4*  MCV 86.9 86.3 87.4 86.0 87.3  PLT 469* 491* 489* 521* 571*   Recent Labs  Lab 05/19/20 0129 05/19/20 2026 05/20/20 0524 05/21/20 0832 05/22/20 0418  NA 146* 147* 144 139 139  K 3.1* 3.6 4.5 3.7 3.9  CL 111 114* 110 106 104  CO2 23 25 22 23 25   GLUCOSE 166* 133* 139* 147* 157*  BUN 17 11 10 11 13   CREATININE 0.92 0.85 0.90 0.73 0.77  CALCIUM 7.8* 8.0* 8.2* 8.4* 8.8*  MG  --  2.0 1.7 1.7  --   PHOS  --  <1.0* 3.3 3.5  --    Lab Results  Component Value Date   HGBA1C 5.5 07/12/2018       Component Value Date/Time   CHOL 124 07/13/2018 0524   TRIG 82 07/13/2018 0524   HDL 31 (L) 07/13/2018 0524   CHOLHDL 4.0 07/13/2018 0524   VLDL 16 07/13/2018 0524   LDLCALC 77 07/13/2018 0524    Signed, Terrilee Croak, MD [facility] Pager: [phone] (Secure Chat preferred). 05/22/2020

## 2020-05-22 NOTE — Progress Notes (Addendum)
      [address]       [place] [address]             [phone]        5 Days Post-Op Procedure(s) (LRB): RIGHT VIDEO ASSISTED THORACOSCOPY (VATS)/DECORTICATION (Right) ESOPHAGEAL STENT PLACEMENT AND  PEG TUBE PLACEMENT (N/A) ESOPHAGOGASTRODUODENOSCOPY (EGD) (N/A)  Subjective: Patient denies nausea or vomiting  Objective: Vital signs in last 24 hours: Temp:  [98.3 F (36.8 C)-99.6 F (37.6 C)] 98.5 F (36.9 C) (08/10 0813) Pulse Rate:  [97-113] 111 (08/10 0320) Cardiac Rhythm: Sinus tachycardia (08/10 0813) Resp:  [19-20] 20 (08/10 0320) BP: (134-160)/(80-94) 160/94 (08/10 0813) SpO2:  [95 %-100 %] 100 % (08/10 0320) Weight:  [64 kg] 64 kg (08/10 0320)  Current Weight  05/22/20 64 kg       Intake/Output from previous day: 08/09 0701 - 08/10 0700 In: 1198.9 [NG/GT:1079.8; IV Piggyback:119.1] Out: 910 [Urine:850; Drains:60]   Physical Exam:  Cardiovascular: Tachycardia Pulmonary: Slightly diminished right breath sounds Abdomen: Soft, non tender, bowel sounds present. Extremities: No lower extremity edema. Wounds: Clean and dry.  No erythema or signs of infection.  Lab Results: CBC: Recent Labs    05/21/20 0832 05/22/20 0418  WBC 13.2* 21.1*  HGB 7.8* 7.8*  HCT 25.8* 25.4*  PLT 521* 571*   BMET:  Recent Labs    05/21/20 0832 05/22/20 0418  NA 139 139  K 3.7 3.9  CL 106 104  CO2 23 25  GLUCOSE 147* 157*  BUN 11 13  CREATININE 0.73 0.77  CALCIUM 8.4* 8.8*    PT/INR:  Lab Results  Component Value Date   INR 1.1 05/11/2020   INR 1.1 05/10/2020   INR 1.02 07/12/2018   ABG:  INR: Will add last result for INR, ABG once components are confirmed Will add last 4 CBG results once components are confirmed  Assessment/Plan:  1. CV - ST 2.  Pulmonary - On room air. Drain with 60 cc sero sanguinous drainage last 24 hours. CXR this am shows no pneumothorax, small right pleural effusion and atelectasis. Encourage incentive  spirometer 3. GI-Continue tube feedings via PEG. Swallow study in am. 4.  Expected post op acute blood loss anemia - H and H this am 7.8 and 25.4 5. ID-Leukocytosis. WBC increased to 21,100. On Diflucan 400 mg IV daily and Zosyn 3.375 grams IV tid. Re check CBC in am 6. Supplement potassium  Donielle M ZimmermanPA-C 05/22/2020,9:20 AM  Agree with above Increased WBC Continue pulm toilet Swallow study tomorrow  Lajuana Matte

## 2020-05-22 NOTE — Progress Notes (Signed)
Pt HR in 140s. RR in 40s, desat to 85 at RA, on 4L SpO2 94% EKG obtained.MD notified. MD on bedside

## 2020-05-22 NOTE — Plan of Care (Signed)
°  Problem: Clinical Measurements: °Goal: Respiratory complications will improve °Outcome: Progressing °Goal: Cardiovascular complication will be avoided °Outcome: Progressing °  °Problem: Elimination: °Goal: Will not experience complications related to bowel motility °Outcome: Progressing °Goal: Will not experience complications related to urinary retention °Outcome: Progressing °  °Problem: Pain Managment: °Goal: General experience of comfort will improve °Outcome: Progressing °  °

## 2020-05-22 NOTE — Progress Notes (Signed)
RT came to assess patient for CPT. When I arrived, he had a flutter at bedside. BBS rhonchi, SAT 97%. I instructed patient with fluttler. Tolerated well initially, was able to bring up large amount of thick tan secretions. Attempted again, patient vomited large amount brown/black emesis which contained moderate amount of secretions as well. RN aware and cleaning up. Will try flutter again later. Not a good candidate for vest due to nausea

## 2020-05-23 ENCOUNTER — Inpatient Hospital Stay (HOSPITAL_COMMUNITY): Payer: Medicaid Other

## 2020-05-23 LAB — BASIC METABOLIC PANEL
Anion gap: 10 (ref 5–15)
BUN: 16 mg/dL (ref 6–20)
CO2: 24 mmol/L (ref 22–32)
Calcium: 8.6 mg/dL — ABNORMAL LOW (ref 8.9–10.3)
Chloride: 104 mmol/L (ref 98–111)
Creatinine, Ser: 0.78 mg/dL (ref 0.61–1.24)
GFR calc Af Amer: >60 mL/min (ref >60)
GFR calc non Af Amer: >60 mL/min (ref >60)
Glucose, Bld: 153 mg/dL — ABNORMAL HIGH (ref 70–99)
Potassium: 4 mmol/L (ref 3.5–5.1)
Sodium: 138 mmol/L (ref 135–145)

## 2020-05-23 LAB — CBC WITH DIFFERENTIAL/PLATELET
Abs Immature Granulocytes: 0.23 10*3/uL — ABNORMAL HIGH (ref 0.00–0.07)
Basophils Absolute: 0 10*3/uL (ref 0.0–0.1)
Basophils Relative: 0 %
Eosinophils Absolute: 0 10*3/uL (ref 0.0–0.5)
Eosinophils Relative: 0 %
HCT: 20.9 % — ABNORMAL LOW (ref 39.0–52.0)
Hemoglobin: 6.3 g/dL — CL (ref 13.0–17.0)
Immature Granulocytes: 1 %
Lymphocytes Relative: 4 %
Lymphs Abs: 0.9 10*3/uL (ref 0.7–4.0)
MCH: 26.6 pg (ref 26.0–34.0)
MCHC: 30.1 g/dL (ref 30.0–36.0)
MCV: 88.2 fL (ref 80.0–100.0)
Monocytes Absolute: 1.4 10*3/uL — ABNORMAL HIGH (ref 0.1–1.0)
Monocytes Relative: 6 %
Neutro Abs: 19 10*3/uL — ABNORMAL HIGH (ref 1.7–7.7)
Neutrophils Relative %: 89 %
Platelets: 582 10*3/uL — ABNORMAL HIGH (ref 150–400)
RBC: 2.37 MIL/uL — ABNORMAL LOW (ref 4.22–5.81)
RDW: 15.2 % (ref 11.5–15.5)
WBC: 21.6 10*3/uL — ABNORMAL HIGH (ref 4.0–10.5)
nRBC: 0.5 % — ABNORMAL HIGH (ref 0.0–0.2)

## 2020-05-23 LAB — CBC
HCT: 27.6 % — ABNORMAL LOW (ref 39.0–52.0)
Hemoglobin: 9 g/dL — ABNORMAL LOW (ref 13.0–17.0)
MCH: 27.9 pg (ref 26.0–34.0)
MCHC: 32.6 g/dL (ref 30.0–36.0)
MCV: 85.4 fL (ref 80.0–100.0)
Platelets: 506 10*3/uL — ABNORMAL HIGH (ref 150–400)
RBC: 3.23 MIL/uL — ABNORMAL LOW (ref 4.22–5.81)
RDW: 14.6 % (ref 11.5–15.5)
WBC: 24.1 10*3/uL — ABNORMAL HIGH (ref 4.0–10.5)
nRBC: 0.7 % — ABNORMAL HIGH (ref 0.0–0.2)

## 2020-05-23 LAB — GLUCOSE, CAPILLARY
Glucose-Capillary: 125 mg/dL — ABNORMAL HIGH (ref 70–99)
Glucose-Capillary: 150 mg/dL — ABNORMAL HIGH (ref 70–99)
Glucose-Capillary: 148 mg/dL — ABNORMAL HIGH (ref 70–99)
Glucose-Capillary: 124 mg/dL — ABNORMAL HIGH (ref 70–99)
Glucose-Capillary: 126 mg/dL — ABNORMAL HIGH (ref 70–99)
Glucose-Capillary: 128 mg/dL — ABNORMAL HIGH (ref 70–99)
Glucose-Capillary: 144 mg/dL — ABNORMAL HIGH (ref 70–99)

## 2020-05-23 LAB — MAGNESIUM: Magnesium: 1.8 mg/dL (ref 1.7–2.4)

## 2020-05-23 LAB — ABO/RH: ABO/RH(D): O POS

## 2020-05-23 LAB — PREPARE RBC (CROSSMATCH)

## 2020-05-23 MED ORDER — PANTOPRAZOLE SODIUM 40 MG IV SOLR
40.0000 mg | Freq: Two times a day (BID) | INTRAVENOUS | Status: DC
  Administered 2020-05-23 – 2020-06-10 (×37): 40 mg via INTRAVENOUS
  Filled 2020-05-23 (×38): qty 40

## 2020-05-23 MED ORDER — SODIUM CHLORIDE 0.9% IV SOLUTION: Freq: Once | INTRAVENOUS | Status: AC

## 2020-05-23 MED ORDER — IOHEXOL 300 MG/ML  SOLN
150.0000 mL | Freq: Once | INTRAMUSCULAR | Status: AC | PRN
  Administered 2020-05-23: 75 mL via ORAL

## 2020-05-23 MED ORDER — METOPROLOL TARTRATE 5 MG/5ML IV SOLN
5.0000 mg | INTRAVENOUS | Status: DC | PRN
  Administered 2020-05-23 – 2020-06-26 (×22): 5 mg via INTRAVENOUS
  Filled 2020-05-23 (×23): qty 5

## 2020-05-23 MED ORDER — IPRATROPIUM-ALBUTEROL 0.5-2.5 (3) MG/3ML IN SOLN
3.0000 mL | Freq: Two times a day (BID) | RESPIRATORY_TRACT | Status: DC
  Administered 2020-05-23 – 2020-05-24 (×3): 3 mL via RESPIRATORY_TRACT
  Filled 2020-05-23 (×3): qty 3

## 2020-05-23 NOTE — Progress Notes (Signed)
PT Cancellation Note  Patient Details Name: Evan Chavez MRN: [id number] DOB: Jul 17, 1961   Cancelled Treatment:    Reason Eval/Treat Not Completed: Other (comment) Pt with hgb of 6.3 and is receiving PRBC at this time.  Additionally, pt with tachycardia with HR 128 bpm.  Will f/u at later date as able.  Abran Richard, PT Acute Rehab Services Pager [phone] [name] 05/23/2020, 2:00 PM

## 2020-05-23 NOTE — Plan of Care (Signed)
  Problem: Elimination: Goal: Will not experience complications related to bowel motility Outcome: Progressing Goal: Will not experience complications related to urinary retention Outcome: Progressing   Problem: Pain Managment: Goal: General experience of comfort will improve Outcome: Progressing   Problem: Safety: Goal: Ability to remain free from injury will improve Outcome: Progressing   

## 2020-05-23 NOTE — Progress Notes (Signed)
Dr. Marlowe Sax informed of pt's continued tachypnea, tachycardia, and increased O2 requirement.  Orders received for IV metoprolol PRN and a portable CXR.  Will continue to monitor.  Jodell Cipro

## 2020-05-23 NOTE — Plan of Care (Signed)
  Problem: Education: Goal: Knowledge of General Education information will improve Description: Including pain rating scale, medication(s)/side effects and non-pharmacologic comfort measures Outcome: Not Progressing   Problem: Health Behavior/Discharge Planning: Goal: Ability to manage health-related needs will improve Outcome: Not Progressing   Problem: Clinical Measurements: Goal: Ability to maintain clinical measurements within normal limits will improve Outcome: Not Progressing Goal: Will remain free from infection Outcome: Not Progressing Goal: Diagnostic test results will improve Outcome: Not Progressing Goal: Respiratory complications will improve Outcome: Not Progressing Goal: Cardiovascular complication will be avoided Outcome: Not Progressing   Problem: Nutrition: Goal: Adequate nutrition will be maintained Outcome: Not Progressing

## 2020-05-23 NOTE — Progress Notes (Addendum)
   05/23/20 1104  Vitals  Temp 98.3 F (36.8 C)  Temp Source Oral  BP 130/83  BP Location Right Arm  BP Method Automatic  Patient Position (if appropriate) Lying  Pulse Rate (!) 111  Pulse Rate Source Monitor  Resp 20  Level of Consciousness  Level of Consciousness Alert  MEWS COLOR  MEWS Score Color Yellow  Oxygen Therapy  O2 Device Nasal Cannula  O2 Flow Rate (L/min) 4 L/min  Patient Activity (if Appropriate) In bed  Pulse Oximetry Type Continuous  Pain Assessment  Pain Scale 0-10  Pain Score 0  MEWS Score  MEWS Temp 0  MEWS Systolic 0  MEWS Pulse 2  MEWS RR 0  MEWS LOC 0  MEWS Score 2  Provider Notification  Provider Name/Title t  Monitoring patient. MEWS has been yellow. No changes from previous MD aware

## 2020-05-23 NOTE — Progress Notes (Signed)
Dr. Marlowe Sax paged at [phone] regarding Hgb of 6.3.  No return call received as of 0730.  Bedside shift report given to Beverlee Nims, [place].  Beverlee Nims informed of low Hgb.  Jodell Cipro

## 2020-05-23 NOTE — Progress Notes (Addendum)
   05/23/20 1607  Assess: MEWS Score  Temp 99.2 F (37.3 C)  BP (!) 144/91  Resp 20  Level of Consciousness Alert  SpO2 98 %  O2 Device Nasal Cannula  Patient Activity (if Appropriate) In bed  O2 Flow Rate (L/min) 4 L/min  Assess: MEWS Score  MEWS Temp 0  MEWS Systolic 0  MEWS Pulse 2  MEWS RR 0  MEWS LOC 0  MEWS Score 2  MEWS Score Color Yellow  Assess: if the MEWS score is Yellow or Red  Were vital signs taken at a resting state? Yes  Focused Assessment No change from prior assessment  Early Detection of Sepsis Score *See Row Information* Low  MEWS guidelines implemented *See Row Information* Yes  Treat  MEWS Interventions Administered scheduled meds/treatments  Pain Scale 0-10  Pain Score 0  Faces Pain Scale 0  Continuing to monitor. MD aware. And order CBC stat at this time to see what patient Hgb is and possibly infuse addition units of PRBC

## 2020-05-23 NOTE — Significant Event (Signed)
Rapid Response Event Note   Reason for Call :  Increased WOB and increased RR  Initial Focused Assessment:  Patient with Rhonchi through out, respiratory distress  BP 148/90  HR 133  RR 38   O2 sat 98% on [place]  Interventions:  NT suction:  Pt cough up copious amounts of secretions, suctioned out orally.  Patient much improved post NT suction  Plan of Care:  RN to call if patient has additional respiratory distress   Event Summary:   MD Notified:  Call Time:  [place] Time:  [place] End Time:  1800  Raliegh Ip, RN

## 2020-05-23 NOTE — Progress Notes (Signed)
   05/22/20 2230  Assess: if the MEWS score is Yellow or Red  Were vital signs taken at a resting state? Yes  Focused Assessment No change from prior assessment  Early Detection of Sepsis Score *See Row Information* Low  MEWS guidelines implemented *See Row Information* Yes  Treat  MEWS Interventions Administered prn meds/treatments;Escalated (See documentation below)  Pain Scale 0-10  Pain Score 0  Escalate  MEWS: Escalate Red: discuss with charge nurse/RN and provider, consider discussing with RRT (Notified of ongoing tachypnea, tachycardia, & > O2 demand)  Notify: Charge Nurse/RN  Name of Charge Nurse/RN Notified Angie, RN  Date Charge Nurse/RN Notified 05/22/20  Time Charge Nurse/RN Notified 2250  Notify: Provider  Provider Name/Title Shela Leff, MD  Date Provider Notified 05/22/20  Time Provider Notified 2230  Notification Type Page  Notification Reason Change in status  Response See new orders  Date of Provider Response 05/22/20  Time of Provider Response 2320  Document  Patient Outcome Stabilized after interventions  Progress note created (see row info) Yes

## 2020-05-23 NOTE — Progress Notes (Addendum)
[address]       [place] [address]             [phone]        6 Days Post-Op Procedure(s) (LRB): RIGHT VIDEO ASSISTED THORACOSCOPY (VATS)/DECORTICATION (Right) ESOPHAGEAL STENT PLACEMENT AND  PEG TUBE PLACEMENT (N/A) ESOPHAGOGASTRODUODENOSCOPY (EGD) (N/A) Subjective: Awake and alert, resting in bed. Offers no complaints but said he was short of breath when asked. Denies nausea and has had no further hematemesis since the episode yesterday.   Objective: Vital signs in last 24 hours: Temp:  [97.9 F (36.6 C)-99.5 F (37.5 C)] 99.1 F (37.3 C) (08/11 0741) Pulse Rate:  [94-128] 126 (08/11 0741) Cardiac Rhythm: Sinus tachycardia (08/11 0700) Resp:  [20-42] 20 (08/11 0741) BP: (135-150)/(74-88) 150/88 (08/11 0741) SpO2:  [86 %-100 %] 95 % (08/11 0741) Weight:  [65.6 kg] 65.6 kg (08/11 0427)     Intake/Output from previous day: 08/10 0701 - 08/11 0700 In: 1689.2 [NG/GT:1323.7; IV Piggyback:365.5] Out: 1225 [MPNTI:1443; Drains:50] Intake/Output this shift: No intake/output data recorded.  General appearance: alert, cooperative and mild distress Neurologic: No gross deficits Heart: S.Tachycardia in 120's-130. Lungs: Coarse crackles bilaterally, R>L. The right Blake drain had ~48ml serous drainage past 24 hours.  Abdomen: Soft, NT, the PEG is secure and the site is intact and dry.  Extremities: all warm and well perfused. IV sites left arm OK. Wound: the right chest [place] drain is secure and the site is dry.   Lab Results: Recent Labs    05/22/20 0418 05/23/20 0436  WBC 21.1* 21.6*  HGB 7.8* 6.3*  HCT 25.4* 20.9*  PLT 571* 582*   BMET:  Recent Labs    05/22/20 0418 05/23/20 0436  NA 139 138  K 3.9 4.0  CL 104 104  CO2 25 24  GLUCOSE 157* 153*  BUN 13 16  CREATININE 0.77 0.78  CALCIUM 8.8* 8.6*    PT/INR: No results for input(s): LABPROT, INR in the last 72 hours. ABG    Component Value Date/Time   PHART 7.402 05/17/2020  2036   HCO3 26.2 05/17/2020 2036   TCO2 27 05/17/2020 2036   O2SAT 94.0 05/17/2020 2036   CBG (last 3)  Recent Labs    05/23/20 0115 05/23/20 0424 05/23/20 0739  GLUCAP 125* 150* 148*    Assessment/Plan: S/P Procedure(s) (LRB): RIGHT VIDEO ASSISTED THORACOSCOPY (VATS)/DECORTICATION (Right) ESOPHAGEAL STENT PLACEMENT AND  PEG TUBE PLACEMENT (N/A) ESOPHAGOGASTRODUODENOSCOPY (EGD) (N/A)  -POD6: Right VATS for drainage of loculated effusion with decortication                EGD with esophageal stenting for perforation and PEG placement in pt with h/o Barrett's esophagus. TF on hold for esophogram later today.  -Anemia -Hb 6.3 this AM. Had an episode of vomiting yesterday that contained blood clots per RN. No evidence of bleeding since then.  Recommend transfusion per primary team.  Currently not on PPI or H2 blocker. Will add Protonix IV BID for now and reduce to once daily when H&H stable. Would also hold Lovenox and use SCD's until H&H stable.  -Hypoxia-O2 sat 89% on 4Lnc at time of exam. RR ~30.  CXR is showing increased density over right lower lung zone. Unsure if this is un-drained pleural fluid or worsening consolidation. Will await results of esophogram before proceeding with further workup.            -Aspiration pneumonia on admission--Continue ABX per primary team.  Antony Odea, PA-C [phone] 05/23/2020   Persistent leak noted on esophagram. Additionally has a hemoglobin of 6.3 likely due to a GI source. The patient has a distal stricture which will impede healing of esophageal perforation. We will obtain 3D imaging tomorrow to assess if there is an undrained fluid collection We will need to keep the patient n.p.o. with continued tube feeds.  Omeed Osuna Bary Leriche

## 2020-05-23 NOTE — Progress Notes (Addendum)
PROGRESS NOTE  Evan Chavez  DOB: 01-Aug-1961  PCP: Rosita Fire, MD PYP:[id number]  DOA: 05/10/2020  LOS: 12 days   Chief Complaint  Patient presents with  . Shortness of Breath   Brief narrative: Evan Chavez is a 59 y.o. male with PMH of GERD, Barrett's esophagus, peptic ulcer disease with extensive gastric erosion, duodenal ulcer and esophageal stricture, also with history of prior stroke, hyperlipidemia, BPH, remote to cocaine abuse. He was hospitalized November 2019 with bibasal pneumonia and imaging showed esophageal stricture and impaired esophageal motility. EGD confirmed severe reflux esophagitis with peptic ulcer disease with extensive gastric erosions and duodenal ulcer.  Biopsy revealed Barrett's esophagus. He had a repeat EGD 12/2018 with balloon dilatation for peptic stricture, his dysphagia improved and surveillance EGD was advised.   He presented to the ED at [facility] on 7/28 with generalized weakness, productive cough and low-grade fever.   Chest x-ray showed bibasilar pneumonia, treated with amoxicillin and doxycycline.   He returned to the ED 7/29 due to increased shortness of breath.  Labs showed leukocytosis mild lactic acidosis.  CT angiogram of the chest showed bibasal consolidation and pleural effusions .  He was started on  ceftriaxone and azithromycin and admitted to hospital service.  8/2, patient had a successful right thoracentesis with 30 mL of cloudy yellow right pleural fluid drainage. Chest x-rays obtained between 8/2-8/4 showed worsening right-sided hydropneumothorax with possible lung collapse. 8/4, CT chest showed esophageal perforation arising from the right lateral aspect of the mid to distal esophagus and large right-sided hydropneumothorax.  It also showed findings consistent with aspiration pneumonia. 8/4 patient underwent right-sided chest tube placement.  He was transferred from [facility] to [facility] for CT surgery  evaluation. 8/5, patient underwent right video-assisted thoracoscopy, decortication, EGD, PEG tube placement, esophageal stent placement with a 25 X 125 mm stent by CT surgery. Patient was subsequently transferred hospitalist service.  Subjective: Patient was seen and examined this morning.   Patient had an episode of hematemesis yesterday. Since last night, patient has remained tachycardic to 120s. Temperature 99.5.  On 4 L oxygen by nasal cannula. Labs from this morning shows WBC count continues to remain elevated at 21.6, hemoglobin down to 6.3.  Assessment/Plan: Esophageal rupture leading to right-sided hydropneumothorax Right-sided complex loculated parapneumonic effusion, freely flowing effusion on left -Diagnosed on 8/4,  -8/5 underwent right VATS, esophageal stent placement, PEG tube placement -Thoracic surgery following.  Large bore chest tube removed on 8/9.  Continues to have JP drain in place. -Noted a plan for esophagogram today. -PEG tube in place.  Sepsis 2/2 aspiration pneumonia and esophageal rupture -Currently on IV Zosyn and IV fluconazole per CT surgery.  -Sepsis physiology was resolving with broad-spectrum antibiotics.  However patient is now having low-grade fever, WBC count is trending up and is requiring higher flow oxygen. -Continue to monitor.  Hematemesis Acute blood loss anemia on chronic anemia -8/10, patient had an episode of hematemesis  -He has longstanding history of reflux esophagitis, esophageal stricture and underwent recently stenting. -Hemoglobin level down to 6.3 this morning.  I ordered for 2 units of blood transfusion this morning.  -Informed consent taken from the patient verbally. -Patient was on Lovenox subcu for DVT prophylaxis. I will switch from Lovenox to SCD boots for DVT prophylaxis. -Continue to monitor hemoglobin. Recent Labs    05/14/20 0604 05/15/20 0331 05/16/20 2458 05/17/20 2036 05/18/20 0341 05/19/20 0129 05/20/20 0524  05/21/20 0832 05/22/20 0418 05/23/20 0436  HGB 9.1*  9.2* 8.5* 9.9* 7.8* 7.1* 7.0* 7.8* 7.8* 6.3*   Acute respiratory with hypoxia -resolved -Secondary to pneumonia, hydropneumothorax, effusion -Currently on 4 L oxygen by nasal cannula.  Continue to wean down as tolerated.  Sinus tachycardia Essential hypertension -In last 24 hours, patient's heart rate has been in 120s sinus tachycardia and blood pressure has remained elevated to 140s to 160s. -Currently on Cardizem 60 mg twice daily as well as IV metoprolol as needed.   -Tachycardia is also partly secondary to hypoxia and acute blood loss anemia.   -Continue to monitor. -No history of A. fib.  Remains sinus tachycardia on monitor.  Acute delirium -Likely secondary to prolonged hospitalization and previous stroke. -Continue reorientation efforts. -As needed IV Haldol.  Left hydronephrosis and needs ESWL -Noted in CT scan obtained while in the hospital.  Seen by urology. Lithotripsy was scheduled on 8/10 but patient is to remain in the hospital. -Patient will follow up with Urology on discharge.  Prior history of stroke -Prior to admission, patient was on aspirin 325 mg daily and Lipitor 40 mg daily. -Aspirin is currently on hold.  Will resume Lipitor post discharge.  Mobility: PT eval obtained.  CIR recommended. Code Status:   Code Status: Full Code  Nutritional status: Body mass index is 21.36 kg/m. Nutrition Problem: Moderate Malnutrition Etiology: chronic illness (Barrett's esophagus, GERD) Signs/Symptoms: moderate fat depletion, moderate muscle depletion Diet Order            Diet NPO time specified  Diet effective now                 DVT prophylaxis: Place and maintain sequential compression device Start: 05/23/20 1040 SCDs Start: 05/11/20 0134   Antimicrobials:  IV Zosyn, fluconazole IV Fluid: Not on IV fluid  Consultants: CT surgery, PCCM signed off Family Communication:  None at bedside  Status is:  Inpatient  Remains inpatient appropriate because:Ongoing diagnostic testing needed not appropriate for outpatient work up and IV treatments appropriate due to intensity of illness or inability to take PO   Dispo: The patient is from: Home              Anticipated d/c is to: SNF  Vs CIR              Anticipated d/c date is: 3 days              Patient currently is not medically stable to d/c.   Infusions:  . sodium chloride 70 mL/hr at 05/16/20 2122  . sodium chloride    . feeding supplement (OSMOLITE 1.5 CAL) 1,000 mL (05/22/20 2003)  . fluconazole (DIFLUCAN) IV 400 mg (05/23/20 1032)  . piperacillin-tazobactam (ZOSYN)  IV 3.375 g (05/23/20 1025)    Scheduled Meds: . bisacodyl  10 mg Oral Daily  . chlorhexidine  15 mL Mouth Rinse BID  . diltiazem  60 mg Oral Q12H  . feeding supplement (PROSource TF)  45 mL Per Tube BID  . free water  200 mL Per Tube Q3H  . ipratropium-albuterol  3 mL Nebulization TID  . mouth rinse  15 mL Mouth Rinse q12n4p  . pantoprazole (PROTONIX) IV  40 mg Intravenous Q12H  . sennosides  5 mL Per Tube QHS    Antimicrobials: Anti-infectives (From admission, onward)   Start     Dose/Rate Route Frequency Ordered Stop   05/17/20 0900  fluconazole (DIFLUCAN) IVPB 400 mg     Discontinue     400 mg 100 mL/hr over 120  Minutes Intravenous Every 24 hours 05/17/20 0749     05/17/20 0600  vancomycin (VANCOREADY) IVPB 750 mg/150 mL  Status:  Discontinued        750 mg 150 mL/hr over 60 Minutes Intravenous Every 8 hours 05/16/20 2154 05/17/20 0903   05/17/20 0400  vancomycin (VANCOREADY) IVPB 750 mg/150 mL  Status:  Discontinued        750 mg 150 mL/hr over 60 Minutes Intravenous Every 8 hours 05/16/20 1945 05/16/20 2154   05/16/20 2200  vancomycin (VANCOREADY) IVPB 1250 mg/250 mL        1,250 mg 166.7 mL/hr over 90 Minutes Intravenous STAT 05/16/20 2153 05/16/20 2348   05/16/20 2000  vancomycin (VANCOREADY) IVPB 1250 mg/250 mL  Status:  Discontinued        1,250  mg 166.7 mL/hr over 90 Minutes Intravenous  Once 05/16/20 1945 05/16/20 2153   05/15/20 1600  piperacillin-tazobactam (ZOSYN) IVPB 3.375 g     Discontinue     3.375 g 12.5 mL/hr over 240 Minutes Intravenous Every 8 hours 05/15/20 0945     05/15/20 0930  piperacillin-tazobactam (ZOSYN) IVPB 3.375 g        3.375 g 100 mL/hr over 30 Minutes Intravenous  Once 05/15/20 0921 05/15/20 1009   05/13/20 2000  azithromycin (ZITHROMAX) tablet 500 mg        500 mg Oral Daily 05/13/20 1155 05/14/20 2127   05/10/20 2200  cefTRIAXone (ROCEPHIN) 2 g in sodium chloride 0.9 % 100 mL IVPB  Status:  Discontinued        2 g 200 mL/hr over 30 Minutes Intravenous Every 24 hours 05/10/20 2148 05/15/20 0911   05/10/20 2200  azithromycin (ZITHROMAX) 500 mg in sodium chloride 0.9 % 250 mL IVPB  Status:  Discontinued        500 mg 250 mL/hr over 60 Minutes Intravenous Every 24 hours 05/10/20 2148 05/13/20 1155      PRN meds: sodium chloride, Place/Maintain arterial line **AND** sodium chloride, [DISCONTINUED] acetaminophen **OR** acetaminophen, haloperidol lactate, hydrALAZINE, ipratropium-albuterol, metoprolol tartrate, ondansetron (ZOFRAN) IV   Objective: Vitals:   05/23/20 0427 05/23/20 0741  BP: 135/84 (!) 150/88  Pulse: (!) 107 (!) 126  Resp: (!) 23 20  Temp: 99.5 F (37.5 C) 99.1 F (37.3 C)  SpO2: 95% 95%    Intake/Output Summary (Last 24 hours) at 05/23/2020 1044 Last data filed at 05/23/2020 0600 Gross per 24 hour  Intake 1689.15 ml  Output 1225 ml  Net 464.15 ml   Filed Weights   05/17/20 1425 05/22/20 0320 05/23/20 0427  Weight: 63.6 kg 64 kg 65.6 kg   Weight change: 1.6 kg Body mass index is 21.36 kg/m.   Physical Exam: General exam: Appears calm and comfortable but is breathing shallow. Skin: No rashes, lesions or ulcers. HEENT: Atraumatic, normocephalic, supple neck, no obvious bleeding Lungs: Clear to auscultation bilaterally.  Right thorax JP drain in place CVS: Sinus  tachycardia, no murmur GI/Abd soft, nontender, nondistended, bowel sound present.  PEG tube site intact CNS: Alert, awake, oriented to place only.  Slow to respond.  Facial deviation to the left reportedly at baseline from previous stroke Psychiatry: Depressed look Extremities: No pedal edema, no calf tenderness  Data Review: I have personally reviewed the laboratory data and studies available.  Recent Labs  Lab 05/19/20 0129 05/20/20 0524 05/21/20 0832 05/22/20 0418 05/23/20 0436  WBC 17.9* 12.3* 13.2* 21.1* 21.6*  NEUTROABS  --  10.4* 11.3* 18.8* 19.0*  HGB 7.1* 7.0* 7.8*  7.8* 6.3*  HCT 22.7* 23.5* 25.8* 25.4* 20.9*  MCV 86.3 87.4 86.0 87.3 88.2  PLT 491* 489* 521* 571* 582*   Recent Labs  Lab 05/19/20 2026 05/20/20 0524 05/21/20 0832 05/22/20 0418 05/23/20 0436 05/23/20 0844  NA 147* 144 139 139 138  --   K 3.6 4.5 3.7 3.9 4.0  --   CL 114* 110 106 104 104  --   CO2 25 22 23 25 24   --   GLUCOSE 133* 139* 147* 157* 153*  --   BUN 11 10 11 13 16   --   CREATININE 0.85 0.90 0.73 0.77 0.78  --   CALCIUM 8.0* 8.2* 8.4* 8.8* 8.6*  --   MG 2.0 1.7 1.7  --   --  1.8  PHOS <1.0* 3.3 3.5  --   --   --    Lab Results  Component Value Date   HGBA1C 5.5 07/12/2018       Component Value Date/Time   CHOL 124 07/13/2018 0524   TRIG 82 07/13/2018 0524   HDL 31 (L) 07/13/2018 0524   CHOLHDL 4.0 07/13/2018 0524   VLDL 16 07/13/2018 0524   LDLCALC 77 07/13/2018 0524    Signed, Terrilee Croak, MD [facility] Pager: [phone] (Secure Chat preferred). 05/23/2020

## 2020-05-23 NOTE — Progress Notes (Signed)
Patient off the floor for procedure.

## 2020-05-23 NOTE — Progress Notes (Signed)
IP rehab admissions - I met with patient today briefly but he was very sleepy.  I called his mother, but no answer and no voice mail set up.  I called son, Evan Chavez, who said call patient's mother.  I will try to meet with patient again in am.  I have started insurance preauthorization with his Medicaid requesting CIR and will await call back from insurance carrier.  I will follow up again in am.  Call me for questions.  [phone]

## 2020-05-24 ENCOUNTER — Inpatient Hospital Stay (HOSPITAL_COMMUNITY): Payer: Medicaid Other

## 2020-05-24 ENCOUNTER — Encounter (HOSPITAL_COMMUNITY): Payer: Self-pay | Admitting: Thoracic Surgery (Cardiothoracic Vascular Surgery)

## 2020-05-24 DIAGNOSIS — R933 Abnormal findings on diagnostic imaging of other parts of digestive tract: Secondary | ICD-10-CM

## 2020-05-24 DIAGNOSIS — Z8719 Personal history of other diseases of the digestive system: Secondary | ICD-10-CM

## 2020-05-24 DIAGNOSIS — I4891 Unspecified atrial fibrillation: Secondary | ICD-10-CM | POA: Diagnosis not present

## 2020-05-24 DIAGNOSIS — K223 Perforation of esophagus: Secondary | ICD-10-CM | POA: Diagnosis not present

## 2020-05-24 LAB — TYPE AND SCREEN
ABO/RH(D): O POS
Antibody Screen: NEGATIVE
Unit division: 0
Unit division: 0

## 2020-05-24 LAB — CBC WITH DIFFERENTIAL/PLATELET
Abs Immature Granulocytes: 0.39 10*3/uL — ABNORMAL HIGH (ref 0.00–0.07)
Basophils Absolute: 0.1 10*3/uL (ref 0.0–0.1)
Basophils Relative: 0 %
Eosinophils Absolute: 0.1 10*3/uL (ref 0.0–0.5)
Eosinophils Relative: 0 %
HCT: 28.3 % — ABNORMAL LOW (ref 39.0–52.0)
Hemoglobin: 9.2 g/dL — ABNORMAL LOW (ref 13.0–17.0)
Immature Granulocytes: 2 %
Lymphocytes Relative: 5 %
Lymphs Abs: 1.2 10*3/uL (ref 0.7–4.0)
MCH: 27.9 pg (ref 26.0–34.0)
MCHC: 32.5 g/dL (ref 30.0–36.0)
MCV: 85.8 fL (ref 80.0–100.0)
Monocytes Absolute: 1.2 10*3/uL — ABNORMAL HIGH (ref 0.1–1.0)
Monocytes Relative: 5 %
Neutro Abs: 22.7 10*3/uL — ABNORMAL HIGH (ref 1.7–7.7)
Neutrophils Relative %: 88 %
Platelets: 488 10*3/uL — ABNORMAL HIGH (ref 150–400)
RBC: 3.3 MIL/uL — ABNORMAL LOW (ref 4.22–5.81)
RDW: 14.9 % (ref 11.5–15.5)
WBC: 25.7 10*3/uL — ABNORMAL HIGH (ref 4.0–10.5)
nRBC: 0.4 % — ABNORMAL HIGH (ref 0.0–0.2)

## 2020-05-24 LAB — BPAM RBC
Blood Product Expiration Date: 202108292359
Blood Product Expiration Date: 202108292359
ISSUE DATE / TIME: 202108111050
ISSUE DATE / TIME: 202108111402
Unit Type and Rh: 5100
Unit Type and Rh: 5100

## 2020-05-24 LAB — BASIC METABOLIC PANEL
Anion gap: 10 (ref 5–15)
BUN: 11 mg/dL (ref 6–20)
CO2: 20 mmol/L — ABNORMAL LOW (ref 22–32)
Calcium: 8.1 mg/dL — ABNORMAL LOW (ref 8.9–10.3)
Chloride: 105 mmol/L (ref 98–111)
Creatinine, Ser: 0.85 mg/dL (ref 0.61–1.24)
GFR calc Af Amer: >60 mL/min (ref >60)
GFR calc non Af Amer: >60 mL/min (ref >60)
Glucose, Bld: 170 mg/dL — ABNORMAL HIGH (ref 70–99)
Potassium: 4.1 mmol/L (ref 3.5–5.1)
Sodium: 135 mmol/L (ref 135–145)

## 2020-05-24 LAB — GLUCOSE, CAPILLARY
Glucose-Capillary: 152 mg/dL — ABNORMAL HIGH (ref 70–99)
Glucose-Capillary: 156 mg/dL — ABNORMAL HIGH (ref 70–99)
Glucose-Capillary: 138 mg/dL — ABNORMAL HIGH (ref 70–99)
Glucose-Capillary: 133 mg/dL — ABNORMAL HIGH (ref 70–99)
Glucose-Capillary: 120 mg/dL — ABNORMAL HIGH (ref 70–99)

## 2020-05-24 LAB — MAGNESIUM: Magnesium: 2.1 mg/dL (ref 1.7–2.4)

## 2020-05-24 LAB — TSH: TSH: 0.989 u[IU]/mL (ref 0.350–4.500)

## 2020-05-24 MED ORDER — VANCOMYCIN HCL 1500 MG/300ML IV SOLN
1500.0000 mg | Freq: Once | INTRAVENOUS | Status: AC
  Administered 2020-05-24: 1500 mg via INTRAVENOUS
  Filled 2020-05-24: qty 300

## 2020-05-24 MED ORDER — DILTIAZEM HCL 25 MG/5ML IV SOLN
15.0000 mg | Freq: Once | INTRAVENOUS | Status: AC
  Administered 2020-05-24: 15 mg via INTRAVENOUS
  Filled 2020-05-24: qty 5

## 2020-05-24 MED ORDER — DILTIAZEM LOAD VIA INFUSION
15.0000 mg | Freq: Once | INTRAVENOUS | Status: AC
  Administered 2020-05-24: 15 mg via INTRAVENOUS
  Filled 2020-05-24: qty 15

## 2020-05-24 MED ORDER — DILTIAZEM HCL-DEXTROSE 125-5 MG/125ML-% IV SOLN (PREMIX)
5.0000 mg/h | INTRAVENOUS | Status: DC
  Administered 2020-05-24: 12.5 mg/h via INTRAVENOUS
  Administered 2020-05-24: 5 mg/h via INTRAVENOUS
  Administered 2020-05-25 – 2020-05-26 (×2): 12.5 mg/h via INTRAVENOUS
  Filled 2020-05-24 (×7): qty 125

## 2020-05-24 MED ORDER — IOHEXOL 350 MG/ML SOLN
80.0000 mL | Freq: Once | INTRAVENOUS | Status: AC | PRN
  Administered 2020-05-24: 80 mL via INTRAVENOUS

## 2020-05-24 MED ORDER — MAGNESIUM SULFATE IN D5W 1-5 GM/100ML-% IV SOLN
1.0000 g | Freq: Once | INTRAVENOUS | Status: AC
  Administered 2020-05-24: 1 g via INTRAVENOUS
  Filled 2020-05-24: qty 100

## 2020-05-24 MED ORDER — VANCOMYCIN HCL 750 MG/150ML IV SOLN
750.0000 mg | Freq: Two times a day (BID) | INTRAVENOUS | Status: DC
  Administered 2020-05-24 – 2020-05-29 (×9): 750 mg via INTRAVENOUS
  Filled 2020-05-24 (×10): qty 150

## 2020-05-24 NOTE — Consult Note (Addendum)
Cardiology Consultation:   Patient ID: Evan Chavez MRN: [id number]; DOB: 31-Oct-1960  Admit date: 05/10/2020 Date of Consult: 05/24/2020  Primary Care Provider: Rosita Fire, MD [facility] Cardiologist: [facility] Electrophysiologist:  None    Patient Profile:   Evan Chavez is a 59 y.o. male with a hx of prior stroke, HLD, GERD, Barrett's esophagus, PUD with gastric erosion, esophageal structure, BPH, history of alcohol/drug/tobacco abuse who is being seen today for the evaluation of new onset afib at the request of Dr. Pietro Cassis.  History of Present Illness:   [name] has not been seen by cardiology in the past. No history of MI, stent, or catheterization. Denies history of diabetes or CHF. He had an echo 07/2018 during admission for stroke. Echo showed LVEF 60-65%, G1DD, no wall motion abnormalities. He has a history of HLD on atorvastatin. Also was taking cardizem for blood pressure. Family history positive for father who died of a MI at age 35. He lives with his mother and pta was functional and able to perform ADLs. Patient has a history of alcohol, multiple drug use, and tobacco use however quit in 2019.   The patient presented to the ED at [facility] 7/28 with generalized weakness, productive cough and low grade fever. CXR showed bibasilar PNA and he was sent out with antibiotics.   The patient returned to the ED 7/29 with increasing sob. CT angiogram showed bibasilar consolidation and pleural effusions. He was started on ceftriaxone and azithromycin and admitted. On 8/2 right thoracentesis yielded 24mL of cloudy yellow drainage. Follow-up CXR showed right hydropneumothoax with possible lung collapse. CT of the chest on 8/4 showed esophageal perforation and rt sided hydropneumothorax and aspiration PNA and the patient thad a right sided chest tube placed and transferred to [facility]. On 8/5 the patient underwent video-assisted thoraoscopy, decortication, EGD, PEG tube  placement, esophageal stent placement by CT surgery.  Repeat esophagogram showed persistently leakage from esophagus to right pleural space. On 8/11 the patient reported of hematemesis.  8/12 the patient developed new onset Afib RVR started on IV dilt bolus/infusion. IV heparin was held due to [facility] and cardiology was consulted.  Labs: Na 135, K+ 4.1, Chl 105,  Glucose 170 Creatinine 0.85 BUN 11 Mag 2.1 WBC 25.7, Hgb 9.2 TSH 0.989  05/24/20 EKG: afib RVR, 131bpm   Past Medical History:  Diagnosis Date  . Cocaine abuse ([place])   . ETOH abuse   . GERD (gastroesophageal reflux disease)   . Hyperlipidemia   . Stroke (cerebrum) ([place]) 06/2018   right sided weakness    Past Surgical History:  Procedure Laterality Date  . BIOPSY  08/25/2018   Procedure: BIOPSY;  Surgeon: [name], MD;  Location: [facility];  Service: Endoscopy;;  . BIOPSY  12/13/2018   Procedure: BIOPSY;  Surgeon: [name], MD;  Location: [facility];  Service: Endoscopy;;  esophagus  . COLONOSCOPY WITH PROPOFOL N/A 04/07/2019   Dr. [name]: 3 Tubular adenomas removed (4 to 8 mm in size), next colonoscopy 3 years.  . ESOPHAGEAL STENT PLACEMENT N/A 05/17/2020   Procedure: ESOPHAGEAL STENT PLACEMENT AND  PEG TUBE PLACEMENT;  Surgeon: Lajuana Matte, MD;  Location: [place];  Service: Thoracic;  Laterality: N/A;  . ESOPHAGOGASTRODUODENOSCOPY N/A 05/17/2020   Procedure: ESOPHAGOGASTRODUODENOSCOPY (EGD);  Surgeon: Lajuana Matte, MD;  Location: [facility];  Service: Thoracic;  Laterality: N/A;  . ESOPHAGOGASTRODUODENOSCOPY (EGD) WITH PROPOFOL N/A 08/25/2018   Dr. [name]: Severely inflamed abnormal appearing mid/distal  esophagus.  Encroachment on lumen suspicious for infiltrating neoplasm located be all from severe benign inflammation, extensive gastric erosions and extensive duodenal ulcerations.  Query ischemic process versus other.  Biopsy revealed Barrett's esophagus but negative for malignancy and H. pylori.   . ESOPHAGOGASTRODUODENOSCOPY (EGD) WITH PROPOFOL N/A 12/13/2018   Dr. [name]: peptic stricture s/p balloon dilatation. abnormal esophageal mucosa, by c/w barrett's without dysplasia, previous PUD completely healed.   Marland Kitchen POLYPECTOMY  04/07/2019   Procedure: POLYPECTOMY;  Surgeon: [name], MD;  Location: [facility];  Service: Endoscopy;;  colon  . VIDEO ASSISTED THORACOSCOPY (VATS)/DECORTICATION Right 05/17/2020   Procedure: RIGHT VIDEO ASSISTED THORACOSCOPY (VATS)/DECORTICATION;  Surgeon: Lajuana Matte, MD;  Location: [place];  Service: Thoracic;  Laterality: Right;     Home Medications:  Prior to Admission medications   Medication Sig Start Date End Date Taking? Authorizing Provider  acetaminophen (TYLENOL) 500 MG tablet Take 1,000 mg by mouth every 6 (six) hours as needed (for pain).   Yes [provider]  amantadine (SYMMETREL) 100 MG capsule Take 100 mg by mouth 2 (two) times daily.    Yes [provider]  amoxicillin (AMOXIL) 500 MG capsule Take 2 capsules (1,000 mg total) by mouth 3 (three) times daily. 05/09/20  Yes Fransico Meadow, PA-C  aspirin 325 MG tablet Take 1 tablet (325 mg total) by mouth daily. 07/16/18  Yes Barton Dubois, MD  atorvastatin (LIPITOR) 40 MG tablet Take 40 mg by mouth daily.   Yes [provider]  benzonatate (TESSALON) 100 MG capsule Take 100 mg by mouth 3 (three) times daily. 05/02/20  Yes [provider]  diltiazem (CARDIZEM CD) 120 MG 24 hr capsule Take 1 capsule (120 mg total) by mouth daily. Patient taking differently: Take 120 mg by mouth daily after breakfast.  09/22/18  Yes Soyla Dryer, PA-C  doxycycline (VIBRA-TABS) 100 MG tablet Take 1 tablet (100 mg total) by mouth 2 (two) times daily. 05/09/20  Yes Fransico Meadow, PA-C  Ferrous Sulfate (IRON PO) Take 65 mg by mouth 2 (two) times daily with a meal.   Yes [provider]  loratadine (CLARITIN) 10 MG tablet Take 10 mg by mouth daily. 05/02/20  Yes  [provider]  Multiple Vitamin (MULTIVITAMIN WITH MINERALS) TABS tablet Take 1 tablet by mouth daily after breakfast.    Yes [provider]  pantoprazole (PROTONIX) 40 MG tablet Take 1 tablet (40 mg total) by mouth 2 (two) times daily before a meal. 09/22/18  Yes Soyla Dryer, PA-C  sulfamethoxazole-trimethoprim (BACTRIM DS) 800-160 MG tablet Take 1 tablet by mouth 2 (two) times daily. 5 day course prescribed on 05/04/2020 05/04/20  Yes [provider]  tamsulosin (FLOMAX) 0.4 MG CAPS capsule Take 0.4 mg by mouth daily. 05/04/20  Yes [provider]  diltiazem (CARDIZEM) 120 MG tablet Take 120 mg by mouth daily. Patient not taking: Reported on 05/11/2020 05/02/20   [provider]    Inpatient Medications: Scheduled Meds: . bisacodyl  10 mg Oral Daily  . chlorhexidine  15 mL Mouth Rinse BID  . feeding supplement (PROSource TF)  45 mL Per Tube BID  . free water  200 mL Per Tube Q3H  . ipratropium-albuterol  3 mL Nebulization BID  . mouth rinse  15 mL Mouth Rinse q12n4p  . pantoprazole (PROTONIX) IV  40 mg Intravenous Q12H  . sennosides  5 mL Per Tube QHS   Continuous Infusions: . sodium chloride 70 mL/hr at 05/16/20 2122  .  sodium chloride    . diltiazem (CARDIZEM) infusion 15 mg/hr (05/24/20 0814)  . feeding supplement (OSMOLITE 1.5 CAL) 1,000 mL (05/23/20 2049)  . fluconazole (DIFLUCAN) IV 400 mg (05/24/20 0807)  . piperacillin-tazobactam (ZOSYN)  IV 3.375 g (05/24/20 0804)  . vancomycin 1,500 mg (05/24/20 1021)  . vancomycin     PRN Meds: sodium chloride, Place/Maintain arterial line **AND** sodium chloride, [DISCONTINUED] acetaminophen **OR** acetaminophen, haloperidol lactate, hydrALAZINE, ipratropium-albuterol, metoprolol tartrate, ondansetron (ZOFRAN) IV  Allergies:   No Known Allergies  Social History:   Social History   Socioeconomic History  . Marital status: Single    Spouse name: Not on file  . Number of children: Not  on file  . Years of education: Not on file  . Highest education level: Not on file  Occupational History  . Not on file  Tobacco Use  . Smoking status: Former Smoker    Packs/day: 1.00    Years: 40.00    Pack years: 40.00    Types: Cigarettes    Quit date: 06/2018    Years since quitting: 1.9  . Smokeless tobacco: Never Used  Vaping Use  . Vaping Use: Never used  Substance and Sexual Activity  . Alcohol use: Not Currently    Alcohol/week: 12.0 standard drinks    Types: 12 Cans of beer per week    Comment: none since 06/2018  . Drug use: Not Currently    Types: Cocaine    Comment: last cocaine use 07/12/18  . Sexual activity: Yes  Other Topics Concern  . Not on file  Social History Narrative  . Not on file   Social Determinants of Health   Financial Resource Strain:   . Difficulty of Paying Living Expenses:   Food Insecurity:   . Worried About Charity fundraiser in the Last Year:   . Arboriculturist in the Last Year:   Transportation Needs:   . Film/video editor (Medical):   Marland Kitchen Lack of Transportation (Non-Medical):   Physical Activity:   . Days of Exercise per Week:   . Minutes of Exercise per Session:   Stress:   . Feeling of Stress :   Social Connections:   . Frequency of Communication with Friends and Family:   . Frequency of Social Gatherings with Friends and Family:   . Attends Religious Services:   . Active Member of Clubs or Organizations:   . Attends Archivist Meetings:   Marland Kitchen Marital Status:   Intimate Partner Violence:   . Fear of Current or Ex-Partner:   . Emotionally Abused:   Marland Kitchen Physically Abused:   . Sexually Abused:     Family History:   Family History  Problem Relation Age of Onset  . Hypertension Father   . Heart attack Father   . Heart disease Father   . Hypertension Mother   . GER disease Mother   . Thyroid disease Mother   . Colon cancer Neg Hx   . Colon polyps Neg Hx      ROS:  Please see the history of present  illness.  All other ROS reviewed and negative.     Physical Exam/Data:   Vitals:   05/24/20 0619 05/24/20 0659 05/24/20 0823 05/24/20 0907  BP: 126/88 (!) 129/94 129/75   Pulse:   (!) 120   Resp:   20   Temp:   98.9 F (37.2 C)   TempSrc:   Oral   SpO2:   100% 100%  Weight:      Height:        Intake/Output Summary (Last 24 hours) at 05/24/2020 1104 Last data filed at 05/24/2020 0814 Gross per 24 hour  Intake 2859.89 ml  Output 1960 ml  Net 899.89 ml   Last 3 Weights 05/23/2020 05/22/2020 05/17/2020  Weight (lbs) 144 lb 10 oz 141 lb 1.5 oz 140 lb 3.4 oz  Weight (kg) 65.6 kg 64 kg 63.6 kg     Body mass index is 21.36 kg/m.  General:  Well nourished, well developed, in no acute distress HEENT: normal Lymph: no adenopathy Neck: no JVD Endocrine:  No thryomegaly Vascular: No carotid bruits; FA pulses 2+ bilaterally without bruits  Cardiac:  normal S1, S2; tachycardic; no murmur  Lungs:  clear to auscultation bilaterally, no wheezing, rhonchi or rales  Abd: soft, nontender, no hepatomegaly  Ext: no edema Musculoskeletal:  No deformities, BUE and BLE strength normal and equal Skin: warm and dry  Neuro:  CNs 2-12 intact, no focal abnormalities noted Psych:  Normal affect   EKG:  The EKG was personally reviewed and demonstrates:  Afib RVR, 131 bpm Telemetry:  Telemetry was personally reviewed and demonstrates:  Initial SVT vs afib with rates up to 180s>> afib HR 140s now improving, currently 120-130s  Relevant CV Studies:  Echo 05/11/20  1. Left ventricular ejection fraction, by estimation, is 65 to 70%. The  left ventricle has normal function. The left ventricle has no regional  wall motion abnormalities. Left ventricular diastolic parameters are  consistent with Grade I diastolic  dysfunction (impaired relaxation).  2. Right ventricular systolic function is normal. The right ventricular  size is normal. Tricuspid regurgitation signal is inadequate for assessing  PA  pressure.  3. The mitral valve is grossly normal. Trivial mitral valve  regurgitation.  4. The aortic valve is tricuspid. Aortic valve regurgitation is not  visualized.  5. The inferior vena cava is normal in size with greater than 50%  respiratory variability, suggesting right atrial pressure of 3 mmHg.   Laboratory Data:  High Sensitivity Troponin:   Recent Labs  Lab 05/10/20 2111 05/10/20 2232  TROPONINIHS 12 12     Chemistry Recent Labs  Lab 05/22/20 0418 05/23/20 0436 05/24/20 0342  NA 139 138 135  K 3.9 4.0 4.1  CL 104 104 105  CO2 25 24 20*  GLUCOSE 157* 153* 170*  BUN 13 16 11   CREATININE 0.77 0.78 0.85  CALCIUM 8.8* 8.6* 8.1*  GFRNONAA >60 >60 >60  GFRAA >60 >60 >60  ANIONGAP 10 10 10     Recent Labs  Lab 05/19/20 2026 05/20/20 0524  ALBUMIN 1.8* 1.9*   Hematology Recent Labs  Lab 05/23/20 0436 05/23/20 1930 05/24/20 0342  WBC 21.6* 24.1* 25.7*  RBC 2.37* 3.23* 3.30*  HGB 6.3* 9.0* 9.2*  HCT 20.9* 27.6* 28.3*  MCV 88.2 85.4 85.8  MCH 26.6 27.9 27.9  MCHC 30.1 32.6 32.5  RDW 15.2 14.6 14.9  PLT 582* 506* 488*   BNPNo results for input(s): BNP, PROBNP in the last 168 hours.  DDimer No results for input(s): DDIMER in the last 168 hours.   Radiology/Studies:  DG CHEST PORT 1 VIEW  Result Date: 05/22/2020 CLINICAL DATA:  59 year old male with perforated esophagus status post surgical exploration, esophageal stent placement and percutaneous gastrostomy tube placement postoperative day 5. Shortness of breath. EXAM: PORTABLE CHEST 1 VIEW COMPARISON:  Portable chest 0535 hours today and earlier. FINDINGS: Portable AP semi upright view at 2330 hours.  Stable mediastinal contours. Esophageal stent configuration appears stable since 05/21/2020. But there is increasing confluent right lung base opacity. A single right right chest tube remains in place. No pneumothorax identified. The left lung remains clear allowing for portable technique. Visualized  tracheal air column is within normal limits. IMPRESSION: 1. Increasing right lung base opacity since yesterday, indeterminate for pleural fluid versus consolidation. 2. Single right chest tube remains in place, no pneumothorax. 3. Esophageal stent configuration appears stable. Electronically Signed   By: Genevie Ann M.D.   On: 05/22/2020 23:39   DG CHEST PORT 1 VIEW  Result Date: 05/22/2020 CLINICAL DATA:  Follow-up pneumothorax EXAM: PORTABLE CHEST 1 VIEW COMPARISON:  05/21/2020 FINDINGS: Cardiac shadow is stable. Esophageal stent is again noted and stable. Right thoracostomy catheter is seen without evidence of pneumothorax. Right basilar atelectasis and small effusion are seen. The left lung is clear. IMPRESSION: No pneumothorax is noted. Small right pleural effusion and basilar atelectasis. Electronically Signed   By: Inez Catalina M.D.   On: 05/22/2020 08:03   DG Chest Port 1 View  Result Date: 05/21/2020 CLINICAL DATA:  Postop from repair of esophageal rupture. EXAM: PORTABLE CHEST 1 VIEW COMPARISON:  05/20/2020 FINDINGS: An endoluminal stent is seen within the thoracic esophagus. 2 right-sided chest tubes remain in place. Small approximately 10% right pneumothorax is noted, which appears slightly increased in size since previous study. Bibasilar atelectasis is again seen, right side greater than left, without significant change. Mild cardiomegaly remains stable. IMPRESSION: Small approximately 10% right pneumothorax, slightly increased in size since prior study. Stable bibasilar atelectasis, right side greater than left. Electronically Signed   By: Marlaine Hind M.D.   On: 05/21/2020 08:07   DG ESOPHAGUS W SINGLE CM (SOL OR THIN BA)  Result Date: 05/23/2020 CLINICAL DATA:  Postop esophageal perforation 05/17/2020. EXAM: ESOPHOGRAM/BARIUM SWALLOW TECHNIQUE: Single contrast examination was performed using  water-soluble. FLUOROSCOPY TIME:  Fluoroscopy Time:  1 minutes 36 seconds Radiation Exposure Index  (if provided by the fluoroscopic device): Number of Acquired Spot Images: 5 COMPARISON:  CT chest 05/16/2020. FINDINGS: In the recumbent LPO position, patient drank water soluble Omnipaque 300. There is a long esophageal wall stent centered in the mid esophagus. Leakage of contrast from the right posterolateral wall of the distal esophagus, in the region of the distal aspect of the wall stent, is seen with filling of the right pleural space. IMPRESSION: Persistent leak from the right posterolateral aspect of the esophagus, along the distal aspect of the stent, with filling of the adjacent right pleural space. Electronically Signed   By: Lorin Picket M.D.   On: 05/23/2020 10:05     Assessment and Plan:   New onset afib in the setting of multiple complicated medical issues - This morning patient went into Afib RVR with HS into the 150s. Pt reported sob, lightheadedness and dizziness.  - Administered IV dilt 15mg  once followed by bolus and infusion - Pt had prior stroke in 2019 suspected cardioembolic source however also known acute cocaine intoxication could present with a stroke.  - CHADSVASC = 3 (CVA, HTN) IV heparin held for recent hematemesis. Hgb 9.2 today. Suspect patient will need anticoagulation long term, esp given prior stroke, however is not a candidate at this moment - Echo 05/11/20 showed LVEF 65-70%, G1DD, trivial MR -  TSH normal. Keep Mag >2 and K+>4 - Pt converted to sinus tach. Can consolidate IV dilt to PO when he is npo - Patient might need IV heparin during  hospitalization. Will discuss this with MD. Pressures stable.   Esophageal rupture with rt sided hydropneumothorax complicated by rt sided complex effusion - NPO and PEG tube inplace - thoracic surgery following, plan CT today  Sepsis 2/2 to aspiration PNA/Acute respiratory failure  - WBC worsening - IV abx per IM  Acute blood loss Anemia - Hgb baseline around 11  - Hgb down to 6.3 on 8/11 and s/p 2 units PRBCs. Hgb  9.2 this AM  Prior stroke - pta aspirin 325mg  daily and lipitor 40 mg daily - aspirin held for anemia  For questions or updates, please contact [facility] Please consult [email or web address] for contact info under    Signed, Cadence Ninfa Meeker, PA-C  05/24/2020 11:04 AM   History and all data above reviewed.  Patient examined.  We are asked to see the patient for evaluation of atrial fibrillation.  He went into this rhythm earlier this morning.  He was prior to that having sinus tachycardia with frequent PACs.  He actually currently is back in sinus rhythm.  He has had briefly on IV Cardizem.  He is currently not on anticoagulation with his complicated history recently including GI blood loss.  The patient himself reports that prior to getting sick he was ambulating a little bit.  He is slowly getting around since his previous stroke.  He walks typically with a walker or sometimes can ambulate short distances without.  He lives with his mom but he goes to see his sons.  He has not had any chest pressure, neck or arm discomfort.  He does not notice any palpitations, presyncope or syncope.  He has no PND or orthopnea.  He has had no weight gain or edema.  I agree with the findings as above.  The patient exam reveals COR:RRR  ,  Lungs: Decreased breath sounds  ,  Abd: Positive bowel sounds, no rebound no guarding, Ext No edema  .  All available labs, radiology testing, previous records reviewed. Agree with documented assessment and plan.   Atrial fib: The patient has paroxysmal atrial fibrillation with currently is in sinus rhythm.  We can convert him from Cardizem IV to p.o.  He is not an anticoagulation candidate.  However, we did look back at his previously admitted which was thought possibly to be embolic.  Eventually I think this should be on long-term NOAC when he is thought to be a reasonable risk from the standpoint of his acute problems.  HTN: Blood pressure is mildly elevated and can be treated in the  context of managing his paroxysmal atrial fibrillation with the addition of Cardizem.  Jeneen Rinks Renell Coaxum  3:30 PM  05/24/2020

## 2020-05-24 NOTE — Progress Notes (Addendum)
PROGRESS NOTE  Evan Chavez  DOB: July 15, 1961  PCP: Rosita Fire, MD UJW:[id number]  DOA: 05/10/2020  LOS: 13 days   Chief Complaint  Patient presents with  . Shortness of Breath   Brief narrative: Evan Chavez is a 59 y.o. male with PMH of GERD, Barrett's esophagus, peptic ulcer disease with extensive gastric erosion, duodenal ulcer and esophageal stricture, also with history of prior stroke, hyperlipidemia, BPH, remote to cocaine abuse. He was hospitalized November 2019 with bibasal pneumonia and imaging showed esophageal stricture and impaired esophageal motility. EGD confirmed severe reflux esophagitis with peptic ulcer disease with extensive gastric erosions and duodenal ulcer.  Biopsy revealed Barrett's esophagus. He had a repeat EGD 12/2018 with balloon dilatation for peptic stricture, his dysphagia improved and surveillance EGD was advised.   He presented to the ED at [facility] on 7/28 with generalized weakness, productive cough and low-grade fever.   Chest x-ray showed bibasilar pneumonia, treated with amoxicillin and doxycycline.   He returned to the ED 7/29 due to increased shortness of breath.  Labs showed leukocytosis mild lactic acidosis.  CT angiogram of the chest showed bibasal consolidation and pleural effusions .  He was started on  ceftriaxone and azithromycin and admitted to hospital service.  8/2, patient had a successful right thoracentesis with 30 mL of cloudy yellow right pleural fluid drainage. Chest x-rays obtained between 8/2-8/4 showed worsening right-sided hydropneumothorax with possible lung collapse. 8/4, CT chest showed esophageal perforation arising from the right lateral aspect of the mid to distal esophagus and large right-sided hydropneumothorax.  It also showed findings consistent with aspiration pneumonia. 8/4 patient underwent right-sided chest tube placement.  He was transferred from [facility] to [facility] for CT surgery  evaluation. 8/5, patient underwent right video-assisted thoracoscopy, decortication, EGD, PEG tube placement, esophageal stent placement with a 25 X 125 mm stent by CT surgery. Patient was subsequently transferred hospitalist service.  Subjective: Patient was seen and examined this morning.   Does not complain but patient looks lethargic, as celebrating.  Remains in A. fib with RVR.  Cardizem infusion was started last night.  For last 48 hours, patient has remained tachycardic, tachypneic. Esophagogram obtained on 8/11 continues to show perforated esophagus draining to right pleural space.  Assessment/Plan: Esophageal rupture leading to right-sided hydropneumothorax Right-sided complex loculated parapneumonic effusion, freely flowing effusion on left -Diagnosed on 8/4.  Seen by thoracic surgery and GI. -8/5, patient underwent right sided VATS, esophageal stent placement, PEG tube placement -8/9, large bore chest tube removed.  Continues to have JP drain in place. -8/10, patient had an episode of hematemesis  -8/11, repeat esophagogram showed persistently leakage from esophagus to right pleural space.  Currently remains n.p.o. PEG tube in place. -Noted a plan from thoracic surgery to obtain CT scan today to evaluate for undrained fluid collections.  Tentative plan for OR tomorrow with gastroenterology for possible dilation and stent placement.  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  However for last 48 hours, patient is tachycardic, tachypneic, WBC count is worsening secondary to persistent esophageal perforation. -Currently on IV Zosyn and IV fluconazole.  I also added IV vancomycin today. -Continue to monitor.  Both selection. -It is unclear if patient had A. fib in the past but he was on oral Cardizem. -Currently in A. fib with RVR secondary to  mediastinitis related to esophageal rupture. -Has been on Cardizem drip maxed out at  15 mg/h.  Oral Cardizem on hold because of n.p.o. status. -Cardiology consultation called.  Acute blood loss anemia  -He has longstanding history of reflux esophagitis, esophageal stricture and underwent recently stenting. -Hemoglobin level more than 11 at baseline.  It has been low in this hospitalization secondary to esophageal rupture and surgery.  -8/11 hemoglobin was at the lowest at 6.3.  10 units of PRBC transfused.  Globin 9.2 this morning.  Continue to monitor. Recent Labs    05/16/20 0519 05/17/20 2036 05/18/20 0341 05/19/20 0129 05/20/20 0524 05/21/20 0832 05/22/20 0418 05/23/20 0436 05/23/20 1930 05/24/20 0342  HGB 8.5* 9.9* 7.8* 7.1* 7.0* 7.8* 7.8* 6.3* 9.0* 9.2*   Acute respiratory with hypoxia -Secondary to pneumonia, hydropneumothorax, effusion, anemia, persistent esophageal rupture. -Currently on 4 L oxygen by nasal cannula.  Continue to wean down as tolerated.  Acute delirium -Patient has had episodes of confusion likely secondary to sepsis, prolonged hospitalization and previous stroke. -Currently slow to respond, oriented to place but not restless or agitated.  Continue reorientation efforts. -As needed IV Haldol.  Left hydronephrosis and needs ESWL -Noted in CT scan obtained while in the hospital.  Seen by urology. Lithotripsy was scheduled on 8/10 but patient is to remain in the hospital. -Patient will follow up with Urology on discharge.  Prior history of stroke -Prior to admission, patient was on aspirin 325 mg daily and Lipitor 40 mg daily. -Aspirin is currently on hold.  Will resume Lipitor post discharge.  Mobility: PT eval obtained.  CIR recommended. Code Status:   Code Status: Full Code  Nutritional status: Body mass index is 21.36 kg/m. Nutrition Problem: Moderate Malnutrition Etiology: chronic illness (Barrett's esophagus, GERD) Signs/Symptoms: moderate  fat depletion, moderate muscle depletion Diet Order            Diet NPO time specified  Diet effective midnight           Diet NPO time specified  Diet effective now                 DVT prophylaxis: Place and maintain sequential compression device Start: 05/23/20 1040 SCDs Start: 05/11/20 0134   Antimicrobials:  IV Zosyn, fluconazole IV, vancomycin IV Fluid: Not on IV fluid  Consultants: CT surgery, cardiology Family Communication:  None at bedside  Incision (Closed) 05/17/20 Chest Right (Active)  Date First Assessed/Time First Assessed: 05/17/20 1821   Location: Chest  Location Orientation: Right    Assessments 05/17/2020  6:55 PM 05/22/2020  8:00 PM  Dressing Type Pressure dressing Gauze (Comment)  Dressing Clean;Dry;Intact Clean;Dry;Intact  Site / Wound Assessment Dressing in place / Unable to assess Dressing in place / Unable to assess  Drainage Amount None --     No Linked orders to display     Pressure Injury 05/17/20 Buttocks Right Stage 2 -  Partial thickness loss of dermis presenting as a shallow open injury with a red, pink wound bed without slough. (Active)  Date First Assessed/Time First Assessed: 05/17/20 0800   Location: Buttocks  Location Orientation: Right  Staging: Stage 2 -  Partial thickness loss of dermis presenting as a shallow open injury with a red, pink wound bed without slough.    Assessments 05/18/2020  8:00 AM 05/22/2020  8:13 AM  Dressing Type Foam - Lift dressing to assess site every shift --  Dressing Clean;Dry;Intact --  Drainage Amount -- None     No Linked orders to display     Wound / Incision (Open or Dehisced) 05/19/20  Skin tear Buttocks Left (Active)  Date First Assessed/Time First Assessed: 05/19/20 1707   Wound Type: Skin tear  Location: Buttocks  Location Orientation: Left    Assessments 05/20/2020  8:30 AM 05/22/2020  8:00 PM  Dressing Type Foam - Lift dressing to assess site every shift None  Dressing Status Clean;Dry;Intact --  Site /  Wound Assessment -- Clean;Dry  Peri-wound Assessment Intact --     No Linked orders to display    Status is: Inpatient  Remains inpatient appropriate because:Ongoing diagnostic testing needed not appropriate for outpatient work up and IV treatments appropriate due to intensity of illness or inability to take PO   Dispo: The patient is from: Home              Anticipated d/c is to: SNF  Vs CIR              Anticipated d/c date is: More than 3 days              Patient currently is not medically stable to d/c.   Infusions:  . sodium chloride 70 mL/hr at 05/16/20 2122  . sodium chloride    . diltiazem (CARDIZEM) infusion 15 mg/hr (05/24/20 0814)  . feeding supplement (OSMOLITE 1.5 CAL) 1,000 mL (05/23/20 2049)  . fluconazole (DIFLUCAN) IV 400 mg (05/24/20 0807)  . piperacillin-tazobactam (ZOSYN)  IV 3.375 g (05/24/20 0804)  . vancomycin 1,500 mg (05/24/20 1021)  . vancomycin      Scheduled Meds: . bisacodyl  10 mg Oral Daily  . chlorhexidine  15 mL Mouth Rinse BID  . feeding supplement (PROSource TF)  45 mL Per Tube BID  . free water  200 mL Per Tube Q3H  . ipratropium-albuterol  3 mL Nebulization BID  . mouth rinse  15 mL Mouth Rinse q12n4p  . pantoprazole (PROTONIX) IV  40 mg Intravenous Q12H  . sennosides  5 mL Per Tube QHS    Antimicrobials: Anti-infectives (From admission, onward)   Start     Dose/Rate Route Frequency Ordered Stop   05/24/20 2100  vancomycin (VANCOREADY) IVPB 750 mg/150 mL     Discontinue     750 mg 150 mL/hr over 60 Minutes Intravenous Every 12 hours 05/24/20 0813     05/24/20 0900  vancomycin (VANCOREADY) IVPB 1500 mg/300 mL     Discontinue     1,500 mg 150 mL/hr over 120 Minutes Intravenous  Once 05/24/20 0813     05/17/20 0900  fluconazole (DIFLUCAN) IVPB 400 mg     Discontinue     400 mg 100 mL/hr over 120 Minutes Intravenous Every 24 hours 05/17/20 0749     05/17/20 0600  vancomycin (VANCOREADY) IVPB 750 mg/150 mL  Status:  Discontinued         750 mg 150 mL/hr over 60 Minutes Intravenous Every 8 hours 05/16/20 2154 05/17/20 0903   05/17/20 0400  vancomycin (VANCOREADY) IVPB 750 mg/150 mL  Status:  Discontinued        750 mg 150 mL/hr over 60 Minutes Intravenous Every 8 hours 05/16/20 1945 05/16/20 2154   05/16/20 2200  vancomycin (VANCOREADY) IVPB 1250 mg/250 mL        1,250 mg 166.7 mL/hr over 90 Minutes Intravenous STAT 05/16/20 2153 05/16/20 2348   05/16/20 2000  vancomycin (VANCOREADY) IVPB 1250 mg/250 mL  Status:  Discontinued        1,250 mg 166.7 mL/hr over 90 Minutes Intravenous  Once 05/16/20 1945 05/16/20 2153   05/15/20  1600  piperacillin-tazobactam (ZOSYN) IVPB 3.375 g     Discontinue     3.375 g 12.5 mL/hr over 240 Minutes Intravenous Every 8 hours 05/15/20 0945     05/15/20 0930  piperacillin-tazobactam (ZOSYN) IVPB 3.375 g        3.375 g 100 mL/hr over 30 Minutes Intravenous  Once 05/15/20 0921 05/15/20 1009   05/13/20 2000  azithromycin (ZITHROMAX) tablet 500 mg        500 mg Oral Daily 05/13/20 1155 05/14/20 2127   05/10/20 2200  cefTRIAXone (ROCEPHIN) 2 g in sodium chloride 0.9 % 100 mL IVPB  Status:  Discontinued        2 g 200 mL/hr over 30 Minutes Intravenous Every 24 hours 05/10/20 2148 05/15/20 0911   05/10/20 2200  azithromycin (ZITHROMAX) 500 mg in sodium chloride 0.9 % 250 mL IVPB  Status:  Discontinued        500 mg 250 mL/hr over 60 Minutes Intravenous Every 24 hours 05/10/20 2148 05/13/20 1155      PRN meds: sodium chloride, Place/Maintain arterial line **AND** sodium chloride, [DISCONTINUED] acetaminophen **OR** acetaminophen, haloperidol lactate, hydrALAZINE, ipratropium-albuterol, metoprolol tartrate, ondansetron (ZOFRAN) IV   Objective: Vitals:   05/24/20 0823 05/24/20 0907  BP: 129/75   Pulse: (!) 120   Resp: 20   Temp: 98.9 F (37.2 C)   SpO2: 100% 100%    Intake/Output Summary (Last 24 hours) at 05/24/2020 1056 Last data filed at 05/24/2020 0814 Gross per 24 hour  Intake  2859.89 ml  Output 2360 ml  Net 499.89 ml   Filed Weights   05/17/20 1425 05/22/20 0320 05/23/20 0427  Weight: 63.6 kg 64 kg 65.6 kg   Weight change:  Body mass index is 21.36 kg/m.   Physical Exam: General exam: Appears calm and comfortable but is breathing shallow.  Seems tired Skin: No rashes, lesions or ulcers. HEENT: Atraumatic, normocephalic, supple neck, no obvious bleeding Lungs: Clear to auscultation bilaterally.  Right thorax JP drain in place CVS: Tachycardic, no murmur  GI/Abd soft, nontender, nondistended, bowel sound present.  PEG tube site intact CNS: Alert, awake, oriented to place only.  Slow to respond.  Facial deviation to the left reportedly at baseline from previous stroke.  Not restless or agitated. Psychiatry: Depressed look Extremities: No pedal edema, no calf tenderness  Data Review: I have personally reviewed the laboratory data and studies available.  Recent Labs  Lab 05/20/20 0524 05/20/20 0524 05/21/20 0832 05/22/20 0418 05/23/20 0436 05/23/20 1930 05/24/20 0342  WBC 12.3*   < > 13.2* 21.1* 21.6* 24.1* 25.7*  NEUTROABS 10.4*  --  11.3* 18.8* 19.0*  --  22.7*  HGB 7.0*   < > 7.8* 7.8* 6.3* 9.0* 9.2*  HCT 23.5*   < > 25.8* 25.4* 20.9* 27.6* 28.3*  MCV 87.4   < > 86.0 87.3 88.2 85.4 85.8  PLT 489*   < > 521* 571* 582* 506* 488*   < > = values in this interval not displayed.   Recent Labs  Lab 05/19/20 2026 05/19/20 2026 05/20/20 0524 05/21/20 0832 05/22/20 0418 05/23/20 0436 05/23/20 0844 05/24/20 0342  NA 147*   < > 144 139 139 138  --  135  K 3.6   < > 4.5 3.7 3.9 4.0  --  4.1  CL 114*   < > 110 106 104 104  --  105  CO2 25   < > 22 23 25 24   --  20*  GLUCOSE 133*   < >  139* 147* 157* 153*  --  170*  BUN 11   < > 10 11 13 16   --  11  CREATININE 0.85   < > 0.90 0.73 0.77 0.78  --  0.85  CALCIUM 8.0*   < > 8.2* 8.4* 8.8* 8.6*  --  8.1*  MG 2.0  --  1.7 1.7  --   --  1.8 2.1  PHOS <1.0*  --  3.3 3.5  --   --   --   --    < > =  values in this interval not displayed.   Lab Results  Component Value Date   HGBA1C 5.5 07/12/2018       Component Value Date/Time   CHOL 124 07/13/2018 0524   TRIG 82 07/13/2018 0524   HDL 31 (L) 07/13/2018 0524   CHOLHDL 4.0 07/13/2018 0524   VLDL 16 07/13/2018 0524   LDLCALC 77 07/13/2018 0524    Signed, Terrilee Croak, MD [facility] Pager: [phone] (Secure Chat preferred). 05/24/2020

## 2020-05-24 NOTE — Progress Notes (Addendum)
[address]       [place] [address]             [phone]       7 Days Post-Op Procedure(s) (LRB): RIGHT VIDEO ASSISTED THORACOSCOPY (VATS)/DECORTICATION (Right) ESOPHAGEAL STENT PLACEMENT AND  PEG TUBE PLACEMENT (N/A) ESOPHAGOGASTRODUODENOSCOPY (EGD) (N/A) Subjective:  Events of last evening and early morning noted.  Developed respiratory distress related to poorly mobilized secretions that improved with cough, NT suctioning. Developed atrial fibrillation early this AM with RVR, currently on diltiazem infusion.  Awake and alert, nods to questions. Denies shortness of breath now.   Objective: Vital signs in last 24 hours: Temp:  [98.3 F (36.8 C)-101.2 F (38.4 C)] 98.9 F (37.2 C) (08/12 0823) Pulse Rate:  [111-124] 120 (08/12 0823) Cardiac Rhythm: Atrial fibrillation;Other (Comment) (08/12 0700) Resp:  [20-30] 20 (08/12 0823) BP: (120-144)/(75-94) 129/75 (08/12 0823) SpO2:  [97 %-100 %] 100 % (08/12 0823)     Intake/Output from previous day: 08/11 0701 - 08/12 0700 In: 3184.9 [I.V.:26.3; Blood:1230.4; NG/GT:1551.3; IV Piggyback:376.9] Out: 2310 [Urine:2250; Drains:60] Intake/Output this shift: Total I/O In: -  Out: 50 [Drains:50]  General appearance: alert, cooperative and mild distress Neurologic: No gross deficits Heart: atrial fibrillation with VR 150-170/min. BP in 120's. Lungs: Coarse crackles bilaterally, improved from yesterday. The right Blake drain had ~73ml serous drainage past 24 hours.  Abdomen: Soft, NT, the PEG is secure and the site is intact and dry.  Extremities: all warm and well perfused. Wound: the right chest Blake drain is secure and the site is dry.   Lab Results: Recent Labs    05/23/20 1930 05/24/20 0342  WBC 24.1* 25.7*  HGB 9.0* 9.2*  HCT 27.6* 28.3*  PLT 506* 488*   BMET:  Recent Labs    05/23/20 0436 05/24/20 0342  NA 138 135  K 4.0 4.1  CL 104 105  CO2 24 20*  GLUCOSE 153* 170*  BUN 16 11    CREATININE 0.78 0.85  CALCIUM 8.6* 8.1*    PT/INR: No results for input(s): LABPROT, INR in the last 72 hours. ABG    Component Value Date/Time   PHART 7.402 05/17/2020 2036   HCO3 26.2 05/17/2020 2036   TCO2 27 05/17/2020 2036   O2SAT 94.0 05/17/2020 2036   CBG (last 3)  Recent Labs    05/23/20 2327 05/24/20 0517 05/24/20 0821  GLUCAP 144* 152* 156*    Assessment/Plan: S/P Procedure(s) (LRB): RIGHT VIDEO ASSISTED THORACOSCOPY (VATS)/DECORTICATION (Right) ESOPHAGEAL STENT PLACEMENT AND  PEG TUBE PLACEMENT (N/A) ESOPHAGOGASTRODUODENOSCOPY (EGD) (N/A)  -POD7: Right VATS for drainage of loculated effusionwithdecortication, EGD with esophageal stenting for perforation and PEG placement in pt with h/o Barrett's esophagus. Esophagram yesterday demonstrated persistent esophageal leak and tight stricture distal to the stent. Has worsening leukocytosis. Plan further imaging to r/o un-drained fluid collection. Dr. Kipp Brood has also discussed case with the GI service and repeat EGD is being arranged for tomorrow.  Continue NPO and nutrition support via PEG.  -Anemia Transfused 2 units PRBC's on 8/11 for Hgb 6.3with appropriate response to Hgb. No obvious ongoing losses but monitor closely  -New onset atrial fibrillation with RVR- mgt per primary team. On diltiazem infusion.K+ and Mg++ OK.    -Aspiration pneumonia on admission--Continue ABX / antifungal per primary team.   LOS: 13 days    Antony Odea , PA-C [phone] 05/24/2020   Agree with above. CT scan today to evaluate for undrained fluid collections. OR  tomorrow with gastroenterology for potential dilation and stent replacement.  Bre Pecina Bary Leriche

## 2020-05-24 NOTE — Consult Note (Signed)
[facility] Consult: 10:22 AM 05/24/2020  LOS: 13 days    Referring Provider: Dr Kipp Brood  Primary Care Physician:  Rosita Fire, MD Primary Gastroenterologist:  Dr. [name]    Reason for Consultation: Replace esophageal stent in pt s/p  esophageal perforation.   HPI: Rolla Kedzierski is a 59 y.o. male.  PMH stroke.  Severe GERD.  Barrett's esophagus.  Peptic ulcer disease.  Constipation, fecal impaction with manual disimpaction 08/2018.  Anemia, on oral iron.  Cocaine abuse, last ~ 06/2018.  Left thyroid nodule.  Bibasilar pneumonia 08/2018.   08/2018 EGD.  For evaluation of abnormal upper GI and abnormal CT.  Severe inflammation at mid/distal esophagus suspicious for infiltrating neoplasm but could be just severe benign inflammation, biopsied.  Biopsy of extensive gastric erosions and duodenal ulceration/erosions.  Query ischemic vs other process, may be multifactorial. 08/2018 esophagram: Mild, distal esophageal thickening, focal narrowing at distal esophagus. Diffusely impaired esophageal motility. Laryngeal penetration without gross aspiration. 12/13/2018 EGD.  Dr. [name].  Ulcer surveillance and c/o dysphagia.  Peptic appearing stricture at 30 cm from incisors at distal esophagus was balloon dilated.  5 cm segment of abnormal appearing salmon-colored epithelium.  Moderate hiatal hernia.  Antral and bulbar erythema, previous ulcers completely healed.   04/07/2019 colonoscopy.  Dr. Manus Rudd.  For evaluation IDA.  3, 4 to 8 mm, polyps (TAs) in transverse colon cold snare removed.  Nonbleeding internal hemorrhoids.   Repeat colon and EGD planned 2023.   Chronic home meds include Protonix 40 mg bid, ferrous sulfate 65 mg bid, ASA 325/day.  Doing well at GI OV of 12/13/19  Seen in [facility], ED 7/28 with cough  and prescribed amoxicillin, doxycycline for pneumonia. 7/29 return to [facility] and admitted with worsening shortness of breath, bibasilar pneumonia, loculated effusions.  Transferred to [facility] hospital 7/29.   Developed abdominal pain and 8/1 CTAP showed left ureteral stone with obstruction, worsening of LLL effusions, air space disease.  Incidental note of HH and patulous distal esophagus. 05/14/2020 30 mL right thoracentesis. 05/14/2020 CXR following thoracentesis showed probable right pneumothorax. 05/14/2020 modified barium swallow: Mild OP dysphagia.  No aspiration/penetration but felt to be at mild risk of aspiration and cleared for regular diet with thin liquids. 05/16/2020 CXR w right hydropneumothorax with increased right lung collapse S/p bil chest tubes, a right Blake drain remains. 05/16/2020 CT chest revealed findings c/w esophageal perforation arising from right side of mid to distal esophagus, contrast tracking through the defect.  Large, right hydropneumothorax.  Interval aspiration with occluded right bronchi, likely developing aspiration pneumonia with lobar collapse.  Persistent diffuse esophageal wall thickening.  05/17/2020 video fluoroscopy with decortication, EGD, G-tube placement, placement 25 x 125 mm esophageal stent per Dr Kipp Brood    05/24/2011 esophagram: Persistent leak at distal aspect of stent Worsening leukocytosis despite IV diflucan, Zosyn, vanc.  Rapid response team called around 8 PM yesterday for increased respiratory rate and increased work of breathing.  He coughed up copious amounts of secretion and was orally suctioned. On Diltiazem as of 8/12 for new  onset Afib.  Cardiology involved.  Dr Kipp Brood planning 8/11 EGD and replacement of esophageal stent.    Received 2 PRBCs for Hgb 6.3 on 8/11.    Social history Lives alone, single.  Worked odd jobs prior to becoming ill.  No significant alcohol intake.  No recent cocaine use.  Family history Hypertension.  GERD in  his mother.  Thyroid disease.  Heart disease.  Past Medical History:  Diagnosis Date  . Cocaine abuse ([place])   . ETOH abuse   . GERD (gastroesophageal reflux disease)   . Hyperlipidemia   . Stroke (cerebrum) ([place]) 06/2018   right sided weakness    Past Surgical History:  Procedure Laterality Date  . BIOPSY  08/25/2018   Procedure: BIOPSY;  Surgeon: [name], MD;  [facility];  Service: Endoscopy;;  . BIOPSY  12/13/2018   Procedure: BIOPSY;  Surgeon: [name], MD;  [facility];  Service: Endoscopy;;  esophagus  . COLONOSCOPY WITH PROPOFOL N/A 04/07/2019   Dr. [name]: 3 Tubular adenomas removed (4 to 8 mm in size), next colonoscopy 3 years.  . ESOPHAGEAL STENT PLACEMENT N/A 05/17/2020   Procedure: ESOPHAGEAL STENT PLACEMENT AND  PEG TUBE PLACEMENT;  Surgeon: Lajuana Matte, MD;  Location: [place];  Service: Thoracic;  Laterality: N/A;  . ESOPHAGOGASTRODUODENOSCOPY N/A 05/17/2020   Procedure: ESOPHAGOGASTRODUODENOSCOPY (EGD);  Surgeon: Lajuana Matte, MD;  Location: [facility] OR;  Service: Thoracic;  Laterality: N/A;  . ESOPHAGOGASTRODUODENOSCOPY (EGD) WITH PROPOFOL N/A 08/25/2018   Dr. [name]: Severely inflamed abnormal appearing mid/distal esophagus.  Encroachment on lumen suspicious for infiltrating neoplasm located be all from severe benign inflammation, extensive gastric erosions and extensive duodenal ulcerations.  Query ischemic process versus other.  Biopsy revealed Barrett's esophagus but negative for malignancy and H. pylori.  . ESOPHAGOGASTRODUODENOSCOPY (EGD) WITH PROPOFOL N/A 12/13/2018   Dr. [name]: peptic stricture s/p balloon dilatation. abnormal esophageal mucosa, by c/w barrett's without dysplasia, previous PUD completely healed.   Marland Kitchen POLYPECTOMY  04/07/2019   Procedure: POLYPECTOMY;  Surgeon: [name], MD;  [facility];  Service: Endoscopy;;  colon  . VIDEO ASSISTED THORACOSCOPY (VATS)/DECORTICATION Right 05/17/2020    Procedure: RIGHT VIDEO ASSISTED THORACOSCOPY (VATS)/DECORTICATION;  Surgeon: Lajuana Matte, MD;  Location: [place];  Service: Thoracic;  Laterality: Right;    Prior to Admission medications   Medication Sig Start Date End Date Taking? Authorizing Provider  acetaminophen (TYLENOL) 500 MG tablet Take 1,000 mg by mouth every 6 (six) hours as needed (for pain).   Yes [provider]  amantadine (SYMMETREL) 100 MG capsule Take 100 mg by mouth 2 (two) times daily.    Yes [provider]  amoxicillin (AMOXIL) 500 MG capsule Take 2 capsules (1,000 mg total) by mouth 3 (three) times daily. 05/09/20  Yes Fransico Meadow, PA-C  aspirin 325 MG tablet Take 1 tablet (325 mg total) by mouth daily. 07/16/18  Yes Barton Dubois, MD  atorvastatin (LIPITOR) 40 MG tablet Take 40 mg by mouth daily.   Yes [provider]  benzonatate (TESSALON) 100 MG capsule Take 100 mg by mouth 3 (three) times daily. 05/02/20  Yes [provider]  diltiazem (CARDIZEM CD) 120 MG 24 hr capsule Take 1 capsule (120 mg total) by mouth daily. Patient taking differently: Take 120 mg by mouth daily after breakfast.  09/22/18  Yes Soyla Dryer, PA-C  doxycycline (VIBRA-TABS) 100 MG tablet Take 1 tablet (100 mg total) by mouth 2 (two) times  daily. 05/09/20  Yes Fransico Meadow, PA-C  Ferrous Sulfate (IRON PO) Take 65 mg by mouth 2 (two) times daily with a meal.   Yes [provider]  loratadine (CLARITIN) 10 MG tablet Take 10 mg by mouth daily. 05/02/20  Yes [provider]  Multiple Vitamin (MULTIVITAMIN WITH MINERALS) TABS tablet Take 1 tablet by mouth daily after breakfast.    Yes [provider]  pantoprazole (PROTONIX) 40 MG tablet Take 1 tablet (40 mg total) by mouth 2 (two) times daily before a meal. 09/22/18  Yes Soyla Dryer, PA-C  sulfamethoxazole-trimethoprim (BACTRIM DS) 800-160 MG tablet Take 1 tablet by mouth 2 (two) times daily. 5 day course prescribed on  05/04/2020 05/04/20  Yes [provider]  tamsulosin (FLOMAX) 0.4 MG CAPS capsule Take 0.4 mg by mouth daily. 05/04/20  Yes [provider]  diltiazem (CARDIZEM) 120 MG tablet Take 120 mg by mouth daily. Patient not taking: Reported on 05/11/2020 05/02/20   [provider]    Scheduled Meds: . bisacodyl  10 mg Oral Daily  . chlorhexidine  15 mL Mouth Rinse BID  . feeding supplement (PROSource TF)  45 mL Per Tube BID  . free water  200 mL Per Tube Q3H  . ipratropium-albuterol  3 mL Nebulization BID  . mouth rinse  15 mL Mouth Rinse q12n4p  . pantoprazole (PROTONIX) IV  40 mg Intravenous Q12H  . sennosides  5 mL Per Tube QHS   Infusions: . sodium chloride 70 mL/hr at 05/16/20 2122  . sodium chloride    . diltiazem (CARDIZEM) infusion 15 mg/hr (05/24/20 0814)  . feeding supplement (OSMOLITE 1.5 CAL) 1,000 mL (05/23/20 2049)  . fluconazole (DIFLUCAN) IV 400 mg (05/24/20 0807)  . piperacillin-tazobactam (ZOSYN)  IV 3.375 g (05/24/20 0804)  . vancomycin    . vancomycin     PRN Meds: sodium chloride, Place/Maintain arterial line **AND** sodium chloride, [DISCONTINUED] acetaminophen **OR** acetaminophen, haloperidol lactate, hydrALAZINE, ipratropium-albuterol, metoprolol tartrate, ondansetron (ZOFRAN) IV   Allergies as of 05/10/2020  . (No Known Allergies)    Family History  Problem Relation Age of Onset  . Hypertension Father   . Heart attack Father   . Heart disease Father   . Hypertension Mother   . GER disease Mother   . Thyroid disease Mother   . Colon cancer Neg Hx   . Colon polyps Neg Hx     Social History   Socioeconomic History  . Marital status: Single    Spouse name: Not on file  . Number of children: Not on file  . Years of education: Not on file  . Highest education level: Not on file  Occupational History  . Not on file  Tobacco Use  . Smoking status: Former Smoker    Packs/day: 1.00    Years: 40.00    Pack years: 40.00     Types: Cigarettes    Quit date: 06/2018    Years since quitting: 1.9  . Smokeless tobacco: Never Used  Vaping Use  . Vaping Use: Never used  Substance and Sexual Activity  . Alcohol use: Not Currently    Alcohol/week: 12.0 standard drinks    Types: 12 Cans of beer per week    Comment: none since 06/2018  . Drug use: Not Currently    Types: Cocaine    Comment: last cocaine use 07/12/18  . Sexual activity: Yes  Other Topics Concern  . Not on file  Social History Narrative  . Not  on file   Social Determinants of Health   Financial Resource Strain:   . Difficulty of Paying Living Expenses:   Food Insecurity:   . Worried About Charity fundraiser in the Last Year:   . Arboriculturist in the Last Year:   Transportation Needs:   . Film/video editor (Medical):   Marland Kitchen Lack of Transportation (Non-Medical):   Physical Activity:   . Days of Exercise per Week:   . Minutes of Exercise per Session:   Stress:   . Feeling of Stress :   Social Connections:   . Frequency of Communication with Friends and Family:   . Frequency of Social Gatherings with Friends and Family:   . Attends Religious Services:   . Active Member of Clubs or Organizations:   . Attends Archivist Meetings:   Marland Kitchen Marital Status:   Intimate Partner Violence:   . Fear of Current or Ex-Partner:   . Emotionally Abused:   Marland Kitchen Physically Abused:   . Sexually Abused:     REVIEW OF SYSTEMS: Constitutional: Has not been out of bed or walk in a couple of weeks. ENT:  No nose bleeds Pulm: Persistent productive cough.  Feels short of breath. CV:  No palpitations, no LE edema.  GU:  No hematuria, no frequency GI: See HPI.  Patient n.p.o. now for close to 2 weeks. Heme: No unusual or excessive bleeding or bruising Transfusions: Before yesterday's transfusion, had never received blood products. Neuro:  No headaches, no peripheral tingling or numbness.  No dizziness, no seizures, no syncope. Derm:  No itching, no  rash or sores.  Endocrine:  No sweats or chills.  No polyuria or dysuria Immunization: Not queried Travel:  None beyond local counties in last few months.    PHYSICAL EXAM: Vital signs in last 24 hours: Vitals:   05/24/20 0823 05/24/20 0907  BP: 129/75   Pulse: (!) 120   Resp: 20   Temp: 98.9 F (37.2 C)   SpO2: 100% 100%   Wt Readings from Last 3 Encounters:  05/23/20 65.6 kg  05/09/20 69.4 kg  12/13/19 68.2 kg    General: Ill-appearing, alert, comfortable, coughing Head: No facial asymmetry or swelling.  No signs of head trauma. Eyes: No scleral icterus.  No conjunctival pallor. Ears: Not hard of hearing Nose: No discharge or congestion Mouth: Torus palatinus.  Oral mucosa pink, moist, clear.  Tongue midline.  Edentulous. Neck: No JVD, no thyromegaly, no masses Lungs: Active phlegmatic cough, wet vocal quality.  Bilateral rhonchi. Heart: A. fib in 120s. Abdomen: Soft, nontender, nondistended.  No masses, HSM.  PEG tube positioned on right, bandage not removed for close inspection..   Rectal: Deferred Musc/Skeltl: No gross joint swelling or deformities. Extremities: Clubbing on the fingers Neurologic: Alert.  Oriented x3.  Moves all 4 limbs, strength not tested.  No tremors Skin: No gross lesions, rashes or sores.   Psych: Laconic.  Flat affect.  Intake/Output from previous day: 08/11 0701 - 08/12 0700 In: 3184.9 [I.V.:26.3; Blood:1230.4; NG/GT:1551.3; IV Piggyback:376.9] Out: 2310 [Urine:2250; Drains:60] Intake/Output this shift: Total I/O In: -  Out: 50 [Drains:50]  LAB RESULTS: Recent Labs    05/23/20 0436 05/23/20 1930 05/24/20 0342  WBC 21.6* 24.1* 25.7*  HGB 6.3* 9.0* 9.2*  HCT 20.9* 27.6* 28.3*  PLT 582* 506* 488*   BMET Lab Results  Component Value Date   NA 135 05/24/2020   NA 138 05/23/2020   NA 139 05/22/2020  K 4.1 05/24/2020   K 4.0 05/23/2020   K 3.9 05/22/2020   CL 105 05/24/2020   CL 104 05/23/2020   CL 104 05/22/2020   CO2 20  (L) 05/24/2020   CO2 24 05/23/2020   CO2 25 05/22/2020   GLUCOSE 170 (H) 05/24/2020   GLUCOSE 153 (H) 05/23/2020   GLUCOSE 157 (H) 05/22/2020   BUN 11 05/24/2020   BUN 16 05/23/2020   BUN 13 05/22/2020   CREATININE 0.85 05/24/2020   CREATININE 0.78 05/23/2020   CREATININE 0.77 05/22/2020   CALCIUM 8.1 (L) 05/24/2020   CALCIUM 8.6 (L) 05/23/2020   CALCIUM 8.8 (L) 05/22/2020   LFT No results for input(s): PROT, ALBUMIN, AST, ALT, ALKPHOS, BILITOT, BILIDIR, IBILI in the last 72 hours. PT/INR Lab Results  Component Value Date   INR 1.1 05/11/2020   INR 1.1 05/10/2020   INR 1.02 07/12/2018   Hepatitis Panel No results for input(s): HEPBSAG, HCVAB, HEPAIGM, HEPBIGM in the last 72 hours. C-Diff No components found for: CDIFF Lipase  No results found for: LIPASE  Drugs of Abuse     Component Value Date/Time   LABOPIA NONE DETECTED 04/01/2019 1448   COCAINSCRNUR NONE DETECTED 04/01/2019 1448   LABBENZ NONE DETECTED 04/01/2019 1448   AMPHETMU NONE DETECTED 04/01/2019 1448   THCU NONE DETECTED 04/01/2019 1448   LABBARB NONE DETECTED 04/01/2019 1448     RADIOLOGY STUDIES: DG CHEST PORT 1 VIEW  Result Date: 05/22/2020 CLINICAL DATA:  59 year old male with perforated esophagus status post surgical exploration, esophageal stent placement and percutaneous gastrostomy tube placement postoperative day 5. Shortness of breath. EXAM: PORTABLE CHEST 1 VIEW COMPARISON:  Portable chest 0535 hours today and earlier. FINDINGS: Portable AP semi upright view at 2330 hours. Stable mediastinal contours. Esophageal stent configuration appears stable since 05/21/2020. But there is increasing confluent right lung base opacity. A single right right chest tube remains in place. No pneumothorax identified. The left lung remains clear allowing for portable technique. Visualized tracheal air column is within normal limits. IMPRESSION: 1. Increasing right lung base opacity since yesterday, indeterminate for  pleural fluid versus consolidation. 2. Single right chest tube remains in place, no pneumothorax. 3. Esophageal stent configuration appears stable. Electronically Signed   By: Genevie Ann M.D.   On: 05/22/2020 23:39   DG ESOPHAGUS W SINGLE CM (SOL OR THIN BA)  Result Date: 05/23/2020 CLINICAL DATA:  Postop esophageal perforation 05/17/2020. EXAM: ESOPHOGRAM/BARIUM SWALLOW TECHNIQUE: Single contrast examination was performed using  water-soluble. FLUOROSCOPY TIME:  Fluoroscopy Time:  1 minutes 36 seconds Radiation Exposure Index (if provided by the fluoroscopic device): Number of Acquired Spot Images: 5 COMPARISON:  CT chest 05/16/2020. FINDINGS: In the recumbent LPO position, patient drank water soluble Omnipaque 300. There is a long esophageal wall stent centered in the mid esophagus. Leakage of contrast from the right posterolateral wall of the distal esophagus, in the region of the distal aspect of the wall stent, is seen with filling of the right pleural space. IMPRESSION: Persistent leak from the right posterolateral aspect of the esophagus, along the distal aspect of the stent, with filling of the adjacent right pleural space. Electronically Signed   By: Lorin Picket M.D.   On: 05/23/2020 10:05    IMPRESSION:   * s/p esophageal stent for post thoracentesis esophageal perforation.  Worsening asp pneumonia (arising prior to admission), R lung opacity/consolidation.   Hx severe GERD, Barrets esophagus, esophageal dysmotility, esoph stricture, 12/2018 esoph dilatation.  Also hx gastric and duodenal  erosions/ulcers which were healed at fup EGD 12/2018.    *   Anemia.  On po iron at home Hgb 6.3 >> 2 PRBCs 8/11 >> 9.2.  Baseline 13 to 14 in 09/2019 - late 03/2020.   Colonoscopy 03/2019 w non-dysplastic TA polyps,  Non-bleeding hemorrhoids  *    New onset rapid A. fib, Cardizem drip in place.  Cardiology consult in process.  *  Moderate malnutriton.  Tolerating Osmolite 1.5 tube feeds at 55 mL/hour and  Prosource protein supplement bid.    *   L ureteral stent w hydronephrosis.  Urology Dr Louis Meckel plans outpt ESWL   PLAN:     *   0730 tmrw EGD, ?esoph dilatation, and stent replacement   Azucena Freed  05/24/2020, 10:22 AM Phone [phone]

## 2020-05-24 NOTE — Progress Notes (Signed)
PT Cancellation Note  Patient Details Name: Evan Chavez MRN: [id number] DOB: 1960-12-30   Cancelled Treatment:    Reason Eval/Treat Not Completed: (P) Medical issues which prohibited therapy Pt continues to be in A-fib with RVR and RN request therapy be held until tomorrow. PT will follow back tomorrow.   Nazaret Chea B. Migdalia Dk PT, DPT Acute Rehabilitation Services Pager [phone] Office [phone]    [place] 05/24/2020, 2:25 PM

## 2020-05-24 NOTE — Progress Notes (Signed)
Patient seen for tachycardia.   He appears to be in a fib RVR which looks to be new for him. Rate in 150s currently with stable BP.  Patient resting, in no apparent distress.   He had echo 2 wks ago with preserved EF, no left atrial enlargement, grossly normal valves.   CHADS-VASc at least 3 for HTN and hx of CVA.    Rate still rapid after 15 mg IV diltiazem. Plan to give another IV bolus dilt and start infusion. Will hold off on anticoagulation given reports of hematemesis yesterday.

## 2020-05-24 NOTE — Progress Notes (Signed)
Pharmacy Antibiotic Note  Evan Chavez is a 59 y.o. male with PNA (s/p VATs) on zosyn (day 10). He is now noted with increasing WBC and recent fever.  Pharmacy has been consulted for vancomycin dosing. -WBC= 25.7, tmax= 101.2 -CrCl ~ 85  Plan: -Vancomycin 1500mg  IV x1 followed by 750mg  IV q12h -Will follow renal function, cultures and clinical progress    Height: 5\' 9"  (175.3 cm) Weight: 65.6 kg (144 lb 10 oz) IBW/kg (Calculated) : 70.7  Temp (24hrs), Avg:98.9 F (37.2 C), Min:98.3 F (36.8 C), Max:101.2 F (38.4 C)  Recent Labs  Lab 05/20/20 0524 05/20/20 0524 05/21/20 0832 05/22/20 0418 05/23/20 0436 05/23/20 1930 05/24/20 0342  WBC 12.3*   < > 13.2* 21.1* 21.6* 24.1* 25.7*  CREATININE 0.90  --  0.73 0.77 0.78  --  0.85   < > = values in this interval not displayed.    Estimated Creatinine Clearance: 86.8 mL/min (by C-G formula based on SCr of 0.85 mg/dL).    No Known Allergies  Antimicrobials this admission: Vancomycin 8/12>> Zosyn 8/3 >> Vanc x1 8/5 Ceftriaxone 7/30>>8/2 Azithromycin 7/30>>8/2 Fluconazole 8/5>>   Microbiology results: 7/29 BCx - negative 7/30 UCx - negative 8/2 pleural fluid - negative 8/4 MRSA PCR - negative 8/5 right chest empyema tissue - rare yeast  Thank you for allowing pharmacy to be a part of this patient's care.  Hildred Laser, PharmD Clinical Pharmacist **Pharmacist phone directory can now be found on [email or web address] ([email or web address]).  Listed under [place].

## 2020-05-24 NOTE — Progress Notes (Signed)
PT Cancellation Note  Patient Details Name: Evan Chavez MRN: [id number] DOB: 08/22/1961   Cancelled Treatment:    Reason Eval/Treat Not Completed: (P) Medical issues which prohibited therapy Pt currently in Afib with RVR. HR 146-164 bpm. PT will follow back this afternoon as able to assess appropriateness of therapy.  Olyn Landstrom B. Migdalia Dk PT, DPT Acute Rehabilitation Services Pager [phone] Office [phone]   [place] 05/24/2020, 9:21 AM

## 2020-05-24 NOTE — Progress Notes (Signed)
Nutrition Follow-up  DOCUMENTATION CODES:   Non-severe (moderate) malnutrition in context of chronic illness  INTERVENTION:   Continue tube feeding via PEG: - Osmolite 1.5 @ goal rate of 55 ml/hr (1320 ml/day) - ProSource TF 45 ml BID - Free water flushes 200 ml every 3 hours  Tube feeding regimen at goal provides 2060 kcal, 105 grams of protein, and 1006 ml of H2O (2606 ml free water total with flushes).  NUTRITION DIAGNOSIS:   Moderate Malnutrition related to chronic illness (Barrett's esophagus, GERD) as evidenced by moderate fat depletion, moderate muscle depletion.  Ongoing  GOAL:   Patient will meet greater than or equal to 90% of their needs  Met with TF  MONITOR:   Diet advancement, Labs, Weight trends, TF tolerance, Skin, I & O's  REASON FOR ASSESSMENT:   Consult Enteral/tube feeding initiation and management  ASSESSMENT:   58-year-old male who presented on 7/28 with fever, SOB. PMH of stroke, HLD, GERD, Barrett's esophagus, PUD, s/p balloon stricturoplasty for GE junction in 2020. Admitted with severe sepsis secondary to bilateral pleural effusions with concomitant bibasilar pneumonia.  8/02 - MBSS with recommendation for regular diet with thin liquids, s/p thoracentesis 8/04 - s/p chest tube insertion, chest CT showing esophageal perforation 8/05 - s/p VATS, esophageal stent placement, PEG tube placement  Patient with new A fib with RVR this morning. Remains NPO. PEG in place, receiving Osmolite 1.5 at 55 ml/h with Prosource TF 45 ml BID, tolerating well. Free water flushes 200 ml every 3 hours. Mag and phos declined after starting tube feedings on 8/6, both have been replaced and are now WNL.  Medications reviewed. Labs reviewed. CBG's: 152-156  UOP: 2250 ml x 24 hours RUQ JP drain: 60 ml x 24 hours  Diet Order:   Diet Order            Diet NPO time specified  Diet effective now                 EDUCATION NEEDS:   Education needs have  been addressed  Skin:   Stage II pressure injury R buttocks Incisions: right chest Other: skin tear L buttocks  Last BM:  8/11 type 7  Height:   Ht Readings from Last 1 Encounters:  05/17/20 5' 9" (1.753 m)    Weight:   Wt Readings from Last 1 Encounters:  05/23/20 65.6 kg    Ideal Body Weight:  72.7 kg  BMI:  Body mass index is 21.36 kg/m.  Estimated Nutritional Needs:   Kcal:  2000-2200  Protein:  100-115 grams  Fluid:  >/= 2.0 L    H, RD, LDN, CNSC Please refer to Amion for contact information.                                                        

## 2020-05-24 NOTE — Progress Notes (Signed)
IP rehab admissions - I met with patient at the bedside.  He is short of breath, coughing and is congested at this time.  He was only able to answer a few questions before tiring.  I did call and speak with his mother.  She is agreeable to CIR admission.  I am waiting to hear back from insurance carrier.  Patient is not medically ready today for inpatient rehab admission.  Will follow up after I hear back from insurance carrier.  Call me for questions.  [phone]

## 2020-05-24 NOTE — Progress Notes (Signed)
Pt's hr jumped to 190's. 5 mg metoprolol given and EKG obtained. Pt found to be in [place]. On call MD notified. Received orders for one time push of diltiazem and IV mag.

## 2020-05-25 ENCOUNTER — Inpatient Hospital Stay (HOSPITAL_COMMUNITY): Payer: Medicaid Other

## 2020-05-25 ENCOUNTER — Encounter (HOSPITAL_COMMUNITY)
Admission: EM | Disposition: A | Discharge status: Skilled Nursing Facility | Payer: Self-pay | Source: Home / Self Care | Attending: Thoracic Surgery (Cardiothoracic Vascular Surgery)

## 2020-05-25 ENCOUNTER — Inpatient Hospital Stay (HOSPITAL_COMMUNITY): Payer: Medicaid Other | Admitting: Certified Registered Nurse Anesthetist

## 2020-05-25 DIAGNOSIS — K9189 Other postprocedural complications and disorders of digestive system: Secondary | ICD-10-CM | POA: Diagnosis present

## 2020-05-25 HISTORY — PX: ESOPHAGOGASTRODUODENOSCOPY: SHX5428

## 2020-05-25 HISTORY — PX: VIDEO ASSISTED THORACOSCOPY (VATS)/EMPYEMA: SHX6172

## 2020-05-25 HISTORY — PX: ESOPHAGEAL STENT PLACEMENT: SHX5540

## 2020-05-25 LAB — GLUCOSE, CAPILLARY
Glucose-Capillary: 120 mg/dL — ABNORMAL HIGH (ref 70–99)
Glucose-Capillary: 124 mg/dL — ABNORMAL HIGH (ref 70–99)
Glucose-Capillary: 125 mg/dL — ABNORMAL HIGH (ref 70–99)
Glucose-Capillary: 126 mg/dL — ABNORMAL HIGH (ref 70–99)
Glucose-Capillary: 133 mg/dL — ABNORMAL HIGH (ref 70–99)
Glucose-Capillary: 149 mg/dL — ABNORMAL HIGH (ref 70–99)

## 2020-05-25 SURGERY — EGD (ESOPHAGOGASTRODUODENOSCOPY)
Anesthesia: General | Site: Esophagus | Laterality: Right

## 2020-05-25 MED ORDER — FENTANYL CITRATE (PF) 250 MCG/5ML IJ SOLN
INTRAMUSCULAR | Status: DC | PRN
  Administered 2020-05-25: 100 ug via INTRAVENOUS
  Administered 2020-05-25 (×4): 50 ug via INTRAVENOUS

## 2020-05-25 MED ORDER — LIDOCAINE 2% (20 MG/ML) 5 ML SYRINGE
INTRAMUSCULAR | Status: DC | PRN
  Administered 2020-05-25: 100 mg via INTRAVENOUS

## 2020-05-25 MED ORDER — DEXAMETHASONE SODIUM PHOSPHATE 10 MG/ML IJ SOLN
INTRAMUSCULAR | Status: AC
  Filled 2020-05-25: qty 1

## 2020-05-25 MED ORDER — FENTANYL CITRATE (PF) 100 MCG/2ML IJ SOLN
25.0000 ug | INTRAMUSCULAR | Status: DC | PRN
  Administered 2020-05-26 – 2020-06-01 (×4): 25 ug via INTRAVENOUS
  Filled 2020-05-25 (×4): qty 2

## 2020-05-25 MED ORDER — MIDAZOLAM HCL 2 MG/2ML IJ SOLN
INTRAMUSCULAR | Status: AC
  Filled 2020-05-25: qty 2

## 2020-05-25 MED ORDER — SUCCINYLCHOLINE CHLORIDE 200 MG/10ML IV SOSY
PREFILLED_SYRINGE | INTRAVENOUS | Status: AC
  Filled 2020-05-25: qty 10

## 2020-05-25 MED ORDER — LIDOCAINE 2% (20 MG/ML) 5 ML SYRINGE
INTRAMUSCULAR | Status: AC
  Filled 2020-05-25: qty 5

## 2020-05-25 MED ORDER — SODIUM CHLORIDE 0.9 % IV SOLN: INTRAVENOUS | Status: DC

## 2020-05-25 MED ORDER — ONDANSETRON HCL 4 MG/2ML IJ SOLN
INTRAMUSCULAR | Status: DC | PRN
  Administered 2020-05-25: 4 mg via INTRAVENOUS

## 2020-05-25 MED ORDER — ONDANSETRON HCL 4 MG/2ML IJ SOLN
4.0000 mg | Freq: Four times a day (QID) | INTRAMUSCULAR | Status: DC | PRN
  Administered 2020-06-22 – 2020-07-11 (×2): 4 mg via INTRAVENOUS
  Filled 2020-05-25 (×2): qty 2

## 2020-05-25 MED ORDER — 0.9 % SODIUM CHLORIDE (POUR BTL) OPTIME
TOPICAL | Status: DC | PRN
  Administered 2020-05-25: 2000 mL

## 2020-05-25 MED ORDER — DEXAMETHASONE SODIUM PHOSPHATE 10 MG/ML IJ SOLN
INTRAMUSCULAR | Status: DC | PRN
  Administered 2020-05-25: 5 mg via INTRAVENOUS

## 2020-05-25 MED ORDER — LACTATED RINGERS IV SOLN: INTRAVENOUS | Status: DC | PRN

## 2020-05-25 MED ORDER — LACTATED RINGERS IV SOLN
INTRAVENOUS | Status: DC | PRN
Start: 2020-05-25 — End: 2020-05-25

## 2020-05-25 MED ORDER — SODIUM CHLORIDE 0.9 % IV SOLN
INTRAVENOUS | Status: DC | PRN
  Administered 2020-05-25: 100 mL

## 2020-05-25 MED ORDER — PROPOFOL 10 MG/ML IV BOLUS
INTRAVENOUS | Status: DC | PRN
  Administered 2020-05-25: 120 mg via INTRAVENOUS

## 2020-05-25 MED ORDER — ONDANSETRON HCL 4 MG/2ML IJ SOLN
INTRAMUSCULAR | Status: AC
  Filled 2020-05-25: qty 2

## 2020-05-25 MED ORDER — FENTANYL CITRATE (PF) 250 MCG/5ML IJ SOLN
INTRAMUSCULAR | Status: AC
  Filled 2020-05-25: qty 5

## 2020-05-25 MED ORDER — PROPOFOL 10 MG/ML IV BOLUS
INTRAVENOUS | Status: AC
  Filled 2020-05-25: qty 20

## 2020-05-25 MED ORDER — INSULIN ASPART 100 UNIT/ML ~~LOC~~ SOLN
0.0000 [IU] | Freq: Three times a day (TID) | SUBCUTANEOUS | Status: DC
  Administered 2020-05-25 – 2020-05-27 (×4): 2 [IU] via SUBCUTANEOUS
  Administered 2020-05-27: 4 [IU] via SUBCUTANEOUS
  Administered 2020-05-27 – 2020-05-28 (×4): 2 [IU] via SUBCUTANEOUS
  Administered 2020-05-28: 4 [IU] via SUBCUTANEOUS
  Administered 2020-05-28: 2 [IU] via SUBCUTANEOUS
  Administered 2020-05-29: 4 [IU] via SUBCUTANEOUS
  Administered 2020-05-29 – 2020-06-02 (×15): 2 [IU] via SUBCUTANEOUS
  Administered 2020-06-02: 4 [IU] via SUBCUTANEOUS
  Administered 2020-06-02 – 2020-06-05 (×12): 2 [IU] via SUBCUTANEOUS
  Administered 2020-06-05: 4 [IU] via SUBCUTANEOUS
  Administered 2020-06-06 – 2020-06-10 (×11): 2 [IU] via SUBCUTANEOUS
  Administered 2020-06-10: 4 [IU] via SUBCUTANEOUS
  Administered 2020-06-10 – 2020-06-13 (×11): 2 [IU] via SUBCUTANEOUS
  Administered 2020-06-14: 4 [IU] via SUBCUTANEOUS
  Administered 2020-06-14 – 2020-06-18 (×14): 2 [IU] via SUBCUTANEOUS
  Administered 2020-06-18: 4 [IU] via SUBCUTANEOUS
  Administered 2020-06-19 – 2020-06-22 (×13): 2 [IU] via SUBCUTANEOUS
  Administered 2020-06-23: 4 [IU] via SUBCUTANEOUS
  Administered 2020-06-23 (×3): 2 [IU] via SUBCUTANEOUS
  Administered 2020-06-24: 4 [IU] via SUBCUTANEOUS
  Administered 2020-06-24 – 2020-06-26 (×7): 2 [IU] via SUBCUTANEOUS
  Administered 2020-06-26: 4 [IU] via SUBCUTANEOUS
  Administered 2020-06-26 – 2020-07-11 (×26): 2 [IU] via SUBCUTANEOUS

## 2020-05-25 MED ORDER — MIDAZOLAM HCL 5 MG/5ML IJ SOLN
INTRAMUSCULAR | Status: DC | PRN
  Administered 2020-05-25: 2 mg via INTRAVENOUS

## 2020-05-25 MED ORDER — ROCURONIUM BROMIDE 10 MG/ML (PF) SYRINGE
PREFILLED_SYRINGE | INTRAVENOUS | Status: AC
  Filled 2020-05-25: qty 10

## 2020-05-25 MED ORDER — SUGAMMADEX SODIUM 200 MG/2ML IV SOLN
INTRAVENOUS | Status: DC | PRN
  Administered 2020-05-25 (×2): 100 mg via INTRAVENOUS

## 2020-05-25 MED ORDER — SODIUM CHLORIDE 0.9 % IV SOLN: INTRAVENOUS | Status: DC | PRN

## 2020-05-25 MED ORDER — SUCCINYLCHOLINE CHLORIDE 200 MG/10ML IV SOSY
PREFILLED_SYRINGE | INTRAVENOUS | Status: DC | PRN
  Administered 2020-05-25: 180 mg via INTRAVENOUS

## 2020-05-25 MED ORDER — ROCURONIUM BROMIDE 10 MG/ML (PF) SYRINGE
PREFILLED_SYRINGE | INTRAVENOUS | Status: DC | PRN
  Administered 2020-05-25 (×2): 50 mg via INTRAVENOUS

## 2020-05-25 SURGICAL SUPPLY — 52 items
BLADE CLIPPER SURG (BLADE) IMPLANT
BUTTON OLYMPUS DEFENDO 5 PIECE (MISCELLANEOUS) ×5 IMPLANT
CANISTER SUCT 3000ML PPV (MISCELLANEOUS) ×10 IMPLANT
CATH THORACIC 28FR (CATHETERS) ×5 IMPLANT
CNTNR URN SCR LID CUP LEK RST (MISCELLANEOUS) ×3 IMPLANT
CONT SPEC 4OZ STRL OR WHT (MISCELLANEOUS) ×2
COVER BACK TABLE 60X90IN (DRAPES) ×5 IMPLANT
DEFOGGER SCOPE WARMER CLEARIFY (MISCELLANEOUS) ×5 IMPLANT
DERMABOND ADVANCED (GAUZE/BANDAGES/DRESSINGS) ×2
DERMABOND ADVANCED .7 DNX12 (GAUZE/BANDAGES/DRESSINGS) ×3 IMPLANT
DRAPE C-ARM 42X72 X-RAY (DRAPES) IMPLANT
DRAPE SLUSH/WARMER DISC (DRAPES) ×5 IMPLANT
EVACUATOR SILICONE 100CC (DRAIN) ×5 IMPLANT
FORCEPS GRASP COMBO 8X230 (FORCEP) ×5 IMPLANT
GAUZE SPONGE 4X4 12PLY STRL (GAUZE/BANDAGES/DRESSINGS) ×10 IMPLANT
GLOVE BIO SURGEON STRL SZ7 (GLOVE) IMPLANT
GLOVE BIO SURGEON STRL SZ7.5 (GLOVE) ×5 IMPLANT
GLOVE BIOGEL PI IND STRL 6.5 (GLOVE) ×3 IMPLANT
GLOVE BIOGEL PI INDICATOR 6.5 (GLOVE) ×2
GOWN STRL REUS W/ TWL LRG LVL3 (GOWN DISPOSABLE) ×3 IMPLANT
GOWN STRL REUS W/ TWL XL LVL3 (GOWN DISPOSABLE) ×3 IMPLANT
GOWN STRL REUS W/TWL LRG LVL3 (GOWN DISPOSABLE) ×2
GOWN STRL REUS W/TWL XL LVL3 (GOWN DISPOSABLE) ×2
GUIDEWIRE JAGWIRE HP (WIRE) ×5 IMPLANT
KIT TURNOVER KIT B (KITS) ×5 IMPLANT
MARKER SKIN DUAL TIP RULER LAB (MISCELLANEOUS) IMPLANT
NS IRRIG 1000ML POUR BTL (IV SOLUTION) ×10 IMPLANT
OIL SILICONE PENTAX (PARTS (SERVICE/REPAIRS)) IMPLANT
PACK UNIVERSAL I (CUSTOM PROCEDURE TRAY) ×5 IMPLANT
PAD ARMBOARD 7.5X6 YLW CONV (MISCELLANEOUS) ×15 IMPLANT
STENT WALLFLEX 23X15.5 (STENTS) IMPLANT
STENT WALLFLEX ESOPH 23X125MM (Stent) ×5 IMPLANT
SUT SILK  1 MH (SUTURE) ×6
SUT SILK 1 MH (SUTURE) ×9 IMPLANT
SUT VIC AB 2-0 CT1 27 (SUTURE) ×2
SUT VIC AB 2-0 CT1 TAPERPNT 27 (SUTURE) ×3 IMPLANT
SUT VIC AB 3-0 SH 27 (SUTURE) ×2
SUT VIC AB 3-0 SH 27X BRD (SUTURE) ×3 IMPLANT
SUT VICRYL 0 UR6 27IN ABS (SUTURE) ×5 IMPLANT
SYR 20ML ECCENTRIC (SYRINGE) IMPLANT
SYR 30ML SLIP (SYRINGE) ×5 IMPLANT
SYR BULB IRRIG 60ML STRL (SYRINGE) ×5 IMPLANT
TOWEL GREEN STERILE (TOWEL DISPOSABLE) ×5 IMPLANT
TOWEL GREEN STERILE FF (TOWEL DISPOSABLE) ×5 IMPLANT
TRAP SPECIMEN MUCUS 40CC (MISCELLANEOUS) ×5 IMPLANT
TROCAR XCEL NON-BLD 5MMX100MML (ENDOMECHANICALS) ×5 IMPLANT
TUBE CONNECTING 20'X1/4 (TUBING) ×1
TUBE CONNECTING 20X1/4 (TUBING) ×4 IMPLANT
TUBING ENDO SMARTCAP (MISCELLANEOUS) ×5 IMPLANT
UNDERPAD 30X36 HEAVY ABSORB (UNDERPADS AND DIAPERS) ×5 IMPLANT
WATER STERILE IRR 1000ML POUR (IV SOLUTION) ×5 IMPLANT
WIRE HYDRA 450CM (MISCELLANEOUS) IMPLANT

## 2020-05-25 NOTE — Anesthesia Procedure Notes (Addendum)
Procedure Name: Intubation Date/Time: 05/25/2020 8:25 AM Performed by: Trinna Post., CRNA Pre-anesthesia Checklist: Patient identified, Emergency Drugs available, Suction available and Patient being monitored Patient Re-evaluated:Patient Re-evaluated prior to induction Oxygen Delivery Method: Circle system utilized Preoxygenation: Pre-oxygenation with 100% oxygen Induction Type: IV induction Ventilation: Mask ventilation without difficulty Laryngoscope Size: Mac and 4 Grade View: Grade I Tube type: Oral Endobronchial tube: Left, Double lumen EBT, EBT position confirmed by fiberoptic bronchoscope and EBT position confirmed by auscultation and 39 Fr Number of attempts: 2 Airway Equipment and Method: Stylet Placement Confirmation: ETT inserted through vocal cords under direct vision,  positive ETCO2 and breath sounds checked- equal and bilateral Tube secured with: Tape Dental Injury: Teeth and Oropharynx as per pre-operative assessment  Comments: RSI performed due to full stomach and history of difficult intubation with DLT in previous surgery. DVL x1 with MAC 4 and grade 1 view of cords but hard to pass DLT. Patient easy to mask with improved sats before second DVL attempt. DVL again with MAC 4, DLT easily passed after putting much greater curve on DLT. All VSS throughout.

## 2020-05-25 NOTE — Transfer of Care (Signed)
Immediate Anesthesia Transfer of Care Note  Patient: Evan Chavez  Procedure(s) Performed: ESOPHAGOGASTRODUODENOSCOPY (EGD); ESOPHAGEAL STENT REMOVAL. (N/A Esophagus) ESOPHAGEAL STENT PLACEMENT USING A 23 MM COVERED STENT (N/A Esophagus) VIDEO ASSISTED THORACOSCOPY (VATS)/EMPYEMA (Right Chest)  Patient Location: PACU  Anesthesia Type:General  Level of Consciousness: awake and drowsy  Airway & Oxygen Therapy: Patient Spontanous Breathing and Patient connected to face mask oxygen - Patient tachypneic but otherwise stable, Dr Ermalene Postin notified  Post-op Assessment: Report given to RN and Post -op Vital signs reviewed and stable  Post vital signs: Reviewed and stable  Last Vitals:  Vitals Value Taken Time  BP 134/75 05/25/20 1045  Temp    Pulse 115 05/25/20 1059  Resp 25 05/25/20 1059  SpO2 94 % 05/25/20 1059  Vitals shown include unvalidated device data.  Last Pain:  Vitals:   05/25/20 0459  TempSrc: Oral  PainSc:       Patients Stated Pain Goal: 0 (31/49/70 2637)  Complications: No complications documented.

## 2020-05-25 NOTE — Op Note (Signed)
      [address]       [place] [address]             [phone]        05/25/2020  Patient:  Evan Chavez Pre-Op Dx: Persistent esophageal leak   Right loculated pleural effusion Post-op Dx: Same Procedure: -Right thoracoscopy -Drainage of loculated pleural effusion - Esophagogastroscopy -23 mm covered esophageal stent placement - Esophagram with Omnipaque   Surgeon and Role:      * Mackinze Criado, Lucile Crater, MD - Primary  Anesthesia  general EBL: Minimal Blood Administration: None Specimen: Empyema   Counts: correct   Indications: Evan Chavez is a 59 year old male that was previously treated for an esophageal perforation this hospitalization.  He had a persistent leak distal to the covered esophageal stent, and persistent tachycardia.  He was taken back to the operating theater for a chest washout and repositioning of esophageal stent.  Findings: Abscess cavity in the posterior mediastinum by the esophagus was noted.  On esophagoscopy the stent was removed and the perforation was evident.  We were again able to pass through the stricture without much difficulty.  For this reason I elected not to do an esophageal dilations.  The new covered stent was deployed through the GE junction with good coverage of the esophageal perforation.  On esophagram there is no evidence of leak antegrade or retrograde.  Operative Technique: After the risks, benefits and alternatives were thoroughly discussed, the patient was brought to the operative theatre.  Anesthesia was induced, the patient was then placed in a left lateral decubitus position and was prepped and draped in normal sterile fashion.  An appropriate surgical pause was performed, and pre-operative antibiotics were dosed accordingly.  We began with 2cm incision in the anterior axillary line at the 5th intercostal space.  The chest was entered, and we then placed a 1cm incision at the 10th intercostal space, and  introduced our camera port.  The lung was directly visualized.   The lung was then mobilized off of the chest wall.  As we began to move towards the posterior mediastinum pus pocket was encountered.  This was completely evacuated.  The chest was then irrigated.   A 28 F argyle and 19 French Blake chest tube were then placed, and we watch the lung re-expand.  The skin and soft tissue were closed with absorbable suture    Another surgical pause was performed, and pre-operative antibiotics were dosed accordingly.  The gastroscope was advanced through the oropharynx into the cervical esophagus under direct visualization.  The scope was passed into the stomach.  The scope was then pulled back, and the previous stent was removed.  The esophageal mucosa was visualized.  A mucosal defect was noted.   This area along with the GE junction were marked on the skin with paper clips.  Next a Jag wire was passed through the gastroscope into the stomach with fluoroscopic guidance.  The esophageal stent was then placed over the wire, and positioned under fluoroscopy to cover the mucosal defect.  Another esophagram was performed which showed no evidence of leak.  The gastroscope was then removed.  The patient tolerated the procedure without any immediate complications, and was transferred to the PACU in stable condition.  Massa Pe Bary Leriche

## 2020-05-25 NOTE — Progress Notes (Signed)
PT Cancellation Note  Patient Details Name: Evan Chavez MRN: [id number] DOB: December 14, 1960   Cancelled Treatment:    Reason Eval/Treat Not Completed: Patient at procedure or test/unavailable. Pt currently in OR. Will check back as able to continue with PT POC.    Thelma Comp 05/25/2020, 7:43 AM  Rolinda Roan, PT, DPT Acute Rehabilitation Services Pager: [phone] Office: [phone]

## 2020-05-25 NOTE — Progress Notes (Signed)
Inpatient Rehabilitation Admissions Coordinator  Noted surgical intervention today. We will follow up next week.  Danne Baxter, RN, MSN Rehab Admissions Coordinator [phone] 05/25/2020 3:09 PM

## 2020-05-25 NOTE — Progress Notes (Signed)
PROGRESS NOTE  Evan Chavez  DOB: 09-Sep-1961  PCP: Rosita Fire, MD ACZ:[id number]  DOA: 05/10/2020  LOS: 14 days   Chief Complaint  Patient presents with  . Shortness of Breath   Brief narrative: Evan Chavez is a 59 y.o. male with PMH of GERD, Barrett's esophagus, peptic ulcer disease with extensive gastric erosion, duodenal ulcer and esophageal stricture, also with history of prior stroke, hyperlipidemia, BPH, remote to cocaine abuse. He was hospitalized November 2019 with bibasal pneumonia and imaging showed esophageal stricture and impaired esophageal motility. EGD confirmed severe reflux esophagitis with peptic ulcer disease with extensive gastric erosions and duodenal ulcer.  Biopsy revealed Barrett's esophagus. He had a repeat EGD 12/2018 with balloon dilatation for peptic stricture, his dysphagia improved and surveillance EGD was advised.   He presented to the ED at [facility] on 7/28 with generalized weakness, productive cough and low-grade fever.   Chest x-ray showed bibasilar pneumonia, treated with amoxicillin and doxycycline.   He returned to the ED 7/29 due to increased shortness of breath.  Labs showed leukocytosis mild lactic acidosis.  CT angiogram of the chest showed bibasal consolidation and pleural effusions .  He was started on  ceftriaxone and azithromycin and admitted to hospital service.  8/2, patient had a successful right thoracentesis with 30 mL of cloudy yellow right pleural fluid drainage. Chest x-rays obtained between 8/2-8/4 showed worsening right-sided hydropneumothorax with possible lung collapse. 8/4, CT chest showed esophageal perforation arising from the right lateral aspect of the mid to distal esophagus and large right-sided hydropneumothorax.  It also showed findings consistent with aspiration pneumonia. 8/4 patient underwent right-sided chest tube placement.  He was [facility] from [facility] for [facility] surgery  evaluation. 8/5, patient underwent right video-assisted thoracoscopy, decortication, EGD, PEG tube placement, esophageal stent placement with a 25 X 125 mm stent by CT surgery. Patient was subsequently transferred hospitalist service.  8/10, patient had an episode of hematemesis  8/11, repeat esophagogram showed persistently leakage from esophagus to right pleural space. 8/13, patient underwent right thoracoscopy, drainage of loculated pleural effusion, esophagus gastroscopy, esophageal stent placement and esophagogram.  Subjective: Patient was seen and examined this afternoon postprocedure.   Not in distress. Has a chest tube on right side.  Patient continues to remain tachycardic but controlled mostly between 100 and 110.  Blood pressure stable.  On 6 L oxygen by nasal cannula.  Assessment/Plan: Esophageal rupture leading to right-sided hydropneumothorax Right-sided complex loculated parapneumonic effusion, freely flowing effusion on left -Diagnosed on 8/4.  Seen by thoracic surgery and GI. -See timeline of events as above. -Cardiothoracic surgery following.  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   -Start on normal saline at 75 mill per hour.  Continue to trend WBC and temperature trend. -Continue to monitor.  A. fib with RVR -It is unclear if patient had A. fib in the past but he was on oral Cardizem. -Recommendations, patient went into A. fib with RVR.  Cardizem drip was started.  Once patient is able to tolerate oral intake, will switch to oral Cardizem again. -Cardiology consult appreciated.  Acute blood loss anemia  -He has longstanding history of reflux esophagitis, esophageal stricture and underwent recently stenting. -Hemoglobin level more than 11 at baseline.  It has been low in this hospitalization secondary to esophageal rupture and surgery.  -8/11 hemoglobin was at the lowest at  6.3.  10 units of PRBC transfused.   -  Repeat hemoglobin tomorrow.   Recent Labs    05/16/20 0519 05/17/20 2036 05/18/20 0341 05/19/20 0129 05/20/20 0524 05/21/20 0832 05/22/20 0418 05/23/20 0436 05/23/20 1930 05/24/20 0342  HGB 8.5* 9.9* 7.8* 7.1* 7.0* 7.8* 7.8* 6.3* 9.0* 9.2*   Acute respiratory with hypoxia -Secondary to pneumonia, hydropneumothorax, effusion, anemia, persistent esophageal rupture. -Currently on 6 L oxygen by nasal cannula.  Continue to wean down as tolerated.  Acute delirium -Patient has had episodes of confusion likely secondary to sepsis, prolonged hospitalization and previous stroke. -Currently slow to respond, oriented to place but not restless or agitated.  Continue reorientation efforts. -As needed IV Haldol.  Left hydronephrosis and needs ESWL -Noted in CT scan obtained while in the hospital.  Seen by urology. Lithotripsy was scheduled on 8/10 but patient is to remain in the hospital. -Patient will follow up with Urology on discharge.  Prior history of stroke -Prior to admission, patient was on aspirin 325 mg daily and Lipitor 40 mg daily. -Aspirin is currently on hold.  Will resume Lipitor post discharge.  Mobility: PT eval obtained.  CIR recommended. Code Status:   Code Status: Full Code  Nutritional status: Body mass index is 21.1 kg/m. Nutrition Problem: Moderate Malnutrition Etiology: chronic illness (Barrett's esophagus, GERD) Signs/Symptoms: moderate fat depletion, moderate muscle depletion Diet Order            Diet NPO time specified  Diet effective now                 DVT prophylaxis: SCD's Start: 05/25/20 1355 Place and maintain sequential compression device Start: 05/23/20 1040 SCDs Start: 05/11/20 0134   Antimicrobials:  IV Zosyn, fluconazole IV, vancomycin IV Fluid: Not on IV fluid  Consultants: CT surgery, cardiology Family Communication:  None at bedside  Incision (Closed) 05/17/20 Chest Right (Active)  Date  First Assessed/Time First Assessed: 05/17/20 1821   Location: Chest  Location Orientation: Right    Assessments 05/17/2020  6:55 PM 05/22/2020  8:00 PM  Dressing Type Pressure dressing Gauze (Comment)  Dressing Clean;Dry;Intact Clean;Dry;Intact  Site / Wound Assessment Dressing in place / Unable to assess Dressing in place / Unable to assess  Drainage Amount None --     No Linked orders to display     Pressure Injury 05/17/20 Buttocks Right Stage 2 -  Partial thickness loss of dermis presenting as a shallow open injury with a red, pink wound bed without slough. (Active)  Date First Assessed/Time First Assessed: 05/17/20 0800   Location: Buttocks  Location Orientation: Right  Staging: Stage 2 -  Partial thickness loss of dermis presenting as a shallow open injury with a red, pink wound bed without slough.    Assessments 05/18/2020  8:00 AM 05/22/2020  8:13 AM  Dressing Type Foam - Lift dressing to assess site every shift --  Dressing Clean;Dry;Intact --  Drainage Amount -- None     No Linked orders to display     Wound / Incision (Open or Dehisced) 05/19/20 Skin tear Buttocks Left (Active)  Date First Assessed/Time First Assessed: 05/19/20 1707   Wound Type: Skin tear  Location: Buttocks  Location Orientation: Left    Assessments 05/20/2020  8:30 AM 05/22/2020  8:00 PM  Dressing Type Foam - Lift dressing to assess site every shift None  Dressing Status Clean;Dry;Intact --  Site / Wound Assessment -- Clean;Dry  Peri-wound Assessment Intact --     No Linked orders to display     Incision (Closed) 05/25/20 Chest Right (Active)  Date First Assessed/Time First Assessed: 05/25/20 0950   Location: Chest  Location Orientation: Right    Assessments 05/25/2020 10:45 AM 05/25/2020  1:00 PM  Dressing Type Gauze (Comment);Tape dressing Gauze (Comment)  Dressing Clean;Intact;Dry Clean;Dry;Intact  Site / Wound Assessment Dressing in place / Unable to assess Dressing in place / Unable to assess  Drainage  Amount None None     No Linked orders to display    Status is: Inpatient  Remains inpatient appropriate because:Ongoing diagnostic testing needed not appropriate for outpatient work up and IV treatments appropriate due to intensity of illness or inability to take PO   Dispo: The patient is from: Home              Anticipated d/c is to: SNF  Vs CIR              Anticipated d/c date is: More than 3 days              Patient currently is not medically stable to d/c.   Infusions:  . sodium chloride 70 mL/hr at 05/16/20 2122  . sodium chloride    . sodium chloride    . diltiazem (CARDIZEM) infusion 12.5 mg/hr (05/25/20 1100)  . fluconazole (DIFLUCAN) IV 400 mg (05/24/20 0807)  . piperacillin-tazobactam (ZOSYN)  IV 3.375 g (05/25/20 1720)  . vancomycin 750 mg (05/24/20 2217)    Scheduled Meds: . bisacodyl  10 mg Oral Daily  . insulin aspart  0-24 Units Subcutaneous TID AC & HS  . mouth rinse  15 mL Mouth Rinse q12n4p  . pantoprazole (PROTONIX) IV  40 mg Intravenous Q12H  . sennosides  5 mL Per Tube QHS    Antimicrobials: Anti-infectives (From admission, onward)   Start     Dose/Rate Route Frequency Ordered Stop   05/24/20 2100  vancomycin (VANCOREADY) IVPB 750 mg/150 mL     Discontinue     750 mg 150 mL/hr over 60 Minutes Intravenous Every 12 hours 05/24/20 0813     05/24/20 0900  vancomycin (VANCOREADY) IVPB 1500 mg/300 mL        1,500 mg 150 mL/hr over 120 Minutes Intravenous  Once 05/24/20 0813 05/24/20 1221   05/17/20 0900  fluconazole (DIFLUCAN) IVPB 400 mg     Discontinue     400 mg 100 mL/hr over 120 Minutes Intravenous Every 24 hours 05/17/20 0749     05/17/20 0600  vancomycin (VANCOREADY) IVPB 750 mg/150 mL  Status:  Discontinued        750 mg 150 mL/hr over 60 Minutes Intravenous Every 8 hours 05/16/20 2154 05/17/20 0903   05/17/20 0400  vancomycin (VANCOREADY) IVPB 750 mg/150 mL  Status:  Discontinued        750 mg 150 mL/hr over 60 Minutes Intravenous Every 8  hours 05/16/20 1945 05/16/20 2154   05/16/20 2200  vancomycin (VANCOREADY) IVPB 1250 mg/250 mL        1,250 mg 166.7 mL/hr over 90 Minutes Intravenous STAT 05/16/20 2153 05/16/20 2348   05/16/20 2000  vancomycin (VANCOREADY) IVPB 1250 mg/250 mL  Status:  Discontinued        1,250 mg 166.7 mL/hr over 90 Minutes Intravenous  Once 05/16/20 1945 05/16/20 2153   05/15/20 1600  piperacillin-tazobactam (ZOSYN) IVPB 3.375 g     Discontinue     3.375 g 12.5 mL/hr over 240 Minutes Intravenous Every 8 hours 05/15/20 0945     05/15/20 0930  piperacillin-tazobactam (ZOSYN) IVPB 3.375 g  3.375 g 100 mL/hr over 30 Minutes Intravenous  Once 05/15/20 0921 05/15/20 1009   05/13/20 2000  azithromycin (ZITHROMAX) tablet 500 mg        500 mg Oral Daily 05/13/20 1155 05/14/20 2127   05/10/20 2200  cefTRIAXone (ROCEPHIN) 2 g in sodium chloride 0.9 % 100 mL IVPB  Status:  Discontinued        2 g 200 mL/hr over 30 Minutes Intravenous Every 24 hours 05/10/20 2148 05/15/20 0911   05/10/20 2200  azithromycin (ZITHROMAX) 500 mg in sodium chloride 0.9 % 250 mL IVPB  Status:  Discontinued        500 mg 250 mL/hr over 60 Minutes Intravenous Every 24 hours 05/10/20 2148 05/13/20 1155      PRN meds: sodium chloride, Place/Maintain arterial line **AND** sodium chloride, Place/Maintain arterial line **AND** sodium chloride, [DISCONTINUED] acetaminophen **OR** acetaminophen, fentaNYL (SUBLIMAZE) injection, hydrALAZINE, ipratropium-albuterol, metoprolol tartrate, ondansetron (ZOFRAN) IV   Objective: Vitals:   05/25/20 1300 05/25/20 1445  BP: 136/80 133/76  Pulse: (!) 111 (!) 107  Resp: (!) 33 (!) 31  Temp: 98.6 F (37 C)   SpO2: 96% 97%    Intake/Output Summary (Last 24 hours) at 05/25/2020 1741 Last data filed at 05/25/2020 1400 Gross per 24 hour  Intake 1598.5 ml  Output 2040 ml  Net -441.5 ml   Filed Weights   05/22/20 0320 05/23/20 0427 05/25/20 0459  Weight: 64 kg 65.6 kg 64.8 kg   Weight change:   Body mass index is 21.1 kg/m.   Physical Exam: General exam: Appears calm and comfortable. Not in distress. Skin: No rashes, lesions or ulcers. HEENT: Atraumatic, normocephalic, supple neck, no obvious bleeding Lungs: Clear to auscultation bilaterally. Right thorax JP drain in place CVS: irregular tachycardia, no murmur  GI/Abd soft, nontender, nondistended, bowel sound present.  PEG tube site intact CNS: Alert, awake, oriented to place only.  Slow to respond. Not restless or agitated. Psychiatry: Depressed look Extremities: No pedal edema, no calf tenderness  Data Review: I have personally reviewed the laboratory data and studies available.  Recent Labs  Lab 05/20/20 0524 05/20/20 0524 05/21/20 0832 05/22/20 0418 05/23/20 0436 05/23/20 1930 05/24/20 0342  WBC 12.3*   < > 13.2* 21.1* 21.6* 24.1* 25.7*  NEUTROABS 10.4*  --  11.3* 18.8* 19.0*  --  22.7*  HGB 7.0*   < > 7.8* 7.8* 6.3* 9.0* 9.2*  HCT 23.5*   < > 25.8* 25.4* 20.9* 27.6* 28.3*  MCV 87.4   < > 86.0 87.3 88.2 85.4 85.8  PLT 489*   < > 521* 571* 582* 506* 488*   < > = values in this interval not displayed.   Recent Labs  Lab 05/19/20 2026 05/19/20 2026 05/20/20 0524 05/21/20 0832 05/22/20 0418 05/23/20 0436 05/23/20 0844 05/24/20 0342  NA 147*   < > 144 139 139 138  --  135  K 3.6   < > 4.5 3.7 3.9 4.0  --  4.1  CL 114*   < > 110 106 104 104  --  105  CO2 25   < > 22 23 25 24   --  20*  GLUCOSE 133*   < > 139* 147* 157* 153*  --  170*  BUN 11   < > 10 11 13 16   --  11  CREATININE 0.85   < > 0.90 0.73 0.77 0.78  --  0.85  CALCIUM 8.0*   < > 8.2* 8.4* 8.8* 8.6*  --  8.1*  MG 2.0  --  1.7 1.7  --   --  1.8 2.1  PHOS <1.0*  --  3.3 3.5  --   --   --   --    < > = values in this interval not displayed.   Lab Results  Component Value Date   HGBA1C 5.5 07/12/2018       Component Value Date/Time   CHOL 124 07/13/2018 0524   TRIG 82 07/13/2018 0524   HDL 31 (L) 07/13/2018 0524   CHOLHDL 4.0 07/13/2018  0524   VLDL 16 07/13/2018 0524   LDLCALC 77 07/13/2018 0524    Signed, Terrilee Croak, MD [facility] Pager: [phone] (Secure Chat preferred). 05/25/2020

## 2020-05-25 NOTE — Anesthesia Procedure Notes (Signed)
Arterial Line Insertion Start/End8/13/2021 7:30 AM Performed by: Trinna Post., CRNA, CRNA  Patient location: Pre-op. Preanesthetic checklist: patient identified, IV checked, site marked, risks and benefits discussed, surgical consent, monitors and equipment checked, pre-op evaluation, timeout performed and anesthesia consent Lidocaine 1% used for infiltration Right, radial was placed Catheter size: 20 G Hand hygiene performed  and maximum sterile barriers used   Attempts: 1 Procedure performed without using ultrasound guided technique. Following insertion, dressing applied and Biopatch. Post procedure assessment: normal  Patient tolerated the procedure well with no immediate complications.

## 2020-05-25 NOTE — Anesthesia Preprocedure Evaluation (Signed)
Anesthesia Evaluation  Patient identified by MRN, date of birth, ID band Patient awake    Reviewed: Allergy & Precautions, NPO status , Patient's Chart, lab work & pertinent test results  Airway Mallampati: II  TM Distance: >3 FB Neck ROM: Full    Dental   Pulmonary former smoker,    Pulmonary exam normal        Cardiovascular hypertension, Pt. on medications Normal cardiovascular exam     Neuro/Psych CVA    GI/Hepatic GERD  Medicated and Controlled,  Endo/Other    Renal/GU      Musculoskeletal   Abdominal   Peds  Hematology   Anesthesia Other Findings   Reproductive/Obstetrics                             Anesthesia Physical Anesthesia Plan  ASA: III and emergent  Anesthesia Plan: General   Post-op Pain Management:    Induction: Intravenous, Rapid sequence and Cricoid pressure planned  PONV Risk Score and Plan: 2 and Ondansetron and Treatment may vary due to age or medical condition  Airway Management Planned: Double Lumen EBT  Additional Equipment: Arterial line  Intra-op Plan:   Post-operative Plan: Possible Post-op intubation/ventilation  Informed Consent: I have reviewed the patients History and Physical, chart, labs and discussed the procedure including the risks, benefits and alternatives for the proposed anesthesia with the patient or authorized representative who has indicated his/her understanding and acceptance.       Plan Discussed with: CRNA and Surgeon  Anesthesia Plan Comments:         Anesthesia Quick Evaluation

## 2020-05-25 NOTE — Progress Notes (Signed)
     [address]       [place] [address]             [phone]       undrained fluid collection due to persistent esophageal leak Remains tachy and tachypnic  Will need urgent chest washout, and repositioning of esophageal stent May require esophageal dilation  R VATS, EGD, esophageal stent repositioning, possible esophageal dilation  Fabrizzio Marcella O Seraphina Mitchner

## 2020-05-26 ENCOUNTER — Inpatient Hospital Stay (HOSPITAL_COMMUNITY): Payer: Medicaid Other

## 2020-05-26 LAB — CBC
HCT: 26.8 % — ABNORMAL LOW (ref 39.0–52.0)
Hemoglobin: 8.5 g/dL — ABNORMAL LOW (ref 13.0–17.0)
MCH: 27.1 pg (ref 26.0–34.0)
MCHC: 31.7 g/dL (ref 30.0–36.0)
MCV: 85.4 fL (ref 80.0–100.0)
Platelets: 549 10*3/uL — ABNORMAL HIGH (ref 150–400)
RBC: 3.14 MIL/uL — ABNORMAL LOW (ref 4.22–5.81)
RDW: 15.2 % (ref 11.5–15.5)
WBC: 19.5 10*3/uL — ABNORMAL HIGH (ref 4.0–10.5)
nRBC: 0.2 % (ref 0.0–0.2)

## 2020-05-26 LAB — BASIC METABOLIC PANEL
Anion gap: 10 (ref 5–15)
BUN: 11 mg/dL (ref 6–20)
CO2: 22 mmol/L (ref 22–32)
Calcium: 7.9 mg/dL — ABNORMAL LOW (ref 8.9–10.3)
Chloride: 103 mmol/L (ref 98–111)
Creatinine, Ser: 0.84 mg/dL (ref 0.61–1.24)
GFR calc Af Amer: >60 mL/min (ref >60)
GFR calc non Af Amer: >60 mL/min (ref >60)
Glucose, Bld: 115 mg/dL — ABNORMAL HIGH (ref 70–99)
Potassium: 4.3 mmol/L (ref 3.5–5.1)
Sodium: 135 mmol/L (ref 135–145)

## 2020-05-26 LAB — BLOOD GAS, ARTERIAL
Acid-base deficit: 0.1 mmol/L (ref 0.0–2.0)
Bicarbonate: 23.5 mmol/L (ref 20.0–28.0)
Drawn by: 27713
FIO2: 60
O2 Saturation: 96.7 %
Patient temperature: 37
pCO2 arterial: 34.9 mmHg (ref 32.0–48.0)
pH, Arterial: 7.443 (ref 7.350–7.450)
pO2, Arterial: 84.3 mmHg (ref 83.0–108.0)

## 2020-05-26 LAB — GLUCOSE, CAPILLARY
Glucose-Capillary: 116 mg/dL — ABNORMAL HIGH (ref 70–99)
Glucose-Capillary: 112 mg/dL — ABNORMAL HIGH (ref 70–99)
Glucose-Capillary: 119 mg/dL — ABNORMAL HIGH (ref 70–99)
Glucose-Capillary: 132 mg/dL — ABNORMAL HIGH (ref 70–99)
Glucose-Capillary: 151 mg/dL — ABNORMAL HIGH (ref 70–99)

## 2020-05-26 MED ORDER — OSMOLITE 1.5 CAL PO LIQD
1000.0000 mL | ORAL | Status: DC
  Administered 2020-05-26 – 2020-06-04 (×10): 1000 mL
  Filled 2020-05-26 (×16): qty 1000

## 2020-05-26 MED ORDER — ACETYLCYSTEINE 10% NICU INHALATION SOLUTION
2.0000 mL | Freq: Three times a day (TID) | RESPIRATORY_TRACT | Status: DC
  Filled 2020-05-26 (×2): qty 2

## 2020-05-26 MED ORDER — PROSOURCE TF PO LIQD
45.0000 mL | Freq: Two times a day (BID) | ORAL | Status: DC
  Administered 2020-05-26 – 2020-07-12 (×95): 45 mL
  Filled 2020-05-26 (×96): qty 45

## 2020-05-26 MED ORDER — ACETYLCYSTEINE 10 % IN SOLN
2.0000 mL | Freq: Three times a day (TID) | RESPIRATORY_TRACT | Status: DC
  Filled 2020-05-26 (×2): qty 2

## 2020-05-26 MED ORDER — METOPROLOL TARTRATE 5 MG/5ML IV SOLN
2.5000 mg | Freq: Three times a day (TID) | INTRAVENOUS | Status: DC
  Administered 2020-05-26 – 2020-05-27 (×4): 2.5 mg via INTRAVENOUS
  Filled 2020-05-26 (×4): qty 5

## 2020-05-26 MED ORDER — ACETYLCYSTEINE 20 % IN SOLN
2.0000 mL | Freq: Three times a day (TID) | RESPIRATORY_TRACT | Status: DC
  Administered 2020-05-26 – 2020-05-31 (×15): 2 mL via RESPIRATORY_TRACT
  Filled 2020-05-26 (×18): qty 4

## 2020-05-26 MED ORDER — OSMOLITE 1.2 CAL PO LIQD
1000.0000 mL | ORAL | Status: DC
Start: 2020-05-26 — End: 2020-05-26

## 2020-05-26 NOTE — Progress Notes (Signed)
Physical Therapy Treatment Patient Details Name: Evan Chavez MRN: [id number] DOB: 12-Mar-1961 Today's Date: 05/26/2020    History of Present Illness 59 yo male presents to the hospital on 7/28 with generalized weakness, productive cough and low-grade fever. Pt is currently being treated for hydropneumothorax, Sepsis aspiration pneumonia and esophagal rupture, AKI and acute delirium. Pt had successful R thoracentesis on 8/2. Pt had a chest tube put in on 8/2 and was removed 8/9. Pt also underwent a VATS, decortication, EGD, PEG tube placement, and a esophageal stent on 8/9. PMH of GERD, Barrett's esophagus, peptic ulcer disease with extensive gastric erosion, duodenal ulcer and esophageal stricture, also with history of prior stroke, hyperlipidemia, BPH, remote to cocaine abuse. Pt returned to OR on 8/13 for Right thoracoscopy, chest tube placement, esophageal stent placement.    PT Comments    Pt limited by fatigue with rapid desaturation with bed mobility as described below. Pt with improved vital signs but still reports significant fatigue after resting, declining exercise at bed level. Pt continues to require assistance for functional mobility and will benefit from continued acute PT services to improve activity tolerance and to reduce falls risk. PT recommends SNF placement at this time as the pt is currently unable to tolerate enough activity to participate in CIR level therapies due to impaired cardiopulmonary function.   Follow Up Recommendations  SNF (although may be CIR appropriate if activity tolerance improv)     Equipment Recommendations  Wheelchair (measurements PT);Wheelchair cushion (measurements PT);Hospital bed (if home today)    Recommendations for Other Services       Precautions / Restrictions Precautions Precautions: Fall Precaution Comments: R chest tube Restrictions Weight Bearing Restrictions: No    Mobility  Bed Mobility Overal bed mobility: Needs  Assistance Bed Mobility: Sit to Supine;Supine to Sit     Supine to sit: Mod assist Sit to supine: Total assist   General bed mobility comments: pt with totalA to return to supine as decompensating quickly  Transfers                    Ambulation/Gait                 Stairs             Wheelchair Mobility    Modified Rankin (Stroke Patients Only)       Balance Overall balance assessment: Needs assistance Sitting-balance support: Single extremity supported;Feet supported Sitting balance-Leahy Scale: Poor Sitting balance - Comments: reliant on UE support for sitting balance                                    Cognition Arousal/Alertness: Awake/alert Behavior During Therapy: Flat affect Overall Cognitive Status: Impaired/Different from baseline Area of Impairment: Problem solving;Awareness;Following commands                       Following Commands: Follows one step commands consistently   Awareness: Emergent Problem Solving: Slow processing        Exercises      General Comments General comments (skin integrity, edema, etc.): pt saturating in mid-90s at rest on 8L HFNC. Within 15 seconds of transitioning from supine to sitting the pt begins to desat down to 81%, RR elevated to 44. PT returns pt to supine with improvement in sats to 84%, needing to increase supplemental oxygen to 10L with gradual improvement in sats  to 93% over ~2-3 minutes of resting and encouragement of pursed lip breathing. PT able to wean pt back to 8L HFNC at end of session with stable sats and RR back to low 30s      Pertinent Vitals/Pain Pain Assessment: 0-10 Pain Score: 8  Pain Location: right side Pain Descriptors / Indicators: Sore Pain Intervention(s): RN gave pain meds during session    Home Living                      Prior Function            PT Goals (current goals can now be found in the care plan section) Acute Rehab PT  Goals Patient Stated Goal: none stated Progress towards PT goals: Not progressing toward goals - comment (limited by fatigue and SOB with activity)    Frequency    Min 3X/week      PT Plan Current plan remains appropriate    Co-evaluation              AM-PAC PT "6 Clicks" Mobility   Outcome Measure  Help needed turning from your back to your side while in a flat bed without using bedrails?: A Little Help needed moving from lying on your back to sitting on the side of a flat bed without using bedrails?: A Lot Help needed moving to and from a bed to a chair (including a wheelchair)?: A Lot Help needed standing up from a chair using your arms (e.g., wheelchair or bedside chair)?: A Lot Help needed to walk in hospital room?: A Lot Help needed climbing 3-5 steps with a railing? : A Lot 6 Click Score: 13    End of Session Equipment Utilized During Treatment: Oxygen Activity Tolerance: Patient limited by fatigue Patient left: in bed;with call bell/phone within reach;with bed alarm set Nurse Communication: Mobility status PT Visit Diagnosis: Unsteadiness on feet (R26.81);Muscle weakness (generalized) (M62.81)     Time: 7341-9379 PT Time Calculation (min) (ACUTE ONLY): 19 min  Charges:  $Therapeutic Activity: 8-22 mins                     Zenaida Niece, PT, DPT Acute Rehabilitation Pager: [phone]    Zenaida Niece 05/26/2020, 10:28 AM

## 2020-05-26 NOTE — Progress Notes (Signed)
PROGRESS NOTE  Evan Chavez  DOB: 1961-09-17  PCP: Rosita Fire, MD VQX:[id number]  DOA: 05/10/2020  LOS: 15 days   Chief Complaint  Patient presents with  . Shortness of Breath   Brief narrative: Evan Chavez is a 59 y.o. male with PMH of GERD, Barrett's esophagus, peptic ulcer disease with extensive gastric erosion, duodenal ulcer and esophageal stricture, also with history of prior stroke, hyperlipidemia, BPH, remote to cocaine abuse. He was hospitalized November 2019 with bibasal pneumonia and imaging showed esophageal stricture and impaired esophageal motility. EGD confirmed severe reflux esophagitis with peptic ulcer disease with extensive gastric erosions and duodenal ulcer.  Biopsy revealed Barrett's esophagus. He had a repeat EGD 12/2018 with balloon dilatation for peptic stricture, his dysphagia improved and surveillance EGD was advised.   He presented to the ED at [facility] on 7/28 with generalized weakness, productive cough and low-grade fever.   Chest x-ray showed bibasilar pneumonia, treated with amoxicillin and doxycycline.   He returned to the ED 7/29 due to increased shortness of breath.  Labs showed leukocytosis mild lactic acidosis.  CT angiogram of the chest showed bibasal consolidation and pleural effusions .  He was started on  ceftriaxone and azithromycin and admitted to hospital service.  8/2, patient had a successful right thoracentesis with 30 mL of cloudy yellow right pleural fluid drainage. Chest x-rays obtained between 8/2-8/4 showed worsening right-sided hydropneumothorax with possible lung collapse. 8/4, CT chest showed esophageal perforation arising from the right lateral aspect of the mid to distal esophagus and large right-sided hydropneumothorax.  It also showed findings consistent with aspiration pneumonia. 8/4 patient underwent right-sided chest tube placement.  He was transferred from [facility] to [facility] for CT surgery  evaluation. 8/5, patient underwent right video-assisted thoracoscopy, decortication, EGD, PEG tube placement, esophageal stent placement with a 25 X 125 mm stent by CT surgery. Patient was subsequently transferred hospitalist service.  8/10, patient had an episode of hematemesis  8/11, repeat esophagogram showed persistently leakage from esophagus to right pleural space. 8/13, patient underwent right thoracoscopy, drainage of loculated pleural effusion, esophagus gastroscopy, esophageal stent placement and esophagogram.  Subjective: Patient was seen and examined this morning.   Lying on bed.  Not in distress.  On 10 L oxygen by nasal collar.  Has a chest tube on right side.    Heart rate in 100 110 mostly, blood pressure 130s. Labs with WC count slightly low at 19.5, hemoglobin low at 8.5, creatinine normal at 0.84  Assessment/Plan: Esophageal rupture leading to right-sided hydropneumothorax Right-sided complex loculated parapneumonic effusion, freely flowing effusion on left -Diagnosed on 8/4.  -See timeline of events as above. -Cardiothoracic surgery following.  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   -Currently on normal saline at 75 mill per hour.  Continue to trend WBC and temperature trend. -Continue to monitor.  A. fib with RVR -It is unclear if patient had A. fib in the past but he was on oral Cardizem at home. -Because of mediastinitis, patient went into A. fib with RVR.  Cardizem drip was started. -Cardiology consultation obtained.  Switched from Cardizem drip to IV every 6 metoprolol this morning.   Acute blood loss anemia  -He has longstanding history of reflux esophagitis, esophageal stricture and underwent recently stenting. -Hemoglobin level more than 11 at baseline.  It has been low in this hospitalization secondary to esophageal rupture and surgery.  -8/11 hemoglobin was at  the  lowest at 6.3. 2 units of PRBC transfused.   -Repeat hemoglobin tomorrow.  Hemoglobin at 9.2 today. Recent Labs    05/17/20 2036 05/18/20 0341 05/19/20 0129 05/20/20 0524 05/21/20 0277 05/22/20 0418 05/23/20 0436 05/23/20 1930 05/24/20 0342 05/26/20 0331  HGB 9.9* 7.8* 7.1* 7.0* 7.8* 7.8* 6.3* 9.0* 9.2* 8.5*   Acute respiratory with hypoxia -Secondary to pneumonia, hydropneumothorax, effusion, anemia, persistent esophageal rupture. -Requiring 10 L oxygen by nasal cannula.  Recommend deep breathing exercises, incentive spirometry.  -Continue to wean down as tolerated.  Acute delirium -Patient has had episodes of confusion likely secondary to sepsis, prolonged hospitalization and previous stroke. -Currently slow to respond, oriented to place but not restless or agitated.  Continue reorientation efforts. -As needed IV Haldol.  Left hydronephrosis and needs ESWL -Noted in CT scan obtained while in the hospital.  Seen by urology. Lithotripsy was scheduled on 8/10 but patient is to remain in the hospital. -Patient will follow up with Urology on discharge.  Prior history of stroke -Prior to admission, patient was on aspirin 325 mg daily and Lipitor 40 mg daily. -Aspirin is currently on hold.  Will resume Lipitor post discharge.  Mobility: PT eval obtained.  CIR recommended. Code Status:   Code Status: Full Code  Nutritional status: Body mass index is 21.1 kg/m. Nutrition Problem: Moderate Malnutrition Etiology: chronic illness (Barrett's esophagus, GERD) Signs/Symptoms: moderate fat depletion, moderate muscle depletion Diet Order            Diet NPO time specified  Diet effective now                 DVT prophylaxis: SCD's Start: 05/25/20 1355 Place and maintain sequential compression device Start: 05/23/20 1040 SCDs Start: 05/11/20 0134   Antimicrobials:  IV Zosyn, fluconazole IV, vancomycin IV Fluid: Normal saline at 75 mL/h  Consultants: CT surgery,  cardiology Family Communication:  None at bedside  Incision (Closed) 05/17/20 Chest Right (Active)  Date First Assessed/Time First Assessed: 05/17/20 1821   Location: Chest  Location Orientation: Right    Assessments 05/17/2020  6:55 PM 05/26/2020  7:39 AM  Dressing Type Pressure dressing Gauze (Comment)  Dressing Clean;Dry;Intact Clean;Dry  Site / Wound Assessment Dressing in place / Unable to assess Dressing in place / Unable to assess  Drainage Amount None None     No Linked orders to display     Pressure Injury 05/17/20 Buttocks Right Stage 2 -  Partial thickness loss of dermis presenting as a shallow open injury with a red, pink wound bed without slough. (Active)  Date First Assessed/Time First Assessed: 05/17/20 0800   Location: Buttocks  Location Orientation: Right  Staging: Stage 2 -  Partial thickness loss of dermis presenting as a shallow open injury with a red, pink wound bed without slough.    Assessments 05/18/2020  8:00 AM 05/26/2020  7:39 AM  Dressing Type Foam - Lift dressing to assess site every shift Foam - Lift dressing to assess site every shift  Dressing Clean;Dry;Intact Clean;Dry;Intact     No Linked orders to display     Wound / Incision (Open or Dehisced) 05/19/20 Skin tear Buttocks Left (Active)  Date First Assessed/Time First Assessed: 05/19/20 1707   Wound Type: Skin tear  Location: Buttocks  Location Orientation: Left    Assessments 05/20/2020  8:30 AM 05/26/2020  7:39 AM  Dressing Type Foam - Lift dressing to assess site every shift None  Dressing Status Clean;Dry;Intact Clean;Dry;Intact  Site / Wound Assessment -- Clean;Dry  Peri-wound Assessment Intact Intact     No Linked orders to display     Incision (Closed) 05/25/20 Chest Right (Active)  Date First Assessed/Time First Assessed: 05/25/20 0950   Location: Chest  Location Orientation: Right    Assessments 05/25/2020 10:45 AM 05/26/2020  7:39 AM  Dressing Type Gauze (Comment);Tape dressing Gauze (Comment)   Dressing Clean;Intact;Dry Clean;Dry;Intact  Site / Wound Assessment Dressing in place / Unable to assess Dressing in place / Unable to assess  Drainage Amount None None     No Linked orders to display    Status is: Inpatient  Remains inpatient appropriate because:Ongoing diagnostic testing needed not appropriate for outpatient work up and IV treatments appropriate due to intensity of illness or inability to take PO   Dispo: The patient is from: Home              Anticipated d/c is to: SNF  Vs CIR              Anticipated d/c date is: More than 3 days              Patient currently is not medically stable to d/c.   Infusions:  . sodium chloride 70 mL/hr at 05/16/20 2122  . sodium chloride    . sodium chloride    . sodium chloride 75 mL/hr at 05/26/20 0600  . fluconazole (DIFLUCAN) IV 400 mg (05/26/20 0845)  . piperacillin-tazobactam (ZOSYN)  IV 3.375 g (05/26/20 0750)  . vancomycin 750 mg (05/26/20 0957)    Scheduled Meds: . acetylcysteine  2 mL Nebulization TID  . bisacodyl  10 mg Oral Daily  . insulin aspart  0-24 Units Subcutaneous TID AC & HS  . mouth rinse  15 mL Mouth Rinse q12n4p  . metoprolol tartrate  2.5 mg Intravenous Q8H  . pantoprazole (PROTONIX) IV  40 mg Intravenous Q12H  . sennosides  5 mL Per Tube QHS    Antimicrobials: Anti-infectives (From admission, onward)   Start     Dose/Rate Route Frequency Ordered Stop   05/24/20 2100  vancomycin (VANCOREADY) IVPB 750 mg/150 mL     Discontinue     750 mg 150 mL/hr over 60 Minutes Intravenous Every 12 hours 05/24/20 0813     05/24/20 0900  vancomycin (VANCOREADY) IVPB 1500 mg/300 mL        1,500 mg 150 mL/hr over 120 Minutes Intravenous  Once 05/24/20 0813 05/24/20 1221   05/17/20 0900  fluconazole (DIFLUCAN) IVPB 400 mg     Discontinue     400 mg 100 mL/hr over 120 Minutes Intravenous Every 24 hours 05/17/20 0749     05/17/20 0600  vancomycin (VANCOREADY) IVPB 750 mg/150 mL  Status:  Discontinued        750  mg 150 mL/hr over 60 Minutes Intravenous Every 8 hours 05/16/20 2154 05/17/20 0903   05/17/20 0400  vancomycin (VANCOREADY) IVPB 750 mg/150 mL  Status:  Discontinued        750 mg 150 mL/hr over 60 Minutes Intravenous Every 8 hours 05/16/20 1945 05/16/20 2154   05/16/20 2200  vancomycin (VANCOREADY) IVPB 1250 mg/250 mL        1,250 mg 166.7 mL/hr over 90 Minutes Intravenous STAT 05/16/20 2153 05/16/20 2348   05/16/20 2000  vancomycin (VANCOREADY) IVPB 1250 mg/250 mL  Status:  Discontinued        1,250 mg 166.7 mL/hr over 90 Minutes Intravenous  Once 05/16/20 1945 05/16/20 2153   05/15/20 1600  piperacillin-tazobactam (ZOSYN)  IVPB 3.375 g     Discontinue     3.375 g 12.5 mL/hr over 240 Minutes Intravenous Every 8 hours 05/15/20 0945     05/15/20 0930  piperacillin-tazobactam (ZOSYN) IVPB 3.375 g        3.375 g 100 mL/hr over 30 Minutes Intravenous  Once 05/15/20 0921 05/15/20 1009   05/13/20 2000  azithromycin (ZITHROMAX) tablet 500 mg        500 mg Oral Daily 05/13/20 1155 05/14/20 2127   05/10/20 2200  cefTRIAXone (ROCEPHIN) 2 g in sodium chloride 0.9 % 100 mL IVPB  Status:  Discontinued        2 g 200 mL/hr over 30 Minutes Intravenous Every 24 hours 05/10/20 2148 05/15/20 0911   05/10/20 2200  azithromycin (ZITHROMAX) 500 mg in sodium chloride 0.9 % 250 mL IVPB  Status:  Discontinued        500 mg 250 mL/hr over 60 Minutes Intravenous Every 24 hours 05/10/20 2148 05/13/20 1155      PRN meds: sodium chloride, Place/Maintain arterial line **AND** sodium chloride, Place/Maintain arterial line **AND** sodium chloride, [DISCONTINUED] acetaminophen **OR** acetaminophen, fentaNYL (SUBLIMAZE) injection, hydrALAZINE, ipratropium-albuterol, metoprolol tartrate, ondansetron (ZOFRAN) IV   Objective: Vitals:   05/26/20 1130 05/26/20 1200  BP:  (!) 143/85  Pulse:  97  Resp:  (!) 22  Temp:    SpO2: 91% 100%    Intake/Output Summary (Last 24 hours) at 05/26/2020 1242 Last data filed at  05/26/2020 1130 Gross per 24 hour  Intake 1332.92 ml  Output 1274 ml  Net 58.92 ml   Filed Weights   05/22/20 0320 05/23/20 0427 05/25/20 0459  Weight: 64 kg 65.6 kg 64.8 kg   Weight change:  Body mass index is 21.1 kg/m.   Physical Exam: General exam: Not in distress. Skin: No rashes, lesions or ulcers. HEENT: Atraumatic, normocephalic, supple neck, no obvious bleeding Lungs: Clear to auscultation bilaterally.  Right-sided chest tube in place. CVS: irregular tachycardia, no murmur  GI/Abd soft, nontender, nondistended, bowel sound present. PEG tube site intact. CNS: Alert, awake, oriented to place only.  Slow to respond. Not restless or agitated. Psychiatry: Depressed look Extremities: No pedal edema, no calf tenderness  Data Review: I have personally reviewed the laboratory data and studies available.  Recent Labs  Lab 05/20/20 0524 05/20/20 0524 05/21/20 0832 05/21/20 0832 05/22/20 0418 05/23/20 0436 05/23/20 1930 05/24/20 0342 05/26/20 0331  WBC 12.3*   < > 13.2*   < > 21.1* 21.6* 24.1* 25.7* 19.5*  NEUTROABS 10.4*  --  11.3*  --  18.8* 19.0*  --  22.7*  --   HGB 7.0*   < > 7.8*   < > 7.8* 6.3* 9.0* 9.2* 8.5*  HCT 23.5*   < > 25.8*   < > 25.4* 20.9* 27.6* 28.3* 26.8*  MCV 87.4   < > 86.0   < > 87.3 88.2 85.4 85.8 85.4  PLT 489*   < > 521*   < > 571* 582* 506* 488* 549*   < > = values in this interval not displayed.   Recent Labs  Lab 05/19/20 2026 05/19/20 2026 05/20/20 0524 05/20/20 0524 05/21/20 0832 05/22/20 0418 05/23/20 0436 05/23/20 0844 05/24/20 0342 05/26/20 0331  NA 147*   < > 144   < > 139 139 138  --  135 135  K 3.6   < > 4.5   < > 3.7 3.9 4.0  --  4.1 4.3  CL 114*   < >  110   < > 106 104 104  --  105 103  CO2 25   < > 22   < > 23 25 24   --  20* 22  GLUCOSE 133*   < > 139*   < > 147* 157* 153*  --  170* 115*  BUN 11   < > 10   < > 11 13 16   --  11 11  CREATININE 0.85   < > 0.90   < > 0.73 0.77 0.78  --  0.85 0.84  CALCIUM 8.0*   < > 8.2*    < > 8.4* 8.8* 8.6*  --  8.1* 7.9*  MG 2.0  --  1.7  --  1.7  --   --  1.8 2.1  --   PHOS <1.0*  --  3.3  --  3.5  --   --   --   --   --    < > = values in this interval not displayed.   Lab Results  Component Value Date   HGBA1C 5.5 07/12/2018       Component Value Date/Time   CHOL 124 07/13/2018 0524   TRIG 82 07/13/2018 0524   HDL 31 (L) 07/13/2018 0524   CHOLHDL 4.0 07/13/2018 0524   VLDL 16 07/13/2018 0524   LDLCALC 77 07/13/2018 0524    Signed, Terrilee Croak, MD [facility] Pager: [phone] (Secure Chat preferred). 05/26/2020

## 2020-05-26 NOTE — Progress Notes (Signed)
Nutrition Follow-up  DOCUMENTATION CODES:   Non-severe (moderate) malnutrition in context of chronic illness  INTERVENTION:   Resume tube feeding via PEG: - Osmolite 1.5 @ goal rate of 55 ml/hr  - ProSource TF 45 ml BID  Tube feeding regimenat goal provides 2060kcal, 105grams of protein, and 1066ml of H2O  NUTRITION DIAGNOSIS:   Moderate Malnutrition related to chronic illness (Barrett's esophagus, GERD) as evidenced by moderate fat depletion, moderate muscle depletion.  Ongoing  GOAL:   Patient will meet greater than or equal to 90% of their needs  Progressing   MONITOR:   Diet advancement, Labs, Weight trends, TF tolerance, Skin, I & O's  REASON FOR ASSESSMENT:   Consult Enteral/tube feeding initiation and management  ASSESSMENT:   59 year old male who presented on 7/28 with fever, SOB. PMH of stroke, HLD, GERD, Barrett's esophagus, PUD, s/p balloon stricturoplasty for GE junction in 2020. Admitted with severe sepsis secondary to bilateral pleural effusions with concomitant bibasilar pneumonia.  8/02 - MBSS with recommendation for regular diet with thin liquids, s/p thoracentesis 8/04 - s/p chest tube insertion, chest CT showing esophageal perforation 8/05 - s/p VATS, esophageal stent placement, PEG tube placement 8/14 - s/p Right thoracoscopy; Drainage of loculated pleural effusion; Esophagogastroscopy; 23 mm covered esophageal stent placement; Esophagram with Omnipaque   Reviewed I/O's: +1.3 L x 24 hours and +57 ml since 05/12/20  UOP: 950 ml x 24 hours  NGT output: 250 ml x 24 hours  Drain output: 30 ml x 24 hours  Chest tube output: 112 ml x 24 hours  Per CVTS notes, plan to re-start TF today after yesterday's procedure (re-do of VATS and repositioning of esophageal stent). Pt with likely mucous plugging on the right. Plan for swallow study on Monday (05/28/20).  Wt has been stable over the past week.   Medications reviewed and include 0.9% sodium  chloride infusion @ 75 ml/hr, senokot, and dulcolax.   Labs reviewed: CBGS: 112-116 (inpatient orders for glycemic control are 0-24 units insulin aspart TID before meals and at bedtime).   Diet Order:   Diet Order            Diet NPO time specified  Diet effective now                 EDUCATION NEEDS:   Education needs have been addressed  Skin:  Skin Assessment: Skin Integrity Issues: Skin Integrity Issues:: Stage II, Other (Comment) Stage II: R buttocks Incisions: right chest Other: skin tear to L buttocks  Last BM:  05/24/20  Height:   Ht Readings from Last 1 Encounters:  05/17/20 5\' 9"  (1.753 m)    Weight:   Wt Readings from Last 1 Encounters:  05/25/20 64.8 kg    Ideal Body Weight:  72.7 kg  BMI:  Body mass index is 21.1 kg/m.  Estimated Nutritional Needs:   Kcal:  2000-2200  Protein:  100-115 grams  Fluid:  >/= 2.0 L    Loistine Chance, RD, LDN, [place] Registered Dietitian II Certified Diabetes Care and Education Specialist Please refer to [facility] for RD and/or RD on-call/weekend/after hours pager

## 2020-05-26 NOTE — Progress Notes (Signed)
Progress Note  Patient Name: Evan Chavez Date of Encounter: 05/26/2020  Primary Cardiologist:   No primary care provider on file.   Subjective   He denies any SOB or pain.    Inpatient Medications    Scheduled Meds: . bisacodyl  10 mg Oral Daily  . insulin aspart  0-24 Units Subcutaneous TID AC & HS  . mouth rinse  15 mL Mouth Rinse q12n4p  . pantoprazole (PROTONIX) IV  40 mg Intravenous Q12H  . sennosides  5 mL Per Tube QHS   Continuous Infusions: . sodium chloride 70 mL/hr at 05/16/20 2122  . sodium chloride    . sodium chloride    . sodium chloride 75 mL/hr at 05/26/20 0600  . diltiazem (CARDIZEM) infusion 10 mg/hr (05/26/20 0744)  . fluconazole (DIFLUCAN) IV 400 mg (05/24/20 0807)  . piperacillin-tazobactam (ZOSYN)  IV 3.375 g (05/26/20 0750)  . vancomycin 750 mg (05/25/20 2147)   PRN Meds: sodium chloride, Place/Maintain arterial line **AND** sodium chloride, Place/Maintain arterial line **AND** sodium chloride, [DISCONTINUED] acetaminophen **OR** acetaminophen, fentaNYL (SUBLIMAZE) injection, hydrALAZINE, ipratropium-albuterol, metoprolol tartrate, ondansetron (ZOFRAN) IV   Vital Signs    Vitals:   05/26/20 0339 05/26/20 0400 05/26/20 0500 05/26/20 0739  BP: (!) 142/84 138/83 (!) 145/80 137/78  Pulse: 100 (!) 104 83 89  Resp: (!) 30 (!) 22 20 (!) 26  Temp: 98.6 F (37 C)   98.6 F (37 C)  TempSrc: Oral   Oral  SpO2: 96% 97% 100% 97%  Weight:      Height:        Intake/Output Summary (Last 24 hours) at 05/26/2020 0813 Last data filed at 05/26/2020 0600 Gross per 24 hour  Intake 2736.02 ml  Output 1392 ml  Net 1344.02 ml   Filed Weights   05/22/20 0320 05/23/20 0427 05/25/20 0459  Weight: 64 kg 65.6 kg 64.8 kg    Telemetry    NSR, with PACs, no atrial fib - Personally Reviewed  ECG    NA - Personally Reviewed  Physical Exam   GEN: No acute distress.   Neck: No  JVD Cardiac: RRR, no murmurs, rubs, or gallops.  Respiratory:      Decreased breath sounds right lung GI: Soft, nontender, non-distended  MS: N edema; No deformity. Neuro:  Nonfocal  Psych: Normal affect   Labs    Chemistry Recent Labs  Lab 05/19/20 2026 05/19/20 2026 05/20/20 0524 05/21/20 3474 05/23/20 0436 05/24/20 0342 05/26/20 0331  NA 147*   < > 144   < > 138 135 135  K 3.6   < > 4.5   < > 4.0 4.1 4.3  CL 114*   < > 110   < > 104 105 103  CO2 25   < > 22   < > 24 20* 22  GLUCOSE 133*   < > 139*   < > 153* 170* 115*  BUN 11   < > 10   < > 16 11 11   CREATININE 0.85   < > 0.90   < > 0.78 0.85 0.84  CALCIUM 8.0*   < > 8.2*   < > 8.6* 8.1* 7.9*  ALBUMIN 1.8*  --  1.9*  --   --   --   --   GFRNONAA >60   < > >60   < > >60 >60 >60  GFRAA >60   < > >60   < > >60 >60 >60  ANIONGAP 8   < >  12   < > 10 10 10    < > = values in this interval not displayed.     Hematology Recent Labs  Lab 05/23/20 1930 05/24/20 0342 05/26/20 0331  WBC 24.1* 25.7* 19.5*  RBC 3.23* 3.30* 3.14*  HGB 9.0* 9.2* 8.5*  HCT 27.6* 28.3* 26.8*  MCV 85.4 85.8 85.4  MCH 27.9 27.9 27.1  MCHC 32.6 32.5 31.7  RDW 14.6 14.9 15.2  PLT 506* 488* 549*    Cardiac EnzymesNo results for input(s): TROPONINI in the last 168 hours. No results for input(s): TROPIPOC in the last 168 hours.   BNPNo results for input(s): BNP, PROBNP in the last 168 hours.   DDimer No results for input(s): DDIMER in the last 168 hours.   Radiology    DG Chest 1 View  Result Date: 05/25/2020 CLINICAL DATA:  Esophageal stent placement. EXAM: DG C-ARM 1-60 MIN; CHEST  1 VIEW FLUOROSCOPY TIME:  Fluoro time: 4 Minutes 17 secondsRadiation exposure index: MGy: 41.69Number of acquired spot images: Five spot fluoro graphic images. COMPARISON:  May 17, 2020.  May 23, 2020 FINDINGS: Spot fluoroscopy images were obtained for surgical planning purposes. Esophageal stent is seen traversing the thorax. Enteric contrast traverses the stent and enters the proximal stomach. None of the provided radiographs  demonstrate evaluation of the site of previous leak into the RIGHT pleural space. Selective LEFT mainstem intubation. IMPRESSION: Spot fluoroscopy images from surgical planning purposes. Please reference procedure report for further details. Electronically Signed   By: Valentino Saxon MD   On: 05/25/2020 10:59   CT CHEST W CONTRAST  Result Date: 05/24/2020 CLINICAL DATA:  Esophageal perforation, progressive leukocytosis, abnormal chest x-ray, dyspnea EXAM: CT CHEST WITH CONTRAST TECHNIQUE: Multidetector CT imaging of the chest was performed during intravenous contrast administration. CONTRAST:  31mL OMNIPAQUE IOHEXOL 350 MG/ML SOLN COMPARISON:  Chest radiograph 05/22/2020, CT 05/10/2020 FINDINGS: Cardiovascular: Extensive coronary artery calcification. Global cardiac size within normal limits. Trace pericardial fluid is likely physiologic in is stable since prior examination. Thoracic aorta is age-appropriate. Mediastinum/Nodes: And endoluminal stent graft has been placed within the esophagus extending from the level of the sternal notch within the proximal esophagus through the mid to distal esophagus at the level of the left atrium. The stent graft is not completely appose the esophageal lumen proximally, best seen on axial image # 27/3. Additionally, there is a defect involving the right posterolateral aspect of the esophagus, best seen on axial image # 77/3, with an adjacent loculated collection of gas, fluid, and extravasated contrast measuring 4.2 x 7.6 x 8.4 cm on axial image # 82 and sagittal image # 77. The distal margin of the stent graft does appear to completely apposed the esophageal lumen, though this is extremely close to the point of perforation. The distal esophagus again demonstrates marked circumferential wall thickening in keeping with esophagitis. Numerous shotty enhancing right paratracheal lymph nodes are identified, likely reactive. No pathologic thoracic adenopathy. Largely cystic  nodule again identified within the left thyroid lobe, unchanged. Lungs/Pleura: Right chest tube is in place extending posteriorly and apically. There is collapse and consolidation of the right middle and lower lobes. There is partially loculated gas and fluid within the right pleural space laterally and within the major inter lobar fissure. The pleural fluid component is relatively small and communicates with the chest tube and has significantly decreased in volumes since prior examination, both in its free-flowing component as well as the a loculated component within the inter lobar fissure. The  previously noted extravasated collection, however, does appear isolated from the chest tube. Bronchial wall thickening within the aerated right upper lobe is in keeping with airway inflammation. The central airways of the right lower lobe appear impacted and fluid filled. Minimal infiltrate is seen within the basilar lingula and left lower lobe, infectious or inflammatory in nature. No central obstructing mass. No pneumothorax or pleural effusion on the left. Upper Abdomen: Left hydronephrosis is partially visualized, incompletely assessed on this examination. This appears similar to that noted on prior examination of 05/13/2020. Musculoskeletal: No acute bone abnormality. IMPRESSION: Interval placement of right chest tube with significant evacuation loculated right pleural fluid. A small free-flowing fluid in gas component remains as well as a small partially loculated component within the inter lobar fissure. Interval placement of an and luminal stent graft within the esophagus with incomplete apposition of the graft proximally likely resulting in contrast coursing between the stent graft and the esophageal lumen to the point of leakage noted on prior esophagram. Pilar Plate perforation of the right posterolateral mid to distal esophagus again noted. Associated loculated extravasated contrast and fluid collection within the  right pleural space appears isolated from the adjacent right chest tube. Collapse and consolidation of the right middle and right lower lobe with extensive fluid opacification of the right lower lobe airways. Airway inflammation involving the right upper lobe and probable minimal infectious or inflammatory infiltrate within the left lung base. Left hydronephrosis, similar to that noted on prior examination, incompletely evaluated on this examination. Electronically Signed   By: Fidela Salisbury MD   On: 05/24/2020 15:21   DG Chest Port 1 View  Result Date: 05/25/2020 CLINICAL DATA:  Leak EXAM: PORTABLE CHEST 1 VIEW COMPARISON:  May 23, 2020 FINDINGS: The cardiomediastinal silhouette is unchanged in contour.RIGHT-sided chest tubes. Moderate RIGHT pleural effusion. Esophageal stent projects over the mediastinum inferiorly. No pneumothorax. Persistent RIGHT-sided heterogeneous opacities are not significantly changed in comparison to prior. Minimal opacities of the LEFT lung. No acute osseous abnormality. IMPRESSION: Esophageal stent projects over the inferior mediastinum. Electronically Signed   By: Valentino Saxon MD   On: 05/25/2020 11:57   DG C-Arm 1-60 Min  Result Date: 05/25/2020 CLINICAL DATA:  Esophageal stent placement. EXAM: DG C-ARM 1-60 MIN; CHEST  1 VIEW FLUOROSCOPY TIME:  Fluoro time: 4 Minutes 17 secondsRadiation exposure index: MGy: 41.69Number of acquired spot images: Five spot fluoro graphic images. COMPARISON:  May 17, 2020.  May 23, 2020 FINDINGS: Spot fluoroscopy images were obtained for surgical planning purposes. Esophageal stent is seen traversing the thorax. Enteric contrast traverses the stent and enters the proximal stomach. None of the provided radiographs demonstrate evaluation of the site of previous leak into the RIGHT pleural space. Selective LEFT mainstem intubation. IMPRESSION: Spot fluoroscopy images from surgical planning purposes. Please reference procedure report  for further details. Electronically Signed   By: Valentino Saxon MD   On: 05/25/2020 10:59    Cardiac Studies   ECHO  1. Left ventricular ejection fraction, by estimation, is 65 to 70%. The  left ventricle has normal function. The left ventricle has no regional  wall motion abnormalities. Left ventricular diastolic parameters are  consistent with Grade I diastolic  dysfunction (impaired relaxation).  2. Right ventricular systolic function is normal. The right ventricular  size is normal. Tricuspid regurgitation signal is inadequate for assessing  PA pressure.  3. The mitral valve is grossly normal. Trivial mitral valve  regurgitation.  4. The aortic valve is tricuspid. Aortic valve regurgitation  is not  visualized.  5. The inferior vena cava is normal in size with greater than 50%  respiratory variability, suggesting right atrial pressure of 3 mmHg.    Patient Profile     59 y.o. male with a hx of prior stroke, HLD, GERD, Barrett's esophagus, PUD with gastric erosion, esophageal structure, BPH, history of alcohol/drug/tobacco abuse who is being seen for the evaluation of new onset afib at the request of Dr. Pietro Cassis.  Assessment & Plan    PAF:    Given this noted during this admission and his past history of probable embolic stroke, he would need DOAC in the future if/when he is a candidate.  Can discontinue IV Cardizem and use IV q 6 hours metoprolol.    For questions or updates, please contact [place] Please consult [email or web address] for contact info under Cardiology/STEMI.   Signed, Minus Breeding, MD  05/26/2020, 8:13 AM

## 2020-05-26 NOTE — Progress Notes (Signed)
°   °  [address]       [place] [address]             [phone]       No events overnight.  Today's Vitals   05/26/20 0339 05/26/20 0400 05/26/20 0500 05/26/20 0739  BP: (!) 142/84 138/83 (!) 145/80 137/78  Pulse: 100 (!) 104 83 89  Resp: (!) 30 (!) 22 20 (!) 26  Temp: 98.6 F (37 C)   98.6 F (37 C)  TempSrc: Oral   Oral  SpO2: 96% 97% 100% 97%  Weight:      Height:      PainSc:    0-No pain   Body mass index is 21.1 kg/m.  Arousable. Sinus Easy work of breathing.  Decreased breath sounds on the right Serosanguineous chest tube output.  Total of 110 ml from the [place] chest tube and 50 from the [facility] Abdomen soft  Chest x-ray reviewed. Right hemithorax is completely opacified.  59 year old male with an esophageal perforation.  Status post redo VATS and repositioning of esophageal stent.  Now with likely mucous plugging on the right.  Patient needs aggressive pulmonary toilet.  Chest physical therapy has been ordered along with Mucomyst and incentive spirometry. Patient needs to be up to chair today with assistance with ambulation. We will restart tube feeds Continue chest tube drainage. We will obtain swallow study on Monday  [place]

## 2020-05-26 NOTE — Anesthesia Postprocedure Evaluation (Signed)
Anesthesia Post Note  Patient: Tod Abrahamsen Selph  Procedure(s) Performed: ESOPHAGOGASTRODUODENOSCOPY (EGD); ESOPHAGEAL STENT REMOVAL. (N/A Esophagus) ESOPHAGEAL STENT PLACEMENT USING A 23 MM COVERED STENT (N/A Esophagus) VIDEO ASSISTED THORACOSCOPY (VATS)/EMPYEMA (Right Chest)     Patient location during evaluation: PACU Anesthesia Type: General Level of consciousness: patient cooperative Pain management: pain level controlled Vital Signs Assessment: post-procedure vital signs reviewed and stable Respiratory status: spontaneous breathing, respiratory function stable and patient connected to face mask oxygen Cardiovascular status: stable Postop Assessment: no apparent nausea or vomiting Anesthetic complications: no   No complications documented.  Last Vitals:  Vitals:   05/26/20 1900 05/26/20 2011  BP: 135/87 (!) 142/87  Pulse: (!) 109 (!) 107  Resp: 20 (!) 30  Temp: 36.9 C   SpO2: 97%     Last Pain:  Vitals:   05/26/20 1900  TempSrc: Oral  PainSc:                  Allora Bains

## 2020-05-27 ENCOUNTER — Inpatient Hospital Stay (HOSPITAL_COMMUNITY): Payer: Medicaid Other

## 2020-05-27 LAB — GLUCOSE, CAPILLARY
Glucose-Capillary: 135 mg/dL — ABNORMAL HIGH (ref 70–99)
Glucose-Capillary: 136 mg/dL — ABNORMAL HIGH (ref 70–99)
Glucose-Capillary: 142 mg/dL — ABNORMAL HIGH (ref 70–99)
Glucose-Capillary: 157 mg/dL — ABNORMAL HIGH (ref 70–99)
Glucose-Capillary: 160 mg/dL — ABNORMAL HIGH (ref 70–99)
Glucose-Capillary: 170 mg/dL — ABNORMAL HIGH (ref 70–99)

## 2020-05-27 LAB — COMPREHENSIVE METABOLIC PANEL
ALT: 313 U/L — ABNORMAL HIGH (ref 0–44)
AST: 209 U/L — ABNORMAL HIGH (ref 15–41)
Albumin: 1.8 g/dL — ABNORMAL LOW (ref 3.5–5.0)
Alkaline Phosphatase: 238 U/L — ABNORMAL HIGH (ref 38–126)
Anion gap: 10 (ref 5–15)
BUN: 12 mg/dL (ref 6–20)
CO2: 20 mmol/L — ABNORMAL LOW (ref 22–32)
Calcium: 8.2 mg/dL — ABNORMAL LOW (ref 8.9–10.3)
Chloride: 105 mmol/L (ref 98–111)
Creatinine, Ser: 0.79 mg/dL (ref 0.61–1.24)
GFR calc Af Amer: >60 mL/min (ref >60)
GFR calc non Af Amer: >60 mL/min (ref >60)
Glucose, Bld: 160 mg/dL — ABNORMAL HIGH (ref 70–99)
Potassium: 4 mmol/L (ref 3.5–5.1)
Sodium: 135 mmol/L (ref 135–145)
Total Bilirubin: 0.6 mg/dL (ref 0.3–1.2)
Total Protein: 6.5 g/dL (ref 6.5–8.1)

## 2020-05-27 LAB — CBC
HCT: 29.7 % — ABNORMAL LOW (ref 39.0–52.0)
Hemoglobin: 9.4 g/dL — ABNORMAL LOW (ref 13.0–17.0)
MCH: 27.8 pg (ref 26.0–34.0)
MCHC: 31.6 g/dL (ref 30.0–36.0)
MCV: 87.9 fL (ref 80.0–100.0)
Platelets: 573 10*3/uL — ABNORMAL HIGH (ref 150–400)
RBC: 3.38 MIL/uL — ABNORMAL LOW (ref 4.22–5.81)
RDW: 14.9 % (ref 11.5–15.5)
WBC: 13.5 10*3/uL — ABNORMAL HIGH (ref 4.0–10.5)
nRBC: 0.2 % (ref 0.0–0.2)

## 2020-05-27 MED ORDER — METOPROLOL TARTRATE 5 MG/5ML IV SOLN: 5.0000 mg | Freq: Four times a day (QID) | INTRAVENOUS | Status: DC

## 2020-05-27 MED ORDER — METOPROLOL TARTRATE 25 MG PO TABS
25.0000 mg | ORAL_TABLET | Freq: Two times a day (BID) | ORAL | Status: DC
  Administered 2020-05-27 – 2020-05-28 (×3): 25 mg
  Filled 2020-05-27 (×3): qty 1

## 2020-05-27 NOTE — Progress Notes (Signed)
Pharmacy Antibiotic Note  Evan Chavez is a 59 y.o. male with PNA (s/p VATs) on zosyn (day 12). He is now noted with increasing WBC and recent fever.  Pharmacy has been consulted for vancomycin dosing (D4).  WBC 13, afebrile. Scr 0.79 (CrCl 89 mL/min). No growth to date on most recent empyema cx; previous tissue cx showing rare candida parapsilosis (on fluconazole). Plan for swallow study tomorrow.   Plan: -Vancomycin 1500mg  IV x1 followed by 750mg  IV q12h - consider level soon if plan to continue  -Zosyn 3.375g IV every 8 hours -Will follow renal function, cultures and clinical progress   Height: 5\' 9"  (175.3 cm) Weight: 63.7 kg (140 lb 6.9 oz) IBW/kg (Calculated) : 70.7  Temp (24hrs), Avg:98.4 F (36.9 C), Min:97.5 F (36.4 C), Max:98.7 F (37.1 C)  Recent Labs  Lab 05/22/20 0418 05/22/20 0418 05/23/20 0436 05/23/20 1930 05/24/20 0342 05/26/20 0331 05/27/20 0332  WBC 21.1*   < > 21.6* 24.1* 25.7* 19.5* 13.5*  CREATININE 0.77  --  0.78  --  0.85 0.84 0.79   < > = values in this interval not displayed.    Estimated Creatinine Clearance: 89.6 mL/min (by C-G formula based on SCr of 0.79 mg/dL).    No Known Allergies  Antimicrobials this admission: Vancomycin 8/5, 8/12>> Zosyn 8/3 >> Ceftriaxone 7/30>>8/2 Azithromycin 7/30>>8/2 Fluconazole 8/5>>   Microbiology results: 7/29 BCx - negative 7/30 UCx - negative 8/2 pleural fluid - negative 8/4 MRSA PCR - negative 8/5 right chest empyema tissue - rare yeast 8/13 empyema fluid - no orgs to date   Thank you for allowing pharmacy to be a part of this patient's care.  Antonietta Jewel, PharmD, [place] Clinical Pharmacist  Phone: [phone] 05/27/2020 3:03 PM  Please check AMION for all [place] phone numbers After 10:00 PM, call [place] [phone]

## 2020-05-27 NOTE — Progress Notes (Addendum)
PROGRESS NOTE  Evan Chavez  DOB: [birth year]  PCP: Rosita Fire, MD KLK:[id number]  DOA: 05/10/2020  LOS: 16 days   Chief Complaint  Patient presents with  . Shortness of Breath   Brief narrative: Evan Chavez is a 59 y.o. male with PMH of GERD, Barrett's esophagus, peptic ulcer disease with extensive gastric erosion, duodenal ulcer and esophageal stricture, also with history of prior stroke, hyperlipidemia, BPH, remote to cocaine abuse. He was hospitalized November 2019 with bibasal pneumonia and imaging showed esophageal stricture and impaired esophageal motility. EGD confirmed severe reflux esophagitis with peptic ulcer disease with extensive gastric erosions and duodenal ulcer.  Biopsy revealed Barrett's esophagus. He had a repeat EGD 12/2018 with balloon dilatation for peptic stricture, his dysphagia improved and surveillance EGD was advised.   He presented to the ED at [facility] on 7/28 with generalized weakness, productive cough and low-grade fever.   Chest x-ray showed bibasilar pneumonia, treated with amoxicillin and doxycycline.   He returned to the ED 7/29 due to increased shortness of breath.  Labs showed leukocytosis mild lactic acidosis.  CT angiogram of the chest showed bibasal consolidation and pleural effusions .  He was started on  ceftriaxone and azithromycin and admitted to hospital service.  8/2, patient had a successful right thoracentesis with 30 mL of cloudy yellow right pleural fluid drainage. Chest x-rays obtained between 8/2-8/4 showed worsening right-sided hydropneumothorax with possible lung collapse. 8/4, CT chest showed esophageal perforation arising from the right lateral aspect of the mid to distal esophagus and large right-sided hydropneumothorax.  It also showed findings consistent with aspiration pneumonia. 8/4 patient underwent right-sided chest tube placement.  He was transferred from [facility] to [facility] for CT surgery  evaluation. 8/5, patient underwent right video-assisted thoracoscopy, decortication, EGD, PEG tube placement, esophageal stent placement with a 25 X 125 mm stent by CT surgery. Patient was subsequently transferred hospitalist service.  8/10, patient had an episode of hematemesis  8/11, repeat esophagogram showed persistently leakage from esophagus to right pleural space. 8/13, patient underwent right thoracoscopy, drainage of loculated pleural effusion, esophagus gastroscopy, esophageal stent placement and esophagogram.  Subjective: Patient was seen and examined this morning.   Not in distress but remains tachycardic, tachypneic, breathes shallow unless instructed.  No complaint. Has right-sided chest tube in place.  Currently on 4 L oxygen by nasal cannula. No fever last 24 hours.  Heart rate between 100 and 110 sinus tach.  Blood pressure in 140s and 150s. Labs this morning with albumin low at 1.8. AST/ALT/alk phos elevated.  WBC count improving to 13.5. Chest x-ray this morning showed a stable right-sided consolidation.    Assessment/Plan: Esophageal rupture leading to right-sided hydropneumothorax Right-sided complex loculated parapneumonic effusion, freely flowing effusion on left -Diagnosed on 8/4.  -See timeline of events as above. -Cardiothoracic surgery following.  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   -Currently on normal saline at 75 mill per hour.  I will stop IV fluid as he is also getting PEG tube feeding. -Continue to trend WBC and temperature trend. -Continue to monitor.  A. fib with RVR -It is unclear if patient had A. fib in the past but he was on oral Cardizem at home. -Because of mediastinitis, patient went into A. fib with RVR.   -Cardiology consultation obtained.   -Converted to sinus tachycardia with Cardizem drip. -Eventually switched from Cardizem drip to IV every  6 metoprolol.  Heart rate  100 to 110 overnight.   -Since patient is back on PEG tube now, I will switch him to metoprolol 25 mg twice daily by PEG tube.  Keep IV metoprolol 5 mg every 4 hours as needed.  -Patient will eventually required DOAC because of elevated risk of stroke.  Acute blood loss anemia  -He has longstanding history of reflux esophagitis, esophageal stricture and underwent recently stenting. -Hemoglobin level more than 11 at baseline.  It has been low in this hospitalization secondary to esophageal rupture and surgery.  -8/11 hemoglobin was at the lowest at 6.3. two units of PRBC transfused.   -Repeat hemoglobin tomorrow.  Hemoglobin at 9.4 today. Recent Labs    05/18/20 0341 05/19/20 0129 05/20/20 0524 05/21/20 1610 05/22/20 0418 05/23/20 0436 05/23/20 1930 05/24/20 0342 05/26/20 0331 05/27/20 0332  HGB 7.8* 7.1* 7.0* 7.8* 7.8* 6.3* 9.0* 9.2* 8.5* 9.4*   Elevated liver enzymes -Elevated AST/ALT and alk phos today.  Last check on 7/30 was normal.   -Unclear etiology.  Continue to monitor. -Last CT abdomen pelvis was on 05/13/2020 which did not show any hepatobiliary or pancreatic problem. Recent Labs  Lab 05/27/20 0332  AST 209*  ALT 313*  ALKPHOS 238*  BILITOT 0.6  PROT 6.5  ALBUMIN 1.8*   Acute respiratory with hypoxia -Secondary to pneumonia, hydropneumothorax, effusion, anemia, persistent esophageal rupture. -Currently on 4 L by nasal cannula.  Wean down as tolerated.   -Continue Mucomyst, flutter valve, and to breathing exercises.  Acute delirium -Patient has had episodes of confusion likely secondary to sepsis, prolonged hospitalization and previous stroke. -Currently slow to respond, oriented to place but not restless or agitated.  Continue reorientation efforts. -As needed IV Haldol.  Left hydronephrosis and needs ESWL -Noted in CT scan obtained while in the hospital.  Seen by urology. Lithotripsy was scheduled on 8/10 but patient is to remain in  the hospital. -Patient will follow up with Urology on discharge.  Prior history of stroke -Prior to admission, patient was on aspirin 325 mg daily and Lipitor 40 mg daily. -Aspirin is currently on hold.  Will resume Lipitor post discharge.  Mobility: PT eval obtained.  CIR recommended. Code Status:   Code Status: Full Code  Nutritional status: Hypoalbuminemia - Albumin low at 1.8 Moderate malnutrition Body mass index is 20.74 kg/m. Nutrition Problem: Moderate Malnutrition Etiology: chronic illness (Barrett's esophagus, GERD) Signs/Symptoms: moderate fat depletion, moderate muscle depletion Diet Order            Diet NPO time specified  Diet effective now                 DVT prophylaxis: SCD's Start: 05/25/20 1355 Place and maintain sequential compression device Start: 05/23/20 1040 SCDs Start: 05/11/20 0134   Antimicrobials:  IV Zosyn, fluconazole IV, vancomycin IV Fluid: Okay to stop IV fluid  Consultants: CT surgery, cardiology Family Communication:  None at bedside  Incision (Closed) 05/17/20 Chest Right (Active)  Date First Assessed/Time First Assessed: 05/17/20 1821   Location: Chest  Location Orientation: Right    Assessments 05/17/2020  6:55 PM 05/27/2020  4:00 AM  Dressing Type Pressure dressing Gauze (Comment)  Dressing Clean;Dry;Intact Clean;Dry;Intact  Dressing Change Frequency -- PRN  Site / Wound Assessment Dressing in place / Unable to assess Dressing in place / Unable to assess  Drainage Amount None None     No Linked orders to display     Pressure Injury 05/17/20 Buttocks Right Stage 2 -  Partial thickness loss of dermis  presenting as a shallow open injury with a red, pink wound bed without slough. (Active)  Date First Assessed/Time First Assessed: 05/17/20 0800   Location: Buttocks  Location Orientation: Right  Staging: Stage 2 -  Partial thickness loss of dermis presenting as a shallow open injury with a red, pink wound bed without slough.     Assessments 05/18/2020  8:00 AM 05/26/2020  7:39 AM  Dressing Type Foam - Lift dressing to assess site every shift Foam - Lift dressing to assess site every shift  Dressing Clean;Dry;Intact Clean;Dry;Intact     No Linked orders to display     Wound / Incision (Open or Dehisced) 05/19/20 Skin tear Buttocks Left (Active)  Date First Assessed/Time First Assessed: 05/19/20 1707   Wound Type: Skin tear  Location: Buttocks  Location Orientation: Left    Assessments 05/20/2020  8:30 AM 05/26/2020  7:39 AM  Dressing Type Foam - Lift dressing to assess site every shift None  Dressing Status Clean;Dry;Intact Clean;Dry;Intact  Site / Wound Assessment -- Clean;Dry  Peri-wound Assessment Intact Intact     No Linked orders to display     Incision (Closed) 05/25/20 Chest Right (Active)  Date First Assessed/Time First Assessed: 05/25/20 0950   Location: Chest  Location Orientation: Right    Assessments 05/25/2020 10:45 AM 05/27/2020  4:00 AM  Dressing Type Gauze (Comment);Tape dressing Gauze (Comment)  Dressing Clean;Intact;Dry Clean;Dry;Intact  Dressing Change Frequency -- PRN  Site / Wound Assessment Dressing in place / Unable to assess Dressing in place / Unable to assess  Drainage Amount None None     No Linked orders to display    Status is: Inpatient  Remains inpatient appropriate because:Ongoing diagnostic testing needed not appropriate for outpatient work up and IV treatments appropriate due to intensity of illness or inability to take PO   Dispo: The patient is from: Home              Anticipated d/c is to: SNF  Vs CIR              Anticipated d/c date is: More than 3 days              Patient currently is not medically stable to d/c.   Infusions:  . sodium chloride 70 mL/hr at 05/16/20 2122  . sodium chloride    . sodium chloride    . feeding supplement (OSMOLITE 1.5 CAL) 55 mL/hr at 05/27/20 0600  . fluconazole (DIFLUCAN) IV 400 mg (05/26/20 0845)  . piperacillin-tazobactam  (ZOSYN)  IV 3.375 g (05/27/20 0755)  . vancomycin 750 mg (05/26/20 2059)    Scheduled Meds: . acetylcysteine  2 mL Nebulization TID  . bisacodyl  10 mg Oral Daily  . feeding supplement (PROSource TF)  45 mL Per Tube BID  . insulin aspart  0-24 Units Subcutaneous TID AC & HS  . mouth rinse  15 mL Mouth Rinse q12n4p  . metoprolol tartrate  25 mg Per Tube BID  . pantoprazole (PROTONIX) IV  40 mg Intravenous Q12H  . sennosides  5 mL Per Tube QHS    Antimicrobials: Anti-infectives (From admission, onward)   Start     Dose/Rate Route Frequency Ordered Stop   05/24/20 2100  vancomycin (VANCOREADY) IVPB 750 mg/150 mL     Discontinue     750 mg 150 mL/hr over 60 Minutes Intravenous Every 12 hours 05/24/20 0813     05/24/20 0900  vancomycin (VANCOREADY) IVPB 1500 mg/300 mL  1,500 mg 150 mL/hr over 120 Minutes Intravenous  Once 05/24/20 0813 05/24/20 1221   05/17/20 0900  fluconazole (DIFLUCAN) IVPB 400 mg     Discontinue     400 mg 100 mL/hr over 120 Minutes Intravenous Every 24 hours 05/17/20 0749     05/17/20 0600  vancomycin (VANCOREADY) IVPB 750 mg/150 mL  Status:  Discontinued        750 mg 150 mL/hr over 60 Minutes Intravenous Every 8 hours 05/16/20 2154 05/17/20 0903   05/17/20 0400  vancomycin (VANCOREADY) IVPB 750 mg/150 mL  Status:  Discontinued        750 mg 150 mL/hr over 60 Minutes Intravenous Every 8 hours 05/16/20 1945 05/16/20 2154   05/16/20 2200  vancomycin (VANCOREADY) IVPB 1250 mg/250 mL        1,250 mg 166.7 mL/hr over 90 Minutes Intravenous STAT 05/16/20 2153 05/16/20 2348   05/16/20 2000  vancomycin (VANCOREADY) IVPB 1250 mg/250 mL  Status:  Discontinued        1,250 mg 166.7 mL/hr over 90 Minutes Intravenous  Once 05/16/20 1945 05/16/20 2153   05/15/20 1600  piperacillin-tazobactam (ZOSYN) IVPB 3.375 g     Discontinue     3.375 g 12.5 mL/hr over 240 Minutes Intravenous Every 8 hours 05/15/20 0945     05/15/20 0930  piperacillin-tazobactam (ZOSYN) IVPB  3.375 g        3.375 g 100 mL/hr over 30 Minutes Intravenous  Once 05/15/20 0921 05/15/20 1009   05/13/20 2000  azithromycin (ZITHROMAX) tablet 500 mg        500 mg Oral Daily 05/13/20 1155 05/14/20 2127   05/10/20 2200  cefTRIAXone (ROCEPHIN) 2 g in sodium chloride 0.9 % 100 mL IVPB  Status:  Discontinued        2 g 200 mL/hr over 30 Minutes Intravenous Every 24 hours 05/10/20 2148 05/15/20 0911   05/10/20 2200  azithromycin (ZITHROMAX) 500 mg in sodium chloride 0.9 % 250 mL IVPB  Status:  Discontinued        500 mg 250 mL/hr over 60 Minutes Intravenous Every 24 hours 05/10/20 2148 05/13/20 1155      PRN meds: sodium chloride, Place/Maintain arterial line **AND** sodium chloride, Place/Maintain arterial line **AND** sodium chloride, [DISCONTINUED] acetaminophen **OR** acetaminophen, fentaNYL (SUBLIMAZE) injection, hydrALAZINE, ipratropium-albuterol, metoprolol tartrate, ondansetron (ZOFRAN) IV   Objective: Vitals:   05/27/20 0900 05/27/20 0915  BP: (!) 149/90   Pulse: 92 (!) 136  Resp: (!) 22 (!) 30  Temp:    SpO2: 96% 95%    Intake/Output Summary (Last 24 hours) at 05/27/2020 0919 Last data filed at 05/27/2020 0900 Gross per 24 hour  Intake 6144.93 ml  Output 2454 ml  Net 3690.93 ml   Filed Weights   05/25/20 0459 05/26/20 2300 05/27/20 0500  Weight: 64.8 kg 63.7 kg 63.7 kg   Weight change:  Body mass index is 20.74 kg/m.   Physical Exam: General exam: Appears comfortable.  Not in physical distress. Skin: No rashes, lesions or ulcers. HEENT: Atraumatic, normocephalic, supple neck, no obvious bleeding Lungs: Shallow breathing.  Clear to auscultation of the left.  Chest tube on the right  CVS: Regular tachycardia, no murmur  GI/Abd soft, nontender, nondistended, bowel sound present. PEG tube site intact. CNS: Alert, awake, oriented to place only.  Slow to respond. Not restless or agitated. Psychiatry: Depressed look Extremities: No pedal edema, no calf  tenderness  Data Review: I have personally reviewed the laboratory data and studies available.  Recent Labs  Lab 05/21/20 0832 05/21/20 0832 05/22/20 0418 05/22/20 0418 05/23/20 0436 05/23/20 1930 05/24/20 0342 05/26/20 0331 05/27/20 0332  WBC 13.2*   < > 21.1*   < > 21.6* 24.1* 25.7* 19.5* 13.5*  NEUTROABS 11.3*  --  18.8*  --  19.0*  --  22.7*  --   --   HGB 7.8*   < > 7.8*   < > 6.3* 9.0* 9.2* 8.5* 9.4*  HCT 25.8*   < > 25.4*   < > 20.9* 27.6* 28.3* 26.8* 29.7*  MCV 86.0   < > 87.3   < > 88.2 85.4 85.8 85.4 87.9  PLT 521*   < > 571*   < > 582* 506* 488* 549* 573*   < > = values in this interval not displayed.   Recent Labs  Lab 05/21/20 0832 05/21/20 0832 05/22/20 0418 05/23/20 0436 05/23/20 0844 05/24/20 0342 05/26/20 0331 05/27/20 0332  NA 139   < > 139 138  --  135 135 135  K 3.7   < > 3.9 4.0  --  4.1 4.3 4.0  CL 106   < > 104 104  --  105 103 105  CO2 23   < > 25 24  --  20* 22 20*  GLUCOSE 147*   < > 157* 153*  --  170* 115* 160*  BUN 11   < > 13 16  --  '11 11 12  ' CREATININE 0.73   < > 0.77 0.78  --  0.85 0.84 0.79  CALCIUM 8.4*   < > 8.8* 8.6*  --  8.1* 7.9* 8.2*  MG 1.7  --   --   --  1.8 2.1  --   --   PHOS 3.5  --   --   --   --   --   --   --    < > = values in this interval not displayed.   Lab Results  Component Value Date   HGBA1C 5.5 07/12/2018       Component Value Date/Time   CHOL 124 07/13/2018 0524   TRIG 82 07/13/2018 0524   HDL 31 (L) 07/13/2018 0524   CHOLHDL 4.0 07/13/2018 0524   VLDL 16 07/13/2018 0524   LDLCALC 77 07/13/2018 0524    Signed, Terrilee Croak, MD [facility] Pager: [phone] (Secure Chat preferred). 05/27/2020

## 2020-05-27 NOTE — Progress Notes (Signed)
RT attempted to perform CPT via the chest vest on pt and pt declined stating he does not like it and does not want to do. RT expressed to the pt the importance of CPT for him, especially with a chest tube and his immobility at this time. Pt still declined. RT attempted to coach pt on good cough technique to optimize his ability to expectorate any secretions, pt not cooperative or interested in learning. Currently pts respiratory status is stable w/exception of his tachypnea, but appears to be in no distress at this time on RA. RT will continue to monitor.

## 2020-05-27 NOTE — Progress Notes (Signed)
°   °  [address]       [place] [address]             [phone]       No events overnight.  Today's Vitals   05/27/20 0400 05/27/20 0500 05/27/20 0700 05/27/20 0736  BP: (!) 151/99  (!) 147/92   Pulse: (!) 108  (!) 107   Resp: (!) 21  (!) 26   Temp: 98.7 F (37.1 C)  98.6 F (37 C)   TempSrc: Oral  Oral   SpO2: 98%  95% 96%  Weight:  63.7 kg    Height:      PainSc:   (P) 0-No pain    Body mass index is 20.74 kg/m.  Arousable. Sinus Easy work of breathing.  Decreased breath sounds on the right Serosanguineous chest tube output.  Total of 54 mL Abdomen soft  Chest x-ray reviewed. Right hemithorax is completely opacified.  59 year old male with an esophageal perforation.  Status post redo VATS and repositioning of esophageal stent.  Now with likely mucous plugging on the right.  Patient needs aggressive pulmonary toilet.  Chest physical therapy has been ordered along with Mucomyst and incentive spirometry. Patient needs to be up to chair today with assistance with ambulation. Continue tube feeds Continue chest tube drainage. We will obtain swallow study on Monday  [place]

## 2020-05-27 NOTE — Progress Notes (Signed)
Progress Note  Patient Name: Evan Chavez Date of Encounter: 05/27/2020  Primary Cardiologist:   No primary care provider on file.   Subjective   Not verbally answering questions today but denies pain or SOB with a head shake.   Inpatient Medications    Scheduled Meds: . acetylcysteine  2 mL Nebulization TID  . bisacodyl  10 mg Oral Daily  . feeding supplement (PROSource TF)  45 mL Per Tube BID  . insulin aspart  0-24 Units Subcutaneous TID AC & HS  . mouth rinse  15 mL Mouth Rinse q12n4p  . metoprolol tartrate  2.5 mg Intravenous Q8H  . pantoprazole (PROTONIX) IV  40 mg Intravenous Q12H  . sennosides  5 mL Per Tube QHS   Continuous Infusions: . sodium chloride 70 mL/hr at 05/16/20 2122  . sodium chloride    . sodium chloride    . sodium chloride 75 mL/hr at 05/27/20 0600  . feeding supplement (OSMOLITE 1.5 CAL) 55 mL/hr at 05/27/20 0600  . fluconazole (DIFLUCAN) IV 400 mg (05/26/20 0845)  . piperacillin-tazobactam (ZOSYN)  IV 3.375 g (05/27/20 0755)  . vancomycin 750 mg (05/26/20 2059)   PRN Meds: sodium chloride, Place/Maintain arterial line **AND** sodium chloride, Place/Maintain arterial line **AND** sodium chloride, [DISCONTINUED] acetaminophen **OR** acetaminophen, fentaNYL (SUBLIMAZE) injection, hydrALAZINE, ipratropium-albuterol, metoprolol tartrate, ondansetron (ZOFRAN) IV   Vital Signs    Vitals:   05/27/20 0400 05/27/20 0500 05/27/20 0700 05/27/20 0736  BP: (!) 151/99  (!) 147/92   Pulse: (!) 108  (!) 107   Resp: (!) 21  (!) 26   Temp: 98.7 F (37.1 C)  98.6 F (37 C)   TempSrc: Oral  Oral   SpO2: 98%  95% 96%  Weight:  63.7 kg    Height:        Intake/Output Summary (Last 24 hours) at 05/27/2020 0831 Last data filed at 05/27/2020 0600 Gross per 24 hour  Intake 6134.93 ml  Output 2454 ml  Net 3680.93 ml   Filed Weights   05/25/20 0459 05/26/20 2300 05/27/20 0500  Weight: 64.8 kg 63.7 kg 63.7 kg    Telemetry    NSR, sinus tach -  Personally Reviewed  ECG    NA - Personally Reviewed  Physical Exam   GEN: No acute distress.   Neck: No JVD Cardiac: RRR,  murmurs, rubs, or gallops.  Respiratory:    Decreased breath sounds right greater than left leg. GI: Soft, nontender, non-distended, normal bowel sounds  MS:  No edema; No deformity. Neuro:   Nonfocal  Psych: Oriented and appropriate    Labs    Chemistry Recent Labs  Lab 05/24/20 0342 05/26/20 0331 05/27/20 0332  NA 135 135 135  K 4.1 4.3 4.0  CL 105 103 105  CO2 20* 22 20*  GLUCOSE 170* 115* 160*  BUN 11 11 12   CREATININE 0.85 0.84 0.79  CALCIUM 8.1* 7.9* 8.2*  PROT  --   --  6.5  ALBUMIN  --   --  1.8*  AST  --   --  209*  ALT  --   --  313*  ALKPHOS  --   --  238*  BILITOT  --   --  0.6  GFRNONAA >60 >60 >60  GFRAA >60 >60 >60  ANIONGAP 10 10 10      Hematology Recent Labs  Lab 05/24/20 0342 05/26/20 0331 05/27/20 0332  WBC 25.7* 19.5* 13.5*  RBC 3.30* 3.14* 3.38*  HGB 9.2* 8.5*  9.4*  HCT 28.3* 26.8* 29.7*  MCV 85.8 85.4 87.9  MCH 27.9 27.1 27.8  MCHC 32.5 31.7 31.6  RDW 14.9 15.2 14.9  PLT 488* 549* 573*    Cardiac EnzymesNo results for input(s): TROPONINI in the last 168 hours. No results for input(s): TROPIPOC in the last 168 hours.   BNPNo results for input(s): BNP, PROBNP in the last 168 hours.   DDimer No results for input(s): DDIMER in the last 168 hours.   Radiology    DG Chest 1 View  Result Date: 05/26/2020 CLINICAL DATA:  Esophageal anastomotic leak EXAM: CHEST  1 VIEW COMPARISON:  05/25/2020 FINDINGS: Right chest tubes and esophageal stent are again noted. There is new near total opacification of the right hemithorax. Right pleural effusion is present. Left lung remains clear. No pneumothorax. Cardiomediastinal contours are partially obscured. There is mild rightward mediastinal shift. IMPRESSION: New near total opacification of the right hemithorax likely reflecting combination of effusion and atelectasis.  Electronically Signed   By: Macy Mis M.D.   On: 05/26/2020 08:21   DG Chest 1 View  Result Date: 05/25/2020 CLINICAL DATA:  Esophageal stent placement. EXAM: DG C-ARM 1-60 MIN; CHEST  1 VIEW FLUOROSCOPY TIME:  Fluoro time: 4 Minutes 17 secondsRadiation exposure index: MGy: 41.69Number of acquired spot images: Five spot fluoro graphic images. COMPARISON:  May 17, 2020.  May 23, 2020 FINDINGS: Spot fluoroscopy images were obtained for surgical planning purposes. Esophageal stent is seen traversing the thorax. Enteric contrast traverses the stent and enters the proximal stomach. None of the provided radiographs demonstrate evaluation of the site of previous leak into the RIGHT pleural space. Selective LEFT mainstem intubation. IMPRESSION: Spot fluoroscopy images from surgical planning purposes. Please reference procedure report for further details. Electronically Signed   By: Valentino Saxon MD   On: 05/25/2020 10:59   DG Chest Port 1 View  Result Date: 05/27/2020 CLINICAL DATA:  Shortness of breath EXAM: PORTABLE CHEST 1 VIEW COMPARISON:  05/26/2020 FINDINGS: Cardiac shadow is stable. Esophageal stent is again noted and stable. Two right-sided chest tubes are seen in satisfactory position. The overall appearance is stable. Persistent opacification of the right hemithorax is again noted similar to that seen on recent plain film evaluation. No new focal abnormality is noted. IMPRESSION: Stable appearance of the chest with consolidation in the right hemithorax. Electronically Signed   By: Inez Catalina M.D.   On: 05/27/2020 07:22   DG Chest Port 1 View  Result Date: 05/25/2020 CLINICAL DATA:  Leak EXAM: PORTABLE CHEST 1 VIEW COMPARISON:  May 23, 2020 FINDINGS: The cardiomediastinal silhouette is unchanged in contour.RIGHT-sided chest tubes. Moderate RIGHT pleural effusion. Esophageal stent projects over the mediastinum inferiorly. No pneumothorax. Persistent RIGHT-sided heterogeneous opacities  are not significantly changed in comparison to prior. Minimal opacities of the LEFT lung. No acute osseous abnormality. IMPRESSION: Esophageal stent projects over the inferior mediastinum. Electronically Signed   By: Valentino Saxon MD   On: 05/25/2020 11:57   DG C-Arm 1-60 Min  Result Date: 05/25/2020 CLINICAL DATA:  Esophageal stent placement. EXAM: DG C-ARM 1-60 MIN; CHEST  1 VIEW FLUOROSCOPY TIME:  Fluoro time: 4 Minutes 17 secondsRadiation exposure index: MGy: 41.69Number of acquired spot images: Five spot fluoro graphic images. COMPARISON:  May 17, 2020.  May 23, 2020 FINDINGS: Spot fluoroscopy images were obtained for surgical planning purposes. Esophageal stent is seen traversing the thorax. Enteric contrast traverses the stent and enters the proximal stomach. None of the provided radiographs demonstrate evaluation  of the site of previous leak into the RIGHT pleural space. Selective LEFT mainstem intubation. IMPRESSION: Spot fluoroscopy images from surgical planning purposes. Please reference procedure report for further details. Electronically Signed   By: Valentino Saxon MD   On: 05/25/2020 10:59    Cardiac Studies   ECHO  1. Left ventricular ejection fraction, by estimation, is 65 to 70%. The  left ventricle has normal function. The left ventricle has no regional  wall motion abnormalities. Left ventricular diastolic parameters are  consistent with Grade I diastolic  dysfunction (impaired relaxation).  2. Right ventricular systolic function is normal. The right ventricular  size is normal. Tricuspid regurgitation signal is inadequate for assessing  PA pressure.  3. The mitral valve is grossly normal. Trivial mitral valve  regurgitation.  4. The aortic valve is tricuspid. Aortic valve regurgitation is not  visualized.  5. The inferior vena cava is normal in size with greater than 50%  respiratory variability, suggesting right atrial pressure of 3 mmHg.    Patient  Profile     59 y.o. male with a hx of prior stroke, HLD, GERD, Barrett's esophagus, PUD with gastric erosion, esophageal structure, BPH, history of alcohol/drug/tobacco abuse who is being seen for the evaluation of new onset afib at the request of Dr. Pietro Cassis.  Assessment & Plan    PAF:    No further atrial fib.  Stopped IV Dilt yesterday.  On IV scheduled beta blocker.  Sinus tach.   Increase beta blocker and consolidate to PO when able.   He will need DOAC when able.      For questions or updates, please contact [place] Please consult [email or web address] for contact info under Cardiology/STEMI.   Signed, Minus Breeding, MD  05/27/2020, 8:31 AM

## 2020-05-28 ENCOUNTER — Inpatient Hospital Stay (HOSPITAL_COMMUNITY): Payer: Medicaid Other

## 2020-05-28 ENCOUNTER — Encounter (HOSPITAL_COMMUNITY): Payer: Self-pay | Admitting: Thoracic Surgery (Cardiothoracic Vascular Surgery)

## 2020-05-28 LAB — GLUCOSE, CAPILLARY
Glucose-Capillary: 141 mg/dL — ABNORMAL HIGH (ref 70–99)
Glucose-Capillary: 162 mg/dL — ABNORMAL HIGH (ref 70–99)
Glucose-Capillary: 119 mg/dL — ABNORMAL HIGH (ref 70–99)
Glucose-Capillary: 137 mg/dL — ABNORMAL HIGH (ref 70–99)
Glucose-Capillary: 142 mg/dL — ABNORMAL HIGH (ref 70–99)
Glucose-Capillary: 128 mg/dL — ABNORMAL HIGH (ref 70–99)

## 2020-05-28 LAB — COMPREHENSIVE METABOLIC PANEL
ALT: 247 U/L — ABNORMAL HIGH (ref 0–44)
AST: 65 U/L — ABNORMAL HIGH (ref 15–41)
Albumin: 1.8 g/dL — ABNORMAL LOW (ref 3.5–5.0)
Alkaline Phosphatase: 252 U/L — ABNORMAL HIGH (ref 38–126)
Anion gap: 10 (ref 5–15)
BUN: 10 mg/dL (ref 6–20)
CO2: 21 mmol/L — ABNORMAL LOW (ref 22–32)
Calcium: 8 mg/dL — ABNORMAL LOW (ref 8.9–10.3)
Chloride: 105 mmol/L (ref 98–111)
Creatinine, Ser: 0.82 mg/dL (ref 0.61–1.24)
GFR calc Af Amer: >60 mL/min (ref >60)
GFR calc non Af Amer: >60 mL/min (ref >60)
Glucose, Bld: 141 mg/dL — ABNORMAL HIGH (ref 70–99)
Potassium: 4.3 mmol/L (ref 3.5–5.1)
Sodium: 136 mmol/L (ref 135–145)
Total Bilirubin: 0.3 mg/dL (ref 0.3–1.2)
Total Protein: 6.7 g/dL (ref 6.5–8.1)

## 2020-05-28 LAB — CBC WITH DIFFERENTIAL/PLATELET
Abs Immature Granulocytes: 0.12 10*3/uL — ABNORMAL HIGH (ref 0.00–0.07)
Basophils Absolute: 0.1 10*3/uL (ref 0.0–0.1)
Basophils Relative: 0 %
Eosinophils Absolute: 0 10*3/uL (ref 0.0–0.5)
Eosinophils Relative: 0 %
HCT: 32.7 % — ABNORMAL LOW (ref 39.0–52.0)
Hemoglobin: 10.5 g/dL — ABNORMAL LOW (ref 13.0–17.0)
Immature Granulocytes: 1 %
Lymphocytes Relative: 6 %
Lymphs Abs: 1.1 10*3/uL (ref 0.7–4.0)
MCH: 27.7 pg (ref 26.0–34.0)
MCHC: 32.1 g/dL (ref 30.0–36.0)
MCV: 86.3 fL (ref 80.0–100.0)
Monocytes Absolute: 1.4 10*3/uL — ABNORMAL HIGH (ref 0.1–1.0)
Monocytes Relative: 7 %
Neutro Abs: 17.6 10*3/uL — ABNORMAL HIGH (ref 1.7–7.7)
Neutrophils Relative %: 86 %
Platelets: 545 10*3/uL — ABNORMAL HIGH (ref 150–400)
RBC: 3.79 MIL/uL — ABNORMAL LOW (ref 4.22–5.81)
RDW: 15 % (ref 11.5–15.5)
WBC: 20.3 10*3/uL — ABNORMAL HIGH (ref 4.0–10.5)
nRBC: 0 % (ref 0.0–0.2)

## 2020-05-28 LAB — PHOSPHORUS: Phosphorus: 2.2 mg/dL — ABNORMAL LOW (ref 2.5–4.6)

## 2020-05-28 LAB — MAGNESIUM: Magnesium: 2.1 mg/dL (ref 1.7–2.4)

## 2020-05-28 MED ORDER — METOPROLOL TARTRATE 50 MG PO TABS
50.0000 mg | ORAL_TABLET | Freq: Two times a day (BID) | ORAL | Status: DC
  Administered 2020-05-28 – 2020-06-14 (×34): 50 mg
  Filled 2020-05-28 (×34): qty 1

## 2020-05-28 MED ORDER — IOHEXOL 300 MG/ML  SOLN
150.0000 mL | Freq: Once | INTRAMUSCULAR | Status: AC | PRN
  Administered 2020-05-28: 25 mL via ORAL

## 2020-05-28 NOTE — Progress Notes (Signed)
PROGRESS NOTE  Juanluis Guastella Bienaime  DOB: Aug 02, 1961  PCP: Rosita Fire, MD IWL:[id number]  DOA: 05/10/2020  LOS: 17 days   Chief Complaint  Patient presents with  . Shortness of Breath   Brief narrative: Evan Chavez is a 59 y.o. male with PMH of GERD, Barrett's esophagus, peptic ulcer disease with extensive gastric erosion, duodenal ulcer and esophageal stricture, also with history of prior stroke, hyperlipidemia, BPH, remote to cocaine abuse. He was hospitalized November 2019 with bibasal pneumonia and imaging showed esophageal stricture and impaired esophageal motility. EGD confirmed severe reflux esophagitis with peptic ulcer disease with extensive gastric erosions and duodenal ulcer.  Biopsy revealed Barrett's esophagus. He had a repeat EGD 12/2018 with balloon dilatation for peptic stricture, his dysphagia improved and surveillance EGD was advised.   He presented to the ED at [facility] on 7/28 with generalized weakness, productive cough and low-grade fever.   Chest x-ray showed bibasilar pneumonia, treated with amoxicillin and doxycycline.   He returned to the ED 7/29 due to increased shortness of breath.  Labs showed leukocytosis mild lactic acidosis.  CT angiogram of the chest showed bibasal consolidation and pleural effusions .  He was started on  ceftriaxone and azithromycin and admitted to hospital service.  8/2, patient had a successful right thoracentesis with 30 mL of cloudy yellow right pleural fluid drainage. Chest x-rays obtained between 8/2-8/4 showed worsening right-sided hydropneumothorax with possible lung collapse. 8/4, CT chest showed esophageal perforation arising from the right lateral aspect of the mid to distal esophagus and large right-sided hydropneumothorax.  It also showed findings consistent with aspiration pneumonia. 8/4 patient underwent right-sided chest tube placement.  He was transferred from [facility] to [facility] for CT surgery  evaluation. 8/5, patient underwent right video-assisted thoracoscopy, decortication, EGD, PEG tube placement, esophageal stent placement with a 25 X 125 mm stent by CT surgery. Patient was subsequently transferred hospitalist service.  8/10, patient had an episode of hematemesis  8/11, repeat esophagogram showed persistently leakage from esophagus to right pleural space. 8/13, patient underwent right thoracoscopy, drainage of loculated pleural effusion, esophagus gastroscopy, esophageal stent placement and esophagogram.  Subjective: Patient was seen and examined this morning.  Awake, alert.  Breathing shallow.  Getting nebulizers.  Remains tachycardic.  Never a complainer. Has right-sided chest tube in place.  Currently on 4 L oxygen by nasal cannula. No fever in last 24 hours.  Heart rate between 100 and 110 bpm.  Blood pressure between 140s and 150s.   Labs from this morning noted rising WBC to 20.3, phosphorus level low at 2.2.  Assessment/Plan: Esophageal rupture leading to right-sided hydropneumothorax Right-sided complex loculated parapneumonic effusion Acute respiratory failure with hypoxia -Esophageal rupture diagnosed on 8/4.  -See timeline of events as above. -Currently has a chest tube on the right side. -Cardiothoracic surgery following.  -Esophagram this morning 8/16 does not show any esophageal leak. -Chest x-ray this morning 8/16 shows improved aeration of right lung in last 24 hours with decreased right pleural effusion -Currently on 4 L by nasal cannula.  Wean down as tolerated.   -Continue Mucomyst, flutter valve, and to breathing exercises. -Continue tube feeding at 55 mL/h.  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   -Currently on normal saline at 75 mill per hour. I will stop IV fluid as he is also getting PEG tube feeding. -Continue to trend WBC and temperature  trend. -Continue to monitor.  A. fib with  RVR -It is unclear if patient had A. fib in the past but he was on oral Cardizem at home. -Because of mediastinitis, patient went into A. fib with RVR.   -Currently on metoprolol 25 mg twice daily through PEG tube.  Rhythm converted to sinus but still remains tachycardic.  Cardiology following. -Heart rate 100 to 110 overnight.  Keep IV metoprolol 5 mg every 4 hours as needed.  -Patient will eventually required DOAC because of elevated risk of stroke.  Acute blood loss anemia  -He has longstanding history of reflux esophagitis, esophageal stricture and underwent recently stenting. -Hemoglobin level more than 11 at baseline.  It has been low in this hospitalization secondary to esophageal rupture and surgery.  -8/11 hemoglobin was at the lowest at 6.3. two units of PRBC transfused.   -Hemoglobin 10.5 today.  Repeat tomorrow. Recent Labs    05/19/20 0129 05/20/20 0524 05/21/20 0832 05/22/20 0418 05/23/20 0436 05/23/20 1930 05/24/20 0342 05/26/20 0331 05/27/20 0332 05/28/20 0107  HGB 7.1* 7.0* 7.8* 7.8* 6.3* 9.0* 9.2* 8.5* 9.4* 10.5*   Elevated liver enzymes -Elevated AST/ALT and alk phos today.  Last check on 7/30 was normal.   -Unclear etiology.  Continue to monitor. -Last CT abdomen pelvis was on 05/13/2020 which did not show any hepatobiliary or pancreatic problem. Recent Labs  Lab 05/27/20 0332 05/28/20 0107  AST 209* 65*  ALT 313* 247*  ALKPHOS 238* 252*  BILITOT 0.6 0.3  PROT 6.5 6.7  ALBUMIN 1.8* 1.8*   Acute delirium -Patient has had episodes of confusion likely secondary to sepsis, prolonged hospitalization and previous stroke. -Currently slow to respond, oriented to place but not restless or agitated.  Continue reorientation efforts. -As needed IV Haldol.  Left hydronephrosis and needs ESWL -Noted in CT scan obtained while in the hospital.  Seen by urology. Lithotripsy was scheduled on 8/10 but patient is to remain in  the hospital. -Patient will follow up with Urology on discharge.  Prior history of stroke -Prior to admission, patient was on aspirin 325 mg daily and Lipitor 40 mg daily. -Aspirin is currently on hold.  Will resume Lipitor post discharge.  Mobility: PT eval obtained.  CIR recommended. Code Status:   Code Status: Full Code  Nutritional status: Hypoalbuminemia - Albumin low at 1.8 Moderate malnutrition Body mass index is 19.47 kg/m. Nutrition Problem: Moderate Malnutrition Etiology: chronic illness (Barrett's esophagus, GERD) Signs/Symptoms: moderate fat depletion, moderate muscle depletion Diet Order            Diet NPO time specified  Diet effective now                 DVT prophylaxis: SCD's Start: 05/25/20 1355 Place and maintain sequential compression device Start: 05/23/20 1040 SCDs Start: 05/11/20 0134   Antimicrobials:  IV Zosyn, fluconazole IV, vancomycin IV Fluid: Not on IV fluid  Consultants: CT surgery, cardiology Family Communication:  None at bedside  Incision (Closed) 05/17/20 Chest Right (Active)  Date First Assessed/Time First Assessed: 05/17/20 1821   Location: Chest  Location Orientation: Right    Assessments 05/17/2020  6:55 PM 05/28/2020  8:00 AM  Dressing Type Pressure dressing Gauze (Comment)  Dressing Clean;Dry;Intact Clean;Dry;Intact  Dressing Change Frequency -- PRN  Site / Wound Assessment Dressing in place / Unable to assess Dressing in place / Unable to assess  Drainage Amount None None     No Linked orders to display     Pressure Injury 05/17/20 Buttocks Right Stage 2 -  Partial thickness loss  of dermis presenting as a shallow open injury with a red, pink wound bed without slough. (Active)  Date First Assessed/Time First Assessed: 05/17/20 0800   Location: Buttocks  Location Orientation: Right  Staging: Stage 2 -  Partial thickness loss of dermis presenting as a shallow open injury with a red, pink wound bed without slough.    Assessments  05/18/2020  8:00 AM 05/28/2020  8:00 AM  Dressing Type Foam - Lift dressing to assess site every shift Foam - Lift dressing to assess site every shift  Dressing Clean;Dry;Intact Clean;Dry;Intact     No Linked orders to display     Wound / Incision (Open or Dehisced) 05/19/20 Skin tear Buttocks Left (Active)  Date First Assessed/Time First Assessed: 05/19/20 1707   Wound Type: Skin tear  Location: Buttocks  Location Orientation: Left    Assessments 05/20/2020  8:30 AM 05/28/2020  8:00 AM  Dressing Type Foam - Lift dressing to assess site every shift None  Dressing Status Clean;Dry;Intact Clean;Dry;Intact  Site / Wound Assessment -- Clean;Dry  Peri-wound Assessment Intact Intact     No Linked orders to display     Incision (Closed) 05/25/20 Chest Right (Active)  Date First Assessed/Time First Assessed: 05/25/20 0950   Location: Chest  Location Orientation: Right    Assessments 05/25/2020 10:45 AM 05/28/2020  8:00 AM  Dressing Type Gauze (Comment);Tape dressing Gauze (Comment)  Dressing Clean;Intact;Dry Clean;Dry;Intact  Dressing Change Frequency -- PRN  Site / Wound Assessment Dressing in place / Unable to assess Dressing in place / Unable to assess  Drainage Amount None --     No Linked orders to display    Status is: Inpatient  Remains inpatient appropriate because:Ongoing diagnostic testing needed not appropriate for outpatient work up and IV treatments appropriate due to intensity of illness or inability to take PO   Dispo: The patient is from: Home              Anticipated d/c is to: SNF  Vs CIR              Anticipated d/c date is: More than 3 days              Patient currently is not medically stable to d/c.   Infusions:  . sodium chloride 70 mL/hr at 05/16/20 2122  . sodium chloride    . sodium chloride    . feeding supplement (OSMOLITE 1.5 CAL) 1,000 mL (05/28/20 0403)  . fluconazole (DIFLUCAN) IV 400 mg (05/28/20 1043)  . piperacillin-tazobactam (ZOSYN)  IV 3.375 g  (05/28/20 0745)  . vancomycin 750 mg (05/28/20 0934)    Scheduled Meds: . acetylcysteine  2 mL Nebulization TID  . bisacodyl  10 mg Oral Daily  . feeding supplement (PROSource TF)  45 mL Per Tube BID  . insulin aspart  0-24 Units Subcutaneous TID AC & HS  . mouth rinse  15 mL Mouth Rinse q12n4p  . metoprolol tartrate  25 mg Per Tube BID  . pantoprazole (PROTONIX) IV  40 mg Intravenous Q12H  . sennosides  5 mL Per Tube QHS    Antimicrobials: Anti-infectives (From admission, onward)   Start     Dose/Rate Route Frequency Ordered Stop   05/24/20 2100  vancomycin (VANCOREADY) IVPB 750 mg/150 mL     Discontinue     750 mg 150 mL/hr over 60 Minutes Intravenous Every 12 hours 05/24/20 0813     05/24/20 0900  vancomycin (VANCOREADY) IVPB 1500 mg/300 mL  1,500 mg 150 mL/hr over 120 Minutes Intravenous  Once 05/24/20 0813 05/24/20 1221   05/17/20 0900  fluconazole (DIFLUCAN) IVPB 400 mg     Discontinue     400 mg 100 mL/hr over 120 Minutes Intravenous Every 24 hours 05/17/20 0749     05/17/20 0600  vancomycin (VANCOREADY) IVPB 750 mg/150 mL  Status:  Discontinued        750 mg 150 mL/hr over 60 Minutes Intravenous Every 8 hours 05/16/20 2154 05/17/20 0903   05/17/20 0400  vancomycin (VANCOREADY) IVPB 750 mg/150 mL  Status:  Discontinued        750 mg 150 mL/hr over 60 Minutes Intravenous Every 8 hours 05/16/20 1945 05/16/20 2154   05/16/20 2200  vancomycin (VANCOREADY) IVPB 1250 mg/250 mL        1,250 mg 166.7 mL/hr over 90 Minutes Intravenous STAT 05/16/20 2153 05/16/20 2348   05/16/20 2000  vancomycin (VANCOREADY) IVPB 1250 mg/250 mL  Status:  Discontinued        1,250 mg 166.7 mL/hr over 90 Minutes Intravenous  Once 05/16/20 1945 05/16/20 2153   05/15/20 1600  piperacillin-tazobactam (ZOSYN) IVPB 3.375 g     Discontinue     3.375 g 12.5 mL/hr over 240 Minutes Intravenous Every 8 hours 05/15/20 0945     05/15/20 0930  piperacillin-tazobactam (ZOSYN) IVPB 3.375 g        3.375  g 100 mL/hr over 30 Minutes Intravenous  Once 05/15/20 0921 05/15/20 1009   05/13/20 2000  azithromycin (ZITHROMAX) tablet 500 mg        500 mg Oral Daily 05/13/20 1155 05/14/20 2127   05/10/20 2200  cefTRIAXone (ROCEPHIN) 2 g in sodium chloride 0.9 % 100 mL IVPB  Status:  Discontinued        2 g 200 mL/hr over 30 Minutes Intravenous Every 24 hours 05/10/20 2148 05/15/20 0911   05/10/20 2200  azithromycin (ZITHROMAX) 500 mg in sodium chloride 0.9 % 250 mL IVPB  Status:  Discontinued        500 mg 250 mL/hr over 60 Minutes Intravenous Every 24 hours 05/10/20 2148 05/13/20 1155      PRN meds: sodium chloride, Place/Maintain arterial line **AND** sodium chloride, Place/Maintain arterial line **AND** sodium chloride, [DISCONTINUED] acetaminophen **OR** acetaminophen, fentaNYL (SUBLIMAZE) injection, hydrALAZINE, ipratropium-albuterol, metoprolol tartrate, ondansetron (ZOFRAN) IV   Objective: Vitals:   05/28/20 0918 05/28/20 1000  BP: (!) 145/88 (!) 141/86  Pulse: (!) 116 (!) 113  Resp: (!) 27 (!) 30  Temp: 98.7 F (37.1 C)   SpO2: 96% 95%    Intake/Output Summary (Last 24 hours) at 05/28/2020 1110 Last data filed at 05/28/2020 0800 Gross per 24 hour  Intake 1048.33 ml  Output 820 ml  Net 228.33 ml   Filed Weights   05/26/20 2300 05/27/20 0500 05/28/20 0400  Weight: 63.7 kg 63.7 kg 59.8 kg   Weight change: -3.9 kg Body mass index is 19.47 kg/m.   Physical Exam: General exam: Appears comfortable.  Not in physical distress Skin: No rashes, lesions or ulcers. HEENT: Atraumatic, normocephalic, supple neck, no obvious bleeding Lungs: Shallow fast breathing.  Clear to auscultation of the left.  Chest tube on the right  CVS: Regular tachycardia, no murmur  GI/Abd soft, nontender, nondistended, bowel sound present. PEG tube site intact. CNS: Alert, awake, oriented to place and person only. Slow to respond. Not restless or agitated. Psychiatry: Depressed look Extremities: No pedal  edema, no calf tenderness  Data Review: I have personally  reviewed the laboratory data and studies available.  Recent Labs  Lab 05/22/20 0418 05/22/20 0418 05/23/20 0436 05/23/20 0436 05/23/20 1930 05/24/20 0342 05/26/20 0331 05/27/20 0332 05/28/20 0107  WBC 21.1*   < > 21.6*   < > 24.1* 25.7* 19.5* 13.5* 20.3*  NEUTROABS 18.8*  --  19.0*  --   --  22.7*  --   --  17.6*  HGB 7.8*   < > 6.3*   < > 9.0* 9.2* 8.5* 9.4* 10.5*  HCT 25.4*   < > 20.9*   < > 27.6* 28.3* 26.8* 29.7* 32.7*  MCV 87.3   < > 88.2   < > 85.4 85.8 85.4 87.9 86.3  PLT 571*   < > 582*   < > 506* 488* 549* 573* 545*   < > = values in this interval not displayed.   Recent Labs  Lab 05/23/20 0436 05/23/20 0844 05/24/20 0342 05/26/20 0331 05/27/20 0332 05/28/20 0107  NA 138  --  135 135 135 136  K 4.0  --  4.1 4.3 4.0 4.3  CL 104  --  105 103 105 105  CO2 24  --  20* 22 20* 21*  GLUCOSE 153*  --  170* 115* 160* 141*  BUN 16  --  '11 11 12 10  ' CREATININE 0.78  --  0.85 0.84 0.79 0.82  CALCIUM 8.6*  --  8.1* 7.9* 8.2* 8.0*  MG  --  1.8 2.1  --   --  2.1  PHOS  --   --   --   --   --  2.2*   Lab Results  Component Value Date   HGBA1C 5.5 07/12/2018       Component Value Date/Time   CHOL 124 07/13/2018 0524   TRIG 82 07/13/2018 0524   HDL 31 (L) 07/13/2018 0524   CHOLHDL 4.0 07/13/2018 0524   VLDL 16 07/13/2018 0524   LDLCALC 77 07/13/2018 0524    Signed, Terrilee Croak, MD [facility] Pager: [phone] (Secure Chat preferred). 05/28/2020

## 2020-05-28 NOTE — Progress Notes (Addendum)
      [address]       [place] [address]             [phone]      3 Days Post-Op Procedure(s) (LRB): ESOPHAGOGASTRODUODENOSCOPY (EGD); ESOPHAGEAL STENT REMOVAL. (N/A) ESOPHAGEAL STENT PLACEMENT USING A 23 MM COVERED STENT (N/A) VIDEO ASSISTED THORACOSCOPY (VATS)/EMPYEMA (Right) Subjective: No trouble breathing this morning per the patient. His pain is well controlled.   Objective: Vital signs in last 24 hours: Temp:  [98.2 F (36.8 C)-98.6 F (37 C)] 98.6 F (37 C) (08/16 0400) Pulse Rate:  [92-136] 102 (08/15 2229) Cardiac Rhythm: Sinus tachycardia (08/16 0707) Resp:  [20-35] 20 (08/15 2229) BP: (132-155)/(84-96) 155/96 (08/15 1957) SpO2:  [91 %-97 %] 91 % (08/15 2229) FiO2 (%):  [21 %-40 %] 21 % (08/15 1957) Weight:  [59.8 kg] 59.8 kg (08/16 0400)     Intake/Output from previous day: 08/15 0701 - 08/16 0700 In: 1058.3 [I.V.:10; NG/GT:440; IV Piggyback:608.3] Out: 620 [Urine:600; Drains:10; Chest Tube:10] Intake/Output this shift: No intake/output data recorded.  General appearance: alert, cooperative and no distress Heart: sinus tachycardia, no murmur Lungs: clear to auscultation on the left and no breath sound on the right Abdomen: soft, non-tender; bowel sounds normal; no masses,  no organomegaly Extremities: extremities normal, atraumatic, no cyanosis or edema Wound: clean and dry  Lab Results: Recent Labs    05/27/20 0332 05/28/20 0107  WBC 13.5* 20.3*  HGB 9.4* 10.5*  HCT 29.7* 32.7*  PLT 573* 545*   BMET:  Recent Labs    05/27/20 0332 05/28/20 0107  NA 135 136  K 4.0 4.3  CL 105 105  CO2 20* 21*  GLUCOSE 160* 141*  BUN 12 10  CREATININE 0.79 0.82  CALCIUM 8.2* 8.0*    PT/INR: No results for input(s): LABPROT, INR in the last 72 hours. ABG    Component Value Date/Time   PHART 7.443 05/26/2020 0335   HCO3 23.5 05/26/2020 0335   TCO2 27 05/17/2020 2036   ACIDBASEDEF 0.1 05/26/2020 0335   O2SAT 96.7 05/26/2020  0335   CBG (last 3)  Recent Labs    05/27/20 1957 05/27/20 2354 05/28/20 0441  GLUCAP 170* 135* 141*    Assessment/Plan: S/P Procedure(s) (LRB): ESOPHAGOGASTRODUODENOSCOPY (EGD); ESOPHAGEAL STENT REMOVAL. (N/A) ESOPHAGEAL STENT PLACEMENT USING A 23 MM COVERED STENT (N/A) VIDEO ASSISTED THORACOSCOPY (VATS)/EMPYEMA (Right)  1. CV-sinus tachycardia. BP well controlled. Continue IV hydralazine and IV metoprolol for control. Cardiology following along.  2. Pulm-Complete opacification of the right chest on CXR. Continue chest PT and mucomyst. Continue incentive spirometer.  3. Renal-creatinine 0.82, electrolytes okay 4. H and H 10.5/32.7 5. Endo-blood glucose well controlled. TSH in normal range. 6. GI- continue TF @ 70mL/hr. Swallow study planned for today.   Plan: Swallow study planned for this morning. Continue medical treatment. Continue pulmonary toilet for respiratory function. Encouraged incentive spirometer.    LOS: 17 days    Elgie Collard 05/28/2020  Agree with above. Swallow study shows no leak Dispo planning.  Laurin Paulo Bary Leriche

## 2020-05-28 NOTE — Progress Notes (Signed)
PT Cancellation Note  Patient Details Name: Evan Chavez MRN: [id number] DOB: 06/29/1961   Cancelled Treatment:    Reason Eval/Treat Not Completed: Other (comment)  Spoke with RN who reports ok for limited therapy session due to Red MEWS score.  However, pt declined bed exercises at this time.  Stated "not right now."   Returned later and pt with nursing staff for ADLs.  Will f/u as able.   Abran Richard, PT Acute Rehab Services Pager [phone] [facility] [place] 05/28/2020, 5:02 PM

## 2020-05-28 NOTE — Progress Notes (Signed)
Progress Note  Patient Name: Evan Chavez Date of Encounter: 05/28/2020  Primary Cardiologist:   No primary care provider on file.   Subjective   The patient is not in acute distress.  Inpatient Medications    Scheduled Meds: . acetylcysteine  2 mL Nebulization TID  . bisacodyl  10 mg Oral Daily  . feeding supplement (PROSource TF)  45 mL Per Tube BID  . insulin aspart  0-24 Units Subcutaneous TID AC & HS  . mouth rinse  15 mL Mouth Rinse q12n4p  . metoprolol tartrate  25 mg Per Tube BID  . pantoprazole (PROTONIX) IV  40 mg Intravenous Q12H  . sennosides  5 mL Per Tube QHS   Continuous Infusions: . sodium chloride 70 mL/hr at 05/16/20 2122  . sodium chloride    . sodium chloride    . feeding supplement (OSMOLITE 1.5 CAL) 1,000 mL (05/28/20 0403)  . fluconazole (DIFLUCAN) IV 400 mg (05/28/20 1043)  . piperacillin-tazobactam (ZOSYN)  IV 3.375 g (05/28/20 0745)  . vancomycin 750 mg (05/28/20 0934)   PRN Meds: sodium chloride, Place/Maintain arterial line **AND** sodium chloride, Place/Maintain arterial line **AND** sodium chloride, [DISCONTINUED] acetaminophen **OR** acetaminophen, fentaNYL (SUBLIMAZE) injection, hydrALAZINE, ipratropium-albuterol, metoprolol tartrate, ondansetron (ZOFRAN) IV   Vital Signs    Vitals:   05/28/20 0400 05/28/20 0735 05/28/20 0918 05/28/20 1000  BP:  136/89 (!) 145/88 (!) 141/86  Pulse:  (!) 109 (!) 116 (!) 113  Resp:  20 (!) 27 (!) 30  Temp: 98.6 F (37 C) 98.7 F (37.1 C) 98.7 F (37.1 C)   TempSrc: Oral Oral Oral   SpO2:  95% 96% 95%  Weight: 59.8 kg     Height:        Intake/Output Summary (Last 24 hours) at 05/28/2020 1115 Last data filed at 05/28/2020 0800 Gross per 24 hour  Intake 1048.33 ml  Output 820 ml  Net 228.33 ml   Filed Weights   05/26/20 2300 05/27/20 0500 05/28/20 0400  Weight: 63.7 kg 63.7 kg 59.8 kg    Telemetry    NSR, sinus tach - Personally Reviewed  ECG    NA - Personally  Reviewed  Physical Exam   GEN: No acute distress.   Neck: No JVD Cardiac: RRR,  murmurs, rubs, or gallops.  Respiratory:    Decreased breath sounds right greater than left leg. GI: Soft, nontender, non-distended, normal bowel sounds  MS:  No edema; No deformity. Neuro:   Nonfocal  Psych: Oriented and appropriate    Labs    Chemistry Recent Labs  Lab 05/26/20 0331 05/27/20 0332 05/28/20 0107  NA 135 135 136  K 4.3 4.0 4.3  CL 103 105 105  CO2 22 20* 21*  GLUCOSE 115* 160* 141*  BUN 11 12 10   CREATININE 0.84 0.79 0.82  CALCIUM 7.9* 8.2* 8.0*  PROT  --  6.5 6.7  ALBUMIN  --  1.8* 1.8*  AST  --  209* 65*  ALT  --  313* 247*  ALKPHOS  --  238* 252*  BILITOT  --  0.6 0.3  GFRNONAA >60 >60 >60  GFRAA >60 >60 >60  ANIONGAP 10 10 10      Hematology Recent Labs  Lab 05/26/20 0331 05/27/20 0332 05/28/20 0107  WBC 19.5* 13.5* 20.3*  RBC 3.14* 3.38* 3.79*  HGB 8.5* 9.4* 10.5*  HCT 26.8* 29.7* 32.7*  MCV 85.4 87.9 86.3  MCH 27.1 27.8 27.7  MCHC 31.7 31.6 32.1  RDW 15.2 14.9  15.0  PLT 549* 573* 545*    Cardiac EnzymesNo results for input(s): TROPONINI in the last 168 hours. No results for input(s): TROPIPOC in the last 168 hours.   BNPNo results for input(s): BNP, PROBNP in the last 168 hours.   DDimer No results for input(s): DDIMER in the last 168 hours.   Radiology    DG Chest Port 1 View  Result Date: 05/28/2020 CLINICAL DATA:  Pleural effusion, chest tube EXAM: PORTABLE CHEST 1 VIEW COMPARISON:  05/27/2020 FINDINGS: There are two right-sided chest tubes. Esophageal stent is again noted. Improved aeration of the right lung with persistent but decreased right pleural effusion. No pneumothorax. Cardiomediastinal contours are partially obscured IMPRESSION: Improved aeration of right lung since 05/27/2020 with persistent but decreased right pleural effusion. Electronically Signed   By: Macy Mis M.D.   On: 05/28/2020 09:53   DG Chest Port 1 View  Result  Date: 05/27/2020 CLINICAL DATA:  Shortness of breath EXAM: PORTABLE CHEST 1 VIEW COMPARISON:  05/26/2020 FINDINGS: Cardiac shadow is stable. Esophageal stent is again noted and stable. Two right-sided chest tubes are seen in satisfactory position. The overall appearance is stable. Persistent opacification of the right hemithorax is again noted similar to that seen on recent plain film evaluation. No new focal abnormality is noted. IMPRESSION: Stable appearance of the chest with consolidation in the right hemithorax. Electronically Signed   By: Inez Catalina M.D.   On: 05/27/2020 07:22   DG ESOPHAGUS W SINGLE CM (SOL OR THIN BA)  Result Date: 05/28/2020 CLINICAL DATA:  Esophageal stent with prior leak, reassessment for leak EXAM: ESOPHOGRAM/BARIUM SWALLOW TECHNIQUE: Single contrast examination was performed using 25 cc Omnipaque 300. FLUOROSCOPY TIME:  Fluoroscopy Time:  3 minutes, 24 seconds Radiation Exposure Index (if provided by the fluoroscopic device): 33 mGy Number of Acquired Spot Images: 0 COMPARISON:  Multiple exams, including 05/23/2020 FINDINGS: Mid to distal expandable esophageal stent noted. The prior right-sided leak from the distal margin of the stent is no longer present. Contrast medium extending around the distal margin of the stent primarily on the left side appears to be intraluminal and likely in between the stent and the wall of the distal esophagus or small hiatal hernia based on the contained appearance for example on image 26/8. This collection was noted to extend proximally along the distal half of the stent along the left side. This is not unexpected given the appearance on the chest CT of 05/24/2020, which showed some luminal gas tracking along the left side of the stent in this vicinity. Accordingly, no leak from the esophagus is currently identified. IMPRESSION: 1. No esophageal leak is currently identified. 2. A small amount of intraluminal contrast extends around the stent  distally, but this is felt to be due to incomplete apposition of the stent with the lumen, rather than a leak. Electronically Signed   By: Van Clines M.D.   On: 05/28/2020 08:29    Cardiac Studies   ECHO  1. Left ventricular ejection fraction, by estimation, is 65 to 70%. The  left ventricle has normal function. The left ventricle has no regional  wall motion abnormalities. Left ventricular diastolic parameters are  consistent with Grade I diastolic  dysfunction (impaired relaxation).  2. Right ventricular systolic function is normal. The right ventricular  size is normal. Tricuspid regurgitation signal is inadequate for assessing  PA pressure.  3. The mitral valve is grossly normal. Trivial mitral valve  regurgitation.  4. The aortic valve is tricuspid. Aortic  valve regurgitation is not  visualized.  5. The inferior vena cava is normal in size with greater than 50%  respiratory variability, suggesting right atrial pressure of 3 mmHg.    Patient Profile     59 y.o. male with a hx of prior stroke, HLD, GERD, Barrett's esophagus, PUD with gastric erosion, esophageal structure, BPH, history of alcohol/drug/tobacco abuse who is being seen for the evaluation of new onset afib at the request of Dr. Pietro Cassis.  Assessment & Plan    PAF:    No further atrial fib.  Stopped IV Dilt on 814.  On metoprolol 25 p.o. twice daily via feeding tube, I would increase to 50 mg p.o. twice daily.  Remains in sinus tach, slightly hypertensive.   He will need DOAC when able.      For questions or updates, please contact [place] Please consult [email or web address] for contact info under Cardiology/STEMI.   Signed, Ena Dawley, MD  05/28/2020, 11:15 AM

## 2020-05-29 ENCOUNTER — Inpatient Hospital Stay (HOSPITAL_COMMUNITY): Payer: Medicaid Other

## 2020-05-29 LAB — GLUCOSE, CAPILLARY
Glucose-Capillary: 115 mg/dL — ABNORMAL HIGH (ref 70–99)
Glucose-Capillary: 118 mg/dL — ABNORMAL HIGH (ref 70–99)
Glucose-Capillary: 141 mg/dL — ABNORMAL HIGH (ref 70–99)
Glucose-Capillary: 143 mg/dL — ABNORMAL HIGH (ref 70–99)
Glucose-Capillary: 143 mg/dL — ABNORMAL HIGH (ref 70–99)
Glucose-Capillary: 161 mg/dL — ABNORMAL HIGH (ref 70–99)

## 2020-05-29 LAB — CBC WITH DIFFERENTIAL/PLATELET
Abs Immature Granulocytes: 0.15 10*3/uL — ABNORMAL HIGH (ref 0.00–0.07)
Basophils Absolute: 0.1 10*3/uL (ref 0.0–0.1)
Basophils Relative: 0 %
Eosinophils Absolute: 0.2 10*3/uL (ref 0.0–0.5)
Eosinophils Relative: 1 %
HCT: 27.8 % — ABNORMAL LOW (ref 39.0–52.0)
Hemoglobin: 8.6 g/dL — ABNORMAL LOW (ref 13.0–17.0)
Immature Granulocytes: 1 %
Lymphocytes Relative: 5 %
Lymphs Abs: 1.2 10*3/uL (ref 0.7–4.0)
MCH: 26.9 pg (ref 26.0–34.0)
MCHC: 30.9 g/dL (ref 30.0–36.0)
MCV: 86.9 fL (ref 80.0–100.0)
Monocytes Absolute: 1.3 10*3/uL — ABNORMAL HIGH (ref 0.1–1.0)
Monocytes Relative: 6 %
Neutro Abs: 19.4 10*3/uL — ABNORMAL HIGH (ref 1.7–7.7)
Neutrophils Relative %: 87 %
Platelets: 653 10*3/uL — ABNORMAL HIGH (ref 150–400)
RBC: 3.2 MIL/uL — ABNORMAL LOW (ref 4.22–5.81)
RDW: 15 % (ref 11.5–15.5)
WBC: 22.2 10*3/uL — ABNORMAL HIGH (ref 4.0–10.5)
nRBC: 0 % (ref 0.0–0.2)

## 2020-05-29 LAB — COMPREHENSIVE METABOLIC PANEL
ALT: 157 U/L — ABNORMAL HIGH (ref 0–44)
AST: 37 U/L (ref 15–41)
Albumin: 1.8 g/dL — ABNORMAL LOW (ref 3.5–5.0)
Alkaline Phosphatase: 210 U/L — ABNORMAL HIGH (ref 38–126)
Anion gap: 10 (ref 5–15)
BUN: 12 mg/dL (ref 6–20)
CO2: 20 mmol/L — ABNORMAL LOW (ref 22–32)
Calcium: 8.1 mg/dL — ABNORMAL LOW (ref 8.9–10.3)
Chloride: 106 mmol/L (ref 98–111)
Creatinine, Ser: 0.76 mg/dL (ref 0.61–1.24)
GFR calc Af Amer: >60 mL/min (ref >60)
GFR calc non Af Amer: >60 mL/min (ref >60)
Glucose, Bld: 140 mg/dL — ABNORMAL HIGH (ref 70–99)
Potassium: 4.3 mmol/L (ref 3.5–5.1)
Sodium: 136 mmol/L (ref 135–145)
Total Bilirubin: 0.4 mg/dL (ref 0.3–1.2)
Total Protein: 6.4 g/dL — ABNORMAL LOW (ref 6.5–8.1)

## 2020-05-29 LAB — VANCOMYCIN, TROUGH: Vancomycin Tr: 11 ug/mL — ABNORMAL LOW (ref 15–20)

## 2020-05-29 MED ORDER — SODIUM PHOSPHATES 45 MMOLE/15ML IV SOLN
20.0000 mmol | Freq: Once | INTRAVENOUS | Status: AC
  Administered 2020-05-29: 20 mmol via INTRAVENOUS
  Filled 2020-05-29: qty 6.67

## 2020-05-29 MED ORDER — VANCOMYCIN HCL IN DEXTROSE 1-5 GM/200ML-% IV SOLN
1000.0000 mg | Freq: Two times a day (BID) | INTRAVENOUS | Status: DC
  Administered 2020-05-29 – 2020-05-30 (×2): 1000 mg via INTRAVENOUS
  Filled 2020-05-29 (×2): qty 200

## 2020-05-29 NOTE — Progress Notes (Signed)
Inpatient Rehabilitation Admissions Coordinator  Therapy recommending SNF at this time. We are not pursuing CIR admit.  Danne Baxter, RN, MSN Rehab Admissions Coordinator [phone] 05/29/2020 10:56 AM

## 2020-05-29 NOTE — Progress Notes (Signed)
PROGRESS NOTE  Evan Chavez  DOB: Jul 31, 1961  PCP: Rosita Fire, MD NOB:[id number]  DOA: 05/10/2020  LOS: 18 days   Chief Complaint  Patient presents with  . Shortness of Breath   Brief narrative: Evan Chavez is a 59 y.o. male with PMH of GERD, Barrett's esophagus, peptic ulcer disease with extensive gastric erosion, duodenal ulcer and esophageal stricture, also with history of prior stroke, hyperlipidemia, BPH, remote to cocaine abuse. He was hospitalized November 2019 with bibasal pneumonia and imaging showed esophageal stricture and impaired esophageal motility. EGD confirmed severe reflux esophagitis with peptic ulcer disease with extensive gastric erosions and duodenal ulcer.  Biopsy revealed Barrett's esophagus. He had a repeat EGD 12/2018 with balloon dilatation for peptic stricture, his dysphagia improved and surveillance EGD was advised.   He presented to the ED at [facility] on 7/28 with generalized weakness, productive cough and low-grade fever.   Chest x-ray showed bibasilar pneumonia, treated with amoxicillin and doxycycline.   He returned to the ED 7/29 due to increased shortness of breath.  Labs showed leukocytosis mild lactic acidosis.  CT angiogram of the chest showed bibasal consolidation and pleural effusions .  He was started on  ceftriaxone and azithromycin and admitted to hospital service.  8/2, patient had a successful right thoracentesis with 30 mL of cloudy yellow right pleural fluid drainage. Chest x-rays obtained between 8/2-8/4 showed worsening right-sided hydropneumothorax with possible lung collapse. 8/4, CT chest showed esophageal perforation arising from the right lateral aspect of the mid to distal esophagus and large right-sided hydropneumothorax.  It also showed findings consistent with aspiration pneumonia. 8/4 patient underwent right-sided chest tube placement.  He was transferred from [facility] to [facility] for CT surgery  evaluation. 8/5, patient underwent right video-assisted thoracoscopy, decortication, EGD, PEG tube placement, esophageal stent placement with a 25 X 125 mm stent by CT surgery. Patient was subsequently transferred hospitalist service.  8/10, patient had an episode of hematemesis  8/11, repeat esophagogram showed persistently leakage from esophagus to right pleural space. 8/13, patient underwent right thoracoscopy, drainage of loculated pleural effusion, esophagus gastroscopy, esophageal stent placement and esophagogram. 8/16, esophagram did not show any esophageal leak.  Currently, hospital service is following as a consult.  CT surgery is primary team.  Subjective: Patient was seen and examined this morning. Lying down in bed.  Not on supplemental oxygen today. Tachycardia improving, overnight to 90-110. Blood pressure in 130s. Labs from this morning with potassium normal at 4.3, LFTs improving, however WBC count is up to 22,000 and hemoglobin is down to 8.6. Phosphorus was low at 2.2 yesterday.  Assessment/Plan: Esophageal rupture leading to right-sided hydropneumothorax - s/p esophageal stent and PEG tube. Right-sided complex loculated parapneumonic effusion -Esophageal rupture diagnosed on 8/4. See timeline of events as above. -Currently has a chest tube on the right side. Cardiothoracic surgery following.  -Chest x-ray today 8/17 shows progressive dense right base atelectasis/infiltrate and persistent right midlung infiltrate  -Continue Mucomyst, flutter valve, and to breathing exercises. -Continue tube feeding at 55 mL/h.  Sepsis secondary to aspiration pneumonia and mediastinitis related to esophageal rupture -Clinically improving but WBC count still remains elevated. -Currently on broad-spectrum IV antibiotics per CT surgery. -Adequately hydrated.  Hemodynamically stable. -Continue to trend WBC and temperature trend. Recent Labs  Lab 05/24/20 0342 05/26/20 0331  05/27/20 0332 05/28/20 0107 05/29/20 0033  WBC 25.7* 19.5* 13.5* 20.3* 22.2*   Acute respiratory failure with hypoxia -Oxygen requirement improving.  On room air at rest  this morning.  May need supplemental oxygen during PT eval.  A. fib with RVR -It is unclear if patient had A. fib in the past but he was on oral Cardizem at home. -Because of mediastinitis, patient went into A. fib with RVR. -Currently on metoprolol 50 mg twice daily through PEG tube.  Rhythm converted to sinus but still remains tachycardic.  Cardiology following. -Heart rate 90 to 110 overnight.  Keep IV metoprolol 5 mg every 4 hours as needed.  -Patient will eventually required DOAC because of elevated risk of stroke.  Acute blood loss anemia  -He has longstanding history of reflux esophagitis, esophageal stricture and underwent recently stenting. -Hemoglobin level more than 11 at baseline.  It has been low in this hospitalization secondary to esophageal rupture and surgery.  -8/11 hemoglobin was at the lowest at 6.3. two units of PRBC transfused.   -No recurrence of hematemesis. -Trend as below.  Transfuse if less than 7 Recent Labs    05/20/20 0524 05/21/20 0832 05/22/20 0418 05/23/20 0436 05/23/20 1930 05/24/20 0342 05/26/20 0331 05/27/20 0332 05/28/20 0107 05/29/20 0033  HGB 7.0* 7.8* 7.8* 6.3* 9.0* 9.2* 8.5* 9.4* 10.5* 8.6*   Elevated liver enzymes -8/15, noted to have elevated AST/ALT and alk phos.  Last check before that on 7/30 was normal.   -Last CT abdomen pelvis was on 05/13/2020 which did not show any hepatobiliary or pancreatic problem. -Unclear etiology.  Gradually improving liver enzyme levels. Recent Labs  Lab 05/27/20 0332 05/28/20 0107 05/29/20 0033  AST 209* 65* 37  ALT 313* 247* 157*  ALKPHOS 238* 252* 210*  BILITOT 0.6 0.3 0.4  PROT 6.5 6.7 6.4*  ALBUMIN 1.8* 1.8* 1.8*   Hypophosphatemia -Phosphorus level low at 2.2 yesterday 8/16.  IV replacement ordered.  Repeat labs  tomorrow.  Impaired mobility Generalized weakness -Continue to work with physical therapy.  Acute delirium -Patient has had episodes of confusion likely secondary to sepsis, prolonged hospitalization and previous stroke. -Currently slow to respond, oriented to place but not restless or agitated. Continue reorientation efforts. -As needed IV Haldol.  Left hydronephrosis and needs ESWL -Noted in CT scan obtained while in the hospital.  Seen by urology. Lithotripsy was scheduled on 8/10 but patient is to remain in the hospital. -Patient will follow up with Urology on discharge.  Prior history of stroke -Prior to admission, patient was on aspirin 325 mg daily and Lipitor 40 mg daily. -Aspirin is currently on hold.  Will resume Lipitor post discharge.  Mobility: PT eval obtained.  CIR recommended. Code Status:   Code Status: Full Code  Nutritional status: Hypoalbuminemia - Albumin low at 1.8 Moderate malnutrition Body mass index is 19.5 kg/m. Nutrition Problem: Moderate Malnutrition Etiology: chronic illness (Barrett's esophagus, GERD) Signs/Symptoms: moderate fat depletion, moderate muscle depletion Diet Order            Diet NPO time specified  Diet effective now                 DVT prophylaxis: SCD's Start: 05/25/20 1355 Place and maintain sequential compression device Start: 05/23/20 1040 SCDs Start: 05/11/20 0134   Antimicrobials:  IV Zosyn, fluconazole IV, vancomycin IV per CT surgery. Fluid: Not on IV fluid  Consultants: CT surgery, cardiology Family Communication:  None at bedside  Incision (Closed) 05/17/20 Chest Right (Active)  Date First Assessed/Time First Assessed: 05/17/20 1821   Location: Chest  Location Orientation: Right    Assessments 05/17/2020  6:55 PM 05/28/2020  8:00 AM  Dressing  Type Pressure dressing Gauze (Comment)  Dressing Clean;Dry;Intact Clean;Dry;Intact  Dressing Change Frequency -- PRN  Site / Wound Assessment Dressing in place / Unable  to assess Dressing in place / Unable to assess  Drainage Amount None None     No Linked orders to display     Pressure Injury 05/17/20 Buttocks Right Stage 2 -  Partial thickness loss of dermis presenting as a shallow open injury with a red, pink wound bed without slough. (Active)  Date First Assessed/Time First Assessed: 05/17/20 0800   Location: Buttocks  Location Orientation: Right  Staging: Stage 2 -  Partial thickness loss of dermis presenting as a shallow open injury with a red, pink wound bed without slough.    Assessments 05/18/2020  8:00 AM 05/28/2020  8:00 PM  Dressing Type Foam - Lift dressing to assess site every shift Foam - Lift dressing to assess site every shift  Dressing Clean;Dry;Intact Clean;Dry;Intact     No Linked orders to display     Wound / Incision (Open or Dehisced) 05/19/20 Skin tear Buttocks Left (Active)  Date First Assessed/Time First Assessed: 05/19/20 1707   Wound Type: Skin tear  Location: Buttocks  Location Orientation: Left    Assessments 05/20/2020  8:30 AM 05/28/2020  8:00 PM  Dressing Type Foam - Lift dressing to assess site every shift None  Dressing Status Clean;Dry;Intact Clean;Dry;Intact  Site / Wound Assessment -- Clean;Dry  Peri-wound Assessment Intact Intact     No Linked orders to display     Incision (Closed) 05/25/20 Chest Right (Active)  Date First Assessed/Time First Assessed: 05/25/20 0950   Location: Chest  Location Orientation: Right    Assessments 05/25/2020 10:45 AM 05/28/2020  8:00 PM  Dressing Type Gauze (Comment);Tape dressing Gauze (Comment)  Dressing Clean;Intact;Dry Clean;Dry;Intact  Dressing Change Frequency -- PRN  Site / Wound Assessment Dressing in place / Unable to assess Dressing in place / Unable to assess  Drainage Amount None None     No Linked orders to display    Status is: Inpatient  Remains inpatient appropriate because:Ongoing diagnostic testing needed not appropriate for outpatient work up and IV treatments  appropriate due to intensity of illness or inability to take PO   Dispo: The patient is from: Home              Anticipated d/c is to: SNF  Vs CIR              Anticipated d/c date is: More than 3 days              Patient currently is not medically stable to d/c.   Infusions:  . sodium chloride 70 mL/hr at 05/16/20 2122  . sodium chloride    . sodium chloride    . feeding supplement (OSMOLITE 1.5 CAL) 1,000 mL (05/29/20 0256)  . fluconazole (DIFLUCAN) IV 400 mg (05/29/20 0943)  . piperacillin-tazobactam (ZOSYN)  IV 3.375 g (05/29/20 0836)  . sodium phosphate  Dextrose 5% IVPB    . vancomycin 750 mg (05/29/20 0835)    Scheduled Meds: . acetylcysteine  2 mL Nebulization TID  . bisacodyl  10 mg Oral Daily  . feeding supplement (PROSource TF)  45 mL Per Tube BID  . insulin aspart  0-24 Units Subcutaneous TID AC & HS  . mouth rinse  15 mL Mouth Rinse q12n4p  . metoprolol tartrate  50 mg Per Tube BID  . pantoprazole (PROTONIX) IV  40 mg Intravenous Q12H  . sennosides  5 mL Per Tube QHS    Antimicrobials: Anti-infectives (From admission, onward)   Start     Dose/Rate Route Frequency Ordered Stop   05/24/20 2100  vancomycin (VANCOREADY) IVPB 750 mg/150 mL     Discontinue     750 mg 150 mL/hr over 60 Minutes Intravenous Every 12 hours 05/24/20 0813     05/24/20 0900  vancomycin (VANCOREADY) IVPB 1500 mg/300 mL        1,500 mg 150 mL/hr over 120 Minutes Intravenous  Once 05/24/20 0813 05/24/20 1221   05/17/20 0900  fluconazole (DIFLUCAN) IVPB 400 mg     Discontinue     400 mg 100 mL/hr over 120 Minutes Intravenous Every 24 hours 05/17/20 0749     05/17/20 0600  vancomycin (VANCOREADY) IVPB 750 mg/150 mL  Status:  Discontinued        750 mg 150 mL/hr over 60 Minutes Intravenous Every 8 hours 05/16/20 2154 05/17/20 0903   05/17/20 0400  vancomycin (VANCOREADY) IVPB 750 mg/150 mL  Status:  Discontinued        750 mg 150 mL/hr over 60 Minutes Intravenous Every 8 hours 05/16/20 1945  05/16/20 2154   05/16/20 2200  vancomycin (VANCOREADY) IVPB 1250 mg/250 mL        1,250 mg 166.7 mL/hr over 90 Minutes Intravenous STAT 05/16/20 2153 05/16/20 2348   05/16/20 2000  vancomycin (VANCOREADY) IVPB 1250 mg/250 mL  Status:  Discontinued        1,250 mg 166.7 mL/hr over 90 Minutes Intravenous  Once 05/16/20 1945 05/16/20 2153   05/15/20 1600  piperacillin-tazobactam (ZOSYN) IVPB 3.375 g     Discontinue     3.375 g 12.5 mL/hr over 240 Minutes Intravenous Every 8 hours 05/15/20 0945     05/15/20 0930  piperacillin-tazobactam (ZOSYN) IVPB 3.375 g        3.375 g 100 mL/hr over 30 Minutes Intravenous  Once 05/15/20 0921 05/15/20 1009   05/13/20 2000  azithromycin (ZITHROMAX) tablet 500 mg        500 mg Oral Daily 05/13/20 1155 05/14/20 2127   05/10/20 2200  cefTRIAXone (ROCEPHIN) 2 g in sodium chloride 0.9 % 100 mL IVPB  Status:  Discontinued        2 g 200 mL/hr over 30 Minutes Intravenous Every 24 hours 05/10/20 2148 05/15/20 0911   05/10/20 2200  azithromycin (ZITHROMAX) 500 mg in sodium chloride 0.9 % 250 mL IVPB  Status:  Discontinued        500 mg 250 mL/hr over 60 Minutes Intravenous Every 24 hours 05/10/20 2148 05/13/20 1155      PRN meds: sodium chloride, Place/Maintain arterial line **AND** sodium chloride, Place/Maintain arterial line **AND** sodium chloride, [DISCONTINUED] acetaminophen **OR** acetaminophen, fentaNYL (SUBLIMAZE) injection, hydrALAZINE, ipratropium-albuterol, metoprolol tartrate, ondansetron (ZOFRAN) IV   Objective: Vitals:   05/29/20 0719 05/29/20 0842  BP: 136/86   Pulse: (!) 103 (!) 110  Resp: 20 (!) 28  Temp: 98 F (36.7 C)   SpO2: 94% 95%    Intake/Output Summary (Last 24 hours) at 05/29/2020 1023 Last data filed at 05/28/2020 1738 Gross per 24 hour  Intake 0 ml  Output 430 ml  Net -430 ml   Filed Weights   05/27/20 0500 05/28/20 0400 05/29/20 0245  Weight: 63.7 kg 59.8 kg 59.9 kg   Weight change: 0.1 kg Body mass index is 19.5  kg/m.   Physical Exam: General exam: Appears comfortable. Not in physical distress.  On room air today Skin: No  rashes, lesions or ulcers. HEENT: Atraumatic, normocephalic, supple neck, no obvious bleeding Lungs: Breathing remains shallow.  Clear to auscultation of the left.  Chest tube on the right.  Decreased air entry on the right CVS: Regular tachycardia, no murmur  GI/Abd soft, nontender, nondistended, bowel sound present. PEG tube site intact. CNS: Opens eyes on verbal command, oriented to place and person only. Slow to respond. Not restless or agitated. Psychiatry: Depressed look Extremities: No pedal edema, no calf tenderness  Data Review: I have personally reviewed the laboratory data and studies available.  Recent Labs  Lab 05/23/20 0436 05/23/20 1930 05/24/20 0342 05/26/20 0331 05/27/20 0332 05/28/20 0107 05/29/20 0033  WBC 21.6*   < > 25.7* 19.5* 13.5* 20.3* 22.2*  NEUTROABS 19.0*  --  22.7*  --   --  17.6* 19.4*  HGB 6.3*   < > 9.2* 8.5* 9.4* 10.5* 8.6*  HCT 20.9*   < > 28.3* 26.8* 29.7* 32.7* 27.8*  MCV 88.2   < > 85.8 85.4 87.9 86.3 86.9  PLT 582*   < > 488* 549* 573* 545* 653*   < > = values in this interval not displayed.   Recent Labs  Lab 05/23/20 0436 05/23/20 0844 05/24/20 0342 05/26/20 0331 05/27/20 0332 05/28/20 0107 05/29/20 0033  NA   < >  --  135 135 135 136 136  K   < >  --  4.1 4.3 4.0 4.3 4.3  CL   < >  --  105 103 105 105 106  CO2   < >  --  20* 22 20* 21* 20*  GLUCOSE   < >  --  170* 115* 160* 141* 140*  BUN   < >  --  '11 11 12 10 12  ' CREATININE   < >  --  0.85 0.84 0.79 0.82 0.76  CALCIUM   < >  --  8.1* 7.9* 8.2* 8.0* 8.1*  MG  --  1.8 2.1  --   --  2.1  --   PHOS  --   --   --   --   --  2.2*  --    < > = values in this interval not displayed.   Lab Results  Component Value Date   HGBA1C 5.5 07/12/2018       Component Value Date/Time   CHOL 124 07/13/2018 0524   TRIG 82 07/13/2018 0524   HDL 31 (L) 07/13/2018 0524    CHOLHDL 4.0 07/13/2018 0524   VLDL 16 07/13/2018 0524   LDLCALC 77 07/13/2018 0524    Signed, Terrilee Croak, MD [facility] Pager: [phone] (Secure Chat preferred). 05/29/2020

## 2020-05-29 NOTE — Progress Notes (Addendum)
      [address]       [place] [address]             [phone]      4 Days Post-Op Procedure(s) (LRB): ESOPHAGOGASTRODUODENOSCOPY (EGD); ESOPHAGEAL STENT REMOVAL. (N/A) ESOPHAGEAL STENT PLACEMENT USING A 23 MM COVERED STENT (N/A) VIDEO ASSISTED THORACOSCOPY (VATS)/EMPYEMA (Right) Subjective: Feels okay this morning. No complaints.  Objective: Vital signs in last 24 hours: Temp:  [98 F (36.7 C)-100.1 F (37.8 C)] 98 F (36.7 C) (08/17 0719) Pulse Rate:  [89-130] 103 (08/17 0719) Cardiac Rhythm: Sinus tachycardia (08/17 0709) Resp:  [20-44] 20 (08/17 0719) BP: (122-145)/(81-91) 136/86 (08/17 0719) SpO2:  [93 %-100 %] 94 % (08/17 0719) FiO2 (%):  [21 %] 21 % (08/16 1356) Weight:  [59.9 kg] 59.9 kg (08/17 0245)     Intake/Output from previous day: 08/16 0701 - 08/17 0700 In: 0  Out: 630 [Urine:600; Drains:20; Chest Tube:10] Intake/Output this shift: No intake/output data recorded.  General appearance: alert, cooperative and no distress Heart: regular rate and rhythm, S1, S2 normal, no murmur, click, rub or gallop Lungs: clear to auscultation on the left, right lower lobe diminished Abdomen: soft, non-tender; bowel sounds normal; no masses,  no organomegaly Extremities: extremities normal, atraumatic, no cyanosis or edema Wound: clean and dry  Lab Results: Recent Labs    05/28/20 0107 05/29/20 0033  WBC 20.3* 22.2*  HGB 10.5* 8.6*  HCT 32.7* 27.8*  PLT 545* 653*   BMET:  Recent Labs    05/28/20 0107 05/29/20 0033  NA 136 136  K 4.3 4.3  CL 105 106  CO2 21* 20*  GLUCOSE 141* 140*  BUN 10 12  CREATININE 0.82 0.76  CALCIUM 8.0* 8.1*    PT/INR: No results for input(s): LABPROT, INR in the last 72 hours. ABG    Component Value Date/Time   PHART 7.443 05/26/2020 0335   HCO3 23.5 05/26/2020 0335   TCO2 27 05/17/2020 2036   ACIDBASEDEF 0.1 05/26/2020 0335   O2SAT 96.7 05/26/2020 0335   CBG (last 3)  Recent Labs     05/28/20 2011 05/29/20 0017 05/29/20 0427  GLUCAP 128* 118* 143*    Assessment/Plan: S/P Procedure(s) (LRB): ESOPHAGOGASTRODUODENOSCOPY (EGD); ESOPHAGEAL STENT REMOVAL. (N/A) ESOPHAGEAL STENT PLACEMENT USING A 23 MM COVERED STENT (N/A) VIDEO ASSISTED THORACOSCOPY (VATS)/EMPYEMA (Right)  1. CV-NSR in the 90s to sinus tachycardia. BP well controlled. Continue IV hydralazine and IV metoprolol for control. Cardiology following along.  2. Pulm-CXR much improved today but still has a right pleural effusion. Continue chest PT and mucomyst. Continue incentive spirometer. Chest tube in place with only 10cc of drainage.  3. Renal-creatinine 0.76, electrolytes okay 4. H and H 8.6/27.8, which has decreased from yesterday.  5. Endo-blood glucose well controlled. TSH in normal range. 6. GI- continue TF @ 18mL/hr. Swallow study shows no leak. 7. ID- WBC 22.2, continue vanc and zosyn  Plan: TOC team working on a disposition plan. He will likely need a SNF based on his needs.    LOS: 18 days    Elgie Collard 05/29/2020  Agree with above. Dispel planning. Continue tube feeds We will remove [place] chest tube prior to discharge.  Valera Vallas Bary Leriche

## 2020-05-29 NOTE — TOC Initial Note (Signed)
Transition of Care [facility]) - Initial/Assessment Note    Patient Details  Name: Evan Chavez MRN: [id number] Date of Birth: 12/24/1960  [facility] of [facility]) CM/SW Contact:    Emeterio Reeve, [place] Phone Number: 05/29/2020, 3:37 PM  Clinical Narrative:                  CSW met with pt at bedside. CSW introduced self and explained her role at the hospital.  Pt stated PTA he was living at home with his mother. Pt stated he was independent with walking and all ADL's.   CSW reviewed pt reccs of SNF. Pt stated he is fine with SNF. PT reports he has not been in the past and is open to facilities and and around [place]. Pt only has [employer], which may be a barrier for SNF.   Passr received, pt has been faxed out to facilities in the [place] area.  Expected Discharge Plan: Skilled Nursing Facility Barriers to Discharge: Inadequate or no insurance, Ship broker, Continued Medical Work up   Patient Goals and CMS Choice Patient states their goals for this hospitalization and ongoing recovery are:: To get better CMS Medicare.gov Compare Post Acute Care list provided to:: Patient Choice offered to / list presented to : Patient  Expected Discharge Plan and Services Expected Discharge Plan: [place] arrangements for the past 2 months: Single Family Home                                      Prior Living Arrangements/Services Living arrangements for the past 2 months: Single Family Home Lives with:: Parents Patient language and need for interpreter reviewed:: Yes Do you feel safe going back to the place where you live?: Yes      Need for Family Participation in Patient Care: Yes (Comment) Care giver support system in place?: Yes (comment)   Criminal Activity/Legal Involvement Pertinent to Current Situation/Hospitalization: No - Comment as needed  Activities of Daily Living Home Assistive Devices/Equipment: Cane  (specify quad or straight) ADL Screening (condition at time of admission) Patient's cognitive ability adequate to safely complete daily activities?: Yes Is the patient deaf or have difficulty hearing?: No Does the patient have difficulty seeing, even when wearing glasses/contacts?: No Does the patient have difficulty concentrating, remembering, or making decisions?: No Patient able to express need for assistance with ADLs?: Yes Does the patient have difficulty dressing or bathing?: No Independently performs ADLs?: Yes (appropriate for developmental age) Does the patient have difficulty walking or climbing stairs?: Yes Weakness of Legs: Both Weakness of Arms/Hands: Both  Permission Sought/Granted Permission sought to share information with : Investment banker, corporate granted to share info w AGENCY: SNF        Emotional Assessment Appearance:: Appears stated age Attitude/Demeanor/Rapport: Engaged Affect (typically observed): Appropriate Orientation: : Oriented to Self, Oriented to Place, Oriented to  Time, Oriented to Situation Alcohol / Substance Use: Not Applicable Psych Involvement: No (comment)  Admission diagnosis:  Shortness of breath [R06.02] Respiratory distress [R06.03] Chronic bilateral pleural effusions [J90] Esophageal perforation [K22.3] Esophageal anastomotic leak [K91.89] Patient Active Problem List   Diagnosis Date Noted   Esophageal anastomotic leak 05/25/2020   Atrial fibrillation with RVR ([place])    Pressure injury of skin 05/20/2020   Malnutrition of moderate degree 05/17/2020   Esophageal perforation  05/17/2020   Chronic bilateral pleural effusions    Parapneumonic effusion    Pneumothorax on right 05/15/2020   Acute respiratory failure with hypoxia (HCC) 05/15/2020   Ureteral stone with hydronephrosis 05/13/2020   Shortness of breath 05/11/2020   Thyroid nodule 05/11/2020   BPH (benign prostatic hyperplasia)  05/11/2020   Loculated pleural effusion 05/11/2020   Barrett's esophagus 06/15/2019   Peptic stricture of esophagus 03/30/2019   Anemia 03/30/2019   Abnormality of esophagus    Gastritis and gastroduodenitis    Gastroesophageal reflux disease with esophagitis    Lobar pneumonia ([place]) 08/22/2018   Sepsis due to undetermined organism ([place]) 08/22/2018   Fever    Precordial pain    CAP (community acquired pneumonia) 08/21/2018   Elevated transaminase level 08/21/2018   Atrial tachycardia ([place]) 07/13/2018   Dyslipidemia 07/13/2018   Acute ischemic stroke ([place]) 07/12/2018   Essential hypertension 07/12/2018   Tobacco abuse 07/12/2018   Cocaine abuse ([place]) 07/12/2018   Ischemic stroke ([place])    PCP:  Rosita Fire, MD Pharmacy:   [id number] [place] [id number]: [phone]: [phone]    Social Determinants of Health (SDOH) Interventions    Readmission Risk Interventions No flowsheet data found.  MEmeterio Reeve LLatanya Presser LWatertownSocial Worker [phone]

## 2020-05-29 NOTE — [place] FL2 (Signed)
[place] MEDICAID FL2 LEVEL OF CARE SCREENING TOOL     IDENTIFICATION  Patient Name: Evan Chavez Birthdate: May 12, 1961 Sex: male Admission Date (Current Location): 05/10/2020  [facility] and [place] Number:  [employer] and Address:  [facility], [place] [address], [place], [place] [address]      Provider Number: [id number]  Attending Physician Name and Address:  Lajuana Matte, MD  Relative Name and Phone Number:       Current Level of Care: Hospital Recommended Level of Care: [place] Prior Approval Number:    Date Approved/Denied:   PASRR Number: [id number]  Discharge Plan: SNF    Current Diagnoses: Patient Active Problem List   Diagnosis Date Noted  . Esophageal anastomotic leak 05/25/2020  . Atrial fibrillation with RVR ([place])   . Pressure injury of skin 05/20/2020  . Malnutrition of moderate degree 05/17/2020  . Esophageal perforation 05/17/2020  . Chronic bilateral pleural effusions   . Parapneumonic effusion   . Pneumothorax on right 05/15/2020  . Acute respiratory failure with hypoxia ([place]) 05/15/2020  . Ureteral stone with hydronephrosis 05/13/2020  . Shortness of breath 05/11/2020  . Thyroid nodule 05/11/2020  . BPH (benign prostatic hyperplasia) 05/11/2020  . Loculated pleural effusion 05/11/2020  . Barrett's esophagus 06/15/2019  . Peptic stricture of esophagus 03/30/2019  . Anemia 03/30/2019  . Abnormality of esophagus   . Gastritis and gastroduodenitis   . Gastroesophageal reflux disease with esophagitis   . Lobar pneumonia ([place]) 08/22/2018  . Sepsis due to undetermined organism ([place]) 08/22/2018  . Fever   . Precordial pain   . CAP (community acquired pneumonia) 08/21/2018  . Elevated transaminase level 08/21/2018  . Atrial tachycardia ([place]) 07/13/2018  . Dyslipidemia 07/13/2018  . Acute ischemic stroke ([place]) 07/12/2018  . Essential hypertension 07/12/2018  . Tobacco abuse  07/12/2018  . Cocaine abuse ([place]) 07/12/2018  . Ischemic stroke ([place])     Orientation RESPIRATION BLADDER Height & Weight     Self, Time, Situation, Place  Normal Incontinent Weight: 132 lb 0.9 oz (59.9 kg) Height:  5\' 9"  (175.3 cm)  BEHAVIORAL SYMPTOMS/MOOD NEUROLOGICAL BOWEL NUTRITION STATUS      Incontinent Diet (See discharge summary)  AMBULATORY STATUS COMMUNICATION OF NEEDS Skin   Extensive Assist   PU Stage and Appropriate Care   PU Stage 2 Dressing: BID                   Personal Care Assistance Level of Assistance  Bathing, Feeding, Dressing Bathing Assistance: Maximum assistance Feeding assistance: Limited assistance Dressing Assistance: Maximum assistance     Functional Limitations Info  Speech, Sight, Hearing Sight Info: Adequate Hearing Info: Adequate Speech Info: Impaired    SPECIAL CARE FACTORS FREQUENCY  PT (By licensed PT), OT (By licensed OT)     PT Frequency: 5x a week OT Frequency: 5x a week            Contractures      Additional Factors Info  Code Status, Allergies Code Status Info: Full Allergies Info: NKA           Current Medications (05/29/2020):  This is the current hospital active medication list Current Facility-Administered Medications  Medication Dose Route Frequency Provider Last Rate Last Admin  . 0.9 %  sodium chloride infusion   Intravenous PRN Elgie Collard, PA-C 70 mL/hr at 05/16/20 2122 Rate Change at 05/16/20 2122  . 0.9 %  sodium chloride infusion  Intra-arterial PRN Harriet Pho, Tessa N, PA-C      . 0.9 %  sodium chloride infusion   Intra-arterial PRN Harriet Pho, Tessa N, PA-C      . acetaminophen (TYLENOL) suppository 650 mg  650 mg Rectal Q6H PRN Elgie Collard, PA-C   650 mg at 05/28/20 2016  . acetylcysteine (MUCOMYST) 20 % nebulizer / oral solution 2 mL  2 mL Nebulization TID Lajuana Matte, MD   2 mL at 05/29/20 1328  . bisacodyl (DULCOLAX) EC tablet 10 mg  10 mg Oral Daily Nicholes Rough N, PA-C   10 mg at  05/28/20 0940  . feeding supplement (OSMOLITE 1.5 CAL) liquid 1,000 mL  1,000 mL Per Tube Continuous Lajuana Matte, MD 55 mL/hr at 05/29/20 0256 1,000 mL at 05/29/20 0256  . feeding supplement (PROSource TF) liquid 45 mL  45 mL Per Tube BID Lajuana Matte, MD   45 mL at 05/29/20 0900  . fentaNYL (SUBLIMAZE) injection 25 mcg  25 mcg Intravenous Q2H PRN Elgie Collard, PA-C   25 mcg at 05/28/20 2121  . fluconazole (DIFLUCAN) IVPB 400 mg  400 mg Intravenous Q24H Elgie Collard, PA-C 100 mL/hr at 05/29/20 0943 400 mg at 05/29/20 0943  . hydrALAZINE (APRESOLINE) injection 10 mg  10 mg Intravenous Q4H PRN Conte, Tessa N, PA-C      . insulin aspart (novoLOG) injection 0-24 Units  0-24 Units Subcutaneous TID AC & HS Elgie Collard, PA-C   4 Units at 05/29/20 1229  . ipratropium-albuterol (DUONEB) 0.5-2.5 (3) MG/3ML nebulizer solution 3 mL  3 mL Nebulization Q4H PRN Harriet Pho, Tessa N, PA-C   3 mL at 05/29/20 1329  . MEDLINE mouth rinse  15 mL Mouth Rinse q12n4p Elgie Collard, PA-C   15 mL at 05/29/20 1229  . metoprolol tartrate (LOPRESSOR) injection 5 mg  5 mg Intravenous Q4H PRN Elgie Collard, PA-C   5 mg at 05/28/20 2016  . metoprolol tartrate (LOPRESSOR) tablet 50 mg  50 mg Per Tube BID Dorothy Spark, MD   50 mg at 05/29/20 0859  . ondansetron (ZOFRAN) injection 4 mg  4 mg Intravenous Q6H PRN Conte, Tessa N, PA-C      . pantoprazole (PROTONIX) injection 40 mg  40 mg Intravenous Q12H Conte, Tessa N, PA-C   40 mg at 05/29/20 0836  . piperacillin-tazobactam (ZOSYN) IVPB 3.375 g  3.375 g Intravenous Q8H Conte, Tessa N, PA-C 12.5 mL/hr at 05/29/20 0836 3.375 g at 05/29/20 0836  . sennosides (SENOKOT) 8.8 MG/5ML syrup 5 mL  5 mL Per Tube QHS Nicholes Rough N, PA-C   5 mL at 05/27/20 2144  . sodium phosphate 20 mmol in dextrose 5 % 250 mL infusion  20 mmol Intravenous Once Terrilee Croak, MD 43 mL/hr at 05/29/20 1231 20 mmol at 05/29/20 1231  . vancomycin (VANCOCIN) IVPB 1000 mg/200 mL premix  1,000  mg Intravenous Q12H Lightfoot, Lucile Crater, MD         Discharge Medications: Please see discharge summary for a list of discharge medications.  Relevant Imaging Results:  Relevant Lab Results:   Additional Information SSN [id number]  Emeterio Reeve, [place]

## 2020-05-29 NOTE — Plan of Care (Signed)
  Problem: Education: Goal: Knowledge of General Education information will improve Description: Including pain rating scale, medication(s)/side effects and non-pharmacologic comfort measures Outcome: Progressing   Problem: Clinical Measurements: Goal: Will remain free from infection Outcome: Progressing Goal: Diagnostic test results will improve Outcome: Progressing   

## 2020-05-29 NOTE — Progress Notes (Signed)
Pharmacy Antibiotic Note  Evan Chavez is a 59 y.o. male with PNA (s/p VATs) on zosyn (day 14) vancomycin (day5) and fluconazole (day12).  WBC improving 25>22 Tm improved 99.  -CrCl ~ 85 -VT =11 < goal 15-20   Plan: -increase Vancomycin 1000mg  q12h -Will follow renal function, cultures and clinical progress    Height: 5\' 9"  (175.3 cm) Weight: 59.9 kg (132 lb 0.9 oz) IBW/kg (Calculated) : 70.7  Temp (24hrs), Avg:99 F (37.2 C), Min:98 F (36.7 C), Max:100.1 F (37.8 C)  Recent Labs  Lab 05/24/20 0342 05/26/20 0331 05/27/20 0332 05/28/20 0107 05/29/20 0033 05/29/20 0839  WBC 25.7* 19.5* 13.5* 20.3* 22.2*  --   CREATININE 0.85 0.84 0.79 0.82 0.76  --   VANCOTROUGH  --   --   --   --   --  11*    Estimated Creatinine Clearance: 84.2 mL/min (by C-G formula based on SCr of 0.76 mg/dL).    No Known Allergies  Antimicrobials this admission: Vancomycin 8/12>> Zosyn 8/3 >> Vanc x1 8/5 Ceftriaxone 7/30>>8/2 Azithromycin 7/30>>8/2 Fluconazole 8/5>>   Microbiology results: 7/29 BCx - negative 7/30 UCx - negative 8/2 pleural fluid - negative 8/4 MRSA PCR - negative 8/5 right chest empyema tissue - candida parapsilosis 8/13 empyema fluid candida parapsilosis   Bonnita Nasuti Pharm.D. CPP, BCPS Clinical Pharmacist [phone] 05/29/2020 12:13 PM   t **Pharmacist phone directory can now be found on [email or web address] ([email or web address]).  Listed under [place].

## 2020-05-29 NOTE — Progress Notes (Signed)
Physical Therapy Treatment Patient Details Name: Evan Chavez MRN: [id number] DOB: 07/20/1961 Today's Date: 05/29/2020    History of Present Illness Pt is 59 yo male presents to the hospital on 7/28 with generalized weakness, productive cough and low-grade fever. Pt is currently being treated for hydropneumothorax, Sepsis aspiration pneumonia and esophagal rupture, AKI and acute delirium. Pt had successful R thoracentesis on 8/2. Pt had a chest tube put in on 8/2 and was removed 8/9. Pt also underwent a VATS, decortication, EGD, PEG tube placement, and a esophageal stent on 8/9. PMH of GERD, Barrett's esophagus, peptic ulcer disease with extensive gastric erosion, duodenal ulcer and esophageal stricture, also with history of prior stroke, hyperlipidemia, BPH, remote to cocaine abuse. Pt returned to OR on 8/13 for Right thoracoscopy, chest tube placement, esophageal stent placement.    PT Comments    Pt making good progress today but does fatigue easily and require frequent rest breaks.  All VSS on RA today.  Pt requiring increased time for transfers and cues for sequencing/technique.  PCont POC.  Still with limited activity tolerance - cont to recommend SNF at this time.     Follow Up Recommendations  SNF     Equipment Recommendations  Wheelchair (measurements PT);Wheelchair cushion (measurements PT);Hospital bed;3in1 (PT);Rolling walker with 5" wheels    Recommendations for Other Services       Precautions / Restrictions Precautions Precautions: Fall Precaution Comments: R chest tube and JP drain; PEG    Mobility  Bed Mobility Overal bed mobility: Needs Assistance Bed Mobility: Sit to Supine;Supine to Sit     Supine to sit: Mod assist Sit to supine: Mod assist   General bed mobility comments: increased time; cues for sequencing; assist for bil LE back to bed and assist to lift trunk out of bed  Transfers Overall transfer level: Needs assistance Equipment used: 1 person  hand held assist Transfers: Sit to/from Stand Sit to Stand: Mod assist         General transfer comment: cues for hand placement; mod A to power up  Ambulation/Gait Ambulation/Gait assistance: Mod assist Gait Distance (Feet): 2 Feet Assistive device: 1 person hand held assist Gait Pattern/deviations: Step-to pattern;Shuffle;Decreased stride length Gait velocity: decreased   General Gait Details: took a few side steps to [facility] with mod A to steady; would benefit from RW to advance; fatigued easily   Stairs             Wheelchair Mobility    Modified Rankin (Stroke Patients Only)       Balance Overall balance assessment: Needs assistance Sitting-balance support: Single extremity supported;Feet supported Sitting balance-Leahy Scale: Poor Sitting balance - Comments: Reliant on UE support for balance due to weakness   Standing balance support: Single extremity supported;During functional activity Standing balance-Leahy Scale: Poor Standing balance comment: single UE support with therapist; required mod A; fatigued easily only stood for ~15 sec                            Cognition Arousal/Alertness: Awake/alert Behavior During Therapy: Flat affect   Area of Impairment: Problem solving;Awareness;Following commands                       Following Commands: Follows one step commands consistently   Awareness: Emergent Problem Solving: Slow processing;Decreased initiation;Difficulty sequencing General Comments: pt with expressive difficulties at baseline, difficulty with word finding, able to select appropriate response when givien options;pt  stating one word at a time, following commands consistently;oriented x3      Exercises General Exercises - Lower Extremity Ankle Circles/Pumps: AROM;Both;10 reps;Seated Long Arc Quad: AROM;Both;10 reps;Seated Hip ABduction/ADduction: AROM;Both;10 reps;Supine    General Comments General comments (skin  integrity, edema, etc.): Pt fatigues easily -required rest breaks.  He is on RA today with sats 94% at lowest during therapy.  BP stable with transfers.  HR max 110bpm in standing.      Pertinent Vitals/Pain Pain Assessment: Faces Faces Pain Scale: Hurts little more Pain Location: right side Pain Descriptors / Indicators: Sore;Grimacing Pain Intervention(s): Limited activity within patient's tolerance;Repositioned;Monitored during session    Home Living                      Prior Function            PT Goals (current goals can now be found in the care plan section) Acute Rehab PT Goals Patient Stated Goal: none stated PT Goal Formulation: Patient unable to participate in goal setting Time For Goal Achievement: 06/04/20 Potential to Achieve Goals: Good Progress towards PT goals: Progressing toward goals    Frequency    Min 3X/week      PT Plan Current plan remains appropriate    Co-evaluation              AM-PAC PT "6 Clicks" Mobility   Outcome Measure  Help needed turning from your back to your side while in a flat bed without using bedrails?: A Little Help needed moving from lying on your back to sitting on the side of a flat bed without using bedrails?: A Little Help needed moving to and from a bed to a chair (including a wheelchair)?: A Lot Help needed standing up from a chair using your arms (e.g., wheelchair or bedside chair)?: A Lot Help needed to walk in hospital room?: A Lot Help needed climbing 3-5 steps with a railing? : A Lot 6 Click Score: 14    End of Session   Activity Tolerance: Patient limited by fatigue Patient left: in bed;with call bell/phone within reach;with bed alarm set Nurse Communication: Mobility status (PEG tube was turned off at PT arrival) PT Visit Diagnosis: Unsteadiness on feet (R26.81);Muscle weakness (generalized) (M62.81)     Time: 4650-3546 PT Time Calculation (min) (ACUTE ONLY): 20 min  Charges:   $Therapeutic Activity: 8-22 mins                     Abran Richard, PT Acute Rehab Services Pager [phone] [name] [place] 05/29/2020, 3:51 PM

## 2020-05-30 DIAGNOSIS — J9811 Atelectasis: Secondary | ICD-10-CM | POA: Diagnosis present

## 2020-05-30 DIAGNOSIS — R918 Other nonspecific abnormal finding of lung field: Secondary | ICD-10-CM | POA: Diagnosis present

## 2020-05-30 DIAGNOSIS — R7401 Elevation of levels of liver transaminase levels: Secondary | ICD-10-CM

## 2020-05-30 LAB — CBC WITH DIFFERENTIAL/PLATELET
Abs Immature Granulocytes: 0.07 10*3/uL (ref 0.00–0.07)
Basophils Absolute: 0.1 10*3/uL (ref 0.0–0.1)
Basophils Relative: 1 %
Eosinophils Absolute: 0.4 10*3/uL (ref 0.0–0.5)
Eosinophils Relative: 3 %
HCT: 29.2 % — ABNORMAL LOW (ref 39.0–52.0)
Hemoglobin: 9 g/dL — ABNORMAL LOW (ref 13.0–17.0)
Immature Granulocytes: 1 %
Lymphocytes Relative: 7 %
Lymphs Abs: 0.9 10*3/uL (ref 0.7–4.0)
MCH: 27.1 pg (ref 26.0–34.0)
MCHC: 30.8 g/dL (ref 30.0–36.0)
MCV: 88 fL (ref 80.0–100.0)
Monocytes Absolute: 0.7 10*3/uL (ref 0.1–1.0)
Monocytes Relative: 6 %
Neutro Abs: 10.3 10*3/uL — ABNORMAL HIGH (ref 1.7–7.7)
Neutrophils Relative %: 82 %
Platelets: 719 10*3/uL — ABNORMAL HIGH (ref 150–400)
RBC: 3.32 MIL/uL — ABNORMAL LOW (ref 4.22–5.81)
RDW: 14.9 % (ref 11.5–15.5)
WBC: 12.4 10*3/uL — ABNORMAL HIGH (ref 4.0–10.5)
nRBC: 0.2 % (ref 0.0–0.2)

## 2020-05-30 LAB — COMPREHENSIVE METABOLIC PANEL
ALT: 172 U/L — ABNORMAL HIGH (ref 0–44)
AST: 61 U/L — ABNORMAL HIGH (ref 15–41)
Albumin: 1.8 g/dL — ABNORMAL LOW (ref 3.5–5.0)
Alkaline Phosphatase: 233 U/L — ABNORMAL HIGH (ref 38–126)
Anion gap: 8 (ref 5–15)
BUN: 10 mg/dL (ref 6–20)
CO2: 24 mmol/L (ref 22–32)
Calcium: 8.3 mg/dL — ABNORMAL LOW (ref 8.9–10.3)
Chloride: 102 mmol/L (ref 98–111)
Creatinine, Ser: 0.69 mg/dL (ref 0.61–1.24)
GFR calc Af Amer: >60 mL/min (ref >60)
GFR calc non Af Amer: >60 mL/min (ref >60)
Glucose, Bld: 127 mg/dL — ABNORMAL HIGH (ref 70–99)
Potassium: 4.3 mmol/L (ref 3.5–5.1)
Sodium: 134 mmol/L — ABNORMAL LOW (ref 135–145)
Total Bilirubin: 0.4 mg/dL (ref 0.3–1.2)
Total Protein: 7 g/dL (ref 6.5–8.1)

## 2020-05-30 LAB — GLUCOSE, CAPILLARY
Glucose-Capillary: 115 mg/dL — ABNORMAL HIGH (ref 70–99)
Glucose-Capillary: 120 mg/dL — ABNORMAL HIGH (ref 70–99)
Glucose-Capillary: 124 mg/dL — ABNORMAL HIGH (ref 70–99)
Glucose-Capillary: 134 mg/dL — ABNORMAL HIGH (ref 70–99)
Glucose-Capillary: 137 mg/dL — ABNORMAL HIGH (ref 70–99)
Glucose-Capillary: 150 mg/dL — ABNORMAL HIGH (ref 70–99)

## 2020-05-30 LAB — AEROBIC/ANAEROBIC CULTURE W GRAM STAIN (SURGICAL/DEEP WOUND)

## 2020-05-30 LAB — PHOSPHORUS: Phosphorus: 3.5 mg/dL (ref 2.5–4.6)

## 2020-05-30 NOTE — Plan of Care (Signed)
  Problem: Education: Goal: Knowledge of General Education information will improve Description: Including pain rating scale, medication(s)/side effects and non-pharmacologic comfort measures Outcome: Progressing   Problem: Health Behavior/Discharge Planning: Goal: Ability to manage health-related needs will improve Outcome: Progressing   Problem: Clinical Measurements: Goal: Ability to maintain clinical measurements within normal limits will improve Outcome: Progressing   Problem: Clinical Measurements: Goal: Will remain free from infection Outcome: Progressing   Problem: Clinical Measurements: Goal: Diagnostic test results will improve Outcome: Progressing   Problem: Activity: Goal: Risk for activity intolerance will decrease Outcome: Progressing   Problem: Nutrition: Goal: Adequate nutrition will be maintained Outcome: Progressing   Problem: Pain Managment: Goal: General experience of comfort will improve Outcome: Progressing   Problem: Safety: Goal: Ability to remain free from injury will improve Outcome: Progressing   Problem: Skin Integrity: Goal: Risk for impaired skin integrity will decrease Outcome: Progressing

## 2020-05-30 NOTE — Progress Notes (Signed)
Occupational Therapy Treatment Patient Details Name: Evan Chavez MRN: [id number] DOB: 1961-02-13 Today's Date: 05/30/2020    History of present illness Pt is 59 yo male presents to the hospital on 7/28 with generalized weakness, productive cough and low-grade fever. Pt is currently being treated for hydropneumothorax, Sepsis aspiration pneumonia and esophagal rupture, AKI and acute delirium. Pt had successful R thoracentesis on 8/2. Pt had a chest tube put in on 8/2 and was removed 8/9. Pt also underwent a VATS, decortication, EGD, PEG tube placement, and a esophageal stent on 8/9. PMH of GERD, Barrett's esophagus, peptic ulcer disease with extensive gastric erosion, duodenal ulcer and esophageal stricture, also with history of prior stroke, hyperlipidemia, BPH, remote to cocaine abuse. Pt returned to OR on 8/13 for Right thoracoscopy, chest tube placement, esophageal stent placement.   OT comments  Pt making gradual progress towards OT goals. He remains with flat affect and minimally responsive but is participatory in session and agreeable to working/progressing OOB during session. Pt able take few steps in room x2 (seated rest in between) using RW grossly with minA (+2 safety/equipment). Pt does fatigue easily and requires rest breaks throughout. He requires up to maxA for pericare and LB ADL given R flank discomfort. HR 107 up to 130 bpm with activity and RR up to the 40s, Spo2 >92% on RA. Given pt progress feel he remains appropriate for CIR level therapies at this time. Will continue to follow acutely.   Follow Up Recommendations  CIR    Equipment Recommendations  Other (comment) (TBA)          Precautions / Restrictions Precautions Precautions: Fall Precaution Comments: R chest tube and JP drain; PEG Restrictions Weight Bearing Restrictions: No       Mobility Bed Mobility Overal bed mobility: Needs Assistance Bed Mobility: Supine to Sit Rolling: Min assist;+2 for  safety/equipment   Supine to sit: Min assist;+2 for safety/equipment     General bed mobility comments: increased time; cues for sequencing; assist to lift trunk out of bed  Transfers Overall transfer level: Needs assistance Equipment used: Rolling walker (2 wheeled) Transfers: Sit to/from Stand Sit to Stand: Min assist;+2 physical assistance         General transfer comment: cues for hand placement, min A of 2 and increased time to pwoer up; performed x 2    Balance Overall balance assessment: Needs assistance Sitting-balance support: Single extremity supported;Feet supported Sitting balance-Leahy Scale: Poor Sitting balance - Comments: Reliant on UE support for balance due to weakness   Standing balance support: Single extremity supported;During functional activity Standing balance-Leahy Scale: Poor Standing balance comment: REquired use of RW and min A to steady                           ADL either performed or assessed with clinical judgement   ADL Overall ADL's : Needs assistance/impaired Eating/Feeding: NPO               Upper Body Dressing : Moderate assistance;Sitting Upper Body Dressing Details (indicate cue type and reason): donning new gown         Toileting- Clothing Manipulation and Hygiene: Maximal assistance;+2 for safety/equipment;Sit to/from stand Toileting - Clothing Manipulation Details (indicate cue type and reason): assist for pericare as pt's condom cath off on arrival and bed linens soiled      Functional mobility during ADLs: Minimal assistance;+2 for physical assistance;+2 for safety/equipment;Rolling walker  Cognition Arousal/Alertness: Awake/alert Behavior During Therapy: Flat affect Overall Cognitive Status: Impaired/Different from baseline Area of Impairment: Problem solving;Awareness;Following commands;Attention                   Current Attention Level: Sustained   Following  Commands: Follows one step commands consistently   Awareness: Emergent Problem Solving: Slow processing;Decreased initiation;Difficulty sequencing;Requires verbal cues;Requires tactile cues General Comments: Pt with expressive difficulties at baseline; followed commands with cues        Exercises Other Exercises Other Exercises: gentle AAROM to RUE, to 90*   Shoulder Instructions       General Comments On RA with stable BP and O2.  HR 107 bpm rest and up to 130 bpm max with transfers    Pertinent Vitals/ Pain       Pain Assessment: 0-10 Pain Score: 5  Pain Location: right side Pain Descriptors / Indicators: Sore;Grimacing Pain Intervention(s): Limited activity within patient's tolerance;Monitored during session;Premedicated before session  Home Living                                          Prior Functioning/Environment              Frequency  Min 2X/week        Progress Toward Goals  OT Goals(current goals can now be found in the care plan section)  Progress towards OT goals: OT to reassess next treatment  Acute Rehab OT Goals Patient Stated Goal: none stated OT Goal Formulation: With patient Time For Goal Achievement: 06/05/20 Potential to Achieve Goals: Good ADL Goals Pt Will Perform Grooming: with set-up;standing;sitting Pt Will Perform Lower Body Dressing: with set-up;sit to/from stand Pt Will Transfer to Toilet: with set-up;ambulating Additional ADL Goal #1: Pt will demonstrate independence with use of 3-5 energy conservation strategies during ADL/IADL and functional mobility.  Plan Discharge plan remains appropriate    Co-evaluation    PT/OT/SLP Co-Evaluation/Treatment: Yes Reason for Co-Treatment: Complexity of the patient's impairments (multi-system involvement);For patient/therapist safety (pt with limited tolerance to multiple treats) PT goals addressed during session: Mobility/safety with mobility OT goals addressed during  session: ADL's and self-care      AM-PAC OT "6 Clicks" Daily Activity     Outcome Measure   Help from another person eating meals?: Total (NPO) Help from another person taking care of personal grooming?: A Little Help from another person toileting, which includes using toliet, bedpan, or urinal?: A Lot Help from another person bathing (including washing, rinsing, drying)?: A Lot Help from another person to put on and taking off regular upper body clothing?: A Little Help from another person to put on and taking off regular lower body clothing?: A Lot 6 Click Score: 13    End of Session Equipment Utilized During Treatment: Gait belt;Rolling walker  OT Visit Diagnosis: Unsteadiness on feet (R26.81);Other abnormalities of gait and mobility (R26.89);Muscle weakness (generalized) (M62.81);Other symptoms and signs involving cognitive function;Feeding difficulties (R63.3);Pain Pain - Right/Left: Right Pain - part of body:  (flank)   Activity Tolerance Patient tolerated treatment well   Patient Left in chair;with call bell/phone within reach;with chair alarm set   Nurse Communication Mobility status        Time: 3382-5053 OT Time Calculation (min): 31 min  Charges: OT General Charges $OT Visit: 1 Visit OT Treatments $Self Care/Home Management : 8-22 mins  Lou Cal, [place] Pager  [place] Phyllistine Domingos 05/30/2020, 4:44 PM

## 2020-05-30 NOTE — Progress Notes (Addendum)
      [address]       [place] [address]             [phone]      5 Days Post-Op Procedure(s) (LRB): ESOPHAGOGASTRODUODENOSCOPY (EGD); ESOPHAGEAL STENT REMOVAL. (N/A) ESOPHAGEAL STENT PLACEMENT USING A 23 MM COVERED STENT (N/A) VIDEO ASSISTED THORACOSCOPY (VATS)/EMPYEMA (Right) Subjective: Feels okay this morning. States that no one came to talk to him about a discharge plan however, TOC has a note in the chart from 4pm yesterday  Objective: Vital signs in last 24 hours: Temp:  [98.6 F (37 C)-98.8 F (37.1 C)] 98.7 F (37.1 C) (08/18 0735) Pulse Rate:  [91-110] 101 (08/18 0735) Cardiac Rhythm: Sinus tachycardia (08/18 0705) Resp:  [20-29] 25 (08/18 0735) BP: (133-148)/(86-87) 148/86 (08/18 0735) SpO2:  [95 %-100 %] 100 % (08/18 0740) Weight:  [60 kg] 60 kg (08/18 0601)     Intake/Output from previous day: 08/17 0701 - 08/18 0700 In: 1902.6 [NG/GT:505; IV Piggyback:1397.6] Out: 1980 [Urine:1900; Chest Tube:80] Intake/Output this shift: No intake/output data recorded.  General appearance: alert, cooperative and no distress Heart: regular rate and rhythm, S1, S2 normal, no murmur, click, rub or gallop Lungs: clear to auscultation bilaterally Abdomen: soft, non-tender; bowel sounds normal; no masses,  no organomegaly Extremities: extremities normal, atraumatic, no cyanosis or edema Wound: clean and dry  Lab Results: Recent Labs    05/29/20 0033 05/30/20 0102  WBC 22.2* 12.4*  HGB 8.6* 9.0*  HCT 27.8* 29.2*  PLT 653* 719*   BMET:  Recent Labs    05/29/20 0033 05/30/20 0102  NA 136 134*  K 4.3 4.3  CL 106 102  CO2 20* 24  GLUCOSE 140* 127*  BUN 12 10  CREATININE 0.76 0.69  CALCIUM 8.1* 8.3*    PT/INR: No results for input(s): LABPROT, INR in the last 72 hours. ABG    Component Value Date/Time   PHART 7.443 05/26/2020 0335   HCO3 23.5 05/26/2020 0335   TCO2 27 05/17/2020 2036   ACIDBASEDEF 0.1 05/26/2020 0335   O2SAT 96.7  05/26/2020 0335   CBG (last 3)  Recent Labs    05/29/20 2149 05/29/20 2350 05/30/20 0353  GLUCAP 143* 134* 120*    Assessment/Plan: S/P Procedure(s) (LRB): ESOPHAGOGASTRODUODENOSCOPY (EGD); ESOPHAGEAL STENT REMOVAL. (N/A) ESOPHAGEAL STENT PLACEMENT USING A 23 MM COVERED STENT (N/A) VIDEO ASSISTED THORACOSCOPY (VATS)/EMPYEMA (Right)  1. CV-NSR in the 90s to sinus tachycardia. BP well controlled. Continue IV hydralazine and IV metoprolol for control. Cardiology following along. 2. Pulm-last CXR much improved. Continue chest PT and mucomyst. Continue incentive spirometer. Chest tube in place with only 80cc of drainage.  3. Renal-creatinine 0.76, electrolytes okay 4. H and H 9.0/29.2 which has decreased from yesterday.  5. Endo-blood glucose well controlled. TSH in normal range. 6. GI- continue TF @ 36mL/hr. Swallow study shows no leak. Remains NPO 7. ID- WBC 12.4 continue vanc and zosyn  Plan: TOC working with the patient on SNF placement. No diet yet, remains NPO. If WBC continues to decrease, will start clear fluid tomorrow.     LOS: 19 days    Evan Chavez 05/30/2020  Agree with above No events overnight Tolerating tubefeeds dispo planning.  [name]

## 2020-05-30 NOTE — Progress Notes (Signed)
Physical Therapy Treatment Patient Details Name: Kendal Raffo MRN: [id number] DOB: 1961-07-23 Today's Date: 05/30/2020    History of Present Illness Pt is 59 yo male presents to the hospital on 7/28 with generalized weakness, productive cough and low-grade fever. Pt is currently being treated for hydropneumothorax, Sepsis aspiration pneumonia and esophagal rupture, AKI and acute delirium. Pt had successful R thoracentesis on 8/2. Pt had a chest tube put in on 8/2 and was removed 8/9. Pt also underwent a VATS, decortication, EGD, PEG tube placement, and a esophageal stent on 8/9. PMH of GERD, Barrett's esophagus, peptic ulcer disease with extensive gastric erosion, duodenal ulcer and esophageal stricture, also with history of prior stroke, hyperlipidemia, BPH, remote to cocaine abuse. Pt returned to OR on 8/13 for Right thoracoscopy, chest tube placement, esophageal stent placement.    PT Comments    Pt demonstrating good progress today.  With premedication for pain, he was willing to work with therapy, walk, and sit in chair.  Required min A of 2 for safety with lines/leads and for assist.  Required cues for sequencing, completing task, and transfer technique/safety.  Did have elevated HR with activity but recovered quickly and all other VSS.       Follow Up Recommendations  CIR     Equipment Recommendations  Wheelchair (measurements PT);Wheelchair cushion (measurements PT);Hospital bed;3in1 (PT);Rolling walker with 5" wheels (to be further assessed)    Recommendations for Other Services       Precautions / Restrictions Precautions Precautions: Fall Precaution Comments: R chest tube and JP drain; PEG    Mobility  Bed Mobility Overal bed mobility: Needs Assistance Bed Mobility: Supine to Sit Rolling: Min assist;+2 for safety/equipment   Supine to sit: Min assist;+2 for safety/equipment     General bed mobility comments: increased time; cues for sequencing; assist to lift  trunk out of bed  Transfers Overall transfer level: Needs assistance Equipment used: Rolling walker (2 wheeled) Transfers: Sit to/from Stand Sit to Stand: Min assist;+2 physical assistance         General transfer comment: cues for hand placement, min A of 2 and increased time to pwoer up; performed x 2  Ambulation/Gait Ambulation/Gait assistance: Min assist;+2 safety/equipment Gait Distance (Feet): 8 Feet (8' then 5') Assistive device: Rolling walker (2 wheeled) Gait Pattern/deviations: Step-to pattern;Shuffle;Trunk flexed Gait velocity: decreased   General Gait Details: cued for RW, posture, and to back all of the way prior to sitting   Stairs             Wheelchair Mobility    Modified Rankin (Stroke Patients Only)       Balance Overall balance assessment: Needs assistance Sitting-balance support: Single extremity supported;Feet supported Sitting balance-Leahy Scale: Poor Sitting balance - Comments: Reliant on UE support for balance due to weakness   Standing balance support: Single extremity supported;During functional activity Standing balance-Leahy Scale: Poor Standing balance comment: REquired use of RW and min A to steady                            Cognition Arousal/Alertness: Awake/alert Behavior During Therapy: Flat affect Overall Cognitive Status: Impaired/Different from baseline Area of Impairment: Problem solving;Awareness;Following commands;Attention                   Current Attention Level: Sustained   Following Commands: Follows one step commands consistently   Awareness: Emergent Problem Solving: Slow processing;Decreased initiation;Difficulty sequencing;Requires verbal cues;Requires tactile cues General Comments:  Pt with expressive difficulties at baseline; followed commands with cues      Exercises      General Comments General comments (skin integrity, edema, etc.): On RA with stable BP and O2.  HR 107 bpm  rest and up to 130 bpm max with transfers      Pertinent Vitals/Pain Pain Assessment: 0-10 Pain Score: 5  Pain Location: right side Pain Descriptors / Indicators: Sore;Grimacing Pain Intervention(s): Limited activity within patient's tolerance;Premedicated before session;Monitored during session;Repositioned (8/10 pre, RN medicated, 5/10)    Home Living                      Prior Function            PT Goals (current goals can now be found in the care plan section) Acute Rehab PT Goals Patient Stated Goal: none stated PT Goal Formulation: Patient unable to participate in goal setting Time For Goal Achievement: 06/04/20 Potential to Achieve Goals: Good Progress towards PT goals: Progressing toward goals    Frequency    Min 3X/week      PT Plan Discharge plan needs to be updated    Co-evaluation PT/OT/SLP Co-Evaluation/Treatment: Yes Reason for Co-Treatment: Complexity of the patient's impairments (multi-system involvement);For patient/therapist safety (pt with limited tolerance today for multiple treats) PT goals addressed during session: Mobility/safety with mobility OT goals addressed during session: ADL's and self-care      AM-PAC PT "6 Clicks" Mobility   Outcome Measure  Help needed turning from your back to your side while in a flat bed without using bedrails?: A Little Help needed moving from lying on your back to sitting on the side of a flat bed without using bedrails?: A Little Help needed moving to and from a bed to a chair (including a wheelchair)?: A Little Help needed standing up from a chair using your arms (e.g., wheelchair or bedside chair)?: A Little Help needed to walk in hospital room?: A Lot Help needed climbing 3-5 steps with a railing? : A Lot 6 Click Score: 16    End of Session Equipment Utilized During Treatment: Gait belt Activity Tolerance: Patient tolerated treatment well Patient left: with call bell/phone within reach;with  chair alarm set;in chair Nurse Communication: Mobility status PT Visit Diagnosis: Unsteadiness on feet (R26.81);Muscle weakness (generalized) (M62.81)     Time: 0240-9735 PT Time Calculation (min) (ACUTE ONLY): 33 min  Charges:  $Gait Training: 8-22 mins                     Abran Richard, PT Acute Rehab Services Pager [phone] [name] [place] 05/30/2020, 3:16 PM

## 2020-05-30 NOTE — Plan of Care (Signed)

## 2020-05-30 NOTE — Progress Notes (Addendum)
[facility]  PROGRESS NOTE  Evan Chavez ASN:[id number] DOB: Oct 06, 1961 DOA: 05/10/2020 PCP: Rosita Fire, MD Admit date - 05/10/2020   Admitting Physician Lajuana Matte, MD  Outpatient Primary MD for the patient is Rosita Fire, MD  LOS - 19 Brief Narrative   Evan Chavez is a 59 y.o. year old male with medical history significant for GERD, Barrett's esophagus, peptic ulcer disease with extensive gastric erosion, duodenal ulcerand esophageal stricture status post balloon dilatation on 12/2018, also with history of prior stroke, hyperlipidemia, BPH, remote to cocaine abuse. who presented on 05/10/2020 with generalized weakness, productive cough and low-grade fever shortness of breath initially admitted for pneumonia based off CTA chest showing bibasilar consolidation pleural effusions treated with ceftriaxone and azithromycin. Hospital course complicated by right-sided hydropneumothorax with possible lung collapse after right thoracentesis on 8/2, esophageal perforation (CT chest on 8/4)With large right-sided hydropneumothorax, complicated by aspiration pneumonia.  Patient underwent right-sided chest tube placement was transferred from [facility] to [facility] where he underwent VATS and decortication by CT surgery as well as esophageal stent placement and PEG tube placement.  Patient has been managed on hospitalist service since.  Did have episodes of persistent leakage from esophagus on esophagram as well as requiring right thorascopic for drainage of loculated pleural effusion on 8/13, repeat esophagram on 8/16 did not show any esophageal leak. Currently hospitalist service is following as a consult and CT surgery is primary team  Subjective  Today he has no acute complaints  A & P   Right base atelectasis/infiltrate, persistent on chest x-ray from 8/17.  No new O2 requirements, remains afebrile, white count slowly downtrending.  Patient previously on empiric  antibiotics with vancomycin and Zosyn (last dose of Vanco on 8/17) last dose of Zosyn 8/18 -Continue antibiotics per CT surgery guidance -Monitor CBC, fever/temp trend  Sepsis secondary to aspiration pneumonia and mediastinitis.  In setting of esophageal rupture.  Now resolved.  Remains afebrile, white count continues to downtrend -Antibiotics as above -Monitor CBC  Esophageal rupture leading to right-sided hydropneumothorax and right-sided complex loculated parapneumonic effusion.  Status post chest tube -CT surgery monitoring chest tube management -Continue Mucomyst, flutter valve, chest PT-  N.p.o. status in the setting of esophageal rupture -Continue tube feeds -If white count remains stable may be able to transition to clear liquids per CT surgery  Acute on chronic blood loss anemia.  In the setting of blood loss from esophageal rupture and surgery as well as hematemesis during hospitalization.  Hemoglobin currently stable  Hypophosphatemia, resolved. Improved with IV supplementation  Impaired mobility/generalized weakness -PT recommends SNF -Social worker consulted -Monitor CBC, transfuse if hemoglobin less than 7  Intermittent delirium.  Likely hospital-acquired given prolonged course.  Currently alert and oriented to place, self.  No longer restless or agitated. -Delirium precautions -As needed IV Haldol has been helpful   Paroxysmal atrial fibrillation.  No longer A. fib.   --Cardiology advised discontinuing diltiazem, Continuing metoprolol 50 mg twice daily as well as DOAC when he is able to tolerate  Prior CVA prior to admission -Holding home aspirin 325 -We will likely resume Lipitor on discharge, continue to monitor LFTs  Elevated LFTs, continue to downtrend Denies any abdominal pain -Continue to hold home statins -Check hepatitis panel for thoroughness       Family Communication  :  none  Code Status :  FULL  Disposition Plan  :  Patient is from home.  Anticipated d/c date: 2 to 3 days. Barriers  to d/c or necessity for inpatient status:  Discharge to SNF once medically stable, still needing final disposition for anticoagulation, IV antibiotics, p.o. status Consults  :  CTS, Cardiology  Procedures  :    DVT Prophylaxis  :  SCDs   Lab Results  Component Value Date   PLT 719 (H) 05/30/2020    Diet :  Diet Order            Diet NPO time specified  Diet effective now                  Inpatient Medications Scheduled Meds: . acetylcysteine  2 mL Nebulization TID  . bisacodyl  10 mg Oral Daily  . feeding supplement (PROSource TF)  45 mL Per Tube BID  . insulin aspart  0-24 Units Subcutaneous TID AC & HS  . mouth rinse  15 mL Mouth Rinse q12n4p  . metoprolol tartrate  50 mg Per Tube BID  . pantoprazole (PROTONIX) IV  40 mg Intravenous Q12H  . sennosides  5 mL Per Tube QHS   Continuous Infusions: . sodium chloride 70 mL/hr at 05/16/20 2122  . sodium chloride    . sodium chloride    . feeding supplement (OSMOLITE 1.5 CAL) 55 mL/hr at 05/30/20 1500  . fluconazole (DIFLUCAN) IV Stopped (05/30/20 1035)   PRN Meds:.sodium chloride, Place/Maintain arterial line **AND** sodium chloride, Place/Maintain arterial line **AND** sodium chloride, [DISCONTINUED] acetaminophen **OR** acetaminophen, fentaNYL (SUBLIMAZE) injection, hydrALAZINE, ipratropium-albuterol, metoprolol tartrate, ondansetron (ZOFRAN) IV  Antibiotics  :   Anti-infectives (From admission, onward)   Start     Dose/Rate Route Frequency Ordered Stop   05/29/20 1800  vancomycin (VANCOCIN) IVPB 1000 mg/200 mL premix  Status:  Discontinued        1,000 mg 200 mL/hr over 60 Minutes Intravenous Every 12 hours 05/29/20 1151 05/30/20 0912   05/24/20 2100  vancomycin (VANCOREADY) IVPB 750 mg/150 mL  Status:  Discontinued        750 mg 150 mL/hr over 60 Minutes Intravenous Every 12 hours 05/24/20 0813 05/29/20 1151   05/24/20 0900  vancomycin (VANCOREADY) IVPB 1500 mg/300 mL         1,500 mg 150 mL/hr over 120 Minutes Intravenous  Once 05/24/20 0813 05/24/20 1221   05/17/20 0900  fluconazole (DIFLUCAN) IVPB 400 mg     Discontinue     400 mg 100 mL/hr over 120 Minutes Intravenous Every 24 hours 05/17/20 0749     05/17/20 0600  vancomycin (VANCOREADY) IVPB 750 mg/150 mL  Status:  Discontinued        750 mg 150 mL/hr over 60 Minutes Intravenous Every 8 hours 05/16/20 2154 05/17/20 0903   05/17/20 0400  vancomycin (VANCOREADY) IVPB 750 mg/150 mL  Status:  Discontinued        750 mg 150 mL/hr over 60 Minutes Intravenous Every 8 hours 05/16/20 1945 05/16/20 2154   05/16/20 2200  vancomycin (VANCOREADY) IVPB 1250 mg/250 mL        1,250 mg 166.7 mL/hr over 90 Minutes Intravenous STAT 05/16/20 2153 05/16/20 2348   05/16/20 2000  vancomycin (VANCOREADY) IVPB 1250 mg/250 mL  Status:  Discontinued        1,250 mg 166.7 mL/hr over 90 Minutes Intravenous  Once 05/16/20 1945 05/16/20 2153   05/15/20 1600  piperacillin-tazobactam (ZOSYN) IVPB 3.375 g  Status:  Discontinued        3.375 g 12.5 mL/hr over 240 Minutes Intravenous Every 8 hours 05/15/20 0945 05/30/20 0912  05/15/20 0930  piperacillin-tazobactam (ZOSYN) IVPB 3.375 g        3.375 g 100 mL/hr over 30 Minutes Intravenous  Once 05/15/20 0921 05/15/20 1009   05/13/20 2000  azithromycin (ZITHROMAX) tablet 500 mg        500 mg Oral Daily 05/13/20 1155 05/14/20 2127   05/10/20 2200  cefTRIAXone (ROCEPHIN) 2 g in sodium chloride 0.9 % 100 mL IVPB  Status:  Discontinued        2 g 200 mL/hr over 30 Minutes Intravenous Every 24 hours 05/10/20 2148 05/15/20 0911   05/10/20 2200  azithromycin (ZITHROMAX) 500 mg in sodium chloride 0.9 % 250 mL IVPB  Status:  Discontinued        500 mg 250 mL/hr over 60 Minutes Intravenous Every 24 hours 05/10/20 2148 05/13/20 1155       Objective   Vitals:   05/30/20 1412 05/30/20 1459 05/30/20 1557 05/30/20 1600  BP:   (!) 143/85   Pulse:      Resp: (!) 25  16 (!) 25  Temp:   98.4  F (36.9 C)   TempSrc:   Oral   SpO2:  98%    Weight:      Height:        SpO2: 98 % O2 Flow Rate (L/min): 4 L/min FiO2 (%): 21 %  Wt Readings from Last 3 Encounters:  05/30/20 60 kg  05/09/20 69.4 kg  12/13/19 68.2 kg     Intake/Output Summary (Last 24 hours) at 05/30/2020 1812 Last data filed at 05/30/2020 1646 Gross per 24 hour  Intake 945 ml  Output 1700 ml  Net -755 ml    Physical Exam:     Awake Alert, Oriented X 3, flat affect No respiratory effort on room air, diminished breath sounds on right side with chest tube in place with minimal output Abdomen soft, nondistended, PEG tube in place RRR,No Gallops,Rubs or new Murmurs,     I have personally reviewed the following:   Data Reviewed:  CBC Recent Labs  Lab 05/24/20 0342 05/24/20 0342 05/26/20 0331 05/27/20 0332 05/28/20 0107 05/29/20 0033 05/30/20 0102  WBC 25.7*   < > 19.5* 13.5* 20.3* 22.2* 12.4*  HGB 9.2*   < > 8.5* 9.4* 10.5* 8.6* 9.0*  HCT 28.3*   < > 26.8* 29.7* 32.7* 27.8* 29.2*  PLT 488*   < > 549* 573* 545* 653* 719*  MCV 85.8   < > 85.4 87.9 86.3 86.9 88.0  MCH 27.9   < > 27.1 27.8 27.7 26.9 27.1  MCHC 32.5   < > 31.7 31.6 32.1 30.9 30.8  RDW 14.9   < > 15.2 14.9 15.0 15.0 14.9  LYMPHSABS 1.2  --   --   --  1.1 1.2 0.9  MONOABS 1.2*  --   --   --  1.4* 1.3* 0.7  EOSABS 0.1  --   --   --  0.0 0.2 0.4  BASOSABS 0.1  --   --   --  0.1 0.1 0.1   < > = values in this interval not displayed.    Chemistries  Recent Labs  Lab 05/24/20 0342 05/24/20 0342 05/26/20 0331 05/27/20 0332 05/28/20 0107 05/29/20 0033 05/30/20 0102  NA 135   < > 135 135 136 136 134*  K 4.1   < > 4.3 4.0 4.3 4.3 4.3  CL 105   < > 103 105 105 106 102  CO2 20*   < > 22  20* 21* 20* 24  GLUCOSE 170*   < > 115* 160* 141* 140* 127*  BUN 11   < > 11 12 10 12 10   CREATININE 0.85   < > 0.84 0.79 0.82 0.76 0.69  CALCIUM 8.1*   < > 7.9* 8.2* 8.0* 8.1* 8.3*  MG 2.1  --   --   --  2.1  --   --   AST  --   --   --   209* 65* 37 61*  ALT  --   --   --  313* 247* 157* 172*  ALKPHOS  --   --   --  238* 252* 210* 233*  BILITOT  --   --   --  0.6 0.3 0.4 0.4   < > = values in this interval not displayed.   ------------------------------------------------------------------------------------------------------------------ No results for input(s): CHOL, HDL, LDLCALC, TRIG, CHOLHDL, LDLDIRECT in the last 72 hours.  Lab Results  Component Value Date   HGBA1C 5.5 07/12/2018   ------------------------------------------------------------------------------------------------------------------ No results for input(s): TSH, T4TOTAL, T3FREE, THYROIDAB in the last 72 hours.  Invalid input(s): FREET3 ------------------------------------------------------------------------------------------------------------------ No results for input(s): VITAMINB12, FOLATE, FERRITIN, TIBC, IRON, RETICCTPCT in the last 72 hours.  Coagulation profile No results for input(s): INR, PROTIME in the last 168 hours.  No results for input(s): DDIMER in the last 72 hours.  Cardiac Enzymes No results for input(s): CKMB, TROPONINI, MYOGLOBIN in the last 168 hours.  Invalid input(s): CK ------------------------------------------------------------------------------------------------------------------    Component Value Date/Time   BNP 68.0 05/14/2020 0604    Micro Results Recent Results (from the past 240 hour(s))  Aerobic/Anaerobic Culture (surgical/deep wound)     Status: None   Collection Time: 05/25/20  9:03 AM   Specimen: PATH Other; Body Fluid  Result Value Ref Range Status   Specimen Description FLUID  Final   Special Requests EMPYEMA SPEC A  Final   Gram Stain   Final    ABUNDANT WBC PRESENT, PREDOMINANTLY PMN NO ORGANISMS SEEN    Culture   Final    RARE CANDIDA PARAPSILOSIS NO ANAEROBES ISOLATED Performed at [facility], [place] [address], [place], [place] [address]    Report Status 05/30/2020 FINAL  Final     Radiology Reports DG Chest 1 View  Result Date: 05/26/2020 CLINICAL DATA:  Esophageal anastomotic leak EXAM: CHEST  1 VIEW COMPARISON:  05/25/2020 FINDINGS: Right chest tubes and esophageal stent are again noted. There is new near total opacification of the right hemithorax. Right pleural effusion is present. Left lung remains clear. No pneumothorax. Cardiomediastinal contours are partially obscured. There is mild rightward mediastinal shift. IMPRESSION: New near total opacification of the right hemithorax likely reflecting combination of effusion and atelectasis. Electronically Signed   By: Macy Mis M.D.   On: 05/26/2020 08:21   DG Chest 1 View  Result Date: 05/25/2020 CLINICAL DATA:  Esophageal stent placement. EXAM: DG C-ARM 1-60 MIN; CHEST  1 VIEW FLUOROSCOPY TIME:  Fluoro time: 4 Minutes 17 secondsRadiation exposure index: MGy: 41.69Number of acquired spot images: Five spot fluoro graphic images. COMPARISON:  May 17, 2020.  May 23, 2020 FINDINGS: Spot fluoroscopy images were obtained for surgical planning purposes. Esophageal stent is seen traversing the thorax. Enteric contrast traverses the stent and enters the proximal stomach. None of the provided radiographs demonstrate evaluation of the site of previous leak into the RIGHT pleural space. Selective LEFT mainstem intubation. IMPRESSION: Spot fluoroscopy images from surgical planning purposes. Please reference procedure report for  further details. Electronically Signed   By: Valentino Saxon MD   On: 05/25/2020 10:59   DG Chest 1 View  Result Date: 05/17/2020 CLINICAL DATA:  Surgery. Esophageal stent placement. EXAM: DG C-ARM 1-60 MIN; CHEST  1 VIEW FLUOROSCOPY TIME:  Fluoroscopy Time:  5 minutes 48 seconds Radiation Exposure Index (if provided by the fluoroscopic device): 59.06 mGy Number of Acquired Spot Images: 3 COMPARISON:  Preprocedural CT yesterday. FINDINGS: Three fluoroscopic spot views obtained in the operating room.  Endoscope is in place. A stent overlies the endoscope with tip projecting over the esophageal diaphragmatic hiatus. Subsequent enteric tube courses in the stent. There is contrast opacifying the distal stent on image 1. Right chest tube is in place. IMPRESSION: Intraoperative fluoroscopy during esophageal stent placement. Please reference procedural report for details. Electronically Signed   By: Keith Rake M.D.   On: 05/17/2020 19:55   DG Chest 1 View  Result Date: 05/14/2020 CLINICAL DATA:  Complicated RIGHT pleural effusion post diagnostic thoracentesis EXAM: CHEST  1 VIEW COMPARISON:  05/10/2020 FINDINGS: Enlargement of cardiac silhouette. Mediastinal contours and pulmonary vascularity normal. Persistent RIGHT pleural effusion and significant atelectasis and/or consolidation of the mid to lower RIGHT lung. New lucency at the RIGHT apex, suspect representing apical pneumothorax. No mediastinal shift. Patient asymptomatic. Persistent infiltrate LEFT lower lobe and tiny LEFT pleural effusion. IMPRESSION: Probable RIGHT apical pneumothorax post diagnostic thoracentesis. Persistent complicated partially loculated RIGHT pleural effusion, RIGHT basilar atelectasis versus consolidation, and LEFT lower lobe consolidation. Patient asymptomatic. Findings discussed with Dr. Elsworth Soho on 05/14/2020 at 1545 hours. Electronically Signed   By: Lavonia Dana M.D.   On: 05/14/2020 15:51   CT CHEST WO CONTRAST  Result Date: 05/16/2020 CLINICAL DATA:  Pleural effusion. EXAM: CT CHEST WITHOUT CONTRAST TECHNIQUE: Multidetector CT imaging of the chest was performed following the standard protocol without IV contrast. COMPARISON:  CT dated May 10, 2020 FINDINGS: Cardiovascular: The heart size is stable. There is some shift of the mediastinum to the left. There is a trace pericardial effusion. Mild atherosclerotic changes are noted of the thoracic aorta. Mediastinum/Nodes: --mild mediastinal adenopathy is noted -- there is mild  bilateral hilar adenopathy. -- No axillary lymphadenopathy. -- No supraclavicular lymphadenopathy. --there is a left-sided thyroid nodule measuring approximately 2.4 cm. -the esophagus is diffusely thickened. There appears to be a defect in the right lateral aspect of the mid to distal esophagus (axial series 3, image 82-83). Adjacent to this defect is extraluminal oral contrast tracking into the right pleural space. Lungs/Pleura: There is been interval placement of a right-sided chest tube. There is a large right-sided hydropneumothorax which has increased in size since the patient's prior study despite placement of a right-sided chest tube. Additionally. The pleural effusion component appears more complex than prior study. There is new extensive debris within bronchus intermedius as well as the right lower lobe and right middle lobe bronchi consistent with significant interval aspiration. The right middle lobe and right lower lobe are essentially collapsed. There are hypoattenuating components within the right middle and right lower lobes concerning for developing aspiration pneumonia. There are new areas of consolidation involving the left lower and left upper lobes concerning for aspiration. There is a small to moderate-sized left-sided pleural effusion. The chest tube is kinked at its distal most aspect. Upper Abdomen: There is no definite acute abnormality in the upper abdomen. Musculoskeletal: No chest wall abnormality. No bony spinal canal stenosis. IMPRESSION: 1. Overall findings consistent with esophageal perforation arising from the right lateral  aspect of the mid to distal esophagus as detailed above. There is extraluminal oral contrast tracking from this esophageal wall defect into the right pleural space. 2. Large right-sided hydropneumothorax with significant interval worsening since the prior study despite right-sided chest tube placement. Additionally, the fluid component appears more complex. The  right-sided chest tube appears to enter the thoracic cavity inferiorly and courses along the right hemidiaphragm and terminates in the region of the right epicardial fat pad. The tube is kinked at its tip. While the tube is likely intrathoracic, it will need significant repositioning to be effective. 3. Findings consistent with interval aspiration with complete occlusion of bronchus intermedius as well as the right middle and right lower lobe bronchi. There is likely developing aspiration pneumonia within the collapsed right middle and right lower lobes as well as the posterior left upper and left lower lobes. There is a small left-sided pleural effusion. 4. Persistent diffuse esophageal wall thickening of unknown clinical significance. Correlation with endoscopy is recommended as an underlying mass is not excluded. 5. Again noted is a left-sided thyroid nodule measuring approximately 2.4 cm. Outpatient thyroid ultrasound follow-up is recommended.(Ref: J Am Coll Radiol. 2015 Feb;12(2): 143-50). These results were called by telephone at the time of interpretation on 05/16/2020 at 6:04 pm to provider [facility] , who verbally acknowledged these results. Electronically Signed   By: Constance Holster M.D.   On: 05/16/2020 18:04   CT CHEST W CONTRAST  Result Date: 05/24/2020 CLINICAL DATA:  Esophageal perforation, progressive leukocytosis, abnormal chest x-ray, dyspnea EXAM: CT CHEST WITH CONTRAST TECHNIQUE: Multidetector CT imaging of the chest was performed during intravenous contrast administration. CONTRAST:  36mL OMNIPAQUE IOHEXOL 350 MG/ML SOLN COMPARISON:  Chest radiograph 05/22/2020, CT 05/10/2020 FINDINGS: Cardiovascular: Extensive coronary artery calcification. Global cardiac size within normal limits. Trace pericardial fluid is likely physiologic in is stable since prior examination. Thoracic aorta is age-appropriate. Mediastinum/Nodes: And endoluminal stent graft has been placed within the esophagus  extending from the level of the sternal notch within the proximal esophagus through the mid to distal esophagus at the level of the left atrium. The stent graft is not completely appose the esophageal lumen proximally, best seen on axial image # 27/3. Additionally, there is a defect involving the right posterolateral aspect of the esophagus, best seen on axial image # 77/3, with an adjacent loculated collection of gas, fluid, and extravasated contrast measuring 4.2 x 7.6 x 8.4 cm on axial image # 82 and sagittal image # 77. The distal margin of the stent graft does appear to completely apposed the esophageal lumen, though this is extremely close to the point of perforation. The distal esophagus again demonstrates marked circumferential wall thickening in keeping with esophagitis. Numerous shotty enhancing right paratracheal lymph nodes are identified, likely reactive. No pathologic thoracic adenopathy. Largely cystic nodule again identified within the left thyroid lobe, unchanged. Lungs/Pleura: Right chest tube is in place extending posteriorly and apically. There is collapse and consolidation of the right middle and lower lobes. There is partially loculated gas and fluid within the right pleural space laterally and within the major inter lobar fissure. The pleural fluid component is relatively small and communicates with the chest tube and has significantly decreased in volumes since prior examination, both in its free-flowing component as well as the a loculated component within the inter lobar fissure. The previously noted extravasated collection, however, does appear isolated from the chest tube. Bronchial wall thickening within the aerated right upper lobe is in keeping with  airway inflammation. The central airways of the right lower lobe appear impacted and fluid filled. Minimal infiltrate is seen within the basilar lingula and left lower lobe, infectious or inflammatory in nature. No central obstructing mass.  No pneumothorax or pleural effusion on the left. Upper Abdomen: Left hydronephrosis is partially visualized, incompletely assessed on this examination. This appears similar to that noted on prior examination of 05/13/2020. Musculoskeletal: No acute bone abnormality. IMPRESSION: Interval placement of right chest tube with significant evacuation loculated right pleural fluid. A small free-flowing fluid in gas component remains as well as a small partially loculated component within the inter lobar fissure. Interval placement of an and luminal stent graft within the esophagus with incomplete apposition of the graft proximally likely resulting in contrast coursing between the stent graft and the esophageal lumen to the point of leakage noted on prior esophagram. Pilar Plate perforation of the right posterolateral mid to distal esophagus again noted. Associated loculated extravasated contrast and fluid collection within the right pleural space appears isolated from the adjacent right chest tube. Collapse and consolidation of the right middle and right lower lobe with extensive fluid opacification of the right lower lobe airways. Airway inflammation involving the right upper lobe and probable minimal infectious or inflammatory infiltrate within the left lung base. Left hydronephrosis, similar to that noted on prior examination, incompletely evaluated on this examination. Electronically Signed   By: Fidela Salisbury MD   On: 05/24/2020 15:21   CT Angio Chest PE W and/or Wo Contrast  Result Date: 05/11/2020 CLINICAL DATA:  Shortness of breath and weakness for several days EXAM: CT ANGIOGRAPHY CHEST WITH CONTRAST TECHNIQUE: Multidetector CT imaging of the chest was performed using the standard protocol during bolus administration of intravenous contrast. Multiplanar CT image reconstructions and MIPs were obtained to evaluate the vascular anatomy. CONTRAST:  23mL OMNIPAQUE IOHEXOL 350 MG/ML SOLN COMPARISON:  Chest x-ray from  earlier in the same day, CTA from 07/12/2018. FINDINGS: Cardiovascular: Thoracic aorta demonstrates atherosclerotic calcifications. No aneurysmal dilatation or dissection is noted. No cardiac enlargement is seen. Coronary calcifications are noted. The pulmonary artery shows a normal branching pattern. No definitive filling defect is identified to suggest pulmonary embolism. Mediastinum/Nodes: Thoracic inlet shows a hypodensity within the left lobe of the thyroid measuring 17 mm in greatest dimension. This is relatively stable from a prior CT examination from 2019. No sizable hilar or mediastinal adenopathy is noted. The esophagus demonstrates some diffuse wall thickening which may be related to reflux esophagitis. Lungs/Pleura: Bilateral moderate pleural effusions are seen. Associated lower lobe consolidation is noted. No sizable parenchymal nodules are seen. No pneumothorax is noted. Upper Abdomen: Visualized upper abdomen shows geographic decreased attenuation within the left kidney and enlargement of the left kidney. This may simply be related to hydronephrosis. The possibility of an infiltrating mass deserves consideration. This can be further evaluated with CT of the abdomen and pelvis with contrast. Musculoskeletal: No acute bony abnormality is noted. Review of the MIP images confirms the above findings. IMPRESSION: No evidence of pulmonary emboli. Bilateral pleural effusions and lower lobe consolidation similar to that seen on recent chest x-ray. This is consistent with bibasilar pneumonia and reactive effusions. Diffuse wall thickening throughout the esophagus likely related to reflux esophagitis. Clinical correlation is recommended. It should be noted this is stable from a prior exam of 2019. Enlargement of the left kidney with decreased attenuation identified. Although this may simply represent hydronephrosis possibility of an underlying infiltrative mass deserves consideration. CT of the abdomen and  pelvis with contrast is recommended. 17 mm left thyroid nodule. This is roughly stable from the prior CT examination from 2019 and likely benign. Recommend thyroid US (ref: J Am Coll Radiol. 2015 Feb;12(2): 143-50). Electronically Signed   By: Inez Catalina M.D.   On: 05/11/2020 00:00   CT ABDOMEN PELVIS W CONTRAST  Result Date: 05/13/2020 CLINICAL DATA:  LEFT renal mass. Suspicion for renal mass on prior chest CT. EXAM: CT ABDOMEN AND PELVIS WITH CONTRAST TECHNIQUE: Multidetector CT imaging of the abdomen and pelvis was performed using the standard protocol following bolus administration of intravenous contrast. CONTRAST:  169mL OMNIPAQUE IOHEXOL 300 MG/ML  SOLN COMPARISON:  Chest CT of May 10, 2020 FINDINGS: Lower chest: Loculated bilateral basilar effusions with similar appearance when compared to the study of May 10, 2020. Basilar airspace disease with increasing airspace disease particularly at the LEFT lung base. Hiatal hernia and patulous distal esophagus. Calcified coronary artery disease partially imaged. No pericardial effusion. Hepatobiliary: Liver without focal, suspicious lesion. Portal vein is patent. No pericholecystic stranding. No biliary duct dilation. Pancreas: Pancreas is normal without peripancreatic stranding, ductal dilation or inflammation. Spleen: Spleen normal in size and contour. Adrenals/Urinary Tract: Adrenal glands are normal. Moderate to marked LEFT-sided hydroureteronephrosis with overlying cortical thinning. Large calculus in the proximal LEFT ureter measuring 9 x 8 x 8 mm. No perinephric abscess. Urinary bladder is normal. Normal enhancement of the RIGHT kidney. Enhancement of the kidneys despite scarring and dilation is relatively symmetric and there is an additional small calculus to moderate sized calculus in the lower pole the LEFT kidney. Area with limited assessment due to motion artifact. Stomach/Bowel: Hiatal hernia and some distal esophageal thickening as described.  Small duodenal diverticulum in the second portion of the duodenum. No acute gastrointestinal process. Appendix not visualized. Motion artifact in this area limiting assessment but no gross findings of inflammation that would indicate pericecal stranding or secondary signs of acute appendicitis. Colonic interposition along the anterior portion of the liver. No pericolonic stranding. No perirectal stranding. Vascular/Lymphatic: Calcific and noncalcific atheromatous plaque of the abdominal aorta. No aneurysmal dilation. No adenopathy in the retroperitoneum or upper abdomen. No pelvic lymphadenopathy. Reproductive: Heterogeneous prostate. Calcifications of the vas deferens. Other: No ascites. Musculoskeletal: No acute or destructive bone finding. Spinal degenerative changes. IMPRESSION: 1. Moderate to marked hydronephrosis due to calculus in the proximal to mid LEFT ureter. Urologic consultation and correlation with urine studies to exclude concomitant infection is suggested. 2. Cortical thinning that is seen on the LEFT and preservation of renal enhancement suggests that an element of this process could potentially be chronic. 3. Loculated effusions and worsening of LEFT lower lobe airspace disease that may represent a combination of volume loss and pneumonitis 4. Loculated bilateral basilar effusions with similar appearance when compared to the study of May 10, 2020. Basilar airspace disease with increasing airspace disease particularly at the LEFT lung base. Findings may represent worsening pneumonia or aspiration. 5. Hiatal hernia and patulous distal esophagus. 6. Aortic atherosclerosis. Aortic Atherosclerosis (ICD10-I70.0). Electronically Signed   By: Zetta Bills M.D.   On: 05/13/2020 12:52   DG Chest Port 1 View  Result Date: 05/29/2020 CLINICAL DATA:  Chest tube.  Shortness of breath. EXAM: PORTABLE CHEST 1 VIEW COMPARISON:  05/28/2020. FINDINGS: Two right chest tubes in stable position. Esophageal stent  in stable position. Stable cardiomegaly. Progressive dense right base atelectasis/infiltrate. Persistent right mid lung field infiltrate. Small right pleural effusion, improved from prior exam. No pneumothorax. IMPRESSION: 1. Two right  chest tubes in stable position. Esophageal stent stable position. No pneumothorax. 2.  Stable cardiomegaly. 3. Progressive dense right base atelectasis/infiltrate. Persistent right mid lung field infiltrate. 3.  Small right pleural effusion, improved from prior exam. Electronically Signed   By: Marcello Moores  Register   On: 05/29/2020 07:38   DG Chest Port 1 View  Result Date: 05/28/2020 CLINICAL DATA:  Pleural effusion, chest tube EXAM: PORTABLE CHEST 1 VIEW COMPARISON:  05/27/2020 FINDINGS: There are two right-sided chest tubes. Esophageal stent is again noted. Improved aeration of the right lung with persistent but decreased right pleural effusion. No pneumothorax. Cardiomediastinal contours are partially obscured IMPRESSION: Improved aeration of right lung since 05/27/2020 with persistent but decreased right pleural effusion. Electronically Signed   By: Macy Mis M.D.   On: 05/28/2020 09:53   DG Chest Port 1 View  Result Date: 05/27/2020 CLINICAL DATA:  Shortness of breath EXAM: PORTABLE CHEST 1 VIEW COMPARISON:  05/26/2020 FINDINGS: Cardiac shadow is stable. Esophageal stent is again noted and stable. Two right-sided chest tubes are seen in satisfactory position. The overall appearance is stable. Persistent opacification of the right hemithorax is again noted similar to that seen on recent plain film evaluation. No new focal abnormality is noted. IMPRESSION: Stable appearance of the chest with consolidation in the right hemithorax. Electronically Signed   By: Inez Catalina M.D.   On: 05/27/2020 07:22   DG Chest Port 1 View  Result Date: 05/25/2020 CLINICAL DATA:  Leak EXAM: PORTABLE CHEST 1 VIEW COMPARISON:  May 23, 2020 FINDINGS: The cardiomediastinal silhouette is  unchanged in contour.RIGHT-sided chest tubes. Moderate RIGHT pleural effusion. Esophageal stent projects over the mediastinum inferiorly. No pneumothorax. Persistent RIGHT-sided heterogeneous opacities are not significantly changed in comparison to prior. Minimal opacities of the LEFT lung. No acute osseous abnormality. IMPRESSION: Esophageal stent projects over the inferior mediastinum. Electronically Signed   By: Valentino Saxon MD   On: 05/25/2020 11:57   DG CHEST PORT 1 VIEW  Result Date: 05/22/2020 CLINICAL DATA:  60 year old male with perforated esophagus status post surgical exploration, esophageal stent placement and percutaneous gastrostomy tube placement postoperative day 5. Shortness of breath. EXAM: PORTABLE CHEST 1 VIEW COMPARISON:  Portable chest 0535 hours today and earlier. FINDINGS: Portable AP semi upright view at 2330 hours. Stable mediastinal contours. Esophageal stent configuration appears stable since 05/21/2020. But there is increasing confluent right lung base opacity. A single right right chest tube remains in place. No pneumothorax identified. The left lung remains clear allowing for portable technique. Visualized tracheal air column is within normal limits. IMPRESSION: 1. Increasing right lung base opacity since yesterday, indeterminate for pleural fluid versus consolidation. 2. Single right chest tube remains in place, no pneumothorax. 3. Esophageal stent configuration appears stable. Electronically Signed   By: Genevie Ann M.D.   On: 05/22/2020 23:39   DG CHEST PORT 1 VIEW  Result Date: 05/22/2020 CLINICAL DATA:  Follow-up pneumothorax EXAM: PORTABLE CHEST 1 VIEW COMPARISON:  05/21/2020 FINDINGS: Cardiac shadow is stable. Esophageal stent is again noted and stable. Right thoracostomy catheter is seen without evidence of pneumothorax. Right basilar atelectasis and small effusion are seen. The left lung is clear. IMPRESSION: No pneumothorax is noted. Small right pleural effusion and  basilar atelectasis. Electronically Signed   By: Inez Catalina M.D.   On: 05/22/2020 08:03   DG Chest Port 1 View  Result Date: 05/21/2020 CLINICAL DATA:  Postop from repair of esophageal rupture. EXAM: PORTABLE CHEST 1 VIEW COMPARISON:  05/20/2020 FINDINGS: An endoluminal  stent is seen within the thoracic esophagus. 2 right-sided chest tubes remain in place. Small approximately 10% right pneumothorax is noted, which appears slightly increased in size since previous study. Bibasilar atelectasis is again seen, right side greater than left, without significant change. Mild cardiomegaly remains stable. IMPRESSION: Small approximately 10% right pneumothorax, slightly increased in size since prior study. Stable bibasilar atelectasis, right side greater than left. Electronically Signed   By: Marlaine Hind M.D.   On: 05/21/2020 08:07   DG Chest Port 1 View  Result Date: 05/20/2020 CLINICAL DATA:  Shortness of breath. RIGHT-sided chest tube present. EXAM: PORTABLE CHEST 1 VIEW COMPARISON:  05/19/2020 FINDINGS: Patient has dual RIGHT-sided chest tubes, unchanged in position. There is no pneumothorax. There is persistent RIGHT pleural effusion and significant opacity at the RIGHT lung base which partially obscures the hemidiaphragm. Subsegmental atelectasis at the LEFT lung base. Stable appearance of esophageal stent. IMPRESSION: 1. Stable appearance of the chest. 2. No pneumothorax. 3. Persistent RIGHT basilar opacity and pleural effusion. Electronically Signed   By: Nolon Nations M.D.   On: 05/20/2020 09:51   DG CHEST PORT 1 VIEW  Result Date: 05/19/2020 CLINICAL DATA:  Empyema EXAM: PORTABLE CHEST 1 VIEW COMPARISON:  05/18/2020 FINDINGS: Two right apical chest tubes.  No pneumothorax is seen. Patchy right mid/lower lung opacities, reflecting a combination of pneumonia and known complex right pleural fluid collection/empyema. Mild patchy opacity in the left mid/lower lung, likely reflecting pneumonia. Stable  esophageal stent. Cardiomegaly. IMPRESSION: Two right apical chest tubes.  No pneumothorax is seen. Multifocal pneumonia, grossly unchanged. Superimposed complex right pleural fluid collection/empyema. Esophageal stent. Electronically Signed   By: Julian Hy M.D.   On: 05/19/2020 07:12   DG CHEST PORT 1 VIEW  Result Date: 05/18/2020 CLINICAL DATA:  Esophageal perforation. EXAM: PORTABLE CHEST 1 VIEW COMPARISON:  05/17/2020. FINDINGS: Esophageal stent stable position. Two right chest tubes in stable position. Stable cardiomegaly. No pulmonary venous congestion. Low lung volumes with bibasilar atelectasis. Bilateral pulmonary infiltrates/edema again noted. Interim slight increase in right-sided pleural effusion. No pneumothorax noted on today's exam. IMPRESSION: 1. Esophageal stent and 2 right chest tubes noted in stable position. No pneumothorax noted on today's exam. Interim slight increase in right pleural effusion. 2. Low lung volumes with bibasilar atelectasis. Persistent bilateral pulmonary infiltrates/edema without interim change. 3.  Stable cardiomegaly.  No pulmonary venous congestion Electronically Signed   By: Marcello Moores  Register   On: 05/18/2020 05:41   DG Chest Port 1 View  Result Date: 05/17/2020 CLINICAL DATA:  Esophageal stent placement. EXAM: PORTABLE CHEST 1 VIEW COMPARISON:  Chest CT yesterday. FINDINGS: Interval placement of esophageal stent. Two right chest tubes in place. Decreased right hydropneumothorax. Persistent pleural fluid in extrapleural air at the right lung base. Patchy opacity in the right mid lower lung zone. Small left pleural effusion and basilar opacities. Heart size grossly stable. There is contrast within the stomach. IMPRESSION: 1. Interval placement of esophageal stent. 2. Two right chest tubes in place with decreased right hydropneumothorax. Small volume of residual pleural fluid in extrapleural air at the right lung base. 3. Patchy opacity in the right mid and  lower lung zone, may represent atelectasis or re-expansion pulmonary edema. 4. Small left pleural effusion and basilar opacities. Electronically Signed   By: Keith Rake M.D.   On: 05/17/2020 19:57   DG CHEST PORT 1 VIEW  Result Date: 05/16/2020 CLINICAL DATA:  Chest tube insertion for pneumothorax. EXAM: PORTABLE CHEST 1 VIEW COMPARISON:  May 16, 2020 FINDINGS: A right-sided chest tube is noted. Its distal end is kinked and is seen overlying the lateral aspect of the right upper quadrant. A large, stable right-sided hydropneumothorax is seen with subsequent collapse of the right lung. This is unchanged in appearance when compared to the prior study. Stable mild to moderate severity atelectasis and/or infiltrate is noted within the left lung base. The heart size and mediastinal contours are within normal limits. The visualized skeletal structures are unremarkable. IMPRESSION: 1. Stable large right-sided hydropneumothorax with subsequent collapse of the right lung. 2. Stable mild to moderate severity left basilar atelectasis and/or infiltrate. 3. Right-sided chest tube with its distal end kinked and seen overlying the right upper quadrant. Electronically Signed   By: Virgina Norfolk M.D.   On: 05/16/2020 16:52   DG CHEST PORT 1 VIEW  Result Date: 05/16/2020 CLINICAL DATA:  Pneumothorax post recent thoracentesis, shortness of breath EXAM: PORTABLE CHEST 1 VIEW COMPARISON:  Portable exam 0849 hours compared to 05/14/2020 FINDINGS: Stable heart size and mediastinal contours. RIGHT hydropneumothorax, with increased collapse of RIGHT lung since previous exam. Minimal LEFT pleural effusion with atelectasis versus infiltrate LEFT lower lobe. LEFT upper lung clear. Bones demineralized. IMPRESSION: RIGHT hydropneumothorax with increased collapse of RIGHT lung since previous exam. Minimal LEFT pleural effusion with atelectasis versus infiltrate at LEFT lower lobe. Findings called to Dr. Elsworth Soho on 05/16/2020 at  0922 hours. Electronically Signed   By: Lavonia Dana M.D.   On: 05/16/2020 09:38   DG Chest Port 1 View  Result Date: 05/10/2020 CLINICAL DATA:  Questionable sepsis.  Pneumonia diagnosis yesterday. EXAM: PORTABLE CHEST 1 VIEW COMPARISON:  Radiograph yesterday. FINDINGS: Upper normal heart size. Unchanged mediastinal contours. Hazy symmetric bibasilar opacities are unchanged from radiograph yesterday. No pulmonary edema. No pneumothorax. IMPRESSION: Unchanged symmetric bibasilar opacities are unchanged from yesterday, likely combination of pleural effusions and atelectasis/pneumonia. Electronically Signed   By: Keith Rake M.D.   On: 05/10/2020 21:24   DG Chest Port 1 View  Result Date: 05/09/2020 CLINICAL DATA:  Cough and fever. EXAM: PORTABLE CHEST 1 VIEW COMPARISON:  09/06/2018 FINDINGS: Heart size is normal. There are bilateral pleural effusions and infiltrate/atelectasis in both lower lungs, consistent with pneumonia. Upper lungs are clear. IMPRESSION: Bilateral lower lobe atelectasis and pneumonia.  Small effusions. Electronically Signed   By: Nelson Chimes M.D.   On: 05/09/2020 14:16   DG Chest Port 1V same Day  Result Date: 05/14/2020 CLINICAL DATA:  Follow-up right-sided pneumothorax. EXAM: PORTABLE CHEST 1 VIEW COMPARISON:  May 14, 2020 (3:02 p.m.) FINDINGS: Stable moderate to marked severity atelectasis and/or infiltrate is noted within the right lung base with stable mild to moderate severity left basilar atelectasis and/or infiltrate seen. There is a small, stable left pleural effusion with a stable moderate sized right pleural effusion. A stable moderate sized right apical pneumothorax is seen. A mild amount of radiopaque contrast is again seen along the infrahilar region on the right. The heart size and mediastinal contours are within normal limits. The visualized skeletal structures are unremarkable. IMPRESSION: No significant interval change when compared to the prior study dated  May 14, 2020 (3:02 p.m.) Electronically Signed   By: Virgina Norfolk M.D.   On: 05/14/2020 19:27   DG Swallowing Func-Speech Pathology  Result Date: 05/14/2020 Objective Swallowing Evaluation: Type of Study: MBS-Modified Barium Swallow Study  Patient Details Name: Evan Chavez MRN: [id number] Date of Birth: 1961/07/16 Today's Date: 05/14/2020 Time: SLP Start Time (ACUTE ONLY): 0973 -SLP Stop Time (  ACUTE ONLY): 1300 SLP Time Calculation (min) (ACUTE ONLY): 30 min Past Medical History: Past Medical History: Diagnosis Date . Cocaine abuse ([place])  . ETOH abuse  . GERD (gastroesophageal reflux disease)  . Hyperlipidemia  . 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 BIOPSY  08/25/2018  Procedure: BIOPSY;  Surgeon: [name], MD;  [facility]: [facility];  Service: Endoscopy;; . BIOPSY  12/13/2018  Procedure: BIOPSY;  Surgeon: [name], MD;  Location: [facility];  Service: Endoscopy;;  esophagus . COLONOSCOPY WITH PROPOFOL N/A 04/07/2019  Dr. [name]: 3 Tubular adenomas removed (4 to 8 mm in size), next colonoscopy 3 years. . ESOPHAGOGASTRODUODENOSCOPY (EGD) WITH PROPOFOL N/A 08/25/2018  Dr. [name]: Severely inflamed abnormal appearing mid/distal esophagus.  Encroachment on lumen suspicious for infiltrating neoplasm located be all from severe benign inflammation, extensive gastric erosions and extensive duodenal ulcerations.  Query ischemic process versus other.  Biopsy revealed Barrett's esophagus but negative for malignancy and H. pylori. . ESOPHAGOGASTRODUODENOSCOPY (EGD) WITH PROPOFOL N/A 12/13/2018  Dr. [name]: peptic stricture s/p balloon dilatation. abnormal esophageal mucosa, by c/w barrett's without dysplasia, previous PUD completely healed.  Marland Kitchen POLYPECTOMY  04/07/2019  Procedure: POLYPECTOMY;  Surgeon: [name], MD;  Location: [facility];  Service: Endoscopy;;  colon HPI: 59 y.o. male with medical history  significant for prior stroke, hyperlipidemia, GERD, BPH, barrett's esophagus    and remote history of cocaine abuse who presents to the emergency department due to 5 days of weakness, complains of productive cough with clear phlegm with occasional black speckles with associated reproducible midsternal chest pain. Patient was seen in the ED 2 days ago (7/28) due to similar symptoms, during which he reported a max temperature of 100F.  Chest x-ray done at that time showed pneumonia and patient was treated with IV fluid.  Amoxicillin and doxycycline were prescribed on the was advised to follow-up with his primary care physician.  Patient returned to the ED yesterday due to increased heart rate and sob.  He states that he was taking his blood pressure medication as prescribed and denies cocaine use. Bilateral pleural effusions and lower lobe consolidation similar to that seen on recent chest x-ray. This is consistent with bibasilar pneumonia and reactive effusions. Diffuse wall thickening throughout the esophagus likely related to reflux esophagitis. Clinical correlation is recommended. It should be noted this is stable from a prior exam of 2019.  Subjective: "My chest hurts when I am coughing sometimes." Assessment / Plan / Recommendation CHL IP CLINICAL IMPRESSIONS 05/14/2020 Clinical Impression Pt presents with mild oropharyngeal phase dysphagia characterized by lingual pumping with reduced bolus cohesiveness and premature spillage of liquids to the valleculae. Hyolaryngeal excursion appeared WNL. Pt without penetration/aspiration across consistencies/textures and only with trace amounts of thin liquid residuals along base of tongue and in lateral channels which cleared with secondary swallow. Pt is currently on 4L O2 via nasal cannula. Risk for aspiration with po intake appears minimal, however his respiratory status slightly increases his risk. Pt also with history of esophageal dysphagia. Recommend regular textures  and thin liquids with standard aspiration and reflux precautions. No further SLP services indicated at this time. This study was reviewed with Dr. Elsworth Soho (pulmonary).  SLP Visit Diagnosis Dysphagia, oropharyngeal phase (R13.12) Attention and concentration deficit following -- Frontal lobe and executive function deficit following -- Impact on safety and function Mild aspiration risk   CHL IP TREATMENT RECOMMENDATION 05/14/2020 Treatment Recommendations No  treatment recommended at this time   Prognosis 05/14/2020 Prognosis for Safe Diet Advancement Good Barriers to Reach Goals -- Barriers/Prognosis Comment -- CHL IP DIET RECOMMENDATION 05/14/2020 SLP Diet Recommendations Regular solids;Thin liquid Liquid Administration via Cup;Straw Medication Administration Whole meds with liquid Compensations Slow rate;Small sips/bites Postural Changes Seated upright at 90 degrees;Remain semi-upright after after feeds/meals (Comment)   CHL IP OTHER RECOMMENDATIONS 05/14/2020 Recommended Consults -- Oral Care Recommendations Oral care BID Other Recommendations Clarify dietary restrictions   CHL IP FOLLOW UP RECOMMENDATIONS 05/14/2020 Follow up Recommendations None   CHL IP FREQUENCY AND DURATION 05/14/2020 Speech Therapy Frequency (ACUTE ONLY) min 2x/week Treatment Duration 1 week      CHL IP ORAL PHASE 05/14/2020 Oral Phase Impaired Oral - Pudding Teaspoon -- Oral - Pudding Cup -- Oral - Honey Teaspoon -- Oral - Honey Cup -- Oral - Nectar Teaspoon -- Oral - Nectar Cup -- Oral - Nectar Straw -- Oral - Thin Teaspoon Premature spillage Oral - Thin Cup Premature spillage;Decreased bolus cohesion Oral - Thin Straw Premature spillage Oral - Puree Lingual pumping Oral - Mech Soft -- Oral - Regular Delayed oral transit Oral - Multi-Consistency -- Oral - Pill -- Oral Phase - Comment --  CHL IP PHARYNGEAL PHASE 05/14/2020 Pharyngeal Phase Impaired Pharyngeal- Pudding Teaspoon -- Pharyngeal -- Pharyngeal- Pudding Cup -- Pharyngeal -- Pharyngeal- Honey Teaspoon  -- Pharyngeal -- Pharyngeal- Honey Cup -- Pharyngeal -- Pharyngeal- Nectar Teaspoon -- Pharyngeal -- Pharyngeal- Nectar Cup -- Pharyngeal -- Pharyngeal- Nectar Straw -- Pharyngeal -- Pharyngeal- Thin Teaspoon Delayed swallow initiation-vallecula Pharyngeal -- Pharyngeal- Thin Cup Delayed swallow initiation-vallecula;Lateral channel residue Pharyngeal -- Pharyngeal- Thin Straw Lateral channel residue;Delayed swallow initiation-vallecula Pharyngeal -- Pharyngeal- Puree Delayed swallow initiation-vallecula;WFL Pharyngeal -- Pharyngeal- Mechanical Soft -- Pharyngeal -- Pharyngeal- Regular WFL Pharyngeal -- Pharyngeal- Multi-consistency -- Pharyngeal -- Pharyngeal- Pill WFL Pharyngeal -- Pharyngeal Comment --  CHL IP CERVICAL ESOPHAGEAL PHASE 05/14/2020 Cervical Esophageal Phase WFL Pudding Teaspoon -- Pudding Cup -- Honey Teaspoon -- Honey Cup -- Nectar Teaspoon -- Nectar Cup -- Nectar Straw -- Thin Teaspoon -- Thin Cup -- Thin Straw -- Puree -- Mechanical Soft -- Regular -- Multi-consistency -- Pill -- Cervical Esophageal Comment -- Thank you, Genene Churn, [place] PORTER,DABNEY 05/14/2020, 2:21 PM              DG C-Arm 1-60 Min  Result Date: 05/25/2020 CLINICAL DATA:  Esophageal stent placement. EXAM: DG C-ARM 1-60 MIN; CHEST  1 VIEW FLUOROSCOPY TIME:  Fluoro time: 4 Minutes 17 secondsRadiation exposure index: MGy: 41.69Number of acquired spot images: Five spot fluoro graphic images. COMPARISON:  May 17, 2020.  May 23, 2020 FINDINGS: Spot fluoroscopy images were obtained for surgical planning purposes. Esophageal stent is seen traversing the thorax. Enteric contrast traverses the stent and enters the proximal stomach. None of the provided radiographs demonstrate evaluation of the site of previous leak into the RIGHT pleural space. Selective LEFT mainstem intubation. IMPRESSION: Spot fluoroscopy images from surgical planning purposes. Please reference procedure report for further details.  Electronically Signed   By: Valentino Saxon MD   On: 05/25/2020 10:59   DG C-Arm 1-60 Min  Result Date: 05/17/2020 CLINICAL DATA:  Surgery. Esophageal stent placement. EXAM: DG C-ARM 1-60 MIN; CHEST  1 VIEW FLUOROSCOPY TIME:  Fluoroscopy Time:  5 minutes 48 seconds Radiation Exposure Index (if provided by the fluoroscopic device): 59.06 mGy Number of Acquired Spot Images: 3 COMPARISON:  Preprocedural CT yesterday. FINDINGS: Three fluoroscopic spot views obtained in the operating room.  Endoscope is in place. A stent overlies the endoscope with tip projecting over the esophageal diaphragmatic hiatus. Subsequent enteric tube courses in the stent. There is contrast opacifying the distal stent on image 1. Right chest tube is in place. IMPRESSION: Intraoperative fluoroscopy during esophageal stent placement. Please reference procedural report for details. Electronically Signed   By: Keith Rake M.D.   On: 05/17/2020 19:55   ECHOCARDIOGRAM COMPLETE  Result Date: 05/11/2020    ECHOCARDIOGRAM REPORT   Patient Name:   Evan Chavez Ellers Date of Exam: 05/11/2020 Medical Rec #:  [id number]           Height:       69.0 in Accession #:    [id number]          Weight:       153.0 lb Date of Birth:  1961/09/21           BSA:          1.844 m Patient Age:    21 years            BP:           141/90 mmHg Patient Gender: M                   HR:           100 bpm. Exam Location:  [facility] Procedure: 2D Echo, Cardiac Doppler and Color Doppler Indications:    CHF  History:        Patient has prior history of Echocardiogram examinations, most                 recent 07/13/2018. CHF, Stroke, Signs/Symptoms:Shortness of                 Breath; Risk Factors:Hypertension and Dyslipidemia. Cocaine                 abuse, Pneumonia.  Sonographer:    Dustin Flock RDCS Referring Phys: [id number] [place]  1. Left ventricular ejection fraction, by estimation, is 65 to 70%. The left ventricle has normal function. The  left ventricle has no regional wall motion abnormalities. Left ventricular diastolic parameters are consistent with Grade I diastolic dysfunction (impaired relaxation).  2. Right ventricular systolic function is normal. The right ventricular size is normal. Tricuspid regurgitation signal is inadequate for assessing PA pressure.  3. The mitral valve is grossly normal. Trivial mitral valve regurgitation.  4. The aortic valve is tricuspid. Aortic valve regurgitation is not visualized.  5. The inferior vena cava is normal in size with greater than 50% respiratory variability, suggesting right atrial pressure of 3 mmHg. FINDINGS  Left Ventricle: Left ventricular ejection fraction, by estimation, is 65 to 70%. The left ventricle has normal function. The left ventricle has no regional wall motion abnormalities. The left ventricular internal cavity size was normal in size. There is  no left ventricular hypertrophy. Left ventricular diastolic parameters are consistent with Grade I diastolic dysfunction (impaired relaxation). Right Ventricle: The right ventricular size is normal. No increase in right ventricular wall thickness. Right ventricular systolic function is normal. Tricuspid regurgitation signal is inadequate for assessing PA pressure. Left Atrium: Left atrial size was normal in size. Right Atrium: Right atrial size was normal in size. Pericardium: There is no evidence of pericardial effusion. Mitral Valve: The mitral valve is grossly normal. Trivial mitral valve regurgitation. Tricuspid Valve: The tricuspid valve is grossly normal. Tricuspid valve regurgitation is mild. Aortic Valve: The aortic  valve is tricuspid. Aortic valve regurgitation is not visualized. Pulmonic Valve: The pulmonic valve was grossly normal. Pulmonic valve regurgitation is trivial. Aorta: The aortic root is normal in size and structure. Venous: The inferior vena cava is normal in size with greater than 50% respiratory variability, suggesting  right atrial pressure of 3 mmHg. IAS/Shunts: No atrial level shunt detected by color flow Doppler.  LEFT VENTRICLE PLAX 2D LVIDd:         4.30 cm  Diastology LVIDs:         2.68 cm  LV e' lateral:   6.42 cm/s LV PW:         0.82 cm  LV E/e' lateral: 11.1 LV IVS:        0.98 cm  LV e' medial:    5.11 cm/s LVOT diam:     2.20 cm  LV E/e' medial:  13.9 LV SV:         81 LV SV Index:   44 LVOT Area:     3.80 cm  RIGHT VENTRICLE RV Basal diam:  2.99 cm RV S prime:     21.60 cm/s TAPSE (M-mode): 3.1 cm LEFT ATRIUM             Index       RIGHT ATRIUM           Index LA diam:        3.50 cm 1.90 cm/m  RA Area:     14.20 cm LA Vol (A2C):   42.8 ml 23.21 ml/m RA Volume:   31.20 ml  16.92 ml/m LA Vol (A4C):   46.7 ml 25.33 ml/m LA Biplane Vol: 45.6 ml 24.73 ml/m  AORTIC VALVE LVOT Vmax:   124.00 cm/s LVOT Vmean:  72.900 cm/s LVOT VTI:    0.212 m  AORTA Ao Root diam: 3.00 cm MITRAL VALVE MV Area (PHT): 6.65 cm     SHUNTS MV Decel Time: 114 msec     Systemic VTI:  0.21 m MV E velocity: 71.10 cm/s   Systemic Diam: 2.20 cm MV A velocity: 113.00 cm/s MV E/A ratio:  0.63 Rozann Lesches MD Electronically signed by Rozann Lesches MD Signature Date/Time: 05/11/2020/4:35:39 PM    Final    DG ESOPHAGUS W SINGLE CM (SOL OR THIN BA)  Result Date: 05/28/2020 CLINICAL DATA:  Esophageal stent with prior leak, reassessment for leak EXAM: ESOPHOGRAM/BARIUM SWALLOW TECHNIQUE: Single contrast examination was performed using 25 cc Omnipaque 300. FLUOROSCOPY TIME:  Fluoroscopy Time:  3 minutes, 24 seconds Radiation Exposure Index (if provided by the fluoroscopic device): 33 mGy Number of Acquired Spot Images: 0 COMPARISON:  Multiple exams, including 05/23/2020 FINDINGS: Mid to distal expandable esophageal stent noted. The prior right-sided leak from the distal margin of the stent is no longer present. Contrast medium extending around the distal margin of the stent primarily on the left side appears to be intraluminal and likely in  between the stent and the wall of the distal esophagus or small hiatal hernia based on the contained appearance for example on image 26/8. This collection was noted to extend proximally along the distal half of the stent along the left side. This is not unexpected given the appearance on the chest CT of 05/24/2020, which showed some luminal gas tracking along the left side of the stent in this vicinity. Accordingly, no leak from the esophagus is currently identified. IMPRESSION: 1. No esophageal leak is currently identified. 2. A small amount of intraluminal contrast extends around the  stent distally, but this is felt to be due to incomplete apposition of the stent with the lumen, rather than a leak. Electronically Signed   By: Van Clines M.D.   On: 05/28/2020 08:29   DG ESOPHAGUS W SINGLE CM (SOL OR THIN BA)  Result Date: 05/23/2020 CLINICAL DATA:  Postop esophageal perforation 05/17/2020. EXAM: ESOPHOGRAM/BARIUM SWALLOW TECHNIQUE: Single contrast examination was performed using  water-soluble. FLUOROSCOPY TIME:  Fluoroscopy Time:  1 minutes 36 seconds Radiation Exposure Index (if provided by the fluoroscopic device): Number of Acquired Spot Images: 5 COMPARISON:  CT chest 05/16/2020. FINDINGS: In the recumbent LPO position, patient drank water soluble Omnipaque 300. There is a long esophageal wall stent centered in the mid esophagus. Leakage of contrast from the right posterolateral wall of the distal esophagus, in the region of the distal aspect of the wall stent, is seen with filling of the right pleural space. IMPRESSION: Persistent leak from the right posterolateral aspect of the esophagus, along the distal aspect of the stent, with filling of the adjacent right pleural space. Electronically Signed   By: Lorin Picket M.D.   On: 05/23/2020 10:05   US THORACENTESIS ASP PLEURAL SPACE W/IMG GUIDE  Result Date: 05/14/2020 INDICATION: Complicated RIGHT pleural effusion EXAM: ULTRASOUND GUIDED  DIAGNOSTIC RIGHT THORACENTESIS MEDICATIONS: None. COMPLICATIONS: Probable RIGHT pneumothorax, see chest radiograph report PROCEDURE: An ultrasound guided thoracentesis was thoroughly discussed with the patient and questions answered. The benefits, risks, alternatives and complications were also discussed. The patient understands and wishes to proceed with the procedure. Written consent was obtained. Ultrasound was performed to localize and mark an adequate pocket of fluid in the posterior RIGHT chest. The area was then prepped and draped in the normal sterile fashion. 1% Lidocaine was used for local anesthesia. Under ultrasound guidance a 5 [place] Yueh catheter was introduced. Thoracentesis was performed. The catheter was removed and a dressing applied. FINDINGS: A total of approximately 30 mL of cloudy yellow slightly thicker RIGHT pleural fluid was removed. Samples were sent to the laboratory as requested by the clinical team. IMPRESSION: Successful ultrasound guided diagnostic RIGHT thoracentesis yielding 30 mL of cloudy yellow RIGHT pleural fluid, cannot exclude infection/empyema with this appearance. Findings discussed with Dr. Elsworth Soho on 05/14/2020 at 1545 hours. Electronically Signed   By: Lavonia Dana M.D.   On: 05/14/2020 15:50     Time Spent in minutes  30     Desiree Hane M.D on 05/30/2020 at 6:12 PM  To page go to [email or web address] - password [facility]

## 2020-05-31 DIAGNOSIS — R000 Tachycardia, unspecified: Secondary | ICD-10-CM | POA: Diagnosis present

## 2020-05-31 DIAGNOSIS — J9811 Atelectasis: Secondary | ICD-10-CM

## 2020-05-31 LAB — GLUCOSE, CAPILLARY
Glucose-Capillary: 116 mg/dL — ABNORMAL HIGH (ref 70–99)
Glucose-Capillary: 122 mg/dL — ABNORMAL HIGH (ref 70–99)
Glucose-Capillary: 133 mg/dL — ABNORMAL HIGH (ref 70–99)
Glucose-Capillary: 139 mg/dL — ABNORMAL HIGH (ref 70–99)
Glucose-Capillary: 142 mg/dL — ABNORMAL HIGH (ref 70–99)
Glucose-Capillary: 146 mg/dL — ABNORMAL HIGH (ref 70–99)
Glucose-Capillary: 149 mg/dL — ABNORMAL HIGH (ref 70–99)
Glucose-Capillary: 153 mg/dL — ABNORMAL HIGH (ref 70–99)

## 2020-05-31 LAB — CBC WITH DIFFERENTIAL/PLATELET
Abs Immature Granulocytes: 0.12 10*3/uL — ABNORMAL HIGH (ref 0.00–0.07)
Basophils Absolute: 0.1 10*3/uL (ref 0.0–0.1)
Basophils Relative: 1 %
Eosinophils Absolute: 0.1 10*3/uL (ref 0.0–0.5)
Eosinophils Relative: 1 %
HCT: 30.8 % — ABNORMAL LOW (ref 39.0–52.0)
Hemoglobin: 9.4 g/dL — ABNORMAL LOW (ref 13.0–17.0)
Immature Granulocytes: 1 %
Lymphocytes Relative: 6 %
Lymphs Abs: 0.9 10*3/uL (ref 0.7–4.0)
MCH: 26.3 pg (ref 26.0–34.0)
MCHC: 30.5 g/dL (ref 30.0–36.0)
MCV: 86.3 fL (ref 80.0–100.0)
Monocytes Absolute: 0.8 10*3/uL (ref 0.1–1.0)
Monocytes Relative: 6 %
Neutro Abs: 12.6 10*3/uL — ABNORMAL HIGH (ref 1.7–7.7)
Neutrophils Relative %: 85 %
Platelets: 704 10*3/uL — ABNORMAL HIGH (ref 150–400)
RBC: 3.57 MIL/uL — ABNORMAL LOW (ref 4.22–5.81)
RDW: 14.8 % (ref 11.5–15.5)
WBC: 14.6 10*3/uL — ABNORMAL HIGH (ref 4.0–10.5)
nRBC: 0.1 % (ref 0.0–0.2)

## 2020-05-31 LAB — COMPREHENSIVE METABOLIC PANEL
ALT: 198 U/L — ABNORMAL HIGH (ref 0–44)
AST: 67 U/L — ABNORMAL HIGH (ref 15–41)
Albumin: 2 g/dL — ABNORMAL LOW (ref 3.5–5.0)
Alkaline Phosphatase: 250 U/L — ABNORMAL HIGH (ref 38–126)
Anion gap: 9 (ref 5–15)
BUN: 11 mg/dL (ref 6–20)
CO2: 24 mmol/L (ref 22–32)
Calcium: 8.8 mg/dL — ABNORMAL LOW (ref 8.9–10.3)
Chloride: 100 mmol/L (ref 98–111)
Creatinine, Ser: 0.78 mg/dL (ref 0.61–1.24)
GFR calc Af Amer: >60 mL/min (ref >60)
GFR calc non Af Amer: >60 mL/min (ref >60)
Glucose, Bld: 140 mg/dL — ABNORMAL HIGH (ref 70–99)
Potassium: 5 mmol/L (ref 3.5–5.1)
Sodium: 133 mmol/L — ABNORMAL LOW (ref 135–145)
Total Bilirubin: 0.3 mg/dL (ref 0.3–1.2)
Total Protein: 7.4 g/dL (ref 6.5–8.1)

## 2020-05-31 LAB — HEPATITIS PANEL, ACUTE
HCV Ab: NONREACTIVE
Hep A IgM: NONREACTIVE
Hep B C IgM: NONREACTIVE
Hepatitis B Surface Ag: NONREACTIVE

## 2020-05-31 MED ORDER — BOOST / RESOURCE BREEZE PO LIQD CUSTOM
1.0000 | Freq: Two times a day (BID) | ORAL | Status: DC
  Administered 2020-05-31 – 2020-06-08 (×10): 1 via ORAL

## 2020-05-31 NOTE — TOC Progression Note (Signed)
Transition of Care [facility]) - Progression Note    Patient Details  Name: Laurance Heide MRN: [id number] Date of Birth: 03-Feb-1961  Transition of Care [facility]) CM/SW [place], [place] Phone Number: 05/31/2020, 3:42 PM  Clinical Narrative:     CSW called pts mother, Peter Congo. Peter Congo stated she was on her way to the hospital and will talk to [place] in person.   CSW met with pt and [facility] bedside. CSW explained that PT of recommending CIR. CSW explained to pt that his insurances is a barrier to SNF. Pt and mother agreed that they would prefer CIR if possible. Pt and mother express understanding in insurance being a barrier for SNF. Pt and mother are not interested in long term care at this point.   Pt does not have any SNF bed offers at this time.   Expected Discharge Plan: [place] Barriers to Discharge: Inadequate or no insurance, Ship broker, Continued Medical Work up  [employer] Expected Discharge Plan: [place] arrangements for the past 2 months: Single Family Home                                       Social Determinants of Health (SDOH) Interventions    Readmission Risk Interventions No flowsheet data found.  Emeterio Reeve, Latanya Presser, [place] Social Worker [phone]

## 2020-05-31 NOTE — Plan of Care (Signed)
  Problem: Education: Goal: Knowledge of General Education information will improve Description: Including pain rating scale, medication(s)/side effects and non-pharmacologic comfort measures Outcome: Progressing   Problem: Health Behavior/Discharge Planning: Goal: Ability to manage health-related needs will improve Outcome: Progressing   Problem: Clinical Measurements: Goal: Ability to maintain clinical measurements within normal limits will improve Outcome: Progressing   Problem: Clinical Measurements: Goal: Will remain free from infection Outcome: Progressing   Problem: Clinical Measurements: Goal: Diagnostic test results will improve Outcome: Progressing   Problem: Clinical Measurements: Goal: Respiratory complications will improve Outcome: Progressing   Problem: Activity: Goal: Risk for activity intolerance will decrease Outcome: Progressing   Problem: Nutrition: Goal: Adequate nutrition will be maintained Outcome: Progressing   Problem: Coping: Goal: Level of anxiety will decrease Outcome: Progressing   Problem: Pain Managment: Goal: General experience of comfort will improve Outcome: Progressing   Problem: Skin Integrity: Goal: Risk for impaired skin integrity will decrease Outcome: Progressing   

## 2020-05-31 NOTE — Progress Notes (Addendum)
Nutrition Follow-up   RD working remotely.  DOCUMENTATION CODES:   Non-severe (moderate) malnutrition in context of chronic illness  INTERVENTION:   - Boost Breeze po BID, each supplement provides 250 kcal and 9 grams of protein  Continue tube feeding via PEG: - Osmolite 1.5 @ goal rate of 55 ml/hr (1320 ml/day) - ProSource TF 45 ml BID  Tube feeding regimenprovides2060kcal, 105grams of protein, and 1090m of H2O.  NUTRITION DIAGNOSIS:   Moderate Malnutrition related to chronic illness (Barrett's esophagus, GERD) as evidenced by moderate fat depletion, moderate muscle depletion.  Ongoing  GOAL:   Patient will meet greater than or equal to 90% of their needs  Met via TF  MONITOR:   Diet advancement, Labs, Weight trends, TF tolerance, Skin, I & O's  REASON FOR ASSESSMENT:   Consult Enteral/tube feeding initiation and management  ASSESSMENT:   59year old male who presented on 7/28 with fever, SOB. PMH of stroke, HLD, GERD, Barrett's esophagus, PUD, s/p balloon stricturoplasty for GE junction in 2020. Admitted with severe sepsis secondary to bilateral pleural effusions with concomitant bibasilar pneumonia.  8/02 - MBSS with recommendation for regular diet with thin liquids, s/p thoracentesis 8/04 - s/p chest tube insertion, chest CT showing esophageal perforation 8/05 - s/p VATS, esophageal stent placement, PEG tube placement 8/13 - s/p right thoracoscopy, drainage of loculated pleural effusion, esophagogastroscopy, esophageal stent placement, esophagram with Omnipaque 8/16 - swallow study showing no leak 8/19 - diet advanced to clear liquids  Noted plan for pt to d/c to SNF. Diet advanced to clear liquids this morning per TCTS. No meal completions documented at this time. RD will order Boost Breeze supplements while pt on clear liquid diet. Plan to continue TF at this time.  Reviewed weights. Weight was stable from 8/4 to 8/15 then dropped from 140 lbs to 131  lbs on 8/16. Current weight is 132.9 lbs. Will continue to monitor trends and adjust TF regimen as appropriate. Will hold off on increasing TF today given diet advancement and addition of Boost Breeze supplement.  Per RN edema assessment, pt with generalized edema.  Current TF: Osmolite 1.5 @ 55 ml/hr, ProSource TF 45 ml BID  Medications reviewed and include: dulcolax, SSI, protonix, senokot, fluconazole  Labs reviewed: sodium 133, elevated LFTs, hemoglobin 9.4 CBG's: 115-149 x 24 hours  UOP: 600 ml x 24 hours Right JP drain: 10 ml x 24 hours CT: 60 ml x 24 hours I/O's: +2.4 L since admit  Diet Order:   Diet Order            Diet clear liquid Room service appropriate? Yes; Fluid consistency: Thin  Diet effective now                 EDUCATION NEEDS:   Education needs have been addressed  Skin:  Skin Assessment: Skin Integrity Issues: Stage II: R buttocks Incisions: right chest Other: skin tear to L buttocks  Last BM:  05/30/20  Height:   Ht Readings from Last 1 Encounters:  05/17/20 '5\' 9"'  (1.753 m)    Weight:   Wt Readings from Last 1 Encounters:  05/31/20 60.3 kg    Ideal Body Weight:  72.7 kg  BMI:  Body mass index is 19.63 kg/m.  Estimated Nutritional Needs:   Kcal:  2000-2200  Protein:  100-115 grams  Fluid:  >/= 2.0 L    KGaynell Face MS, RD, LDN Inpatient Clinical Dietitian Please see AMiON for contact information.

## 2020-05-31 NOTE — Progress Notes (Signed)
[facility]  PROGRESS NOTE  Evan Chavez URK:[id number] DOB: Oct 31, 1960 DOA: 05/10/2020 PCP: Rosita Fire, MD Admit date - 05/10/2020   Admitting Physician Lajuana Matte, MD  Outpatient Primary MD for the patient is Rosita Fire, MD  LOS - 20 Brief Narrative   Evan Chavez is a 59 y.o. year old male with medical history significant for GERD, Barrett's esophagus, peptic ulcer disease with extensive gastric erosion, duodenal ulcerand esophageal stricture status post balloon dilatation on 12/2018, also with history of prior stroke, hyperlipidemia, BPH, remote to cocaine abuse. who presented on 05/10/2020 with generalized weakness, productive cough and low-grade fever shortness of breath initially admitted for pneumonia based off CTA chest showing bibasilar consolidation pleural effusions treated with ceftriaxone and azithromycin. Hospital course complicated by right-sided hydropneumothorax with possible lung collapse after right thoracentesis on 8/2, esophageal perforation (CT chest on 8/4)With large right-sided hydropneumothorax, complicated by aspiration pneumonia.  Patient underwent right-sided chest tube placement was transferred from [facility] to [facility] where he underwent VATS and decortication by CT surgery as well as esophageal stent placement and PEG tube placement.  Patient has been managed on hospitalist service since.  Did have episodes of persistent leakage from esophagus on esophagram as well as requiring right thorascopic for drainage of loculated pleural effusion on 8/13, repeat esophagram on 8/16 did not show any esophageal leak. Currently hospitalist service is following as a consult and CT surgery is primary team  Subjective  Today he has no acute complaints  A & P   Right base atelectasis/infiltrate, persistent on chest x-ray from 8/17.  No new O2 requirements, remains afebrile, white count went up slightly from 12.4-14.6, denies any cough.  Patient  previously on empiric antibiotics with vancomycin and Zosyn (last dose of Vanco on 8/17) last dose of Zosyn 8/18 -Continue antibiotics per CT surgery (primary) -Monitor CBC, fever/temp trend  Sepsis secondary to aspiration pneumonia and mediastinitis, sepsis physiology now resolved.  In setting of esophageal rupture.   Remains afebrile, slight increase in leukocytosis, but clinically remained stable  -Antibiotics as above -Monitor CBC  Esophageal rupture leading to right-sided hydropneumothorax and right-sided complex loculated parapneumonic effusion.  Status post chest tube -CT surgery monitoring chest tube management -Continue Mucomyst, flutter valve, chest PT-  N.p.o. status in the setting of esophageal rupture -Continue tube feeds -If white count remains stable may be able to transition to clear liquids per CT surgery  Acute on chronic blood loss anemia.  In the setting of blood loss from esophageal rupture and surgery as well as hematemesis during hospitalization.  Hemoglobin currently stable  Hypophosphatemia, resolved. Improved with IV supplementation  Impaired mobility/generalized weakness -PT recommends SNF -Social worker consulted -Monitor CBC, transfuse if hemoglobin less than 7  Intermittent delirium.  Likely hospital-acquired given prolonged course.  Currently alert and oriented to place, self.  No longer restless or agitated. -Delirium precautions -As needed IV Haldol has been helpful   Paroxysmal atrial fibrillation.  No longer A. fib.   --Cardiology advised discontinuing diltiazem, Continuing metoprolol 50 mg twice daily as well as DOAC when he is able to tolerate  Sinus tachycardia, mild with heart rate in the low 100s not A. fib on my review of telemetry, suspect related to dehydration due to water deficits.  TSH within normal limits, hemoglobin stable -CT surgery plans to start liquid diet today, expect improvement with increase oral hydration -Continue to  monitor on telemetry  Prior CVA prior to admission -Holding home aspirin 325 -We will  likely resume Lipitor on discharge, continue to monitor LFTs  Elevated LFTs, continue to downtrend Denies any abdominal pain.  Hepatitis panel unremarkable -Continue to hold home statins  Left hydronephrosis.  Noted on CT scan.  As well as 9 mm obstructing proximal left ureteral stone  urology was consulted during hospitalization and recommended continue original plan of shockwave lithotripsy. this was deferred during current hospitalization due to respiratory status -Urology will arrange close follow-up to get this scheduled,        Family Communication  :  none  Code Status :  FULL  Disposition Plan  :  Patient is from home. Anticipated d/c date: 2 to 3 days. Barriers to d/c or necessity for inpatient status:  Discharge to SNF once medically stable, still needing final disposition for anticoagulation--cardiology recommending DOAC at discharge if able to tolerate,    Procedures  :    DVT Prophylaxis  :  SCDs   Lab Results  Component Value Date   PLT 704 (H) 05/31/2020    Diet :  Diet Order            Diet clear liquid Room service appropriate? Yes; Fluid consistency: Thin  Diet effective now                  Inpatient Medications Scheduled Meds: . bisacodyl  10 mg Oral Daily  . feeding supplement  1 Container Oral BID BM  . feeding supplement (PROSource TF)  45 mL Per Tube BID  . insulin aspart  0-24 Units Subcutaneous TID AC & HS  . mouth rinse  15 mL Mouth Rinse q12n4p  . metoprolol tartrate  50 mg Per Tube BID  . pantoprazole (PROTONIX) IV  40 mg Intravenous Q12H  . sennosides  5 mL Per Tube QHS   Continuous Infusions: . sodium chloride 70 mL/hr at 05/16/20 2122  . sodium chloride    . sodium chloride    . feeding supplement (OSMOLITE 1.5 CAL) 1,000 mL (05/31/20 1353)  . fluconazole (DIFLUCAN) IV 400 mg (05/31/20 0907)   PRN Meds:.sodium chloride, Place/Maintain  arterial line **AND** sodium chloride, Place/Maintain arterial line **AND** sodium chloride, [DISCONTINUED] acetaminophen **OR** acetaminophen, fentaNYL (SUBLIMAZE) injection, hydrALAZINE, ipratropium-albuterol, metoprolol tartrate, ondansetron (ZOFRAN) IV  Antibiotics  :   Anti-infectives (From admission, onward)   Start     Dose/Rate Route Frequency Ordered Stop   05/29/20 1800  vancomycin (VANCOCIN) IVPB 1000 mg/200 mL premix  Status:  Discontinued        1,000 mg 200 mL/hr over 60 Minutes Intravenous Every 12 hours 05/29/20 1151 05/30/20 0912   05/24/20 2100  vancomycin (VANCOREADY) IVPB 750 mg/150 mL  Status:  Discontinued        750 mg 150 mL/hr over 60 Minutes Intravenous Every 12 hours 05/24/20 0813 05/29/20 1151   05/24/20 0900  vancomycin (VANCOREADY) IVPB 1500 mg/300 mL        1,500 mg 150 mL/hr over 120 Minutes Intravenous  Once 05/24/20 0813 05/24/20 1221   05/17/20 0900  fluconazole (DIFLUCAN) IVPB 400 mg        400 mg 100 mL/hr over 120 Minutes Intravenous Every 24 hours 05/17/20 0749     05/17/20 0600  vancomycin (VANCOREADY) IVPB 750 mg/150 mL  Status:  Discontinued        750 mg 150 mL/hr over 60 Minutes Intravenous Every 8 hours 05/16/20 2154 05/17/20 0903   05/17/20 0400  vancomycin (VANCOREADY) IVPB 750 mg/150 mL  Status:  Discontinued  750 mg 150 mL/hr over 60 Minutes Intravenous Every 8 hours 05/16/20 1945 05/16/20 2154   05/16/20 2200  vancomycin (VANCOREADY) IVPB 1250 mg/250 mL        1,250 mg 166.7 mL/hr over 90 Minutes Intravenous STAT 05/16/20 2153 05/16/20 2348   05/16/20 2000  vancomycin (VANCOREADY) IVPB 1250 mg/250 mL  Status:  Discontinued        1,250 mg 166.7 mL/hr over 90 Minutes Intravenous  Once 05/16/20 1945 05/16/20 2153   05/15/20 1600  piperacillin-tazobactam (ZOSYN) IVPB 3.375 g  Status:  Discontinued        3.375 g 12.5 mL/hr over 240 Minutes Intravenous Every 8 hours 05/15/20 0945 05/30/20 0912   05/15/20 0930  piperacillin-tazobactam  (ZOSYN) IVPB 3.375 g        3.375 g 100 mL/hr over 30 Minutes Intravenous  Once 05/15/20 0921 05/15/20 1009   05/13/20 2000  azithromycin (ZITHROMAX) tablet 500 mg        500 mg Oral Daily 05/13/20 1155 05/14/20 2127   05/10/20 2200  cefTRIAXone (ROCEPHIN) 2 g in sodium chloride 0.9 % 100 mL IVPB  Status:  Discontinued        2 g 200 mL/hr over 30 Minutes Intravenous Every 24 hours 05/10/20 2148 05/15/20 0911   05/10/20 2200  azithromycin (ZITHROMAX) 500 mg in sodium chloride 0.9 % 250 mL IVPB  Status:  Discontinued        500 mg 250 mL/hr over 60 Minutes Intravenous Every 24 hours 05/10/20 2148 05/13/20 1155       Objective   Vitals:   05/31/20 0900 05/31/20 0925 05/31/20 1110 05/31/20 1200  BP: 133/86  137/84 118/72  Pulse:      Resp: (!) 23   (!) 23  Temp:   98.8 F (37.1 C)   TempSrc:   Oral   SpO2:  99%    Weight:      Height:        SpO2: 99 % O2 Flow Rate (L/min): 4 L/min FiO2 (%): 21 %  Wt Readings from Last 3 Encounters:  05/31/20 60.3 kg  05/09/20 69.4 kg  12/13/19 68.2 kg     Intake/Output Summary (Last 24 hours) at 05/31/2020 1438 Last data filed at 05/31/2020 1217 Gross per 24 hour  Intake 1194.42 ml  Output 1320 ml  Net -125.58 ml    Physical Exam:     Awake Alert, Oriented X 3, flat affect Normal respiratory effort on room air, diminished breath sounds on right side with chest tube in place with minimal output Abdomen soft, nondistended, PEG tube in place Tachycardic, normal rhythm,No Gallops,Rubs or new Murmurs,     I have personally reviewed the following:   Data Reviewed:  CBC Recent Labs  Lab 05/27/20 0332 05/28/20 0107 05/29/20 0033 05/30/20 0102 05/31/20 0047  WBC 13.5* 20.3* 22.2* 12.4* 14.6*  HGB 9.4* 10.5* 8.6* 9.0* 9.4*  HCT 29.7* 32.7* 27.8* 29.2* 30.8*  PLT 573* 545* 653* 719* 704*  MCV 87.9 86.3 86.9 88.0 86.3  MCH 27.8 27.7 26.9 27.1 26.3  MCHC 31.6 32.1 30.9 30.8 30.5  RDW 14.9 15.0 15.0 14.9 14.8  LYMPHSABS   --  1.1 1.2 0.9 0.9  MONOABS  --  1.4* 1.3* 0.7 0.8  EOSABS  --  0.0 0.2 0.4 0.1  BASOSABS  --  0.1 0.1 0.1 0.1    Chemistries  Recent Labs  Lab 05/27/20 0332 05/28/20 0107 05/29/20 0033 05/30/20 0102 05/31/20 0047  NA 135 136 136  134* 133*  K 4.0 4.3 4.3 4.3 5.0  CL 105 105 106 102 100  CO2 20* 21* 20* 24 24  GLUCOSE 160* 141* 140* 127* 140*  BUN 12 10 12 10 11   CREATININE 0.79 0.82 0.76 0.69 0.78  CALCIUM 8.2* 8.0* 8.1* 8.3* 8.8*  MG  --  2.1  --   --   --   AST 209* 65* 37 61* 67*  ALT 313* 247* 157* 172* 198*  ALKPHOS 238* 252* 210* 233* 250*  BILITOT 0.6 0.3 0.4 0.4 0.3   ------------------------------------------------------------------------------------------------------------------ No results for input(s): CHOL, HDL, LDLCALC, TRIG, CHOLHDL, LDLDIRECT in the last 72 hours.  Lab Results  Component Value Date   HGBA1C 5.5 07/12/2018   ------------------------------------------------------------------------------------------------------------------ No results for input(s): TSH, T4TOTAL, T3FREE, THYROIDAB in the last 72 hours.  Invalid input(s): FREET3 ------------------------------------------------------------------------------------------------------------------ No results for input(s): VITAMINB12, FOLATE, FERRITIN, TIBC, IRON, RETICCTPCT in the last 72 hours.  Coagulation profile No results for input(s): INR, PROTIME in the last 168 hours.  No results for input(s): DDIMER in the last 72 hours.  Cardiac Enzymes No results for input(s): CKMB, TROPONINI, MYOGLOBIN in the last 168 hours.  Invalid input(s): CK ------------------------------------------------------------------------------------------------------------------    Component Value Date/Time   BNP 68.0 05/14/2020 0604    Micro Results Recent Results (from the past 240 hour(s))  Aerobic/Anaerobic Culture (surgical/deep wound)     Status: None   Collection Time: 05/25/20  9:03 AM   Specimen: PATH  Other; Body Fluid  Result Value Ref Range Status   Specimen Description FLUID  Final   Special Requests EMPYEMA SPEC A  Final   Gram Stain   Final    ABUNDANT WBC PRESENT, PREDOMINANTLY PMN NO ORGANISMS SEEN    Culture   Final    RARE CANDIDA PARAPSILOSIS NO ANAEROBES ISOLATED Performed at [facility], [place] [address], [place], [place] [address]    Report Status 05/30/2020 FINAL  Final    Radiology Reports DG Chest 1 View  Result Date: 05/26/2020 CLINICAL DATA:  Esophageal anastomotic leak EXAM: CHEST  1 VIEW COMPARISON:  05/25/2020 FINDINGS: Right chest tubes and esophageal stent are again noted. There is new near total opacification of the right hemithorax. Right pleural effusion is present. Left lung remains clear. No pneumothorax. Cardiomediastinal contours are partially obscured. There is mild rightward mediastinal shift. IMPRESSION: New near total opacification of the right hemithorax likely reflecting combination of effusion and atelectasis. Electronically Signed   By: Macy Mis M.D.   On: 05/26/2020 08:21   DG Chest 1 View  Result Date: 05/25/2020 CLINICAL DATA:  Esophageal stent placement. EXAM: DG C-ARM 1-60 MIN; CHEST  1 VIEW FLUOROSCOPY TIME:  Fluoro time: 4 Minutes 17 secondsRadiation exposure index: MGy: 41.69Number of acquired spot images: Five spot fluoro graphic images. COMPARISON:  May 17, 2020.  May 23, 2020 FINDINGS: Spot fluoroscopy images were obtained for surgical planning purposes. Esophageal stent is seen traversing the thorax. Enteric contrast traverses the stent and enters the proximal stomach. None of the provided radiographs demonstrate evaluation of the site of previous leak into the RIGHT pleural space. Selective LEFT mainstem intubation. IMPRESSION: Spot fluoroscopy images from surgical planning purposes. Please reference procedure report for further details. Electronically Signed   By: Valentino Saxon MD   On: 05/25/2020 10:59   DG Chest 1  View  Result Date: 05/17/2020 CLINICAL DATA:  Surgery. Esophageal stent placement. EXAM: DG C-ARM 1-60 MIN; CHEST  1 VIEW FLUOROSCOPY TIME:  Fluoroscopy Time:  5 minutes 48 seconds Radiation Exposure Index (if provided by the fluoroscopic device): 59.06 mGy Number of Acquired Spot Images: 3 COMPARISON:  Preprocedural CT yesterday. FINDINGS: Three fluoroscopic spot views obtained in the operating room. Endoscope is in place. A stent overlies the endoscope with tip projecting over the esophageal diaphragmatic hiatus. Subsequent enteric tube courses in the stent. There is contrast opacifying the distal stent on image 1. Right chest tube is in place. IMPRESSION: Intraoperative fluoroscopy during esophageal stent placement. Please reference procedural report for details. Electronically Signed   By: Keith Rake M.D.   On: 05/17/2020 19:55   DG Chest 1 View  Result Date: 05/14/2020 CLINICAL DATA:  Complicated RIGHT pleural effusion post diagnostic thoracentesis EXAM: CHEST  1 VIEW COMPARISON:  05/10/2020 FINDINGS: Enlargement of cardiac silhouette. Mediastinal contours and pulmonary vascularity normal. Persistent RIGHT pleural effusion and significant atelectasis and/or consolidation of the mid to lower RIGHT lung. New lucency at the RIGHT apex, suspect representing apical pneumothorax. No mediastinal shift. Patient asymptomatic. Persistent infiltrate LEFT lower lobe and tiny LEFT pleural effusion. IMPRESSION: Probable RIGHT apical pneumothorax post diagnostic thoracentesis. Persistent complicated partially loculated RIGHT pleural effusion, RIGHT basilar atelectasis versus consolidation, and LEFT lower lobe consolidation. Patient asymptomatic. Findings discussed with Dr. Elsworth Soho on 05/14/2020 at 1545 hours. Electronically Signed   By: Lavonia Dana M.D.   On: 05/14/2020 15:51   CT CHEST WO CONTRAST  Result Date: 05/16/2020 CLINICAL DATA:  Pleural effusion. EXAM: CT CHEST WITHOUT CONTRAST TECHNIQUE: Multidetector CT  imaging of the chest was performed following the standard protocol without IV contrast. COMPARISON:  CT dated May 10, 2020 FINDINGS: Cardiovascular: The heart size is stable. There is some shift of the mediastinum to the left. There is a trace pericardial effusion. Mild atherosclerotic changes are noted of the thoracic aorta. Mediastinum/Nodes: --mild mediastinal adenopathy is noted -- there is mild bilateral hilar adenopathy. -- No axillary lymphadenopathy. -- No supraclavicular lymphadenopathy. --there is a left-sided thyroid nodule measuring approximately 2.4 cm. -the esophagus is diffusely thickened. There appears to be a defect in the right lateral aspect of the mid to distal esophagus (axial series 3, image 82-83). Adjacent to this defect is extraluminal oral contrast tracking into the right pleural space. Lungs/Pleura: There is been interval placement of a right-sided chest tube. There is a large right-sided hydropneumothorax which has increased in size since the patient's prior study despite placement of a right-sided chest tube. Additionally. The pleural effusion component appears more complex than prior study. There is new extensive debris within bronchus intermedius as well as the right lower lobe and right middle lobe bronchi consistent with significant interval aspiration. The right middle lobe and right lower lobe are essentially collapsed. There are hypoattenuating components within the right middle and right lower lobes concerning for developing aspiration pneumonia. There are new areas of consolidation involving the left lower and left upper lobes concerning for aspiration. There is a small to moderate-sized left-sided pleural effusion. The chest tube is kinked at its distal most aspect. Upper Abdomen: There is no definite acute abnormality in the upper abdomen. Musculoskeletal: No chest wall abnormality. No bony spinal canal stenosis. IMPRESSION: 1. Overall findings consistent with esophageal  perforation arising from the right lateral aspect of the mid to distal esophagus as detailed above. There is extraluminal oral contrast tracking from this esophageal wall defect into the right pleural space. 2. Large right-sided hydropneumothorax with significant interval worsening since the prior study despite right-sided chest tube placement. Additionally, the fluid component appears  more complex. The right-sided chest tube appears to enter the thoracic cavity inferiorly and courses along the right hemidiaphragm and terminates in the region of the right epicardial fat pad. The tube is kinked at its tip. While the tube is likely intrathoracic, it will need significant repositioning to be effective. 3. Findings consistent with interval aspiration with complete occlusion of bronchus intermedius as well as the right middle and right lower lobe bronchi. There is likely developing aspiration pneumonia within the collapsed right middle and right lower lobes as well as the posterior left upper and left lower lobes. There is a small left-sided pleural effusion. 4. Persistent diffuse esophageal wall thickening of unknown clinical significance. Correlation with endoscopy is recommended as an underlying mass is not excluded. 5. Again noted is a left-sided thyroid nodule measuring approximately 2.4 cm. Outpatient thyroid ultrasound follow-up is recommended.(Ref: J Am Coll Radiol. 2015 Feb;12(2): 143-50). These results were called by telephone at the time of interpretation on 05/16/2020 at 6:04 pm to provider [facility] AL[facility] , who verbally acknowledged these results. Electronically Signed   By: Constance Holster M.D.   On: 05/16/2020 18:04   CT CHEST W CONTRAST  Result Date: 05/24/2020 CLINICAL DATA:  Esophageal perforation, progressive leukocytosis, abnormal chest x-ray, dyspnea EXAM: CT CHEST WITH CONTRAST TECHNIQUE: Multidetector CT imaging of the chest was performed during intravenous contrast administration. CONTRAST:  60mL  OMNIPAQUE IOHEXOL 350 MG/ML SOLN COMPARISON:  Chest radiograph 05/22/2020, CT 05/10/2020 FINDINGS: Cardiovascular: Extensive coronary artery calcification. Global cardiac size within normal limits. Trace pericardial fluid is likely physiologic in is stable since prior examination. Thoracic aorta is age-appropriate. Mediastinum/Nodes: And endoluminal stent graft has been placed within the esophagus extending from the level of the sternal notch within the proximal esophagus through the mid to distal esophagus at the level of the left atrium. The stent graft is not completely appose the esophageal lumen proximally, best seen on axial image # 27/3. Additionally, there is a defect involving the right posterolateral aspect of the esophagus, best seen on axial image # 77/3, with an adjacent loculated collection of gas, fluid, and extravasated contrast measuring 4.2 x 7.6 x 8.4 cm on axial image # 82 and sagittal image # 77. The distal margin of the stent graft does appear to completely apposed the esophageal lumen, though this is extremely close to the point of perforation. The distal esophagus again demonstrates marked circumferential wall thickening in keeping with esophagitis. Numerous shotty enhancing right paratracheal lymph nodes are identified, likely reactive. No pathologic thoracic adenopathy. Largely cystic nodule again identified within the left thyroid lobe, unchanged. Lungs/Pleura: Right chest tube is in place extending posteriorly and apically. There is collapse and consolidation of the right middle and lower lobes. There is partially loculated gas and fluid within the right pleural space laterally and within the major inter lobar fissure. The pleural fluid component is relatively small and communicates with the chest tube and has significantly decreased in volumes since prior examination, both in its free-flowing component as well as the a loculated component within the inter lobar fissure. The previously  noted extravasated collection, however, does appear isolated from the chest tube. Bronchial wall thickening within the aerated right upper lobe is in keeping with airway inflammation. The central airways of the right lower lobe appear impacted and fluid filled. Minimal infiltrate is seen within the basilar lingula and left lower lobe, infectious or inflammatory in nature. No central obstructing mass. No pneumothorax or pleural effusion on the left. Upper Abdomen: Left hydronephrosis  is partially visualized, incompletely assessed on this examination. This appears similar to that noted on prior examination of 05/13/2020. Musculoskeletal: No acute bone abnormality. IMPRESSION: Interval placement of right chest tube with significant evacuation loculated right pleural fluid. A small free-flowing fluid in gas component remains as well as a small partially loculated component within the inter lobar fissure. Interval placement of an and luminal stent graft within the esophagus with incomplete apposition of the graft proximally likely resulting in contrast coursing between the stent graft and the esophageal lumen to the point of leakage noted on prior esophagram. Pilar Plate perforation of the right posterolateral mid to distal esophagus again noted. Associated loculated extravasated contrast and fluid collection within the right pleural space appears isolated from the adjacent right chest tube. Collapse and consolidation of the right middle and right lower lobe with extensive fluid opacification of the right lower lobe airways. Airway inflammation involving the right upper lobe and probable minimal infectious or inflammatory infiltrate within the left lung base. Left hydronephrosis, similar to that noted on prior examination, incompletely evaluated on this examination. Electronically Signed   By: Fidela Salisbury MD   On: 05/24/2020 15:21   CT Angio Chest PE W and/or Wo Contrast  Result Date: 05/11/2020 CLINICAL DATA:   Shortness of breath and weakness for several days EXAM: CT ANGIOGRAPHY CHEST WITH CONTRAST TECHNIQUE: Multidetector CT imaging of the chest was performed using the standard protocol during bolus administration of intravenous contrast. Multiplanar CT image reconstructions and MIPs were obtained to evaluate the vascular anatomy. CONTRAST:  49mL OMNIPAQUE IOHEXOL 350 MG/ML SOLN COMPARISON:  Chest x-ray from earlier in the same day, CTA from 07/12/2018. FINDINGS: Cardiovascular: Thoracic aorta demonstrates atherosclerotic calcifications. No aneurysmal dilatation or dissection is noted. No cardiac enlargement is seen. Coronary calcifications are noted. The pulmonary artery shows a normal branching pattern. No definitive filling defect is identified to suggest pulmonary embolism. Mediastinum/Nodes: Thoracic inlet shows a hypodensity within the left lobe of the thyroid measuring 17 mm in greatest dimension. This is relatively stable from a prior CT examination from 2019. No sizable hilar or mediastinal adenopathy is noted. The esophagus demonstrates some diffuse wall thickening which may be related to reflux esophagitis. Lungs/Pleura: Bilateral moderate pleural effusions are seen. Associated lower lobe consolidation is noted. No sizable parenchymal nodules are seen. No pneumothorax is noted. Upper Abdomen: Visualized upper abdomen shows geographic decreased attenuation within the left kidney and enlargement of the left kidney. This may simply be related to hydronephrosis. The possibility of an infiltrating mass deserves consideration. This can be further evaluated with CT of the abdomen and pelvis with contrast. Musculoskeletal: No acute bony abnormality is noted. Review of the MIP images confirms the above findings. IMPRESSION: No evidence of pulmonary emboli. Bilateral pleural effusions and lower lobe consolidation similar to that seen on recent chest x-ray. This is consistent with bibasilar pneumonia and reactive  effusions. Diffuse wall thickening throughout the esophagus likely related to reflux esophagitis. Clinical correlation is recommended. It should be noted this is stable from a prior exam of 2019. Enlargement of the left kidney with decreased attenuation identified. Although this may simply represent hydronephrosis possibility of an underlying infiltrative mass deserves consideration. CT of the abdomen and pelvis with contrast is recommended. 17 mm left thyroid nodule. This is roughly stable from the prior CT examination from 2019 and likely benign. Recommend thyroid US (ref: J Am Coll Radiol. 2015 Feb;12(2): 143-50). Electronically Signed   By: Inez Catalina M.D.   On: 05/11/2020 00:00  CT ABDOMEN PELVIS W CONTRAST  Result Date: 05/13/2020 CLINICAL DATA:  LEFT renal mass. Suspicion for renal mass on prior chest CT. EXAM: CT ABDOMEN AND PELVIS WITH CONTRAST TECHNIQUE: Multidetector CT imaging of the abdomen and pelvis was performed using the standard protocol following bolus administration of intravenous contrast. CONTRAST:  170mL OMNIPAQUE IOHEXOL 300 MG/ML  SOLN COMPARISON:  Chest CT of May 10, 2020 FINDINGS: Lower chest: Loculated bilateral basilar effusions with similar appearance when compared to the study of May 10, 2020. Basilar airspace disease with increasing airspace disease particularly at the LEFT lung base. Hiatal hernia and patulous distal esophagus. Calcified coronary artery disease partially imaged. No pericardial effusion. Hepatobiliary: Liver without focal, suspicious lesion. Portal vein is patent. No pericholecystic stranding. No biliary duct dilation. Pancreas: Pancreas is normal without peripancreatic stranding, ductal dilation or inflammation. Spleen: Spleen normal in size and contour. Adrenals/Urinary Tract: Adrenal glands are normal. Moderate to marked LEFT-sided hydroureteronephrosis with overlying cortical thinning. Large calculus in the proximal LEFT ureter measuring 9 x 8 x 8 mm. No  perinephric abscess. Urinary bladder is normal. Normal enhancement of the RIGHT kidney. Enhancement of the kidneys despite scarring and dilation is relatively symmetric and there is an additional small calculus to moderate sized calculus in the lower pole the LEFT kidney. Area with limited assessment due to motion artifact. Stomach/Bowel: Hiatal hernia and some distal esophageal thickening as described. Small duodenal diverticulum in the second portion of the duodenum. No acute gastrointestinal process. Appendix not visualized. Motion artifact in this area limiting assessment but no gross findings of inflammation that would indicate pericecal stranding or secondary signs of acute appendicitis. Colonic interposition along the anterior portion of the liver. No pericolonic stranding. No perirectal stranding. Vascular/Lymphatic: Calcific and noncalcific atheromatous plaque of the abdominal aorta. No aneurysmal dilation. No adenopathy in the retroperitoneum or upper abdomen. No pelvic lymphadenopathy. Reproductive: Heterogeneous prostate. Calcifications of the vas deferens. Other: No ascites. Musculoskeletal: No acute or destructive bone finding. Spinal degenerative changes. IMPRESSION: 1. Moderate to marked hydronephrosis due to calculus in the proximal to mid LEFT ureter. Urologic consultation and correlation with urine studies to exclude concomitant infection is suggested. 2. Cortical thinning that is seen on the LEFT and preservation of renal enhancement suggests that an element of this process could potentially be chronic. 3. Loculated effusions and worsening of LEFT lower lobe airspace disease that may represent a combination of volume loss and pneumonitis 4. Loculated bilateral basilar effusions with similar appearance when compared to the study of May 10, 2020. Basilar airspace disease with increasing airspace disease particularly at the LEFT lung base. Findings may represent worsening pneumonia or aspiration.  5. Hiatal hernia and patulous distal esophagus. 6. Aortic atherosclerosis. Aortic Atherosclerosis (ICD10-I70.0). Electronically Signed   By: Zetta Bills M.D.   On: 05/13/2020 12:52   DG Chest Port 1 View  Result Date: 05/29/2020 CLINICAL DATA:  Chest tube.  Shortness of breath. EXAM: PORTABLE CHEST 1 VIEW COMPARISON:  05/28/2020. FINDINGS: Two right chest tubes in stable position. Esophageal stent in stable position. Stable cardiomegaly. Progressive dense right base atelectasis/infiltrate. Persistent right mid lung field infiltrate. Small right pleural effusion, improved from prior exam. No pneumothorax. IMPRESSION: 1. Two right chest tubes in stable position. Esophageal stent stable position. No pneumothorax. 2.  Stable cardiomegaly. 3. Progressive dense right base atelectasis/infiltrate. Persistent right mid lung field infiltrate. 3.  Small right pleural effusion, improved from prior exam. Electronically Signed   By: [place]   On: 05/29/2020 07:38  DG Chest Port 1 View  Result Date: 05/28/2020 CLINICAL DATA:  Pleural effusion, chest tube EXAM: PORTABLE CHEST 1 VIEW COMPARISON:  05/27/2020 FINDINGS: There are two right-sided chest tubes. Esophageal stent is again noted. Improved aeration of the right lung with persistent but decreased right pleural effusion. No pneumothorax. Cardiomediastinal contours are partially obscured IMPRESSION: Improved aeration of right lung since 05/27/2020 with persistent but decreased right pleural effusion. Electronically Signed   By: Macy Mis M.D.   On: 05/28/2020 09:53   DG Chest Port 1 View  Result Date: 05/27/2020 CLINICAL DATA:  Shortness of breath EXAM: PORTABLE CHEST 1 VIEW COMPARISON:  05/26/2020 FINDINGS: Cardiac shadow is stable. Esophageal stent is again noted and stable. Two right-sided chest tubes are seen in satisfactory position. The overall appearance is stable. Persistent opacification of the right hemithorax is again noted similar to  that seen on recent plain film evaluation. No new focal abnormality is noted. IMPRESSION: Stable appearance of the chest with consolidation in the right hemithorax. Electronically Signed   By: Inez Catalina M.D.   On: 05/27/2020 07:22   DG Chest Port 1 View  Result Date: 05/25/2020 CLINICAL DATA:  Leak EXAM: PORTABLE CHEST 1 VIEW COMPARISON:  May 23, 2020 FINDINGS: The cardiomediastinal silhouette is unchanged in contour.RIGHT-sided chest tubes. Moderate RIGHT pleural effusion. Esophageal stent projects over the mediastinum inferiorly. No pneumothorax. Persistent RIGHT-sided heterogeneous opacities are not significantly changed in comparison to prior. Minimal opacities of the LEFT lung. No acute osseous abnormality. IMPRESSION: Esophageal stent projects over the inferior mediastinum. Electronically Signed   By: Valentino Saxon MD   On: 05/25/2020 11:57   DG CHEST PORT 1 VIEW  Result Date: 05/22/2020 CLINICAL DATA:  59 year old male with perforated esophagus status post surgical exploration, esophageal stent placement and percutaneous gastrostomy tube placement postoperative day 5. Shortness of breath. EXAM: PORTABLE CHEST 1 VIEW COMPARISON:  Portable chest 0535 hours today and earlier. FINDINGS: Portable AP semi upright view at 2330 hours. Stable mediastinal contours. Esophageal stent configuration appears stable since 05/21/2020. But there is increasing confluent right lung base opacity. A single right right chest tube remains in place. No pneumothorax identified. The left lung remains clear allowing for portable technique. Visualized tracheal air column is within normal limits. IMPRESSION: 1. Increasing right lung base opacity since yesterday, indeterminate for pleural fluid versus consolidation. 2. Single right chest tube remains in place, no pneumothorax. 3. Esophageal stent configuration appears stable. Electronically Signed   By: Genevie Ann M.D.   On: 05/22/2020 23:39   DG CHEST PORT 1  VIEW  Result Date: 05/22/2020 CLINICAL DATA:  Follow-up pneumothorax EXAM: PORTABLE CHEST 1 VIEW COMPARISON:  05/21/2020 FINDINGS: Cardiac shadow is stable. Esophageal stent is again noted and stable. Right thoracostomy catheter is seen without evidence of pneumothorax. Right basilar atelectasis and small effusion are seen. The left lung is clear. IMPRESSION: No pneumothorax is noted. Small right pleural effusion and basilar atelectasis. Electronically Signed   By: Inez Catalina M.D.   On: 05/22/2020 08:03   DG Chest Port 1 View  Result Date: 05/21/2020 CLINICAL DATA:  Postop from repair of esophageal rupture. EXAM: PORTABLE CHEST 1 VIEW COMPARISON:  05/20/2020 FINDINGS: An endoluminal stent is seen within the thoracic esophagus. 2 right-sided chest tubes remain in place. Small approximately 10% right pneumothorax is noted, which appears slightly increased in size since previous study. Bibasilar atelectasis is again seen, right side greater than left, without significant change. Mild cardiomegaly remains stable. IMPRESSION: Small approximately 10% right  pneumothorax, slightly increased in size since prior study. Stable bibasilar atelectasis, right side greater than left. Electronically Signed   By: Marlaine Hind M.D.   On: 05/21/2020 08:07   DG Chest Port 1 View  Result Date: 05/20/2020 CLINICAL DATA:  Shortness of breath. RIGHT-sided chest tube present. EXAM: PORTABLE CHEST 1 VIEW COMPARISON:  05/19/2020 FINDINGS: Patient has dual RIGHT-sided chest tubes, unchanged in position. There is no pneumothorax. There is persistent RIGHT pleural effusion and significant opacity at the RIGHT lung base which partially obscures the hemidiaphragm. Subsegmental atelectasis at the LEFT lung base. Stable appearance of esophageal stent. IMPRESSION: 1. Stable appearance of the chest. 2. No pneumothorax. 3. Persistent RIGHT basilar opacity and pleural effusion. Electronically Signed   By: Nolon Nations M.D.   On: 05/20/2020  09:51   DG CHEST PORT 1 VIEW  Result Date: 05/19/2020 CLINICAL DATA:  [place] EXAM: PORTABLE CHEST 1 VIEW COMPARISON:  05/18/2020 FINDINGS: Two right apical chest tubes.  No pneumothorax is seen. Patchy right mid/lower lung opacities, reflecting a combination of pneumonia and known complex right pleural fluid collection/empyema. Mild patchy opacity in the left mid/lower lung, likely reflecting pneumonia. Stable esophageal stent. Cardiomegaly. IMPRESSION: Two right apical chest tubes.  No pneumothorax is seen. Multifocal pneumonia, grossly unchanged. Superimposed complex right pleural fluid collection/empyema. Esophageal stent. Electronically Signed   By: Julian Hy M.D.   On: 05/19/2020 07:12   DG CHEST PORT 1 VIEW  Result Date: 05/18/2020 CLINICAL DATA:  Esophageal perforation. EXAM: PORTABLE CHEST 1 VIEW COMPARISON:  05/17/2020. FINDINGS: Esophageal stent stable position. Two right chest tubes in stable position. Stable cardiomegaly. No pulmonary venous congestion. Low lung volumes with bibasilar atelectasis. Bilateral pulmonary infiltrates/edema again noted. Interim slight increase in right-sided pleural effusion. No pneumothorax noted on today's exam. IMPRESSION: 1. Esophageal stent and 2 right chest tubes noted in stable position. No pneumothorax noted on today's exam. Interim slight increase in right pleural effusion. 2. Low lung volumes with bibasilar atelectasis. Persistent bilateral pulmonary infiltrates/edema without interim change. 3.  Stable cardiomegaly.  No pulmonary venous congestion Electronically Signed   By: Marcello Moores  Register   On: 05/18/2020 05:41   DG Chest Port 1 View  Result Date: 05/17/2020 CLINICAL DATA:  Esophageal stent placement. EXAM: PORTABLE CHEST 1 VIEW COMPARISON:  Chest CT yesterday. FINDINGS: Interval placement of esophageal stent. Two right chest tubes in place. Decreased right hydropneumothorax. Persistent pleural fluid in extrapleural air at the right lung base.  Patchy opacity in the right mid lower lung zone. Small left pleural effusion and basilar opacities. Heart size grossly stable. There is contrast within the stomach. IMPRESSION: 1. Interval placement of esophageal stent. 2. Two right chest tubes in place with decreased right hydropneumothorax. Small volume of residual pleural fluid in extrapleural air at the right lung base. 3. Patchy opacity in the right mid and lower lung zone, may represent atelectasis or re-expansion pulmonary edema. 4. Small left pleural effusion and basilar opacities. Electronically Signed   By: Keith Rake M.D.   On: 05/17/2020 19:57   DG CHEST PORT 1 VIEW  Result Date: 05/16/2020 CLINICAL DATA:  Chest tube insertion for pneumothorax. EXAM: PORTABLE CHEST 1 VIEW COMPARISON:  May 16, 2020 FINDINGS: A right-sided chest tube is noted. Its distal end is kinked and is seen overlying the lateral aspect of the right upper quadrant. A large, stable right-sided hydropneumothorax is seen with subsequent collapse of the right lung. This is unchanged in appearance when compared to the prior study. Stable mild  to moderate severity atelectasis and/or infiltrate is noted within the left lung base. The heart size and mediastinal contours are within normal limits. The visualized skeletal structures are unremarkable. IMPRESSION: 1. Stable large right-sided hydropneumothorax with subsequent collapse of the right lung. 2. Stable mild to moderate severity left basilar atelectasis and/or infiltrate. 3. Right-sided chest tube with its distal end kinked and seen overlying the right upper quadrant. Electronically Signed   By: Virgina Norfolk M.D.   On: 05/16/2020 16:52   DG CHEST PORT 1 VIEW  Result Date: 05/16/2020 CLINICAL DATA:  Pneumothorax post recent thoracentesis, shortness of breath EXAM: PORTABLE CHEST 1 VIEW COMPARISON:  Portable exam 0849 hours compared to 05/14/2020 FINDINGS: Stable heart size and mediastinal contours. RIGHT  hydropneumothorax, with increased collapse of RIGHT lung since previous exam. Minimal LEFT pleural effusion with atelectasis versus infiltrate LEFT lower lobe. LEFT upper lung clear. Bones demineralized. IMPRESSION: RIGHT hydropneumothorax with increased collapse of RIGHT lung since previous exam. Minimal LEFT pleural effusion with atelectasis versus infiltrate at LEFT lower lobe. Findings called to Dr. Elsworth Soho on 05/16/2020 at 0922 hours. Electronically Signed   By: Lavonia Dana M.D.   On: 05/16/2020 09:38   DG Chest Port 1 View  Result Date: 05/10/2020 CLINICAL DATA:  Questionable sepsis.  Pneumonia diagnosis yesterday. EXAM: PORTABLE CHEST 1 VIEW COMPARISON:  Radiograph yesterday. FINDINGS: Upper normal heart size. Unchanged mediastinal contours. Hazy symmetric bibasilar opacities are unchanged from radiograph yesterday. No pulmonary edema. No pneumothorax. IMPRESSION: Unchanged symmetric bibasilar opacities are unchanged from yesterday, likely combination of pleural effusions and atelectasis/pneumonia. Electronically Signed   By: Keith Rake M.D.   On: 05/10/2020 21:24   DG Chest Port 1 View  Result Date: 05/09/2020 CLINICAL DATA:  Cough and fever. EXAM: PORTABLE CHEST 1 VIEW COMPARISON:  09/06/2018 FINDINGS: Heart size is normal. There are bilateral pleural effusions and infiltrate/atelectasis in both lower lungs, consistent with pneumonia. Upper lungs are clear. IMPRESSION: Bilateral lower lobe atelectasis and pneumonia.  Small effusions. Electronically Signed   By: Nelson Chimes M.D.   On: 05/09/2020 14:16   DG Chest Port 1V same Day  Result Date: 05/14/2020 CLINICAL DATA:  Follow-up right-sided pneumothorax. EXAM: PORTABLE CHEST 1 VIEW COMPARISON:  May 14, 2020 (3:02 p.m.) FINDINGS: Stable moderate to marked severity atelectasis and/or infiltrate is noted within the right lung base with stable mild to moderate severity left basilar atelectasis and/or infiltrate seen. There is a small, stable  left pleural effusion with a stable moderate sized right pleural effusion. A stable moderate sized right apical pneumothorax is seen. A mild amount of radiopaque contrast is again seen along the infrahilar region on the right. The heart size and mediastinal contours are within normal limits. The visualized skeletal structures are unremarkable. IMPRESSION: No significant interval change when compared to the prior study dated May 14, 2020 (3:02 p.m.) Electronically Signed   By: Virgina Norfolk M.D.   On: 05/14/2020 19:27   DG Swallowing Func-Speech Pathology  Result Date: 05/14/2020 Objective Swallowing Evaluation: Type of Study: MBS-Modified Barium Swallow Study  Patient Details Name: Javares Kaufhold MRN: [id number] Date of Birth: 10/13/1961 Today's Date: 05/14/2020 Time: SLP Start Time (ACUTE ONLY): 75 -SLP Stop Time (ACUTE ONLY): 1300 SLP Time Calculation (min) (ACUTE ONLY): 30 min Past Medical History: Past Medical History: Diagnosis Date . Cocaine abuse ([place])  . ETOH abuse  . GERD (gastroesophageal reflux disease)  . Hyperlipidemia  . 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 BIOPSY  08/25/2018  Procedure: BIOPSY;  Surgeon: [name], MD;  [facility]: [facility];  Service: Endoscopy;; . BIOPSY  12/13/2018  Procedure: BIOPSY;  Surgeon: [name], MD;  Location: [facility];  Service: Endoscopy;;  esophagus . COLONOSCOPY WITH PROPOFOL N/A 04/07/2019  Dr. [name]: 3 Tubular adenomas removed (4 to 8 mm in size), next colonoscopy 3 years. . ESOPHAGOGASTRODUODENOSCOPY (EGD) WITH PROPOFOL N/A 08/25/2018  Dr. [name]: Severely inflamed abnormal appearing mid/distal esophagus.  Encroachment on lumen suspicious for infiltrating neoplasm located be all from severe benign inflammation, extensive gastric erosions and extensive duodenal ulcerations.  Query ischemic process versus other.  Biopsy revealed Barrett's esophagus  but negative for malignancy and H. pylori. . ESOPHAGOGASTRODUODENOSCOPY (EGD) WITH PROPOFOL N/A 12/13/2018  Dr. [name]: peptic stricture s/p balloon dilatation. abnormal esophageal mucosa, by c/w barrett's without dysplasia, previous PUD completely healed.  Marland Kitchen POLYPECTOMY  04/07/2019  Procedure: POLYPECTOMY;  Surgeon: [name], MD;  Location: [facility];  Service: Endoscopy;;  colon HPI: 59 y.o. male with medical history significant for prior stroke, hyperlipidemia, GERD, BPH, barrett's esophagus    and remote history of cocaine abuse who presents to the emergency department due to 5 days of weakness, complains of productive cough with clear phlegm with occasional black speckles with associated reproducible midsternal chest pain. Patient was seen in the ED 2 days ago (7/28) due to similar symptoms, during which he reported a max temperature of 100F.  Chest x-ray done at that time showed pneumonia and patient was treated with IV fluid.  Amoxicillin and doxycycline were prescribed on the was advised to follow-up with his primary care physician.  Patient returned to the ED yesterday due to increased heart rate and sob.  He states that he was taking his blood pressure medication as prescribed and denies cocaine use. Bilateral pleural effusions and lower lobe consolidation similar to that seen on recent chest x-ray. This is consistent with bibasilar pneumonia and reactive effusions. Diffuse wall thickening throughout the esophagus likely related to reflux esophagitis. Clinical correlation is recommended. It should be noted this is stable from a prior exam of 2019.  Subjective: "My chest hurts when I am coughing sometimes." Assessment / Plan / Recommendation CHL IP CLINICAL IMPRESSIONS 05/14/2020 Clinical Impression Pt presents with mild oropharyngeal phase dysphagia characterized by lingual pumping with reduced bolus cohesiveness and premature spillage of liquids to the valleculae. Hyolaryngeal excursion appeared WNL.  Pt without penetration/aspiration across consistencies/textures and only with trace amounts of thin liquid residuals along base of tongue and in lateral channels which cleared with secondary swallow. Pt is currently on 4L O2 via nasal cannula. Risk for aspiration with po intake appears minimal, however his respiratory status slightly increases his risk. Pt also with history of esophageal dysphagia. Recommend regular textures and thin liquids with standard aspiration and reflux precautions. No further SLP services indicated at this time. This study was reviewed with Dr. Elsworth Soho (pulmonary).  SLP Visit Diagnosis Dysphagia, oropharyngeal phase (R13.12) Attention and concentration deficit following -- Frontal lobe and executive function deficit following -- Impact on safety and function Mild aspiration risk   CHL IP TREATMENT RECOMMENDATION 05/14/2020 Treatment Recommendations No treatment recommended at this time   Prognosis 05/14/2020 Prognosis for Safe Diet Advancement Good Barriers to Reach Goals -- Barriers/Prognosis Comment -- CHL IP DIET RECOMMENDATION 05/14/2020 SLP Diet Recommendations Regular solids;Thin liquid Liquid Administration via Cup;Straw Medication Administration Whole meds with liquid Compensations Slow rate;Small sips/bites Postural Changes Seated upright  at 90 degrees;Remain semi-upright after after feeds/meals (Comment)   CHL IP OTHER RECOMMENDATIONS 05/14/2020 Recommended Consults -- Oral Care Recommendations Oral care BID Other Recommendations Clarify dietary restrictions   CHL IP FOLLOW UP RECOMMENDATIONS 05/14/2020 Follow up Recommendations None   CHL IP FREQUENCY AND DURATION 05/14/2020 Speech Therapy Frequency (ACUTE ONLY) min 2x/week Treatment Duration 1 week      CHL IP ORAL PHASE 05/14/2020 Oral Phase Impaired Oral - Pudding Teaspoon -- Oral - Pudding Cup -- Oral - Honey Teaspoon -- Oral - Honey Cup -- Oral - Nectar Teaspoon -- Oral - Nectar Cup -- Oral - Nectar Straw -- Oral - Thin Teaspoon Premature  spillage Oral - Thin Cup Premature spillage;Decreased bolus cohesion Oral - Thin Straw Premature spillage Oral - Puree Lingual pumping Oral - Mech Soft -- Oral - Regular Delayed oral transit Oral - Multi-Consistency -- Oral - Pill -- Oral Phase - Comment --  CHL IP PHARYNGEAL PHASE 05/14/2020 Pharyngeal Phase Impaired Pharyngeal- Pudding Teaspoon -- Pharyngeal -- Pharyngeal- Pudding Cup -- Pharyngeal -- Pharyngeal- Honey Teaspoon -- Pharyngeal -- Pharyngeal- Honey Cup -- Pharyngeal -- Pharyngeal- Nectar Teaspoon -- Pharyngeal -- Pharyngeal- Nectar Cup -- Pharyngeal -- Pharyngeal- Nectar Straw -- Pharyngeal -- Pharyngeal- Thin Teaspoon Delayed swallow initiation-vallecula Pharyngeal -- Pharyngeal- Thin Cup Delayed swallow initiation-vallecula;Lateral channel residue Pharyngeal -- Pharyngeal- Thin Straw Lateral channel residue;Delayed swallow initiation-vallecula Pharyngeal -- Pharyngeal- Puree Delayed swallow initiation-vallecula;WFL Pharyngeal -- Pharyngeal- Mechanical Soft -- Pharyngeal -- Pharyngeal- Regular WFL Pharyngeal -- Pharyngeal- Multi-consistency -- Pharyngeal -- Pharyngeal- Pill WFL Pharyngeal -- Pharyngeal Comment --  CHL IP CERVICAL ESOPHAGEAL PHASE 05/14/2020 Cervical Esophageal Phase WFL Pudding Teaspoon -- Pudding Cup -- Honey Teaspoon -- Honey Cup -- Nectar Teaspoon -- Nectar Cup -- Nectar Straw -- Thin Teaspoon -- Thin Cup -- Thin Straw -- Puree -- Mechanical Soft -- Regular -- Multi-consistency -- Pill -- Cervical Esophageal Comment -- Thank you, Genene Churn, [place] PORTER,DABNEY 05/14/2020, 2:21 PM              DG C-Arm 1-60 Min  Result Date: 05/25/2020 CLINICAL DATA:  Esophageal stent placement. EXAM: DG C-ARM 1-60 MIN; CHEST  1 VIEW FLUOROSCOPY TIME:  Fluoro time: 4 Minutes 17 secondsRadiation exposure index: MGy: 41.69Number of acquired spot images: Five spot fluoro graphic images. COMPARISON:  May 17, 2020.  May 23, 2020 FINDINGS: Spot fluoroscopy images were obtained for  surgical planning purposes. Esophageal stent is seen traversing the thorax. Enteric contrast traverses the stent and enters the proximal stomach. None of the provided radiographs demonstrate evaluation of the site of previous leak into the RIGHT pleural space. Selective LEFT mainstem intubation. IMPRESSION: Spot fluoroscopy images from surgical planning purposes. Please reference procedure report for further details. Electronically Signed   By: Valentino Saxon MD   On: 05/25/2020 10:59   DG C-Arm 1-60 Min  Result Date: 05/17/2020 CLINICAL DATA:  Surgery. Esophageal stent placement. EXAM: DG C-ARM 1-60 MIN; CHEST  1 VIEW FLUOROSCOPY TIME:  Fluoroscopy Time:  5 minutes 48 seconds Radiation Exposure Index (if provided by the fluoroscopic device): 59.06 mGy Number of Acquired Spot Images: 3 COMPARISON:  Preprocedural CT yesterday. FINDINGS: Three fluoroscopic spot views obtained in the operating room. Endoscope is in place. A stent overlies the endoscope with tip projecting over the esophageal diaphragmatic hiatus. Subsequent enteric tube courses in the stent. There is contrast opacifying the distal stent on image 1. Right chest tube is in place. IMPRESSION: Intraoperative fluoroscopy during esophageal stent placement. Please reference procedural report for  details. Electronically Signed   By: Keith Rake M.D.   On: 05/17/2020 19:55   ECHOCARDIOGRAM COMPLETE  Result Date: 05/11/2020    ECHOCARDIOGRAM REPORT   Patient Name:   HIEP OLLIS Ly Date of Exam: 05/11/2020 Medical Rec #:  [id number]           Height:       69.0 in Accession #:    [id number]          Weight:       153.0 lb Date of Birth:  August 07, 1961           BSA:          1.844 m Patient Age:    38 years            BP:           141/90 mmHg Patient Gender: M                   HR:           100 bpm. Exam Location:  [facility] Procedure: 2D Echo, Cardiac Doppler and Color Doppler Indications:    CHF  History:        Patient has prior history of  Echocardiogram examinations, most                 recent 07/13/2018. CHF, Stroke, Signs/Symptoms:Shortness of                 Breath; Risk Factors:Hypertension and Dyslipidemia. Cocaine                 abuse, Pneumonia.  Sonographer:    Dustin Flock RDCS Referring Phys: [id number] [place]  1. Left ventricular ejection fraction, by estimation, is 65 to 70%. The left ventricle has normal function. The left ventricle has no regional wall motion abnormalities. Left ventricular diastolic parameters are consistent with Grade I diastolic dysfunction (impaired relaxation).  2. Right ventricular systolic function is normal. The right ventricular size is normal. Tricuspid regurgitation signal is inadequate for assessing PA pressure.  3. The mitral valve is grossly normal. Trivial mitral valve regurgitation.  4. The aortic valve is tricuspid. Aortic valve regurgitation is not visualized.  5. The inferior vena cava is normal in size with greater than 50% respiratory variability, suggesting right atrial pressure of 3 mmHg. FINDINGS  Left Ventricle: Left ventricular ejection fraction, by estimation, is 65 to 70%. The left ventricle has normal function. The left ventricle has no regional wall motion abnormalities. The left ventricular internal cavity size was normal in size. There is  no left ventricular hypertrophy. Left ventricular diastolic parameters are consistent with Grade I diastolic dysfunction (impaired relaxation). Right Ventricle: The right ventricular size is normal. No increase in right ventricular wall thickness. Right ventricular systolic function is normal. Tricuspid regurgitation signal is inadequate for assessing PA pressure. Left Atrium: Left atrial size was normal in size. Right Atrium: Right atrial size was normal in size. Pericardium: There is no evidence of pericardial effusion. Mitral Valve: The mitral valve is grossly normal. Trivial mitral valve regurgitation. Tricuspid Valve: The  tricuspid valve is grossly normal. Tricuspid valve regurgitation is mild. Aortic Valve: The aortic valve is tricuspid. Aortic valve regurgitation is not visualized. Pulmonic Valve: The pulmonic valve was grossly normal. Pulmonic valve regurgitation is trivial. Aorta: The aortic root is normal in size and structure. Venous: The inferior vena cava is normal in size with greater than 50% respiratory variability, suggesting right atrial pressure  of 3 mmHg. IAS/Shunts: No atrial level shunt detected by color flow Doppler.  LEFT VENTRICLE PLAX 2D LVIDd:         4.30 cm  Diastology LVIDs:         2.68 cm  LV e' lateral:   6.42 cm/s LV PW:         0.82 cm  LV E/e' lateral: 11.1 LV IVS:        0.98 cm  LV e' medial:    5.11 cm/s LVOT diam:     2.20 cm  LV E/e' medial:  13.9 LV SV:         81 LV SV Index:   44 LVOT Area:     3.80 cm  RIGHT VENTRICLE RV Basal diam:  2.99 cm RV S prime:     21.60 cm/s TAPSE (M-mode): 3.1 cm LEFT ATRIUM             Index       RIGHT ATRIUM           Index LA diam:        3.50 cm 1.90 cm/m  RA Area:     14.20 cm LA Vol (A2C):   42.8 ml 23.21 ml/m RA Volume:   31.20 ml  16.92 ml/m LA Vol (A4C):   46.7 ml 25.33 ml/m LA Biplane Vol: 45.6 ml 24.73 ml/m  AORTIC VALVE LVOT Vmax:   124.00 cm/s LVOT Vmean:  72.900 cm/s LVOT VTI:    0.212 m  AORTA Ao Root diam: 3.00 cm MITRAL VALVE MV Area (PHT): 6.65 cm     SHUNTS MV Decel Time: 114 msec     Systemic VTI:  0.21 m MV E velocity: 71.10 cm/s   Systemic Diam: 2.20 cm MV A velocity: 113.00 cm/s MV E/A ratio:  0.63 Rozann Lesches MD Electronically signed by Rozann Lesches MD Signature Date/Time: 05/11/2020/4:35:39 PM    Final    DG ESOPHAGUS W SINGLE CM (SOL OR THIN BA)  Result Date: 05/28/2020 CLINICAL DATA:  Esophageal stent with prior leak, reassessment for leak EXAM: ESOPHOGRAM/BARIUM SWALLOW TECHNIQUE: Single contrast examination was performed using 25 cc Omnipaque 300. FLUOROSCOPY TIME:  Fluoroscopy Time:  3 minutes, 24 seconds Radiation  Exposure Index (if provided by the fluoroscopic device): 33 mGy Number of Acquired Spot Images: 0 COMPARISON:  Multiple exams, including 05/23/2020 FINDINGS: Mid to distal expandable esophageal stent noted. The prior right-sided leak from the distal margin of the stent is no longer present. Contrast medium extending around the distal margin of the stent primarily on the left side appears to be intraluminal and likely in between the stent and the wall of the distal esophagus or small hiatal hernia based on the contained appearance for example on image 26/8. This collection was noted to extend proximally along the distal half of the stent along the left side. This is not unexpected given the appearance on the chest CT of 05/24/2020, which showed some luminal gas tracking along the left side of the stent in this vicinity. Accordingly, no leak from the esophagus is currently identified. IMPRESSION: 1. No esophageal leak is currently identified. 2. A small amount of intraluminal contrast extends around the stent distally, but this is felt to be due to incomplete apposition of the stent with the lumen, rather than a leak. Electronically Signed   By: Van Clines M.D.   On: 05/28/2020 08:29   DG ESOPHAGUS W SINGLE CM (SOL OR THIN BA)  Result Date: 05/23/2020 CLINICAL  DATA:  Postop esophageal perforation 05/17/2020. EXAM: ESOPHOGRAM/BARIUM SWALLOW TECHNIQUE: Single contrast examination was performed using  water-soluble. FLUOROSCOPY TIME:  Fluoroscopy Time:  1 minutes 36 seconds Radiation Exposure Index (if provided by the fluoroscopic device): Number of Acquired Spot Images: 5 COMPARISON:  CT chest 05/16/2020. FINDINGS: In the recumbent LPO position, patient drank water soluble Omnipaque 300. There is a long esophageal wall stent centered in the mid esophagus. Leakage of contrast from the right posterolateral wall of the distal esophagus, in the region of the distal aspect of the wall stent, is seen with filling  of the right pleural space. IMPRESSION: Persistent leak from the right posterolateral aspect of the esophagus, along the distal aspect of the stent, with filling of the adjacent right pleural space. Electronically Signed   By: Lorin Picket M.D.   On: 05/23/2020 10:05   US THORACENTESIS ASP PLEURAL SPACE W/IMG GUIDE  Result Date: 05/14/2020 INDICATION: Complicated RIGHT pleural effusion EXAM: ULTRASOUND GUIDED DIAGNOSTIC RIGHT THORACENTESIS MEDICATIONS: None. COMPLICATIONS: Probable RIGHT pneumothorax, see chest radiograph report PROCEDURE: An ultrasound guided thoracentesis was thoroughly discussed with the patient and questions answered. The benefits, risks, alternatives and complications were also discussed. The patient understands and wishes to proceed with the procedure. Written consent was obtained. Ultrasound was performed to localize and mark an adequate pocket of fluid in the posterior RIGHT chest. The area was then prepped and draped in the normal sterile fashion. 1% Lidocaine was used for local anesthesia. Under ultrasound guidance a 5 [place] Yueh catheter was introduced. Thoracentesis was performed. The catheter was removed and a dressing applied. FINDINGS: A total of approximately 30 mL of cloudy yellow slightly thicker RIGHT pleural fluid was removed. Samples were sent to the laboratory as requested by the clinical team. IMPRESSION: Successful ultrasound guided diagnostic RIGHT thoracentesis yielding 30 mL of cloudy yellow RIGHT pleural fluid, cannot exclude infection/empyema with this appearance. Findings discussed with Dr. Elsworth Soho on 05/14/2020 at 1545 hours. Electronically Signed   By: Lavonia Dana M.D.   On: 05/14/2020 15:50     Time Spent in minutes  30     Desiree Hane M.D on 05/31/2020 at 2:38 PM  To page go to [email or web address] - password [email or web address]

## 2020-05-31 NOTE — Progress Notes (Addendum)
[address]       [place] [address]             [phone]      6 Days Post-Op Procedure(s) (LRB): ESOPHAGOGASTRODUODENOSCOPY (EGD); ESOPHAGEAL STENT REMOVAL. (N/A) ESOPHAGEAL STENT PLACEMENT USING A 23 MM COVERED STENT (N/A) VIDEO ASSISTED THORACOSCOPY (VATS)/EMPYEMA (Right) Subjective: Feels ok but not very talkative  Objective: Vital signs in last 24 hours: Temp:  [98.4 F (36.9 C)-100.3 F (37.9 C)] 98.5 F (36.9 C) (08/19 0734) Pulse Rate:  [104-127] 104 (08/19 0411) Cardiac Rhythm: Sinus tachycardia (08/19 0704) Resp:  [16-40] 25 (08/19 0411) BP: (133-164)/(79-90) 139/82 (08/19 0411) SpO2:  [95 %-98 %] 95 % (08/19 0411) Weight:  [60.3 kg] 60.3 kg (08/19 0411)  Hemodynamic parameters for last 24 hours:    Intake/Output from previous day: 08/18 0701 - 08/19 0700 In: 1194.4 [I.V.:29.3; NG/GT:1165.1] Out: 670 [Urine:600; Drains:10; Chest Tube:60] Intake/Output this shift: No intake/output data recorded.  General appearance: alert, cooperative, distracted and no distress Heart: regular rate and rhythm Lungs: dim right base Abdomen: benign Extremities: no edema Wound: incis healing well  Lab Results: Recent Labs    05/30/20 0102 05/31/20 0047  WBC 12.4* 14.6*  HGB 9.0* 9.4*  HCT 29.2* 30.8*  PLT 719* 704*   BMET:  Recent Labs    05/30/20 0102 05/31/20 0047  NA 134* 133*  K 4.3 5.0  CL 102 100  CO2 24 24  GLUCOSE 127* 140*  BUN 10 11  CREATININE 0.69 0.78  CALCIUM 8.3* 8.8*    PT/INR: No results for input(s): LABPROT, INR in the last 72 hours. ABG    Component Value Date/Time   PHART 7.443 05/26/2020 0335   HCO3 23.5 05/26/2020 0335   TCO2 27 05/17/2020 2036   ACIDBASEDEF 0.1 05/26/2020 0335   O2SAT 96.7 05/26/2020 0335   CBG (last 3)  Recent Labs    05/31/20 0006 05/31/20 0416 05/31/20 0738  GLUCAP 116* 146* 149*    Meds Scheduled Meds: . acetylcysteine  2 mL Nebulization TID  . bisacodyl  10 mg Oral  Daily  . feeding supplement (PROSource TF)  45 mL Per Tube BID  . insulin aspart  0-24 Units Subcutaneous TID AC & HS  . mouth rinse  15 mL Mouth Rinse q12n4p  . metoprolol tartrate  50 mg Per Tube BID  . pantoprazole (PROTONIX) IV  40 mg Intravenous Q12H  . sennosides  5 mL Per Tube QHS   Continuous Infusions: . sodium chloride 70 mL/hr at 05/16/20 2122  . sodium chloride    . sodium chloride    . feeding supplement (OSMOLITE 1.5 CAL) 1,000 mL (05/30/20 1832)  . fluconazole (DIFLUCAN) IV Stopped (05/30/20 1035)   PRN Meds:.sodium chloride, Place/Maintain arterial line **AND** sodium chloride, Place/Maintain arterial line **AND** sodium chloride, [DISCONTINUED] acetaminophen **OR** acetaminophen, fentaNYL (SUBLIMAZE) injection, hydrALAZINE, ipratropium-albuterol, metoprolol tartrate, ondansetron (ZOFRAN) IV  Xrays No results found.   Results for orders placed or performed during the hospital encounter of 05/10/20  Blood Culture (routine x 2)     Status: None   Collection Time: 05/10/20  9:11 PM   Specimen: BLOOD LEFT ARM  Result Value Ref Range Status   Specimen Description BLOOD LEFT ARM  Final   Special Requests   Final    BOTTLES DRAWN AEROBIC AND ANAEROBIC Blood Culture adequate volume   Culture   Final    NO GROWTH 5 DAYS Performed at [facility], [place]  [address], [place], [place] [address]    Report Status 05/15/2020 FINAL  Final  Blood Culture (routine x 2)     Status: None   Collection Time: 05/10/20 10:32 PM   Specimen: BLOOD  Result Value Ref Range Status   Specimen Description BLOOD LEFT ANTECUBITAL  Final   Special Requests   Final    BOTTLES DRAWN AEROBIC AND ANAEROBIC Blood Culture adequate volume   Culture   Final    NO GROWTH 5 DAYS Performed at [facility], [address] [place], [place] [address]    Report Status 05/15/2020 FINAL  Final  SARS Coronavirus 2 by RT PCR (hospital order, performed in [facility] hospital lab) Nasopharyngeal Nasopharyngeal  Swab     Status: None   Collection Time: 05/10/20 11:58 PM   Specimen: Nasopharyngeal Swab  Result Value Ref Range Status   SARS Coronavirus 2 NEGATIVE NEGATIVE Final    Comment: (NOTE) SARS-CoV-2 target nucleic acids are NOT DETECTED.  The SARS-CoV-2 RNA is generally detectable in upper and lower respiratory specimens during the acute phase of infection. The lowest concentration of SARS-CoV-2 viral copies this assay can detect is 250 copies / mL. A negative result does not preclude SARS-CoV-2 infection and should not be used as the sole basis for treatment or other patient management decisions.  A negative result may occur with improper specimen collection / handling, submission of specimen other than nasopharyngeal swab, presence of viral mutation(s) within the areas targeted by this assay, and inadequate number of viral copies (<250 copies / mL). A negative result must be combined with clinical observations, patient history, and epidemiological information.  Fact Sheet for Patients:   StrictlyIdeas.no  Fact Sheet for Healthcare Providers: BankingDealers.co.za  This test is not yet approved or  cleared by the [place] FDA and has been authorized for detection and/or diagnosis of SARS-CoV-2 by FDA under an Emergency Use Authorization (EUA).  This EUA will remain in effect (meaning this test can be used) for the duration of the COVID-19 declaration under Section 564(b)(1) of the Act, 21 U.S.C. section 360bbb-3(b)(1), unless the authorization is terminated or revoked sooner.  Performed at [facility], [address], [place], [place] [address]   Urine culture     Status: None   Collection Time: 05/11/20  9:10 AM   Specimen: Urine, Clean Catch  Result Value Ref Range Status   Specimen Description   Final    URINE, CLEAN CATCH Performed at [facility], [address], [place], [place] [address]    Special Requests   Final     NONE Performed at [facility], [address] [place], [place] [address]    Culture   Final    NO GROWTH Performed at [facility], [place] [address], [place], [place] [address]    Report Status 05/12/2020 FINAL  Final  Culture, body fluid-bottle     Status: None   Collection Time: 05/14/20  3:15 PM   Specimen: Pleura  Result Value Ref Range Status   Specimen Description PLEURAL  Final   Special Requests NONE  Final   Culture   Final    NO GROWTH 5 DAYS Performed at [facility], [address] [place], [place] [address]    Report Status 05/19/2020 FINAL  Final  Gram stain     Status: None   Collection Time: 05/14/20  3:15 PM   Specimen: Pleura  Result Value Ref Range Status   Specimen Description PLEURAL  Final  Special Requests NONE  Final   Gram Stain   Final    WBC SEEN WBC PRESENT,BOTH PMN AND MONONUCLEAR NO ORGANISMS SEEN CYTOSPIN SMEAR PERFORMED AT [facility] Performed at [facility], [address] [place], [place] [address]    Report Status 05/14/2020 FINAL  Final  MRSA PCR Screening     Status: None   Collection Time: 05/16/20  9:52 PM   Specimen: Nasopharyngeal  Result Value Ref Range Status   MRSA by PCR NEGATIVE NEGATIVE Final    Comment:        The GeneXpert MRSA Assay (FDA approved for NASAL specimens only), is one component of a comprehensive MRSA colonization surveillance program. It is not intended to diagnose MRSA infection nor to guide or monitor treatment for MRSA infections. Performed at [facility], [place] [address] [place], [place] [address]   Fungus Culture With Stain     Status: Abnormal   Collection Time: 05/17/20  4:54 PM   Specimen: PATH Other; Tissue  Result Value Ref Range Status   Fungus Stain Final report  Final    Comment: CRITICAL RESULT CALLED TO, READ BACK BY AND VERIFIED WITH: D FIELDS 05/29/20 2119 JDW    Fungus (Mycology) Culture Preliminary report (A)  Final    Comment: (NOTE) REPORTED POSITIVE RESULT TO Shanna Cisco AT 21:10 ON 05/29/2020.  FAXED TO [phone] Performed At: [facility] [address] [place], [place] [id number] Rush Farmer MD NI:[id number]    Fungal Source TISSUE  Final    Comment: EMPYEMA RIGHT CHEST Performed at [facility], [place] [address], [place], [place] [address]   Aerobic/Anaerobic Culture (surgical/deep wound)     Status: None   Collection Time: 05/17/20  4:54 PM   Specimen: PATH Other; Tissue  Result Value Ref Range Status   Specimen Description TISSUE EMPYEMA RIGHT CHEST  Final   Special Requests NONE  Final   Gram Stain NO WBC SEEN NO ORGANISMS SEEN   Final   Culture   Final    RARE CANDIDA PARAPSILOSIS CRITICAL RESULT CALLED TO, READ BACK BY AND VERIFIED WITH: RN [facility] [phone] FCP NO ANAEROBES ISOLATED Performed at [facility], [place] [address], [place], [place] [address]    Report Status 05/22/2020 FINAL  Final  Acid Fast Smear (AFB)     Status: None   Collection Time: 05/17/20  4:54 PM   Specimen: PATH Other; Tissue  Result Value Ref Range Status   AFB Specimen Processing Comment  Final    Comment: Tissue Grinding and Digestion/Decontamination   Acid Fast Smear Negative  Final    Comment: (NOTE) Performed At: [facility] [place], [place] [id number] Rush Farmer MD [id number]    Source (AFB) TISSUE  Final    Comment: EMPYEMA RIGHT CHEST Performed at [facility], [place] [address], [place], [place] [address]   Fungus Culture Result     Status: None   Collection Time: 05/17/20  4:54 PM  Result Value Ref Range Status   Result 1 Comment  Final    Comment: (NOTE) KOH/Calcofluor preparation:  no fungus observed. Performed At: [facility] [place], [place] [id number] Rush Farmer MD YP:[id number]   Fungal organism reflex     Status: Abnormal   Collection Time: 05/17/20  4:54 PM  Result Value Ref Range Status   Fungal result 1 Comment (A)  Final     Comment: (NOTE) Yeast isolated, identification in progress.  Performed At: [facility] [place], [place] [id number] Rush Farmer MD AS:[id number]   Aerobic/Anaerobic Culture (surgical/deep wound)     Status: None   Collection Time: 05/25/20  9:03 AM   Specimen: PATH Other; Body Fluid  Result Value Ref Range Status   Specimen Description FLUID  Final   Special Requests EMPYEMA SPEC A  Final   Gram Stain   Final    ABUNDANT WBC PRESENT, PREDOMINANTLY PMN NO ORGANISMS SEEN    Culture   Final    RARE CANDIDA PARAPSILOSIS NO ANAEROBES ISOLATED Performed at [facility], [place] [address], [place], [place] [address]    Report Status 05/30/2020 FINAL  Final    Assessment/Plan: S/P Procedure(s) (LRB): ESOPHAGOGASTRODUODENOSCOPY (EGD); ESOPHAGEAL STENT REMOVAL. (N/A) ESOPHAGEAL STENT PLACEMENT USING A 23 MM COVERED STENT (N/A) VIDEO ASSISTED THORACOSCOPY (VATS)/EMPYEMA (Right)  1 steady slow progress VSS except a little tachy, sinus tach, no further afib, DOAC at d/c if becomes a candidate 2 clear liquids to start today 3 tmax 100.3, on diflucan for yeast growth, finished zosyn yesterday, vanco 8/17 WBC up a little to 14.6 4 CT 60 cc yesterday- continue 5 H/H pretty stable- monitor clinically 6 normal renal fxn 7 LFT's elevated a bit but stable 8 BS adeq control on TF's 9 cont pulm RX/toilet- sats good on RA 10 delerium, prev CVA- cont therapies, plan for SNF at d/c, stable for lovenox? For DVT PPX  LOS: 20 days    John Giovanni PA-C Pager [phone] 05/31/2020  Agree with above. Started on clear liquids today If chest tube output is stable we will remove [place] chest tube tomorrow. Dispo planning.  Doneta Bayman Bary Leriche

## 2020-05-31 NOTE — Plan of Care (Signed)

## 2020-06-01 ENCOUNTER — Inpatient Hospital Stay (HOSPITAL_COMMUNITY): Payer: Medicaid Other

## 2020-06-01 DIAGNOSIS — R509 Fever, unspecified: Secondary | ICD-10-CM

## 2020-06-01 LAB — COMPREHENSIVE METABOLIC PANEL
ALT: 167 U/L — ABNORMAL HIGH (ref 0–44)
AST: 57 U/L — ABNORMAL HIGH (ref 15–41)
Albumin: 1.9 g/dL — ABNORMAL LOW (ref 3.5–5.0)
Alkaline Phosphatase: 266 U/L — ABNORMAL HIGH (ref 38–126)
Anion gap: 9 (ref 5–15)
BUN: 14 mg/dL (ref 6–20)
CO2: 22 mmol/L (ref 22–32)
Calcium: 8.6 mg/dL — ABNORMAL LOW (ref 8.9–10.3)
Chloride: 100 mmol/L (ref 98–111)
Creatinine, Ser: 0.79 mg/dL (ref 0.61–1.24)
GFR calc Af Amer: >60 mL/min (ref >60)
GFR calc non Af Amer: >60 mL/min (ref >60)
Glucose, Bld: 158 mg/dL — ABNORMAL HIGH (ref 70–99)
Potassium: 4.4 mmol/L (ref 3.5–5.1)
Sodium: 131 mmol/L — ABNORMAL LOW (ref 135–145)
Total Bilirubin: 0.5 mg/dL (ref 0.3–1.2)
Total Protein: 7.4 g/dL (ref 6.5–8.1)

## 2020-06-01 LAB — CBC WITH DIFFERENTIAL/PLATELET
Abs Immature Granulocytes: 0.1 10*3/uL — ABNORMAL HIGH (ref 0.00–0.07)
Basophils Absolute: 0.1 10*3/uL (ref 0.0–0.1)
Basophils Relative: 0 %
Eosinophils Absolute: 0 10*3/uL (ref 0.0–0.5)
Eosinophils Relative: 0 %
HCT: 30.8 % — ABNORMAL LOW (ref 39.0–52.0)
Hemoglobin: 9.4 g/dL — ABNORMAL LOW (ref 13.0–17.0)
Immature Granulocytes: 1 %
Lymphocytes Relative: 6 %
Lymphs Abs: 0.9 10*3/uL (ref 0.7–4.0)
MCH: 26.1 pg (ref 26.0–34.0)
MCHC: 30.5 g/dL (ref 30.0–36.0)
MCV: 85.6 fL (ref 80.0–100.0)
Monocytes Absolute: 0.8 10*3/uL (ref 0.1–1.0)
Monocytes Relative: 5 %
Neutro Abs: 13.5 10*3/uL — ABNORMAL HIGH (ref 1.7–7.7)
Neutrophils Relative %: 88 %
Platelets: 739 10*3/uL — ABNORMAL HIGH (ref 150–400)
RBC: 3.6 MIL/uL — ABNORMAL LOW (ref 4.22–5.81)
RDW: 14.8 % (ref 11.5–15.5)
WBC: 15.3 10*3/uL — ABNORMAL HIGH (ref 4.0–10.5)
nRBC: 0.1 % (ref 0.0–0.2)

## 2020-06-01 LAB — GLUCOSE, CAPILLARY
Glucose-Capillary: 129 mg/dL — ABNORMAL HIGH (ref 70–99)
Glucose-Capillary: 149 mg/dL — ABNORMAL HIGH (ref 70–99)
Glucose-Capillary: 147 mg/dL — ABNORMAL HIGH (ref 70–99)
Glucose-Capillary: 125 mg/dL — ABNORMAL HIGH (ref 70–99)
Glucose-Capillary: 113 mg/dL — ABNORMAL HIGH (ref 70–99)
Glucose-Capillary: 158 mg/dL — ABNORMAL HIGH (ref 70–99)

## 2020-06-01 NOTE — Progress Notes (Addendum)
[address]       [place] [address]             [phone]      7 Days Post-Op Procedure(s) (LRB): ESOPHAGOGASTRODUODENOSCOPY (EGD); ESOPHAGEAL STENT REMOVAL. (N/A) ESOPHAGEAL STENT PLACEMENT USING A 23 MM COVERED STENT (N/A) VIDEO ASSISTED THORACOSCOPY (VATS)/EMPYEMA (Right) Subjective: Feels ok, more alert and responsive this am  Objective: Vital signs in last 24 hours: Temp:  [98.4 F (36.9 C)-102 F (38.9 C)] 99.1 F (37.3 C) (08/20 0328) Pulse Rate:  [104-133] 117 (08/20 0328) Cardiac Rhythm: Sinus tachycardia (08/20 0328) Resp:  [22-32] 24 (08/20 0328) BP: (118-151)/(72-95) 145/95 (08/20 0328) SpO2:  [94 %-99 %] 95 % (08/20 0328) Weight:  [59.1 kg] 59.1 kg (08/20 0328)  Hemodynamic parameters for last 24 hours:    Intake/Output from previous day: 08/19 0701 - 08/20 0700 In: 1330.6 [P.O.:50; NG/GT:1280.6] Out: 1215 [Urine:1200; Drains:5; Chest Tube:10] Intake/Output this shift: No intake/output data recorded.  General appearance: alert, cooperative, distracted and no distress Heart: regular rate and rhythm and tachy Lungs: dim right lower fields Abdomen: soft, nontender Extremities: no edema or calf tenderness Wound: incis ok  Lab Results: Recent Labs    05/31/20 0047 06/01/20 0019  WBC 14.6* 15.3*  HGB 9.4* 9.4*  HCT 30.8* 30.8*  PLT 704* 739*   BMET:  Recent Labs    05/31/20 0047 06/01/20 0019  NA 133* 131*  K 5.0 4.4  CL 100 100  CO2 24 22  GLUCOSE 140* 158*  BUN 11 14  CREATININE 0.78 0.79  CALCIUM 8.8* 8.6*    PT/INR: No results for input(s): LABPROT, INR in the last 72 hours. ABG    Component Value Date/Time   PHART 7.443 05/26/2020 0335   HCO3 23.5 05/26/2020 0335   TCO2 27 05/17/2020 2036   ACIDBASEDEF 0.1 05/26/2020 0335   O2SAT 96.7 05/26/2020 0335   CBG (last 3)  Recent Labs    05/31/20 2335 05/31/20 2357 06/01/20 0329  GLUCAP 139* 153* 129*    Meds Scheduled Meds: . bisacodyl  10 mg  Oral Daily  . feeding supplement  1 Container Oral BID BM  . feeding supplement (PROSource TF)  45 mL Per Tube BID  . insulin aspart  0-24 Units Subcutaneous TID AC & HS  . mouth rinse  15 mL Mouth Rinse q12n4p  . metoprolol tartrate  50 mg Per Tube BID  . pantoprazole (PROTONIX) IV  40 mg Intravenous Q12H  . sennosides  5 mL Per Tube QHS   Continuous Infusions: . sodium chloride 70 mL/hr at 05/16/20 2122  . sodium chloride    . sodium chloride    . feeding supplement (OSMOLITE 1.5 CAL) 1,000 mL (05/31/20 1353)  . fluconazole (DIFLUCAN) IV 400 mg (05/31/20 0907)   PRN Meds:.sodium chloride, Place/Maintain arterial line **AND** sodium chloride, Place/Maintain arterial line **AND** sodium chloride, [DISCONTINUED] acetaminophen **OR** acetaminophen, fentaNYL (SUBLIMAZE) injection, hydrALAZINE, ipratropium-albuterol, metoprolol tartrate, ondansetron (ZOFRAN) IV  Xrays No results found.  Assessment/Plan: S/P Procedure(s) (LRB): ESOPHAGOGASTRODUODENOSCOPY (EGD); ESOPHAGEAL STENT REMOVAL. (N/A) ESOPHAGEAL STENT PLACEMENT USING A 23 MM COVERED STENT (N/A) VIDEO ASSISTED THORACOSCOPY (VATS)/EMPYEMA (Right)  1 appears stable, BP control is pretty good, + tachycardic- cont metoprolol- multifactorial, no further afib noted 2 sats good on RA 3 renal fxn is stable with adeq UOP 4 significant protein malnutrition with albumin 1.9, LFT's improving trend 5 CT  sero-purulent, cont to leave in place 6 slow increase in WBC trend, tmax  102, may need to resume some broad spectrum coverage - consider culture of drainage 7 starting to increase clears intake 8 conts therapies as able 9 poss CIR v SNF- SNF would probably need to be long term as he is unable to care for self and mother is elderly  LOS: 21 days    John Giovanni PA-C Pager [phone] 06/01/2020  Agree with above. We will remove [place] chest tube, keep the [facility] drain. Continue tube feeds Continue physical therapy Dispo  planning.  Marca Gadsby Bary Leriche

## 2020-06-01 NOTE — Plan of Care (Signed)

## 2020-06-01 NOTE — Progress Notes (Signed)
   05/31/20 1955  Assess: MEWS Score  Temp 98.6 F (37 C)  BP (!) 139/93  Pulse Rate (!) 129  ECG Heart Rate (!) 129  Resp (!) 27  Level of Consciousness Alert  SpO2 95 %  O2 Device Room Air  Patient Activity (if Appropriate) In bed  Assess: MEWS Score  MEWS Temp 0  MEWS Systolic 0  MEWS Pulse 2  MEWS RR 2  MEWS LOC 0  MEWS Score 4  MEWS Score Color Red  Assess: if the MEWS score is Yellow or Red  Were vital signs taken at a resting state? Yes  Focused Assessment No change from prior assessment  Early Detection of Sepsis Score *See Row Information* Low  MEWS guidelines implemented *See Row Information* Yes  Treat  MEWS Interventions Administered scheduled meds/treatments;Administered prn meds/treatments;Escalated (See documentation below)  Pain Scale 0-10  Pain Score 0  Take Vital Signs  Increase Vital Sign Frequency  Red: Q 1hr X 4 then Q 4hr X 4, if remains red, continue Q 4hrs  Escalate  MEWS: Escalate Red: discuss with charge nurse/RN and provider, consider discussing with RRT  Notify: Charge Nurse/RN  Name of Charge Nurse/RN Notified Janett Billow RN  Date Charge Nurse/RN Notified 05/31/20  Time Charge Nurse/RN Notified 2005  Document  Patient Outcome Not stable and remains on department  Progress note created (see row info) Yes

## 2020-06-01 NOTE — Progress Notes (Signed)
[facility]  PROGRESS NOTE  Elwin Tsou Tadesse WGY:[id number] DOB: 1961/08/16 DOA: 05/10/2020 PCP: Rosita Fire, MD Admit date - 05/10/2020   Admitting Physician Lajuana Matte, MD  Outpatient Primary MD for the patient is Rosita Fire, MD  LOS - 21 Brief Narrative   Evan Chavez is a 59 y.o. year old male with medical history significant for GERD, Barrett's esophagus, peptic ulcer disease with extensive gastric erosion, duodenal ulcerand esophageal stricture status post balloon dilatation on 12/2018, also with history of prior stroke, hyperlipidemia, BPH, remote cocaine abuse. who presented on 05/10/2020 to [facility] with generalized weakness, productive cough and low-grade fever shortness of breath initially admitted for pneumonia based off CTA chest showing bibasilar consolidation pleural effusions treated with ceftriaxone and azithromycin. Hospital course complicated by right-sided hydropneumothorax with possible lung collapse after right thoracentesis on 8/2, esophageal perforation (CT chest on 8/4)With large right-sided hydropneumothorax, complicated by aspiration pneumonia.  Patient underwent right-sided chest tube placement was transferred from [facility] to [facility] where he underwent VATS and decortication by CT surgery as well as esophageal stent placement and PEG tube placement on 8/4-8/5.  Patient has been managed on hospitalist service since.  Did have episodes of persistent leakage from esophagus on esophagram as well as requiring right thorascopic for drainage of loculated pleural effusion on 8/13 by CT surgery,  repeat esophagram on 8/16 did not show any esophageal leak. Currently hospitalist service is following as a consult and CT surgery is primary team  Subjective  Today he has no acute complaints  A & P   Right base atelectasis/infiltrate, persistent on chest x-ray from 8/17.  No new O2 requirements, did become febrile overnight, Has slowly trended  from 14-15.  Patient previously on empiric antibiotics with vancomycin(8/12-8/17) and Zosyn (8/3-8/18) -Obtain blood cultures -Follow-up chest x-ray -Monitor CBC, fever/temp trend -Encourage incentive spirometry  Acute hypoxic respiratory failure of multifactorial etiology (bibasilar pneumonia, loculated parapneumonic effusion/complex on right, suspect ongoing left, right apical pneumothorax). Stable respiratory status on room air -CT surgery plans to remove chest tube today -Currently on fluconazole (8/5-for Candida Parapsilosis on pleural effusion studies (05/17/20)  Sepsis secondary to aspiration pneumonia and mediastinitis from prior esophageal rupture, sepsis physiology now resolved.  In setting of esophageal rupture.  Did have T-max of 102 with, slight increase in leukocytosis overnight but clinically remained stable  -Monitor repeat blood cultures, follow-up chest x-ray, currently does not have any localizing signs or symptoms of infection -Monitor CBC  Esophageal rupture leading to right-sided hydropneumothorax and right-sided complex loculated parapneumonic effusion.  Minimal output from chest tube -Chest tube to be removed per CT surgery on 8/20, follow-up post removal chest x-ray --Continue Mucomyst, flutter valve, chest PT-  N.p.o. status in the setting of esophageal rupture -Continue tube feeds -Tolerating clear liquid diet start on 8/19 per CT surgery   Acute on chronic blood loss anemia, stable.  In the setting of blood loss from esophageal rupture and surgery as well as hematemesis during hospitalization.  Hemoglobin currently stable -Monitor CBC  Hypophosphatemia, resolved. Improved with IV supplementation  Impaired mobility/generalized weakness -PT recommends SNF -Social worker consulted -Monitor CBC, transfuse if hemoglobin less than 7  Intermittent delirium, resolved.  Likely hospital-acquired given prolonged course.  Currently alert and oriented to place, self,  time, context no longer restless or agitated. -Delirium precautions -As needed IV Haldol has been helpful   Paroxysmal atrial fibrillation.  No longer A. fib.   --Cardiology advised discontinuing diltiazem, Continuing metoprolol 50 mg  twice daily as well as DOAC when he is able to tolerate  Sinus tachycardia, mild with heart rate in the low 100s not A. fib on my review of telemetry, suspect related to dehydration due to water deficits.  TSH within normal limits, hemoglobin stable -Monitor hydration Clear liquid diet, expect improvement with increase oral hydration -Continue to monitor on telemetry  Prior CVA prior to admission -Holding home aspirin 325 - will likely resume Lipitor on discharge, continue to monitor LFTs  Elevated LFTs, continue to downtrend Denies any abdominal pain.  Hepatitis panel unremarkable -Continue to hold home statins  Left hydronephrosis.  Noted on CT scan.  As well as 9 mm obstructing proximal left ureteral stone  urology was consulted during hospitalization and recommended continue original plan of shockwave lithotripsy. this was deferred during current hospitalization due to respiratory status -Urology will arrange close follow-up to get this scheduled,        Family Communication  :  none  Code Status :  FULL  Disposition Plan  :  Patient is from home. Anticipated d/c date: 2 to 3 days. Barriers to d/c or necessity for inpatient status:  Discharge to SNF once medically stable, still needing final disposition for anticoagulation--cardiology recommending DOAC at discharge if able to tolerate,    Procedures  :    DVT Prophylaxis  :  SCDs   Lab Results  Component Value Date   PLT 739 (H) 06/01/2020    Diet :  Diet Order            Diet clear liquid Room service appropriate? Yes; Fluid consistency: Thin  Diet effective now                  Inpatient Medications Scheduled Meds: . bisacodyl  10 mg Oral Daily  . feeding supplement  1  Container Oral BID BM  . feeding supplement (PROSource TF)  45 mL Per Tube BID  . insulin aspart  0-24 Units Subcutaneous TID AC & HS  . mouth rinse  15 mL Mouth Rinse q12n4p  . metoprolol tartrate  50 mg Per Tube BID  . pantoprazole (PROTONIX) IV  40 mg Intravenous Q12H  . sennosides  5 mL Per Tube QHS   Continuous Infusions: . sodium chloride 70 mL/hr at 05/16/20 2122  . sodium chloride    . sodium chloride    . feeding supplement (OSMOLITE 1.5 CAL) 1,000 mL (06/01/20 0942)  . fluconazole (DIFLUCAN) IV 400 mg (06/01/20 0937)   PRN Meds:.sodium chloride, Place/Maintain arterial line **AND** sodium chloride, Place/Maintain arterial line **AND** sodium chloride, [DISCONTINUED] acetaminophen **OR** acetaminophen, fentaNYL (SUBLIMAZE) injection, hydrALAZINE, ipratropium-albuterol, metoprolol tartrate, ondansetron (ZOFRAN) IV  Antibiotics  :   Anti-infectives (From admission, onward)   Start     Dose/Rate Route Frequency Ordered Stop   05/29/20 1800  vancomycin (VANCOCIN) IVPB 1000 mg/200 mL premix  Status:  Discontinued        1,000 mg 200 mL/hr over 60 Minutes Intravenous Every 12 hours 05/29/20 1151 05/30/20 0912   05/24/20 2100  vancomycin (VANCOREADY) IVPB 750 mg/150 mL  Status:  Discontinued        750 mg 150 mL/hr over 60 Minutes Intravenous Every 12 hours 05/24/20 0813 05/29/20 1151   05/24/20 0900  vancomycin (VANCOREADY) IVPB 1500 mg/300 mL        1,500 mg 150 mL/hr over 120 Minutes Intravenous  Once 05/24/20 0813 05/24/20 1221   05/17/20 0900  fluconazole (DIFLUCAN) IVPB 400 mg  400 mg 100 mL/hr over 120 Minutes Intravenous Every 24 hours 05/17/20 0749     05/17/20 0600  vancomycin (VANCOREADY) IVPB 750 mg/150 mL  Status:  Discontinued        750 mg 150 mL/hr over 60 Minutes Intravenous Every 8 hours 05/16/20 2154 05/17/20 0903   05/17/20 0400  vancomycin (VANCOREADY) IVPB 750 mg/150 mL  Status:  Discontinued        750 mg 150 mL/hr over 60 Minutes Intravenous Every  8 hours 05/16/20 1945 05/16/20 2154   05/16/20 2200  vancomycin (VANCOREADY) IVPB 1250 mg/250 mL        1,250 mg 166.7 mL/hr over 90 Minutes Intravenous STAT 05/16/20 2153 05/16/20 2348   05/16/20 2000  vancomycin (VANCOREADY) IVPB 1250 mg/250 mL  Status:  Discontinued        1,250 mg 166.7 mL/hr over 90 Minutes Intravenous  Once 05/16/20 1945 05/16/20 2153   05/15/20 1600  piperacillin-tazobactam (ZOSYN) IVPB 3.375 g  Status:  Discontinued        3.375 g 12.5 mL/hr over 240 Minutes Intravenous Every 8 hours 05/15/20 0945 05/30/20 0912   05/15/20 0930  piperacillin-tazobactam (ZOSYN) IVPB 3.375 g        3.375 g 100 mL/hr over 30 Minutes Intravenous  Once 05/15/20 0921 05/15/20 1009   05/13/20 2000  azithromycin (ZITHROMAX) tablet 500 mg        500 mg Oral Daily 05/13/20 1155 05/14/20 2127   05/10/20 2200  cefTRIAXone (ROCEPHIN) 2 g in sodium chloride 0.9 % 100 mL IVPB  Status:  Discontinued        2 g 200 mL/hr over 30 Minutes Intravenous Every 24 hours 05/10/20 2148 05/15/20 0911   05/10/20 2200  azithromycin (ZITHROMAX) 500 mg in sodium chloride 0.9 % 250 mL IVPB  Status:  Discontinued        500 mg 250 mL/hr over 60 Minutes Intravenous Every 24 hours 05/10/20 2148 05/13/20 1155       Objective   Vitals:   06/01/20 0700 06/01/20 0826 06/01/20 0942 06/01/20 1117  BP:      Pulse:  (!) 122 (!) 129   Resp:  (!) 47    Temp: 98.4 F (36.9 C)   99 F (37.2 C)  TempSrc: Oral   Oral  SpO2:  95%    Weight:      Height:        SpO2: 95 % O2 Flow Rate (L/min): 4 L/min FiO2 (%): 21 %  Wt Readings from Last 3 Encounters:  06/01/20 59.1 kg  05/09/20 69.4 kg  12/13/19 68.2 kg     Intake/Output Summary (Last 24 hours) at 06/01/2020 1328 Last data filed at 06/01/2020 0332 Gross per 24 hour  Intake 1330.59 ml  Output 565 ml  Net 765.59 ml    Physical Exam:     Awake Alert, Oriented X 3, flat affect Normal respiratory effort on room air, diminished breath sounds on right  side with chest tube in place with minimal output Abdomen soft, nondistended, PEG tube in place Tachycardic, normal rhythm,No Gallops,Rubs or new Murmurs,     I have personally reviewed the following:   Data Reviewed:  CBC Recent Labs  Lab 05/28/20 0107 05/29/20 0033 05/30/20 0102 05/31/20 0047 06/01/20 0019  WBC 20.3* 22.2* 12.4* 14.6* 15.3*  HGB 10.5* 8.6* 9.0* 9.4* 9.4*  HCT 32.7* 27.8* 29.2* 30.8* 30.8*  PLT 545* 653* 719* 704* 739*  MCV 86.3 86.9 88.0 86.3 85.6  MCH 27.7  26.9 27.1 26.3 26.1  MCHC 32.1 30.9 30.8 30.5 30.5  RDW 15.0 15.0 14.9 14.8 14.8  LYMPHSABS 1.1 1.2 0.9 0.9 0.9  MONOABS 1.4* 1.3* 0.7 0.8 0.8  EOSABS 0.0 0.2 0.4 0.1 0.0  BASOSABS 0.1 0.1 0.1 0.1 0.1    Chemistries  Recent Labs  Lab 05/28/20 0107 05/29/20 0033 05/30/20 0102 05/31/20 0047 06/01/20 0019  NA 136 136 134* 133* 131*  K 4.3 4.3 4.3 5.0 4.4  CL 105 106 102 100 100  CO2 21* 20* 24 24 22   GLUCOSE 141* 140* 127* 140* 158*  BUN 10 12 10 11 14   CREATININE 0.82 0.76 0.69 0.78 0.79  CALCIUM 8.0* 8.1* 8.3* 8.8* 8.6*  MG 2.1  --   --   --   --   AST 65* 37 61* 67* 57*  ALT 247* 157* 172* 198* 167*  ALKPHOS 252* 210* 233* 250* 266*  BILITOT 0.3 0.4 0.4 0.3 0.5   ------------------------------------------------------------------------------------------------------------------ No results for input(s): CHOL, HDL, LDLCALC, TRIG, CHOLHDL, LDLDIRECT in the last 72 hours.  Lab Results  Component Value Date   HGBA1C 5.5 07/12/2018   ------------------------------------------------------------------------------------------------------------------ No results for input(s): TSH, T4TOTAL, T3FREE, THYROIDAB in the last 72 hours.  Invalid input(s): FREET3 ------------------------------------------------------------------------------------------------------------------ No results for input(s): VITAMINB12, FOLATE, FERRITIN, TIBC, IRON, RETICCTPCT in the last 72 hours.  Coagulation profile No  results for input(s): INR, PROTIME in the last 168 hours.  No results for input(s): DDIMER in the last 72 hours.  Cardiac Enzymes No results for input(s): CKMB, TROPONINI, MYOGLOBIN in the last 168 hours.  Invalid input(s): CK ------------------------------------------------------------------------------------------------------------------    Component Value Date/Time   BNP 68.0 05/14/2020 0604    Micro Results Recent Results (from the past 240 hour(s))  Aerobic/Anaerobic Culture (surgical/deep wound)     Status: None   Collection Time: 05/25/20  9:03 AM   Specimen: PATH Other; Body Fluid  Result Value Ref Range Status   Specimen Description FLUID  Final   Special Requests EMPYEMA SPEC A  Final   Gram Stain   Final    ABUNDANT WBC PRESENT, PREDOMINANTLY PMN NO ORGANISMS SEEN    Culture   Final    RARE CANDIDA PARAPSILOSIS NO ANAEROBES ISOLATED Performed at [facility], [place] [address] [place], [place] [address]    Report Status 05/30/2020 FINAL  Final  Culture, blood (routine x 2)     Status: None (Preliminary result)   Collection Time: 06/01/20 10:28 AM   Specimen: BLOOD LEFT ARM  Result Value Ref Range Status   Specimen Description BLOOD LEFT ARM  Final   Special Requests   Final    BOTTLES DRAWN AEROBIC ONLY Performed at [facility], [place] [address], [place], [place] [address]    Culture PENDING  Incomplete   Report Status PENDING  Incomplete    Radiology Reports DG Chest 1 View  Result Date: 05/26/2020 CLINICAL DATA:  Esophageal anastomotic leak EXAM: CHEST  1 VIEW COMPARISON:  05/25/2020 FINDINGS: Right chest tubes and esophageal stent are again noted. There is new near total opacification of the right hemithorax. Right pleural effusion is present. Left lung remains clear. No pneumothorax. Cardiomediastinal contours are partially obscured. There is mild rightward mediastinal shift. IMPRESSION: New near total opacification of the right hemithorax  likely reflecting combination of effusion and atelectasis. Electronically Signed   By: Macy Mis M.D.   On: 05/26/2020 08:21   DG Chest 1 View  Result Date: 05/25/2020 CLINICAL DATA:  Esophageal stent placement. EXAM: DG C-ARM 1-60 MIN; CHEST  1 VIEW FLUOROSCOPY TIME:  Fluoro time: 4 Minutes 17 secondsRadiation exposure index: MGy: 41.69Number of acquired spot images: Five spot fluoro graphic images. COMPARISON:  May 17, 2020.  May 23, 2020 FINDINGS: Spot fluoroscopy images were obtained for surgical planning purposes. Esophageal stent is seen traversing the thorax. Enteric contrast traverses the stent and enters the proximal stomach. None of the provided radiographs demonstrate evaluation of the site of previous leak into the RIGHT pleural space. Selective LEFT mainstem intubation. IMPRESSION: Spot fluoroscopy images from surgical planning purposes. Please reference procedure report for further details. Electronically Signed   By: Valentino Saxon MD   On: 05/25/2020 10:59   DG Chest 1 View  Result Date: 05/17/2020 CLINICAL DATA:  Surgery. Esophageal stent placement. EXAM: DG C-ARM 1-60 MIN; CHEST  1 VIEW FLUOROSCOPY TIME:  Fluoroscopy Time:  5 minutes 48 seconds Radiation Exposure Index (if provided by the fluoroscopic device): 59.06 mGy Number of Acquired Spot Images: 3 COMPARISON:  Preprocedural CT yesterday. FINDINGS: Three fluoroscopic spot views obtained in the operating room. Endoscope is in place. A stent overlies the endoscope with tip projecting over the esophageal diaphragmatic hiatus. Subsequent enteric tube courses in the stent. There is contrast opacifying the distal stent on image 1. Right chest tube is in place. IMPRESSION: Intraoperative fluoroscopy during esophageal stent placement. Please reference procedural report for details. Electronically Signed   By: Keith Rake M.D.   On: 05/17/2020 19:55   DG Chest 1 View  Result Date: 05/14/2020 CLINICAL DATA:  Complicated  RIGHT pleural effusion post diagnostic thoracentesis EXAM: CHEST  1 VIEW COMPARISON:  05/10/2020 FINDINGS: Enlargement of cardiac silhouette. Mediastinal contours and pulmonary vascularity normal. Persistent RIGHT pleural effusion and significant atelectasis and/or consolidation of the mid to lower RIGHT lung. New lucency at the RIGHT apex, suspect representing apical pneumothorax. No mediastinal shift. Patient asymptomatic. Persistent infiltrate LEFT lower lobe and tiny LEFT pleural effusion. IMPRESSION: Probable RIGHT apical pneumothorax post diagnostic thoracentesis. Persistent complicated partially loculated RIGHT pleural effusion, RIGHT basilar atelectasis versus consolidation, and LEFT lower lobe consolidation. Patient asymptomatic. Findings discussed with Dr. Elsworth Soho on 05/14/2020 at 1545 hours. Electronically Signed   By: Lavonia Dana M.D.   On: 05/14/2020 15:51   CT CHEST WO CONTRAST  Result Date: 05/16/2020 CLINICAL DATA:  Pleural effusion. EXAM: CT CHEST WITHOUT CONTRAST TECHNIQUE: Multidetector CT imaging of the chest was performed following the standard protocol without IV contrast. COMPARISON:  CT dated May 10, 2020 FINDINGS: Cardiovascular: The heart size is stable. There is some shift of the mediastinum to the left. There is a trace pericardial effusion. Mild atherosclerotic changes are noted of the thoracic aorta. Mediastinum/Nodes: --mild mediastinal adenopathy is noted -- there is mild bilateral hilar adenopathy. -- No axillary lymphadenopathy. -- No supraclavicular lymphadenopathy. --there is a left-sided thyroid nodule measuring approximately 2.4 cm. -the esophagus is diffusely thickened. There appears to be a defect in the right lateral aspect of the mid to distal esophagus (axial series 3, image 82-83). Adjacent to this defect is extraluminal oral contrast tracking into the right pleural space. Lungs/Pleura: There is been interval placement of a right-sided chest tube. There is a large  right-sided hydropneumothorax which has increased in size since the patient's prior study despite placement of a right-sided chest tube. Additionally. The pleural effusion component appears more complex than prior study. There is new extensive debris within bronchus intermedius as well as the right lower lobe and right middle lobe  bronchi consistent with significant interval aspiration. The right middle lobe and right lower lobe are essentially collapsed. There are hypoattenuating components within the right middle and right lower lobes concerning for developing aspiration pneumonia. There are new areas of consolidation involving the left lower and left upper lobes concerning for aspiration. There is a small to moderate-sized left-sided pleural effusion. The chest tube is kinked at its distal most aspect. Upper Abdomen: There is no definite acute abnormality in the upper abdomen. Musculoskeletal: No chest wall abnormality. No bony spinal canal stenosis. IMPRESSION: 1. Overall findings consistent with esophageal perforation arising from the right lateral aspect of the mid to distal esophagus as detailed above. There is extraluminal oral contrast tracking from this esophageal wall defect into the right pleural space. 2. Large right-sided hydropneumothorax with significant interval worsening since the prior study despite right-sided chest tube placement. Additionally, the fluid component appears more complex. The right-sided chest tube appears to enter the thoracic cavity inferiorly and courses along the right hemidiaphragm and terminates in the region of the right epicardial fat pad. The tube is kinked at its tip. While the tube is likely intrathoracic, it will need significant repositioning to be effective. 3. Findings consistent with interval aspiration with complete occlusion of bronchus intermedius as well as the right middle and right lower lobe bronchi. There is likely developing aspiration pneumonia within the  collapsed right middle and right lower lobes as well as the posterior left upper and left lower lobes. There is a small left-sided pleural effusion. 4. Persistent diffuse esophageal wall thickening of unknown clinical significance. Correlation with endoscopy is recommended as an underlying mass is not excluded. 5. Again noted is a left-sided thyroid nodule measuring approximately 2.4 cm. Outpatient thyroid ultrasound follow-up is recommended.(Ref: J Am Coll Radiol. 2015 Feb;12(2): 143-50). These results were called by telephone at the time of interpretation on 05/16/2020 at 6:04 pm to provider [facility] , who verbally acknowledged these results. Electronically Signed   By: Constance Holster M.D.   On: 05/16/2020 18:04   CT CHEST W CONTRAST  Result Date: 05/24/2020 CLINICAL DATA:  Esophageal perforation, progressive leukocytosis, abnormal chest x-ray, dyspnea EXAM: CT CHEST WITH CONTRAST TECHNIQUE: Multidetector CT imaging of the chest was performed during intravenous contrast administration. CONTRAST:  2mL OMNIPAQUE IOHEXOL 350 MG/ML SOLN COMPARISON:  Chest radiograph 05/22/2020, CT 05/10/2020 FINDINGS: Cardiovascular: Extensive coronary artery calcification. Global cardiac size within normal limits. Trace pericardial fluid is likely physiologic in is stable since prior examination. Thoracic aorta is age-appropriate. Mediastinum/Nodes: And endoluminal stent graft has been placed within the esophagus extending from the level of the sternal notch within the proximal esophagus through the mid to distal esophagus at the level of the left atrium. The stent graft is not completely appose the esophageal lumen proximally, best seen on axial image # 27/3. Additionally, there is a defect involving the right posterolateral aspect of the esophagus, best seen on axial image # 77/3, with an adjacent loculated collection of gas, fluid, and extravasated contrast measuring 4.2 x 7.6 x 8.4 cm on axial image # 82 and sagittal  image # 77. The distal margin of the stent graft does appear to completely apposed the esophageal lumen, though this is extremely close to the point of perforation. The distal esophagus again demonstrates marked circumferential wall thickening in keeping with esophagitis. Numerous shotty enhancing right paratracheal lymph nodes are identified, likely reactive. No pathologic thoracic adenopathy. Largely cystic nodule again identified within the left thyroid lobe, unchanged. Lungs/Pleura: Right chest  tube is in place extending posteriorly and apically. There is collapse and consolidation of the right middle and lower lobes. There is partially loculated gas and fluid within the right pleural space laterally and within the major inter lobar fissure. The pleural fluid component is relatively small and communicates with the chest tube and has significantly decreased in volumes since prior examination, both in its free-flowing component as well as the a loculated component within the inter lobar fissure. The previously noted extravasated collection, however, does appear isolated from the chest tube. Bronchial wall thickening within the aerated right upper lobe is in keeping with airway inflammation. The central airways of the right lower lobe appear impacted and fluid filled. Minimal infiltrate is seen within the basilar lingula and left lower lobe, infectious or inflammatory in nature. No central obstructing mass. No pneumothorax or pleural effusion on the left. Upper Abdomen: Left hydronephrosis is partially visualized, incompletely assessed on this examination. This appears similar to that noted on prior examination of 05/13/2020. Musculoskeletal: No acute bone abnormality. IMPRESSION: Interval placement of right chest tube with significant evacuation loculated right pleural fluid. A small free-flowing fluid in gas component remains as well as a small partially loculated component within the inter lobar fissure.  Interval placement of an and luminal stent graft within the esophagus with incomplete apposition of the graft proximally likely resulting in contrast coursing between the stent graft and the esophageal lumen to the point of leakage noted on prior esophagram. Pilar Plate perforation of the right posterolateral mid to distal esophagus again noted. Associated loculated extravasated contrast and fluid collection within the right pleural space appears isolated from the adjacent right chest tube. Collapse and consolidation of the right middle and right lower lobe with extensive fluid opacification of the right lower lobe airways. Airway inflammation involving the right upper lobe and probable minimal infectious or inflammatory infiltrate within the left lung base. Left hydronephrosis, similar to that noted on prior examination, incompletely evaluated on this examination. Electronically Signed   By: Fidela Salisbury MD   On: 05/24/2020 15:21   CT Angio Chest PE W and/or Wo Contrast  Result Date: 05/11/2020 CLINICAL DATA:  Shortness of breath and weakness for several days EXAM: CT ANGIOGRAPHY CHEST WITH CONTRAST TECHNIQUE: Multidetector CT imaging of the chest was performed using the standard protocol during bolus administration of intravenous contrast. Multiplanar CT image reconstructions and MIPs were obtained to evaluate the vascular anatomy. CONTRAST:  83mL OMNIPAQUE IOHEXOL 350 MG/ML SOLN COMPARISON:  Chest x-ray from earlier in the same day, CTA from 07/12/2018. FINDINGS: Cardiovascular: Thoracic aorta demonstrates atherosclerotic calcifications. No aneurysmal dilatation or dissection is noted. No cardiac enlargement is seen. Coronary calcifications are noted. The pulmonary artery shows a normal branching pattern. No definitive filling defect is identified to suggest pulmonary embolism. Mediastinum/Nodes: Thoracic inlet shows a hypodensity within the left lobe of the thyroid measuring 17 mm in greatest dimension. This is  relatively stable from a prior CT examination from 2019. No sizable hilar or mediastinal adenopathy is noted. The esophagus demonstrates some diffuse wall thickening which may be related to reflux esophagitis. Lungs/Pleura: Bilateral moderate pleural effusions are seen. Associated lower lobe consolidation is noted. No sizable parenchymal nodules are seen. No pneumothorax is noted. Upper Abdomen: Visualized upper abdomen shows geographic decreased attenuation within the left kidney and enlargement of the left kidney. This may simply be related to hydronephrosis. The possibility of an infiltrating mass deserves consideration. This can be further evaluated with CT of the abdomen and pelvis  with contrast. Musculoskeletal: No acute bony abnormality is noted. Review of the MIP images confirms the above findings. IMPRESSION: No evidence of pulmonary emboli. Bilateral pleural effusions and lower lobe consolidation similar to that seen on recent chest x-ray. This is consistent with bibasilar pneumonia and reactive effusions. Diffuse wall thickening throughout the esophagus likely related to reflux esophagitis. Clinical correlation is recommended. It should be noted this is stable from a prior exam of 2019. Enlargement of the left kidney with decreased attenuation identified. Although this may simply represent hydronephrosis possibility of an underlying infiltrative mass deserves consideration. CT of the abdomen and pelvis with contrast is recommended. 17 mm left thyroid nodule. This is roughly stable from the prior CT examination from 2019 and likely benign. Recommend thyroid US (ref: J Am Coll Radiol. 2015 Feb;12(2): 143-50). Electronically Signed   By: Inez Catalina M.D.   On: 05/11/2020 00:00   CT ABDOMEN PELVIS W CONTRAST  Result Date: 05/13/2020 CLINICAL DATA:  LEFT renal mass. Suspicion for renal mass on prior chest CT. EXAM: CT ABDOMEN AND PELVIS WITH CONTRAST TECHNIQUE: Multidetector CT imaging of the abdomen and  pelvis was performed using the standard protocol following bolus administration of intravenous contrast. CONTRAST:  151mL OMNIPAQUE IOHEXOL 300 MG/ML  SOLN COMPARISON:  Chest CT of May 10, 2020 FINDINGS: Lower chest: Loculated bilateral basilar effusions with similar appearance when compared to the study of May 10, 2020. Basilar airspace disease with increasing airspace disease particularly at the LEFT lung base. Hiatal hernia and patulous distal esophagus. Calcified coronary artery disease partially imaged. No pericardial effusion. Hepatobiliary: Liver without focal, suspicious lesion. Portal vein is patent. No pericholecystic stranding. No biliary duct dilation. Pancreas: Pancreas is normal without peripancreatic stranding, ductal dilation or inflammation. Spleen: Spleen normal in size and contour. Adrenals/Urinary Tract: Adrenal glands are normal. Moderate to marked LEFT-sided hydroureteronephrosis with overlying cortical thinning. Large calculus in the proximal LEFT ureter measuring 9 x 8 x 8 mm. No perinephric abscess. Urinary bladder is normal. Normal enhancement of the RIGHT kidney. Enhancement of the kidneys despite scarring and dilation is relatively symmetric and there is an additional small calculus to moderate sized calculus in the lower pole the LEFT kidney. Area with limited assessment due to motion artifact. Stomach/Bowel: Hiatal hernia and some distal esophageal thickening as described. Small duodenal diverticulum in the second portion of the duodenum. No acute gastrointestinal process. Appendix not visualized. Motion artifact in this area limiting assessment but no gross findings of inflammation that would indicate pericecal stranding or secondary signs of acute appendicitis. Colonic interposition along the anterior portion of the liver. No pericolonic stranding. No perirectal stranding. Vascular/Lymphatic: Calcific and noncalcific atheromatous plaque of the abdominal aorta. No aneurysmal  dilation. No adenopathy in the retroperitoneum or upper abdomen. No pelvic lymphadenopathy. Reproductive: Heterogeneous prostate. Calcifications of the vas deferens. Other: No ascites. Musculoskeletal: No acute or destructive bone finding. Spinal degenerative changes. IMPRESSION: 1. Moderate to marked hydronephrosis due to calculus in the proximal to mid LEFT ureter. Urologic consultation and correlation with urine studies to exclude concomitant infection is suggested. 2. Cortical thinning that is seen on the LEFT and preservation of renal enhancement suggests that an element of this process could potentially be chronic. 3. Loculated effusions and worsening of LEFT lower lobe airspace disease that may represent a combination of volume loss and pneumonitis 4. Loculated bilateral basilar effusions with similar appearance when compared to the study of May 10, 2020. Basilar airspace disease with increasing airspace disease particularly at the LEFT  lung base. Findings may represent worsening pneumonia or aspiration. 5. Hiatal hernia and patulous distal esophagus. 6. Aortic atherosclerosis. Aortic Atherosclerosis (ICD10-I70.0). Electronically Signed   By: Zetta Bills M.D.   On: 05/13/2020 12:52   DG Chest Port 1 View  Result Date: 05/29/2020 CLINICAL DATA:  Chest tube.  Shortness of breath. EXAM: PORTABLE CHEST 1 VIEW COMPARISON:  05/28/2020. FINDINGS: Two right chest tubes in stable position. Esophageal stent in stable position. Stable cardiomegaly. Progressive dense right base atelectasis/infiltrate. Persistent right mid lung field infiltrate. Small right pleural effusion, improved from prior exam. No pneumothorax. IMPRESSION: 1. Two right chest tubes in stable position. Esophageal stent stable position. No pneumothorax. 2.  Stable cardiomegaly. 3. Progressive dense right base atelectasis/infiltrate. Persistent right mid lung field infiltrate. 3.  Small right pleural effusion, improved from prior exam.  Electronically Signed   By: Marcello Moores  Register   On: 05/29/2020 07:38   DG Chest Port 1 View  Result Date: 05/28/2020 CLINICAL DATA:  Pleural effusion, chest tube EXAM: PORTABLE CHEST 1 VIEW COMPARISON:  05/27/2020 FINDINGS: There are two right-sided chest tubes. Esophageal stent is again noted. Improved aeration of the right lung with persistent but decreased right pleural effusion. No pneumothorax. Cardiomediastinal contours are partially obscured IMPRESSION: Improved aeration of right lung since 05/27/2020 with persistent but decreased right pleural effusion. Electronically Signed   By: Macy Mis M.D.   On: 05/28/2020 09:53   DG Chest Port 1 View  Result Date: 05/27/2020 CLINICAL DATA:  Shortness of breath EXAM: PORTABLE CHEST 1 VIEW COMPARISON:  05/26/2020 FINDINGS: Cardiac shadow is stable. Esophageal stent is again noted and stable. Two right-sided chest tubes are seen in satisfactory position. The overall appearance is stable. Persistent opacification of the right hemithorax is again noted similar to that seen on recent plain film evaluation. No new focal abnormality is noted. IMPRESSION: Stable appearance of the chest with consolidation in the right hemithorax. Electronically Signed   By: Inez Catalina M.D.   On: 05/27/2020 07:22   DG Chest Port 1 View  Result Date: 05/25/2020 CLINICAL DATA:  Leak EXAM: PORTABLE CHEST 1 VIEW COMPARISON:  May 23, 2020 FINDINGS: The cardiomediastinal silhouette is unchanged in contour.RIGHT-sided chest tubes. Moderate RIGHT pleural effusion. Esophageal stent projects over the mediastinum inferiorly. No pneumothorax. Persistent RIGHT-sided heterogeneous opacities are not significantly changed in comparison to prior. Minimal opacities of the LEFT lung. No acute osseous abnormality. IMPRESSION: Esophageal stent projects over the inferior mediastinum. Electronically Signed   By: Valentino Saxon MD   On: 05/25/2020 11:57   DG CHEST PORT 1 VIEW  Result Date:  05/22/2020 CLINICAL DATA:  59 year old male with perforated esophagus status post surgical exploration, esophageal stent placement and percutaneous gastrostomy tube placement postoperative day 5. Shortness of breath. EXAM: PORTABLE CHEST 1 VIEW COMPARISON:  Portable chest 0535 hours today and earlier. FINDINGS: Portable AP semi upright view at 2330 hours. Stable mediastinal contours. Esophageal stent configuration appears stable since 05/21/2020. But there is increasing confluent right lung base opacity. A single right right chest tube remains in place. No pneumothorax identified. The left lung remains clear allowing for portable technique. Visualized tracheal air column is within normal limits. IMPRESSION: 1. Increasing right lung base opacity since yesterday, indeterminate for pleural fluid versus consolidation. 2. Single right chest tube remains in place, no pneumothorax. 3. Esophageal stent configuration appears stable. Electronically Signed   By: Genevie Ann M.D.   On: 05/22/2020 23:39   DG CHEST PORT 1 VIEW  Result Date: 05/22/2020  CLINICAL DATA:  Follow-up pneumothorax EXAM: PORTABLE CHEST 1 VIEW COMPARISON:  05/21/2020 FINDINGS: Cardiac shadow is stable. Esophageal stent is again noted and stable. Right thoracostomy catheter is seen without evidence of pneumothorax. Right basilar atelectasis and small effusion are seen. The left lung is clear. IMPRESSION: No pneumothorax is noted. Small right pleural effusion and basilar atelectasis. Electronically Signed   By: Inez Catalina M.D.   On: 05/22/2020 08:03   DG Chest Port 1 View  Result Date: 05/21/2020 CLINICAL DATA:  Postop from repair of esophageal rupture. EXAM: PORTABLE CHEST 1 VIEW COMPARISON:  05/20/2020 FINDINGS: An endoluminal stent is seen within the thoracic esophagus. 2 right-sided chest tubes remain in place. Small approximately 10% right pneumothorax is noted, which appears slightly increased in size since previous study. Bibasilar atelectasis is  again seen, right side greater than left, without significant change. Mild cardiomegaly remains stable. IMPRESSION: Small approximately 10% right pneumothorax, slightly increased in size since prior study. Stable bibasilar atelectasis, right side greater than left. Electronically Signed   By: Marlaine Hind M.D.   On: 05/21/2020 08:07   DG Chest Port 1 View  Result Date: 05/20/2020 CLINICAL DATA:  Shortness of breath. RIGHT-sided chest tube present. EXAM: PORTABLE CHEST 1 VIEW COMPARISON:  05/19/2020 FINDINGS: Patient has dual RIGHT-sided chest tubes, unchanged in position. There is no pneumothorax. There is persistent RIGHT pleural effusion and significant opacity at the RIGHT lung base which partially obscures the hemidiaphragm. Subsegmental atelectasis at the LEFT lung base. Stable appearance of esophageal stent. IMPRESSION: 1. Stable appearance of the chest. 2. No pneumothorax. 3. Persistent RIGHT basilar opacity and pleural effusion. Electronically Signed   By: Nolon Nations M.D.   On: 05/20/2020 09:51   DG CHEST PORT 1 VIEW  Result Date: 05/19/2020 CLINICAL DATA:  [place]ema EXAM: PORTABLE CHEST 1 VIEW COMPARISON:  05/18/2020 FINDINGS: Two right apical chest tubes.  No pneumothorax is seen. Patchy right mid/lower lung opacities, reflecting a combination of pneumonia and known complex right pleural fluid collection/empyema. Mild patchy opacity in the left mid/lower lung, likely reflecting pneumonia. Stable esophageal stent. Cardiomegaly. IMPRESSION: Two right apical chest tubes.  No pneumothorax is seen. Multifocal pneumonia, grossly unchanged. Superimposed complex right pleural fluid collection/empyema. Esophageal stent. Electronically Signed   By: Julian Hy M.D.   On: 05/19/2020 07:12   DG CHEST PORT 1 VIEW  Result Date: 05/18/2020 CLINICAL DATA:  Esophageal perforation. EXAM: PORTABLE CHEST 1 VIEW COMPARISON:  05/17/2020. FINDINGS: Esophageal stent stable position. Two right chest tubes in  stable position. Stable cardiomegaly. No pulmonary venous congestion. Low lung volumes with bibasilar atelectasis. Bilateral pulmonary infiltrates/edema again noted. Interim slight increase in right-sided pleural effusion. No pneumothorax noted on today's exam. IMPRESSION: 1. Esophageal stent and 2 right chest tubes noted in stable position. No pneumothorax noted on today's exam. Interim slight increase in right pleural effusion. 2. Low lung volumes with bibasilar atelectasis. Persistent bilateral pulmonary infiltrates/edema without interim change. 3.  Stable cardiomegaly.  No pulmonary venous congestion Electronically Signed   By: Marcello Moores  Register   On: 05/18/2020 05:41   DG Chest Port 1 View  Result Date: 05/17/2020 CLINICAL DATA:  Esophageal stent placement. EXAM: PORTABLE CHEST 1 VIEW COMPARISON:  Chest CT yesterday. FINDINGS: Interval placement of esophageal stent. Two right chest tubes in place. Decreased right hydropneumothorax. Persistent pleural fluid in extrapleural air at the right lung base. Patchy opacity in the right mid lower lung zone. Small left pleural effusion and basilar opacities. Heart size grossly stable. There is  contrast within the stomach. IMPRESSION: 1. Interval placement of esophageal stent. 2. Two right chest tubes in place with decreased right hydropneumothorax. Small volume of residual pleural fluid in extrapleural air at the right lung base. 3. Patchy opacity in the right mid and lower lung zone, may represent atelectasis or re-expansion pulmonary edema. 4. Small left pleural effusion and basilar opacities. Electronically Signed   By: Keith Rake M.D.   On: 05/17/2020 19:57   DG CHEST PORT 1 VIEW  Result Date: 05/16/2020 CLINICAL DATA:  Chest tube insertion for pneumothorax. EXAM: PORTABLE CHEST 1 VIEW COMPARISON:  May 16, 2020 FINDINGS: A right-sided chest tube is noted. Its distal end is kinked and is seen overlying the lateral aspect of the right upper quadrant. A  large, stable right-sided hydropneumothorax is seen with subsequent collapse of the right lung. This is unchanged in appearance when compared to the prior study. Stable mild to moderate severity atelectasis and/or infiltrate is noted within the left lung base. The heart size and mediastinal contours are within normal limits. The visualized skeletal structures are unremarkable. IMPRESSION: 1. Stable large right-sided hydropneumothorax with subsequent collapse of the right lung. 2. Stable mild to moderate severity left basilar atelectasis and/or infiltrate. 3. Right-sided chest tube with its distal end kinked and seen overlying the right upper quadrant. Electronically Signed   By: Virgina Norfolk M.D.   On: 05/16/2020 16:52   DG CHEST PORT 1 VIEW  Result Date: 05/16/2020 CLINICAL DATA:  Pneumothorax post recent thoracentesis, shortness of breath EXAM: PORTABLE CHEST 1 VIEW COMPARISON:  Portable exam 0849 hours compared to 05/14/2020 FINDINGS: Stable heart size and mediastinal contours. RIGHT hydropneumothorax, with increased collapse of RIGHT lung since previous exam. Minimal LEFT pleural effusion with atelectasis versus infiltrate LEFT lower lobe. LEFT upper lung clear. Bones demineralized. IMPRESSION: RIGHT hydropneumothorax with increased collapse of RIGHT lung since previous exam. Minimal LEFT pleural effusion with atelectasis versus infiltrate at LEFT lower lobe. Findings called to Dr. Elsworth Soho on 05/16/2020 at 0922 hours. Electronically Signed   By: Lavonia Dana M.D.   On: 05/16/2020 09:38   DG Chest Port 1 View  Result Date: 05/10/2020 CLINICAL DATA:  Questionable sepsis.  Pneumonia diagnosis yesterday. EXAM: PORTABLE CHEST 1 VIEW COMPARISON:  Radiograph yesterday. FINDINGS: Upper normal heart size. Unchanged mediastinal contours. Hazy symmetric bibasilar opacities are unchanged from radiograph yesterday. No pulmonary edema. No pneumothorax. IMPRESSION: Unchanged symmetric bibasilar opacities are unchanged  from yesterday, likely combination of pleural effusions and atelectasis/pneumonia. Electronically Signed   By: Keith Rake M.D.   On: 05/10/2020 21:24   DG Chest Port 1 View  Result Date: 05/09/2020 CLINICAL DATA:  Cough and fever. EXAM: PORTABLE CHEST 1 VIEW COMPARISON:  09/06/2018 FINDINGS: Heart size is normal. There are bilateral pleural effusions and infiltrate/atelectasis in both lower lungs, consistent with pneumonia. Upper lungs are clear. IMPRESSION: Bilateral lower lobe atelectasis and pneumonia.  Small effusions. Electronically Signed   By: Nelson Chimes M.D.   On: 05/09/2020 14:16   DG Chest Port 1V same Day  Result Date: 05/14/2020 CLINICAL DATA:  Follow-up right-sided pneumothorax. EXAM: PORTABLE CHEST 1 VIEW COMPARISON:  May 14, 2020 (3:02 p.m.) FINDINGS: Stable moderate to marked severity atelectasis and/or infiltrate is noted within the right lung base with stable mild to moderate severity left basilar atelectasis and/or infiltrate seen. There is a small, stable left pleural effusion with a stable moderate sized right pleural effusion. A stable moderate sized right apical pneumothorax is seen. A mild amount of radiopaque  contrast is again seen along the infrahilar region on the right. The heart size and mediastinal contours are within normal limits. The visualized skeletal structures are unremarkable. IMPRESSION: No significant interval change when compared to the prior study dated May 14, 2020 (3:02 p.m.) Electronically Signed   By: Virgina Norfolk M.D.   On: 05/14/2020 19:27   DG Swallowing Func-Speech Pathology  Result Date: 05/14/2020 Objective Swallowing Evaluation: Type of Study: MBS-Modified Barium Swallow Study  Patient Details Name: Junious Ragone MRN: [id number] Date of Birth: 1961/01/29 Today's Date: 05/14/2020 Time: SLP Start Time (ACUTE ONLY): 76 -SLP Stop Time (ACUTE ONLY): 1300 SLP Time Calculation (min) (ACUTE ONLY): 30 min Past Medical History: Past Medical  History: Diagnosis Date . Cocaine abuse ([place])  . ETOH abuse  . GERD (gastroesophageal reflux disease)  . Hyperlipidemia  . 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 BIOPSY  08/25/2018  Procedure: BIOPSY;  Surgeon: [name], MD;  [facility]: [facility];  Service: Endoscopy;; . BIOPSY  12/13/2018  Procedure: BIOPSY;  Surgeon: [name], MD;  Location: [facility];  Service: Endoscopy;;  esophagus . COLONOSCOPY WITH PROPOFOL N/A 04/07/2019  Dr. [name]: 3 Tubular adenomas removed (4 to 8 mm in size), next colonoscopy 3 years. . ESOPHAGOGASTRODUODENOSCOPY (EGD) WITH PROPOFOL N/A 08/25/2018  Dr. [name]: Severely inflamed abnormal appearing mid/distal esophagus.  Encroachment on lumen suspicious for infiltrating neoplasm located be all from severe benign inflammation, extensive gastric erosions and extensive duodenal ulcerations.  Query ischemic process versus other.  Biopsy revealed Barrett's esophagus but negative for malignancy and H. pylori. . ESOPHAGOGASTRODUODENOSCOPY (EGD) WITH PROPOFOL N/A 12/13/2018  Dr. [name]: peptic stricture s/p balloon dilatation. abnormal esophageal mucosa, by c/w barrett's without dysplasia, previous PUD completely healed.  Marland Kitchen POLYPECTOMY  04/07/2019  Procedure: POLYPECTOMY;  Surgeon: [name], MD;  Location: [facility];  Service: Endoscopy;;  colon HPI: 59 y.o. male with medical history significant for prior stroke, hyperlipidemia, GERD, BPH, barrett's esophagus    and remote history of cocaine abuse who presents to the emergency department due to 5 days of weakness, complains of productive cough with clear phlegm with occasional black speckles with associated reproducible midsternal chest pain. Patient was seen in the ED 2 days ago (7/28) due to similar symptoms, during which he reported a max temperature of 100F.  Chest x-ray done at that time showed pneumonia and patient was treated  with IV fluid.  Amoxicillin and doxycycline were prescribed on the was advised to follow-up with his primary care physician.  Patient returned to the ED yesterday due to increased heart rate and sob.  He states that he was taking his blood pressure medication as prescribed and denies cocaine use. Bilateral pleural effusions and lower lobe consolidation similar to that seen on recent chest x-ray. This is consistent with bibasilar pneumonia and reactive effusions. Diffuse wall thickening throughout the esophagus likely related to reflux esophagitis. Clinical correlation is recommended. It should be noted this is stable from a prior exam of 2019.  Subjective: "My chest hurts when I am coughing sometimes." Assessment / Plan / Recommendation CHL IP CLINICAL IMPRESSIONS 05/14/2020 Clinical Impression Pt presents with mild oropharyngeal phase dysphagia characterized by lingual pumping with reduced bolus cohesiveness and premature spillage of liquids to the valleculae. Hyolaryngeal excursion appeared WNL. Pt without penetration/aspiration across consistencies/textures and only with trace amounts of thin liquid residuals along base of tongue and in lateral channels which  cleared with secondary swallow. Pt is currently on 4L O2 via nasal cannula. Risk for aspiration with po intake appears minimal, however his respiratory status slightly increases his risk. Pt also with history of esophageal dysphagia. Recommend regular textures and thin liquids with standard aspiration and reflux precautions. No further SLP services indicated at this time. This study was reviewed with Dr. Elsworth Soho (pulmonary).  SLP Visit Diagnosis Dysphagia, oropharyngeal phase (R13.12) Attention and concentration deficit following -- Frontal lobe and executive function deficit following -- Impact on safety and function Mild aspiration risk   CH[facility] IP TREATMENT RECOMMENDATION 05/14/2020 Treatment Recommendations No treatment recommended at this time   Prognosis  05/14/2020 Prognosis for Safe Diet Advancement Good Barriers to Reach Goals -- Barriers/Prognosis Comment -- [facility] IP DIET RECOMMENDATION 05/14/2020 SLP Diet Recommendations Regular solids;Thin liquid Liquid Administration via Cup;Straw Medication Administration Whole meds with liquid Compensations Slow rate;Small sips/bites Postural Changes Seated upright at 90 degrees;Remain semi-upright after after feeds/meals (Comment)   CHL IP OTHER RECOMMENDATIONS 05/14/2020 Recommended Consults -- Oral Care Recommendations Oral care BID Other Recommendations Clarify dietary restrictions   CH[facility] IP FOLLOW UP RECOMMENDATIONS 05/14/2020 Follow up Recommendations None   CHL IP FREQUENCY AND DURATION 05/14/2020 Speech Therapy Frequency (ACUTE ONLY) min 2x/week Treatment Duration 1 week      CHL IP ORAL PHASE 05/14/2020 Oral Phase Impaired Oral - Pudding Teaspoon -- Oral - Pudding Cup -- Oral - Honey Teaspoon -- Oral - Honey Cup -- Oral - Nectar Teaspoon -- Oral - Nectar Cup -- Oral - Nectar Straw -- Oral - Thin Teaspoon Premature spillage Oral - Thin Cup Premature spillage;Decreased bolus cohesion Oral - Thin Straw Premature spillage Oral - Puree Lingual pumping Oral - Mech Soft -- Oral - Regular Delayed oral transit Oral - Multi-Consistency -- Oral - Pill -- Oral Phase - Comment --  CHL IP PHARYNGEAL PHASE 05/14/2020 Pharyngeal Phase Impaired Pharyngeal- Pudding Teaspoon -- Pharyngeal -- Pharyngeal- Pudding Cup -- Pharyngeal -- Pharyngeal- Honey Teaspoon -- Pharyngeal -- Pharyngeal- Honey Cup -- Pharyngeal -- Pharyngeal- Nectar Teaspoon -- Pharyngeal -- Pharyngeal- Nectar Cup -- Pharyngeal -- Pharyngeal- Nectar Straw -- Pharyngeal -- Pharyngeal- Thin Teaspoon Delayed swallow initiation-vallecula Pharyngeal -- Pharyngeal- Thin Cup Delayed swallow initiation-vallecula;Lateral channel residue Pharyngeal -- Pharyngeal- Thin Straw Lateral channel residue;Delayed swallow initiation-vallecula Pharyngeal -- Pharyngeal- Puree Delayed swallow  initiation-vallecula;WFL Pharyngeal -- Pharyngeal- Mechanical Soft -- Pharyngeal -- Pharyngeal- Regular WFL Pharyngeal -- Pharyngeal- Multi-consistency -- Pharyngeal -- Pharyngeal- Pill WFL Pharyngeal -- Pharyngeal Comment --  CHL IP CERVICAL ESOPHAGEAL PHASE 05/14/2020 Cervical Esophageal Phase WFL Pudding Teaspoon -- Pudding Cup -- Honey Teaspoon -- Honey Cup -- Nectar Teaspoon -- Nectar Cup -- Nectar Straw -- Thin Teaspoon -- Thin Cup -- Thin Straw -- Puree -- Mechanical Soft -- Regular -- Multi-consistency -- Pill -- Cervical Esophageal Comment -- Thank you, Genene Churn, [place] PORTER,DABNEY 05/14/2020, 2:21 PM              DG C-Arm 1-60 Min  Result Date: 05/25/2020 CLINICAL DATA:  Esophageal stent placement. EXAM: DG C-ARM 1-60 MIN; CHEST  1 VIEW FLUOROSCOPY TIME:  Fluoro time: 4 Minutes 17 secondsRadiation exposure index: MGy: 41.69Number of acquired spot images: Five spot fluoro graphic images. COMPARISON:  May 17, 2020.  May 23, 2020 FINDINGS: Spot fluoroscopy images were obtained for surgical planning purposes. Esophageal stent is seen traversing the thorax. Enteric contrast traverses the stent and enters the proximal stomach. None of the provided radiographs demonstrate evaluation of the site of previous leak into the  RIGHT pleural space. Selective LEFT mainstem intubation. IMPRESSION: Spot fluoroscopy images from surgical planning purposes. Please reference procedure report for further details. Electronically Signed   By: Valentino Saxon MD   On: 05/25/2020 10:59   DG C-Arm 1-60 Min  Result Date: 05/17/2020 CLINICAL DATA:  Surgery. Esophageal stent placement. EXAM: DG C-ARM 1-60 MIN; CHEST  1 VIEW FLUOROSCOPY TIME:  Fluoroscopy Time:  5 minutes 48 seconds Radiation Exposure Index (if provided by the fluoroscopic device): 59.06 mGy Number of Acquired Spot Images: 3 COMPARISON:  Preprocedural CT yesterday. FINDINGS: Three fluoroscopic spot views obtained in the operating room.  Endoscope is in place. A stent overlies the endoscope with tip projecting over the esophageal diaphragmatic hiatus. Subsequent enteric tube courses in the stent. There is contrast opacifying the distal stent on image 1. Right chest tube is in place. IMPRESSION: Intraoperative fluoroscopy during esophageal stent placement. Please reference procedural report for details. Electronically Signed   By: Keith Rake M.D.   On: 05/17/2020 19:55   ECHOCARDIOGRAM COMPLETE  Result Date: 05/11/2020    ECHOCARDIOGRAM REPORT   Patient Name:   Evan Chavez Date of Exam: 05/11/2020 Medical Rec #:  [id number]           Height:       69.0 in Accession #:    [id number]          Weight:       153.0 lb Date of Birth:  August 26, 1961           BSA:          1.844 m Patient Age:    25 years            BP:           141/90 mmHg Patient Gender: M                   HR:           100 bpm. Exam Location:  [facility] Procedure: 2D Echo, Cardiac Doppler and Color Doppler Indications:    CHF  History:        Patient has prior history of Echocardiogram examinations, most                 recent 07/13/2018. CHF, Stroke, Signs/Symptoms:Shortness of                 Breath; Risk Factors:Hypertension and Dyslipidemia. Cocaine                 abuse, Pneumonia.  Sonographer:    Dustin Flock RDCS Referring Phys: [id number] [place]  1. Left ventricular ejection fraction, by estimation, is 65 to 70%. The left ventricle has normal function. The left ventricle has no regional wall motion abnormalities. Left ventricular diastolic parameters are consistent with Grade I diastolic dysfunction (impaired relaxation).  2. Right ventricular systolic function is normal. The right ventricular size is normal. Tricuspid regurgitation signal is inadequate for assessing PA pressure.  3. The mitral valve is grossly normal. Trivial mitral valve regurgitation.  4. The aortic valve is tricuspid. Aortic valve regurgitation is not visualized.  5. The  inferior vena cava is normal in size with greater than 50% respiratory variability, suggesting right atrial pressure of 3 mmHg. FINDINGS  Left Ventricle: Left ventricular ejection fraction, by estimation, is 65 to 70%. The left ventricle has normal function. The left ventricle has no regional wall motion abnormalities. The left ventricular internal cavity size was normal in size.  There is  no left ventricular hypertrophy. Left ventricular diastolic parameters are consistent with Grade I diastolic dysfunction (impaired relaxation). Right Ventricle: The right ventricular size is normal. No increase in right ventricular wall thickness. Right ventricular systolic function is normal. Tricuspid regurgitation signal is inadequate for assessing PA pressure. Left Atrium: Left atrial size was normal in size. Right Atrium: Right atrial size was normal in size. Pericardium: There is no evidence of pericardial effusion. Mitral Valve: The mitral valve is grossly normal. Trivial mitral valve regurgitation. Tricuspid Valve: The tricuspid valve is grossly normal. Tricuspid valve regurgitation is mild. Aortic Valve: The aortic valve is tricuspid. Aortic valve regurgitation is not visualized. Pulmonic Valve: The pulmonic valve was grossly normal. Pulmonic valve regurgitation is trivial. Aorta: The aortic root is normal in size and structure. Venous: The inferior vena cava is normal in size with greater than 50% respiratory variability, suggesting right atrial pressure of 3 mmHg. IAS/Shunts: No atrial level shunt detected by color flow Doppler.  LEFT VENTRICLE PLAX 2D LVIDd:         4.30 cm  Diastology LVIDs:         2.68 cm  LV e' lateral:   6.42 cm/s LV PW:         0.82 cm  LV E/e' lateral: 11.1 LV IVS:        0.98 cm  LV e' medial:    5.11 cm/s LVOT diam:     2.20 cm  LV E/e' medial:  13.9 LV SV:         81 LV SV Index:   44 LVOT Area:     3.80 cm  RIGHT VENTRICLE RV Basal diam:  2.99 cm RV S prime:     21.60 cm/s TAPSE (M-mode):  3.1 cm LEFT ATRIUM             Index       RIGHT ATRIUM           Index LA diam:        3.50 cm 1.90 cm/m  RA Area:     14.20 cm LA Vol (A2C):   42.8 ml 23.21 ml/m RA Volume:   31.20 ml  16.92 ml/m LA Vol (A4C):   46.7 ml 25.33 ml/m LA Biplane Vol: 45.6 ml 24.73 ml/m  AORTIC VALVE LVOT Vmax:   124.00 cm/s LVOT Vmean:  72.900 cm/s LVOT VTI:    0.212 m  AORTA Ao Root diam: 3.00 cm MITRAL VALVE MV Area (PHT): 6.65 cm     SHUNTS MV Decel Time: 114 msec     Systemic VTI:  0.21 m MV E velocity: 71.10 cm/s   Systemic Diam: 2.20 cm MV A velocity: 113.00 cm/s MV E/A ratio:  0.63 Rozann Lesches MD Electronically signed by Rozann Lesches MD Signature Date/Time: 05/11/2020/4:35:39 PM    Final    DG ESOPHAGUS W SINGLE CM (SOL OR THIN BA)  Result Date: 05/28/2020 CLINICAL DATA:  Esophageal stent with prior leak, reassessment for leak EXAM: ESOPHOGRAM/BARIUM SWALLOW TECHNIQUE: Single contrast examination was performed using 25 cc Omnipaque 300. FLUOROSCOPY TIME:  Fluoroscopy Time:  3 minutes, 24 seconds Radiation Exposure Index (if provided by the fluoroscopic device): 33 mGy Number of Acquired Spot Images: 0 COMPARISON:  Multiple exams, including 05/23/2020 FINDINGS: Mid to distal expandable esophageal stent noted. The prior right-sided leak from the distal margin of the stent is no longer present. Contrast medium extending around the distal margin of the stent primarily on the left side appears  to be intraluminal and likely in between the stent and the wall of the distal esophagus or small hiatal hernia based on the contained appearance for example on image 26/8. This collection was noted to extend proximally along the distal half of the stent along the left side. This is not unexpected given the appearance on the chest CT of 05/24/2020, which showed some luminal gas tracking along the left side of the stent in this vicinity. Accordingly, no leak from the esophagus is currently identified. IMPRESSION: 1. No  esophageal leak is currently identified. 2. A small amount of intraluminal contrast extends around the stent distally, but this is felt to be due to incomplete apposition of the stent with the lumen, rather than a leak. Electronically Signed   By: Van Clines M.D.   On: 05/28/2020 08:29   DG ESOPHAGUS W SINGLE CM (SOL OR THIN BA)  Result Date: 05/23/2020 CLINICAL DATA:  Postop esophageal perforation 05/17/2020. EXAM: ESOPHOGRAM/BARIUM SWALLOW TECHNIQUE: Single contrast examination was performed using  water-soluble. FLUOROSCOPY TIME:  Fluoroscopy Time:  1 minutes 36 seconds Radiation Exposure Index (if provided by the fluoroscopic device): Number of Acquired Spot Images: 5 COMPARISON:  CT chest 05/16/2020. FINDINGS: In the recumbent LPO position, patient drank water soluble Omnipaque 300. There is a long esophageal wall stent centered in the mid esophagus. Leakage of contrast from the right posterolateral wall of the distal esophagus, in the region of the distal aspect of the wall stent, is seen with filling of the right pleural space. IMPRESSION: Persistent leak from the right posterolateral aspect of the esophagus, along the distal aspect of the stent, with filling of the adjacent right pleural space. Electronically Signed   By: Lorin Picket M.D.   On: 05/23/2020 10:05   US THORACENTESIS ASP PLEURAL SPACE W/IMG GUIDE  Result Date: 05/14/2020 INDICATION: Complicated RIGHT pleural effusion EXAM: ULTRASOUND GUIDED DIAGNOSTIC RIGHT THORACENTESIS MEDICATIONS: None. COMPLICATIONS: Probable RIGHT pneumothorax, see chest radiograph report PROCEDURE: An ultrasound guided thoracentesis was thoroughly discussed with the patient and questions answered. The benefits, risks, alternatives and complications were also discussed. The patient understands and wishes to proceed with the procedure. Written consent was obtained. Ultrasound was performed to localize and mark an adequate pocket of fluid in the posterior  RIGHT chest. The area was then prepped and draped in the normal sterile fashion. 1% Lidocaine was used for local anesthesia. Under ultrasound guidance a 5 [place] Yueh catheter was introduced. Thoracentesis was performed. The catheter was removed and a dressing applied. FINDINGS: A total of approximately 30 mL of cloudy yellow slightly thicker RIGHT pleural fluid was removed. Samples were sent to the laboratory as requested by the clinical team. IMPRESSION: Successful ultrasound guided diagnostic RIGHT thoracentesis yielding 30 mL of cloudy yellow RIGHT pleural fluid, cannot exclude infection/empyema with this appearance. Findings discussed with Dr. Elsworth Soho on 05/14/2020 at 1545 hours. Electronically Signed   By: Lavonia Dana M.D.   On: 05/14/2020 15:50     Time Spent in minutes  30     Desiree Hane M.D on 06/01/2020 at 1:28 PM  To page go to [email or web address] - password [facility]

## 2020-06-01 NOTE — Progress Notes (Signed)
Inpatient Rehabilitation Admissions Coordinator  Inpatient rehab consult received. I met with patient at bedside for assessment. I have been involved prior to 8/13 surgery. Patient continues with oral secretion issues. I await further progress to assist in determining if he would be a candidate for more intensive therapy at [place] prior to return home with family. Would like to see his progression further before making final determination for we previously recommended SNF. I will follow up Monday.  Danne Baxter, RN, MSN Rehab Admissions Coordinator [phone] 06/01/2020 2:00 PM

## 2020-06-01 NOTE — Progress Notes (Signed)
Physical Therapy Treatment Patient Details Name: Evan Chavez MRN: [id number] DOB: August 24, 1961 Today's Date: 06/01/2020    History of Present Illness Pt is 59 yo male presents to the hospital on 7/28 with generalized weakness, productive cough and low-grade fever. Pt is currently being treated for hydropneumothorax, Sepsis aspiration pneumonia and esophagal rupture, AKI and acute delirium. Pt had successful R thoracentesis on 8/2. Pt had a chest tube put in on 8/2 and was removed 8/9. Pt also underwent a VATS, decortication, EGD, PEG tube placement, and a esophageal stent on 8/9. PMH of GERD, Barrett's esophagus, peptic ulcer disease with extensive gastric erosion, duodenal ulcer and esophageal stricture, also with history of prior stroke, hyperlipidemia, BPH, remote to cocaine abuse. Pt returned to OR on 8/13 for Right thoracoscopy, chest tube placement, esophageal stent placement.  Chest tube removed 8/20 and chest xray stable.    PT Comments    Pt making gradual progress.  He was able to ambulate 25' with RW and min A - distance was limited due to elevated HR.  Pt required multimodal cues for posture, RW use, and safe transfer techniques.  Will continue to benefit from acute PT services.     Follow Up Recommendations  CIR     Equipment Recommendations  Wheelchair (measurements PT);Wheelchair cushion (measurements PT);Hospital bed;3in1 (PT);Rolling walker with 5" wheels    Recommendations for Other Services       Precautions / Restrictions Precautions Precautions: Fall Precaution Comments: PEG    Mobility  Bed Mobility               General bed mobility comments: in chair at arrival  Transfers Overall transfer level: Needs assistance Equipment used: Rolling walker (2 wheeled) Transfers: Sit to/from Stand Sit to Stand: Min assist         General transfer comment: sit to stand x 4; cues for safe hand placement; min A to power  up  Ambulation/Gait Ambulation/Gait assistance: Min assist Gait Distance (Feet): 25 Feet Assistive device: Rolling walker (2 wheeled) Gait Pattern/deviations: Step-to pattern;Shuffle;Trunk flexed Gait velocity: decreased   General Gait Details: cued for RW, posture, and to back all of the way prior to sitting - limited due to HR   Stairs             Wheelchair Mobility    Modified Rankin (Stroke Patients Only)       Balance Overall balance assessment: Needs assistance Sitting-balance support: Feet supported;No upper extremity supported Sitting balance-Leahy Scale: Fair Sitting balance - Comments: Could maintain static balance but with forward trunk   Standing balance support: Bilateral upper extremity supported Standing balance-Leahy Scale: Poor Standing balance comment: REquired use of RW and min gurad-min A to steady                            Cognition Arousal/Alertness: Awake/alert Behavior During Therapy: Flat affect Overall Cognitive Status: Impaired/Different from baseline Area of Impairment: Problem solving;Awareness;Following commands;Attention                   Current Attention Level: Sustained   Following Commands: Follows one step commands consistently;Follows multi-step commands inconsistently   Awareness: Emergent Problem Solving: Slow processing;Decreased initiation;Difficulty sequencing;Requires verbal cues;Requires tactile cues General Comments: Pt with expressive difficulties at baseline; followed commands with cues      Exercises      General Comments General comments (skin integrity, edema, etc.): On RA with O2 sats >94% throughout treatment.  HR 120 bpm rest and up to 151 max with ambulation.  Pt returned to sitting after short distance ambulation due to elevated HR.  Required 4-5 mins to return to 120 bpm.  Bp was stable in all positions.      Pertinent Vitals/Pain Pain Assessment: No/denies pain    Home Living                       Prior Function            PT Goals (current goals can now be found in the care plan section) Acute Rehab PT Goals Patient Stated Goal: none stated PT Goal Formulation: Patient unable to participate in goal setting Time For Goal Achievement: 06/04/20 Potential to Achieve Goals: Good Progress towards PT goals: Progressing toward goals    Frequency    Min 3X/week      PT Plan Current plan remains appropriate    Co-evaluation              AM-PAC PT "6 Clicks" Mobility   Outcome Measure  Help needed turning from your back to your side while in a flat bed without using bedrails?: A Little Help needed moving from lying on your back to sitting on the side of a flat bed without using bedrails?: A Little Help needed moving to and from a bed to a chair (including a wheelchair)?: A Little Help needed standing up from a chair using your arms (e.g., wheelchair or bedside chair)?: A Little Help needed to walk in hospital room?: A Little Help needed climbing 3-5 steps with a railing? : A Lot 6 Click Score: 17    End of Session Equipment Utilized During Treatment: Gait belt Activity Tolerance: Patient tolerated treatment well;Other (comment) (some limitations due to tachycardia) Patient left: with call bell/phone within reach;with chair alarm set;in chair Nurse Communication: Mobility status;Other (comment) (RN present and aware of HR) PT Visit Diagnosis: Unsteadiness on feet (R26.81);Muscle weakness (generalized) (M62.81)     Time: 2641-5830 PT Time Calculation (min) (ACUTE ONLY): 23 min  Charges:  $Gait Training: 8-22 mins $Therapeutic Activity: 8-22 mins                     Abran Richard, PT Acute Rehab Services Pager [phone] [facility] [place] 06/01/2020, 5:21 PM

## 2020-06-01 NOTE — Plan of Care (Signed)
  Problem: Education: Goal: Knowledge of General Education information will improve Description: Including pain rating scale, medication(s)/side effects and non-pharmacologic comfort measures Outcome: Progressing   Problem: Health Behavior/Discharge Planning: Goal: Ability to manage health-related needs will improve Outcome: Progressing   Problem: Clinical Measurements: Goal: Ability to maintain clinical measurements within normal limits will improve Outcome: Progressing   Problem: Clinical Measurements: Goal: Will remain free from infection Outcome: Progressing   Problem: Clinical Measurements: Goal: Diagnostic test results will improve Outcome: Progressing   Problem: Clinical Measurements: Goal: Respiratory complications will improve Outcome: Progressing   Problem: Clinical Measurements: Goal: Cardiovascular complication will be avoided Outcome: Progressing   Problem: Activity: Goal: Risk for activity intolerance will decrease Outcome: Progressing   Problem: Nutrition: Goal: Adequate nutrition will be maintained Outcome: Progressing   Problem: Coping: Goal: Level of anxiety will decrease Outcome: Progressing   Problem: Elimination: Goal: Will not experience complications related to bowel motility Outcome: Progressing   Problem: Pain Managment: Goal: General experience of comfort will improve Outcome: Progressing   Problem: Safety: Goal: Ability to remain free from injury will improve Outcome: Progressing   Problem: Skin Integrity: Goal: Risk for impaired skin integrity will decrease Outcome: Progressing

## 2020-06-02 ENCOUNTER — Inpatient Hospital Stay (HOSPITAL_COMMUNITY): Payer: Medicaid Other

## 2020-06-02 ENCOUNTER — Encounter (HOSPITAL_COMMUNITY): Payer: Self-pay | Admitting: Thoracic Surgery (Cardiothoracic Vascular Surgery)

## 2020-06-02 DIAGNOSIS — R0682 Tachypnea, not elsewhere classified: Secondary | ICD-10-CM

## 2020-06-02 DIAGNOSIS — R918 Other nonspecific abnormal finding of lung field: Secondary | ICD-10-CM

## 2020-06-02 LAB — CBC WITH DIFFERENTIAL/PLATELET
Abs Immature Granulocytes: 0.15 10*3/uL — ABNORMAL HIGH (ref 0.00–0.07)
Basophils Absolute: 0.1 10*3/uL (ref 0.0–0.1)
Basophils Relative: 0 %
Eosinophils Absolute: 0.1 10*3/uL (ref 0.0–0.5)
Eosinophils Relative: 0 %
HCT: 30.3 % — ABNORMAL LOW (ref 39.0–52.0)
Hemoglobin: 9.4 g/dL — ABNORMAL LOW (ref 13.0–17.0)
Immature Granulocytes: 1 %
Lymphocytes Relative: 5 %
Lymphs Abs: 1.1 10*3/uL (ref 0.7–4.0)
MCH: 26.9 pg (ref 26.0–34.0)
MCHC: 31 g/dL (ref 30.0–36.0)
MCV: 86.6 fL (ref 80.0–100.0)
Monocytes Absolute: 1.2 10*3/uL — ABNORMAL HIGH (ref 0.1–1.0)
Monocytes Relative: 6 %
Neutro Abs: 18.7 10*3/uL — ABNORMAL HIGH (ref 1.7–7.7)
Neutrophils Relative %: 88 %
Platelets: 688 10*3/uL — ABNORMAL HIGH (ref 150–400)
RBC: 3.5 MIL/uL — ABNORMAL LOW (ref 4.22–5.81)
RDW: 15.2 % (ref 11.5–15.5)
WBC: 21.2 10*3/uL — ABNORMAL HIGH (ref 4.0–10.5)
nRBC: 0 % (ref 0.0–0.2)

## 2020-06-02 LAB — COMPREHENSIVE METABOLIC PANEL
ALT: 130 U/L — ABNORMAL HIGH (ref 0–44)
AST: 37 U/L (ref 15–41)
Albumin: 1.9 g/dL — ABNORMAL LOW (ref 3.5–5.0)
Alkaline Phosphatase: 262 U/L — ABNORMAL HIGH (ref 38–126)
Anion gap: 7 (ref 5–15)
BUN: 18 mg/dL (ref 6–20)
CO2: 24 mmol/L (ref 22–32)
Calcium: 8.5 mg/dL — ABNORMAL LOW (ref 8.9–10.3)
Chloride: 103 mmol/L (ref 98–111)
Creatinine, Ser: 0.9 mg/dL (ref 0.61–1.24)
GFR calc Af Amer: >60 mL/min (ref >60)
GFR calc non Af Amer: >60 mL/min (ref >60)
Glucose, Bld: 151 mg/dL — ABNORMAL HIGH (ref 70–99)
Potassium: 4.5 mmol/L (ref 3.5–5.1)
Sodium: 134 mmol/L — ABNORMAL LOW (ref 135–145)
Total Bilirubin: 0.4 mg/dL (ref 0.3–1.2)
Total Protein: 7.4 g/dL (ref 6.5–8.1)

## 2020-06-02 LAB — GLUCOSE, CAPILLARY
Glucose-Capillary: 158 mg/dL — ABNORMAL HIGH (ref 70–99)
Glucose-Capillary: 170 mg/dL — ABNORMAL HIGH (ref 70–99)
Glucose-Capillary: 133 mg/dL — ABNORMAL HIGH (ref 70–99)
Glucose-Capillary: 136 mg/dL — ABNORMAL HIGH (ref 70–99)

## 2020-06-02 LAB — LACTIC ACID, PLASMA
Lactic Acid, Venous: 1.5 mmol/L (ref 0.5–1.9)
Lactic Acid, Venous: 1.6 mmol/L (ref 0.5–1.9)

## 2020-06-02 LAB — PROTIME-INR
INR: 1.2 (ref 0.8–1.2)
INR: 1.2 (ref 0.8–1.2)
Prothrombin Time: 14.3 seconds (ref 11.4–15.2)
Prothrombin Time: 14.5 seconds (ref 11.4–15.2)

## 2020-06-02 LAB — APTT: aPTT: 35 seconds (ref 24–36)

## 2020-06-02 MED ORDER — TRAMADOL HCL 50 MG PO TABS
25.0000 mg | ORAL_TABLET | Freq: Four times a day (QID) | ORAL | Status: DC | PRN
  Administered 2020-06-02 – 2020-06-05 (×2): 25 mg
  Filled 2020-06-02 (×2): qty 1

## 2020-06-02 MED ORDER — SODIUM CHLORIDE 0.9 % IV SOLN
2.0000 g | Freq: Three times a day (TID) | INTRAVENOUS | Status: AC
  Administered 2020-06-02 – 2020-06-08 (×18): 2 g via INTRAVENOUS
  Filled 2020-06-02 (×20): qty 2

## 2020-06-02 MED ORDER — VANCOMYCIN HCL 1250 MG/250ML IV SOLN
1250.0000 mg | Freq: Once | INTRAVENOUS | Status: AC
  Administered 2020-06-02: 1250 mg via INTRAVENOUS
  Filled 2020-06-02: qty 250

## 2020-06-02 MED ORDER — PIPERACILLIN-TAZOBACTAM 3.375 G IVPB: 3.3750 g | Freq: Three times a day (TID) | INTRAVENOUS | Status: DC

## 2020-06-02 MED ORDER — IOHEXOL 300 MG/ML  SOLN
75.0000 mL | Freq: Once | INTRAMUSCULAR | Status: AC | PRN
  Administered 2020-06-02: 75 mL via INTRAVENOUS

## 2020-06-02 MED ORDER — VANCOMYCIN HCL IN DEXTROSE 1-5 GM/200ML-% IV SOLN: 1000.0000 mg | Freq: Once | INTRAVENOUS | Status: DC

## 2020-06-02 MED ORDER — SODIUM CHLORIDE 0.9 % IV SOLN
2.0000 g | Freq: Once | INTRAVENOUS | Status: AC
  Administered 2020-06-02: 2 g via INTRAVENOUS
  Filled 2020-06-02: qty 2

## 2020-06-02 MED ORDER — VANCOMYCIN HCL IN DEXTROSE 1-5 GM/200ML-% IV SOLN
1000.0000 mg | Freq: Two times a day (BID) | INTRAVENOUS | Status: DC
  Administered 2020-06-03 – 2020-06-05 (×5): 1000 mg via INTRAVENOUS
  Filled 2020-06-02 (×6): qty 200

## 2020-06-02 MED ORDER — PIPERACILLIN-TAZOBACTAM 3.375 G IVPB 30 MIN
3.3750 g | Freq: Once | INTRAVENOUS | Status: DC
  Filled 2020-06-02: qty 50

## 2020-06-02 NOTE — Progress Notes (Addendum)
[address]       [place] [address]             [phone]      8 Days Post-Op Procedure(s) (LRB): ESOPHAGOGASTRODUODENOSCOPY (EGD); ESOPHAGEAL STENT REMOVAL. (N/A) ESOPHAGEAL STENT PLACEMENT USING A 23 MM COVERED STENT (N/A) VIDEO ASSISTED THORACOSCOPY (VATS)/EMPYEMA (Right)   Subjective:  Doing okay.  Denies pain/shortness of breath, coughing up some white sputum.  Objective: Vital signs in last 24 hours: Temp:  [99 F (37.2 C)-100.9 F (38.3 C)] 100.2 F (37.9 C) (08/21 0900) Pulse Rate:  [112-139] 114 (08/21 1000) Cardiac Rhythm: Sinus tachycardia (08/21 0900) Resp:  [26-43] 35 (08/21 1000) BP: (129-152)/(81-93) 143/90 (08/21 1000) SpO2:  [93 %-100 %] 100 % (08/21 1000) Weight:  [58.5 kg] 58.5 kg (08/21 0409)  Intake/Output from previous day: 08/20 0701 - 08/21 0700 In: 1592.9 [P.O.:50; NG/GT:1542.9] Out: 480 [Urine:475; Drains:5] Intake/Output this shift: Total I/O In: 346.4 [Other:120; NG/GT:226.4] Out: 401 [Urine:400; Drains:1]  General appearance: alert, cooperative and no distress Heart: regular rate and rhythm Lungs: clear to auscultation bilaterally Abdomen: soft, non-tender; bowel sounds normal; no masses,  no organomegaly Extremities: extremities normal, atraumatic, no cyanosis or edema Wound: clean and dry  Lab Results: Recent Labs    06/01/20 0019 06/02/20 0038  WBC 15.3* 21.2*  HGB 9.4* 9.4*  HCT 30.8* 30.3*  PLT 739* 688*   BMET:  Recent Labs    06/01/20 0019 06/02/20 0038  NA 131* 134*  K 4.4 4.5  CL 100 103  CO2 22 24  GLUCOSE 158* 151*  BUN 14 18  CREATININE 0.79 0.90  CALCIUM 8.6* 8.5*    PT/INR: No results for input(s): LABPROT, INR in the last 72 hours. ABG    Component Value Date/Time   PHART 7.443 05/26/2020 0335   HCO3 23.5 05/26/2020 0335   TCO2 27 05/17/2020 2036   ACIDBASEDEF 0.1 05/26/2020 0335   O2SAT 96.7 05/26/2020 0335   CBG (last 3)  Recent Labs    06/01/20 2319  06/02/20 0353 06/02/20 0804  GLUCAP 158* 158* 170*    Assessment/Plan: S/P Procedure(s) (LRB): ESOPHAGOGASTRODUODENOSCOPY (EGD); ESOPHAGEAL STENT REMOVAL. (N/A) ESOPHAGEAL STENT PLACEMENT USING A 23 MM COVERED STENT (N/A) VIDEO ASSISTED THORACOSCOPY (VATS)/EMPYEMA (Right)  1. CV- hemodynamically stable 2. Pulm- CT removed yesterday, JP drain remains in place.. CXR with unchanged right lower lobe opacification.Marland Kitchen output was 5 cc 3. ID- patient febrile, Leukocytosis up to 21.2K... blood cultures obtained yesterday, remains on anti-fungal coverage, but may need to restart broad spectrum ABX... appreciate medicines assistance with this 4. GI- clear diet as tolerated, will be advancing diet very slowly 5. Deconditioning- once medically stable will need placement of CIR vs. SNF 6. dispo- patient stable, continue current care, await blood culture results, again appreciate medicine assistance, may benefit resumption of broad spectrum ABX coverage, on antifungal   LOS: 22 days    Evan Handler, PA-C  06/02/2020   Chart reviewed, patient examined, agree with above. Fever to 102 two days ago, 100.9 yesterday and 100.2 this am. WBC had been decreasing but has gone up progressively over the last few days to 21.2 today. CXR today shows persistent right pleural effusion that is small and right base opacity. Chest tube in good position along mediastinum. Chest CT done today not read yet but I reviewed. There is a small loculated fluid collection around the chest tube. There is also consolidation of the RLL suggesting some pneumonia. He has been started  on vanc and Maxipime. I would follow his course on antibiotics for now.

## 2020-06-02 NOTE — Progress Notes (Signed)
[facility]  PROGRESS NOTE  Bertel Venard Blase FIE:[id number] DOB: 1961/07/28 DOA: 05/10/2020 PCP: Rosita Fire, MD Admit date - 05/10/2020   Admitting Physician Lajuana Matte, MD  Outpatient Primary MD for the patient is Rosita Fire, MD  LOS - 22 Brief Narrative   Katai Marsico is a 59 y.o. year old male with medical history significant for GERD, Barrett's esophagus, peptic ulcer disease with extensive gastric erosion, duodenal ulcerand esophageal stricture status post balloon dilatation on 12/2018, also with history of prior stroke, hyperlipidemia, BPH, remote cocaine abuse. who presented on 05/10/2020 to [facility] with generalized weakness, productive cough and low-grade fever shortness of breath initially admitted for pneumonia based off CTA chest showing bibasilar consolidation pleural effusions treated with ceftriaxone and azithromycin. Hospital course complicated by right-sided hydropneumothorax with possible lung collapse after right thoracentesis on 8/2, esophageal perforation (CT chest on 8/4)With large right-sided hydropneumothorax, complicated by aspiration pneumonia.  Patient underwent right-sided chest tube placement was transferred from [facility] to [facility] where he underwent VATS and decortication by CT surgery as well as esophageal stent placement and PEG tube placement on 8/4-8/5.  Patient has been managed on hospitalist service since.  Did have episodes of persistent leakage from esophagus on esophagram as well as requiring right thorascopic for drainage of loculated pleural effusion on 8/13 by CT surgery,  repeat esophagram on 8/16 did not show any esophageal leak. Currently hospitalist service is following as a consult and CT surgery is primary team  Subjective  Still spiking fevers, tachypnea with increased work of breathing.  Nursing notes patient is holding onto multiple secretions.  T-max 100.9  A & P   Sepsis presumed secondary to right base  pneumonia.  Chest x-ray again notes right base atelectasis/infiltrate, SIRS criteria with fever, tachypnea, tachycardia, and now increasing white count.    Patient previously on empiric antibiotics with vancomycin(8/12-8/17) and Zosyn (8/3-8/18).  Patient is not following pulmonary toilet and tends to pocket his oral secretions, likely at high risk for aspiration pneumonia -Monitor repeat blood cultures -Obtain CT chest with contrast for better imaging regarding acuity of right base/infiltrate any possible effusions -Restart broad spectrum antibiotics given patient is hospitalized with vancomycin and cefepime per sepsis protocol -Monitor CBC, fever/temp trend -Continue aggressive pulmonary toilet with some Xarelto, flutter valve, out of bed to chair  Acute hypoxic respiratory failure of multifactorial etiology (bibasilar pneumonia, loculated parapneumonic effusion/complex on right, suspect ongoing left, right apical pneumothorax). Stable respiratory status on room air.  Chest tube removed on 8/20 with no pneumothorax on repeat chest x-ray -Currently on fluconazole (8/5-for Candida Parapsilosis on pleural effusion studies (05/17/20)  Sepsis secondary to aspiration pneumonia and mediastinitis from prior esophageal rupture,  resolved.  In setting of esophageal rupture.    Esophageal rupture leading to right-sided hydropneumothorax and right-sided complex loculated parapneumonic effusion.  Minimal output from chest tube -Chest tube  removed per CT surgery on 8/20, follow-up post removal chest x-ray with no pneuomthorax --Continue Mucomyst, flutter valve, chest PT-  N.p.o. status in the setting of esophageal rupture -Continue tube feeds -Tolerating clear liquid diet start on 8/19 per CT surgery   Acute on chronic blood loss anemia, stable.  In the setting of blood loss from esophageal rupture and surgery as well as hematemesis during hospitalization.  Hemoglobin currently stable -Monitor  CBC  Hypophosphatemia, resolved. Improved with IV supplementation  Impaired mobility/generalized weakness -PT recommends SNF -Social worker consulted -Monitor CBC, transfuse if hemoglobin less than 7  Intermittent delirium,  resolved.  Likely hospital-acquired given prolonged course.  Currently alert and oriented to place, self, time, context no longer restless or agitated. -Delirium precautions -As needed IV Haldol has been helpful   Paroxysmal atrial fibrillation.  No longer A. fib.   --Cardiology advised discontinuing diltiazem, Continuing metoprolol 50 mg twice daily as well as DOAC when he is able to tolerate  Sinus tachycardia, mild with heart rate in the low 100s not A. fib on my review of telemetry, suspect related to dehydration due to water deficits.  TSH within normal limits, hemoglobin stable.  Likely persistent related to ongoing infection addressed above -Monitor hydration Clear liquid diet, expect improvement with increase oral hydration -Continue to monitor on telemetry  Prior CVA prior to admission -Holding home aspirin 325 - will likely resume Lipitor on discharge, continue to monitor LFTs  Elevated LFTs, continue to downtrend Denies any abdominal pain.  Hepatitis panel unremarkable -Continue to hold home statins  Left hydronephrosis.  Noted on CT scan.  As well as 9 mm obstructing proximal left ureteral stone  urology was consulted during hospitalization and recommended continue original plan of shockwave lithotripsy. this was deferred during current hospitalization due to respiratory status -Urology will arrange close follow-up to get this scheduled,        Family Communication  : Mother updated at bedside on 8/21  Code Status :  FULL  Disposition Plan  :  Patient is from home. Anticipated d/c date: 2 to 3 days. Barriers to d/c or necessity for inpatient status:  Resuming broad-spectrum antibiotics for sepsis presumed secondary to right lower lobe  pneumonia, still needing final disposition for anticoagulation--cardiology recommending DOAC at discharge if able to tolerate,    Procedures  :    DVT Prophylaxis  :  SCDs   Lab Results  Component Value Date   PLT 688 (H) 06/02/2020    Diet :  Diet Order            Diet clear liquid Room service appropriate? Yes; Fluid consistency: Thin  Diet effective now                  Inpatient Medications Scheduled Meds: . bisacodyl  10 mg Oral Daily  . feeding supplement  1 Container Oral BID BM  . feeding supplement (PROSource TF)  45 mL Per Tube BID  . insulin aspart  0-24 Units Subcutaneous TID AC & HS  . mouth rinse  15 mL Mouth Rinse q12n4p  . metoprolol tartrate  50 mg Per Tube BID  . pantoprazole (PROTONIX) IV  40 mg Intravenous Q12H  . sennosides  5 mL Per Tube QHS   Continuous Infusions: . sodium chloride 70 mL/hr at 05/16/20 2122  . sodium chloride    . sodium chloride    . ceFEPime (MAXIPIME) IV    . ceFEPime (MAXIPIME) IV    . feeding supplement (OSMOLITE 1.5 CAL) 1,000 mL (06/02/20 0602)  . fluconazole (DIFLUCAN) IV 400 mg (06/02/20 0920)  . [START ON 06/03/2020] vancomycin    . vancomycin     PRN Meds:.sodium chloride, Place/Maintain arterial line **AND** sodium chloride, Place/Maintain arterial line **AND** sodium chloride, [DISCONTINUED] acetaminophen **OR** acetaminophen, fentaNYL (SUBLIMAZE) injection, hydrALAZINE, ipratropium-albuterol, metoprolol tartrate, ondansetron (ZOFRAN) IV, traMADol  Antibiotics  :   Anti-infectives (From admission, onward)   Start     Dose/Rate Route Frequency Ordered Stop   06/03/20 0200  vancomycin (VANCOCIN) IVPB 1000 mg/200 mL premix        1,000 mg 200  mL/hr over 60 Minutes Intravenous Every 12 hours 06/02/20 1248     06/02/20 2200  ceFEPIme (MAXIPIME) 2 g in sodium chloride 0.9 % 100 mL IVPB        2 g 200 mL/hr over 30 Minutes Intravenous Every 8 hours 06/02/20 1251     06/02/20 2000  piperacillin-tazobactam (ZOSYN) IVPB  3.375 g  Status:  Discontinued        3.375 g 12.5 mL/hr over 240 Minutes Intravenous Every 8 hours 06/02/20 1248 06/02/20 1249   06/02/20 1330  vancomycin (VANCOREADY) IVPB 1250 mg/250 mL        1,250 mg 166.7 mL/hr over 90 Minutes Intravenous  Once 06/02/20 1248     06/02/20 1330  piperacillin-tazobactam (ZOSYN) IVPB 3.375 g  Status:  Discontinued        3.375 g 100 mL/hr over 30 Minutes Intravenous  Once 06/02/20 1248 06/02/20 1249   06/02/20 1215  vancomycin (VANCOCIN) IVPB 1000 mg/200 mL premix  Status:  Discontinued        1,000 mg 200 mL/hr over 60 Minutes Intravenous  Once 06/02/20 1202 06/02/20 1251   06/02/20 1215  ceFEPIme (MAXIPIME) 2 g in sodium chloride 0.9 % 100 mL IVPB        2 g 200 mL/hr over 30 Minutes Intravenous  Once 06/02/20 1202     05/29/20 1800  vancomycin (VANCOCIN) IVPB 1000 mg/200 mL premix  Status:  Discontinued        1,000 mg 200 mL/hr over 60 Minutes Intravenous Every 12 hours 05/29/20 1151 05/30/20 0912   05/24/20 2100  vancomycin (VANCOREADY) IVPB 750 mg/150 mL  Status:  Discontinued        750 mg 150 mL/hr over 60 Minutes Intravenous Every 12 hours 05/24/20 0813 05/29/20 1151   05/24/20 0900  vancomycin (VANCOREADY) IVPB 1500 mg/300 mL        1,500 mg 150 mL/hr over 120 Minutes Intravenous  Once 05/24/20 0813 05/24/20 1221   05/17/20 0900  fluconazole (DIFLUCAN) IVPB 400 mg        400 mg 100 mL/hr over 120 Minutes Intravenous Every 24 hours 05/17/20 0749     05/17/20 0600  vancomycin (VANCOREADY) IVPB 750 mg/150 mL  Status:  Discontinued        750 mg 150 mL/hr over 60 Minutes Intravenous Every 8 hours 05/16/20 2154 05/17/20 0903   05/17/20 0400  vancomycin (VANCOREADY) IVPB 750 mg/150 mL  Status:  Discontinued        750 mg 150 mL/hr over 60 Minutes Intravenous Every 8 hours 05/16/20 1945 05/16/20 2154   05/16/20 2200  vancomycin (VANCOREADY) IVPB 1250 mg/250 mL        1,250 mg 166.7 mL/hr over 90 Minutes Intravenous STAT 05/16/20 2153 05/16/20  2348   05/16/20 2000  vancomycin (VANCOREADY) IVPB 1250 mg/250 mL  Status:  Discontinued        1,250 mg 166.7 mL/hr over 90 Minutes Intravenous  Once 05/16/20 1945 05/16/20 2153   05/15/20 1600  piperacillin-tazobactam (ZOSYN) IVPB 3.375 g  Status:  Discontinued        3.375 g 12.5 mL/hr over 240 Minutes Intravenous Every 8 hours 05/15/20 0945 05/30/20 0912   05/15/20 0930  piperacillin-tazobactam (ZOSYN) IVPB 3.375 g        3.375 g 100 mL/hr over 30 Minutes Intravenous  Once 05/15/20 0921 05/15/20 1009   05/13/20 2000  azithromycin (ZITHROMAX) tablet 500 mg        500 mg Oral Daily 05/13/20  1155 05/14/20 2127   05/10/20 2200  cefTRIAXone (ROCEPHIN) 2 g in sodium chloride 0.9 % 100 mL IVPB  Status:  Discontinued        2 g 200 mL/hr over 30 Minutes Intravenous Every 24 hours 05/10/20 2148 05/15/20 0911   05/10/20 2200  azithromycin (ZITHROMAX) 500 mg in sodium chloride 0.9 % 250 mL IVPB  Status:  Discontinued        500 mg 250 mL/hr over 60 Minutes Intravenous Every 24 hours 05/10/20 2148 05/13/20 1155       Objective   Vitals:   06/02/20 0409 06/02/20 0900 06/02/20 1000 06/02/20 1200  BP:  (!) 152/84 (!) 143/90 (!) 141/84  Pulse:  (!) 112 (!) 114 (!) 104  Resp:  (!) 27 (!) 35 (!) 33  Temp:  100.2 F (37.9 C)  98.1 F (36.7 C)  TempSrc:  Axillary  Axillary  SpO2:  100% 100% 99%  Weight: 58.5 kg     Height:        SpO2: 99 % O2 Flow Rate (L/min): 4 L/min FiO2 (%): 21 %  Wt Readings from Last 3 Encounters:  06/02/20 58.5 kg  05/09/20 69.4 kg  12/13/19 68.2 kg     Intake/Output Summary (Last 24 hours) at 06/02/2020 1253 Last data filed at 06/02/2020 1200 Gross per 24 hour  Intake 1989.34 ml  Output 881 ml  Net 1108.34 ml    Physical Exam:     Awake Alert, Oriented X 3, flat affect Home air, increased work of breathing but no acute respiratory distress, diminished breath sounds on right side, no wheezing or rhonchi Abdomen soft, nondistended, PEG tube in  place Tachycardic, normal rhythm,No Gallops,Rubs or new Murmurs,     I have personally reviewed the following:   Data Reviewed:  CBC Recent Labs  Lab 05/29/20 0033 05/30/20 0102 05/31/20 0047 06/01/20 0019 06/02/20 0038  WBC 22.2* 12.4* 14.6* 15.3* 21.2*  HGB 8.6* 9.0* 9.4* 9.4* 9.4*  HCT 27.8* 29.2* 30.8* 30.8* 30.3*  PLT 653* 719* 704* 739* 688*  MCV 86.9 88.0 86.3 85.6 86.6  MCH 26.9 27.1 26.3 26.1 26.9  MCHC 30.9 30.8 30.5 30.5 31.0  RDW 15.0 14.9 14.8 14.8 15.2  LYMPHSABS 1.2 0.9 0.9 0.9 1.1  MONOABS 1.3* 0.7 0.8 0.8 1.2*  EOSABS 0.2 0.4 0.1 0.0 0.1  BASOSABS 0.1 0.1 0.1 0.1 0.1    Chemistries  Recent Labs  Lab 05/28/20 0107 05/28/20 0107 05/29/20 0033 05/30/20 0102 05/31/20 0047 06/01/20 0019 06/02/20 0038  NA 136   < > 136 134* 133* 131* 134*  K 4.3   < > 4.3 4.3 5.0 4.4 4.5  CL 105   < > 106 102 100 100 103  CO2 21*   < > 20* 24 24 22 24   GLUCOSE 141*   < > 140* 127* 140* 158* 151*  BUN 10   < > 12 10 11 14 18   CREATININE 0.82   < > 0.76 0.69 0.78 0.79 0.90  CALCIUM 8.0*   < > 8.1* 8.3* 8.8* 8.6* 8.5*  MG 2.1  --   --   --   --   --   --   AST 65*   < > 37 61* 67* 57* 37  ALT 247*   < > 157* 172* 198* 167* 130*  ALKPHOS 252*   < > 210* 233* 250* 266* 262*  BILITOT 0.3   < > 0.4 0.4 0.3 0.5 0.4   < > =  values in this interval not displayed.   ------------------------------------------------------------------------------------------------------------------ No results for input(s): CHOL, HDL, LDLCALC, TRIG, CHOLHDL, LDLDIRECT in the last 72 hours.  Lab Results  Component Value Date   HGBA1C 5.5 07/12/2018   ------------------------------------------------------------------------------------------------------------------ No results for input(s): TSH, T4TOTAL, T3FREE, THYROIDAB in the last 72 hours.  Invalid input(s): FREET3 ------------------------------------------------------------------------------------------------------------------ No  results for input(s): VITAMINB12, FOLATE, FERRITIN, TIBC, IRON, RETICCTPCT in the last 72 hours.  Coagulation profile No results for input(s): INR, PROTIME in the last 168 hours.  No results for input(s): DDIMER in the last 72 hours.  Cardiac Enzymes No results for input(s): CKMB, TROPONINI, MYOGLOBIN in the last 168 hours.  Invalid input(s): CK ------------------------------------------------------------------------------------------------------------------    Component Value Date/Time   BNP 68.0 05/14/2020 0604    Micro Results Recent Results (from the past 240 hour(s))  Aerobic/Anaerobic Culture (surgical/deep wound)     Status: None   Collection Time: 05/25/20  9:03 AM   Specimen: PATH Other; Body Fluid  Result Value Ref Range Status   Specimen Description FLUID  Final   Special Requests EMPYEMA SPEC A  Final   Gram Stain   Final    ABUNDANT WBC PRESENT, PREDOMINANTLY PMN NO ORGANISMS SEEN    Culture   Final    RARE CANDIDA PARAPSILOSIS NO ANAEROBES ISOLATED Performed at [facility], [place] [address], [place], [place] [address]    Report Status 05/30/2020 FINAL  Final  Culture, blood (routine x 2)     Status: None (Preliminary result)   Collection Time: 06/01/20 10:28 AM   Specimen: BLOOD LEFT ARM  Result Value Ref Range Status   Specimen Description BLOOD LEFT ARM  Final   Special Requests   Final    BOTTLES DRAWN AEROBIC ONLY Performed at [facility], [place] [address], [place], [place] [address]    Culture PENDING  Incomplete   Report Status PENDING  Incomplete    Radiology Reports DG Chest 1 View  Result Date: 05/26/2020 CLINICAL DATA:  Esophageal anastomotic leak EXAM: CHEST  1 VIEW COMPARISON:  05/25/2020 FINDINGS: Right chest tubes and esophageal stent are again noted. There is new near total opacification of the right hemithorax. Right pleural effusion is present. Left lung remains clear. No pneumothorax. Cardiomediastinal contours are partially  obscured. There is mild rightward mediastinal shift. IMPRESSION: New near total opacification of the right hemithorax likely reflecting combination of effusion and atelectasis. Electronically Signed   By: Macy Mis M.D.   On: 05/26/2020 08:21   DG Chest 1 View  Result Date: 05/25/2020 CLINICAL DATA:  Esophageal stent placement. EXAM: DG C-ARM 1-60 MIN; CHEST  1 VIEW FLUOROSCOPY TIME:  Fluoro time: 4 Minutes 17 secondsRadiation exposure index: MGy: 41.69Number of acquired spot images: Five spot fluoro graphic images. COMPARISON:  May 17, 2020.  May 23, 2020 FINDINGS: Spot fluoroscopy images were obtained for surgical planning purposes. Esophageal stent is seen traversing the thorax. Enteric contrast traverses the stent and enters the proximal stomach. None of the provided radiographs demonstrate evaluation of the site of previous leak into the RIGHT pleural space. Selective LEFT mainstem intubation. IMPRESSION: Spot fluoroscopy images from surgical planning purposes. Please reference procedure report for further details. Electronically Signed   By: Valentino Saxon MD   On: 05/25/2020 10:59   DG Chest 1 View  Result Date: 05/17/2020 CLINICAL DATA:  Surgery. Esophageal stent placement. EXAM: DG C-ARM 1-60 MIN; CHEST  1 VIEW FLUOROSCOPY TIME:  Fluoroscopy Time:  5 minutes 48 seconds Radiation  Exposure Index (if provided by the fluoroscopic device): 59.06 mGy Number of Acquired Spot Images: 3 COMPARISON:  Preprocedural CT yesterday. FINDINGS: Three fluoroscopic spot views obtained in the operating room. Endoscope is in place. A stent overlies the endoscope with tip projecting over the esophageal diaphragmatic hiatus. Subsequent enteric tube courses in the stent. There is contrast opacifying the distal stent on image 1. Right chest tube is in place. IMPRESSION: Intraoperative fluoroscopy during esophageal stent placement. Please reference procedural report for details. Electronically Signed   By:  Keith Rake M.D.   On: 05/17/2020 19:55   DG Chest 1 View  Result Date: 05/14/2020 CLINICAL DATA:  Complicated RIGHT pleural effusion post diagnostic thoracentesis EXAM: CHEST  1 VIEW COMPARISON:  05/10/2020 FINDINGS: Enlargement of cardiac silhouette. Mediastinal contours and pulmonary vascularity normal. Persistent RIGHT pleural effusion and significant atelectasis and/or consolidation of the mid to lower RIGHT lung. New lucency at the RIGHT apex, suspect representing apical pneumothorax. No mediastinal shift. Patient asymptomatic. Persistent infiltrate LEFT lower lobe and tiny LEFT pleural effusion. IMPRESSION: Probable RIGHT apical pneumothorax post diagnostic thoracentesis. Persistent complicated partially loculated RIGHT pleural effusion, RIGHT basilar atelectasis versus consolidation, and LEFT lower lobe consolidation. Patient asymptomatic. Findings discussed with Dr. Elsworth Soho on 05/14/2020 at 1545 hours. Electronically Signed   By: Lavonia Dana M.D.   On: 05/14/2020 15:51   CT CHEST WO CONTRAST  Result Date: 05/16/2020 CLINICAL DATA:  Pleural effusion. EXAM: CT CHEST WITHOUT CONTRAST TECHNIQUE: Multidetector CT imaging of the chest was performed following the standard protocol without IV contrast. COMPARISON:  CT dated May 10, 2020 FINDINGS: Cardiovascular: The heart size is stable. There is some shift of the mediastinum to the left. There is a trace pericardial effusion. Mild atherosclerotic changes are noted of the thoracic aorta. Mediastinum/Nodes: --mild mediastinal adenopathy is noted -- there is mild bilateral hilar adenopathy. -- No axillary lymphadenopathy. -- No supraclavicular lymphadenopathy. --there is a left-sided thyroid nodule measuring approximately 2.4 cm. -the esophagus is diffusely thickened. There appears to be a defect in the right lateral aspect of the mid to distal esophagus (axial series 3, image 82-83). Adjacent to this defect is extraluminal oral contrast tracking into the  right pleural space. Lungs/Pleura: There is been interval placement of a right-sided chest tube. There is a large right-sided hydropneumothorax which has increased in size since the patient's prior study despite placement of a right-sided chest tube. Additionally. The pleural effusion component appears more complex than prior study. There is new extensive debris within bronchus intermedius as well as the right lower lobe and right middle lobe bronchi consistent with significant interval aspiration. The right middle lobe and right lower lobe are essentially collapsed. There are hypoattenuating components within the right middle and right lower lobes concerning for developing aspiration pneumonia. There are new areas of consolidation involving the left lower and left upper lobes concerning for aspiration. There is a small to moderate-sized left-sided pleural effusion. The chest tube is kinked at its distal most aspect. Upper Abdomen: There is no definite acute abnormality in the upper abdomen. Musculoskeletal: No chest wall abnormality. No bony spinal canal stenosis. IMPRESSION: 1. Overall findings consistent with esophageal perforation arising from the right lateral aspect of the mid to distal esophagus as detailed above. There is extraluminal oral contrast tracking from this esophageal wall defect into the right pleural space. 2. Large right-sided hydropneumothorax with significant interval worsening since the prior study despite right-sided chest tube placement. Additionally, the fluid component appears more complex. The right-sided chest  tube appears to enter the thoracic cavity inferiorly and courses along the right hemidiaphragm and terminates in the region of the right epicardial fat pad. The tube is kinked at its tip. While the tube is likely intrathoracic, it will need significant repositioning to be effective. 3. Findings consistent with interval aspiration with complete occlusion of bronchus intermedius as  well as the right middle and right lower lobe bronchi. There is likely developing aspiration pneumonia within the collapsed right middle and right lower lobes as well as the posterior left upper and left lower lobes. There is a small left-sided pleural effusion. 4. Persistent diffuse esophageal wall thickening of unknown clinical significance. Correlation with endoscopy is recommended as an underlying mass is not excluded. 5. Again noted is a left-sided thyroid nodule measuring approximately 2.4 cm. Outpatient thyroid ultrasound follow-up is recommended.(Ref: J Am Coll Radiol. 2015 Feb;12(2): 143-50). These results were called by telephone at the time of interpretation on 05/16/2020 at 6:04 pm to provider [facility] , who verbally acknowledged these results. Electronically Signed   By: Constance Holster M.D.   On: 05/16/2020 18:04   CT CHEST W CONTRAST  Result Date: 05/24/2020 CLINICAL DATA:  Esophageal perforation, progressive leukocytosis, abnormal chest x-ray, dyspnea EXAM: CT CHEST WITH CONTRAST TECHNIQUE: Multidetector CT imaging of the chest was performed during intravenous contrast administration. CONTRAST:  31mL OMNIPAQUE IOHEXOL 350 MG/ML SOLN COMPARISON:  Chest radiograph 05/22/2020, CT 05/10/2020 FINDINGS: Cardiovascular: Extensive coronary artery calcification. Global cardiac size within normal limits. Trace pericardial fluid is likely physiologic in is stable since prior examination. Thoracic aorta is age-appropriate. Mediastinum/Nodes: And endoluminal stent graft has been placed within the esophagus extending from the level of the sternal notch within the proximal esophagus through the mid to distal esophagus at the level of the left atrium. The stent graft is not completely appose the esophageal lumen proximally, best seen on axial image # 27/3. Additionally, there is a defect involving the right posterolateral aspect of the esophagus, best seen on axial image # 77/3, with an adjacent loculated  collection of gas, fluid, and extravasated contrast measuring 4.2 x 7.6 x 8.4 cm on axial image # 82 and sagittal image # 77. The distal margin of the stent graft does appear to completely apposed the esophageal lumen, though this is extremely close to the point of perforation. The distal esophagus again demonstrates marked circumferential wall thickening in keeping with esophagitis. Numerous shotty enhancing right paratracheal lymph nodes are identified, likely reactive. No pathologic thoracic adenopathy. Largely cystic nodule again identified within the left thyroid lobe, unchanged. Lungs/Pleura: Right chest tube is in place extending posteriorly and apically. There is collapse and consolidation of the right middle and lower lobes. There is partially loculated gas and fluid within the right pleural space laterally and within the major inter lobar fissure. The pleural fluid component is relatively small and communicates with the chest tube and has significantly decreased in volumes since prior examination, both in its free-flowing component as well as the a loculated component within the inter lobar fissure. The previously noted extravasated collection, however, does appear isolated from the chest tube. Bronchial wall thickening within the aerated right upper lobe is in keeping with airway inflammation. The central airways of the right lower lobe appear impacted and fluid filled. Minimal infiltrate is seen within the basilar lingula and left lower lobe, infectious or inflammatory in nature. No central obstructing mass. No pneumothorax or pleural effusion on the left. Upper Abdomen: Left hydronephrosis is partially visualized, incompletely assessed  on this examination. This appears similar to that noted on prior examination of 05/13/2020. Musculoskeletal: No acute bone abnormality. IMPRESSION: Interval placement of right chest tube with significant evacuation loculated right pleural fluid. A small free-flowing fluid  in gas component remains as well as a small partially loculated component within the inter lobar fissure. Interval placement of an and luminal stent graft within the esophagus with incomplete apposition of the graft proximally likely resulting in contrast coursing between the stent graft and the esophageal lumen to the point of leakage noted on prior esophagram. Pilar Plate perforation of the right posterolateral mid to distal esophagus again noted. Associated loculated extravasated contrast and fluid collection within the right pleural space appears isolated from the adjacent right chest tube. Collapse and consolidation of the right middle and right lower lobe with extensive fluid opacification of the right lower lobe airways. Airway inflammation involving the right upper lobe and probable minimal infectious or inflammatory infiltrate within the left lung base. Left hydronephrosis, similar to that noted on prior examination, incompletely evaluated on this examination. Electronically Signed   By: Fidela Salisbury MD   On: 05/24/2020 15:21   CT Angio Chest PE W and/or Wo Contrast  Result Date: 05/11/2020 CLINICAL DATA:  Shortness of breath and weakness for several days EXAM: CT ANGIOGRAPHY CHEST WITH CONTRAST TECHNIQUE: Multidetector CT imaging of the chest was performed using the standard protocol during bolus administration of intravenous contrast. Multiplanar CT image reconstructions and MIPs were obtained to evaluate the vascular anatomy. CONTRAST:  69mL OMNIPAQUE IOHEXOL 350 MG/ML SOLN COMPARISON:  Chest x-ray from earlier in the same day, CTA from 07/12/2018. FINDINGS: Cardiovascular: Thoracic aorta demonstrates atherosclerotic calcifications. No aneurysmal dilatation or dissection is noted. No cardiac enlargement is seen. Coronary calcifications are noted. The pulmonary artery shows a normal branching pattern. No definitive filling defect is identified to suggest pulmonary embolism. Mediastinum/Nodes: Thoracic  inlet shows a hypodensity within the left lobe of the thyroid measuring 17 mm in greatest dimension. This is relatively stable from a prior CT examination from 2019. No sizable hilar or mediastinal adenopathy is noted. The esophagus demonstrates some diffuse wall thickening which may be related to reflux esophagitis. Lungs/Pleura: Bilateral moderate pleural effusions are seen. Associated lower lobe consolidation is noted. No sizable parenchymal nodules are seen. No pneumothorax is noted. Upper Abdomen: Visualized upper abdomen shows geographic decreased attenuation within the left kidney and enlargement of the left kidney. This may simply be related to hydronephrosis. The possibility of an infiltrating mass deserves consideration. This can be further evaluated with CT of the abdomen and pelvis with contrast. Musculoskeletal: No acute bony abnormality is noted. Review of the MIP images confirms the above findings. IMPRESSION: No evidence of pulmonary emboli. Bilateral pleural effusions and lower lobe consolidation similar to that seen on recent chest x-ray. This is consistent with bibasilar pneumonia and reactive effusions. Diffuse wall thickening throughout the esophagus likely related to reflux esophagitis. Clinical correlation is recommended. It should be noted this is stable from a prior exam of 2019. Enlargement of the left kidney with decreased attenuation identified. Although this may simply represent hydronephrosis possibility of an underlying infiltrative mass deserves consideration. CT of the abdomen and pelvis with contrast is recommended. 17 mm left thyroid nodule. This is roughly stable from the prior CT examination from 2019 and likely benign. Recommend thyroid US (ref: J Am Coll Radiol. 2015 Feb;12(2): 143-50). Electronically Signed   By: Inez Catalina M.D.   On: 05/11/2020 00:00   CT ABDOMEN PELVIS  W CONTRAST  Result Date: 05/13/2020 CLINICAL DATA:  LEFT renal mass. Suspicion for renal mass on prior  chest CT. EXAM: CT ABDOMEN AND PELVIS WITH CONTRAST TECHNIQUE: Multidetector CT imaging of the abdomen and pelvis was performed using the standard protocol following bolus administration of intravenous contrast. CONTRAST:  137mL OMNIPAQUE IOHEXOL 300 MG/ML  SOLN COMPARISON:  Chest CT of May 10, 2020 FINDINGS: Lower chest: Loculated bilateral basilar effusions with similar appearance when compared to the study of May 10, 2020. Basilar airspace disease with increasing airspace disease particularly at the LEFT lung base. Hiatal hernia and patulous distal esophagus. Calcified coronary artery disease partially imaged. No pericardial effusion. Hepatobiliary: Liver without focal, suspicious lesion. Portal vein is patent. No pericholecystic stranding. No biliary duct dilation. Pancreas: Pancreas is normal without peripancreatic stranding, ductal dilation or inflammation. Spleen: Spleen normal in size and contour. Adrenals/Urinary Tract: Adrenal glands are normal. Moderate to marked LEFT-sided hydroureteronephrosis with overlying cortical thinning. Large calculus in the proximal LEFT ureter measuring 9 x 8 x 8 mm. No perinephric abscess. Urinary bladder is normal. Normal enhancement of the RIGHT kidney. Enhancement of the kidneys despite scarring and dilation is relatively symmetric and there is an additional small calculus to moderate sized calculus in the lower pole the LEFT kidney. Area with limited assessment due to motion artifact. Stomach/Bowel: Hiatal hernia and some distal esophageal thickening as described. Small duodenal diverticulum in the second portion of the duodenum. No acute gastrointestinal process. Appendix not visualized. Motion artifact in this area limiting assessment but no gross findings of inflammation that would indicate pericecal stranding or secondary signs of acute appendicitis. Colonic interposition along the anterior portion of the liver. No pericolonic stranding. No perirectal stranding.  Vascular/Lymphatic: Calcific and noncalcific atheromatous plaque of the abdominal aorta. No aneurysmal dilation. No adenopathy in the retroperitoneum or upper abdomen. No pelvic lymphadenopathy. Reproductive: Heterogeneous prostate. Calcifications of the vas deferens. Other: No ascites. Musculoskeletal: No acute or destructive bone finding. Spinal degenerative changes. IMPRESSION: 1. Moderate to marked hydronephrosis due to calculus in the proximal to mid LEFT ureter. Urologic consultation and correlation with urine studies to exclude concomitant infection is suggested. 2. Cortical thinning that is seen on the LEFT and preservation of renal enhancement suggests that an element of this process could potentially be chronic. 3. Loculated effusions and worsening of LEFT lower lobe airspace disease that may represent a combination of volume loss and pneumonitis 4. Loculated bilateral basilar effusions with similar appearance when compared to the study of May 10, 2020. Basilar airspace disease with increasing airspace disease particularly at the LEFT lung base. Findings may represent worsening pneumonia or aspiration. 5. Hiatal hernia and patulous distal esophagus. 6. Aortic atherosclerosis. Aortic Atherosclerosis (ICD10-I70.0). Electronically Signed   By: Zetta Bills M.D.   On: 05/13/2020 12:52   DG CHEST PORT 1 VIEW  Result Date: 06/02/2020 CLINICAL DATA:  Tachypnea EXAM: PORTABLE CHEST 1 VIEW COMPARISON:  June 01, 2020 FINDINGS: An esophageal stent is again identified. Stable right chest tube. No pneumothorax. Opacity and effusion remain in the right base. No other interval changes. IMPRESSION: 1. Small right effusion underlying opacity. The opacity could represent atelectasis or infiltrate. No other changes. Electronically Signed   By: Dorise Bullion III M.D   On: 06/02/2020 12:41   DG CHEST PORT 1 VIEW  Result Date: 06/01/2020 CLINICAL DATA:  Chest tube. Shortness of breath. EXAM: PORTABLE CHEST 1 VIEW  COMPARISON:  May 29, 2020 FINDINGS: Since the prior study the right apical chest tube  seen on the prior study has been removed. The additional right-sided chest tube seen on the prior exam is stable in position. Persistent moderate to marked severity right basilar atelectasis and/or infiltrate is noted. There is a small right pleural effusion. A small amount of right apical pleural fluid is also suspected. No residual pneumothorax is identified. The cardiac silhouette is mildly enlarged. A radiopaque stent is seen overlying the mid and distal esophagus. The visualized skeletal structures are unremarkable. IMPRESSION: 1. Interval right apical chest tube removal without evidence of a residual pneumothorax. 2. Persistent moderate to marked severity right basilar atelectasis and/or infiltrate. 3. Small right pleural effusion. Electronically Signed   By: Virgina Norfolk M.D.   On: 06/01/2020 16:11   DG Chest Port 1 View  Result Date: 05/29/2020 CLINICAL DATA:  Chest tube.  Shortness of breath. EXAM: PORTABLE CHEST 1 VIEW COMPARISON:  05/28/2020. FINDINGS: Two right chest tubes in stable position. Esophageal stent in stable position. Stable cardiomegaly. Progressive dense right base atelectasis/infiltrate. Persistent right mid lung field infiltrate. Small right pleural effusion, improved from prior exam. No pneumothorax. IMPRESSION: 1. Two right chest tubes in stable position. Esophageal stent stable position. No pneumothorax. 2.  Stable cardiomegaly. 3. Progressive dense right base atelectasis/infiltrate. Persistent right mid lung field infiltrate. 3.  Small right pleural effusion, improved from prior exam. Electronically Signed   By: Marcello Moores  Register   On: 05/29/2020 07:38   DG Chest Port 1 View  Result Date: 05/28/2020 CLINICAL DATA:  Pleural effusion, chest tube EXAM: PORTABLE CHEST 1 VIEW COMPARISON:  05/27/2020 FINDINGS: There are two right-sided chest tubes. Esophageal stent is again noted. Improved  aeration of the right lung with persistent but decreased right pleural effusion. No pneumothorax. Cardiomediastinal contours are partially obscured IMPRESSION: Improved aeration of right lung since 05/27/2020 with persistent but decreased right pleural effusion. Electronically Signed   By: Macy Mis M.D.   On: 05/28/2020 09:53   DG Chest Port 1 View  Result Date: 05/27/2020 CLINICAL DATA:  Shortness of breath EXAM: PORTABLE CHEST 1 VIEW COMPARISON:  05/26/2020 FINDINGS: Cardiac shadow is stable. Esophageal stent is again noted and stable. Two right-sided chest tubes are seen in satisfactory position. The overall appearance is stable. Persistent opacification of the right hemithorax is again noted similar to that seen on recent plain film evaluation. No new focal abnormality is noted. IMPRESSION: Stable appearance of the chest with consolidation in the right hemithorax. Electronically Signed   By: Inez Catalina M.D.   On: 05/27/2020 07:22   DG Chest Port 1 View  Result Date: 05/25/2020 CLINICAL DATA:  Leak EXAM: PORTABLE CHEST 1 VIEW COMPARISON:  May 23, 2020 FINDINGS: The cardiomediastinal silhouette is unchanged in contour.RIGHT-sided chest tubes. Moderate RIGHT pleural effusion. Esophageal stent projects over the mediastinum inferiorly. No pneumothorax. Persistent RIGHT-sided heterogeneous opacities are not significantly changed in comparison to prior. Minimal opacities of the LEFT lung. No acute osseous abnormality. IMPRESSION: Esophageal stent projects over the inferior mediastinum. Electronically Signed   By: Valentino Saxon MD   On: 05/25/2020 11:57   DG CHEST PORT 1 VIEW  Result Date: 05/22/2020 CLINICAL DATA:  59 year old male with perforated esophagus status post surgical exploration, esophageal stent placement and percutaneous gastrostomy tube placement postoperative day 5. Shortness of breath. EXAM: PORTABLE CHEST 1 VIEW COMPARISON:  Portable chest 0535 hours today and earlier.  FINDINGS: Portable AP semi upright view at 2330 hours. Stable mediastinal contours. Esophageal stent configuration appears stable since 05/21/2020. But there is increasing confluent right  lung base opacity. A single right right chest tube remains in place. No pneumothorax identified. The left lung remains clear allowing for portable technique. Visualized tracheal air column is within normal limits. IMPRESSION: 1. Increasing right lung base opacity since yesterday, indeterminate for pleural fluid versus consolidation. 2. Single right chest tube remains in place, no pneumothorax. 3. Esophageal stent configuration appears stable. Electronically Signed   By: Genevie Ann M.D.   On: 05/22/2020 23:39   DG CHEST PORT 1 VIEW  Result Date: 05/22/2020 CLINICAL DATA:  Follow-up pneumothorax EXAM: PORTABLE CHEST 1 VIEW COMPARISON:  05/21/2020 FINDINGS: Cardiac shadow is stable. Esophageal stent is again noted and stable. Right thoracostomy catheter is seen without evidence of pneumothorax. Right basilar atelectasis and small effusion are seen. The left lung is clear. IMPRESSION: No pneumothorax is noted. Small right pleural effusion and basilar atelectasis. Electronically Signed   By: Inez Catalina M.D.   On: 05/22/2020 08:03   DG Chest Port 1 View  Result Date: 05/21/2020 CLINICAL DATA:  Postop from repair of esophageal rupture. EXAM: PORTABLE CHEST 1 VIEW COMPARISON:  05/20/2020 FINDINGS: An endoluminal stent is seen within the thoracic esophagus. 2 right-sided chest tubes remain in place. Small approximately 10% right pneumothorax is noted, which appears slightly increased in size since previous study. Bibasilar atelectasis is again seen, right side greater than left, without significant change. Mild cardiomegaly remains stable. IMPRESSION: Small approximately 10% right pneumothorax, slightly increased in size since prior study. Stable bibasilar atelectasis, right side greater than left. Electronically Signed   By: Marlaine Hind M.D.   On: 05/21/2020 08:07   DG Chest Port 1 View  Result Date: 05/20/2020 CLINICAL DATA:  Shortness of breath. RIGHT-sided chest tube present. EXAM: PORTABLE CHEST 1 VIEW COMPARISON:  05/19/2020 FINDINGS: Patient has dual RIGHT-sided chest tubes, unchanged in position. There is no pneumothorax. There is persistent RIGHT pleural effusion and significant opacity at the RIGHT lung base which partially obscures the hemidiaphragm. Subsegmental atelectasis at the LEFT lung base. Stable appearance of esophageal stent. IMPRESSION: 1. Stable appearance of the chest. 2. No pneumothorax. 3. Persistent RIGHT basilar opacity and pleural effusion. Electronically Signed   By: Nolon Nations M.D.   On: 05/20/2020 09:51   DG CHEST PORT 1 VIEW  Result Date: 05/19/2020 CLINICAL DATA:  Empyema EXAM: PORTABLE CHEST 1 VIEW COMPARISON:  05/18/2020 FINDINGS: Two right apical chest tubes.  No pneumothorax is seen. Patchy right mid/lower lung opacities, reflecting a combination of pneumonia and known complex right pleural fluid collection/empyema. Mild patchy opacity in the left mid/lower lung, likely reflecting pneumonia. Stable esophageal stent. Cardiomegaly. IMPRESSION: Two right apical chest tubes.  No pneumothorax is seen. Multifocal pneumonia, grossly unchanged. Superimposed complex right pleural fluid collection/empyema. Esophageal stent. Electronically Signed   By: Julian Hy M.D.   On: 05/19/2020 07:12   DG CHEST PORT 1 VIEW  Result Date: 05/18/2020 CLINICAL DATA:  Esophageal perforation. EXAM: PORTABLE CHEST 1 VIEW COMPARISON:  05/17/2020. FINDINGS: Esophageal stent stable position. Two right chest tubes in stable position. Stable cardiomegaly. No pulmonary venous congestion. Low lung volumes with bibasilar atelectasis. Bilateral pulmonary infiltrates/edema again noted. Interim slight increase in right-sided pleural effusion. No pneumothorax noted on today's exam. IMPRESSION: 1. Esophageal stent and 2  right chest tubes noted in stable position. No pneumothorax noted on today's exam. Interim slight increase in right pleural effusion. 2. Low lung volumes with bibasilar atelectasis. Persistent bilateral pulmonary infiltrates/edema without interim change. 3.  Stable cardiomegaly.  No pulmonary venous congestion  Electronically Signed   By: Marcello Moores  Register   On: 05/18/2020 05:41   DG Chest Port 1 View  Result Date: 05/17/2020 CLINICAL DATA:  Esophageal stent placement. EXAM: PORTABLE CHEST 1 VIEW COMPARISON:  Chest CT yesterday. FINDINGS: Interval placement of esophageal stent. Two right chest tubes in place. Decreased right hydropneumothorax. Persistent pleural fluid in extrapleural air at the right lung base. Patchy opacity in the right mid lower lung zone. Small left pleural effusion and basilar opacities. Heart size grossly stable. There is contrast within the stomach. IMPRESSION: 1. Interval placement of esophageal stent. 2. Two right chest tubes in place with decreased right hydropneumothorax. Small volume of residual pleural fluid in extrapleural air at the right lung base. 3. Patchy opacity in the right mid and lower lung zone, may represent atelectasis or re-expansion pulmonary edema. 4. Small left pleural effusion and basilar opacities. Electronically Signed   By: Keith Rake M.D.   On: 05/17/2020 19:57   DG CHEST PORT 1 VIEW  Result Date: 05/16/2020 CLINICAL DATA:  Chest tube insertion for pneumothorax. EXAM: PORTABLE CHEST 1 VIEW COMPARISON:  May 16, 2020 FINDINGS: A right-sided chest tube is noted. Its distal end is kinked and is seen overlying the lateral aspect of the right upper quadrant. A large, stable right-sided hydropneumothorax is seen with subsequent collapse of the right lung. This is unchanged in appearance when compared to the prior study. Stable mild to moderate severity atelectasis and/or infiltrate is noted within the left lung base. The heart size and mediastinal contours are  within normal limits. The visualized skeletal structures are unremarkable. IMPRESSION: 1. Stable large right-sided hydropneumothorax with subsequent collapse of the right lung. 2. Stable mild to moderate severity left basilar atelectasis and/or infiltrate. 3. Right-sided chest tube with its distal end kinked and seen overlying the right upper quadrant. Electronically Signed   By: Virgina Norfolk M.D.   On: 05/16/2020 16:52   DG CHEST PORT 1 VIEW  Result Date: 05/16/2020 CLINICAL DATA:  Pneumothorax post recent thoracentesis, shortness of breath EXAM: PORTABLE CHEST 1 VIEW COMPARISON:  Portable exam 0849 hours compared to 05/14/2020 FINDINGS: Stable heart size and mediastinal contours. RIGHT hydropneumothorax, with increased collapse of RIGHT lung since previous exam. Minimal LEFT pleural effusion with atelectasis versus infiltrate LEFT lower lobe. LEFT upper lung clear. Bones demineralized. IMPRESSION: RIGHT hydropneumothorax with increased collapse of RIGHT lung since previous exam. Minimal LEFT pleural effusion with atelectasis versus infiltrate at LEFT lower lobe. Findings called to Dr. Elsworth Soho on 05/16/2020 at 0922 hours. Electronically Signed   By: Lavonia Dana M.D.   On: 05/16/2020 09:38   DG Chest Port 1 View  Result Date: 05/10/2020 CLINICAL DATA:  Questionable sepsis.  Pneumonia diagnosis yesterday. EXAM: PORTABLE CHEST 1 VIEW COMPARISON:  Radiograph yesterday. FINDINGS: Upper normal heart size. Unchanged mediastinal contours. Hazy symmetric bibasilar opacities are unchanged from radiograph yesterday. No pulmonary edema. No pneumothorax. IMPRESSION: Unchanged symmetric bibasilar opacities are unchanged from yesterday, likely combination of pleural effusions and atelectasis/pneumonia. Electronically Signed   By: Keith Rake M.D.   On: 05/10/2020 21:24   DG Chest Port 1 View  Result Date: 05/09/2020 CLINICAL DATA:  Cough and fever. EXAM: PORTABLE CHEST 1 VIEW COMPARISON:  09/06/2018 FINDINGS:  Heart size is normal. There are bilateral pleural effusions and infiltrate/atelectasis in both lower lungs, consistent with pneumonia. Upper lungs are clear. IMPRESSION: Bilateral lower lobe atelectasis and pneumonia.  Small effusions. Electronically Signed   By: Nelson Chimes M.D.   On: 05/09/2020 14:16  DG Chest Port 1V same Day  Result Date: 05/14/2020 CLINICAL DATA:  Follow-up right-sided pneumothorax. EXAM: PORTABLE CHEST 1 VIEW COMPARISON:  May 14, 2020 (3:02 p.m.) FINDINGS: Stable moderate to marked severity atelectasis and/or infiltrate is noted within the right lung base with stable mild to moderate severity left basilar atelectasis and/or infiltrate seen. There is a small, stable left pleural effusion with a stable moderate sized right pleural effusion. A stable moderate sized right apical pneumothorax is seen. A mild amount of radiopaque contrast is again seen along the infrahilar region on the right. The heart size and mediastinal contours are within normal limits. The visualized skeletal structures are unremarkable. IMPRESSION: No significant interval change when compared to the prior study dated May 14, 2020 (3:02 p.m.) Electronically Signed   By: Virgina Norfolk M.D.   On: 05/14/2020 19:27   DG Swallowing Func-Speech Pathology  Result Date: 05/14/2020 Objective Swallowing Evaluation: Type of Study: MBS-Modified Barium Swallow Study  Patient Details Name: Evan Chavez MRN: [id number] Date of Birth: 1960/10/23 Today's Date: 05/14/2020 Time: SLP Start Time (ACUTE ONLY): 56 -SLP Stop Time (ACUTE ONLY): 1300 SLP Time Calculation (min) (ACUTE ONLY): 30 min Past Medical History: Past Medical History: Diagnosis Date . Cocaine abuse ([place])  . ETOH abuse  . GERD (gastroesophageal reflux disease)  . Hyperlipidemia  . 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 BIOPSY  08/25/2018  Procedure: BIOPSY;  Surgeon:  [name], MD;  [facility]: [facility];  Service: Endoscopy;; . BIOPSY  12/13/2018  Procedure: BIOPSY;  Surgeon: [name], MD;  Location: [facility];  Service: Endoscopy;;  esophagus . COLONOSCOPY WITH PROPOFOL N/A 04/07/2019  Dr. [name]: 3 Tubular adenomas removed (4 to 8 mm in size), next colonoscopy 3 years. . ESOPHAGOGASTRODUODENOSCOPY (EGD) WITH PROPOFOL N/A 08/25/2018  Dr. [name]: Severely inflamed abnormal appearing mid/distal esophagus.  Encroachment on lumen suspicious for infiltrating neoplasm located be all from severe benign inflammation, extensive gastric erosions and extensive duodenal ulcerations.  Query ischemic process versus other.  Biopsy revealed Barrett's esophagus but negative for malignancy and H. pylori. . ESOPHAGOGASTRODUODENOSCOPY (EGD) WITH PROPOFOL N/A 12/13/2018  Dr. [name]: peptic stricture s/p balloon dilatation. abnormal esophageal mucosa, by c/w barrett's without dysplasia, previous PUD completely healed.  Marland Kitchen POLYPECTOMY  04/07/2019  Procedure: POLYPECTOMY;  Surgeon: [name], MD;  Location: [facility];  Service: Endoscopy;;  colon HPI: 59 y.o. male with medical history significant for prior stroke, hyperlipidemia, GERD, BPH, barrett's esophagus    and remote history of cocaine abuse who presents to the emergency department due to 5 days of weakness, complains of productive cough with clear phlegm with occasional black speckles with associated reproducible midsternal chest pain. Patient was seen in the ED 2 days ago (7/28) due to similar symptoms, during which he reported a max temperature of 100F.  Chest x-ray done at that time showed pneumonia and patient was treated with IV fluid.  Amoxicillin and doxycycline were prescribed on the was advised to follow-up with his primary care physician.  Patient returned to the ED yesterday due to increased heart rate and sob.  He states that he was taking his blood pressure medication as prescribed and denies cocaine use.  Bilateral pleural effusions and lower lobe consolidation similar to that seen on recent chest x-ray. This is consistent with bibasilar pneumonia and reactive effusions. Diffuse wall thickening throughout the esophagus likely related to reflux esophagitis. Clinical correlation is  recommended. It should be noted this is stable from a prior exam of 2019.  Subjective: "My chest hurts when I am coughing sometimes." Assessment / Plan / Recommendation CHL IP CLINICAL IMPRESSIONS 05/14/2020 Clinical Impression Pt presents with mild oropharyngeal phase dysphagia characterized by lingual pumping with reduced bolus cohesiveness and premature spillage of liquids to the valleculae. Hyolaryngeal excursion appeared WNL. Pt without penetration/aspiration across consistencies/textures and only with trace amounts of thin liquid residuals along base of tongue and in lateral channels which cleared with secondary swallow. Pt is currently on 4L O2 via nasal cannula. Risk for aspiration with po intake appears minimal, however his respiratory status slightly increases his risk. Pt also with history of esophageal dysphagia. Recommend regular textures and thin liquids with standard aspiration and reflux precautions. No further SLP services indicated at this time. This study was reviewed with Dr. Elsworth Soho (pulmonary).  SLP Visit Diagnosis Dysphagia, oropharyngeal phase (R13.12) Attention and concentration deficit following -- Frontal lobe and executive function deficit following -- Impact on safety and function Mild aspiration risk   CHL IP TREATMENT RECOMMENDATION 05/14/2020 Treatment Recommendations No treatment recommended at this time   Prognosis 05/14/2020 Prognosis for Safe Diet Advancement Good Barriers to Reach Goals -- Barriers/Prognosis Comment -- [facility]L IP DIET RECOMMENDATION 05/14/2020 SLP Diet Recommendations Regular solids;Thin liquid Liquid Administration via Cup;Straw Medication Administration Whole meds with liquid Compensations Slow  rate;Small sips/bites Postural Changes Seated upright at 90 degrees;Remain semi-upright after after feeds/meals (Comment)   CHL IP OTHER RECOMMENDATIONS 05/14/2020 Recommended Consults -- Oral Care Recommendations Oral care BID Other Recommendations Clarify dietary restrictions   CHL IP FOLLOW UP RECOMMENDATIONS 05/14/2020 Follow up Recommendations None   CHL IP FREQUENCY AND DURATION 05/14/2020 Speech Therapy Frequency (ACUTE ONLY) min 2x/week Treatment Duration 1 week      CHL IP ORAL PHASE 05/14/2020 Oral Phase Impaired Oral - Pudding Teaspoon -- Oral - Pudding Cup -- Oral - Honey Teaspoon -- Oral - Honey Cup -- Oral - Nectar Teaspoon -- Oral - Nectar Cup -- Oral - Nectar Straw -- Oral - Thin Teaspoon Premature spillage Oral - Thin Cup Premature spillage;Decreased bolus cohesion Oral - Thin Straw Premature spillage Oral - Puree Lingual pumping Oral - Mech Soft -- Oral - Regular Delayed oral transit Oral - Multi-Consistency -- Oral - Pill -- Oral Phase - Comment --  CHL IP PHARYNGEAL PHASE 05/14/2020 Pharyngeal Phase Impaired Pharyngeal- Pudding Teaspoon -- Pharyngeal -- Pharyngeal- Pudding Cup -- Pharyngeal -- Pharyngeal- Honey Teaspoon -- Pharyngeal -- Pharyngeal- Honey Cup -- Pharyngeal -- Pharyngeal- Nectar Teaspoon -- Pharyngeal -- Pharyngeal- Nectar Cup -- Pharyngeal -- Pharyngeal- Nectar Straw -- Pharyngeal -- Pharyngeal- Thin Teaspoon Delayed swallow initiation-vallecula Pharyngeal -- Pharyngeal- Thin Cup Delayed swallow initiation-vallecula;Lateral channel residue Pharyngeal -- Pharyngeal- Thin Straw Lateral channel residue;Delayed swallow initiation-vallecula Pharyngeal -- Pharyngeal- Puree Delayed swallow initiation-vallecula;WFL Pharyngeal -- Pharyngeal- Mechanical Soft -- Pharyngeal -- Pharyngeal- Regular WFL Pharyngeal -- Pharyngeal- Multi-consistency -- Pharyngeal -- Pharyngeal- Pill WFL Pharyngeal -- Pharyngeal Comment --  CHL IP CERVICAL ESOPHAGEAL PHASE 05/14/2020 Cervical Esophageal Phase WFL Pudding  Teaspoon -- Pudding Cup -- Honey Teaspoon -- Honey Cup -- Nectar Teaspoon -- Nectar Cup -- Nectar Straw -- Thin Teaspoon -- Thin Cup -- Thin Straw -- Puree -- Mechanical Soft -- Regular -- Multi-consistency -- Pill -- Cervical Esophageal Comment -- Thank you, Genene Churn, [place] PORTER,DABNEY 05/14/2020, 2:21 PM              DG C-Arm 1-60 Min  Result Date: 05/25/2020 CLINICAL DATA:  Esophageal stent placement. EXAM: DG C-ARM 1-60 MIN; CHEST  1 VIEW FLUOROSCOPY TIME:  Fluoro time: 4 Minutes 17 secondsRadiation exposure index: MGy: 41.69Number of acquired spot images: Five spot fluoro graphic images. COMPARISON:  May 17, 2020.  May 23, 2020 FINDINGS: Spot fluoroscopy images were obtained for surgical planning purposes. Esophageal stent is seen traversing the thorax. Enteric contrast traverses the stent and enters the proximal stomach. None of the provided radiographs demonstrate evaluation of the site of previous leak into the RIGHT pleural space. Selective LEFT mainstem intubation. IMPRESSION: Spot fluoroscopy images from surgical planning purposes. Please reference procedure report for further details. Electronically Signed   By: Valentino Saxon MD   On: 05/25/2020 10:59   DG C-Arm 1-60 Min  Result Date: 05/17/2020 CLINICAL DATA:  Surgery. Esophageal stent placement. EXAM: DG C-ARM 1-60 MIN; CHEST  1 VIEW FLUOROSCOPY TIME:  Fluoroscopy Time:  5 minutes 48 seconds Radiation Exposure Index (if provided by the fluoroscopic device): 59.06 mGy Number of Acquired Spot Images: 3 COMPARISON:  Preprocedural CT yesterday. FINDINGS: Three fluoroscopic spot views obtained in the operating room. Endoscope is in place. A stent overlies the endoscope with tip projecting over the esophageal diaphragmatic hiatus. Subsequent enteric tube courses in the stent. There is contrast opacifying the distal stent on image 1. Right chest tube is in place. IMPRESSION: Intraoperative fluoroscopy during esophageal  stent placement. Please reference procedural report for details. Electronically Signed   By: Keith Rake M.D.   On: 05/17/2020 19:55   ECHOCARDIOGRAM COMPLETE  Result Date: 05/11/2020    ECHOCARDIOGRAM REPORT   Patient Name:   Evan Chavez Aybar Date of Exam: 05/11/2020 Medical Rec #:  [id number]           Height:       69.0 in Accession #:    [id number]          Weight:       153.0 lb Date of Birth:  [birth year]           BSA:          1.844 m Patient Age:    85 years            BP:           141/90 mmHg Patient Gender: M                   HR:           100 bpm. Exam Location:  [facility] Procedure: 2D Echo, Cardiac Doppler and Color Doppler Indications:    CHF  History:        Patient has prior history of Echocardiogram examinations, most                 recent 07/13/2018. CHF, Stroke, Signs/Symptoms:Shortness of                 Breath; Risk Factors:Hypertension and Dyslipidemia. Cocaine                 abuse, Pneumonia.  Sonographer:    Dustin Flock RDCS Referring Phys: [id number] [place]  1. Left ventricular ejection fraction, by estimation, is 65 to 70%. The left ventricle has normal function. The left ventricle has no regional wall motion abnormalities. Left ventricular diastolic parameters are consistent with Grade I diastolic dysfunction (impaired relaxation).  2. Right ventricular systolic function is normal. The right ventricular size is normal. Tricuspid regurgitation signal is inadequate for assessing PA pressure.  3. The  mitral valve is grossly normal. Trivial mitral valve regurgitation.  4. The aortic valve is tricuspid. Aortic valve regurgitation is not visualized.  5. The inferior vena cava is normal in size with greater than 50% respiratory variability, suggesting right atrial pressure of 3 mmHg. FINDINGS  Left Ventricle: Left ventricular ejection fraction, by estimation, is 65 to 70%. The left ventricle has normal function. The left ventricle has no regional wall motion  abnormalities. The left ventricular internal cavity size was normal in size. There is  no left ventricular hypertrophy. Left ventricular diastolic parameters are consistent with Grade I diastolic dysfunction (impaired relaxation). Right Ventricle: The right ventricular size is normal. No increase in right ventricular wall thickness. Right ventricular systolic function is normal. Tricuspid regurgitation signal is inadequate for assessing PA pressure. Left Atrium: Left atrial size was normal in size. Right Atrium: Right atrial size was normal in size. Pericardium: There is no evidence of pericardial effusion. Mitral Valve: The mitral valve is grossly normal. Trivial mitral valve regurgitation. Tricuspid Valve: The tricuspid valve is grossly normal. Tricuspid valve regurgitation is mild. Aortic Valve: The aortic valve is tricuspid. Aortic valve regurgitation is not visualized. Pulmonic Valve: The pulmonic valve was grossly normal. Pulmonic valve regurgitation is trivial. Aorta: The aortic root is normal in size and structure. Venous: The inferior vena cava is normal in size with greater than 50% respiratory variability, suggesting right atrial pressure of 3 mmHg. IAS/Shunts: No atrial level shunt detected by color flow Doppler.  LEFT VENTRICLE PLAX 2D LVIDd:         4.30 cm  Diastology LVIDs:         2.68 cm  LV e' lateral:   6.42 cm/s LV PW:         0.82 cm  LV E/e' lateral: 11.1 LV IVS:        0.98 cm  LV e' medial:    5.11 cm/s LVOT diam:     2.20 cm  LV E/e' medial:  13.9 LV SV:         81 LV SV Index:   44 LVOT Area:     3.80 cm  RIGHT VENTRICLE RV Basal diam:  2.99 cm RV S prime:     21.60 cm/s TAPSE (M-mode): 3.1 cm LEFT ATRIUM             Index       RIGHT ATRIUM           Index LA diam:        3.50 cm 1.90 cm/m  RA Area:     14.20 cm LA Vol (A2C):   42.8 ml 23.21 ml/m RA Volume:   31.20 ml  16.92 ml/m LA Vol (A4C):   46.7 ml 25.33 ml/m LA Biplane Vol: 45.6 ml 24.73 ml/m  AORTIC VALVE LVOT Vmax:   124.00  cm/s LVOT Vmean:  72.900 cm/s LVOT VTI:    0.212 m  AORTA Ao Root diam: 3.00 cm MITRAL VALVE MV Area (PHT): 6.65 cm     SHUNTS MV Decel Time: 114 msec     Systemic VTI:  0.21 m MV E velocity: 71.10 cm/s   Systemic Diam: 2.20 cm MV A velocity: 113.00 cm/s MV E/A ratio:  0.63 Rozann Lesches MD Electronically signed by Rozann Lesches MD Signature Date/Time: 05/11/2020/4:35:39 PM    Final    DG ESOPHAGUS W SINGLE CM (SOL OR THIN BA)  Result Date: 05/28/2020 CLINICAL DATA:  Esophageal stent with prior leak, reassessment for leak EXAM:  ESOPHOGRAM/BARIUM SWALLOW TECHNIQUE: Single contrast examination was performed using 25 cc Omnipaque 300. FLUOROSCOPY TIME:  Fluoroscopy Time:  3 minutes, 24 seconds Radiation Exposure Index (if provided by the fluoroscopic device): 33 mGy Number of Acquired Spot Images: 0 COMPARISON:  Multiple exams, including 05/23/2020 FINDINGS: Mid to distal expandable esophageal stent noted. The prior right-sided leak from the distal margin of the stent is no longer present. Contrast medium extending around the distal margin of the stent primarily on the left side appears to be intraluminal and likely in between the stent and the wall of the distal esophagus or small hiatal hernia based on the contained appearance for example on image 26/8. This collection was noted to extend proximally along the distal half of the stent along the left side. This is not unexpected given the appearance on the chest CT of 05/24/2020, which showed some luminal gas tracking along the left side of the stent in this vicinity. Accordingly, no leak from the esophagus is currently identified. IMPRESSION: 1. No esophageal leak is currently identified. 2. A small amount of intraluminal contrast extends around the stent distally, but this is felt to be due to incomplete apposition of the stent with the lumen, rather than a leak. Electronically Signed   By: Van Clines M.D.   On: 05/28/2020 08:29   DG ESOPHAGUS W  SINGLE CM (SOL OR THIN BA)  Result Date: 05/23/2020 CLINICAL DATA:  Postop esophageal perforation 05/17/2020. EXAM: ESOPHOGRAM/BARIUM SWALLOW TECHNIQUE: Single contrast examination was performed using  water-soluble. FLUOROSCOPY TIME:  Fluoroscopy Time:  1 minutes 36 seconds Radiation Exposure Index (if provided by the fluoroscopic device): Number of Acquired Spot Images: 5 COMPARISON:  CT chest 05/16/2020. FINDINGS: In the recumbent LPO position, patient drank water soluble Omnipaque 300. There is a long esophageal wall stent centered in the mid esophagus. Leakage of contrast from the right posterolateral wall of the distal esophagus, in the region of the distal aspect of the wall stent, is seen with filling of the right pleural space. IMPRESSION: Persistent leak from the right posterolateral aspect of the esophagus, along the distal aspect of the stent, with filling of the adjacent right pleural space. Electronically Signed   By: Lorin Picket M.D.   On: 05/23/2020 10:05   US THORACENTESIS ASP PLEURAL SPACE W/IMG GUIDE  Result Date: 05/14/2020 INDICATION: Complicated RIGHT pleural effusion EXAM: ULTRASOUND GUIDED DIAGNOSTIC RIGHT THORACENTESIS MEDICATIONS: None. COMPLICATIONS: Probable RIGHT pneumothorax, see chest radiograph report PROCEDURE: An ultrasound guided thoracentesis was thoroughly discussed with the patient and questions answered. The benefits, risks, alternatives and complications were also discussed. The patient understands and wishes to proceed with the procedure. Written consent was obtained. Ultrasound was performed to localize and mark an adequate pocket of fluid in the posterior RIGHT chest. The area was then prepped and draped in the normal sterile fashion. 1% Lidocaine was used for local anesthesia. Under ultrasound guidance a 5 [place] Yueh catheter was introduced. Thoracentesis was performed. The catheter was removed and a dressing applied. FINDINGS: A total of approximately 30 mL of  cloudy yellow slightly thicker RIGHT pleural fluid was removed. Samples were sent to the laboratory as requested by the clinical team. IMPRESSION: Successful ultrasound guided diagnostic RIGHT thoracentesis yielding 30 mL of cloudy yellow RIGHT pleural fluid, cannot exclude infection/empyema with this appearance. Findings discussed with Dr. Elsworth Soho on 05/14/2020 at 1545 hours. Electronically Signed   By: Lavonia Dana M.D.   On: 05/14/2020 15:50     Time Spent in minutes  30  Evan Chavez M.D on 06/02/2020 at 12:53 PM  To page go to [email or web address] - password [facility]

## 2020-06-02 NOTE — Progress Notes (Addendum)
Pharmacy Antibiotic Note  Daxton Nydam is a 59 y.o. male s/p VATs (8/5) and recently on vancomycin and zosyn. He is noted with increasing WBC and pharmacy has been consulted to start vancomycin and cefepime(continued opacity on CXR). He continues on fluconazole for fungal PNA (started 8/5) -WBC= 21.1, SCr= 0.9, tmax= 100.9 -vancomycin trough of 8/17 was 11 on 750mg  IV q12h (last dose was 8/18)  Plan: -Cefepime 2gm IV q8h -Vancomycin 1250mg  followed by 1000mg  IV q12h -Will follow renal function, cultures and clinical progress   Height: 5\' 9"  (175.3 cm) Weight: 58.5 kg (128 lb 15.5 oz) IBW/kg (Calculated) : 70.7  Temp (24hrs), Avg:99.5 F (37.5 C), Min:98.1 F (36.7 C), Max:100.9 F (38.3 C)  Recent Labs  Lab 05/29/20 0033 05/29/20 0839 05/30/20 0102 05/31/20 0047 06/01/20 0019 06/02/20 0038  WBC 22.2*  --  12.4* 14.6* 15.3* 21.2*  CREATININE 0.76  --  0.69 0.78 0.79 0.90  VANCOTROUGH  --  11*  --   --   --   --     Estimated Creatinine Clearance: 72.2 mL/min (by C-G formula based on SCr of 0.9 mg/dL).    No Known Allergies  Antimicrobials this admission: Vanc 8/12 >>8/18 Zosyn 8/3 >>8/18 Ceftriaxone 7/30>>8/2 Azithromycin 7/30>>8/2 Fluconazole 8/5>>  Dose adjustments this admission:   Microbiology results: 7/29 BCx - negative 7/30 UCx - negative 8/2 pleural fluid - negative 8/4 MRSA PCR - negative 8/5 right chest empyema tissue - candida parap 8/13 empyema: yeast - candida parap - sens to flucon -per ID 8/20 blood x2  Thank you for allowing pharmacy to be a part of this patient's care.  Hildred Laser, PharmD Clinical Pharmacist **Pharmacist phone directory can now be found on [email or web address] ([email or web address]).  Listed under [place].

## 2020-06-02 NOTE — Sepsis Progress Note (Signed)
Notified provider of need to order lactic acid and blood cultures per sepsis protocol.Marland Kitchen

## 2020-06-03 DIAGNOSIS — K9189 Other postprocedural complications and disorders of digestive system: Secondary | ICD-10-CM

## 2020-06-03 DIAGNOSIS — Z9689 Presence of other specified functional implants: Secondary | ICD-10-CM

## 2020-06-03 DIAGNOSIS — J869 Pyothorax without fistula: Secondary | ICD-10-CM

## 2020-06-03 LAB — CBC WITH DIFFERENTIAL/PLATELET
Abs Immature Granulocytes: 0.1 10*3/uL — ABNORMAL HIGH (ref 0.00–0.07)
Basophils Absolute: 0.1 10*3/uL (ref 0.0–0.1)
Basophils Relative: 0 %
Eosinophils Absolute: 0.1 10*3/uL (ref 0.0–0.5)
Eosinophils Relative: 1 %
HCT: 30.3 % — ABNORMAL LOW (ref 39.0–52.0)
Hemoglobin: 9.2 g/dL — ABNORMAL LOW (ref 13.0–17.0)
Immature Granulocytes: 1 %
Lymphocytes Relative: 8 %
Lymphs Abs: 1.2 10*3/uL (ref 0.7–4.0)
MCH: 26.4 pg (ref 26.0–34.0)
MCHC: 30.4 g/dL (ref 30.0–36.0)
MCV: 87.1 fL (ref 80.0–100.0)
Monocytes Absolute: 1.1 10*3/uL — ABNORMAL HIGH (ref 0.1–1.0)
Monocytes Relative: 8 %
Neutro Abs: 12.3 10*3/uL — ABNORMAL HIGH (ref 1.7–7.7)
Neutrophils Relative %: 82 %
Platelets: 697 10*3/uL — ABNORMAL HIGH (ref 150–400)
RBC: 3.48 MIL/uL — ABNORMAL LOW (ref 4.22–5.81)
RDW: 15.4 % (ref 11.5–15.5)
WBC: 14.9 10*3/uL — ABNORMAL HIGH (ref 4.0–10.5)
nRBC: 0 % (ref 0.0–0.2)

## 2020-06-03 LAB — COMPREHENSIVE METABOLIC PANEL
ALT: 152 U/L — ABNORMAL HIGH (ref 0–44)
AST: 72 U/L — ABNORMAL HIGH (ref 15–41)
Albumin: 2 g/dL — ABNORMAL LOW (ref 3.5–5.0)
Alkaline Phosphatase: 275 U/L — ABNORMAL HIGH (ref 38–126)
Anion gap: 13 (ref 5–15)
BUN: 19 mg/dL (ref 6–20)
CO2: 22 mmol/L (ref 22–32)
Calcium: 9 mg/dL (ref 8.9–10.3)
Chloride: 103 mmol/L (ref 98–111)
Creatinine, Ser: 0.83 mg/dL (ref 0.61–1.24)
GFR calc Af Amer: >60 mL/min (ref >60)
GFR calc non Af Amer: >60 mL/min (ref >60)
Glucose, Bld: 123 mg/dL — ABNORMAL HIGH (ref 70–99)
Potassium: 4.6 mmol/L (ref 3.5–5.1)
Sodium: 138 mmol/L (ref 135–145)
Total Bilirubin: 0.5 mg/dL (ref 0.3–1.2)
Total Protein: 7.8 g/dL (ref 6.5–8.1)

## 2020-06-03 LAB — GLUCOSE, CAPILLARY
Glucose-Capillary: 138 mg/dL — ABNORMAL HIGH (ref 70–99)
Glucose-Capillary: 145 mg/dL — ABNORMAL HIGH (ref 70–99)
Glucose-Capillary: 132 mg/dL — ABNORMAL HIGH (ref 70–99)
Glucose-Capillary: 137 mg/dL — ABNORMAL HIGH (ref 70–99)
Glucose-Capillary: 143 mg/dL — ABNORMAL HIGH (ref 70–99)

## 2020-06-03 NOTE — Plan of Care (Signed)

## 2020-06-03 NOTE — Progress Notes (Signed)
[facility]  PROGRESS NOTE  Evan Chavez VPX:[id number] DOB: October 28, 1960 DOA: 05/10/2020 PCP: Rosita Fire, MD Admit date - 05/10/2020   Admitting Physician Lajuana Matte, MD  Outpatient Primary MD for the patient is Rosita Fire, MD  LOS - 23 Brief Narrative   Evan Chavez is a 59 y.o. year old male with medical history significant for GERD, Barrett's esophagus, peptic ulcer disease with extensive gastric erosion, duodenal ulcerand esophageal stricture status post balloon dilatation on 12/2018, also with history of prior stroke, hyperlipidemia, BPH, remote cocaine abuse. who presented on 05/10/2020 to [facility] with generalized weakness, productive cough and low-grade fever shortness of breath initially admitted for pneumonia based off CTA chest showing bibasilar consolidation pleural effusions treated with ceftriaxone and azithromycin. Hospital course complicated by right-sided hydropneumothorax with possible lung collapse after right thoracentesis on 8/2, esophageal perforation (CT chest on 8/4)With large right-sided hydropneumothorax, complicated by aspiration pneumonia.  Patient underwent right-sided chest tube placement was transferred from [facility] to [facility] where he underwent VATS and decortication by CT surgery as well as esophageal stent placement and PEG tube placement on 8/4-8/5.  Patient has been managed on hospitalist service since.  Did have episodes of persistent leakage from esophagus on esophagram as well as requiring right thorascopic for drainage of loculated pleural effusion on 8/13 by CT surgery,  repeat esophagram on 8/16 did not show any esophageal leak. Currently hospitalist service is following as a consult and CT surgery is primary team  Subjective  Still spiking fevers, tachypnea with increased work of breathing.  Nursing notes patient is holding onto multiple secretions.  T-max 100.9  A & P   Sepsis presumed secondary to right base  pneumonia, improving. Chest x-ray again notes right base atelectasis/infiltrate and CT chest shows right lobe findings with known effusion/empyema on right side. Has remained afebrile since yesterday am, leukocytosis is downtrending, still has tachypnea and tachycardia.   Patient is not following pulmonary toilet and tends to pocket his oral secretions, likely at high risk for aspiration pneumonia. CT chest confirms chest tube in appropriate position relative to effusion and effusion looks smaller in size -Monitor repeat blood cultures --Continue broad spectrum antibiotics given patient is hospitalized with vancomycin and cefepime per sepsis protocol -Monitor CBC, fever/temp trend -Continue aggressive pulmonary toilet (discussed with patient and nursing) with incentive spirometry, flutter valve, out of bed to chair  Acute hypoxic respiratory failure of multifactorial etiology (bibasilar pneumonia, loculated parapneumonic effusion/complex on right, suspect ongoing left, right apical pneumothorax). Stable respiratory status on room air.  Chest tube removed on 8/20 with no pneumothorax on repeat chest x-ray -Currently on fluconazole (8/5-for Candida Parapsilosis on pleural effusion studies (05/17/20)  Sepsis secondary to aspiration pneumonia and mediastinitis from prior esophageal rupture,  resolved.  In setting of esophageal rupture.    Esophageal rupture leading to right-sided hydropneumothorax and right-sided complex loculated parapneumonic effusion.  Cultures from empyema fluid positive for candida parapsolis (8/5)Minimal output from chest tube bulb. CT chest shows last remaining chest tube in appropriate place with slight decrease in size of right empyema -Chest tube  removed per CT surgery on 8/20, follow-up post removal chest x-ray with no pneuomthorax --continue fluconazole --Continue Mucomyst, flutter valve, chest PT-  N.p.o. status in the setting of esophageal rupture -Continue tube  feeds -Tolerating clear liquid diet start on 8/19 per CT surgery   Acute on chronic blood loss anemia, stable.  In the setting of blood loss from esophageal rupture and surgery as  well as hematemesis during hospitalization.  Hemoglobin currently stable -Monitor CBC  Hypophosphatemia, resolved. Improved with IV supplementation  Impaired mobility/generalized weakness -PT recommends SNF -Social worker consulted -Monitor CBC, transfuse if hemoglobin less than 7  Intermittent delirium, resolved.  Likely hospital-acquired given prolonged course.  Currently alert and oriented to place, self, time, context no longer restless or agitated. -Delirium precautions -As needed IV Haldol has been helpful   Paroxysmal atrial fibrillation.  No longer A. fib.   --Cardiology advised discontinuing diltiazem, Continuing metoprolol 50 mg twice daily as well as DOAC when he is able to tolerate  Sinus tachycardia, mild with heart rate in the low 100s not A. fib on my review of telemetry, suspect related to dehydration due to water deficits and infection driven.  TSH within normal limits, hemoglobin stable.  Likely persistent related to ongoing infection addressed above -Monitor hydration --continue antibiotics --Clear liquid diet, expect improvement with increase oral hydration -Continue to monitor on telemetry  Prior CVA prior to admission -Holding home aspirin 325 - will likely resume Lipitor on discharge, continue to monitor LFTs  Elevated LFTs, continue to downtrend Denies any abdominal pain.  Hepatitis panel unremarkable -Continue to hold home statins  Left hydronephrosis.  Noted on CT scan.  As well as 9 mm obstructing proximal left ureteral stone  urology was consulted during hospitalization and recommended continue original plan of shockwave lithotripsy. this was deferred during current hospitalization due to respiratory status -Urology will arrange close follow-up to get this scheduled,         Family Communication  : Mother updated at bedside on 8/21  Code Status :  FULL  Disposition Plan  :  Patient is from home. Anticipated d/c date: 2 to 3 days. Barriers to d/c or necessity for inpatient status:  contine  broad-spectrum antibiotics for sepsis secondary to right lower lobe pneumonia, still needing final disposition for anticoagulation--cardiology recommending DOAC at discharge if able to tolerate,    Procedures  :    DVT Prophylaxis  :  SCDs   Lab Results  Component Value Date   PLT 697 (H) 06/03/2020    Diet :  Diet Order            Diet clear liquid Room service appropriate? Yes; Fluid consistency: Thin  Diet effective now                  Inpatient Medications Scheduled Meds: . bisacodyl  10 mg Oral Daily  . feeding supplement  1 Container Oral BID BM  . feeding supplement (PROSource TF)  45 mL Per Tube BID  . insulin aspart  0-24 Units Subcutaneous TID AC & HS  . mouth rinse  15 mL Mouth Rinse q12n4p  . metoprolol tartrate  50 mg Per Tube BID  . pantoprazole (PROTONIX) IV  40 mg Intravenous Q12H  . sennosides  5 mL Per Tube QHS   Continuous Infusions: . sodium chloride 70 mL/hr at 05/16/20 2122  . sodium chloride    . sodium chloride    . ceFEPime (MAXIPIME) IV 2 g (06/03/20 0510)  . feeding supplement (OSMOLITE 1.5 CAL) 1,000 mL (06/02/20 0602)  . fluconazole (DIFLUCAN) IV 400 mg (06/03/20 6659)  . vancomycin 1,000 mg (06/03/20 0316)   PRN Meds:.sodium chloride, Place/Maintain arterial line **AND** sodium chloride, Place/Maintain arterial line **AND** sodium chloride, [DISCONTINUED] acetaminophen **OR** acetaminophen, fentaNYL (SUBLIMAZE) injection, hydrALAZINE, ipratropium-albuterol, metoprolol tartrate, ondansetron (ZOFRAN) IV, traMADol  Antibiotics  :   Anti-infectives (From admission, onward)  Start     Dose/Rate Route Frequency Ordered Stop   06/03/20 0200  vancomycin (VANCOCIN) IVPB 1000 mg/200 mL premix        1,000 mg 200  mL/hr over 60 Minutes Intravenous Every 12 hours 06/02/20 1248     06/02/20 2200  ceFEPIme (MAXIPIME) 2 g in sodium chloride 0.9 % 100 mL IVPB        2 g 200 mL/hr over 30 Minutes Intravenous Every 8 hours 06/02/20 1251     06/02/20 2000  piperacillin-tazobactam (ZOSYN) IVPB 3.375 g  Status:  Discontinued        3.375 g 12.5 mL/hr over 240 Minutes Intravenous Every 8 hours 06/02/20 1248 06/02/20 1249   06/02/20 1330  vancomycin (VANCOREADY) IVPB 1250 mg/250 mL        1,250 mg 166.7 mL/hr over 90 Minutes Intravenous  Once 06/02/20 1248 06/02/20 1646   06/02/20 1330  piperacillin-tazobactam (ZOSYN) IVPB 3.375 g  Status:  Discontinued        3.375 g 100 mL/hr over 30 Minutes Intravenous  Once 06/02/20 1248 06/02/20 1249   06/02/20 1215  vancomycin (VANCOCIN) IVPB 1000 mg/200 mL premix  Status:  Discontinued        1,000 mg 200 mL/hr over 60 Minutes Intravenous  Once 06/02/20 1202 06/02/20 1251   06/02/20 1215  ceFEPIme (MAXIPIME) 2 g in sodium chloride 0.9 % 100 mL IVPB        2 g 200 mL/hr over 30 Minutes Intravenous  Once 06/02/20 1202 06/02/20 1506   05/29/20 1800  vancomycin (VANCOCIN) IVPB 1000 mg/200 mL premix  Status:  Discontinued        1,000 mg 200 mL/hr over 60 Minutes Intravenous Every 12 hours 05/29/20 1151 05/30/20 0912   05/24/20 2100  vancomycin (VANCOREADY) IVPB 750 mg/150 mL  Status:  Discontinued        750 mg 150 mL/hr over 60 Minutes Intravenous Every 12 hours 05/24/20 0813 05/29/20 1151   05/24/20 0900  vancomycin (VANCOREADY) IVPB 1500 mg/300 mL        1,500 mg 150 mL/hr over 120 Minutes Intravenous  Once 05/24/20 0813 05/24/20 1221   05/17/20 0900  fluconazole (DIFLUCAN) IVPB 400 mg        400 mg 100 mL/hr over 120 Minutes Intravenous Every 24 hours 05/17/20 0749     05/17/20 0600  vancomycin (VANCOREADY) IVPB 750 mg/150 mL  Status:  Discontinued        750 mg 150 mL/hr over 60 Minutes Intravenous Every 8 hours 05/16/20 2154 05/17/20 0903   05/17/20 0400   vancomycin (VANCOREADY) IVPB 750 mg/150 mL  Status:  Discontinued        750 mg 150 mL/hr over 60 Minutes Intravenous Every 8 hours 05/16/20 1945 05/16/20 2154   05/16/20 2200  vancomycin (VANCOREADY) IVPB 1250 mg/250 mL        1,250 mg 166.7 mL/hr over 90 Minutes Intravenous STAT 05/16/20 2153 05/16/20 2348   05/16/20 2000  vancomycin (VANCOREADY) IVPB 1250 mg/250 mL  Status:  Discontinued        1,250 mg 166.7 mL/hr over 90 Minutes Intravenous  Once 05/16/20 1945 05/16/20 2153   05/15/20 1600  piperacillin-tazobactam (ZOSYN) IVPB 3.375 g  Status:  Discontinued        3.375 g 12.5 mL/hr over 240 Minutes Intravenous Every 8 hours 05/15/20 0945 05/30/20 0912   05/15/20 0930  piperacillin-tazobactam (ZOSYN) IVPB 3.375 g        3.375 g 100 mL/hr  over 30 Minutes Intravenous  Once 05/15/20 0921 05/15/20 1009   05/13/20 2000  azithromycin (ZITHROMAX) tablet 500 mg        500 mg Oral Daily 05/13/20 1155 05/14/20 2127   05/10/20 2200  cefTRIAXone (ROCEPHIN) 2 g in sodium chloride 0.9 % 100 mL IVPB  Status:  Discontinued        2 g 200 mL/hr over 30 Minutes Intravenous Every 24 hours 05/10/20 2148 05/15/20 0911   05/10/20 2200  azithromycin (ZITHROMAX) 500 mg in sodium chloride 0.9 % 250 mL IVPB  Status:  Discontinued        500 mg 250 mL/hr over 60 Minutes Intravenous Every 24 hours 05/10/20 2148 05/13/20 1155       Objective   Vitals:   06/03/20 0300 06/03/20 0412 06/03/20 0700 06/03/20 1100  BP: 133/83     Pulse: (!) 125  (!) 119 (!) 112  Resp: (!) 31 (!) 25    Temp: 98.9 F (37.2 C)  98.7 F (37.1 C) 98.9 F (37.2 C)  TempSrc: Oral  Oral Oral  SpO2: 98%  97% 98%  Weight: 57.8 kg     Height:        SpO2: 98 % O2 Flow Rate (L/min): 4 L/min FiO2 (%): 21 %  Wt Readings from Last 3 Encounters:  06/03/20 57.8 kg  05/09/20 69.4 kg  12/13/19 68.2 kg     Intake/Output Summary (Last 24 hours) at 06/03/2020 1211 Last data filed at 06/03/2020 0300 Gross per 24 hour  Intake 220  ml  Output 921.3 ml  Net -701.3 ml    Physical Exam:     Awake Alert, Oriented X 3, flat affect On room air, increased work of breathing but no acute respiratory distress, diminished breath sounds on right side, no wheezing or rhonchi, chest tube bulb with minimal drainage Abdomen soft, nondistended, PEG tube in place Tachycardic, normal rhythm,No Gallops,Rubs or new Murmurs,     I have personally reviewed the following:   Data Reviewed:  CBC Recent Labs  Lab 05/30/20 0102 05/31/20 0047 06/01/20 0019 06/02/20 0038 06/03/20 0121  WBC 12.4* 14.6* 15.3* 21.2* 14.9*  HGB 9.0* 9.4* 9.4* 9.4* 9.2*  HCT 29.2* 30.8* 30.8* 30.3* 30.3*  PLT 719* 704* 739* 688* 697*  MCV 88.0 86.3 85.6 86.6 87.1  MCH 27.1 26.3 26.1 26.9 26.4  MCHC 30.8 30.5 30.5 31.0 30.4  RDW 14.9 14.8 14.8 15.2 15.4  LYMPHSABS 0.9 0.9 0.9 1.1 1.2  MONOABS 0.7 0.8 0.8 1.2* 1.1*  EOSABS 0.4 0.1 0.0 0.1 0.1  BASOSABS 0.1 0.1 0.1 0.1 0.1    Chemistries  Recent Labs  Lab 05/28/20 0107 05/29/20 0033 05/30/20 0102 05/31/20 0047 06/01/20 0019 06/02/20 0038 06/03/20 0121  NA 136   < > 134* 133* 131* 134* 138  K 4.3   < > 4.3 5.0 4.4 4.5 4.6  CL 105   < > 102 100 100 103 103  CO2 21*   < > 24 24 22 24 22   GLUCOSE 141*   < > 127* 140* 158* 151* 123*  BUN 10   < > 10 11 14 18 19   CREATININE 0.82   < > 0.69 0.78 0.79 0.90 0.83  CALCIUM 8.0*   < > 8.3* 8.8* 8.6* 8.5* 9.0  MG 2.1  --   --   --   --   --   --   AST 65*   < > 61* 67* 57* 37 72*  ALT  247*   < > 172* 198* 167* 130* 152*  ALKPHOS 252*   < > 233* 250* 266* 262* 275*  BILITOT 0.3   < > 0.4 0.3 0.5 0.4 0.5   < > = values in this interval not displayed.   ------------------------------------------------------------------------------------------------------------------ No results for input(s): CHOL, HDL, LDLCALC, TRIG, CHOLHDL, LDLDIRECT in the last 72 hours.  Lab Results  Component Value Date   HGBA1C 5.5 07/12/2018    ------------------------------------------------------------------------------------------------------------------ No results for input(s): TSH, T4TOTAL, T3FREE, THYROIDAB in the last 72 hours.  Invalid input(s): FREET3 ------------------------------------------------------------------------------------------------------------------ No results for input(s): VITAMINB12, FOLATE, FERRITIN, TIBC, IRON, RETICCTPCT in the last 72 hours.  Coagulation profile Recent Labs  Lab 06/02/20 1240 06/02/20 1524  INR 1.2 1.2    No results for input(s): DDIMER in the last 72 hours.  Cardiac Enzymes No results for input(s): CKMB, TROPONINI, MYOGLOBIN in the last 168 hours.  Invalid input(s): CK ------------------------------------------------------------------------------------------------------------------    Component Value Date/Time   BNP 68.0 05/14/2020 0604    Micro Results Recent Results (from the past 240 hour(s))  Aerobic/Anaerobic Culture (surgical/deep wound)     Status: None   Collection Time: 05/25/20  9:03 AM   Specimen: PATH Other; Body Fluid  Result Value Ref Range Status   Specimen Description FLUID  Final   Special Requests EMPYEMA SPEC A  Final   Gram Stain   Final    ABUNDANT WBC PRESENT, PREDOMINANTLY PMN NO ORGANISMS SEEN    Culture   Final    RARE CANDIDA PARAPSILOSIS NO ANAEROBES ISOLATED Performed at [facility], [place] [address] [place], [place] [address]    Report Status 05/30/2020 FINAL  Final  Culture, blood (routine x 2)     Status: None (Preliminary result)   Collection Time: 06/01/20 10:28 AM   Specimen: BLOOD LEFT ARM  Result Value Ref Range Status   Specimen Description BLOOD LEFT ARM  Final   Special Requests BOTTLES DRAWN AEROBIC ONLY  Final   Culture   Final    NO GROWTH 2 DAYS Performed at [facility], [place] [address], [place], [place] [address]    Report Status PENDING  Incomplete  Culture, blood (routine x 2)     Status: None  (Preliminary result)   Collection Time: 06/01/20 10:30 AM   Specimen: BLOOD LEFT HAND  Result Value Ref Range Status   Specimen Description BLOOD LEFT HAND  Final   Special Requests   Final    BOTTLES DRAWN AEROBIC ONLY Blood Culture adequate volume   Culture   Final    NO GROWTH 2 DAYS Performed at [facility], [place] [address] [place], [place] [address]    Report Status PENDING  Incomplete  Culture, blood (x 2)     Status: None (Preliminary result)   Collection Time: 06/02/20  3:18 PM   Specimen: BLOOD LEFT HAND  Result Value Ref Range Status   Specimen Description BLOOD LEFT HAND  Final   Special Requests   Final    BOTTLES DRAWN AEROBIC AND ANAEROBIC Blood Culture adequate volume   Culture   Final    NO GROWTH < 24 HOURS Performed at [facility], [place] [address] [place], [place] [address]    Report Status PENDING  Incomplete  Culture, blood (x 2)     Status: None (Preliminary result)   Collection Time: 06/02/20  3:24 PM   Specimen: BLOOD RIGHT HAND  Result Value Ref Range Status  Specimen Description BLOOD RIGHT HAND  Final   Special Requests   Final    BOTTLES DRAWN AEROBIC AND ANAEROBIC Blood Culture adequate volume   Culture   Final    NO GROWTH < 24 HOURS Performed at [facility], [place] [address] [place], [place] [address]    Report Status PENDING  Incomplete    Radiology Reports DG Chest 1 View  Result Date: 05/26/2020 CLINICAL DATA:  Esophageal anastomotic leak EXAM: CHEST  1 VIEW COMPARISON:  05/25/2020 FINDINGS: Right chest tubes and esophageal stent are again noted. There is new near total opacification of the right hemithorax. Right pleural effusion is present. Left lung remains clear. No pneumothorax. Cardiomediastinal contours are partially obscured. There is mild rightward mediastinal shift. IMPRESSION: New near total opacification of the right hemithorax likely reflecting combination of effusion and atelectasis. Electronically Signed    By: Macy Mis M.D.   On: 05/26/2020 08:21   DG Chest 1 View  Result Date: 05/25/2020 CLINICAL DATA:  Esophageal stent placement. EXAM: DG C-ARM 1-60 MIN; CHEST  1 VIEW FLUOROSCOPY TIME:  Fluoro time: 4 Minutes 17 secondsRadiation exposure index: MGy: 41.69Number of acquired spot images: Five spot fluoro graphic images. COMPARISON:  May 17, 2020.  May 23, 2020 FINDINGS: Spot fluoroscopy images were obtained for surgical planning purposes. Esophageal stent is seen traversing the thorax. Enteric contrast traverses the stent and enters the proximal stomach. None of the provided radiographs demonstrate evaluation of the site of previous leak into the RIGHT pleural space. Selective LEFT mainstem intubation. IMPRESSION: Spot fluoroscopy images from surgical planning purposes. Please reference procedure report for further details. Electronically Signed   By: Valentino Saxon MD   On: 05/25/2020 10:59   DG Chest 1 View  Result Date: 05/17/2020 CLINICAL DATA:  Surgery. Esophageal stent placement. EXAM: DG C-ARM 1-60 MIN; CHEST  1 VIEW FLUOROSCOPY TIME:  Fluoroscopy Time:  5 minutes 48 seconds Radiation Exposure Index (if provided by the fluoroscopic device): 59.06 mGy Number of Acquired Spot Images: 3 COMPARISON:  Preprocedural CT yesterday. FINDINGS: Three fluoroscopic spot views obtained in the operating room. Endoscope is in place. A stent overlies the endoscope with tip projecting over the esophageal diaphragmatic hiatus. Subsequent enteric tube courses in the stent. There is contrast opacifying the distal stent on image 1. Right chest tube is in place. IMPRESSION: Intraoperative fluoroscopy during esophageal stent placement. Please reference procedural report for details. Electronically Signed   By: Keith Rake M.D.   On: 05/17/2020 19:55   DG Chest 1 View  Result Date: 05/14/2020 CLINICAL DATA:  Complicated RIGHT pleural effusion post diagnostic thoracentesis EXAM: CHEST  1 VIEW COMPARISON:   05/10/2020 FINDINGS: Enlargement of cardiac silhouette. Mediastinal contours and pulmonary vascularity normal. Persistent RIGHT pleural effusion and significant atelectasis and/or consolidation of the mid to lower RIGHT lung. New lucency at the RIGHT apex, suspect representing apical pneumothorax. No mediastinal shift. Patient asymptomatic. Persistent infiltrate LEFT lower lobe and tiny LEFT pleural effusion. IMPRESSION: Probable RIGHT apical pneumothorax post diagnostic thoracentesis. Persistent complicated partially loculated RIGHT pleural effusion, RIGHT basilar atelectasis versus consolidation, and LEFT lower lobe consolidation. Patient asymptomatic. Findings discussed with Dr. Elsworth Soho on 05/14/2020 at 1545 hours. Electronically Signed   By: Lavonia Dana M.D.   On: 05/14/2020 15:51   CT CHEST WO CONTRAST  Result Date: 05/16/2020 CLINICAL DATA:  Pleural effusion. EXAM: CT CHEST WITHOUT CONTRAST TECHNIQUE: Multidetector CT imaging of the chest was performed following the standard protocol without IV contrast. COMPARISON:  CT dated May 10, 2020 FINDINGS: Cardiovascular: The heart size is stable. There is some shift of the mediastinum to the left. There is a trace pericardial effusion. Mild atherosclerotic changes are noted of the thoracic aorta. Mediastinum/Nodes: --mild mediastinal adenopathy is noted -- there is mild bilateral hilar adenopathy. -- No axillary lymphadenopathy. -- No supraclavicular lymphadenopathy. --there is a left-sided thyroid nodule measuring approximately 2.4 cm. -the esophagus is diffusely thickened. There appears to be a defect in the right lateral aspect of the mid to distal esophagus (axial series 3, image [id number]). Adjacent to this defect is extraluminal oral contrast tracking into the right pleural space. Lungs/Pleura: There is been interval placement of a right-sided chest tube. There is a large right-sided hydropneumothorax which has increased in size since the patient's prior study  despite placement of a right-sided chest tube. Additionally. The pleural effusion component appears more complex than prior study. There is new extensive debris within bronchus intermedius as well as the right lower lobe and right middle lobe bronchi consistent with significant interval aspiration. The right middle lobe and right lower lobe are essentially collapsed. There are hypoattenuating components within the right middle and right lower lobes concerning for developing aspiration pneumonia. There are new areas of consolidation involving the left lower and left upper lobes concerning for aspiration. There is a small to moderate-sized left-sided pleural effusion. The chest tube is kinked at its distal most aspect. Upper Abdomen: There is no definite acute abnormality in the upper abdomen. Musculoskeletal: No chest wall abnormality. No bony spinal canal stenosis. IMPRESSION: 1. Overall findings consistent with esophageal perforation arising from the right lateral aspect of the mid to distal esophagus as detailed above. There is extraluminal oral contrast tracking from this esophageal wall defect into the right pleural space. 2. Large right-sided hydropneumothorax with significant interval worsening since the prior study despite right-sided chest tube placement. Additionally, the fluid component appears more complex. The right-sided chest tube appears to enter the thoracic cavity inferiorly and courses along the right hemidiaphragm and terminates in the region of the right epicardial fat pad. The tube is kinked at its tip. While the tube is likely intrathoracic, it will need significant repositioning to be effective. 3. Findings consistent with interval aspiration with complete occlusion of bronchus intermedius as well as the right middle and right lower lobe bronchi. There is likely developing aspiration pneumonia within the collapsed right middle and right lower lobes as well as the posterior left upper and left  lower lobes. There is a small left-sided pleural effusion. 4. Persistent diffuse esophageal wall thickening of unknown clinical significance. Correlation with endoscopy is recommended as an underlying mass is not excluded. 5. Again noted is a left-sided thyroid nodule measuring approximately 2.4 cm. Outpatient thyroid ultrasound follow-up is recommended.(Ref: J Am Coll Radiol. 2015 Feb;12(2): 143-50). These results were called by telephone at the time of interpretation on 05/16/2020 at 6:04 pm to provider [facility] ALVA , who verbally acknowledged these results. Electronically Signed   By: Constance Holster M.D.   On: 05/16/2020 18:04   CT CHEST W CONTRAST  Result Date: 06/02/2020 CLINICAL DATA:  59 year old male with recent esophageal perforation with esophageal stent placement and VATS for RIGHT empyema. EXAM: CT CHEST WITH CONTRAST TECHNIQUE: Multidetector CT imaging of the chest was performed during intravenous contrast administration. CONTRAST:  68mL OMNIPAQUE IOHEXOL 300 MG/ML  SOLN COMPARISON:  05/24/2020 CT FINDINGS: Cardiovascular: UPPER limits normal heart size again noted. Heavy coronary artery calcifications are again identified. There is no  evidence of thoracic aortic aneurysm or pericardial effusion. Mediastinum/Nodes: Esophageal stent is again noted. A small amount of paraesophageal gas is again noted and connects to the posteromedial loculated RIGHT pleural collection which measures 2.8 x 7.7 cm in greatest diameter, previously 4.2 x 7.7 cm. No new findings are noted. Mildly prominent mediastinal and RIGHT hilar lymph nodes are likely reactive. Lungs/Pleura: A RIGHT thoracostomy tube courses through the 2.8 x 7.7 cm posteromedial loculated RIGHT pleural collection with tip located 8 cm SUPERIOR to the collection. A small RIGHT pleural effusion is noted. Moderate RIGHT LOWER lung atelectasis is present. There is no evidence of pneumothorax. Upper Abdomen: Probable moderate LEFT hydronephrosis again  noted. Musculoskeletal: No acute or suspicious bony abnormalities are present. IMPRESSION: 1. Slightly decreased size of posteromedial RIGHT pleural collection/empyema from 05/24/2020. The RIGHT thoracostomy tube courses through this collection with tip located 8 cm superiorly. 2. Small RIGHT pleural effusion and moderate RIGHT LOWER lung atelectasis. 3. Esophageal stent again noted. 4. Probable moderate LEFT hydronephrosis. Consider further evaluation with ultrasound or CT as clinically indicated. 5. Coronary artery disease andaortic Atherosclerosis (ICD10-I70.0). Electronically Signed   By: Margarette Canada M.D.   On: 06/02/2020 14:41   CT CHEST W CONTRAST  Result Date: 05/24/2020 CLINICAL DATA:  Esophageal perforation, progressive leukocytosis, abnormal chest x-ray, dyspnea EXAM: CT CHEST WITH CONTRAST TECHNIQUE: Multidetector CT imaging of the chest was performed during intravenous contrast administration. CONTRAST:  77mL OMNIPAQUE IOHEXOL 350 MG/ML SOLN COMPARISON:  Chest radiograph 05/22/2020, CT 05/10/2020 FINDINGS: Cardiovascular: Extensive coronary artery calcification. Global cardiac size within normal limits. Trace pericardial fluid is likely physiologic in is stable since prior examination. Thoracic aorta is age-appropriate. Mediastinum/Nodes: And endoluminal stent graft has been placed within the esophagus extending from the level of the sternal notch within the proximal esophagus through the mid to distal esophagus at the level of the left atrium. The stent graft is not completely appose the esophageal lumen proximally, best seen on axial image # 27/3. Additionally, there is a defect involving the right posterolateral aspect of the esophagus, best seen on axial image # 77/3, with an adjacent loculated collection of gas, fluid, and extravasated contrast measuring 4.2 x 7.6 x 8.4 cm on axial image # 82 and sagittal image # 77. The distal margin of the stent graft does appear to completely apposed the  esophageal lumen, though this is extremely close to the point of perforation. The distal esophagus again demonstrates marked circumferential wall thickening in keeping with esophagitis. Numerous shotty enhancing right paratracheal lymph nodes are identified, likely reactive. No pathologic thoracic adenopathy. Largely cystic nodule again identified within the left thyroid lobe, unchanged. Lungs/Pleura: Right chest tube is in place extending posteriorly and apically. There is collapse and consolidation of the right middle and lower lobes. There is partially loculated gas and fluid within the right pleural space laterally and within the major inter lobar fissure. The pleural fluid component is relatively small and communicates with the chest tube and has significantly decreased in volumes since prior examination, both in its free-flowing component as well as the a loculated component within the inter lobar fissure. The previously noted extravasated collection, however, does appear isolated from the chest tube. Bronchial wall thickening within the aerated right upper lobe is in keeping with airway inflammation. The central airways of the right lower lobe appear impacted and fluid filled. Minimal infiltrate is seen within the basilar lingula and left lower lobe, infectious or inflammatory in nature. No central obstructing mass. No pneumothorax or  pleural effusion on the left. Upper Abdomen: Left hydronephrosis is partially visualized, incompletely assessed on this examination. This appears similar to that noted on prior examination of 05/13/2020. Musculoskeletal: No acute bone abnormality. IMPRESSION: Interval placement of right chest tube with significant evacuation loculated right pleural fluid. A small free-flowing fluid in gas component remains as well as a small partially loculated component within the inter lobar fissure. Interval placement of an and luminal stent graft within the esophagus with incomplete  apposition of the graft proximally likely resulting in contrast coursing between the stent graft and the esophageal lumen to the point of leakage noted on prior esophagram. Pilar Plate perforation of the right posterolateral mid to distal esophagus again noted. Associated loculated extravasated contrast and fluid collection within the right pleural space appears isolated from the adjacent right chest tube. Collapse and consolidation of the right middle and right lower lobe with extensive fluid opacification of the right lower lobe airways. Airway inflammation involving the right upper lobe and probable minimal infectious or inflammatory infiltrate within the left lung base. Left hydronephrosis, similar to that noted on prior examination, incompletely evaluated on this examination. Electronically Signed   By: Fidela Salisbury MD   On: 05/24/2020 15:21   CT Angio Chest PE W and/or Wo Contrast  Result Date: 05/11/2020 CLINICAL DATA:  Shortness of breath and weakness for several days EXAM: CT ANGIOGRAPHY CHEST WITH CONTRAST TECHNIQUE: Multidetector CT imaging of the chest was performed using the standard protocol during bolus administration of intravenous contrast. Multiplanar CT image reconstructions and MIPs were obtained to evaluate the vascular anatomy. CONTRAST:  74mL OMNIPAQUE IOHEXOL 350 MG/ML SOLN COMPARISON:  Chest x-ray from earlier in the same day, CTA from 07/12/2018. FINDINGS: Cardiovascular: Thoracic aorta demonstrates atherosclerotic calcifications. No aneurysmal dilatation or dissection is noted. No cardiac enlargement is seen. Coronary calcifications are noted. The pulmonary artery shows a normal branching pattern. No definitive filling defect is identified to suggest pulmonary embolism. Mediastinum/Nodes: Thoracic inlet shows a hypodensity within the left lobe of the thyroid measuring 17 mm in greatest dimension. This is relatively stable from a prior CT examination from 2019. No sizable hilar or  mediastinal adenopathy is noted. The esophagus demonstrates some diffuse wall thickening which may be related to reflux esophagitis. Lungs/Pleura: Bilateral moderate pleural effusions are seen. Associated lower lobe consolidation is noted. No sizable parenchymal nodules are seen. No pneumothorax is noted. Upper Abdomen: Visualized upper abdomen shows geographic decreased attenuation within the left kidney and enlargement of the left kidney. This may simply be related to hydronephrosis. The possibility of an infiltrating mass deserves consideration. This can be further evaluated with CT of the abdomen and pelvis with contrast. Musculoskeletal: No acute bony abnormality is noted. Review of the MIP images confirms the above findings. IMPRESSION: No evidence of pulmonary emboli. Bilateral pleural effusions and lower lobe consolidation similar to that seen on recent chest x-ray. This is consistent with bibasilar pneumonia and reactive effusions. Diffuse wall thickening throughout the esophagus likely related to reflux esophagitis. Clinical correlation is recommended. It should be noted this is stable from a prior exam of 2019. Enlargement of the left kidney with decreased attenuation identified. Although this may simply represent hydronephrosis possibility of an underlying infiltrative mass deserves consideration. CT of the abdomen and pelvis with contrast is recommended. 17 mm left thyroid nodule. This is roughly stable from the prior CT examination from 2019 and likely benign. Recommend thyroid US (ref: J Am Coll Radiol. 2015 Feb;12(2): 143-50). Electronically Signed   By:  Inez Catalina M.D.   On: 05/11/2020 00:00   CT ABDOMEN PELVIS W CONTRAST  Result Date: 05/13/2020 CLINICAL DATA:  LEFT renal mass. Suspicion for renal mass on prior chest CT. EXAM: CT ABDOMEN AND PELVIS WITH CONTRAST TECHNIQUE: Multidetector CT imaging of the abdomen and pelvis was performed using the standard protocol following bolus  administration of intravenous contrast. CONTRAST:  148mL OMNIPAQUE IOHEXOL 300 MG/ML  SOLN COMPARISON:  Chest CT of May 10, 2020 FINDINGS: Lower chest: Loculated bilateral basilar effusions with similar appearance when compared to the study of May 10, 2020. Basilar airspace disease with increasing airspace disease particularly at the LEFT lung base. Hiatal hernia and patulous distal esophagus. Calcified coronary artery disease partially imaged. No pericardial effusion. Hepatobiliary: Liver without focal, suspicious lesion. Portal vein is patent. No pericholecystic stranding. No biliary duct dilation. Pancreas: Pancreas is normal without peripancreatic stranding, ductal dilation or inflammation. Spleen: Spleen normal in size and contour. Adrenals/Urinary Tract: Adrenal glands are normal. Moderate to marked LEFT-sided hydroureteronephrosis with overlying cortical thinning. Large calculus in the proximal LEFT ureter measuring 9 x 8 x 8 mm. No perinephric abscess. Urinary bladder is normal. Normal enhancement of the RIGHT kidney. Enhancement of the kidneys despite scarring and dilation is relatively symmetric and there is an additional small calculus to moderate sized calculus in the lower pole the LEFT kidney. Area with limited assessment due to motion artifact. Stomach/Bowel: Hiatal hernia and some distal esophageal thickening as described. Small duodenal diverticulum in the second portion of the duodenum. No acute gastrointestinal process. Appendix not visualized. Motion artifact in this area limiting assessment but no gross findings of inflammation that would indicate pericecal stranding or secondary signs of acute appendicitis. Colonic interposition along the anterior portion of the liver. No pericolonic stranding. No perirectal stranding. Vascular/Lymphatic: Calcific and noncalcific atheromatous plaque of the abdominal aorta. No aneurysmal dilation. No adenopathy in the retroperitoneum or upper abdomen. No  pelvic lymphadenopathy. Reproductive: Heterogeneous prostate. Calcifications of the vas deferens. Other: No ascites. Musculoskeletal: No acute or destructive bone finding. Spinal degenerative changes. IMPRESSION: 1. Moderate to marked hydronephrosis due to calculus in the proximal to mid LEFT ureter. Urologic consultation and correlation with urine studies to exclude concomitant infection is suggested. 2. Cortical thinning that is seen on the LEFT and preservation of renal enhancement suggests that an element of this process could potentially be chronic. 3. Loculated effusions and worsening of LEFT lower lobe airspace disease that may represent a combination of volume loss and pneumonitis 4. Loculated bilateral basilar effusions with similar appearance when compared to the study of May 10, 2020. Basilar airspace disease with increasing airspace disease particularly at the LEFT lung base. Findings may represent worsening pneumonia or aspiration. 5. Hiatal hernia and patulous distal esophagus. 6. Aortic atherosclerosis. Aortic Atherosclerosis (ICD10-I70.0). Electronically Signed   By: Zetta Bills M.D.   On: 05/13/2020 12:52   DG CHEST PORT 1 VIEW  Result Date: 06/02/2020 CLINICAL DATA:  Tachypnea EXAM: PORTABLE CHEST 1 VIEW COMPARISON:  June 01, 2020 FINDINGS: An esophageal stent is again identified. Stable right chest tube. No pneumothorax. Opacity and effusion remain in the right base. No other interval changes. IMPRESSION: 1. Small right effusion underlying opacity. The opacity could represent atelectasis or infiltrate. No other changes. Electronically Signed   By: Dorise Bullion III M.D   On: 06/02/2020 12:41   DG CHEST PORT 1 VIEW  Result Date: 06/01/2020 CLINICAL DATA:  Chest tube. Shortness of breath. EXAM: PORTABLE CHEST 1 VIEW COMPARISON:  May 29, 2020 FINDINGS: Since the prior study the right apical chest tube seen on the prior study has been removed. The additional right-sided chest tube  seen on the prior exam is stable in position. Persistent moderate to marked severity right basilar atelectasis and/or infiltrate is noted. There is a small right pleural effusion. A small amount of right apical pleural fluid is also suspected. No residual pneumothorax is identified. The cardiac silhouette is mildly enlarged. A radiopaque stent is seen overlying the mid and distal esophagus. The visualized skeletal structures are unremarkable. IMPRESSION: 1. Interval right apical chest tube removal without evidence of a residual pneumothorax. 2. Persistent moderate to marked severity right basilar atelectasis and/or infiltrate. 3. Small right pleural effusion. Electronically Signed   By: Virgina Norfolk M.D.   On: 06/01/2020 16:11   DG Chest Port 1 View  Result Date: 05/29/2020 CLINICAL DATA:  Chest tube.  Shortness of breath. EXAM: PORTABLE CHEST 1 VIEW COMPARISON:  05/28/2020. FINDINGS: Two right chest tubes in stable position. Esophageal stent in stable position. Stable cardiomegaly. Progressive dense right base atelectasis/infiltrate. Persistent right mid lung field infiltrate. Small right pleural effusion, improved from prior exam. No pneumothorax. IMPRESSION: 1. Two right chest tubes in stable position. Esophageal stent stable position. No pneumothorax. 2.  Stable cardiomegaly. 3. Progressive dense right base atelectasis/infiltrate. Persistent right mid lung field infiltrate. 3.  Small right pleural effusion, improved from prior exam. Electronically Signed   By: Marcello Moores  Register   On: 05/29/2020 07:38   DG Chest Port 1 View  Result Date: 05/28/2020 CLINICAL DATA:  Pleural effusion, chest tube EXAM: PORTABLE CHEST 1 VIEW COMPARISON:  05/27/2020 FINDINGS: There are two right-sided chest tubes. Esophageal stent is again noted. Improved aeration of the right lung with persistent but decreased right pleural effusion. No pneumothorax. Cardiomediastinal contours are partially obscured IMPRESSION: Improved  aeration of right lung since 05/27/2020 with persistent but decreased right pleural effusion. Electronically Signed   By: Macy Mis M.D.   On: 05/28/2020 09:53   DG Chest Port 1 View  Result Date: 05/27/2020 CLINICAL DATA:  Shortness of breath EXAM: PORTABLE CHEST 1 VIEW COMPARISON:  05/26/2020 FINDINGS: Cardiac shadow is stable. Esophageal stent is again noted and stable. Two right-sided chest tubes are seen in satisfactory position. The overall appearance is stable. Persistent opacification of the right hemithorax is again noted similar to that seen on recent plain film evaluation. No new focal abnormality is noted. IMPRESSION: Stable appearance of the chest with consolidation in the right hemithorax. Electronically Signed   By: Inez Catalina M.D.   On: 05/27/2020 07:22   DG Chest Port 1 View  Result Date: 05/25/2020 CLINICAL DATA:  Leak EXAM: PORTABLE CHEST 1 VIEW COMPARISON:  May 23, 2020 FINDINGS: The cardiomediastinal silhouette is unchanged in contour.RIGHT-sided chest tubes. Moderate RIGHT pleural effusion. Esophageal stent projects over the mediastinum inferiorly. No pneumothorax. Persistent RIGHT-sided heterogeneous opacities are not significantly changed in comparison to prior. Minimal opacities of the LEFT lung. No acute osseous abnormality. IMPRESSION: Esophageal stent projects over the inferior mediastinum. Electronically Signed   By: Valentino Saxon MD   On: 05/25/2020 11:57   DG CHEST PORT 1 VIEW  Result Date: 05/22/2020 CLINICAL DATA:  59 year old male with perforated esophagus status post surgical exploration, esophageal stent placement and percutaneous gastrostomy tube placement postoperative day 5. Shortness of breath. EXAM: PORTABLE CHEST 1 VIEW COMPARISON:  Portable chest 0535 hours today and earlier. FINDINGS: Portable AP semi upright view at 2330 hours. Stable mediastinal contours.  Esophageal stent configuration appears stable since 05/21/2020. But there is increasing  confluent right lung base opacity. A single right right chest tube remains in place. No pneumothorax identified. The left lung remains clear allowing for portable technique. Visualized tracheal air column is within normal limits. IMPRESSION: 1. Increasing right lung base opacity since yesterday, indeterminate for pleural fluid versus consolidation. 2. Single right chest tube remains in place, no pneumothorax. 3. Esophageal stent configuration appears stable. Electronically Signed   By: Genevie Ann M.D.   On: 05/22/2020 23:39   DG CHEST PORT 1 VIEW  Result Date: 05/22/2020 CLINICAL DATA:  Follow-up pneumothorax EXAM: PORTABLE CHEST 1 VIEW COMPARISON:  05/21/2020 FINDINGS: Cardiac shadow is stable. Esophageal stent is again noted and stable. Right thoracostomy catheter is seen without evidence of pneumothorax. Right basilar atelectasis and small effusion are seen. The left lung is clear. IMPRESSION: No pneumothorax is noted. Small right pleural effusion and basilar atelectasis. Electronically Signed   By: Inez Catalina M.D.   On: 05/22/2020 08:03   DG Chest Port 1 View  Result Date: 05/21/2020 CLINICAL DATA:  Postop from repair of esophageal rupture. EXAM: PORTABLE CHEST 1 VIEW COMPARISON:  05/20/2020 FINDINGS: An endoluminal stent is seen within the thoracic esophagus. 2 right-sided chest tubes remain in place. Small approximately 10% right pneumothorax is noted, which appears slightly increased in size since previous study. Bibasilar atelectasis is again seen, right side greater than left, without significant change. Mild cardiomegaly remains stable. IMPRESSION: Small approximately 10% right pneumothorax, slightly increased in size since prior study. Stable bibasilar atelectasis, right side greater than left. Electronically Signed   By: Marlaine Hind M.D.   On: 05/21/2020 08:07   DG Chest Port 1 View  Result Date: 05/20/2020 CLINICAL DATA:  Shortness of breath. RIGHT-sided chest tube present. EXAM: PORTABLE  CHEST 1 VIEW COMPARISON:  05/19/2020 FINDINGS: Patient has dual RIGHT-sided chest tubes, unchanged in position. There is no pneumothorax. There is persistent RIGHT pleural effusion and significant opacity at the RIGHT lung base which partially obscures the hemidiaphragm. Subsegmental atelectasis at the LEFT lung base. Stable appearance of esophageal stent. IMPRESSION: 1. Stable appearance of the chest. 2. No pneumothorax. 3. Persistent RIGHT basilar opacity and pleural effusion. Electronically Signed   By: Nolon Nations M.D.   On: 05/20/2020 09:51   DG CHEST PORT 1 VIEW  Result Date: 05/19/2020 CLINICAL DATA:  Empyema EXAM: PORTABLE CHEST 1 VIEW COMPARISON:  05/18/2020 FINDINGS: Two right apical chest tubes.  No pneumothorax is seen. Patchy right mid/lower lung opacities, reflecting a combination of pneumonia and known complex right pleural fluid collection/empyema. Mild patchy opacity in the left mid/lower lung, likely reflecting pneumonia. Stable esophageal stent. Cardiomegaly. IMPRESSION: Two right apical chest tubes.  No pneumothorax is seen. Multifocal pneumonia, grossly unchanged. Superimposed complex right pleural fluid collection/empyema. Esophageal stent. Electronically Signed   By: Julian Hy M.D.   On: 05/19/2020 07:12   DG CHEST PORT 1 VIEW  Result Date: 05/18/2020 CLINICAL DATA:  Esophageal perforation. EXAM: PORTABLE CHEST 1 VIEW COMPARISON:  05/17/2020. FINDINGS: Esophageal stent stable position. Two right chest tubes in stable position. Stable cardiomegaly. No pulmonary venous congestion. Low lung volumes with bibasilar atelectasis. Bilateral pulmonary infiltrates/edema again noted. Interim slight increase in right-sided pleural effusion. No pneumothorax noted on today's exam. IMPRESSION: 1. Esophageal stent and 2 right chest tubes noted in stable position. No pneumothorax noted on today's exam. Interim slight increase in right pleural effusion. 2. Low lung volumes with bibasilar  atelectasis. Persistent bilateral  pulmonary infiltrates/edema without interim change. 3.  Stable cardiomegaly.  No pulmonary venous congestion Electronically Signed   By: Marcello Moores  Register   On: 05/18/2020 05:41   DG Chest Port 1 View  Result Date: 05/17/2020 CLINICAL DATA:  Esophageal stent placement. EXAM: PORTABLE CHEST 1 VIEW COMPARISON:  Chest CT yesterday. FINDINGS: Interval placement of esophageal stent. Two right chest tubes in place. Decreased right hydropneumothorax. Persistent pleural fluid in extrapleural air at the right lung base. Patchy opacity in the right mid lower lung zone. Small left pleural effusion and basilar opacities. Heart size grossly stable. There is contrast within the stomach. IMPRESSION: 1. Interval placement of esophageal stent. 2. Two right chest tubes in place with decreased right hydropneumothorax. Small volume of residual pleural fluid in extrapleural air at the right lung base. 3. Patchy opacity in the right mid and lower lung zone, may represent atelectasis or re-expansion pulmonary edema. 4. Small left pleural effusion and basilar opacities. Electronically Signed   By: Keith Rake M.D.   On: 05/17/2020 19:57   DG CHEST PORT 1 VIEW  Result Date: 05/16/2020 CLINICAL DATA:  Chest tube insertion for pneumothorax. EXAM: PORTABLE CHEST 1 VIEW COMPARISON:  May 16, 2020 FINDINGS: A right-sided chest tube is noted. Its distal end is kinked and is seen overlying the lateral aspect of the right upper quadrant. A large, stable right-sided hydropneumothorax is seen with subsequent collapse of the right lung. This is unchanged in appearance when compared to the prior study. Stable mild to moderate severity atelectasis and/or infiltrate is noted within the left lung base. The heart size and mediastinal contours are within normal limits. The visualized skeletal structures are unremarkable. IMPRESSION: 1. Stable large right-sided hydropneumothorax with subsequent collapse of the  right lung. 2. Stable mild to moderate severity left basilar atelectasis and/or infiltrate. 3. Right-sided chest tube with its distal end kinked and seen overlying the right upper quadrant. Electronically Signed   By: Virgina Norfolk M.D.   On: 05/16/2020 16:52   DG CHEST PORT 1 VIEW  Result Date: 05/16/2020 CLINICAL DATA:  Pneumothorax post recent thoracentesis, shortness of breath EXAM: PORTABLE CHEST 1 VIEW COMPARISON:  Portable exam 0849 hours compared to 05/14/2020 FINDINGS: Stable heart size and mediastinal contours. RIGHT hydropneumothorax, with increased collapse of RIGHT lung since previous exam. Minimal LEFT pleural effusion with atelectasis versus infiltrate LEFT lower lobe. LEFT upper lung clear. Bones demineralized. IMPRESSION: RIGHT hydropneumothorax with increased collapse of RIGHT lung since previous exam. Minimal LEFT pleural effusion with atelectasis versus infiltrate at LEFT lower lobe. Findings called to Dr. Elsworth Soho on 05/16/2020 at 0922 hours. Electronically Signed   By: Lavonia Dana M.D.   On: 05/16/2020 09:38   DG Chest Port 1 View  Result Date: 05/10/2020 CLINICAL DATA:  Questionable sepsis.  Pneumonia diagnosis yesterday. EXAM: PORTABLE CHEST 1 VIEW COMPARISON:  Radiograph yesterday. FINDINGS: Upper normal heart size. Unchanged mediastinal contours. Hazy symmetric bibasilar opacities are unchanged from radiograph yesterday. No pulmonary edema. No pneumothorax. IMPRESSION: Unchanged symmetric bibasilar opacities are unchanged from yesterday, likely combination of pleural effusions and atelectasis/pneumonia. Electronically Signed   By: Keith Rake M.D.   On: 05/10/2020 21:24   DG Chest Port 1 View  Result Date: 05/09/2020 CLINICAL DATA:  Cough and fever. EXAM: PORTABLE CHEST 1 VIEW COMPARISON:  09/06/2018 FINDINGS: Heart size is normal. There are bilateral pleural effusions and infiltrate/atelectasis in both lower lungs, consistent with pneumonia. Upper lungs are clear.  IMPRESSION: Bilateral lower lobe atelectasis and pneumonia.  Small effusions.  Electronically Signed   By: Nelson Chimes M.D.   On: 05/09/2020 14:16   DG Chest Port 1V same Day  Result Date: 05/14/2020 CLINICAL DATA:  Follow-up right-sided pneumothorax. EXAM: PORTABLE CHEST 1 VIEW COMPARISON:  May 14, 2020 (3:02 p.m.) FINDINGS: Stable moderate to marked severity atelectasis and/or infiltrate is noted within the right lung base with stable mild to moderate severity left basilar atelectasis and/or infiltrate seen. There is a small, stable left pleural effusion with a stable moderate sized right pleural effusion. A stable moderate sized right apical pneumothorax is seen. A mild amount of radiopaque contrast is again seen along the infrahilar region on the right. The heart size and mediastinal contours are within normal limits. The visualized skeletal structures are unremarkable. IMPRESSION: No significant interval change when compared to the prior study dated May 14, 2020 (3:02 p.m.) Electronically Signed   By: Virgina Norfolk M.D.   On: 05/14/2020 19:27   DG Swallowing Func-Speech Pathology  Result Date: 05/14/2020 Objective Swallowing Evaluation: Type of Study: MBS-Modified Barium Swallow Study  Patient Details Name: Markhi Kleckner MRN: [id number] Date of Birth: Jun 28, 1961 Today's Date: 05/14/2020 Time: SLP Start Time (ACUTE ONLY): 51 -SLP Stop Time (ACUTE ONLY): 1300 SLP Time Calculation (min) (ACUTE ONLY): 30 min Past Medical History: Past Medical History: Diagnosis Date . Cocaine abuse ([place])  . ETOH abuse  . GERD (gastroesophageal reflux disease)  . Hyperlipidemia  . 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 BIOPSY  08/25/2018  Procedure: BIOPSY;  Surgeon: [name], MD;  [facility];  Service: Endoscopy;; . BIOPSY  12/13/2018  Procedure: BIOPSY;  Surgeon: [name], MD;  Location[facility];  Service: Endoscopy;;  esophagus . COLONOSCOPY WITH PROPOFOL N/A 04/07/2019  Dr. [name]: 3 Tubular adenomas removed (4 to 8 mm in size), next colonoscopy 3 years. . ESOPHAGOGASTRODUODENOSCOPY (EGD) WITH PROPOFOL N/A 08/25/2018  Dr. [name]: Severely inflamed abnormal appearing mid/distal esophagus.  Encroachment on lumen suspicious for infiltrating neoplasm located be all from severe benign inflammation, extensive gastric erosions and extensive duodenal ulcerations.  Query ischemic process versus other.  Biopsy revealed Barrett's esophagus but negative for malignancy and H. pylori. . ESOPHAGOGASTRODUODENOSCOPY (EGD) WITH PROPOFOL N/A 12/13/2018  Dr. [name]: peptic stricture s/p balloon dilatation. abnormal esophageal mucosa, by c/w barrett's without dysplasia, previous PUD completely healed.  Marland Kitchen POLYPECTOMY  04/07/2019  Procedure: POLYPECTOMY;  Surgeon: [name], MD;  [facility]: [facility];  Service: Endoscopy;;  colon HPI: 59 y.o. male with medical history significant for prior stroke, hyperlipidemia, GERD, BPH, barrett's esophagus    and remote history of cocaine abuse who presents to the emergency department due to 5 days of weakness, complains of productive cough with clear phlegm with occasional black speckles with associated reproducible midsternal chest pain. Patient was seen in the ED 2 days ago (7/28) due to similar symptoms, during which he reported a max temperature of 100F.  Chest x-ray done at that time showed pneumonia and patient was treated with IV fluid.  Amoxicillin and doxycycline were prescribed on the was advised to follow-up with his primary care physician.  Patient returned to the ED yesterday due to increased heart rate and sob.  He states that he was taking his blood pressure medication as prescribed and denies cocaine use. Bilateral pleural effusions and lower lobe consolidation similar to that seen on recent chest x-ray. This is consistent with bibasilar pneumonia and reactive  effusions. Diffuse wall thickening throughout the esophagus likely related to reflux esophagitis. Clinical correlation is recommended. It should be noted this is stable from a prior exam of 2019.  Subjective: "My chest hurts when I am coughing sometimes." Assessment / Plan / Recommendation CHL IP CLINICAL IMPRESSIONS 05/14/2020 Clinical Impression Pt presents with mild oropharyngeal phase dysphagia characterized by lingual pumping with reduced bolus cohesiveness and premature spillage of liquids to the valleculae. Hyolaryngeal excursion appeared WNL. Pt without penetration/aspiration across consistencies/textures and only with trace amounts of thin liquid residuals along base of tongue and in lateral channels which cleared with secondary swallow. Pt is currently on 4L O2 via nasal cannula. Risk for aspiration with po intake appears minimal, however his respiratory status slightly increases his risk. Pt also with history of esophageal dysphagia. Recommend regular textures and thin liquids with standard aspiration and reflux precautions. No further SLP services indicated at this time. This study was reviewed with Dr. Elsworth Soho (pulmonary).  SLP Visit Diagnosis Dysphagia, oropharyngeal phase (R13.12) Attention and concentration deficit following -- Frontal lobe and executive function deficit following -- Impact on safety and function Mild aspiration risk   CHL IP TREATMENT RECOMMENDATION 05/14/2020 Treatment Recommendations No treatment recommended at this time   Prognosis 05/14/2020 Prognosis for Safe Diet Advancement Good Barriers to Reach Goals -- Barriers/Prognosis Comment -- CHL IP DIET RECOMMENDATION 05/14/2020 SLP Diet Recommendations Regular solids;Thin liquid Liquid Administration via Cup;Straw Medication Administration Whole meds with liquid Compensations Slow rate;Small sips/bites Postural Changes Seated upright at 90 degrees;Remain semi-upright after after feeds/meals (Comment)   CHL IP OTHER RECOMMENDATIONS 05/14/2020  Recommended Consults -- Oral Care Recommendations Oral care BID Other Recommendations Clarify dietary restrictions   CHL IP FOLLOW UP RECOMMENDATIONS 05/14/2020 Follow up Recommendations None   CHL IP FREQUENCY AND DURATION 05/14/2020 Speech Therapy Frequency (ACUTE ONLY) min 2x/week Treatment Duration 1 week      CHL IP ORAL PHASE 05/14/2020 Oral Phase Impaired Oral - Pudding Teaspoon -- Oral - Pudding Cup -- Oral - Honey Teaspoon -- Oral - Honey Cup -- Oral - Nectar Teaspoon -- Oral - Nectar Cup -- Oral - Nectar Straw -- Oral - Thin Teaspoon Premature spillage Oral - Thin Cup Premature spillage;Decreased bolus cohesion Oral - Thin Straw Premature spillage Oral - Puree Lingual pumping Oral - Mech Soft -- Oral - Regular Delayed oral transit Oral - Multi-Consistency -- Oral - Pill -- Oral Phase - Comment --  CHL IP PHARYNGEAL PHASE 05/14/2020 Pharyngeal Phase Impaired Pharyngeal- Pudding Teaspoon -- Pharyngeal -- Pharyngeal- Pudding Cup -- Pharyngeal -- Pharyngeal- Honey Teaspoon -- Pharyngeal -- Pharyngeal- Honey Cup -- Pharyngeal -- Pharyngeal- Nectar Teaspoon -- Pharyngeal -- Pharyngeal- Nectar Cup -- Pharyngeal -- Pharyngeal- Nectar Straw -- Pharyngeal -- Pharyngeal- Thin Teaspoon Delayed swallow initiation-vallecula Pharyngeal -- Pharyngeal- Thin Cup Delayed swallow initiation-vallecula;Lateral channel residue Pharyngeal -- Pharyngeal- Thin Straw Lateral channel residue;Delayed swallow initiation-vallecula Pharyngeal -- Pharyngeal- Puree Delayed swallow initiation-vallecula;WFL Pharyngeal -- Pharyngeal- Mechanical Soft -- Pharyngeal -- Pharyngeal- Regular WFL Pharyngeal -- Pharyngeal- Multi-consistency -- Pharyngeal -- Pharyngeal- Pill WFL Pharyngeal -- Pharyngeal Comment --  CHL IP CERVICAL ESOPHAGEAL PHASE 05/14/2020 Cervical Esophageal Phase WFL Pudding Teaspoon -- Pudding Cup -- Honey Teaspoon -- Honey Cup -- Nectar Teaspoon -- Nectar Cup -- Nectar Straw -- Thin Teaspoon -- Thin Cup -- Thin Straw -- Puree --  Mechanical Soft -- Regular -- Multi-consistency -- Pill -- Cervical Esophageal Comment -- Thank you, Genene Churn, [place] PORTER,DABNEY 05/14/2020, 2:21 PM  DG C-Arm 1-60 Min  Result Date: 05/25/2020 CLINICAL DATA:  Esophageal stent placement. EXAM: DG C-ARM 1-60 MIN; CHEST  1 VIEW FLUOROSCOPY TIME:  Fluoro time: 4 Minutes 17 secondsRadiation exposure index: MGy: 41.69Number of acquired spot images: Five spot fluoro graphic images. COMPARISON:  May 17, 2020.  May 23, 2020 FINDINGS: Spot fluoroscopy images were obtained for surgical planning purposes. Esophageal stent is seen traversing the thorax. Enteric contrast traverses the stent and enters the proximal stomach. None of the provided radiographs demonstrate evaluation of the site of previous leak into the RIGHT pleural space. Selective LEFT mainstem intubation. IMPRESSION: Spot fluoroscopy images from surgical planning purposes. Please reference procedure report for further details. Electronically Signed   By: Valentino Saxon MD   On: 05/25/2020 10:59   DG C-Arm 1-60 Min  Result Date: 05/17/2020 CLINICAL DATA:  Surgery. Esophageal stent placement. EXAM: DG C-ARM 1-60 MIN; CHEST  1 VIEW FLUOROSCOPY TIME:  Fluoroscopy Time:  5 minutes 48 seconds Radiation Exposure Index (if provided by the fluoroscopic device): 59.06 mGy Number of Acquired Spot Images: 3 COMPARISON:  Preprocedural CT yesterday. FINDINGS: Three fluoroscopic spot views obtained in the operating room. Endoscope is in place. A stent overlies the endoscope with tip projecting over the esophageal diaphragmatic hiatus. Subsequent enteric tube courses in the stent. There is contrast opacifying the distal stent on image 1. Right chest tube is in place. IMPRESSION: Intraoperative fluoroscopy during esophageal stent placement. Please reference procedural report for details. Electronically Signed   By: Keith Rake M.D.   On: 05/17/2020 19:55   ECHOCARDIOGRAM  COMPLETE  Result Date: 05/11/2020    ECHOCARDIOGRAM REPORT   Patient Name:   Evan Chavez Date of Exam: 05/11/2020 Medical Rec #:  [id number]           Height:       69.0 in Accession #:    [id number]          Weight:       153.0 lb Date of Birth:  May 20, 1961           BSA:          1.844 m Patient Age:    24 years            BP:           141/90 mmHg Patient Gender: M                   HR:           100 bpm. Exam Location:  [facility] Procedure: 2D Echo, Cardiac Doppler and Color Doppler Indications:    CHF  History:        Patient has prior history of Echocardiogram examinations, most                 recent 07/13/2018. CHF, Stroke, Signs/Symptoms:Shortness of                 Breath; Risk Factors:Hypertension and Dyslipidemia. Cocaine                 abuse, Pneumonia.  Sonographer:    Dustin Flock RDCS Referring Phys: [id number] [place]  1. Left ventricular ejection fraction, by estimation, is 65 to 70%. The left ventricle has normal function. The left ventricle has no regional wall motion abnormalities. Left ventricular diastolic parameters are consistent with Grade I diastolic dysfunction (impaired relaxation).  2. Right ventricular systolic function is normal. The right ventricular size is normal. Tricuspid  regurgitation signal is inadequate for assessing PA pressure.  3. The mitral valve is grossly normal. Trivial mitral valve regurgitation.  4. The aortic valve is tricuspid. Aortic valve regurgitation is not visualized.  5. The inferior vena cava is normal in size with greater than 50% respiratory variability, suggesting right atrial pressure of 3 mmHg. FINDINGS  Left Ventricle: Left ventricular ejection fraction, by estimation, is 65 to 70%. The left ventricle has normal function. The left ventricle has no regional wall motion abnormalities. The left ventricular internal cavity size was normal in size. There is  no left ventricular hypertrophy. Left ventricular diastolic parameters  are consistent with Grade I diastolic dysfunction (impaired relaxation). Right Ventricle: The right ventricular size is normal. No increase in right ventricular wall thickness. Right ventricular systolic function is normal. Tricuspid regurgitation signal is inadequate for assessing PA pressure. Left Atrium: Left atrial size was normal in size. Right Atrium: Right atrial size was normal in size. Pericardium: There is no evidence of pericardial effusion. Mitral Valve: The mitral valve is grossly normal. Trivial mitral valve regurgitation. Tricuspid Valve: The tricuspid valve is grossly normal. Tricuspid valve regurgitation is mild. Aortic Valve: The aortic valve is tricuspid. Aortic valve regurgitation is not visualized. Pulmonic Valve: The pulmonic valve was grossly normal. Pulmonic valve regurgitation is trivial. Aorta: The aortic root is normal in size and structure. Venous: The inferior vena cava is normal in size with greater than 50% respiratory variability, suggesting right atrial pressure of 3 mmHg. IAS/Shunts: No atrial level shunt detected by color flow Doppler.  LEFT VENTRICLE PLAX 2D LVIDd:         4.30 cm  Diastology LVIDs:         2.68 cm  LV e' lateral:   6.42 cm/s LV PW:         0.82 cm  LV E/e' lateral: 11.1 LV IVS:        0.98 cm  LV e' medial:    5.11 cm/s LVOT diam:     2.20 cm  LV E/e' medial:  13.9 LV SV:         81 LV SV Index:   44 LVOT Area:     3.80 cm  RIGHT VENTRICLE RV Basal diam:  2.99 cm RV S prime:     21.60 cm/s TAPSE (M-mode): 3.1 cm LEFT ATRIUM             Index       RIGHT ATRIUM           Index LA diam:        3.50 cm 1.90 cm/m  RA Area:     14.20 cm LA Vol (A2C):   42.8 ml 23.21 ml/m RA Volume:   31.20 ml  16.92 ml/m LA Vol (A4C):   46.7 ml 25.33 ml/m LA Biplane Vol: 45.6 ml 24.73 ml/m  AORTIC VALVE LVOT Vmax:   124.00 cm/s LVOT Vmean:  72.900 cm/s LVOT VTI:    0.212 m  AORTA Ao Root diam: 3.00 cm MITRAL VALVE MV Area (PHT): 6.65 cm     SHUNTS MV Decel Time: 114 msec      Systemic VTI:  0.21 m MV E velocity: 71.10 cm/s   Systemic Diam: 2.20 cm MV A velocity: 113.00 cm/s MV E/A ratio:  0.63 Rozann Lesches MD Electronically signed by Rozann Lesches MD Signature Date/Time: 05/11/2020/4:35:39 PM    Final    DG ESOPHAGUS W SINGLE CM (SOL OR THIN BA)  Result Date: 05/28/2020 CLINICAL  DATA:  Esophageal stent with prior leak, reassessment for leak EXAM: ESOPHOGRAM/BARIUM SWALLOW TECHNIQUE: Single contrast examination was performed using 25 cc Omnipaque 300. FLUOROSCOPY TIME:  Fluoroscopy Time:  3 minutes, 24 seconds Radiation Exposure Index (if provided by the fluoroscopic device): 33 mGy Number of Acquired Spot Images: 0 COMPARISON:  Multiple exams, including 05/23/2020 FINDINGS: Mid to distal expandable esophageal stent noted. The prior right-sided leak from the distal margin of the stent is no longer present. Contrast medium extending around the distal margin of the stent primarily on the left side appears to be intraluminal and likely in between the stent and the wall of the distal esophagus or small hiatal hernia based on the contained appearance for example on image 26/8. This collection was noted to extend proximally along the distal half of the stent along the left side. This is not unexpected given the appearance on the chest CT of 05/24/2020, which showed some luminal gas tracking along the left side of the stent in this vicinity. Accordingly, no leak from the esophagus is currently identified. IMPRESSION: 1. No esophageal leak is currently identified. 2. A small amount of intraluminal contrast extends around the stent distally, but this is felt to be due to incomplete apposition of the stent with the lumen, rather than a leak. Electronically Signed   By: Van Clines M.D.   On: 05/28/2020 08:29   DG ESOPHAGUS W SINGLE CM (SOL OR THIN BA)  Result Date: 05/23/2020 CLINICAL DATA:  Postop esophageal perforation 05/17/2020. EXAM: ESOPHOGRAM/BARIUM SWALLOW TECHNIQUE:  Single contrast examination was performed using  water-soluble. FLUOROSCOPY TIME:  Fluoroscopy Time:  1 minutes 36 seconds Radiation Exposure Index (if provided by the fluoroscopic device): Number of Acquired Spot Images: 5 COMPARISON:  CT chest 05/16/2020. FINDINGS: In the recumbent LPO position, patient drank water soluble Omnipaque 300. There is a long esophageal wall stent centered in the mid esophagus. Leakage of contrast from the right posterolateral wall of the distal esophagus, in the region of the distal aspect of the wall stent, is seen with filling of the right pleural space. IMPRESSION: Persistent leak from the right posterolateral aspect of the esophagus, along the distal aspect of the stent, with filling of the adjacent right pleural space. Electronically Signed   By: Lorin Picket M.D.   On: 05/23/2020 10:05   US THORACENTESIS ASP PLEURAL SPACE W/IMG GUIDE  Result Date: 05/14/2020 INDICATION: Complicated RIGHT pleural effusion EXAM: ULTRASOUND GUIDED DIAGNOSTIC RIGHT THORACENTESIS MEDICATIONS: None. COMPLICATIONS: Probable RIGHT pneumothorax, see chest radiograph report PROCEDURE: An ultrasound guided thoracentesis was thoroughly discussed with the patient and questions answered. The benefits, risks, alternatives and complications were also discussed. The patient understands and wishes to proceed with the procedure. Written consent was obtained. Ultrasound was performed to localize and mark an adequate pocket of fluid in the posterior RIGHT chest. The area was then prepped and draped in the normal sterile fashion. 1% Lidocaine was used for local anesthesia. Under ultrasound guidance a 5 [place] Yueh catheter was introduced. Thoracentesis was performed. The catheter was removed and a dressing applied. FINDINGS: A total of approximately 30 mL of cloudy yellow slightly thicker RIGHT pleural fluid was removed. Samples were sent to the laboratory as requested by the clinical team. IMPRESSION:  Successful ultrasound guided diagnostic RIGHT thoracentesis yielding 30 mL of cloudy yellow RIGHT pleural fluid, cannot exclude infection/empyema with this appearance. Findings discussed with Dr. Elsworth Soho on 05/14/2020 at 1545 hours. Electronically Signed   By: Lavonia Dana M.D.   On: 05/14/2020  15:50     Time Spent in minutes  30     Desiree Hane M.D on 06/03/2020 at 12:11 PM  To page go to [email or web address] - password [facility]

## 2020-06-03 NOTE — Progress Notes (Addendum)
      [address]       [place] [address]             [phone]      9 Days Post-Op Procedure(s) (LRB): ESOPHAGOGASTRODUODENOSCOPY (EGD); ESOPHAGEAL STENT REMOVAL. (N/A) ESOPHAGEAL STENT PLACEMENT USING A 23 MM COVERED STENT (N/A) VIDEO ASSISTED THORACOSCOPY (VATS)/EMPYEMA (Right)   Subjective:  No complaints. Remains tachypneic, trying to nap  Objective: Vital signs in last 24 hours: Temp:  [97.6 F (36.4 C)-99.8 F (37.7 C)] 98.7 F (37.1 C) (08/22 0700) Pulse Rate:  [104-125] 119 (08/22 0700) Cardiac Rhythm: Sinus tachycardia (08/22 0704) Resp:  [19-37] 25 (08/22 0412) BP: (131-141)/(82-93) 133/83 (08/22 0300) SpO2:  [96 %-100 %] 97 % (08/22 0700) Weight:  [57.8 kg] 57.8 kg (08/22 0300)  Intake/Output from previous day: 08/21 0701 - 08/22 0700 In: 836.4 [P.O.:50; NG/GT:666.4] Out: 1322.3 [Urine:1320; Drains:2.3]  General appearance: cooperative and no distress Heart: regular rate and rhythm Lungs: diminished breath sounds RLL and tacypneic Abdomen: soft, non-tender; bowel sounds normal; no masses,  no organomegaly Wound: clean and dry  Lab Results: Recent Labs    06/02/20 0038 06/03/20 0121  WBC 21.2* 14.9*  HGB 9.4* 9.2*  HCT 30.3* 30.3*  PLT 688* 697*   BMET:  Recent Labs    06/02/20 0038 06/03/20 0121  NA 134* 138  K 4.5 4.6  CL 103 103  CO2 24 22  GLUCOSE 151* 123*  BUN 18 19  CREATININE 0.90 0.83  CALCIUM 8.5* 9.0    PT/INR:  Recent Labs    06/02/20 1524  LABPROT 14.5  INR 1.2   ABG    Component Value Date/Time   PHART 7.443 05/26/2020 0335   HCO3 23.5 05/26/2020 0335   TCO2 27 05/17/2020 2036   ACIDBASEDEF 0.1 05/26/2020 0335   O2SAT 96.7 05/26/2020 0335   CBG (last 3)  Recent Labs    06/02/20 1614 06/03/20 0619 06/03/20 0746  GLUCAP 136* 138* 145*    Assessment/Plan: S/P Procedure(s) (LRB): ESOPHAGOGASTRODUODENOSCOPY (EGD); ESOPHAGEAL STENT REMOVAL. (N/A) ESOPHAGEAL STENT PLACEMENT USING A 23  MM COVERED STENT (N/A) VIDEO ASSISTED THORACOSCOPY (VATS)/EMPYEMA (Right)  1. CV- tachycardic, hemodynamically stable 2. Pulm- CT chest confirmed RLL pneumonia, shows improvement of right posterior empyema with chest tube in good position, remains tachypenic, poor pulmonary toilet effort 3. ID- no fever since 0900 yesterday, leukocytes down to 14.9, blood cultures are negative so far, he has been restarted on Vanc and Cefipime, remains on diflucan.Marland Kitchen appreciate medicine assistance 4. GI- remains on clear liquid diet, will continue, diet advancement will remain slow 5. Deconditioning- CIR vs. SNF once patients medical status improves 6. Dispo- patient with sepsis, likely due to RLL... has been restarted on broad spectrum ABX, continue antifungals... blood cultures are negative so far, leukocytes have improved.. continue chest drain as in good position with improvement of empyema, continue current care   LOS: 23 days    Ellwood Handler, PA-C 06/03/2020    Chart reviewed, patient examined, agree with above. Afebrile since yesterday AM and WBC coming down from 21 yesterday to 15 today. On Vanc and Maxipime for probable RLL pneumonia seen on CT. RR 30's but sats 98% on RA. Continue close observation. Repeat CXR in am.

## 2020-06-03 NOTE — Progress Notes (Signed)
Patient is anxious this morning and was only able to perform flutter x4 instead of 10 times.

## 2020-06-03 NOTE — Progress Notes (Signed)
Patient refusing flutter at this time.

## 2020-06-04 ENCOUNTER — Inpatient Hospital Stay (HOSPITAL_COMMUNITY): Payer: Medicaid Other

## 2020-06-04 DIAGNOSIS — R1312 Dysphagia, oropharyngeal phase: Secondary | ICD-10-CM

## 2020-06-04 DIAGNOSIS — E440 Moderate protein-calorie malnutrition: Secondary | ICD-10-CM

## 2020-06-04 DIAGNOSIS — R1310 Dysphagia, unspecified: Secondary | ICD-10-CM

## 2020-06-04 LAB — GLUCOSE, CAPILLARY
Glucose-Capillary: 124 mg/dL — ABNORMAL HIGH (ref 70–99)
Glucose-Capillary: 137 mg/dL — ABNORMAL HIGH (ref 70–99)
Glucose-Capillary: 137 mg/dL — ABNORMAL HIGH (ref 70–99)
Glucose-Capillary: 135 mg/dL — ABNORMAL HIGH (ref 70–99)
Glucose-Capillary: 148 mg/dL — ABNORMAL HIGH (ref 70–99)
Glucose-Capillary: 144 mg/dL — ABNORMAL HIGH (ref 70–99)

## 2020-06-04 LAB — CBC WITH DIFFERENTIAL/PLATELET
Abs Immature Granulocytes: 0.05 10*3/uL (ref 0.00–0.07)
Basophils Absolute: 0 10*3/uL (ref 0.0–0.1)
Basophils Relative: 0 %
Eosinophils Absolute: 0 10*3/uL (ref 0.0–0.5)
Eosinophils Relative: 0 %
HCT: 30 % — ABNORMAL LOW (ref 39.0–52.0)
Hemoglobin: 9.1 g/dL — ABNORMAL LOW (ref 13.0–17.0)
Immature Granulocytes: 1 %
Lymphocytes Relative: 8 %
Lymphs Abs: 0.8 10*3/uL (ref 0.7–4.0)
MCH: 25.7 pg — ABNORMAL LOW (ref 26.0–34.0)
MCHC: 30.3 g/dL (ref 30.0–36.0)
MCV: 84.7 fL (ref 80.0–100.0)
Monocytes Absolute: 0.8 10*3/uL (ref 0.1–1.0)
Monocytes Relative: 8 %
Neutro Abs: 8.1 10*3/uL — ABNORMAL HIGH (ref 1.7–7.7)
Neutrophils Relative %: 83 %
Platelets: 581 10*3/uL — ABNORMAL HIGH (ref 150–400)
RBC: 3.54 MIL/uL — ABNORMAL LOW (ref 4.22–5.81)
RDW: 15.2 % (ref 11.5–15.5)
WBC: 9.9 10*3/uL (ref 4.0–10.5)
nRBC: 0 % (ref 0.0–0.2)

## 2020-06-04 LAB — COMPREHENSIVE METABOLIC PANEL
ALT: 213 U/L — ABNORMAL HIGH (ref 0–44)
AST: 96 U/L — ABNORMAL HIGH (ref 15–41)
Albumin: 2 g/dL — ABNORMAL LOW (ref 3.5–5.0)
Alkaline Phosphatase: 295 U/L — ABNORMAL HIGH (ref 38–126)
Anion gap: 13 (ref 5–15)
BUN: 16 mg/dL (ref 6–20)
CO2: 22 mmol/L (ref 22–32)
Calcium: 8.7 mg/dL — ABNORMAL LOW (ref 8.9–10.3)
Chloride: 103 mmol/L (ref 98–111)
Creatinine, Ser: 0.86 mg/dL (ref 0.61–1.24)
GFR calc Af Amer: >60 mL/min (ref >60)
GFR calc non Af Amer: >60 mL/min (ref >60)
Glucose, Bld: 138 mg/dL — ABNORMAL HIGH (ref 70–99)
Potassium: 4.4 mmol/L (ref 3.5–5.1)
Sodium: 138 mmol/L (ref 135–145)
Total Bilirubin: 0.5 mg/dL (ref 0.3–1.2)
Total Protein: 7.6 g/dL (ref 6.5–8.1)

## 2020-06-04 MED ORDER — SODIUM CHLORIDE 0.9 % IV SOLN: INTRAVENOUS | Status: AC

## 2020-06-04 NOTE — Progress Notes (Addendum)
      [address]       [place] [address]             [phone]      10 Days Post-Op Procedure(s) (LRB): ESOPHAGOGASTRODUODENOSCOPY (EGD); ESOPHAGEAL STENT REMOVAL. (N/A) ESOPHAGEAL STENT PLACEMENT USING A 23 MM COVERED STENT (N/A) VIDEO ASSISTED THORACOSCOPY (VATS)/EMPYEMA (Right) Subjective: Not very vocal this morning. Looks tired.   Objective: Vital signs in last 24 hours: Temp:  [98.4 F (36.9 C)-100.4 F (38 C)] 99.6 F (37.6 C) (08/23 0300) Pulse Rate:  [111-123] 111 (08/22 2300) Cardiac Rhythm: Sinus tachycardia (08/23 0704) Resp:  [18-24] 18 (08/23 0300) BP: (141-148)/(81-82) 146/82 (08/23 0300) SpO2:  [97 %-98 %] 97 % (08/23 0300) Weight:  [58.5 kg] 58.5 kg (08/23 0500)     Intake/Output from previous day: 08/22 0701 - 08/23 0700 In: 442.6 [IV Piggyback:442.6] Out: 700 [Urine:700] Intake/Output this shift: No intake/output data recorded.  General appearance: cooperative and no distress Heart: sinus tachycardia Lungs: rhonchi bilaterally R> L Abdomen: soft, non-tender; bowel sounds normal; no masses,  no organomegaly Extremities: extremities normal, atraumatic, no cyanosis or edema Wound: clean and dry  Lab Results: Recent Labs    06/03/20 0121 06/04/20 0118  WBC 14.9* 9.9  HGB 9.2* 9.1*  HCT 30.3* 30.0*  PLT 697* 581*   BMET:  Recent Labs    06/03/20 0121 06/04/20 0118  NA 138 138  K 4.6 4.4  CL 103 103  CO2 22 22  GLUCOSE 123* 138*  BUN 19 16  CREATININE 0.83 0.86  CALCIUM 9.0 8.7*    PT/INR:  Recent Labs    06/02/20 1524  LABPROT 14.5  INR 1.2   ABG    Component Value Date/Time   PHART 7.443 05/26/2020 0335   HCO3 23.5 05/26/2020 0335   TCO2 27 05/17/2020 2036   ACIDBASEDEF 0.1 05/26/2020 0335   O2SAT 96.7 05/26/2020 0335   CBG (last 3)  Recent Labs    06/03/20 1845 06/03/20 1942 06/04/20 0607  GLUCAP 137* 143* 137*    Assessment/Plan: S/P Procedure(s) (LRB): ESOPHAGOGASTRODUODENOSCOPY  (EGD); ESOPHAGEAL STENT REMOVAL. (N/A) ESOPHAGEAL STENT PLACEMENT USING A 23 MM COVERED STENT (N/A) VIDEO ASSISTED THORACOSCOPY (VATS)/EMPYEMA (Right)  1. CV- hemodynamically stable, sinus tachycardia, BP well controlled. 2. Pulm- CT removed, JP drain remains in place-no drainage recorded. CXR shows pleural effusion and atelectasis on the right side.  3. ID- patient febrile, WBC 9.9 this morning. blood cultures no growth to date. On Maxipime and Vanc for broad spectrum coverage. 4. GI- clear diet as tolerated, will be advancing diet very slowly 5. Deconditioning- once medically stable will need placement of CIR vs. SNF  Plan: Needs aggressive pulm toilet. Continue nebs. Blood cultures negative to date, will continue to follow. Continue antibiotics.    LOS: 24 days    Elgie Collard 06/04/2020   Agree with above Continue tube feeds Continue blake drain to bulb On abx, WBX down.  CT shows posterior fluid collection, but drain in postion dispo [place]

## 2020-06-04 NOTE — Progress Notes (Signed)
[facility]  PROGRESS NOTE  Evan Chavez PTW:[id number] DOB: May 11, 1961 DOA: 05/10/2020 PCP: Rosita Fire, MD Admit date - 05/10/2020   Admitting Physician Lajuana Matte, MD  Outpatient Primary MD for the patient is Rosita Fire, MD  LOS - 24 Brief Narrative   Evan Chavez is a 59 y.o. year old male with medical history significant for GERD, Barrett's esophagus, peptic ulcer disease with extensive gastric erosion, duodenal ulcerand esophageal stricture status post balloon dilatation on 12/2018, also with history of prior stroke, hyperlipidemia, BPH, remote cocaine abuse and prior CVA who presented on 05/10/2020 to [facility] with generalized weakness, productive cough and low-grade fever shortness of breath initially admitted for pneumonia based off CTA chest showing bibasilar consolidation pleural effusions treated with ceftriaxone and azithromycin. Hospital course complicated by right-sided hydropneumothorax with possible lung collapse after right thoracentesis on 8/2, esophageal perforation (CT chest on 8/4)With large right-sided hydropneumothorax, complicated by aspiration pneumonia.  Patient underwent right-sided chest tube placement was transferred from [facility] to [facility] where he underwent VATS and decortication by CT surgery as well as esophageal stent placement and PEG tube placement on 8/4-8/5.  Patient has been managed on hospitalist service since.  Did have episodes of persistent leakage from esophagus on esophagram as well as requiring right thorascopic for drainage of loculated pleural effusion on 8/13 by CT surgery,  repeat esophagram on 8/16 did not show any esophageal leak. Currently hospitalist service is following as a consult and CT surgery is primary team  Subjective  Tmax 99.7. States pain in right side at site of chest tube.  Nursing notes he is not swallowing his food or even liquids Still with lots of oral secretions A & P   Sepsis  presumed secondary to right base pneumonia, improving. Chest x-ray again notes right base atelectasis/infiltrate and CT chest shows right lobe findings with known effusion/empyema on right side. Has remained afebrile since yesterday am, leukocytosis resolved, still has tachypnea and tachycardia.   Patient is not following pulmonary toilet and tends to pocket his oral secretions, at high risk for aspiration pneumonia. CT chest confirms chest tube in appropriate position relative to effusion and effusion looks smaller in size -Monitor repeat blood cultures --Continue broad spectrum antibiotics given patient is hospitalized with vancomycin and cefepime per sepsis protocol -Monitor CBC, fever/temp trend -Continue aggressive pulmonary toilet (discussed with patient and nursing) with incentive spirometry, flutter valve, out of bed to chair  Acute hypoxic respiratory failure of multifactorial etiology (bibasilar pneumonia, loculated parapneumonic effusion/complex on right, suspect ongoing left, right apical pneumothorax). Stable respiratory status on room air.  Chest tube removed on 8/20 with no pneumothorax on repeat chest x-ray -Currently on fluconazole (8/5-for Candida Parapsilosis on pleural effusion studies (05/17/20)  Sepsis secondary to aspiration pneumonia and mediastinitis from prior esophageal rupture,  resolved.  In setting of esophageal rupture.    Esophageal rupture leading to right-sided hydropneumothorax and right-sided complex loculated parapneumonic effusion.  Cultures from empyema fluid positive for candida parapsolis (8/5)Minimal output from chest tube bulb. CT chest shows last remaining chest tube in appropriate place with slight decrease in size of right empyema -Chest tube  removed per CT surgery on 8/20, follow-up post removal chest x-ray with no pneuomthorax --continue fluconazole --Continue Mucomyst, flutter valve, chest PT-  Orophayrngeal dysphagia, in the setting of esophageal  rupture. Last speech eval on 8/2 recommended regular thin liquid diet. He does not seem to be able to swallow his on oral secretions or liquis --Have  Speech reevaluate, hold on clear liquid diet started on 8/19 by CTS -Continue tube feeds   Acute on chronic blood loss anemia, stable.  In the setting of blood loss from esophageal rupture and surgery as well as hematemesis during hospitalization.  Hemoglobin currently stable -Monitor CBC  Hypophosphatemia, resolved. Improved with IV supplementation  Impaired mobility/generalized weakness -PT recommends SNF -Social worker consulted -Monitor CBC, transfuse if hemoglobin less than 7  Intermittent delirium, resolved.  Likely hospital-acquired given prolonged course.  Currently alert and oriented to place, self, time, context no longer restless or agitated. -Delirium precautions -As needed IV Haldol has been helpful   Paroxysmal atrial fibrillation.  No longer A. fib.   --Cardiology advised discontinuing diltiazem, Continuing metoprolol 50 mg twice daily as well as DOAC when he is able to tolerate  Sinus tachycardia, mild with heart rate in the low 100s not A. fib on my review of telemetry, suspect related to dehydration due to water deficits and infection driven.  TSH within normal limits, hemoglobin stable.  Likely persistent related to ongoing infection addressed above -Monitor hydration --add IVF, consult nutrition to assist with free water flushes --continue antibiotics --Clear liquid diet, expect improvement with increase oral hydration--but not swallowing well -Continue to monitor on telemetry  Prior CVA (07/2018)prior to admission -Holding home aspirin 325 - will likely resume Lipitor on discharge, continue to monitor LFTs  Elevated LFTs, continue to downtrend Denies any abdominal pain.  Hepatitis panel unremarkable -Continue to hold home statins  Left hydronephrosis.  Noted on CT scan.  As well as 9 mm obstructing proximal left  ureteral stone  urology was consulted during hospitalization and recommended continue original plan of shockwave lithotripsy. this was deferred during current hospitalization due to respiratory status -Urology will arrange close follow-up to get this scheduled,        Family Communication  : Mother updated at bedside on 8/21  Code Status :  FULL  Disposition Plan  :  Patient is from home. Anticipated d/c date: 2 to 3 days. Barriers to d/c or necessity for inpatient status:  contine  broad-spectrum antibiotics for sepsis secondary to right lower lobe pneumonia, still needing final disposition for anticoagulation--cardiology recommending DOAC at discharge if able to tolerate, speech eval given dysphagia, IVF given sinus tachycardia likely dehydration driven   Procedures  :    DVT Prophylaxis  :  SCDs   Lab Results  Component Value Date   PLT 581 (H) 06/04/2020    Diet :  Diet Order            Diet clear liquid Room service appropriate? Yes; Fluid consistency: Thin  Diet effective now                  Inpatient Medications Scheduled Meds: . bisacodyl  10 mg Oral Daily  . feeding supplement  1 Container Oral BID BM  . feeding supplement (PROSource TF)  45 mL Per Tube BID  . insulin aspart  0-24 Units Subcutaneous TID AC & HS  . mouth rinse  15 mL Mouth Rinse q12n4p  . metoprolol tartrate  50 mg Per Tube BID  . pantoprazole (PROTONIX) IV  40 mg Intravenous Q12H  . sennosides  5 mL Per Tube QHS   Continuous Infusions: . sodium chloride 70 mL/hr at 05/16/20 2122  . sodium chloride    . sodium chloride    . sodium chloride    . ceFEPime (MAXIPIME) IV 2 g (06/04/20 1350)  . feeding supplement (  OSMOLITE 1.5 CAL) 1,000 mL (06/03/20 2345)  . fluconazole (DIFLUCAN) IV 400 mg (06/04/20 1023)  . vancomycin 1,000 mg (06/04/20 1452)   PRN Meds:.sodium chloride, Place/Maintain arterial line **AND** sodium chloride, Place/Maintain arterial line **AND** sodium chloride,  [DISCONTINUED] acetaminophen **OR** acetaminophen, fentaNYL (SUBLIMAZE) injection, hydrALAZINE, ipratropium-albuterol, metoprolol tartrate, ondansetron (ZOFRAN) IV, traMADol  Antibiotics  :   Anti-infectives (From admission, onward)   Start     Dose/Rate Route Frequency Ordered Stop   06/03/20 0200  vancomycin (VANCOCIN) IVPB 1000 mg/200 mL premix        1,000 mg 200 mL/hr over 60 Minutes Intravenous Every 12 hours 06/02/20 1248     06/02/20 2200  ceFEPIme (MAXIPIME) 2 g in sodium chloride 0.9 % 100 mL IVPB        2 g 200 mL/hr over 30 Minutes Intravenous Every 8 hours 06/02/20 1251     06/02/20 2000  piperacillin-tazobactam (ZOSYN) IVPB 3.375 g  Status:  Discontinued        3.375 g 12.5 mL/hr over 240 Minutes Intravenous Every 8 hours 06/02/20 1248 06/02/20 1249   06/02/20 1330  vancomycin (VANCOREADY) IVPB 1250 mg/250 mL        1,250 mg 166.7 mL/hr over 90 Minutes Intravenous  Once 06/02/20 1248 06/02/20 1646   06/02/20 1330  piperacillin-tazobactam (ZOSYN) IVPB 3.375 g  Status:  Discontinued        3.375 g 100 mL/hr over 30 Minutes Intravenous  Once 06/02/20 1248 06/02/20 1249   06/02/20 1215  vancomycin (VANCOCIN) IVPB 1000 mg/200 mL premix  Status:  Discontinued        1,000 mg 200 mL/hr over 60 Minutes Intravenous  Once 06/02/20 1202 06/02/20 1251   06/02/20 1215  ceFEPIme (MAXIPIME) 2 g in sodium chloride 0.9 % 100 mL IVPB        2 g 200 mL/hr over 30 Minutes Intravenous  Once 06/02/20 1202 06/02/20 1506   05/29/20 1800  vancomycin (VANCOCIN) IVPB 1000 mg/200 mL premix  Status:  Discontinued        1,000 mg 200 mL/hr over 60 Minutes Intravenous Every 12 hours 05/29/20 1151 05/30/20 0912   05/24/20 2100  vancomycin (VANCOREADY) IVPB 750 mg/150 mL  Status:  Discontinued        750 mg 150 mL/hr over 60 Minutes Intravenous Every 12 hours 05/24/20 0813 05/29/20 1151   05/24/20 0900  vancomycin (VANCOREADY) IVPB 1500 mg/300 mL        1,500 mg 150 mL/hr over 120 Minutes Intravenous   Once 05/24/20 0813 05/24/20 1221   05/17/20 0900  fluconazole (DIFLUCAN) IVPB 400 mg        400 mg 100 mL/hr over 120 Minutes Intravenous Every 24 hours 05/17/20 0749     05/17/20 0600  vancomycin (VANCOREADY) IVPB 750 mg/150 mL  Status:  Discontinued        750 mg 150 mL/hr over 60 Minutes Intravenous Every 8 hours 05/16/20 2154 05/17/20 0903   05/17/20 0400  vancomycin (VANCOREADY) IVPB 750 mg/150 mL  Status:  Discontinued        750 mg 150 mL/hr over 60 Minutes Intravenous Every 8 hours 05/16/20 1945 05/16/20 2154   05/16/20 2200  vancomycin (VANCOREADY) IVPB 1250 mg/250 mL        1,250 mg 166.7 mL/hr over 90 Minutes Intravenous STAT 05/16/20 2153 05/16/20 2348   05/16/20 2000  vancomycin (VANCOREADY) IVPB 1250 mg/250 mL  Status:  Discontinued        1,250 mg 166.7 mL/hr  over 90 Minutes Intravenous  Once 05/16/20 1945 05/16/20 2153   05/15/20 1600  piperacillin-tazobactam (ZOSYN) IVPB 3.375 g  Status:  Discontinued        3.375 g 12.5 mL/hr over 240 Minutes Intravenous Every 8 hours 05/15/20 0945 05/30/20 0912   05/15/20 0930  piperacillin-tazobactam (ZOSYN) IVPB 3.375 g        3.375 g 100 mL/hr over 30 Minutes Intravenous  Once 05/15/20 0921 05/15/20 1009   05/13/20 2000  azithromycin (ZITHROMAX) tablet 500 mg        500 mg Oral Daily 05/13/20 1155 05/14/20 2127   05/10/20 2200  cefTRIAXone (ROCEPHIN) 2 g in sodium chloride 0.9 % 100 mL IVPB  Status:  Discontinued        2 g 200 mL/hr over 30 Minutes Intravenous Every 24 hours 05/10/20 2148 05/15/20 0911   05/10/20 2200  azithromycin (ZITHROMAX) 500 mg in sodium chloride 0.9 % 250 mL IVPB  Status:  Discontinued        500 mg 250 mL/hr over 60 Minutes Intravenous Every 24 hours 05/10/20 2148 05/13/20 1155       Objective   Vitals:   06/04/20 0300 06/04/20 0500 06/04/20 0800 06/04/20 1200  BP: (!) 146/82   135/78  Pulse:   100 95  Resp: 18  (!) 28 (!) 28  Temp: 99.6 F (37.6 C)   99.7 F (37.6 C)  TempSrc: Oral   Oral    SpO2: 97%     Weight:  58.5 kg    Height:        SpO2: 97 % O2 Flow Rate (L/min): 4 L/min FiO2 (%): 21 %  Wt Readings from Last 3 Encounters:  06/04/20 58.5 kg  05/09/20 69.4 kg  12/13/19 68.2 kg     Intake/Output Summary (Last 24 hours) at 06/04/2020 1523 Last data filed at 06/04/2020 1241 Gross per 24 hour  Intake 442.61 ml  Output 925 ml  Net -482.39 ml    Physical Exam:     Awake Alert, Oriented X 3, flat affect Copious amounts of oral secretions On room air, normal respiratory effort, tachypneic, diminished breath sounds on right side, no wheezing or rhonchi, chest tube bulb with minimal drainage Abdomen soft, nondistended, PEG tube in place Tachycardic, normal rhythm,No Gallops,Rubs or new Murmurs,     I have personally reviewed the following:   Data Reviewed:  CBC Recent Labs  Lab 05/31/20 0047 06/01/20 0019 06/02/20 0038 06/03/20 0121 06/04/20 0118  WBC 14.6* 15.3* 21.2* 14.9* 9.9  HGB 9.4* 9.4* 9.4* 9.2* 9.1*  HCT 30.8* 30.8* 30.3* 30.3* 30.0*  PLT 704* 739* 688* 697* 581*  MCV 86.3 85.6 86.6 87.1 84.7  MCH 26.3 26.1 26.9 26.4 25.7*  MCHC 30.5 30.5 31.0 30.4 30.3  RDW 14.8 14.8 15.2 15.4 15.2  LYMPHSABS 0.9 0.9 1.1 1.2 0.8  MONOABS 0.8 0.8 1.2* 1.1* 0.8  EOSABS 0.1 0.0 0.1 0.1 0.0  BASOSABS 0.1 0.1 0.1 0.1 0.0    Chemistries  Recent Labs  Lab 05/31/20 0047 06/01/20 0019 06/02/20 0038 06/03/20 0121 06/04/20 0118  NA 133* 131* 134* 138 138  K 5.0 4.4 4.5 4.6 4.4  CL 100 100 103 103 103  CO2 24 22 24 22 22   GLUCOSE 140* 158* 151* 123* 138*  BUN 11 14 18 19 16   CREATININE 0.78 0.79 0.90 0.83 0.86  CALCIUM 8.8* 8.6* 8.5* 9.0 8.7*  AST 67* 57* 37 72* 96*  ALT 198* 167* 130* 152* 213*  ALKPHOS  250* 266* 262* 275* 295*  BILITOT 0.3 0.5 0.4 0.5 0.5   ------------------------------------------------------------------------------------------------------------------ No results for input(s): CHOL, HDL, LDLCALC, TRIG, CHOLHDL, LDLDIRECT  in the last 72 hours.  Lab Results  Component Value Date   HGBA1C 5.5 07/12/2018   ------------------------------------------------------------------------------------------------------------------ No results for input(s): TSH, T4TOTAL, T3FREE, THYROIDAB in the last 72 hours.  Invalid input(s): FREET3 ------------------------------------------------------------------------------------------------------------------ No results for input(s): VITAMINB12, FOLATE, FERRITIN, TIBC, IRON, RETICCTPCT in the last 72 hours.  Coagulation profile Recent Labs  Lab 06/02/20 1240 06/02/20 1524  INR 1.2 1.2    No results for input(s): DDIMER in the last 72 hours.  Cardiac Enzymes No results for input(s): CKMB, TROPONINI, MYOGLOBIN in the last 168 hours.  Invalid input(s): CK ------------------------------------------------------------------------------------------------------------------    Component Value Date/Time   BNP 68.0 05/14/2020 0604    Micro Results Recent Results (from the past 240 hour(s))  Culture, blood (routine x 2)     Status: None (Preliminary result)   Collection Time: 06/01/20 10:28 AM   Specimen: BLOOD LEFT ARM  Result Value Ref Range Status   Specimen Description BLOOD LEFT ARM  Final   Special Requests BOTTLES DRAWN AEROBIC ONLY  Final   Culture   Final    NO GROWTH 3 DAYS Performed at [facility], [address], [place], [place] [address]    Report Status PENDING  Incomplete  Culture, blood (routine x 2)     Status: None (Preliminary result)   Collection Time: 06/01/20 10:30 AM   Specimen: BLOOD LEFT HAND  Result Value Ref Range Status   Specimen Description BLOOD LEFT HAND  Final   Special Requests   Final    BOTTLES DRAWN AEROBIC ONLY Blood Culture adequate volume   Culture   Final    NO GROWTH 3 DAYS Performed at [facility], [place] [address] [place], [place] [address]    Report Status PENDING  Incomplete  Culture, blood (x 2)      Status: None (Preliminary result)   Collection Time: 06/02/20  3:18 PM   Specimen: BLOOD LEFT HAND  Result Value Ref Range Status   Specimen Description BLOOD LEFT HAND  Final   Special Requests   Final    BOTTLES DRAWN AEROBIC AND ANAEROBIC Blood Culture adequate volume   Culture   Final    NO GROWTH 2 DAYS Performed at [facility], [place] [address] [place], [place] [address]    Report Status PENDING  Incomplete  Culture, blood (x 2)     Status: None (Preliminary result)   Collection Time: 06/02/20  3:24 PM   Specimen: BLOOD RIGHT HAND  Result Value Ref Range Status   Specimen Description BLOOD RIGHT HAND  Final   Special Requests   Final    BOTTLES DRAWN AEROBIC AND ANAEROBIC Blood Culture adequate volume   Culture   Final    NO GROWTH 2 DAYS Performed at [facility], [place] [address], [place], [place] [address]    Report Status PENDING  Incomplete    Radiology Reports DG Chest 1 View  Result Date: 05/26/2020 CLINICAL DATA:  Esophageal anastomotic leak EXAM: CHEST  1 VIEW COMPARISON:  05/25/2020 FINDINGS: Right chest tubes and esophageal stent are again noted. There is new near total opacification of the right hemithorax. Right pleural effusion is present. Left lung remains clear. No pneumothorax. Cardiomediastinal contours are partially obscured. There is mild rightward mediastinal shift. IMPRESSION: New near total opacification of the right hemithorax  likely reflecting combination of effusion and atelectasis. Electronically Signed   By: Macy Mis M.D.   On: 05/26/2020 08:21   DG Chest 1 View  Result Date: 05/25/2020 CLINICAL DATA:  Esophageal stent placement. EXAM: DG C-ARM 1-60 MIN; CHEST  1 VIEW FLUOROSCOPY TIME:  Fluoro time: 4 Minutes 17 secondsRadiation exposure index: MGy: 41.69Number of acquired spot images: Five spot fluoro graphic images. COMPARISON:  May 17, 2020.  May 23, 2020 FINDINGS: Spot fluoroscopy images were obtained for surgical planning  purposes. Esophageal stent is seen traversing the thorax. Enteric contrast traverses the stent and enters the proximal stomach. None of the provided radiographs demonstrate evaluation of the site of previous leak into the RIGHT pleural space. Selective LEFT mainstem intubation. IMPRESSION: Spot fluoroscopy images from surgical planning purposes. Please reference procedure report for further details. Electronically Signed   By: Valentino Saxon MD   On: 05/25/2020 10:59   DG Chest 1 View  Result Date: 05/17/2020 CLINICAL DATA:  Surgery. Esophageal stent placement. EXAM: DG C-ARM 1-60 MIN; CHEST  1 VIEW FLUOROSCOPY TIME:  Fluoroscopy Time:  5 minutes 48 seconds Radiation Exposure Index (if provided by the fluoroscopic device): 59.06 mGy Number of Acquired Spot Images: 3 COMPARISON:  Preprocedural CT yesterday. FINDINGS: Three fluoroscopic spot views obtained in the operating room. Endoscope is in place. A stent overlies the endoscope with tip projecting over the esophageal diaphragmatic hiatus. Subsequent enteric tube courses in the stent. There is contrast opacifying the distal stent on image 1. Right chest tube is in place. IMPRESSION: Intraoperative fluoroscopy during esophageal stent placement. Please reference procedural report for details. Electronically Signed   By: Keith Rake M.D.   On: 05/17/2020 19:55   DG Chest 1 View  Result Date: 05/14/2020 CLINICAL DATA:  Complicated RIGHT pleural effusion post diagnostic thoracentesis EXAM: CHEST  1 VIEW COMPARISON:  05/10/2020 FINDINGS: Enlargement of cardiac silhouette. Mediastinal contours and pulmonary vascularity normal. Persistent RIGHT pleural effusion and significant atelectasis and/or consolidation of the mid to lower RIGHT lung. New lucency at the RIGHT apex, suspect representing apical pneumothorax. No mediastinal shift. Patient asymptomatic. Persistent infiltrate LEFT lower lobe and tiny LEFT pleural effusion. IMPRESSION: Probable RIGHT apical  pneumothorax post diagnostic thoracentesis. Persistent complicated partially loculated RIGHT pleural effusion, RIGHT basilar atelectasis versus consolidation, and LEFT lower lobe consolidation. Patient asymptomatic. Findings discussed with Dr. Elsworth Soho on 05/14/2020 at 1545 hours. Electronically Signed   By: Lavonia Dana M.D.   On: 05/14/2020 15:51   CT CHEST WO CONTRAST  Result Date: 05/16/2020 CLINICAL DATA:  Pleural effusion. EXAM: CT CHEST WITHOUT CONTRAST TECHNIQUE: Multidetector CT imaging of the chest was performed following the standard protocol without IV contrast. COMPARISON:  CT dated May 10, 2020 FINDINGS: Cardiovascular: The heart size is stable. There is some shift of the mediastinum to the left. There is a trace pericardial effusion. Mild atherosclerotic changes are noted of the thoracic aorta. Mediastinum/Nodes: --mild mediastinal adenopathy is noted -- there is mild bilateral hilar adenopathy. -- No axillary lymphadenopathy. -- No supraclavicular lymphadenopathy. --there is a left-sided thyroid nodule measuring approximately 2.4 cm. -the esophagus is diffusely thickened. There appears to be a defect in the right lateral aspect of the mid to distal esophagus (axial series 3, image [id number]). Adjacent to this defect is extraluminal oral contrast tracking into the right pleural space. Lungs/Pleura: There is been interval placement of a right-sided chest tube. There is a large right-sided hydropneumothorax which has increased in size since the patient's prior study despite placement of  a right-sided chest tube. Additionally. The pleural effusion component appears more complex than prior study. There is new extensive debris within bronchus intermedius as well as the right lower lobe and right middle lobe bronchi consistent with significant interval aspiration. The right middle lobe and right lower lobe are essentially collapsed. There are hypoattenuating components within the right middle and right lower  lobes concerning for developing aspiration pneumonia. There are new areas of consolidation involving the left lower and left upper lobes concerning for aspiration. There is a small to moderate-sized left-sided pleural effusion. The chest tube is kinked at its distal most aspect. Upper Abdomen: There is no definite acute abnormality in the upper abdomen. Musculoskeletal: No chest wall abnormality. No bony spinal canal stenosis. IMPRESSION: 1. Overall findings consistent with esophageal perforation arising from the right lateral aspect of the mid to distal esophagus as detailed above. There is extraluminal oral contrast tracking from this esophageal wall defect into the right pleural space. 2. Large right-sided hydropneumothorax with significant interval worsening since the prior study despite right-sided chest tube placement. Additionally, the fluid component appears more complex. The right-sided chest tube appears to enter the thoracic cavity inferiorly and courses along the right hemidiaphragm and terminates in the region of the right epicardial fat pad. The tube is kinked at its tip. While the tube is likely intrathoracic, it will need significant repositioning to be effective. 3. Findings consistent with interval aspiration with complete occlusion of bronchus intermedius as well as the right middle and right lower lobe bronchi. There is likely developing aspiration pneumonia within the collapsed right middle and right lower lobes as well as the posterior left upper and left lower lobes. There is a small left-sided pleural effusion. 4. Persistent diffuse esophageal wall thickening of unknown clinical significance. Correlation with endoscopy is recommended as an underlying mass is not excluded. 5. Again noted is a left-sided thyroid nodule measuring approximately 2.4 cm. Outpatient thyroid ultrasound follow-up is recommended.(Ref: J Am Coll Radiol. 2015 Feb;12(2): 143-50). These results were called by telephone at  the time of interpretation on 05/16/2020 at 6:04 pm to provider [facility] , who verbally acknowledged these results. Electronically Signed   By: Constance Holster M.D.   On: 05/16/2020 18:04   CT CHEST W CONTRAST  Result Date: 06/02/2020 CLINICAL DATA:  59 year old male with recent esophageal perforation with esophageal stent placement and VATS for RIGHT empyema. EXAM: CT CHEST WITH CONTRAST TECHNIQUE: Multidetector CT imaging of the chest was performed during intravenous contrast administration. CONTRAST:  59mL OMNIPAQUE IOHEXOL 300 MG/ML  SOLN COMPARISON:  05/24/2020 CT FINDINGS: Cardiovascular: UPPER limits normal heart size again noted. Heavy coronary artery calcifications are again identified. There is no evidence of thoracic aortic aneurysm or pericardial effusion. Mediastinum/Nodes: Esophageal stent is again noted. A small amount of paraesophageal gas is again noted and connects to the posteromedial loculated RIGHT pleural collection which measures 2.8 x 7.7 cm in greatest diameter, previously 4.2 x 7.7 cm. No new findings are noted. Mildly prominent mediastinal and RIGHT hilar lymph nodes are likely reactive. Lungs/Pleura: A RIGHT thoracostomy tube courses through the 2.8 x 7.7 cm posteromedial loculated RIGHT pleural collection with tip located 8 cm SUPERIOR to the collection. A small RIGHT pleural effusion is noted. Moderate RIGHT LOWER lung atelectasis is present. There is no evidence of pneumothorax. Upper Abdomen: Probable moderate LEFT hydronephrosis again noted. Musculoskeletal: No acute or suspicious bony abnormalities are present. IMPRESSION: 1. Slightly decreased size of posteromedial RIGHT pleural collection/empyema from 05/24/2020. The RIGHT  thoracostomy tube courses through this collection with tip located 8 cm superiorly. 2. Small RIGHT pleural effusion and moderate RIGHT LOWER lung atelectasis. 3. Esophageal stent again noted. 4. Probable moderate LEFT hydronephrosis. Consider further  evaluation with ultrasound or CT as clinically indicated. 5. Coronary artery disease andaortic Atherosclerosis (ICD10-I70.0). Electronically Signed   By: Margarette Canada M.D.   On: 06/02/2020 14:41   CT CHEST W CONTRAST  Result Date: 05/24/2020 CLINICAL DATA:  Esophageal perforation, progressive leukocytosis, abnormal chest x-ray, dyspnea EXAM: CT CHEST WITH CONTRAST TECHNIQUE: Multidetector CT imaging of the chest was performed during intravenous contrast administration. CONTRAST:  65mL OMNIPAQUE IOHEXOL 350 MG/ML SOLN COMPARISON:  Chest radiograph 05/22/2020, CT 05/10/2020 FINDINGS: Cardiovascular: Extensive coronary artery calcification. Global cardiac size within normal limits. Trace pericardial fluid is likely physiologic in is stable since prior examination. Thoracic aorta is age-appropriate. Mediastinum/Nodes: And endoluminal stent graft has been placed within the esophagus extending from the level of the sternal notch within the proximal esophagus through the mid to distal esophagus at the level of the left atrium. The stent graft is not completely appose the esophageal lumen proximally, best seen on axial image # 27/3. Additionally, there is a defect involving the right posterolateral aspect of the esophagus, best seen on axial image # 77/3, with an adjacent loculated collection of gas, fluid, and extravasated contrast measuring 4.2 x 7.6 x 8.4 cm on axial image # 82 and sagittal image # 77. The distal margin of the stent graft does appear to completely apposed the esophageal lumen, though this is extremely close to the point of perforation. The distal esophagus again demonstrates marked circumferential wall thickening in keeping with esophagitis. Numerous shotty enhancing right paratracheal lymph nodes are identified, likely reactive. No pathologic thoracic adenopathy. Largely cystic nodule again identified within the left thyroid lobe, unchanged. Lungs/Pleura: Right chest tube is in place extending  posteriorly and apically. There is collapse and consolidation of the right middle and lower lobes. There is partially loculated gas and fluid within the right pleural space laterally and within the major inter lobar fissure. The pleural fluid component is relatively small and communicates with the chest tube and has significantly decreased in volumes since prior examination, both in its free-flowing component as well as the a loculated component within the inter lobar fissure. The previously noted extravasated collection, however, does appear isolated from the chest tube. Bronchial wall thickening within the aerated right upper lobe is in keeping with airway inflammation. The central airways of the right lower lobe appear impacted and fluid filled. Minimal infiltrate is seen within the basilar lingula and left lower lobe, infectious or inflammatory in nature. No central obstructing mass. No pneumothorax or pleural effusion on the left. Upper Abdomen: Left hydronephrosis is partially visualized, incompletely assessed on this examination. This appears similar to that noted on prior examination of 05/13/2020. Musculoskeletal: No acute bone abnormality. IMPRESSION: Interval placement of right chest tube with significant evacuation loculated right pleural fluid. A small free-flowing fluid in gas component remains as well as a small partially loculated component within the inter lobar fissure. Interval placement of an and luminal stent graft within the esophagus with incomplete apposition of the graft proximally likely resulting in contrast coursing between the stent graft and the esophageal lumen to the point of leakage noted on prior esophagram. Pilar Plate perforation of the right posterolateral mid to distal esophagus again noted. Associated loculated extravasated contrast and fluid collection within the right pleural space appears isolated from the adjacent right chest  tube. Collapse and consolidation of the right middle  and right lower lobe with extensive fluid opacification of the right lower lobe airways. Airway inflammation involving the right upper lobe and probable minimal infectious or inflammatory infiltrate within the left lung base. Left hydronephrosis, similar to that noted on prior examination, incompletely evaluated on this examination. Electronically Signed   By: Fidela Salisbury MD   On: 05/24/2020 15:21   CT Angio Chest PE W and/or Wo Contrast  Result Date: 05/11/2020 CLINICAL DATA:  Shortness of breath and weakness for several days EXAM: CT ANGIOGRAPHY CHEST WITH CONTRAST TECHNIQUE: Multidetector CT imaging of the chest was performed using the standard protocol during bolus administration of intravenous contrast. Multiplanar CT image reconstructions and MIPs were obtained to evaluate the vascular anatomy. CONTRAST:  22mL OMNIPAQUE IOHEXOL 350 MG/ML SOLN COMPARISON:  Chest x-ray from earlier in the same day, CTA from 07/12/2018. FINDINGS: Cardiovascular: Thoracic aorta demonstrates atherosclerotic calcifications. No aneurysmal dilatation or dissection is noted. No cardiac enlargement is seen. Coronary calcifications are noted. The pulmonary artery shows a normal branching pattern. No definitive filling defect is identified to suggest pulmonary embolism. Mediastinum/Nodes: Thoracic inlet shows a hypodensity within the left lobe of the thyroid measuring 17 mm in greatest dimension. This is relatively stable from a prior CT examination from 2019. No sizable hilar or mediastinal adenopathy is noted. The esophagus demonstrates some diffuse wall thickening which may be related to reflux esophagitis. Lungs/Pleura: Bilateral moderate pleural effusions are seen. Associated lower lobe consolidation is noted. No sizable parenchymal nodules are seen. No pneumothorax is noted. Upper Abdomen: Visualized upper abdomen shows geographic decreased attenuation within the left kidney and enlargement of the left kidney. This may simply  be related to hydronephrosis. The possibility of an infiltrating mass deserves consideration. This can be further evaluated with CT of the abdomen and pelvis with contrast. Musculoskeletal: No acute bony abnormality is noted. Review of the MIP images confirms the above findings. IMPRESSION: No evidence of pulmonary emboli. Bilateral pleural effusions and lower lobe consolidation similar to that seen on recent chest x-ray. This is consistent with bibasilar pneumonia and reactive effusions. Diffuse wall thickening throughout the esophagus likely related to reflux esophagitis. Clinical correlation is recommended. It should be noted this is stable from a prior exam of 2019. Enlargement of the left kidney with decreased attenuation identified. Although this may simply represent hydronephrosis possibility of an underlying infiltrative mass deserves consideration. CT of the abdomen and pelvis with contrast is recommended. 17 mm left thyroid nodule. This is roughly stable from the prior CT examination from 2019 and likely benign. Recommend thyroid US (ref: J Am Coll Radiol. 2015 Feb;12(2): 143-50). Electronically Signed   By: Inez Catalina M.D.   On: 05/11/2020 00:00   CT ABDOMEN PELVIS W CONTRAST  Result Date: 05/13/2020 CLINICAL DATA:  LEFT renal mass. Suspicion for renal mass on prior chest CT. EXAM: CT ABDOMEN AND PELVIS WITH CONTRAST TECHNIQUE: Multidetector CT imaging of the abdomen and pelvis was performed using the standard protocol following bolus administration of intravenous contrast. CONTRAST:  183mL OMNIPAQUE IOHEXOL 300 MG/ML  SOLN COMPARISON:  Chest CT of May 10, 2020 FINDINGS: Lower chest: Loculated bilateral basilar effusions with similar appearance when compared to the study of May 10, 2020. Basilar airspace disease with increasing airspace disease particularly at the LEFT lung base. Hiatal hernia and patulous distal esophagus. Calcified coronary artery disease partially imaged. No pericardial  effusion. Hepatobiliary: Liver without focal, suspicious lesion. Portal vein is patent. No pericholecystic  stranding. No biliary duct dilation. Pancreas: Pancreas is normal without peripancreatic stranding, ductal dilation or inflammation. Spleen: Spleen normal in size and contour. Adrenals/Urinary Tract: Adrenal glands are normal. Moderate to marked LEFT-sided hydroureteronephrosis with overlying cortical thinning. Large calculus in the proximal LEFT ureter measuring 9 x 8 x 8 mm. No perinephric abscess. Urinary bladder is normal. Normal enhancement of the RIGHT kidney. Enhancement of the kidneys despite scarring and dilation is relatively symmetric and there is an additional small calculus to moderate sized calculus in the lower pole the LEFT kidney. Area with limited assessment due to motion artifact. Stomach/Bowel: Hiatal hernia and some distal esophageal thickening as described. Small duodenal diverticulum in the second portion of the duodenum. No acute gastrointestinal process. Appendix not visualized. Motion artifact in this area limiting assessment but no gross findings of inflammation that would indicate pericecal stranding or secondary signs of acute appendicitis. Colonic interposition along the anterior portion of the liver. No pericolonic stranding. No perirectal stranding. Vascular/Lymphatic: Calcific and noncalcific atheromatous plaque of the abdominal aorta. No aneurysmal dilation. No adenopathy in the retroperitoneum or upper abdomen. No pelvic lymphadenopathy. Reproductive: Heterogeneous prostate. Calcifications of the vas deferens. Other: No ascites. Musculoskeletal: No acute or destructive bone finding. Spinal degenerative changes. IMPRESSION: 1. Moderate to marked hydronephrosis due to calculus in the proximal to mid LEFT ureter. Urologic consultation and correlation with urine studies to exclude concomitant infection is suggested. 2. Cortical thinning that is seen on the LEFT and preservation of  renal enhancement suggests that an element of this process could potentially be chronic. 3. Loculated effusions and worsening of LEFT lower lobe airspace disease that may represent a combination of volume loss and pneumonitis 4. Loculated bilateral basilar effusions with similar appearance when compared to the study of May 10, 2020. Basilar airspace disease with increasing airspace disease particularly at the LEFT lung base. Findings may represent worsening pneumonia or aspiration. 5. Hiatal hernia and patulous distal esophagus. 6. Aortic atherosclerosis. Aortic Atherosclerosis (ICD10-I70.0). Electronically Signed   By: Zetta Bills M.D.   On: 05/13/2020 12:52   DG CHEST PORT 1 VIEW  Result Date: 06/04/2020 CLINICAL DATA:  Pneumonia EXAM: PORTABLE CHEST 1 VIEW COMPARISON:  Chest radiograph and chest CT June 02, 2020 FINDINGS: Esophageal stent again noted, unchanged in position. Chest tube present on the right. No evident pneumothorax. There is a small right pleural effusion with airspace opacity in the right mid and lower lung regions. Left lung is clear. Heart is borderline enlarged with pulmonary vascularity normal. No adenopathy. No bone lesions. IMPRESSION: Persistent small right pleural effusion with airspace opacity, likely combination of atelectasis and pneumonia, in the right mid and lower lung regions. Left lung clear. Stable cardiac silhouette. Stable positioning of esophageal stent. Right chest tube present without evident pneumothorax. Electronically Signed   By: Lowella Grip III M.D.   On: 06/04/2020 08:13   DG CHEST PORT 1 VIEW  Result Date: 06/02/2020 CLINICAL DATA:  Tachypnea EXAM: PORTABLE CHEST 1 VIEW COMPARISON:  June 01, 2020 FINDINGS: An esophageal stent is again identified. Stable right chest tube. No pneumothorax. Opacity and effusion remain in the right base. No other interval changes. IMPRESSION: 1. Small right effusion underlying opacity. The opacity could represent  atelectasis or infiltrate. No other changes. Electronically Signed   By: Dorise Bullion III M.D   On: 06/02/2020 12:41   DG CHEST PORT 1 VIEW  Result Date: 06/01/2020 CLINICAL DATA:  Chest tube. Shortness of breath. EXAM: PORTABLE CHEST 1 VIEW COMPARISON:  May 29, 2020 FINDINGS: Since the prior study the right apical chest tube seen on the prior study has been removed. The additional right-sided chest tube seen on the prior exam is stable in position. Persistent moderate to marked severity right basilar atelectasis and/or infiltrate is noted. There is a small right pleural effusion. A small amount of right apical pleural fluid is also suspected. No residual pneumothorax is identified. The cardiac silhouette is mildly enlarged. A radiopaque stent is seen overlying the mid and distal esophagus. The visualized skeletal structures are unremarkable. IMPRESSION: 1. Interval right apical chest tube removal without evidence of a residual pneumothorax. 2. Persistent moderate to marked severity right basilar atelectasis and/or infiltrate. 3. Small right pleural effusion. Electronically Signed   By: Virgina Norfolk M.D.   On: 06/01/2020 16:11   DG Chest Port 1 View  Result Date: 05/29/2020 CLINICAL DATA:  Chest tube.  Shortness of breath. EXAM: PORTABLE CHEST 1 VIEW COMPARISON:  05/28/2020. FINDINGS: Two right chest tubes in stable position. Esophageal stent in stable position. Stable cardiomegaly. Progressive dense right base atelectasis/infiltrate. Persistent right mid lung field infiltrate. Small right pleural effusion, improved from prior exam. No pneumothorax. IMPRESSION: 1. Two right chest tubes in stable position. Esophageal stent stable position. No pneumothorax. 2.  Stable cardiomegaly. 3. Progressive dense right base atelectasis/infiltrate. Persistent right mid lung field infiltrate. 3.  Small right pleural effusion, improved from prior exam. Electronically Signed   By: Marcello Moores  Register   On: 05/29/2020  07:38   DG Chest Port 1 View  Result Date: 05/28/2020 CLINICAL DATA:  Pleural effusion, chest tube EXAM: PORTABLE CHEST 1 VIEW COMPARISON:  05/27/2020 FINDINGS: There are two right-sided chest tubes. Esophageal stent is again noted. Improved aeration of the right lung with persistent but decreased right pleural effusion. No pneumothorax. Cardiomediastinal contours are partially obscured IMPRESSION: Improved aeration of right lung since 05/27/2020 with persistent but decreased right pleural effusion. Electronically Signed   By: Macy Mis M.D.   On: 05/28/2020 09:53   DG Chest Port 1 View  Result Date: 05/27/2020 CLINICAL DATA:  Shortness of breath EXAM: PORTABLE CHEST 1 VIEW COMPARISON:  05/26/2020 FINDINGS: Cardiac shadow is stable. Esophageal stent is again noted and stable. Two right-sided chest tubes are seen in satisfactory position. The overall appearance is stable. Persistent opacification of the right hemithorax is again noted similar to that seen on recent plain film evaluation. No new focal abnormality is noted. IMPRESSION: Stable appearance of the chest with consolidation in the right hemithorax. Electronically Signed   By: Inez Catalina M.D.   On: 05/27/2020 07:22   DG Chest Port 1 View  Result Date: 05/25/2020 CLINICAL DATA:  Leak EXAM: PORTABLE CHEST 1 VIEW COMPARISON:  May 23, 2020 FINDINGS: The cardiomediastinal silhouette is unchanged in contour.RIGHT-sided chest tubes. Moderate RIGHT pleural effusion. Esophageal stent projects over the mediastinum inferiorly. No pneumothorax. Persistent RIGHT-sided heterogeneous opacities are not significantly changed in comparison to prior. Minimal opacities of the LEFT lung. No acute osseous abnormality. IMPRESSION: Esophageal stent projects over the inferior mediastinum. Electronically Signed   By: Valentino Saxon MD   On: 05/25/2020 11:57   DG CHEST PORT 1 VIEW  Result Date: 05/22/2020 CLINICAL DATA:  59 year old male with perforated  esophagus status post surgical exploration, esophageal stent placement and percutaneous gastrostomy tube placement postoperative day 5. Shortness of breath. EXAM: PORTABLE CHEST 1 VIEW COMPARISON:  Portable chest 0535 hours today and earlier. FINDINGS: Portable AP semi upright view at 2330 hours. Stable mediastinal contours.  Esophageal stent configuration appears stable since 05/21/2020. But there is increasing confluent right lung base opacity. A single right right chest tube remains in place. No pneumothorax identified. The left lung remains clear allowing for portable technique. Visualized tracheal air column is within normal limits. IMPRESSION: 1. Increasing right lung base opacity since yesterday, indeterminate for pleural fluid versus consolidation. 2. Single right chest tube remains in place, no pneumothorax. 3. Esophageal stent configuration appears stable. Electronically Signed   By: Genevie Ann M.D.   On: 05/22/2020 23:39   DG CHEST PORT 1 VIEW  Result Date: 05/22/2020 CLINICAL DATA:  Follow-up pneumothorax EXAM: PORTABLE CHEST 1 VIEW COMPARISON:  05/21/2020 FINDINGS: Cardiac shadow is stable. Esophageal stent is again noted and stable. Right thoracostomy catheter is seen without evidence of pneumothorax. Right basilar atelectasis and small effusion are seen. The left lung is clear. IMPRESSION: No pneumothorax is noted. Small right pleural effusion and basilar atelectasis. Electronically Signed   By: Inez Catalina M.D.   On: 05/22/2020 08:03   DG Chest Port 1 View  Result Date: 05/21/2020 CLINICAL DATA:  Postop from repair of esophageal rupture. EXAM: PORTABLE CHEST 1 VIEW COMPARISON:  05/20/2020 FINDINGS: An endoluminal stent is seen within the thoracic esophagus. 2 right-sided chest tubes remain in place. Small approximately 10% right pneumothorax is noted, which appears slightly increased in size since previous study. Bibasilar atelectasis is again seen, right side greater than left, without  significant change. Mild cardiomegaly remains stable. IMPRESSION: Small approximately 10% right pneumothorax, slightly increased in size since prior study. Stable bibasilar atelectasis, right side greater than left. Electronically Signed   By: Marlaine Hind M.D.   On: 05/21/2020 08:07   DG Chest Port 1 View  Result Date: 05/20/2020 CLINICAL DATA:  Shortness of breath. RIGHT-sided chest tube present. EXAM: PORTABLE CHEST 1 VIEW COMPARISON:  05/19/2020 FINDINGS: Patient has dual RIGHT-sided chest tubes, unchanged in position. There is no pneumothorax. There is persistent RIGHT pleural effusion and significant opacity at the RIGHT lung base which partially obscures the hemidiaphragm. Subsegmental atelectasis at the LEFT lung base. Stable appearance of esophageal stent. IMPRESSION: 1. Stable appearance of the chest. 2. No pneumothorax. 3. Persistent RIGHT basilar opacity and pleural effusion. Electronically Signed   By: Nolon Nations M.D.   On: 05/20/2020 09:51   DG CHEST PORT 1 VIEW  Result Date: 05/19/2020 CLINICAL DATA:  Empyema EXAM: PORTABLE CHEST 1 VIEW COMPARISON:  05/18/2020 FINDINGS: Two right apical chest tubes.  No pneumothorax is seen. Patchy right mid/lower lung opacities, reflecting a combination of pneumonia and known complex right pleural fluid collection/empyema. Mild patchy opacity in the left mid/lower lung, likely reflecting pneumonia. Stable esophageal stent. Cardiomegaly. IMPRESSION: Two right apical chest tubes.  No pneumothorax is seen. Multifocal pneumonia, grossly unchanged. Superimposed complex right pleural fluid collection/empyema. Esophageal stent. Electronically Signed   By: Julian Hy M.D.   On: 05/19/2020 07:12   DG CHEST PORT 1 VIEW  Result Date: 05/18/2020 CLINICAL DATA:  Esophageal perforation. EXAM: PORTABLE CHEST 1 VIEW COMPARISON:  05/17/2020. FINDINGS: Esophageal stent stable position. Two right chest tubes in stable position. Stable cardiomegaly. No pulmonary  venous congestion. Low lung volumes with bibasilar atelectasis. Bilateral pulmonary infiltrates/edema again noted. Interim slight increase in right-sided pleural effusion. No pneumothorax noted on today's exam. IMPRESSION: 1. Esophageal stent and 2 right chest tubes noted in stable position. No pneumothorax noted on today's exam. Interim slight increase in right pleural effusion. 2. Low lung volumes with bibasilar atelectasis. Persistent bilateral pulmonary  infiltrates/edema without interim change. 3.  Stable cardiomegaly.  No pulmonary venous congestion Electronically Signed   By: Marcello Moores  Register   On: 05/18/2020 05:41   DG Chest Port 1 View  Result Date: 05/17/2020 CLINICAL DATA:  Esophageal stent placement. EXAM: PORTABLE CHEST 1 VIEW COMPARISON:  Chest CT yesterday. FINDINGS: Interval placement of esophageal stent. Two right chest tubes in place. Decreased right hydropneumothorax. Persistent pleural fluid in extrapleural air at the right lung base. Patchy opacity in the right mid lower lung zone. Small left pleural effusion and basilar opacities. Heart size grossly stable. There is contrast within the stomach. IMPRESSION: 1. Interval placement of esophageal stent. 2. Two right chest tubes in place with decreased right hydropneumothorax. Small volume of residual pleural fluid in extrapleural air at the right lung base. 3. Patchy opacity in the right mid and lower lung zone, may represent atelectasis or re-expansion pulmonary edema. 4. Small left pleural effusion and basilar opacities. Electronically Signed   By: Keith Rake M.D.   On: 05/17/2020 19:57   DG CHEST PORT 1 VIEW  Result Date: 05/16/2020 CLINICAL DATA:  Chest tube insertion for pneumothorax. EXAM: PORTABLE CHEST 1 VIEW COMPARISON:  May 16, 2020 FINDINGS: A right-sided chest tube is noted. Its distal end is kinked and is seen overlying the lateral aspect of the right upper quadrant. A large, stable right-sided hydropneumothorax is seen  with subsequent collapse of the right lung. This is unchanged in appearance when compared to the prior study. Stable mild to moderate severity atelectasis and/or infiltrate is noted within the left lung base. The heart size and mediastinal contours are within normal limits. The visualized skeletal structures are unremarkable. IMPRESSION: 1. Stable large right-sided hydropneumothorax with subsequent collapse of the right lung. 2. Stable mild to moderate severity left basilar atelectasis and/or infiltrate. 3. Right-sided chest tube with its distal end kinked and seen overlying the right upper quadrant. Electronically Signed   By: Virgina Norfolk M.D.   On: 05/16/2020 16:52   DG CHEST PORT 1 VIEW  Result Date: 05/16/2020 CLINICAL DATA:  Pneumothorax post recent thoracentesis, shortness of breath EXAM: PORTABLE CHEST 1 VIEW COMPARISON:  Portable exam 0849 hours compared to 05/14/2020 FINDINGS: Stable heart size and mediastinal contours. RIGHT hydropneumothorax, with increased collapse of RIGHT lung since previous exam. Minimal LEFT pleural effusion with atelectasis versus infiltrate LEFT lower lobe. LEFT upper lung clear. Bones demineralized. IMPRESSION: RIGHT hydropneumothorax with increased collapse of RIGHT lung since previous exam. Minimal LEFT pleural effusion with atelectasis versus infiltrate at LEFT lower lobe. Findings called to Dr. Elsworth Soho on 05/16/2020 at 0922 hours. Electronically Signed   By: Lavonia Dana M.D.   On: 05/16/2020 09:38   DG Chest Port 1 View  Result Date: 05/10/2020 CLINICAL DATA:  Questionable sepsis.  Pneumonia diagnosis yesterday. EXAM: PORTABLE CHEST 1 VIEW COMPARISON:  Radiograph yesterday. FINDINGS: Upper normal heart size. Unchanged mediastinal contours. Hazy symmetric bibasilar opacities are unchanged from radiograph yesterday. No pulmonary edema. No pneumothorax. IMPRESSION: Unchanged symmetric bibasilar opacities are unchanged from yesterday, likely combination of pleural  effusions and atelectasis/pneumonia. Electronically Signed   By: Keith Rake M.D.   On: 05/10/2020 21:24   DG Chest Port 1 View  Result Date: 05/09/2020 CLINICAL DATA:  Cough and fever. EXAM: PORTABLE CHEST 1 VIEW COMPARISON:  09/06/2018 FINDINGS: Heart size is normal. There are bilateral pleural effusions and infiltrate/atelectasis in both lower lungs, consistent with pneumonia. Upper lungs are clear. IMPRESSION: Bilateral lower lobe atelectasis and pneumonia.  Small effusions. Electronically  Signed   By: Nelson Chimes M.D.   On: 05/09/2020 14:16   DG Chest Port 1V same Day  Result Date: 05/14/2020 CLINICAL DATA:  Follow-up right-sided pneumothorax. EXAM: PORTABLE CHEST 1 VIEW COMPARISON:  May 14, 2020 (3:02 p.m.) FINDINGS: Stable moderate to marked severity atelectasis and/or infiltrate is noted within the right lung base with stable mild to moderate severity left basilar atelectasis and/or infiltrate seen. There is a small, stable left pleural effusion with a stable moderate sized right pleural effusion. A stable moderate sized right apical pneumothorax is seen. A mild amount of radiopaque contrast is again seen along the infrahilar region on the right. The heart size and mediastinal contours are within normal limits. The visualized skeletal structures are unremarkable. IMPRESSION: No significant interval change when compared to the prior study dated May 14, 2020 (3:02 p.m.) Electronically Signed   By: Virgina Norfolk M.D.   On: 05/14/2020 19:27   DG Swallowing Func-Speech Pathology  Result Date: 05/14/2020 Objective Swallowing Evaluation: Type of Study: MBS-Modified Barium Swallow Study  Patient Details Name: Revin Corker MRN: [id number] Date of Birth: Jan 25, 1961 Today's Date: 05/14/2020 Time: SLP Start Time (ACUTE ONLY): 86 -SLP Stop Time (ACUTE ONLY): 1300 SLP Time Calculation (min) (ACUTE ONLY): 30 min Past Medical History: Past Medical History: Diagnosis Date . Cocaine abuse ([place])  .  ETOH abuse  . GERD (gastroesophageal reflux disease)  . Hyperlipidemia  . 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 BIOPSY  08/25/2018  Procedure: BIOPSY;  Surgeon: [name], MD;  [facility];  Service: Endoscopy;; . BIOPSY  12/13/2018  Procedure: BIOPSY;  Surgeon: [name], MD;  Location: [facility];  Service: Endoscopy;;  esophagus . COLONOSCOPY WITH PROPOFOL N/A 04/07/2019  Dr. [name]: 3 Tubular adenomas removed (4 to 8 mm in size), next colonoscopy 3 years. . ESOPHAGOGASTRODUODENOSCOPY (EGD) WITH PROPOFOL N/A 08/25/2018  Dr. [name]: Severely inflamed abnormal appearing mid/distal esophagus.  Encroachment on lumen suspicious for infiltrating neoplasm located be all from severe benign inflammation, extensive gastric erosions and extensive duodenal ulcerations.  Query ischemic process versus other.  Biopsy revealed Barrett's esophagus but negative for malignancy and H. pylori. . ESOPHAGOGASTRODUODENOSCOPY (EGD) WITH PROPOFOL N/A 12/13/2018  Dr. [name]: peptic stricture s/p balloon dilatation. abnormal esophageal mucosa, by c/w barrett's without dysplasia, previous PUD completely healed.  Marland Kitchen POLYPECTOMY  04/07/2019  Procedure: POLYPECTOMY;  Surgeon: [name], MD;  Location: [facility];  Service: Endoscopy;;  colon HPI: 59 y.o. male with medical history significant for prior stroke, hyperlipidemia, GERD, BPH, barrett's esophagus    and remote history of cocaine abuse who presents to the emergency department due to 5 days of weakness, complains of productive cough with clear phlegm with occasional black speckles with associated reproducible midsternal chest pain. Patient was seen in the ED 2 days ago (7/28) due to similar symptoms, during which he reported a max temperature of 100F.  Chest x-ray done at that time showed pneumonia and patient was treated with IV fluid.  Amoxicillin and doxycycline were  prescribed on the was advised to follow-up with his primary care physician.  Patient returned to the ED yesterday due to increased heart rate and sob.  He states that he was taking his blood pressure medication as prescribed and denies cocaine use. Bilateral pleural effusions and lower lobe consolidation similar to that seen on recent chest x-ray. This is consistent with bibasilar pneumonia and reactive  effusions. Diffuse wall thickening throughout the esophagus likely related to reflux esophagitis. Clinical correlation is recommended. It should be noted this is stable from a prior exam of 2019.  Subjective: "My chest hurts when I am coughing sometimes." Assessment / Plan / Recommendation CHL IP CLINICAL IMPRESSIONS 05/14/2020 Clinical Impression Pt presents with mild oropharyngeal phase dysphagia characterized by lingual pumping with reduced bolus cohesiveness and premature spillage of liquids to the valleculae. Hyolaryngeal excursion appeared WNL. Pt without penetration/aspiration across consistencies/textures and only with trace amounts of thin liquid residuals along base of tongue and in lateral channels which cleared with secondary swallow. Pt is currently on 4L O2 via nasal cannula. Risk for aspiration with po intake appears minimal, however his respiratory status slightly increases his risk. Pt also with history of esophageal dysphagia. Recommend regular textures and thin liquids with standard aspiration and reflux precautions. No further SLP services indicated at this time. This study was reviewed with Dr. Elsworth Soho (pulmonary).  SLP Visit Diagnosis Dysphagia, oropharyngeal phase (R13.12) Attention and concentration deficit following -- Frontal lobe and executive function deficit following -- Impact on safety and function Mild aspiration risk   CHL IP TREATMENT RECOMMENDATION 05/14/2020 Treatment Recommendations No treatment recommended at this time   Prognosis 05/14/2020 Prognosis for Safe Diet Advancement Good  Barriers to Reach Goals -- Barriers/Prognosis Comment -- CHL IP DIET RECOMMENDATION 05/14/2020 SLP Diet Recommendations Regular solids;Thin liquid Liquid Administration via Cup;Straw Medication Administration Whole meds with liquid Compensations Slow rate;Small sips/bites Postural Changes Seated upright at 90 degrees;Remain semi-upright after after feeds/meals (Comment)   CHL IP OTHER RECOMMENDATIONS 05/14/2020 Recommended Consults -- Oral Care Recommendations Oral care BID Other Recommendations Clarify dietary restrictions   CHL IP FOLLOW UP RECOMMENDATIONS 05/14/2020 Follow up Recommendations None   CHL IP FREQUENCY AND DURATION 05/14/2020 Speech Therapy Frequency (ACUTE ONLY) min 2x/week Treatment Duration 1 week      CHL IP ORAL PHASE 05/14/2020 Oral Phase Impaired Oral - Pudding Teaspoon -- Oral - Pudding Cup -- Oral - Honey Teaspoon -- Oral - Honey Cup -- Oral - Nectar Teaspoon -- Oral - Nectar Cup -- Oral - Nectar Straw -- Oral - Thin Teaspoon Premature spillage Oral - Thin Cup Premature spillage;Decreased bolus cohesion Oral - Thin Straw Premature spillage Oral - Puree Lingual pumping Oral - Mech Soft -- Oral - Regular Delayed oral transit Oral - Multi-Consistency -- Oral - Pill -- Oral Phase - Comment --  CHL IP PHARYNGEAL PHASE 05/14/2020 Pharyngeal Phase Impaired Pharyngeal- Pudding Teaspoon -- Pharyngeal -- Pharyngeal- Pudding Cup -- Pharyngeal -- Pharyngeal- Honey Teaspoon -- Pharyngeal -- Pharyngeal- Honey Cup -- Pharyngeal -- Pharyngeal- Nectar Teaspoon -- Pharyngeal -- Pharyngeal- Nectar Cup -- Pharyngeal -- Pharyngeal- Nectar Straw -- Pharyngeal -- Pharyngeal- Thin Teaspoon Delayed swallow initiation-vallecula Pharyngeal -- Pharyngeal- Thin Cup Delayed swallow initiation-vallecula;Lateral channel residue Pharyngeal -- Pharyngeal- Thin Straw Lateral channel residue;Delayed swallow initiation-vallecula Pharyngeal -- Pharyngeal- Puree Delayed swallow initiation-vallecula;WFL Pharyngeal -- Pharyngeal- Mechanical  Soft -- Pharyngeal -- Pharyngeal- Regular WFL Pharyngeal -- Pharyngeal- Multi-consistency -- Pharyngeal -- Pharyngeal- Pill WFL Pharyngeal -- Pharyngeal Comment --  CHL IP CERVICAL ESOPHAGEAL PHASE 05/14/2020 Cervical Esophageal Phase WFL Pudding Teaspoon -- Pudding Cup -- Honey Teaspoon -- Honey Cup -- Nectar Teaspoon -- Nectar Cup -- Nectar Straw -- Thin Teaspoon -- Thin Cup -- Thin Straw -- Puree -- Mechanical Soft -- Regular -- Multi-consistency -- Pill -- Cervical Esophageal Comment -- Thank you, Genene Churn, Kismet PORTER,DABNEY 05/14/2020, 2:21 PM  DG C-Arm 1-60 Min  Result Date: 05/25/2020 CLINICAL DATA:  Esophageal stent placement. EXAM: DG C-ARM 1-60 MIN; CHEST  1 VIEW FLUOROSCOPY TIME:  Fluoro time: 4 Minutes 17 secondsRadiation exposure index: MGy: 41.69Number of acquired spot images: Five spot fluoro graphic images. COMPARISON:  May 17, 2020.  May 23, 2020 FINDINGS: Spot fluoroscopy images were obtained for surgical planning purposes. Esophageal stent is seen traversing the thorax. Enteric contrast traverses the stent and enters the proximal stomach. None of the provided radiographs demonstrate evaluation of the site of previous leak into the RIGHT pleural space. Selective LEFT mainstem intubation. IMPRESSION: Spot fluoroscopy images from surgical planning purposes. Please reference procedure report for further details. Electronically Signed   By: Valentino Saxon MD   On: 05/25/2020 10:59   DG C-Arm 1-60 Min  Result Date: 05/17/2020 CLINICAL DATA:  Surgery. Esophageal stent placement. EXAM: DG C-ARM 1-60 MIN; CHEST  1 VIEW FLUOROSCOPY TIME:  Fluoroscopy Time:  5 minutes 48 seconds Radiation Exposure Index (if provided by the fluoroscopic device): 59.06 mGy Number of Acquired Spot Images: 3 COMPARISON:  Preprocedural CT yesterday. FINDINGS: Three fluoroscopic spot views obtained in the operating room. Endoscope is in place. A stent overlies the endoscope with tip  projecting over the esophageal diaphragmatic hiatus. Subsequent enteric tube courses in the stent. There is contrast opacifying the distal stent on image 1. Right chest tube is in place. IMPRESSION: Intraoperative fluoroscopy during esophageal stent placement. Please reference procedural report for details. Electronically Signed   By: Keith Rake M.D.   On: 05/17/2020 19:55   ECHOCARDIOGRAM COMPLETE  Result Date: 05/11/2020    ECHOCARDIOGRAM REPORT   Patient Name:   SHIELDS PAUTZ Honsinger Date of Exam: 05/11/2020 Medical Rec #:  [id number]           Height:       69.0 in Accession #:    [id number]          Weight:       153.0 lb Date of Birth:  09/28/1961           BSA:          1.844 m Patient Age:    22 years            BP:           141/90 mmHg Patient Gender: M                   HR:           100 bpm. Exam Location:  [facility] Procedure: 2D Echo, Cardiac Doppler and Color Doppler Indications:    CHF  History:        Patient has prior history of Echocardiogram examinations, most                 recent 07/13/2018. CHF, Stroke, Signs/Symptoms:Shortness of                 Breath; Risk Factors:Hypertension and Dyslipidemia. Cocaine                 abuse, Pneumonia.  Sonographer:    Dustin Flock RDCS Referring Phys: [id number] [place]  1. Left ventricular ejection fraction, by estimation, is 65 to 70%. The left ventricle has normal function. The left ventricle has no regional wall motion abnormalities. Left ventricular diastolic parameters are consistent with Grade I diastolic dysfunction (impaired relaxation).  2. Right ventricular systolic function is normal. The right ventricular size is normal. Tricuspid  regurgitation signal is inadequate for assessing PA pressure.  3. The mitral valve is grossly normal. Trivial mitral valve regurgitation.  4. The aortic valve is tricuspid. Aortic valve regurgitation is not visualized.  5. The inferior vena cava is normal in size with greater than 50%  respiratory variability, suggesting right atrial pressure of 3 mmHg. FINDINGS  Left Ventricle: Left ventricular ejection fraction, by estimation, is 65 to 70%. The left ventricle has normal function. The left ventricle has no regional wall motion abnormalities. The left ventricular internal cavity size was normal in size. There is  no left ventricular hypertrophy. Left ventricular diastolic parameters are consistent with Grade I diastolic dysfunction (impaired relaxation). Right Ventricle: The right ventricular size is normal. No increase in right ventricular wall thickness. Right ventricular systolic function is normal. Tricuspid regurgitation signal is inadequate for assessing PA pressure. Left Atrium: Left atrial size was normal in size. Right Atrium: Right atrial size was normal in size. Pericardium: There is no evidence of pericardial effusion. Mitral Valve: The mitral valve is grossly normal. Trivial mitral valve regurgitation. Tricuspid Valve: The tricuspid valve is grossly normal. Tricuspid valve regurgitation is mild. Aortic Valve: The aortic valve is tricuspid. Aortic valve regurgitation is not visualized. Pulmonic Valve: The pulmonic valve was grossly normal. Pulmonic valve regurgitation is trivial. Aorta: The aortic root is normal in size and structure. Venous: The inferior vena cava is normal in size with greater than 50% respiratory variability, suggesting right atrial pressure of 3 mmHg. IAS/Shunts: No atrial level shunt detected by color flow Doppler.  LEFT VENTRICLE PLAX 2D LVIDd:         4.30 cm  Diastology LVIDs:         2.68 cm  LV e' lateral:   6.42 cm/s LV PW:         0.82 cm  LV E/e' lateral: 11.1 LV IVS:        0.98 cm  LV e' medial:    5.11 cm/s LVOT diam:     2.20 cm  LV E/e' medial:  13.9 LV SV:         81 LV SV Index:   44 LVOT Area:     3.80 cm  RIGHT VENTRICLE RV Basal diam:  2.99 cm RV S prime:     21.60 cm/s TAPSE (M-mode): 3.1 cm LEFT ATRIUM             Index       RIGHT ATRIUM            Index LA diam:        3.50 cm 1.90 cm/m  RA Area:     14.20 cm LA Vol (A2C):   42.8 ml 23.21 ml/m RA Volume:   31.20 ml  16.92 ml/m LA Vol (A4C):   46.7 ml 25.33 ml/m LA Biplane Vol: 45.6 ml 24.73 ml/m  AORTIC VALVE LVOT Vmax:   124.00 cm/s LVOT Vmean:  72.900 cm/s LVOT VTI:    0.212 m  AORTA Ao Root diam: 3.00 cm MITRAL VALVE MV Area (PHT): 6.65 cm     SHUNTS MV Decel Time: 114 msec     Systemic VTI:  0.21 m MV E velocity: 71.10 cm/s   Systemic Diam: 2.20 cm MV A velocity: 113.00 cm/s MV E/A ratio:  0.63 Rozann Lesches MD Electronically signed by Rozann Lesches MD Signature Date/Time: 05/11/2020/4:35:39 PM    Final    DG ESOPHAGUS W SINGLE CM (SOL OR THIN BA)  Result Date: 05/28/2020 CLINICAL  DATA:  Esophageal stent with prior leak, reassessment for leak EXAM: ESOPHOGRAM/BARIUM SWALLOW TECHNIQUE: Single contrast examination was performed using 25 cc Omnipaque 300. FLUOROSCOPY TIME:  Fluoroscopy Time:  3 minutes, 24 seconds Radiation Exposure Index (if provided by the fluoroscopic device): 33 mGy Number of Acquired Spot Images: 0 COMPARISON:  Multiple exams, including 05/23/2020 FINDINGS: Mid to distal expandable esophageal stent noted. The prior right-sided leak from the distal margin of the stent is no longer present. Contrast medium extending around the distal margin of the stent primarily on the left side appears to be intraluminal and likely in between the stent and the wall of the distal esophagus or small hiatal hernia based on the contained appearance for example on image 26/8. This collection was noted to extend proximally along the distal half of the stent along the left side. This is not unexpected given the appearance on the chest CT of 05/24/2020, which showed some luminal gas tracking along the left side of the stent in this vicinity. Accordingly, no leak from the esophagus is currently identified. IMPRESSION: 1. No esophageal leak is currently identified. 2. A small amount of  intraluminal contrast extends around the stent distally, but this is felt to be due to incomplete apposition of the stent with the lumen, rather than a leak. Electronically Signed   By: Van Clines M.D.   On: 05/28/2020 08:29   DG ESOPHAGUS W SINGLE CM (SOL OR THIN BA)  Result Date: 05/23/2020 CLINICAL DATA:  Postop esophageal perforation 05/17/2020. EXAM: ESOPHOGRAM/BARIUM SWALLOW TECHNIQUE: Single contrast examination was performed using  water-soluble. FLUOROSCOPY TIME:  Fluoroscopy Time:  1 minutes 36 seconds Radiation Exposure Index (if provided by the fluoroscopic device): Number of Acquired Spot Images: 5 COMPARISON:  CT chest 05/16/2020. FINDINGS: In the recumbent LPO position, patient drank water soluble Omnipaque 300. There is a long esophageal wall stent centered in the mid esophagus. Leakage of contrast from the right posterolateral wall of the distal esophagus, in the region of the distal aspect of the wall stent, is seen with filling of the right pleural space. IMPRESSION: Persistent leak from the right posterolateral aspect of the esophagus, along the distal aspect of the stent, with filling of the adjacent right pleural space. Electronically Signed   By: Lorin Picket M.D.   On: 05/23/2020 10:05   US THORACENTESIS ASP PLEURAL SPACE W/IMG GUIDE  Result Date: 05/14/2020 INDICATION: Complicated RIGHT pleural effusion EXAM: ULTRASOUND GUIDED DIAGNOSTIC RIGHT THORACENTESIS MEDICATIONS: None. COMPLICATIONS: Probable RIGHT pneumothorax, see chest radiograph report PROCEDURE: An ultrasound guided thoracentesis was thoroughly discussed with the patient and questions answered. The benefits, risks, alternatives and complications were also discussed. The patient understands and wishes to proceed with the procedure. Written consent was obtained. Ultrasound was performed to localize and mark an adequate pocket of fluid in the posterior RIGHT chest. The area was then prepped and draped in the normal  sterile fashion. 1% Lidocaine was used for local anesthesia. Under ultrasound guidance a 5 [place] Yueh catheter was introduced. Thoracentesis was performed. The catheter was removed and a dressing applied. FINDINGS: A total of approximately 30 mL of cloudy yellow slightly thicker RIGHT pleural fluid was removed. Samples were sent to the laboratory as requested by the clinical team. IMPRESSION: Successful ultrasound guided diagnostic RIGHT thoracentesis yielding 30 mL of cloudy yellow RIGHT pleural fluid, cannot exclude infection/empyema with this appearance. Findings discussed with Dr. Elsworth Soho on 05/14/2020 at 1545 hours. Electronically Signed   By: Lavonia Dana M.D.   On: 05/14/2020  15:50     Time Spent in minutes  30     Desiree Hane M.D on 06/04/2020 at 3:23 PM  To page go to [email or web address] - password [facility]

## 2020-06-04 NOTE — Progress Notes (Signed)
Physical Therapy Treatment Patient Details Name: Evan Chavez MRN: [id number] DOB: 28-May-1961 Today's Date: 06/04/2020    History of Present Illness Pt is 59 yo male presents to the hospital on 7/28 with generalized weakness, productive cough and low-grade fever. Pt is currently being treated for hydropneumothorax, Sepsis aspiration pneumonia and esophagal rupture, AKI and acute delirium. Pt had successful R thoracentesis on 8/2. Pt had a chest tube put in on 8/2 and was removed 8/9. Pt also underwent a VATS, decortication, EGD, PEG tube placement, and a esophageal stent on 8/9. PMH of GERD, Barrett's esophagus, peptic ulcer disease with extensive gastric erosion, duodenal ulcer and esophageal stricture, also with history of prior stroke, hyperlipidemia, BPH, remote to cocaine abuse. Pt returned to OR on 8/13 for Right thoracoscopy, chest tube placement, esophageal stent placement.  Chest tube removed 8/20 and chest xray stable.    PT Comments    Patient not progressing with mobility today due to dizziness once sitting EOB and "not feeling well." Requires min A with trunk to get to EOB, for standing and for taking a few steps to get to chair with use of RW. Pt attempting to sit prematurely in chair. A&Ox3 today but difficult to fully assess cognition as pt with flat affect and minimal verbalizations. Pt with lots of secretions. HR ranged from 115-135 bpm with minimal activity. Encouraged increasing activity when feeling better. Goals updated. Will continue to follow.    Follow Up Recommendations  CIR     Equipment Recommendations  3in1 (PT);Rolling walker with 5" wheels    Recommendations for Other Services       Precautions / Restrictions Precautions Precautions: Fall Precaution Comments: PEG Restrictions Weight Bearing Restrictions: No    Mobility  Bed Mobility Overal bed mobility: Needs Assistance Bed Mobility: Rolling;Sidelying to Sit Rolling: Min guard Sidelying to sit:  Min assist;HOB elevated       General bed mobility comments: Light Min A needed for trunk.  Transfers Overall transfer level: Needs assistance Equipment used: Rolling walker (2 wheeled) Transfers: Sit to/from Stand Sit to Stand: Min assist Stand pivot transfers: Min assist       General transfer comment: Assist to power to standing; able to take a few steps to get to chair with use of RW and Min A. Trying to sit prematurely. Limited due to "not feeling well" and dizziness.  Ambulation/Gait             General Gait Details: Deferred due to dizziness and not feeling well per report. Attempted to sit EOB for awhile but feelings did not improve.   Stairs             Wheelchair Mobility    Modified Rankin (Stroke Patients Only)       Balance Overall balance assessment: Needs assistance Sitting-balance support: Feet supported;Single extremity supported Sitting balance-Leahy Scale: Fair Sitting balance - Comments: Could maintain static balance but with forward trunk, initially left lateral lean and BUE support needed progressing to 1 UE support.   Standing balance support: During functional activity Standing balance-Leahy Scale: Poor Standing balance comment: REquired use of RW and min guard-min A to steady                            Cognition Arousal/Alertness: Awake/alert Behavior During Therapy: Flat affect Overall Cognitive Status: Impaired/Different from baseline Area of Impairment: Attention;Following commands  Current Attention Level: Sustained   Following Commands: Follows one step commands consistently     Problem Solving: Slow processing;Decreased initiation;Difficulty sequencing;Requires verbal cues;Requires tactile cues General Comments: Pt with expressive difficulties at baseline; followed commands with cues. Minimal verbalizations. A&Ox3.      Exercises General Exercises - Lower Extremity Ankle  Circles/Pumps: AROM;Both;10 reps;Seated Quad Sets: AROM;Both;10 reps;Supine    General Comments General comments (skin integrity, edema, etc.): HR ranged from 115-135 bpm with limited activity; Sp02 remained in mid 90s on RA. BP pre activity 141/91, post activity 144/95.      Pertinent Vitals/Pain Pain Assessment: No/denies pain    Home Living                      Prior Function            PT Goals (current goals can now be found in the care plan section) Acute Rehab PT Goals Patient Stated Goal: get better PT Goal Formulation: With patient Time For Goal Achievement: 06/18/20 Potential to Achieve Goals: Good Progress towards PT goals: Not progressing toward goals - comment (due to dizziness and overall not feeling well)    Frequency    Min 3X/week      PT Plan Current plan remains appropriate    Co-evaluation              AM-PAC PT "6 Clicks" Mobility   Outcome Measure  Help needed turning from your back to your side while in a flat bed without using bedrails?: A Little Help needed moving from lying on your back to sitting on the side of a flat bed without using bedrails?: A Little Help needed moving to and from a bed to a chair (including a wheelchair)?: A Little Help needed standing up from a chair using your arms (e.g., wheelchair or bedside chair)?: A Little Help needed to walk in hospital room?: A Little Help needed climbing 3-5 steps with a railing? : A Lot 6 Click Score: 17    End of Session Equipment Utilized During Treatment: Gait belt Activity Tolerance: Other (comment) (not feeling well, dizziness) Patient left: in chair;with call bell/phone within reach;with chair alarm set Nurse Communication: Mobility status PT Visit Diagnosis: Unsteadiness on feet (R26.81);Muscle weakness (generalized) (M62.81)     Time: 0939-1000 PT Time Calculation (min) (ACUTE ONLY): 21 min  Charges:  $Therapeutic Activity: 8-22 mins                      Marisa Severin, PT, DPT Acute Rehabilitation Services Pager [phone] Office [place] 06/04/2020, 12:10 PM

## 2020-06-04 NOTE — Progress Notes (Signed)
Patient refused CPT via flutter device at this time. No respiratory distress noted. RT will monitor as needed.

## 2020-06-05 LAB — GLUCOSE, CAPILLARY
Glucose-Capillary: 123 mg/dL — ABNORMAL HIGH (ref 70–99)
Glucose-Capillary: 129 mg/dL — ABNORMAL HIGH (ref 70–99)
Glucose-Capillary: 147 mg/dL — ABNORMAL HIGH (ref 70–99)
Glucose-Capillary: 149 mg/dL — ABNORMAL HIGH (ref 70–99)
Glucose-Capillary: 161 mg/dL — ABNORMAL HIGH (ref 70–99)
Glucose-Capillary: 176 mg/dL — ABNORMAL HIGH (ref 70–99)

## 2020-06-05 LAB — COMPREHENSIVE METABOLIC PANEL
ALT: 205 U/L — ABNORMAL HIGH (ref 0–44)
AST: 89 U/L — ABNORMAL HIGH (ref 15–41)
Albumin: 1.9 g/dL — ABNORMAL LOW (ref 3.5–5.0)
Alkaline Phosphatase: 274 U/L — ABNORMAL HIGH (ref 38–126)
Anion gap: 10 (ref 5–15)
BUN: 13 mg/dL (ref 6–20)
CO2: 22 mmol/L (ref 22–32)
Calcium: 8.5 mg/dL — ABNORMAL LOW (ref 8.9–10.3)
Chloride: 102 mmol/L (ref 98–111)
Creatinine, Ser: 0.79 mg/dL (ref 0.61–1.24)
GFR calc Af Amer: >60 mL/min (ref >60)
GFR calc non Af Amer: >60 mL/min (ref >60)
Glucose, Bld: 145 mg/dL — ABNORMAL HIGH (ref 70–99)
Potassium: 4.2 mmol/L (ref 3.5–5.1)
Sodium: 134 mmol/L — ABNORMAL LOW (ref 135–145)
Total Bilirubin: 0.4 mg/dL (ref 0.3–1.2)
Total Protein: 7.4 g/dL (ref 6.5–8.1)

## 2020-06-05 LAB — CBC WITH DIFFERENTIAL/PLATELET
Abs Immature Granulocytes: 0.03 10*3/uL (ref 0.00–0.07)
Basophils Absolute: 0 10*3/uL (ref 0.0–0.1)
Basophils Relative: 0 %
Eosinophils Absolute: 0.1 10*3/uL (ref 0.0–0.5)
Eosinophils Relative: 1 %
HCT: 27.6 % — ABNORMAL LOW (ref 39.0–52.0)
Hemoglobin: 8.4 g/dL — ABNORMAL LOW (ref 13.0–17.0)
Immature Granulocytes: 0 %
Lymphocytes Relative: 8 %
Lymphs Abs: 0.8 10*3/uL (ref 0.7–4.0)
MCH: 25.9 pg — ABNORMAL LOW (ref 26.0–34.0)
MCHC: 30.4 g/dL (ref 30.0–36.0)
MCV: 85.2 fL (ref 80.0–100.0)
Monocytes Absolute: 0.8 10*3/uL (ref 0.1–1.0)
Monocytes Relative: 8 %
Neutro Abs: 7.6 10*3/uL (ref 1.7–7.7)
Neutrophils Relative %: 83 %
Platelets: 545 10*3/uL — ABNORMAL HIGH (ref 150–400)
RBC: 3.24 MIL/uL — ABNORMAL LOW (ref 4.22–5.81)
RDW: 15.3 % (ref 11.5–15.5)
WBC: 9.2 10*3/uL (ref 4.0–10.5)
nRBC: 0 % (ref 0.0–0.2)

## 2020-06-05 MED ORDER — OXYCODONE HCL 5 MG/5ML PO SOLN
5.0000 mg | Freq: Four times a day (QID) | ORAL | Status: DC | PRN
  Administered 2020-06-05 – 2020-06-29 (×3): 5 mg
  Filled 2020-06-05 (×3): qty 5

## 2020-06-05 MED ORDER — FREE WATER
150.0000 mL | Status: DC
  Administered 2020-06-05 – 2020-06-11 (×37): 150 mL

## 2020-06-05 MED ORDER — OSMOLITE 1.5 CAL PO LIQD
1000.0000 mL | ORAL | Status: DC
  Administered 2020-06-05 – 2020-07-10 (×36): 1000 mL
  Filled 2020-06-05 (×58): qty 1000

## 2020-06-05 NOTE — Progress Notes (Addendum)
      [address]       [place] [address]             [phone]      11 Days Post-Op Procedure(s) (LRB): ESOPHAGOGASTRODUODENOSCOPY (EGD); ESOPHAGEAL STENT REMOVAL. (N/A) ESOPHAGEAL STENT PLACEMENT USING A 23 MM COVERED STENT (N/A) VIDEO ASSISTED THORACOSCOPY (VATS)/EMPYEMA (Right) Subjective: Nods yes and no to questions but does not verbalize much else.   Objective: Vital signs in last 24 hours: Temp:  [98 F (36.7 C)-99.7 F (37.6 C)] 98.7 F (37.1 C) (08/24 0735) Pulse Rate:  [95-120] 119 (08/24 0735) Cardiac Rhythm: Sinus tachycardia (08/23 1950) Resp:  [20-39] 20 (08/24 0735) BP: (123-164)/(78-89) 137/79 (08/24 0735) SpO2:  [98 %-100 %] 98 % (08/24 0735) Weight:  [59.8 kg] 59.8 kg (08/24 0335)     Intake/Output from previous day: 08/23 0701 - 08/24 0700 In: 2202.7 [I.V.:665; NG/GT:1037.7; IV Piggyback:500] Out: 427.5 [Urine:425; Drains:2.5] Intake/Output this shift: No intake/output data recorded.  General appearance: cooperative Heart: sinus tachycadia Lungs: clear to auscultation bilaterally and tachypnea Abdomen: soft, non-tender; bowel sounds normal; no masses,  no organomegaly Extremities: extremities normal, atraumatic, no cyanosis or edema Wound: clean and dry  Lab Results: Recent Labs    06/04/20 0118 06/05/20 0106  WBC 9.9 9.2  HGB 9.1* 8.4*  HCT 30.0* 27.6*  PLT 581* 545*   BMET:  Recent Labs    06/04/20 0118 06/05/20 0106  NA 138 134*  K 4.4 4.2  CL 103 102  CO2 22 22  GLUCOSE 138* 145*  BUN 16 13  CREATININE 0.86 0.79  CALCIUM 8.7* 8.5*    PT/INR:  Recent Labs    06/02/20 1524  LABPROT 14.5  INR 1.2   ABG    Component Value Date/Time   PHART 7.443 05/26/2020 0335   HCO3 23.5 05/26/2020 0335   TCO2 27 05/17/2020 2036   ACIDBASEDEF 0.1 05/26/2020 0335   O2SAT 96.7 05/26/2020 0335   CBG (last 3)  Recent Labs    06/04/20 2037 06/05/20 0005 06/05/20 0448  GLUCAP 144* 149* 176*     Assessment/Plan: S/P Procedure(s) (LRB): ESOPHAGOGASTRODUODENOSCOPY (EGD); ESOPHAGEAL STENT REMOVAL. (N/A) ESOPHAGEAL STENT PLACEMENT USING A 23 MM COVERED STENT (N/A) VIDEO ASSISTED THORACOSCOPY (VATS)/EMPYEMA (Right)  1. CV- hemodynamically stable, sinus tachycardia, BP well controlled. 2. Pulm- CT removed, JP drain remains in place-no drainage recorded. CXR shows pleural effusion and atelectasis on the right side.  3. ID- patient febrile, WBC 9.2 this morning. blood cultures no growth to date. On Maxipime and Vanc for broad spectrum coverage. 4. GI- clear diet ordered however he is not swallowing anything 5. Deconditioning- once medically stable will need placement of CIR vs. SNF  Plan: Tachypnea this morning rate in the 30s. States he has no pain and no shortness of breath. Will order a CXR today, yesterdays with pleural effusion. May need another swallow study? Not swallowing due to pain. Will increase his pain meds to oxycodone 5mg  in his tube.    LOS: 25 days    Evan Chavez 06/05/2020  No events Continues to complain of pain.  No compliant with nursing, and physical therapy Leukocytosis improved on maxipime and vanc On goal tube feeds Minimal drain output.  Would like PO trial for better assessment.   Once drain removed, will be ready for discharge.  Evan Chavez

## 2020-06-05 NOTE — Evaluation (Signed)
Clinical/Bedside Swallow Evaluation Patient Details  Name: Evan Chavez MRN: [id number] Date of Birth: 05/11/1961  Today's Date: 06/05/2020 Time: SLP Start Time (ACUTE ONLY): 0900 SLP Stop Time (ACUTE ONLY): 0925 SLP Time Calculation (min) (ACUTE ONLY): 25 min  Past Medical History:  Past Medical History:  Diagnosis Date  . Barrett's esophagus   . Chronic anemia   . Cocaine abuse ([place])   . Duodenal ulcer   . Esophageal stricture   . ETOH abuse   . GERD (gastroesophageal reflux disease)   . Hyperlipidemia   . Hypertension   . Peptic ulcer   . Pneumonia   . Stroke (cerebrum) ([place]) 06/2018   right sided weakness   Past Surgical History:  Past Surgical History:  Procedure Laterality Date  . BIOPSY  08/25/2018   Procedure: BIOPSY;  Surgeon: [name], MD;  [facility];  Service: Endoscopy;;  . BIOPSY  12/13/2018   Procedure: BIOPSY;  Surgeon: [name], MD;  [facility];  Service: Endoscopy;;  esophagus  . COLONOSCOPY WITH PROPOFOL N/A 04/07/2019   Dr. [name]: 3 Tubular adenomas removed (4 to 8 mm in size), next colonoscopy 3 years.  . ESOPHAGEAL STENT PLACEMENT N/A 05/17/2020   Procedure: ESOPHAGEAL STENT PLACEMENT AND  PEG TUBE PLACEMENT;  Surgeon: Lajuana Matte, MD;  Location: [place];  Service: Thoracic;  Laterality: N/A;  . ESOPHAGEAL STENT PLACEMENT N/A 05/25/2020   Procedure: ESOPHAGEAL STENT PLACEMENT USING A 23 MM COVERED STENT;  Surgeon: Lajuana Matte, MD;  Location: [place];  Service: Thoracic;  Laterality: N/A;  . ESOPHAGOGASTRODUODENOSCOPY N/A 05/17/2020   Procedure: ESOPHAGOGASTRODUODENOSCOPY (EGD);  Surgeon: Lajuana Matte, MD;  Location: [place] [facility];  Service: Thoracic;  Laterality: N/A;  . ESOPHAGOGASTRODUODENOSCOPY N/A 05/25/2020   Procedure: ESOPHAGOGASTRODUODENOSCOPY (EGD); ESOPHAGEAL STENT REMOVAL.;  Surgeon: Lajuana Matte, MD;  Location: [facility];  Service: Thoracic;  Laterality: N/A;  .  ESOPHAGOGASTRODUODENOSCOPY (EGD) WITH PROPOFOL N/A 08/25/2018   Dr. [name]: Severely inflamed abnormal appearing mid/distal esophagus.  Encroachment on lumen suspicious for infiltrating neoplasm located be all from severe benign inflammation, extensive gastric erosions and extensive duodenal ulcerations.  Query ischemic process versus other.  Biopsy revealed Barrett's esophagus but negative for malignancy and H. pylori.  . ESOPHAGOGASTRODUODENOSCOPY (EGD) WITH PROPOFOL N/A 12/13/2018   Dr. [name]: peptic stricture s/p balloon dilatation. abnormal esophageal mucosa, by c/w barrett's without dysplasia, previous PUD completely healed.   Marland Kitchen POLYPECTOMY  04/07/2019   Procedure: POLYPECTOMY;  Surgeon: [name], MD;  [facility];  Service: Endoscopy;;  colon  . VIDEO ASSISTED THORACOSCOPY (VATS)/DECORTICATION Right 05/17/2020   Procedure: RIGHT VIDEO ASSISTED THORACOSCOPY (VATS)/DECORTICATION;  Surgeon: Lajuana Matte, MD;  Location: [facility];  Service: Thoracic;  Laterality: Right;  Marland Kitchen VIDEO ASSISTED THORACOSCOPY (VATS)/EMPYEMA Right 05/25/2020   Procedure: VIDEO ASSISTED THORACOSCOPY (VATS)/EMPYEMA;  Surgeon: Lajuana Matte, MD;  Location: [facility];  Service: Thoracic;  Laterality: Right;   HPI:  Evan Chavez is a 59 y.o. year old male initally admitted with generalized weakness, productive cough and low-grade fever shortness of breath initially admitted for pneumonia based off CTA chest showing bibasilar consolidation pleural effusions treated with ceftriaxone and azithromycin. Pt had an MBS on 05/14/20 (prior to any notes indicating esopahgeal perforation) that showed mild oropharyngeal dysphagia with only premature spillage, mild oral residue. Pt recommended to consume regualr/thin.  Study reviewed by Dr. Elsworth Soho. On that same day, Pt underwent a Ultrasound-guided thoracentesis to define parapneumonic effusion, suspected  to be from aspiration pna due to esophageal stricture. Found to have  RT apical pneumothorax. Pt was then transferred to [facility] for a VATS decortication (not done?) and Chest tube placed. Post procedure CT chest performed which showed esophageal perforation. CTCS then reported "He has a long history of peptic strictures that have been dilated on multiple occasions.  It appears as though this may be the culprit for his perforation."  Underwent esophageal stent placement and PEG tube placement on 8/4-8/5.  Did have episodes of persistent leakage from esophagus on esophagram as well as requiring right thorascopic for drainage of loculated pleural effusion on 8/13 by CT surgery,  repeat esophagram on 8/16 did not show any esophageal leak. Pt has not been accepting oral PO, or has been orally holding his secretions. Has also been treated for candida. PMH: GERD, Barrett's esophagus, peptic ulcer disease with extensive gastric erosion, duodenal ulcerand esophageal stricture status post balloon dilatation on 12/2018, also with history of prior stroke, hyperlipidemia, BPH, remote cocaine abuse and prior CVA.   Assessment / Plan / Recommendation Clinical Impression  Pt demonstrates concern for odynophagia; pt has been orally holding his saliva, refusing clear liquids. He is dysphonic and cough is also dysphonic. He reports 5/10 pain when swallowing ice and one sip of water, both of which required encouragement. Swallow otherwise appears swift and strong, likely consistent with pts MBS findings on 8/2 (prior to tx to [facility]). Pts affect if flat, but in open ended questions he also exhibits some signs of moderate cognitive impairment. Pt verbalizes that he does not know why he is in the hospital, has little awareness of his impairments, states his mother cares for him. Pt would benefit from FEES for instrumental assessment of swallowing and to examine oropharynx for source of pain.  SLP Visit Diagnosis: Dysphagia, oropharyngeal phase (R13.12)    Aspiration Risk  Mild aspiration risk;Risk for  inadequate nutrition/hydration    Diet Recommendation Thin liquid   Liquid Administration via: Cup;Straw Medication Administration: Whole meds with liquid Supervision: Staff to assist with self feeding Compensations: Slow rate;Small sips/bites Postural Changes: Seated upright at 90 degrees    Other  Recommendations Oral Care Recommendations: Oral care QID Other Recommendations: Have oral suction available   Follow up Recommendations        Frequency and Duration            Prognosis Prognosis for Safe Diet Advancement: Good Barriers to Reach Goals: Motivation;Behavior      Swallow Study   General Date of Onset: 05/10/20 HPI: Evan Chavez is a 59 y.o. year old male initally admitted with generalized weakness, productive cough and low-grade fever shortness of breath initially admitted for pneumonia based off CTA chest showing bibasilar consolidation pleural effusions treated with ceftriaxone and azithromycin. Pt had an MBS on 05/14/20 (prior to any notes indicating esopahgeal perforation) that showed mild oropharyngeal dysphagia with only premature spillage, mild oral residue. Pt recommended to consume regualr/thin.  Study reviewed by Dr. Elsworth Soho. On that same day, Pt underwent a Ultrasound-guided thoracentesis to define parapneumonic effusion, suspected to be from aspiration pna due to esophageal stricture. Found to have RT apical pneumothorax. Pt was then transferred to [facility] for a VATS decortication (not done?) and Chest tube placed. Post procedure CT chest performed which showed esophageal perforation. CTCS then reported "He has a long history of peptic strictures that have been dilated on multiple occasions.  It appears as though this may be the culprit for his perforation."  Underwent  esophageal stent placement and PEG tube placement on 8/4-8/5.  Did have episodes of persistent leakage from esophagus on esophagram as well as requiring right thorascopic for drainage of loculated  pleural effusion on 8/13 by CT surgery,  repeat esophagram on 8/16 did not show any esophageal leak. Pt has not been accepting oral PO, or has been orally holding his secretions. Has also been treated for candida. PMH: GERD, Barrett's esophagus, peptic ulcer disease with extensive gastric erosion, duodenal ulcerand esophageal stricture status post balloon dilatation on 12/2018, also with history of prior stroke, hyperlipidemia, BPH, remote cocaine abuse and prior CVA. Type of Study: Bedside Swallow Evaluation Previous Swallow Assessment: BSE/MBS 8/2 Diet Prior to this Study: Thin liquids Temperature Spikes Noted: No Respiratory Status: Room air History of Recent Intubation: No Behavior/Cognition: Alert;Requires cueing Oral Cavity Assessment: Excessive secretions Oral Care Completed by SLP: Yes Oral Cavity - Dentition: Adequate natural dentition Vision: Functional for self-feeding Self-Feeding Abilities: Able to feed self Patient Positioning: Upright in bed Baseline Vocal Quality: Low vocal intensity;Hoarse;Breathy Volitional Cough: Strong Volitional Swallow: Able to elicit    Oral/Motor/Sensory Function Overall Oral Motor/Sensory Function: Within functional limits   Ice Chips Ice chips: Impaired Oral Phase Functional Implications: Prolonged oral transit;Oral holding   Thin Liquid Thin Liquid: Impaired Presentation: Straw Oral Phase Functional Implications: Oral holding;Prolonged oral transit    Nectar Thick Nectar Thick Liquid: Not tested   Honey Thick Honey Thick Liquid: Not tested   Puree Puree: Not tested   Solid     Solid: Not tested     Herbie Baltimore, MA CCC-SLP  Acute Rehabilitation Services Pager [phone] Office [phone]  Lynann Beaver 06/05/2020,10:14 AM

## 2020-06-05 NOTE — Progress Notes (Signed)
[facility]  PROGRESS NOTE  Evan Chavez MCN:[id number] DOB: 1961/06/04 DOA: 05/10/2020 PCP: Rosita Fire, MD Admit date - 05/10/2020   Admitting Physician Lajuana Matte, MD  Outpatient Primary MD for the patient is Rosita Fire, MD  LOS - 25 Brief Narrative   Evan Chavez is a 59 y.o. year old male with medical history significant for GERD, Barrett's esophagus, peptic ulcer disease with extensive gastric erosion, duodenal ulcerand esophageal stricture status post balloon dilatation on 12/2018, also with history of prior stroke, hyperlipidemia, BPH, remote cocaine abuse and prior CVA who presented on 05/10/2020 to [facility] with generalized weakness, productive cough and low-grade fever shortness of breath initially admitted for pneumonia based off CTA chest showing bibasilar consolidation pleural effusions treated with ceftriaxone and azithromycin. Hospital course complicated by right-sided hydropneumothorax with possible lung collapse after right thoracentesis on 8/2, esophageal perforation (CT chest on 8/4)With large right-sided hydropneumothorax, complicated by aspiration pneumonia.  Patient underwent right-sided chest tube placement was transferred from [facility] to [facility] where he underwent VATS and decortication by CT surgery as well as esophageal stent placement and PEG tube placement on 8/4-8/5.  Patient has been managed on hospitalist service since.  Did have episodes of persistent leakage from esophagus on esophagram as well as requiring right thorascopic for drainage of loculated pleural effusion on 8/13 by CT surgery,  repeat esophagram on 8/16 did not show any esophageal leak. Currently hospitalist service is following as a consult and CT surgery is primary team.  Patient was restarted on broad spectrum antibiotics on 8/21 due to sepsis secondary to RLL pna evident on CXR and CT chest. His known pleural effusion showed slight decrease in size with  appropriate position of chest tube on last CT chest on 8/21. Given his persistent tachycardia and tachypnea repeat CXR obtained on 8/23 again noted stable RLL Pna and atlectasis.  Currently hospital course complicated by odynophagia/dysphagia and pocketing of oral secretions and poor adherence to pulmonary toilet. Speech is evaluating and assisting with planned FEES.   Subjective  Afebrile overnight Still has pain at site of left chest tube Jp drain Reports pain with swallowing Denies any SOB A & P   Sepsis presumed secondary to right base pneumonia, stable. Sepsis physiology resolved.  Chest x-ray again notes right base atelectasis/infiltrate and CT chest shows right lobe findings with known effusion/empyema on right side. Has remained afebrile since yesterday am, leukocytosis resolved, still has tachypnea and tachycardia.   Patient is not following pulmonary toilet and tends to pocket his oral secretions, at high risk for aspiration pneumonia. CT chest confirms chest tube in appropriate position relative to effusion and effusion looks smaller in size -Monitor repeat blood cultures --Continue broad spectrum antibiotics given patient is hospitalized with  Cefepime given poor po intake due to dsyphagia  --DC vanocmycin ( no MRSA on blood cultures) -Monitor CBC, fever/temp trend -Continue aggressive pulmonary toilet (discussed with patient and nursing) with incentive spirometry, flutter valve, out of bed to chair  Acute hypoxic respiratory failure of multifactorial etiology (bibasilar pneumonia, loculated parapneumonic effusion/complex on right, suspect ongoing left, right apical pneumothorax). Stable respiratory status on room air.  Chest tube removed on 8/20 with no pneumothorax on repeat chest x-ray -completed course of  fluconazole (8/5-for Candida Parapsilosis on pleural effusion studies (05/17/20)  Sepsis secondary to aspiration pneumonia and mediastinitis from prior esophageal rupture,   resolved.  In setting of esophageal rupture.    Esophageal rupture leading to right-sided hydropneumothorax and right-sided  complex loculated parapneumonic effusion.  Cultures from empyema fluid positive for candida parapsolis (8/5)Minimal output from chest tube bulb. CT chest shows last remaining chest tube in appropriate place with slight decrease in size of right empyema -Chest tube  removed per CT surgery on 8/20, follow-up post removal chest x-ray with no pneuomthorax --continue fluconazole --Continue Mucomyst, flutter valve, chest PT-  Orophayrngeal dysphagia, in the setting of esophageal rupture. Last speech eval on 8/2 recommended regular thin liquid diet. He does not seem to be able to swallow his on oral secretions or liquis -- Speech recommends FEES, hold on clear liquid diet started on 8/19 by CTS -Continue tube feeds  Acute on chronic blood loss anemia, stable.  In the setting of blood loss from esophageal rupture and surgery as well as hematemesis during hospitalization.  Hemoglobin currently stable -Monitor CBC  Hypophosphatemia, resolved. Improved with IV supplementation  Impaired mobility/generalized weakness -PT recommends SNF -Social worker consulted -Monitor CBC, transfuse if hemoglobin less than 7  Intermittent delirium, resolved.  Likely hospital-acquired given prolonged course.  Currently alert and oriented to place, self, time, context no longer restless or agitated. -Delirium precautions -As needed IV Haldol has been helpful   Paroxysmal atrial fibrillation.  No longer A. fib.   --Cardiology advised discontinuing diltiazem, Continuing metoprolol 50 mg twice daily  --Cardiology recommends DOAC when he is able to tolerate  Sinus tachycardia, mild with heart rate in the low 100s not A. fib on my review of telemetry, suspect related to dehydration due to water deficits and infection driven.  TSH within normal limits, hemoglobin stable., CTA chest on 7/29 no PE.   Likely persistent related to ongoing infection and elements of dehydration with poor po intake related to dysphagia/odynophagia -Monitor hydration , appreciate nutrition recommendations for free water flushes 150 ccq4 hr --continue antibiotics --Clear liquid diet,--speech currently evaluating -Continue to monitor on telemetry  Prior CVA (07/2018)prior to admission. No known residual deficits -Holding home aspirin 325 - will likely resume Lipitor on discharge, continue to monitor LFTs  Elevated LFTs, continue to downtrend Denies any abdominal pain.  Hepatitis panel unremarkable -Continue to hold home statins, expect improvement with discontinuation of fluconazole  Left hydronephrosis.  Noted on CT scan.  As well as 9 mm obstructing proximal left ureteral stone  urology was consulted during hospitalization and recommended continue original plan of shockwave lithotripsy. this was deferred during current hospitalization due to respiratory status -Urology will arrange close follow-up to get this scheduled,        Family Communication  : Mother updated at bedside on 8/21  Code Status :  FULL  Disposition Plan  :  Patient is from home. Anticipated d/c date: 2 to 3 days. Barriers to d/c or necessity for inpatient status:  contine  broad-spectrum antibiotics for sepsis secondary to right lower lobe pneumonia--still requiring IV antibiotics given dysphagia, still needing final disposition for anticoagulation--cardiology recommending DOAC at discharge if able to tolerate, speech eval given dysphagia,   DVT Prophylaxis  :  SCDs   Lab Results  Component Value Date   PLT 545 (H) 06/05/2020    Diet :  Diet Order            Diet clear liquid Room service appropriate? Yes; Fluid consistency: Thin  Diet effective now                  Inpatient Medications Scheduled Meds: . bisacodyl  10 mg Oral Daily  . feeding supplement  1 Container Oral  BID BM  . feeding supplement (PROSource TF)   45 mL Per Tube BID  . free water  150 mL Per Tube Q4H  . insulin aspart  0-24 Units Subcutaneous TID AC & HS  . mouth rinse  15 mL Mouth Rinse q12n4p  . metoprolol tartrate  50 mg Per Tube BID  . pantoprazole (PROTONIX) IV  40 mg Intravenous Q12H  . sennosides  5 mL Per Tube QHS   Continuous Infusions: . sodium chloride 70 mL/hr at 05/16/20 2122  . sodium chloride    . sodium chloride    . sodium chloride 75 mL/hr at 06/05/20 0955  . ceFEPime (MAXIPIME) IV 2 g (06/05/20 4098)  . feeding supplement (OSMOLITE 1.5 CAL) 1,000 mL (06/05/20 1152)   PRN Meds:.sodium chloride, Place/Maintain arterial line **AND** sodium chloride, Place/Maintain arterial line **AND** sodium chloride, [DISCONTINUED] acetaminophen **OR** acetaminophen, fentaNYL (SUBLIMAZE) injection, hydrALAZINE, ipratropium-albuterol, metoprolol tartrate, ondansetron (ZOFRAN) IV, oxyCODONE, traMADol  Antibiotics  :   Anti-infectives (From admission, onward)   Start     Dose/Rate Route Frequency Ordered Stop   06/03/20 0200  vancomycin (VANCOCIN) IVPB 1000 mg/200 mL premix  Status:  Discontinued        1,000 mg 200 mL/hr over 60 Minutes Intravenous Every 12 hours 06/02/20 1248 06/05/20 0915   06/02/20 2200  ceFEPIme (MAXIPIME) 2 g in sodium chloride 0.9 % 100 mL IVPB        2 g 200 mL/hr over 30 Minutes Intravenous Every 8 hours 06/02/20 1251     06/02/20 2000  piperacillin-tazobactam (ZOSYN) IVPB 3.375 g  Status:  Discontinued        3.375 g 12.5 mL/hr over 240 Minutes Intravenous Every 8 hours 06/02/20 1248 06/02/20 1249   06/02/20 1330  vancomycin (VANCOREADY) IVPB 1250 mg/250 mL        1,250 mg 166.7 mL/hr over 90 Minutes Intravenous  Once 06/02/20 1248 06/02/20 1646   06/02/20 1330  piperacillin-tazobactam (ZOSYN) IVPB 3.375 g  Status:  Discontinued        3.375 g 100 mL/hr over 30 Minutes Intravenous  Once 06/02/20 1248 06/02/20 1249   06/02/20 1215  vancomycin (VANCOCIN) IVPB 1000 mg/200 mL premix  Status:   Discontinued        1,000 mg 200 mL/hr over 60 Minutes Intravenous  Once 06/02/20 1202 06/02/20 1251   06/02/20 1215  ceFEPIme (MAXIPIME) 2 g in sodium chloride 0.9 % 100 mL IVPB        2 g 200 mL/hr over 30 Minutes Intravenous  Once 06/02/20 1202 06/02/20 1506   05/29/20 1800  vancomycin (VANCOCIN) IVPB 1000 mg/200 mL premix  Status:  Discontinued        1,000 mg 200 mL/hr over 60 Minutes Intravenous Every 12 hours 05/29/20 1151 05/30/20 0912   05/24/20 2100  vancomycin (VANCOREADY) IVPB 750 mg/150 mL  Status:  Discontinued        750 mg 150 mL/hr over 60 Minutes Intravenous Every 12 hours 05/24/20 0813 05/29/20 1151   05/24/20 0900  vancomycin (VANCOREADY) IVPB 1500 mg/300 mL        1,500 mg 150 mL/hr over 120 Minutes Intravenous  Once 05/24/20 0813 05/24/20 1221   05/17/20 0900  fluconazole (DIFLUCAN) IVPB 400 mg  Status:  Discontinued        400 mg 100 mL/hr over 120 Minutes Intravenous Every 24 hours 05/17/20 0749 06/05/20 0915   05/17/20 0600  vancomycin (VANCOREADY) IVPB 750 mg/150 mL  Status:  Discontinued  750 mg 150 mL/hr over 60 Minutes Intravenous Every 8 hours 05/16/20 2154 05/17/20 0903   05/17/20 0400  vancomycin (VANCOREADY) IVPB 750 mg/150 mL  Status:  Discontinued        750 mg 150 mL/hr over 60 Minutes Intravenous Every 8 hours 05/16/20 1945 05/16/20 2154   05/16/20 2200  vancomycin (VANCOREADY) IVPB 1250 mg/250 mL        1,250 mg 166.7 mL/hr over 90 Minutes Intravenous STAT 05/16/20 2153 05/16/20 2348   05/16/20 2000  vancomycin (VANCOREADY) IVPB 1250 mg/250 mL  Status:  Discontinued        1,250 mg 166.7 mL/hr over 90 Minutes Intravenous  Once 05/16/20 1945 05/16/20 2153   05/15/20 1600  piperacillin-tazobactam (ZOSYN) IVPB 3.375 g  Status:  Discontinued        3.375 g 12.5 mL/hr over 240 Minutes Intravenous Every 8 hours 05/15/20 0945 05/30/20 0912   05/15/20 0930  piperacillin-tazobactam (ZOSYN) IVPB 3.375 g        3.375 g 100 mL/hr over 30 Minutes  Intravenous  Once 05/15/20 0921 05/15/20 1009   05/13/20 2000  azithromycin (ZITHROMAX) tablet 500 mg        500 mg Oral Daily 05/13/20 1155 05/14/20 2127   05/10/20 2200  cefTRIAXone (ROCEPHIN) 2 g in sodium chloride 0.9 % 100 mL IVPB  Status:  Discontinued        2 g 200 mL/hr over 30 Minutes Intravenous Every 24 hours 05/10/20 2148 05/15/20 0911   05/10/20 2200  azithromycin (ZITHROMAX) 500 mg in sodium chloride 0.9 % 250 mL IVPB  Status:  Discontinued        500 mg 250 mL/hr over 60 Minutes Intravenous Every 24 hours 05/10/20 2148 05/13/20 1155       Objective   Vitals:   06/05/20 0633 06/05/20 0735 06/05/20 1100 06/05/20 1112  BP:  137/79 137/84   Pulse:  (!) 119    Resp: (!) 30 20 (!) 26   Temp:  98.7 F (37.1 C) 98.6 F (37 C) 98.6 F (37 C)  TempSrc:  Oral Oral Oral  SpO2:  98% 98% 98%  Weight:      Height:        SpO2: 98 % O2 Flow Rate (L/min): 4 L/min FiO2 (%): 21 %  Wt Readings from Last 3 Encounters:  06/05/20 59.8 kg  05/09/20 69.4 kg  12/13/19 68.2 kg     Intake/Output Summary (Last 24 hours) at 06/05/2020 1327 Last data filed at 06/05/2020 7209 Gross per 24 hour  Intake 2202.67 ml  Output 2.5 ml  Net 2200.17 ml    Physical Exam:     Awake Alert, Oriented X person, place, time, flat affect Copious amounts of oral secretions On room air, normal respiratory effort, tachypneic, diminished breath sounds on right side, no wheezing or rhonchi, chest tube bulb with minimal drainage Abdomen soft, nondistended, PEG tube in place Tachycardic, normal rhythm,No Gallops,Rubs or new Murmurs,  Moves all extremities on own with no noted focal deficits    I have personally reviewed the following:   Data Reviewed:  CBC Recent Labs  Lab 06/01/20 0019 06/02/20 0038 06/03/20 0121 06/04/20 0118 06/05/20 0106  WBC 15.3* 21.2* 14.9* 9.9 9.2  HGB 9.4* 9.4* 9.2* 9.1* 8.4*  HCT 30.8* 30.3* 30.3* 30.0* 27.6*  PLT 739* 688* 697* 581* 545*  MCV 85.6 86.6  87.1 84.7 85.2  MCH 26.1 26.9 26.4 25.7* 25.9*  MCHC 30.5 31.0 30.4 30.3 30.4  RDW 14.8  15.2 15.4 15.2 15.3  LYMPHSABS 0.9 1.1 1.2 0.8 0.8  MONOABS 0.8 1.2* 1.1* 0.8 0.8  EOSABS 0.0 0.1 0.1 0.0 0.1  BASOSABS 0.1 0.1 0.1 0.0 0.0    Chemistries  Recent Labs  Lab 06/01/20 0019 06/02/20 0038 06/03/20 0121 06/04/20 0118 06/05/20 0106  NA 131* 134* 138 138 134*  K 4.4 4.5 4.6 4.4 4.2  CL 100 103 103 103 102  CO2 22 24 22 22 22   GLUCOSE 158* 151* 123* 138* 145*  BUN 14 18 19 16 13   CREATININE 0.79 0.90 0.83 0.86 0.79  CALCIUM 8.6* 8.5* 9.0 8.7* 8.5*  AST 57* 37 72* 96* 89*  ALT 167* 130* 152* 213* 205*  ALKPHOS 266* 262* 275* 295* 274*  BILITOT 0.5 0.4 0.5 0.5 0.4   ------------------------------------------------------------------------------------------------------------------ No results for input(s): CHOL, HDL, LDLCALC, TRIG, CHOLHDL, LDLDIRECT in the last 72 hours.  Lab Results  Component Value Date   HGBA1C 5.5 07/12/2018   ------------------------------------------------------------------------------------------------------------------ No results for input(s): TSH, T4TOTAL, T3FREE, THYROIDAB in the last 72 hours.  Invalid input(s): FREET3 ------------------------------------------------------------------------------------------------------------------ No results for input(s): VITAMINB12, FOLATE, FERRITIN, TIBC, IRON, RETICCTPCT in the last 72 hours.  Coagulation profile Recent Labs  Lab 06/02/20 1240 06/02/20 1524  INR 1.2 1.2    No results for input(s): DDIMER in the last 72 hours.  Cardiac Enzymes No results for input(s): CKMB, TROPONINI, MYOGLOBIN in the last 168 hours.  Invalid input(s): CK ------------------------------------------------------------------------------------------------------------------    Component Value Date/Time   BNP 68.0 05/14/2020 0604    Micro Results Recent Results (from the past 240 hour(s))  Culture, blood (routine x 2)      Status: None (Preliminary result)   Collection Time: 06/01/20 10:28 AM   Specimen: BLOOD LEFT ARM  Result Value Ref Range Status   Specimen Description BLOOD LEFT ARM  Final   Special Requests BOTTLES DRAWN AEROBIC ONLY  Final   Culture   Final    NO GROWTH 4 DAYS Performed at [facility], [address], [place], [place] [address]    Report Status PENDING  Incomplete  Culture, blood (routine x 2)     Status: None (Preliminary result)   Collection Time: 06/01/20 10:30 AM   Specimen: BLOOD LEFT HAND  Result Value Ref Range Status   Specimen Description BLOOD LEFT HAND  Final   Special Requests   Final    BOTTLES DRAWN AEROBIC ONLY Blood Culture adequate volume   Culture   Final    NO GROWTH 4 DAYS Performed at [facility], [place] [address] [place], [place] [address]    Report Status PENDING  Incomplete  Culture, blood (x 2)     Status: None (Preliminary result)   Collection Time: 06/02/20  3:18 PM   Specimen: BLOOD LEFT HAND  Result Value Ref Range Status   Specimen Description BLOOD LEFT HAND  Final   Special Requests   Final    BOTTLES DRAWN AEROBIC AND ANAEROBIC Blood Culture adequate volume   Culture   Final    NO GROWTH 3 DAYS Performed at [facility], [place] [address], [place], [place] [address]    Report Status PENDING  Incomplete  Culture, blood (x 2)     Status: None (Preliminary result)   Collection Time: 06/02/20  3:24 PM   Specimen: BLOOD RIGHT HAND  Result Value Ref Range Status   Specimen Description BLOOD RIGHT HAND  Final   Special Requests   Final  BOTTLES DRAWN AEROBIC AND ANAEROBIC Blood Culture adequate volume   Culture   Final    NO GROWTH 3 DAYS Performed at [facility], [place] [address] [place], [place] [address]    Report Status PENDING  Incomplete    Radiology Reports DG Chest 1 View  Result Date: 05/26/2020 CLINICAL DATA:  Esophageal anastomotic leak EXAM: CHEST  1 VIEW COMPARISON:  05/25/2020 FINDINGS: Right  chest tubes and esophageal stent are again noted. There is new near total opacification of the right hemithorax. Right pleural effusion is present. Left lung remains clear. No pneumothorax. Cardiomediastinal contours are partially obscured. There is mild rightward mediastinal shift. IMPRESSION: New near total opacification of the right hemithorax likely reflecting combination of effusion and atelectasis. Electronically Signed   By: Macy Mis M.D.   On: 05/26/2020 08:21   DG Chest 1 View  Result Date: 05/25/2020 CLINICAL DATA:  Esophageal stent placement. EXAM: DG C-ARM 1-60 MIN; CHEST  1 VIEW FLUOROSCOPY TIME:  Fluoro time: 4 Minutes 17 secondsRadiation exposure index: MGy: 41.69Number of acquired spot images: Five spot fluoro graphic images. COMPARISON:  May 17, 2020.  May 23, 2020 FINDINGS: Spot fluoroscopy images were obtained for surgical planning purposes. Esophageal stent is seen traversing the thorax. Enteric contrast traverses the stent and enters the proximal stomach. None of the provided radiographs demonstrate evaluation of the site of previous leak into the RIGHT pleural space. Selective LEFT mainstem intubation. IMPRESSION: Spot fluoroscopy images from surgical planning purposes. Please reference procedure report for further details. Electronically Signed   By: Valentino Saxon MD   On: 05/25/2020 10:59   DG Chest 1 View  Result Date: 05/17/2020 CLINICAL DATA:  Surgery. Esophageal stent placement. EXAM: DG C-ARM 1-60 MIN; CHEST  1 VIEW FLUOROSCOPY TIME:  Fluoroscopy Time:  5 minutes 48 seconds Radiation Exposure Index (if provided by the fluoroscopic device): 59.06 mGy Number of Acquired Spot Images: 3 COMPARISON:  Preprocedural CT yesterday. FINDINGS: Three fluoroscopic spot views obtained in the operating room. Endoscope is in place. A stent overlies the endoscope with tip projecting over the esophageal diaphragmatic hiatus. Subsequent enteric tube courses in the stent. There is  contrast opacifying the distal stent on image 1. Right chest tube is in place. IMPRESSION: Intraoperative fluoroscopy during esophageal stent placement. Please reference procedural report for details. Electronically Signed   By: Keith Rake M.D.   On: 05/17/2020 19:55   DG Chest 1 View  Result Date: 05/14/2020 CLINICAL DATA:  Complicated RIGHT pleural effusion post diagnostic thoracentesis EXAM: CHEST  1 VIEW COMPARISON:  05/10/2020 FINDINGS: Enlargement of cardiac silhouette. Mediastinal contours and pulmonary vascularity normal. Persistent RIGHT pleural effusion and significant atelectasis and/or consolidation of the mid to lower RIGHT lung. New lucency at the RIGHT apex, suspect representing apical pneumothorax. No mediastinal shift. Patient asymptomatic. Persistent infiltrate LEFT lower lobe and tiny LEFT pleural effusion. IMPRESSION: Probable RIGHT apical pneumothorax post diagnostic thoracentesis. Persistent complicated partially loculated RIGHT pleural effusion, RIGHT basilar atelectasis versus consolidation, and LEFT lower lobe consolidation. Patient asymptomatic. Findings discussed with Dr. Elsworth Soho on 05/14/2020 at 1545 hours. Electronically Signed   By: Lavonia Dana M.D.   On: 05/14/2020 15:51   CT CHEST WO CONTRAST  Result Date: 05/16/2020 CLINICAL DATA:  Pleural effusion. EXAM: CT CHEST WITHOUT CONTRAST TECHNIQUE: Multidetector CT imaging of the chest was performed following the standard protocol without IV contrast. COMPARISON:  CT dated May 10, 2020 FINDINGS: Cardiovascular: The heart size is stable. There is some shift of the  mediastinum to the left. There is a trace pericardial effusion. Mild atherosclerotic changes are noted of the thoracic aorta. Mediastinum/Nodes: --mild mediastinal adenopathy is noted -- there is mild bilateral hilar adenopathy. -- No axillary lymphadenopathy. -- No supraclavicular lymphadenopathy. --there is a left-sided thyroid nodule measuring approximately 2.4 cm.  -the esophagus is diffusely thickened. There appears to be a defect in the right lateral aspect of the mid to distal esophagus (axial series 3, image [id number]). Adjacent to this defect is extraluminal oral contrast tracking into the right pleural space. Lungs/Pleura: There is been interval placement of a right-sided chest tube. There is a large right-sided hydropneumothorax which has increased in size since the patient's prior study despite placement of a right-sided chest tube. Additionally. The pleural effusion component appears more complex than prior study. There is new extensive debris within bronchus intermedius as well as the right lower lobe and right middle lobe bronchi consistent with significant interval aspiration. The right middle lobe and right lower lobe are essentially collapsed. There are hypoattenuating components within the right middle and right lower lobes concerning for developing aspiration pneumonia. There are new areas of consolidation involving the left lower and left upper lobes concerning for aspiration. There is a small to moderate-sized left-sided pleural effusion. The chest tube is kinked at its distal most aspect. Upper Abdomen: There is no definite acute abnormality in the upper abdomen. Musculoskeletal: No chest wall abnormality. No bony spinal canal stenosis. IMPRESSION: 1. Overall findings consistent with esophageal perforation arising from the right lateral aspect of the mid to distal esophagus as detailed above. There is extraluminal oral contrast tracking from this esophageal wall defect into the right pleural space. 2. Large right-sided hydropneumothorax with significant interval worsening since the prior study despite right-sided chest tube placement. Additionally, the fluid component appears more complex. The right-sided chest tube appears to enter the thoracic cavity inferiorly and courses along the right hemidiaphragm and terminates in the region of the right epicardial fat  pad. The tube is kinked at its tip. While the tube is likely intrathoracic, it will need significant repositioning to be effective. 3. Findings consistent with interval aspiration with complete occlusion of bronchus intermedius as well as the right middle and right lower lobe bronchi. There is likely developing aspiration pneumonia within the collapsed right middle and right lower lobes as well as the posterior left upper and left lower lobes. There is a small left-sided pleural effusion. 4. Persistent diffuse esophageal wall thickening of unknown clinical significance. Correlation with endoscopy is recommended as an underlying mass is not excluded. 5. Again noted is a left-sided thyroid nodule measuring approximately 2.4 cm. Outpatient thyroid ultrasound follow-up is recommended.(Ref: J Am Coll Radiol. 2015 Feb;12(2): 143-50). These results were called by telephone at the time of interpretation on 05/16/2020 at 6:04 pm to provider [facility] , who verbally acknowledged these results. Electronically Signed   By: Constance Holster M.D.   On: 05/16/2020 18:04   CT CHEST W CONTRAST  Result Date: 06/02/2020 CLINICAL DATA:  59 year old male with recent esophageal perforation with esophageal stent placement and VATS for RIGHT empyema. EXAM: CT CHEST WITH CONTRAST TECHNIQUE: Multidetector CT imaging of the chest was performed during intravenous contrast administration. CONTRAST:  21mL OMNIPAQUE IOHEXOL 300 MG/ML  SOLN COMPARISON:  05/24/2020 CT FINDINGS: Cardiovascular: UPPER limits normal heart size again noted. Heavy coronary artery calcifications are again identified. There is no evidence of thoracic aortic aneurysm or pericardial effusion. Mediastinum/Nodes: Esophageal stent is again noted. A small amount of  paraesophageal gas is again noted and connects to the posteromedial loculated RIGHT pleural collection which measures 2.8 x 7.7 cm in greatest diameter, previously 4.2 x 7.7 cm. No new findings are noted.  Mildly prominent mediastinal and RIGHT hilar lymph nodes are likely reactive. Lungs/Pleura: A RIGHT thoracostomy tube courses through the 2.8 x 7.7 cm posteromedial loculated RIGHT pleural collection with tip located 8 cm SUPERIOR to the collection. A small RIGHT pleural effusion is noted. Moderate RIGHT LOWER lung atelectasis is present. There is no evidence of pneumothorax. Upper Abdomen: Probable moderate LEFT hydronephrosis again noted. Musculoskeletal: No acute or suspicious bony abnormalities are present. IMPRESSION: 1. Slightly decreased size of posteromedial RIGHT pleural collection/empyema from 05/24/2020. The RIGHT thoracostomy tube courses through this collection with tip located 8 cm superiorly. 2. Small RIGHT pleural effusion and moderate RIGHT LOWER lung atelectasis. 3. Esophageal stent again noted. 4. Probable moderate LEFT hydronephrosis. Consider further evaluation with ultrasound or CT as clinically indicated. 5. Coronary artery disease andaortic Atherosclerosis (ICD10-I70.0). Electronically Signed   By: Margarette Canada M.D.   On: 06/02/2020 14:41   CT CHEST W CONTRAST  Result Date: 05/24/2020 CLINICAL DATA:  Esophageal perforation, progressive leukocytosis, abnormal chest x-ray, dyspnea EXAM: CT CHEST WITH CONTRAST TECHNIQUE: Multidetector CT imaging of the chest was performed during intravenous contrast administration. CONTRAST:  56mL OMNIPAQUE IOHEXOL 350 MG/ML SOLN COMPARISON:  Chest radiograph 05/22/2020, CT 05/10/2020 FINDINGS: Cardiovascular: Extensive coronary artery calcification. Global cardiac size within normal limits. Trace pericardial fluid is likely physiologic in is stable since prior examination. Thoracic aorta is age-appropriate. Mediastinum/Nodes: And endoluminal stent graft has been placed within the esophagus extending from the level of the sternal notch within the proximal esophagus through the mid to distal esophagus at the level of the left atrium. The stent graft is not  completely appose the esophageal lumen proximally, best seen on axial image # 27/3. Additionally, there is a defect involving the right posterolateral aspect of the esophagus, best seen on axial image # 77/3, with an adjacent loculated collection of gas, fluid, and extravasated contrast measuring 4.2 x 7.6 x 8.4 cm on axial image # 82 and sagittal image # 77. The distal margin of the stent graft does appear to completely apposed the esophageal lumen, though this is extremely close to the point of perforation. The distal esophagus again demonstrates marked circumferential wall thickening in keeping with esophagitis. Numerous shotty enhancing right paratracheal lymph nodes are identified, likely reactive. No pathologic thoracic adenopathy. Largely cystic nodule again identified within the left thyroid lobe, unchanged. Lungs/Pleura: Right chest tube is in place extending posteriorly and apically. There is collapse and consolidation of the right middle and lower lobes. There is partially loculated gas and fluid within the right pleural space laterally and within the major inter lobar fissure. The pleural fluid component is relatively small and communicates with the chest tube and has significantly decreased in volumes since prior examination, both in its free-flowing component as well as the a loculated component within the inter lobar fissure. The previously noted extravasated collection, however, does appear isolated from the chest tube. Bronchial wall thickening within the aerated right upper lobe is in keeping with airway inflammation. The central airways of the right lower lobe appear impacted and fluid filled. Minimal infiltrate is seen within the basilar lingula and left lower lobe, infectious or inflammatory in nature. No central obstructing mass. No pneumothorax or pleural effusion on the left. Upper Abdomen: Left hydronephrosis is partially visualized, incompletely assessed on this examination. This  appears  similar to that noted on prior examination of 05/13/2020. Musculoskeletal: No acute bone abnormality. IMPRESSION: Interval placement of right chest tube with significant evacuation loculated right pleural fluid. A small free-flowing fluid in gas component remains as well as a small partially loculated component within the inter lobar fissure. Interval placement of an and luminal stent graft within the esophagus with incomplete apposition of the graft proximally likely resulting in contrast coursing between the stent graft and the esophageal lumen to the point of leakage noted on prior esophagram. Pilar Plate perforation of the right posterolateral mid to distal esophagus again noted. Associated loculated extravasated contrast and fluid collection within the right pleural space appears isolated from the adjacent right chest tube. Collapse and consolidation of the right middle and right lower lobe with extensive fluid opacification of the right lower lobe airways. Airway inflammation involving the right upper lobe and probable minimal infectious or inflammatory infiltrate within the left lung base. Left hydronephrosis, similar to that noted on prior examination, incompletely evaluated on this examination. Electronically Signed   By: Fidela Salisbury MD   On: 05/24/2020 15:21   CT Angio Chest PE W and/or Wo Contrast  Result Date: 05/11/2020 CLINICAL DATA:  Shortness of breath and weakness for several days EXAM: CT ANGIOGRAPHY CHEST WITH CONTRAST TECHNIQUE: Multidetector CT imaging of the chest was performed using the standard protocol during bolus administration of intravenous contrast. Multiplanar CT image reconstructions and MIPs were obtained to evaluate the vascular anatomy. CONTRAST:  38mL OMNIPAQUE IOHEXOL 350 MG/ML SOLN COMPARISON:  Chest x-ray from earlier in the same day, CTA from 07/12/2018. FINDINGS: Cardiovascular: Thoracic aorta demonstrates atherosclerotic calcifications. No aneurysmal dilatation or  dissection is noted. No cardiac enlargement is seen. Coronary calcifications are noted. The pulmonary artery shows a normal branching pattern. No definitive filling defect is identified to suggest pulmonary embolism. Mediastinum/Nodes: Thoracic inlet shows a hypodensity within the left lobe of the thyroid measuring 17 mm in greatest dimension. This is relatively stable from a prior CT examination from 2019. No sizable hilar or mediastinal adenopathy is noted. The esophagus demonstrates some diffuse wall thickening which may be related to reflux esophagitis. Lungs/Pleura: Bilateral moderate pleural effusions are seen. Associated lower lobe consolidation is noted. No sizable parenchymal nodules are seen. No pneumothorax is noted. Upper Abdomen: Visualized upper abdomen shows geographic decreased attenuation within the left kidney and enlargement of the left kidney. This may simply be related to hydronephrosis. The possibility of an infiltrating mass deserves consideration. This can be further evaluated with CT of the abdomen and pelvis with contrast. Musculoskeletal: No acute bony abnormality is noted. Review of the MIP images confirms the above findings. IMPRESSION: No evidence of pulmonary emboli. Bilateral pleural effusions and lower lobe consolidation similar to that seen on recent chest x-ray. This is consistent with bibasilar pneumonia and reactive effusions. Diffuse wall thickening throughout the esophagus likely related to reflux esophagitis. Clinical correlation is recommended. It should be noted this is stable from a prior exam of 2019. Enlargement of the left kidney with decreased attenuation identified. Although this may simply represent hydronephrosis possibility of an underlying infiltrative mass deserves consideration. CT of the abdomen and pelvis with contrast is recommended. 17 mm left thyroid nodule. This is roughly stable from the prior CT examination from 2019 and likely benign. Recommend thyroid  US (ref: J Am Coll Radiol. 2015 Feb;12(2): 143-50). Electronically Signed   By: Inez Catalina M.D.   On: 05/11/2020 00:00   CT ABDOMEN PELVIS W CONTRAST  Result Date: 05/13/2020 CLINICAL DATA:  LEFT renal mass. Suspicion for renal mass on prior chest CT. EXAM: CT ABDOMEN AND PELVIS WITH CONTRAST TECHNIQUE: Multidetector CT imaging of the abdomen and pelvis was performed using the standard protocol following bolus administration of intravenous contrast. CONTRAST:  148mL OMNIPAQUE IOHEXOL 300 MG/ML  SOLN COMPARISON:  Chest CT of May 10, 2020 FINDINGS: Lower chest: Loculated bilateral basilar effusions with similar appearance when compared to the study of May 10, 2020. Basilar airspace disease with increasing airspace disease particularly at the LEFT lung base. Hiatal hernia and patulous distal esophagus. Calcified coronary artery disease partially imaged. No pericardial effusion. Hepatobiliary: Liver without focal, suspicious lesion. Portal vein is patent. No pericholecystic stranding. No biliary duct dilation. Pancreas: Pancreas is normal without peripancreatic stranding, ductal dilation or inflammation. Spleen: Spleen normal in size and contour. Adrenals/Urinary Tract: Adrenal glands are normal. Moderate to marked LEFT-sided hydroureteronephrosis with overlying cortical thinning. Large calculus in the proximal LEFT ureter measuring 9 x 8 x 8 mm. No perinephric abscess. Urinary bladder is normal. Normal enhancement of the RIGHT kidney. Enhancement of the kidneys despite scarring and dilation is relatively symmetric and there is an additional small calculus to moderate sized calculus in the lower pole the LEFT kidney. Area with limited assessment due to motion artifact. Stomach/Bowel: Hiatal hernia and some distal esophageal thickening as described. Small duodenal diverticulum in the second portion of the duodenum. No acute gastrointestinal process. Appendix not visualized. Motion artifact in this area limiting  assessment but no gross findings of inflammation that would indicate pericecal stranding or secondary signs of acute appendicitis. Colonic interposition along the anterior portion of the liver. No pericolonic stranding. No perirectal stranding. Vascular/Lymphatic: Calcific and noncalcific atheromatous plaque of the abdominal aorta. No aneurysmal dilation. No adenopathy in the retroperitoneum or upper abdomen. No pelvic lymphadenopathy. Reproductive: Heterogeneous prostate. Calcifications of the vas deferens. Other: No ascites. Musculoskeletal: No acute or destructive bone finding. Spinal degenerative changes. IMPRESSION: 1. Moderate to marked hydronephrosis due to calculus in the proximal to mid LEFT ureter. Urologic consultation and correlation with urine studies to exclude concomitant infection is suggested. 2. Cortical thinning that is seen on the LEFT and preservation of renal enhancement suggests that an element of this process could potentially be chronic. 3. Loculated effusions and worsening of LEFT lower lobe airspace disease that may represent a combination of volume loss and pneumonitis 4. Loculated bilateral basilar effusions with similar appearance when compared to the study of May 10, 2020. Basilar airspace disease with increasing airspace disease particularly at the LEFT lung base. Findings may represent worsening pneumonia or aspiration. 5. Hiatal hernia and patulous distal esophagus. 6. Aortic atherosclerosis. Aortic Atherosclerosis (ICD10-I70.0). Electronically Signed   By: Zetta Bills M.D.   On: 05/13/2020 12:52   DG CHEST PORT 1 VIEW  Result Date: 06/04/2020 CLINICAL DATA:  Pneumonia EXAM: PORTABLE CHEST 1 VIEW COMPARISON:  Chest radiograph and chest CT June 02, 2020 FINDINGS: Esophageal stent again noted, unchanged in position. Chest tube present on the right. No evident pneumothorax. There is a small right pleural effusion with airspace opacity in the right mid and lower lung regions.  Left lung is clear. Heart is borderline enlarged with pulmonary vascularity normal. No adenopathy. No bone lesions. IMPRESSION: Persistent small right pleural effusion with airspace opacity, likely combination of atelectasis and pneumonia, in the right mid and lower lung regions. Left lung clear. Stable cardiac silhouette. Stable positioning of esophageal stent. Right chest tube present without evident pneumothorax. Electronically Signed  By: Lowella Grip III M.D.   On: 06/04/2020 08:13   DG CHEST PORT 1 VIEW  Result Date: 06/02/2020 CLINICAL DATA:  Tachypnea EXAM: PORTABLE CHEST 1 VIEW COMPARISON:  June 01, 2020 FINDINGS: An esophageal stent is again identified. Stable right chest tube. No pneumothorax. Opacity and effusion remain in the right base. No other interval changes. IMPRESSION: 1. Small right effusion underlying opacity. The opacity could represent atelectasis or infiltrate. No other changes. Electronically Signed   By: Dorise Bullion III M.D   On: 06/02/2020 12:41   DG CHEST PORT 1 VIEW  Result Date: 06/01/2020 CLINICAL DATA:  Chest tube. Shortness of breath. EXAM: PORTABLE CHEST 1 VIEW COMPARISON:  May 29, 2020 FINDINGS: Since the prior study the right apical chest tube seen on the prior study has been removed. The additional right-sided chest tube seen on the prior exam is stable in position. Persistent moderate to marked severity right basilar atelectasis and/or infiltrate is noted. There is a small right pleural effusion. A small amount of right apical pleural fluid is also suspected. No residual pneumothorax is identified. The cardiac silhouette is mildly enlarged. A radiopaque stent is seen overlying the mid and distal esophagus. The visualized skeletal structures are unremarkable. IMPRESSION: 1. Interval right apical chest tube removal without evidence of a residual pneumothorax. 2. Persistent moderate to marked severity right basilar atelectasis and/or infiltrate. 3. Small  right pleural effusion. Electronically Signed   By: Virgina Norfolk M.D.   On: 06/01/2020 16:11   DG Chest Port 1 View  Result Date: 05/29/2020 CLINICAL DATA:  Chest tube.  Shortness of breath. EXAM: PORTABLE CHEST 1 VIEW COMPARISON:  05/28/2020. FINDINGS: Two right chest tubes in stable position. Esophageal stent in stable position. Stable cardiomegaly. Progressive dense right base atelectasis/infiltrate. Persistent right mid lung field infiltrate. Small right pleural effusion, improved from prior exam. No pneumothorax. IMPRESSION: 1. Two right chest tubes in stable position. Esophageal stent stable position. No pneumothorax. 2.  Stable cardiomegaly. 3. Progressive dense right base atelectasis/infiltrate. Persistent right mid lung field infiltrate. 3.  Small right pleural effusion, improved from prior exam. Electronically Signed   By: Marcello Moores  Register   On: 05/29/2020 07:38   DG Chest Port 1 View  Result Date: 05/28/2020 CLINICAL DATA:  Pleural effusion, chest tube EXAM: PORTABLE CHEST 1 VIEW COMPARISON:  05/27/2020 FINDINGS: There are two right-sided chest tubes. Esophageal stent is again noted. Improved aeration of the right lung with persistent but decreased right pleural effusion. No pneumothorax. Cardiomediastinal contours are partially obscured IMPRESSION: Improved aeration of right lung since 05/27/2020 with persistent but decreased right pleural effusion. Electronically Signed   By: Macy Mis M.D.   On: 05/28/2020 09:53   DG Chest Port 1 View  Result Date: 05/27/2020 CLINICAL DATA:  Shortness of breath EXAM: PORTABLE CHEST 1 VIEW COMPARISON:  05/26/2020 FINDINGS: Cardiac shadow is stable. Esophageal stent is again noted and stable. Two right-sided chest tubes are seen in satisfactory position. The overall appearance is stable. Persistent opacification of the right hemithorax is again noted similar to that seen on recent plain film evaluation. No new focal abnormality is noted. IMPRESSION:  Stable appearance of the chest with consolidation in the right hemithorax. Electronically Signed   By: Inez Catalina M.D.   On: 05/27/2020 07:22   DG Chest Port 1 View  Result Date: 05/25/2020 CLINICAL DATA:  Leak EXAM: PORTABLE CHEST 1 VIEW COMPARISON:  May 23, 2020 FINDINGS: The cardiomediastinal silhouette is unchanged in contour.RIGHT-sided chest tubes. Moderate  RIGHT pleural effusion. Esophageal stent projects over the mediastinum inferiorly. No pneumothorax. Persistent RIGHT-sided heterogeneous opacities are not significantly changed in comparison to prior. Minimal opacities of the LEFT lung. No acute osseous abnormality. IMPRESSION: Esophageal stent projects over the inferior mediastinum. Electronically Signed   By: Valentino Saxon MD   On: 05/25/2020 11:57   DG CHEST PORT 1 VIEW  Result Date: 05/22/2020 CLINICAL DATA:  59 year old male with perforated esophagus status post surgical exploration, esophageal stent placement and percutaneous gastrostomy tube placement postoperative day 5. Shortness of breath. EXAM: PORTABLE CHEST 1 VIEW COMPARISON:  Portable chest 0535 hours today and earlier. FINDINGS: Portable AP semi upright view at 2330 hours. Stable mediastinal contours. Esophageal stent configuration appears stable since 05/21/2020. But there is increasing confluent right lung base opacity. A single right right chest tube remains in place. No pneumothorax identified. The left lung remains clear allowing for portable technique. Visualized tracheal air column is within normal limits. IMPRESSION: 1. Increasing right lung base opacity since yesterday, indeterminate for pleural fluid versus consolidation. 2. Single right chest tube remains in place, no pneumothorax. 3. Esophageal stent configuration appears stable. Electronically Signed   By: Genevie Ann M.D.   On: 05/22/2020 23:39   DG CHEST PORT 1 VIEW  Result Date: 05/22/2020 CLINICAL DATA:  Follow-up pneumothorax EXAM: PORTABLE CHEST 1 VIEW  COMPARISON:  05/21/2020 FINDINGS: Cardiac shadow is stable. Esophageal stent is again noted and stable. Right thoracostomy catheter is seen without evidence of pneumothorax. Right basilar atelectasis and small effusion are seen. The left lung is clear. IMPRESSION: No pneumothorax is noted. Small right pleural effusion and basilar atelectasis. Electronically Signed   By: Inez Catalina M.D.   On: 05/22/2020 08:03   DG Chest Port 1 View  Result Date: 05/21/2020 CLINICAL DATA:  Postop from repair of esophageal rupture. EXAM: PORTABLE CHEST 1 VIEW COMPARISON:  05/20/2020 FINDINGS: An endoluminal stent is seen within the thoracic esophagus. 2 right-sided chest tubes remain in place. Small approximately 10% right pneumothorax is noted, which appears slightly increased in size since previous study. Bibasilar atelectasis is again seen, right side greater than left, without significant change. Mild cardiomegaly remains stable. IMPRESSION: Small approximately 10% right pneumothorax, slightly increased in size since prior study. Stable bibasilar atelectasis, right side greater than left. Electronically Signed   By: Marlaine Hind M.D.   On: 05/21/2020 08:07   DG Chest Port 1 View  Result Date: 05/20/2020 CLINICAL DATA:  Shortness of breath. RIGHT-sided chest tube present. EXAM: PORTABLE CHEST 1 VIEW COMPARISON:  05/19/2020 FINDINGS: Patient has dual RIGHT-sided chest tubes, unchanged in position. There is no pneumothorax. There is persistent RIGHT pleural effusion and significant opacity at the RIGHT lung base which partially obscures the hemidiaphragm. Subsegmental atelectasis at the LEFT lung base. Stable appearance of esophageal stent. IMPRESSION: 1. Stable appearance of the chest. 2. No pneumothorax. 3. Persistent RIGHT basilar opacity and pleural effusion. Electronically Signed   By: Nolon Nations M.D.   On: 05/20/2020 09:51   DG CHEST PORT 1 VIEW  Result Date: 05/19/2020 CLINICAL DATA:  Empyema EXAM: PORTABLE  CHEST 1 VIEW COMPARISON:  05/18/2020 FINDINGS: Two right apical chest tubes.  No pneumothorax is seen. Patchy right mid/lower lung opacities, reflecting a combination of pneumonia and known complex right pleural fluid collection/empyema. Mild patchy opacity in the left mid/lower lung, likely reflecting pneumonia. Stable esophageal stent. Cardiomegaly. IMPRESSION: Two right apical chest tubes.  No pneumothorax is seen. Multifocal pneumonia, grossly unchanged. Superimposed complex right pleural fluid  collection/empyema. Esophageal stent. Electronically Signed   By: Julian Hy M.D.   On: 05/19/2020 07:12   DG CHEST PORT 1 VIEW  Result Date: 05/18/2020 CLINICAL DATA:  Esophageal perforation. EXAM: PORTABLE CHEST 1 VIEW COMPARISON:  05/17/2020. FINDINGS: Esophageal stent stable position. Two right chest tubes in stable position. Stable cardiomegaly. No pulmonary venous congestion. Low lung volumes with bibasilar atelectasis. Bilateral pulmonary infiltrates/edema again noted. Interim slight increase in right-sided pleural effusion. No pneumothorax noted on today's exam. IMPRESSION: 1. Esophageal stent and 2 right chest tubes noted in stable position. No pneumothorax noted on today's exam. Interim slight increase in right pleural effusion. 2. Low lung volumes with bibasilar atelectasis. Persistent bilateral pulmonary infiltrates/edema without interim change. 3.  Stable cardiomegaly.  No pulmonary venous congestion Electronically Signed   By: Marcello Moores  Register   On: 05/18/2020 05:41   DG Chest Port 1 View  Result Date: 05/17/2020 CLINICAL DATA:  Esophageal stent placement. EXAM: PORTABLE CHEST 1 VIEW COMPARISON:  Chest CT yesterday. FINDINGS: Interval placement of esophageal stent. Two right chest tubes in place. Decreased right hydropneumothorax. Persistent pleural fluid in extrapleural air at the right lung base. Patchy opacity in the right mid lower lung zone. Small left pleural effusion and basilar  opacities. Heart size grossly stable. There is contrast within the stomach. IMPRESSION: 1. Interval placement of esophageal stent. 2. Two right chest tubes in place with decreased right hydropneumothorax. Small volume of residual pleural fluid in extrapleural air at the right lung base. 3. Patchy opacity in the right mid and lower lung zone, may represent atelectasis or re-expansion pulmonary edema. 4. Small left pleural effusion and basilar opacities. Electronically Signed   By: Keith Rake M.D.   On: 05/17/2020 19:57   DG CHEST PORT 1 VIEW  Result Date: 05/16/2020 CLINICAL DATA:  Chest tube insertion for pneumothorax. EXAM: PORTABLE CHEST 1 VIEW COMPARISON:  May 16, 2020 FINDINGS: A right-sided chest tube is noted. Its distal end is kinked and is seen overlying the lateral aspect of the right upper quadrant. A large, stable right-sided hydropneumothorax is seen with subsequent collapse of the right lung. This is unchanged in appearance when compared to the prior study. Stable mild to moderate severity atelectasis and/or infiltrate is noted within the left lung base. The heart size and mediastinal contours are within normal limits. The visualized skeletal structures are unremarkable. IMPRESSION: 1. Stable large right-sided hydropneumothorax with subsequent collapse of the right lung. 2. Stable mild to moderate severity left basilar atelectasis and/or infiltrate. 3. Right-sided chest tube with its distal end kinked and seen overlying the right upper quadrant. Electronically Signed   By: Virgina Norfolk M.D.   On: 05/16/2020 16:52   DG CHEST PORT 1 VIEW  Result Date: 05/16/2020 CLINICAL DATA:  Pneumothorax post recent thoracentesis, shortness of breath EXAM: PORTABLE CHEST 1 VIEW COMPARISON:  Portable exam 0849 hours compared to 05/14/2020 FINDINGS: Stable heart size and mediastinal contours. RIGHT hydropneumothorax, with increased collapse of RIGHT lung since previous exam. Minimal LEFT pleural  effusion with atelectasis versus infiltrate LEFT lower lobe. LEFT upper lung clear. Bones demineralized. IMPRESSION: RIGHT hydropneumothorax with increased collapse of RIGHT lung since previous exam. Minimal LEFT pleural effusion with atelectasis versus infiltrate at LEFT lower lobe. Findings called to Dr. Elsworth Soho on 05/16/2020 at 0922 hours. Electronically Signed   By: Lavonia Dana M.D.   On: 05/16/2020 09:38   DG Chest Port 1 View  Result Date: 05/10/2020 CLINICAL DATA:  Questionable sepsis.  Pneumonia diagnosis yesterday. EXAM:  PORTABLE CHEST 1 VIEW COMPARISON:  Radiograph yesterday. FINDINGS: Upper normal heart size. Unchanged mediastinal contours. Hazy symmetric bibasilar opacities are unchanged from radiograph yesterday. No pulmonary edema. No pneumothorax. IMPRESSION: Unchanged symmetric bibasilar opacities are unchanged from yesterday, likely combination of pleural effusions and atelectasis/pneumonia. Electronically Signed   By: Keith Rake M.D.   On: 05/10/2020 21:24   DG Chest Port 1 View  Result Date: 05/09/2020 CLINICAL DATA:  Cough and fever. EXAM: PORTABLE CHEST 1 VIEW COMPARISON:  09/06/2018 FINDINGS: Heart size is normal. There are bilateral pleural effusions and infiltrate/atelectasis in both lower lungs, consistent with pneumonia. Upper lungs are clear. IMPRESSION: Bilateral lower lobe atelectasis and pneumonia.  Small effusions. Electronically Signed   By: Nelson Chimes M.D.   On: 05/09/2020 14:16   DG Chest Port 1V same Day  Result Date: 05/14/2020 CLINICAL DATA:  Follow-up right-sided pneumothorax. EXAM: PORTABLE CHEST 1 VIEW COMPARISON:  May 14, 2020 (3:02 p.m.) FINDINGS: Stable moderate to marked severity atelectasis and/or infiltrate is noted within the right lung base with stable mild to moderate severity left basilar atelectasis and/or infiltrate seen. There is a small, stable left pleural effusion with a stable moderate sized right pleural effusion. A stable moderate sized  right apical pneumothorax is seen. A mild amount of radiopaque contrast is again seen along the infrahilar region on the right. The heart size and mediastinal contours are within normal limits. The visualized skeletal structures are unremarkable. IMPRESSION: No significant interval change when compared to the prior study dated May 14, 2020 (3:02 p.m.) Electronically Signed   By: Virgina Norfolk M.D.   On: 05/14/2020 19:27   DG Swallowing Func-Speech Pathology  Result Date: 05/14/2020 Objective Swallowing Evaluation: Type of Study: MBS-Modified Barium Swallow Study  Patient Details Name: Jeriko Kowalke MRN: [id number] Date of Birth: 01-06-1961 Today's Date: 05/14/2020 Time: SLP Start Time (ACUTE ONLY): 50 -SLP Stop Time (ACUTE ONLY): 1300 SLP Time Calculation (min) (ACUTE ONLY): 30 min Past Medical History: Past Medical History: Diagnosis Date . Cocaine abuse ([place])  . ETOH abuse  . GERD (gastroesophageal reflux disease)  . Hyperlipidemia  . 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 BIOPSY  08/25/2018  Procedure: BIOPSY;  Surgeon: [name], MD;  [facility];  Service: Endoscopy;; . BIOPSY  12/13/2018  Procedure: BIOPSY;  Surgeon: [name], MD;  Location: [facility];  Service: Endoscopy;;  esophagus . COLONOSCOPY WITH PROPOFOL N/A 04/07/2019  Dr. [name]: 3 Tubular adenomas removed (4 to 8 mm in size), next colonoscopy 3 years. . ESOPHAGOGASTRODUODENOSCOPY (EGD) WITH PROPOFOL N/A 08/25/2018  Dr. [name]: Severely inflamed abnormal appearing mid/distal esophagus.  Encroachment on lumen suspicious for infiltrating neoplasm located be all from severe benign inflammation, extensive gastric erosions and extensive duodenal ulcerations.  Query ischemic process versus other.  Biopsy revealed Barrett's esophagus but negative for malignancy and H. pylori. . ESOPHAGOGASTRODUODENOSCOPY (EGD) WITH PROPOFOL N/A  12/13/2018  Dr. [name]: peptic stricture s/p balloon dilatation. abnormal esophageal mucosa, by c/w barrett's without dysplasia, previous PUD completely healed.  Marland Kitchen POLYPECTOMY  04/07/2019  Procedure: POLYPECTOMY;  Surgeon: [name], MD;  [facility]: [facility];  Service: Endoscopy;;  colon HPI: 59 y.o. male with medical history significant for prior stroke, hyperlipidemia, GERD, BPH, barrett's esophagus    and remote history of cocaine abuse who presents to the emergency department due to 5 days of weakness, complains of productive cough with clear phlegm with  occasional black speckles with associated reproducible midsternal chest pain. Patient was seen in the ED 2 days ago (7/28) due to similar symptoms, during which he reported a max temperature of 100F.  Chest x-ray done at that time showed pneumonia and patient was treated with IV fluid.  Amoxicillin and doxycycline were prescribed on the was advised to follow-up with his primary care physician.  Patient returned to the ED yesterday due to increased heart rate and sob.  He states that he was taking his blood pressure medication as prescribed and denies cocaine use. Bilateral pleural effusions and lower lobe consolidation similar to that seen on recent chest x-ray. This is consistent with bibasilar pneumonia and reactive effusions. Diffuse wall thickening throughout the esophagus likely related to reflux esophagitis. Clinical correlation is recommended. It should be noted this is stable from a prior exam of 2019.  Subjective: "My chest hurts when I am coughing sometimes." Assessment / Plan / Recommendation CHL IP CLINICAL IMPRESSIONS 05/14/2020 Clinical Impression Pt presents with mild oropharyngeal phase dysphagia characterized by lingual pumping with reduced bolus cohesiveness and premature spillage of liquids to the valleculae. Hyolaryngeal excursion appeared WNL. Pt without penetration/aspiration across consistencies/textures and only with trace amounts of  thin liquid residuals along base of tongue and in lateral channels which cleared with secondary swallow. Pt is currently on 4L O2 via nasal cannula. Risk for aspiration with po intake appears minimal, however his respiratory status slightly increases his risk. Pt also with history of esophageal dysphagia. Recommend regular textures and thin liquids with standard aspiration and reflux precautions. No further SLP services indicated at this time. This study was reviewed with Dr. Elsworth Soho (pulmonary).  SLP Visit Diagnosis Dysphagia, oropharyngeal phase (R13.12) Attention and concentration deficit following -- Frontal lobe and executive function deficit following -- Impact on safety and function Mild aspiration risk   CHL IP TREATMENT RECOMMENDATION 05/14/2020 Treatment Recommendations No treatment recommended at this time   Prognosis 05/14/2020 Prognosis for Safe Diet Advancement Good Barriers to Reach Goals -- Barriers/Prognosis Comment -- CHL IP DIET RECOMMENDATION 05/14/2020 SLP Diet Recommendations Regular solids;Thin liquid Liquid Administration via Cup;Straw Medication Administration Whole meds with liquid Compensations Slow rate;Small sips/bites Postural Changes Seated upright at 90 degrees;Remain semi-upright after after feeds/meals (Comment)   CHL IP OTHER RECOMMENDATIONS 05/14/2020 Recommended Consults -- Oral Care Recommendations Oral care BID Other Recommendations Clarify dietary restrictions   CHL IP FOLLOW UP RECOMMENDATIONS 05/14/2020 Follow up Recommendations None   CHL IP FREQUENCY AND DURATION 05/14/2020 Speech Therapy Frequency (ACUTE ONLY) min 2x/week Treatment Duration 1 week      CHL IP ORAL PHASE 05/14/2020 Oral Phase Impaired Oral - Pudding Teaspoon -- Oral - Pudding Cup -- Oral - Honey Teaspoon -- Oral - Honey Cup -- Oral - Nectar Teaspoon -- Oral - Nectar Cup -- Oral - Nectar Straw -- Oral - Thin Teaspoon Premature spillage Oral - Thin Cup Premature spillage;Decreased bolus cohesion Oral - Thin Straw Premature  spillage Oral - Puree Lingual pumping Oral - Mech Soft -- Oral - Regular Delayed oral transit Oral - Multi-Consistency -- Oral - Pill -- Oral Phase - Comment --  CHL IP PHARYNGEAL PHASE 05/14/2020 Pharyngeal Phase Impaired Pharyngeal- Pudding Teaspoon -- Pharyngeal -- Pharyngeal- Pudding Cup -- Pharyngeal -- Pharyngeal- Honey Teaspoon -- Pharyngeal -- Pharyngeal- Honey Cup -- Pharyngeal -- Pharyngeal- Nectar Teaspoon -- Pharyngeal -- Pharyngeal- Nectar Cup -- Pharyngeal -- Pharyngeal- Nectar Straw -- Pharyngeal -- Pharyngeal- Thin Teaspoon Delayed swallow initiation-vallecula Pharyngeal -- Pharyngeal- Thin Cup Delayed swallow initiation-vallecula;Lateral  channel residue Pharyngeal -- Pharyngeal- Thin Straw Lateral channel residue;Delayed swallow initiation-vallecula Pharyngeal -- Pharyngeal- Puree Delayed swallow initiation-vallecula;WFL Pharyngeal -- Pharyngeal- Mechanical Soft -- Pharyngeal -- Pharyngeal- Regular WFL Pharyngeal -- Pharyngeal- Multi-consistency -- Pharyngeal -- Pharyngeal- Pill WFL Pharyngeal -- Pharyngeal Comment --  CHL IP CERVICAL ESOPHAGEAL PHASE 05/14/2020 Cervical Esophageal Phase WFL Pudding Teaspoon -- Pudding Cup -- Honey Teaspoon -- Honey Cup -- Nectar Teaspoon -- Nectar Cup -- Nectar Straw -- Thin Teaspoon -- Thin Cup -- Thin Straw -- Puree -- Mechanical Soft -- Regular -- Multi-consistency -- Pill -- Cervical Esophageal Comment -- Thank you, Genene Churn, [place] PORTER,DABNEY 05/14/2020, 2:21 PM              DG C-Arm 1-60 Min  Result Date: 05/25/2020 CLINICAL DATA:  Esophageal stent placement. EXAM: DG C-ARM 1-60 MIN; CHEST  1 VIEW FLUOROSCOPY TIME:  Fluoro time: 4 Minutes 17 secondsRadiation exposure index: MGy: 41.69Number of acquired spot images: Five spot fluoro graphic images. COMPARISON:  May 17, 2020.  May 23, 2020 FINDINGS: Spot fluoroscopy images were obtained for surgical planning purposes. Esophageal stent is seen traversing the thorax. Enteric contrast  traverses the stent and enters the proximal stomach. None of the provided radiographs demonstrate evaluation of the site of previous leak into the RIGHT pleural space. Selective LEFT mainstem intubation. IMPRESSION: Spot fluoroscopy images from surgical planning purposes. Please reference procedure report for further details. Electronically Signed   By: Valentino Saxon MD   On: 05/25/2020 10:59   DG C-Arm 1-60 Min  Result Date: 05/17/2020 CLINICAL DATA:  Surgery. Esophageal stent placement. EXAM: DG C-ARM 1-60 MIN; CHEST  1 VIEW FLUOROSCOPY TIME:  Fluoroscopy Time:  5 minutes 48 seconds Radiation Exposure Index (if provided by the fluoroscopic device): 59.06 mGy Number of Acquired Spot Images: 3 COMPARISON:  Preprocedural CT yesterday. FINDINGS: Three fluoroscopic spot views obtained in the operating room. Endoscope is in place. A stent overlies the endoscope with tip projecting over the esophageal diaphragmatic hiatus. Subsequent enteric tube courses in the stent. There is contrast opacifying the distal stent on image 1. Right chest tube is in place. IMPRESSION: Intraoperative fluoroscopy during esophageal stent placement. Please reference procedural report for details. Electronically Signed   By: Keith Rake M.D.   On: 05/17/2020 19:55   ECHOCARDIOGRAM COMPLETE  Result Date: 05/11/2020    ECHOCARDIOGRAM REPORT   Patient Name:   XABI WITTLER Istre Date of Exam: 05/11/2020 Medical Rec #:  [id number]           Height:       69.0 in Accession #:    [id number]          Weight:       153.0 lb Date of Birth:  03-19-1961           BSA:          1.844 m Patient Age:    52 years            BP:           141/90 mmHg Patient Gender: M                   HR:           100 bpm. Exam Location:  [facility] Procedure: 2D Echo, Cardiac Doppler and Color Doppler Indications:    CHF  History:        Patient has prior history of Echocardiogram examinations, most  recent 07/13/2018. CHF, Stroke,  Signs/Symptoms:Shortness of                 Breath; Risk Factors:Hypertension and Dyslipidemia. Cocaine                 abuse, Pneumonia.  Sonographer:    Dustin Flock RDCS Referring Phys: [id number] [place]  1. Left ventricular ejection fraction, by estimation, is 65 to 70%. The left ventricle has normal function. The left ventricle has no regional wall motion abnormalities. Left ventricular diastolic parameters are consistent with Grade I diastolic dysfunction (impaired relaxation).  2. Right ventricular systolic function is normal. The right ventricular size is normal. Tricuspid regurgitation signal is inadequate for assessing PA pressure.  3. The mitral valve is grossly normal. Trivial mitral valve regurgitation.  4. The aortic valve is tricuspid. Aortic valve regurgitation is not visualized.  5. The inferior vena cava is normal in size with greater than 50% respiratory variability, suggesting right atrial pressure of 3 mmHg. FINDINGS  Left Ventricle: Left ventricular ejection fraction, by estimation, is 65 to 70%. The left ventricle has normal function. The left ventricle has no regional wall motion abnormalities. The left ventricular internal cavity size was normal in size. There is  no left ventricular hypertrophy. Left ventricular diastolic parameters are consistent with Grade I diastolic dysfunction (impaired relaxation). Right Ventricle: The right ventricular size is normal. No increase in right ventricular wall thickness. Right ventricular systolic function is normal. Tricuspid regurgitation signal is inadequate for assessing PA pressure. Left Atrium: Left atrial size was normal in size. Right Atrium: Right atrial size was normal in size. Pericardium: There is no evidence of pericardial effusion. Mitral Valve: The mitral valve is grossly normal. Trivial mitral valve regurgitation. Tricuspid Valve: The tricuspid valve is grossly normal. Tricuspid valve regurgitation is mild. Aortic  Valve: The aortic valve is tricuspid. Aortic valve regurgitation is not visualized. Pulmonic Valve: The pulmonic valve was grossly normal. Pulmonic valve regurgitation is trivial. Aorta: The aortic root is normal in size and structure. Venous: The inferior vena cava is normal in size with greater than 50% respiratory variability, suggesting right atrial pressure of 3 mmHg. IAS/Shunts: No atrial level shunt detected by color flow Doppler.  LEFT VENTRICLE PLAX 2D LVIDd:         4.30 cm  Diastology LVIDs:         2.68 cm  LV e' lateral:   6.42 cm/s LV PW:         0.82 cm  LV E/e' lateral: 11.1 LV IVS:        0.98 cm  LV e' medial:    5.11 cm/s LVOT diam:     2.20 cm  LV E/e' medial:  13.9 LV SV:         81 LV SV Index:   44 LVOT Area:     3.80 cm  RIGHT VENTRICLE RV Basal diam:  2.99 cm RV S prime:     21.60 cm/s TAPSE (M-mode): 3.1 cm LEFT ATRIUM             Index       RIGHT ATRIUM           Index LA diam:        3.50 cm 1.90 cm/m  RA Area:     14.20 cm LA Vol (A2C):   42.8 ml 23.21 ml/m RA Volume:   31.20 ml  16.92 ml/m LA Vol (A4C):   46.7 ml 25.33 ml/m LA  Biplane Vol: 45.6 ml 24.73 ml/m  AORTIC VALVE LVOT Vmax:   124.00 cm/s LVOT Vmean:  72.900 cm/s LVOT VTI:    0.212 m  AORTA Ao Root diam: 3.00 cm MITRAL VALVE MV Area (PHT): 6.65 cm     SHUNTS MV Decel Time: 114 msec     Systemic VTI:  0.21 m MV E velocity: 71.10 cm/s   Systemic Diam: 2.20 cm MV A velocity: 113.00 cm/s MV E/A ratio:  0.63 Rozann Lesches MD Electronically signed by Rozann Lesches MD Signature Date/Time: 05/11/2020/4:35:39 PM    Final    DG ESOPHAGUS W SINGLE CM (SOL OR THIN BA)  Result Date: 05/28/2020 CLINICAL DATA:  Esophageal stent with prior leak, reassessment for leak EXAM: ESOPHOGRAM/BARIUM SWALLOW TECHNIQUE: Single contrast examination was performed using 25 cc Omnipaque 300. FLUOROSCOPY TIME:  Fluoroscopy Time:  3 minutes, 24 seconds Radiation Exposure Index (if provided by the fluoroscopic device): 33 mGy Number of Acquired  Spot Images: 0 COMPARISON:  Multiple exams, including 05/23/2020 FINDINGS: Mid to distal expandable esophageal stent noted. The prior right-sided leak from the distal margin of the stent is no longer present. Contrast medium extending around the distal margin of the stent primarily on the left side appears to be intraluminal and likely in between the stent and the wall of the distal esophagus or small hiatal hernia based on the contained appearance for example on image 26/8. This collection was noted to extend proximally along the distal half of the stent along the left side. This is not unexpected given the appearance on the chest CT of 05/24/2020, which showed some luminal gas tracking along the left side of the stent in this vicinity. Accordingly, no leak from the esophagus is currently identified. IMPRESSION: 1. No esophageal leak is currently identified. 2. A small amount of intraluminal contrast extends around the stent distally, but this is felt to be due to incomplete apposition of the stent with the lumen, rather than a leak. Electronically Signed   By: Van Clines M.D.   On: 05/28/2020 08:29   DG ESOPHAGUS W SINGLE CM (SOL OR THIN BA)  Result Date: 05/23/2020 CLINICAL DATA:  Postop esophageal perforation 05/17/2020. EXAM: ESOPHOGRAM/BARIUM SWALLOW TECHNIQUE: Single contrast examination was performed using  water-soluble. FLUOROSCOPY TIME:  Fluoroscopy Time:  1 minutes 36 seconds Radiation Exposure Index (if provided by the fluoroscopic device): Number of Acquired Spot Images: 5 COMPARISON:  CT chest 05/16/2020. FINDINGS: In the recumbent LPO position, patient drank water soluble Omnipaque 300. There is a long esophageal wall stent centered in the mid esophagus. Leakage of contrast from the right posterolateral wall of the distal esophagus, in the region of the distal aspect of the wall stent, is seen with filling of the right pleural space. IMPRESSION: Persistent leak from the right  posterolateral aspect of the esophagus, along the distal aspect of the stent, with filling of the adjacent right pleural space. Electronically Signed   By: Lorin Picket M.D.   On: 05/23/2020 10:05   US THORACENTESIS ASP PLEURAL SPACE W/IMG GUIDE  Result Date: 05/14/2020 INDICATION: Complicated RIGHT pleural effusion EXAM: ULTRASOUND GUIDED DIAGNOSTIC RIGHT THORACENTESIS MEDICATIONS: None. COMPLICATIONS: Probable RIGHT pneumothorax, see chest radiograph report PROCEDURE: An ultrasound guided thoracentesis was thoroughly discussed with the patient and questions answered. The benefits, risks, alternatives and complications were also discussed. The patient understands and wishes to proceed with the procedure. Written consent was obtained. Ultrasound was performed to localize and mark an adequate pocket of fluid in the posterior RIGHT  chest. The area was then prepped and draped in the normal sterile fashion. 1% Lidocaine was used for local anesthesia. Under ultrasound guidance a 5 [place] Yueh catheter was introduced. Thoracentesis was performed. The catheter was removed and a dressing applied. FINDINGS: A total of approximately 30 mL of cloudy yellow slightly thicker RIGHT pleural fluid was removed. Samples were sent to the laboratory as requested by the clinical team. IMPRESSION: Successful ultrasound guided diagnostic RIGHT thoracentesis yielding 30 mL of cloudy yellow RIGHT pleural fluid, cannot exclude infection/empyema with this appearance. Findings discussed with Dr. Elsworth Soho on 05/14/2020 at 1545 hours. Electronically Signed   By: Lavonia Dana M.D.   On: 05/14/2020 15:50     Time Spent in minutes  30     Desiree Hane M.D on 06/05/2020 at 1:27 PM  To page go to [email or web address] - password [facility]

## 2020-06-05 NOTE — Progress Notes (Signed)
Nutrition Follow-up  DOCUMENTATION CODES:   Non-severe (moderate) malnutrition in context of chronic illness  INTERVENTION:   Continue tube feeding via PEG: - Increase Osmolite 1.5 to 60 ml/hr (1440 ml/day) - ProSource TF 45 ml BID - Add free water flushes of 150 ml q 4 hours  Tube feeding regimenwith free water flushes provide2240kcal, 112grams of protein, and 1912m of H2O.  - Continue Boost Breeze po BID, each supplement provides 250 kcal and 9 grams of protein  NUTRITION DIAGNOSIS:   Moderate Malnutrition related to chronic illness (Barrett's esophagus, GERD) as evidenced by moderate fat depletion, moderate muscle depletion.  Ongoing  GOAL:   Patient will meet greater than or equal to 90% of their needs  Met via TF  MONITOR:   Diet advancement, Labs, Weight trends, TF tolerance, Skin, I & O's  REASON FOR ASSESSMENT:   Consult Enteral/tube feeding initiation and management  ASSESSMENT:   59year old male who presented on 7/28 with fever, SOB. PMH of stroke, HLD, GERD, Barrett's esophagus, PUD, s/p balloon stricturoplasty for GE junction in 2020. Admitted with severe sepsis secondary to bilateral pleural effusions with concomitant bibasilar pneumonia.  8/02 - MBSS with recommendation for regular diet with thin liquids, s/p thoracentesis 8/04 - s/p chest tube insertion, chest CT showing esophageal perforation 8/05 - s/p VATS, esophageal stent placement, PEG tube placement 8/13 - s/pright thoracoscopy, drainage of loculated pleural effusion, esophagogastroscopy, esophageal stent placement, esophagram with Omnipaque 8/16 - swallow study showing no leak 8/19 - diet advanced to clear liquids  Per notes, pt is not swallowing any of his clear liquids and still having significant oral secretions. SLP to reevaluate. Noted SLP recommending FEES for instrumental assessment of swallowing and to examine oropharynx for source of pain.  RD consulted to add free water  flushes.  Pt with a 7.7 kg weight loss since admit (05/15/20). This is an 11.4% weight loss in less than 1 month which is significant for timeframe. RD to increase TF regimen. Discussed with RN.  Spoke with pt at bedside. Pt in recliner with clear liquid tray. Pt reports that he has not yet had anything from his tray but that he plans to. Pt reports still having pain with swallowing.  Current TF: Osmolite 1.5 @ 55 ml/hr, ProSource TF 45 ml BID  Medications reviewed and include: dulcolax, Boost Breeze BID, SSI, protonix, senokot, IV abx IVF: NS @ 75 ml/hr  Labs reviewed: sodium 134, elevated LFTs, hemoglobin 8.4 CBG's: 135-176 x 24 hours  JP drain: 2.5 ml x 24 hours I/O's: +4.7 L since admit  Diet Order:   Diet Order            Diet clear liquid Room service appropriate? Yes; Fluid consistency: Thin  Diet effective now                 EDUCATION NEEDS:   Education needs have been addressed  Skin:  Skin Assessment: Skin Integrity Issues: Stage II: R buttocks Incisions: right chest Other: skin tear to L buttocks  Last BM:  06/01/20  Height:   Ht Readings from Last 1 Encounters:  05/17/20 _0  (1.753 m)    Weight:   Wt Readings from Last 1 Encounters:  06/05/20 59.8 kg    Ideal Body Weight:  72.7 kg  BMI:  Body mass index is 19.47 kg/m.  Estimated Nutritional Needs:   Kcal:  2000-2200  Protein:  100-115 grams  Fluid:  >/= 2.0 L    KGaynell Face MS, RD,  LDN Inpatient Clinical Dietitian Please see AMiON for contact information.

## 2020-06-05 NOTE — Progress Notes (Signed)
Inpatient Rehabilitation Admissions Coordinator  I continue to follow pt's progress. I continue to recommend SNF for prolonged rehab recovery.  Danne Baxter, RN, MSN Rehab Admissions Coordinator [phone] 06/05/2020 2:25 PM

## 2020-06-05 NOTE — Progress Notes (Signed)
Pharmacy Antibiotic Note  Evan Chavez is a 59 y.o. male s/p VATs (8/5).   He continues on fluconazole for fungal PNA (started 8/5), also continues on cefepime for HCAP. Vancomycin stopped.  -WBC= 9, SCr= 0.79, tmax= 99.7  Plan: Continue Cefepime 2gm IV q8h Off vancomycin and fluconazole Follow up length of therapy of antibiotics   Height: 5\' 9"  (175.3 cm) Weight: 59.8 kg (131 lb 13.4 oz) IBW/kg (Calculated) : 70.7  Temp (24hrs), Avg:99.1 F (37.3 C), Min:98 F (36.7 C), Max:99.7 F (37.6 C)  Recent Labs  Lab 06/01/20 0019 06/02/20 0038 06/02/20 1524 06/02/20 1635 06/03/20 0121 06/04/20 0118 06/05/20 0106  WBC 15.3* 21.2*  --   --  14.9* 9.9 9.2  CREATININE 0.79 0.90  --   --  0.83 0.86 0.79  LATICACIDVEN  --   --  1.5 1.6  --   --   --     Estimated Creatinine Clearance: 83.1 mL/min (by C-G formula based on SCr of 0.79 mg/dL).    No Known Allergies  Antimicrobials this admission: Vanc 8/12 >>8/18; 8/21>> 8/24 Zosyn 8/3 >>8/18 8/21 Ceftriaxone 7/30>>8/2 Azithromycin 7/30>>8/2 Fluconazole 8/5>>8/24 Cefepime 8/21>>  Microbiology results: 7/29 BCx - negative 7/30 UCx - negative 8/2 pleural fluid - negative 8/4 MRSA PCR - negative 8/5 right chest empyema tissue - candida parap 8/13 empyema: yeast - candida parap - sens to flucon -per ID 8/20 blood x2  Thank you for allowing pharmacy to be a part of this patient's care.  Erin Hearing PharmD., BCPS Clinical Pharmacist 06/05/2020 10:12 AM

## 2020-06-06 ENCOUNTER — Ambulatory Visit: Payer: Medicaid Other | Admitting: Urology

## 2020-06-06 DIAGNOSIS — R0603 Acute respiratory distress: Secondary | ICD-10-CM | POA: Diagnosis present

## 2020-06-06 DIAGNOSIS — Z9889 Other specified postprocedural states: Secondary | ICD-10-CM

## 2020-06-06 LAB — BASIC METABOLIC PANEL
Anion gap: 8 (ref 5–15)
BUN: 13 mg/dL (ref 6–20)
CO2: 22 mmol/L (ref 22–32)
Calcium: 8.4 mg/dL — ABNORMAL LOW (ref 8.9–10.3)
Chloride: 104 mmol/L (ref 98–111)
Creatinine, Ser: 0.66 mg/dL (ref 0.61–1.24)
GFR calc Af Amer: >60 mL/min (ref >60)
GFR calc non Af Amer: >60 mL/min (ref >60)
Glucose, Bld: 152 mg/dL — ABNORMAL HIGH (ref 70–99)
Potassium: 4.4 mmol/L (ref 3.5–5.1)
Sodium: 134 mmol/L — ABNORMAL LOW (ref 135–145)

## 2020-06-06 LAB — GLUCOSE, CAPILLARY
Glucose-Capillary: 139 mg/dL — ABNORMAL HIGH (ref 70–99)
Glucose-Capillary: 158 mg/dL — ABNORMAL HIGH (ref 70–99)
Glucose-Capillary: 119 mg/dL — ABNORMAL HIGH (ref 70–99)
Glucose-Capillary: 122 mg/dL — ABNORMAL HIGH (ref 70–99)

## 2020-06-06 LAB — CULTURE, BLOOD (ROUTINE X 2)
Culture: NO GROWTH
Culture: NO GROWTH
Special Requests: ADEQUATE

## 2020-06-06 NOTE — Progress Notes (Signed)
JP drain and sutures related to the JP drain removed without difficulty.  Covered with a Tagaderm.  Patient tolerated well

## 2020-06-06 NOTE — Progress Notes (Signed)
Physical Therapy Treatment Patient Details Name: Evan Chavez MRN: [id number] DOB: 07-Mar-1961 Today's Date: 06/06/2020    History of Present Illness Pt is 59 yo male presents to the hospital on 7/28 with generalized weakness, productive cough and low-grade fever. Pt is currently being treated for hydropneumothorax, Sepsis aspiration pneumonia and esophagal rupture, AKI and acute delirium. Pt had successful R thoracentesis on 8/2. Pt had a chest tube put in on 8/2 and was removed 8/9. Pt also underwent a VATS, decortication, EGD, PEG tube placement, and a esophageal stent on 8/9. PMH of GERD, Barrett's esophagus, peptic ulcer disease with extensive gastric erosion, duodenal ulcer and esophageal stricture, also with history of prior stroke, hyperlipidemia, BPH, remote to cocaine abuse. Pt returned to OR on 8/13 for Right thoracoscopy, chest tube placement, esophageal stent placement.  Chest tube removed 8/20 and chest xray stable.    PT Comments    Pt making very slow progress. Does what is asked but little engagement. Do not feel he would tolerate intensity of CIR and agree with SNF.    Follow Up Recommendations  SNF     Equipment Recommendations  3in1 (PT);Rolling walker with 5" wheels    Recommendations for Other Services       Precautions / Restrictions Precautions Precautions: Fall Precaution Comments: PEG    Mobility  Bed Mobility Overal bed mobility: Needs Assistance Bed Mobility: Rolling;Sidelying to Sit Rolling: Min guard Sidelying to sit: Min assist;HOB elevated       General bed mobility comments: Assist to elevate trunk into sitting  Transfers Overall transfer level: Needs assistance Equipment used: Rolling walker (2 wheeled) Transfers: Sit to/from Stand Sit to Stand: Min assist         General transfer comment: Assist to bring hips up and for balance  Ambulation/Gait Ambulation/Gait assistance: Min assist Gait Distance (Feet): 15 Feet (15' x 1, 10'  x1) Assistive device: Rolling walker (2 wheeled) Gait Pattern/deviations: Step-to pattern;Decreased step length - right;Decreased step length - left;Shuffle;Trunk flexed Gait velocity: decr Gait velocity interpretation: <1.31 ft/sec, indicative of household ambulator General Gait Details: Assist for balance and support.    Stairs             Wheelchair Mobility    Modified Rankin (Stroke Patients Only)       Balance Overall balance assessment: Needs assistance Sitting-balance support: Feet supported;No upper extremity supported Sitting balance-Leahy Scale: Fair     Standing balance support: During functional activity Standing balance-Leahy Scale: Poor Standing balance comment: walker and min guard for static standing                            Cognition Arousal/Alertness: Awake/alert Behavior During Therapy: Flat affect Overall Cognitive Status: Impaired/Different from baseline Area of Impairment: Following commands;Problem solving                       Following Commands: Follows one step commands consistently;Follows one step commands with increased time     Problem Solving: Slow processing;Decreased initiation;Requires verbal cues General Comments: Pt with very little verbalization      Exercises      General Comments General comments (skin integrity, edema, etc.): HR 100's-110's with activity      Pertinent Vitals/Pain Pain Assessment: Faces Faces Pain Scale: Hurts little more Pain Location: rt side Pain Descriptors / Indicators: Grimacing;Guarding Pain Intervention(s): Limited activity within patient's tolerance    Home Living  Prior Function            PT Goals (current goals can now be found in the care plan section) Acute Rehab PT Goals Patient Stated Goal: get better Progress towards PT goals: Progressing toward goals (very slowly)    Frequency    Min 3X/week      PT Plan  Discharge plan needs to be updated    Co-evaluation              AM-PAC PT "6 Clicks" Mobility   Outcome Measure  Help needed turning from your back to your side while in a flat bed without using bedrails?: A Little Help needed moving from lying on your back to sitting on the side of a flat bed without using bedrails?: A Little Help needed moving to and from a bed to a chair (including a wheelchair)?: A Little Help needed standing up from a chair using your arms (e.g., wheelchair or bedside chair)?: A Little Help needed to walk in hospital room?: A Little Help needed climbing 3-5 steps with a railing? : A Lot 6 Click Score: 17    End of Session   Activity Tolerance: Patient tolerated treatment well Patient left: in chair;with call bell/phone within reach;with chair alarm set Nurse Communication: Mobility status PT Visit Diagnosis: Unsteadiness on feet (R26.81);Muscle weakness (generalized) (M62.81)     Time: 6503-5465 PT Time Calculation (min) (ACUTE ONLY): 22 min  Charges:  $Gait Training: 8-22 mins                     [place] Pager [phone] Office [place] 06/06/2020, 11:03 AM

## 2020-06-06 NOTE — Progress Notes (Addendum)
      [address]       [place], [address]             [phone]      12 Days Post-Op Procedure(s) (LRB): ESOPHAGOGASTRODUODENOSCOPY (EGD); ESOPHAGEAL STENT REMOVAL. (N/A) ESOPHAGEAL STENT PLACEMENT USING A 23 MM COVERED STENT (N/A) VIDEO ASSISTED THORACOSCOPY (VATS)/EMPYEMA (Right) Subjective: Feels okay today. Nods in response to my questions. Stressed the importance of PO trial today.   Objective: Vital signs in last 24 hours: Temp:  [98.6 F (37 C)-99.3 F (37.4 C)] 99.2 F (37.3 C) (08/25 0500) Pulse Rate:  [108-118] 118 (08/24 1900) Cardiac Rhythm: Sinus tachycardia (08/25 0703) Resp:  [21-36] 36 (08/25 0500) BP: (133-147)/(80-91) 147/91 (08/25 0500) SpO2:  [96 %-98 %] 96 % (08/24 1900) Weight:  [60.4 kg] 60.4 kg (08/25 0622)     Intake/Output from previous day: 08/24 0701 - 08/25 0700 In: 2504.5 [I.V.:1086.5; NG/GT:1018; IV Piggyback:400] Out: 1500 [Urine:1500] Intake/Output this shift: No intake/output data recorded.  General appearance: alert, cooperative and no distress Heart: sinus tachycardia Lungs: clear to auscultation bilaterally and diminished in the lower lobes Abdomen: soft, non-tender; bowel sounds normal; no masses,  no organomegaly Extremities: extremities normal, atraumatic, no cyanosis or edema Wound: clean and dry  Lab Results: Recent Labs    06/04/20 0118 06/05/20 0106  WBC 9.9 9.2  HGB 9.1* 8.4*  HCT 30.0* 27.6*  PLT 581* 545*   BMET:  Recent Labs    06/05/20 0106 06/06/20 0017  NA 134* 134*  K 4.2 4.4  CL 102 104  CO2 22 22  GLUCOSE 145* 152*  BUN 13 13  CREATININE 0.79 0.66  CALCIUM 8.5* 8.4*    PT/INR: No results for input(s): LABPROT, INR in the last 72 hours. ABG    Component Value Date/Time   PHART 7.443 05/26/2020 0335   HCO3 23.5 05/26/2020 0335   TCO2 27 05/17/2020 2036   ACIDBASEDEF 0.1 05/26/2020 0335   O2SAT 96.7 05/26/2020 0335   CBG (last 3)  Recent Labs    06/05/20 1614  06/05/20 2107 06/06/20 0609  GLUCAP 129* 147* 139*    Assessment/Plan: S/P Procedure(s) (LRB): ESOPHAGOGASTRODUODENOSCOPY (EGD); ESOPHAGEAL STENT REMOVAL. (N/A) ESOPHAGEAL STENT PLACEMENT USING A 23 MM COVERED STENT (N/A) VIDEO ASSISTED THORACOSCOPY (VATS)/EMPYEMA (Right)  1. CV- hemodynamically stable, sinus tachycardia, BP well controlled. 2. Pulm- CT removed, JP drain remains in place-no drainage recorded. CXR shows pleural effusion and atelectasis on the right side. 3. ID- patient febrile,WBC 9.2 this morning. blood culturesno growth to date. On Maxipime and Vanc for broad spectrum coverage. 4. GI- clear diet ordered however he is not swallowing anything 5. Deconditioning- once medically stable will need placement of CIR vs. SNF  Plan: We will need a PO trial to better assess him if possible. He seems agreeable today. Encouraged to ambulate in the halls and use his incentive spirometer.    LOS: 26 days    Elgie Collard 06/06/2020  Agree with above. We will remove last chest tube  Patient continues to require significant assistance with physical therapy. We will likely need SNF placement  Amoreena Neubert O Laretta Pyatt

## 2020-06-06 NOTE — Progress Notes (Signed)
PROGRESS NOTE    Evan Chavez  EGB:[id number] DOB: 23-Apr-1961 DOA: 05/10/2020 PCP: Rosita Fire, MD    Brief Narrative: Evan Chavez is a 59 y.o. year old male with medical history significant for GERD, Barrett's esophagus, peptic ulcer disease with extensive gastric erosion, duodenal ulcerand esophageal stricture status post balloon dilatation on 12/2018, also with history of prior stroke, hyperlipidemia, BPH, remote cocaine abuse and prior CVA who presented on 05/10/2020 to [facility] with generalized weakness, productive cough and low-grade fever shortness of breath initially admitted for pneumonia based off CTA chest showing bibasilar consolidation pleural effusions treated with ceftriaxone and azithromycin. Hospital course complicated by right-sided hydropneumothorax with possible lung collapse after right thoracentesis on 8/2, esophageal perforation (CT chest on 8/4)With large right-sided hydropneumothorax, complicated by aspiration pneumonia.  Patient underwent right-sided chest tube placement was transferred [facility] where he underwent VATS and decortication by CT surgery as well as esophageal stent placement and PEG tube placement on 8/4-8/5.  Patient has been managed on hospitalist service since.  Did have episodes of persistent leakage from esophagus on esophagram as well as requiring right thorascopic for drainage of loculated pleural effusion on 8/13 by CT surgery,  repeat esophagram on 8/16 did not show any esophageal leak. Currently hospitalist service is following as a consult and CT surgery is primary team.  Patient was restarted on broad spectrum antibiotics on 8/21 due to sepsis secondary to RLL pna evident on CXR and CT chest. His known pleural effusion showed slight decrease in size with appropriate position of chest tube on last CT chest on 8/21. Given his persistent tachycardia and tachypnea repeat CXR obtained on 8/23 again noted stable RLL Pna  and atlectasis.  Currently hospital course complicated by odynophagia/dysphagia and pocketing of oral secretions and poor adherence to pulmonary toilet. Speech is evaluating and assisting with planned FEES.    Assessment & Plan:   Active Problems:   Essential hypertension   Atrial tachycardia (HCC)   Dyslipidemia   CAP (community acquired pneumonia)   Transaminitis   Lobar pneumonia ([place])   Sepsis due to undetermined organism ()   Fever   Gastroesophageal reflux disease with esophagitis   Shortness of breath   Thyroid nodule   BPH (benign prostatic hyperplasia)   Loculated pleural effusion   Ureteral stone with hydronephrosis   Pneumothorax on right   Acute respiratory failure with hypoxia (HCC)   Chronic bilateral pleural effusions   Parapneumonic effusion   Malnutrition of moderate degree   Esophageal perforation   Pressure injury of skin   Atrial fibrillation with RVR (HCC)   Esophageal anastomotic leak   Atelectasis   Right lower lobe pulmonary infiltrate   Sinus tachycardia   Dysphagia   #1 acute hypoxic respiratory failure secondary to pneumonia/parapneumonic effusion/right apical pneumothorax status post chest tube placement and chest tube removed 06/01/2020 with no pneumothorax on repeat chest x-ray.  Patient completed a course of fluconazole for Candida para silicosis.  #2 sepsis present on admission due to esophageal rupture aspiration pneumonia and mediastinitis.  CT chest at the time of admission shows right lower lobe effusion and empyema.  Continue cefepime.  Vanco stopped.  MRSA negative.  #3 esophageal rupture status post chest tube which was removed 06/01/2020.  #4 oropharyngeal dysphagia continue tube feeds  #5 acute on chronic blood loss anemia stable.  This is thought to be secondary to blood loss from recent esophageal rupture and surgery as well as hematemesis.    #  6 severe deconditioning seen by PT recommends SNF.  Case manager aware.  #7  history of paroxysmal atrial fibrillation currently in sinus rhythm.  Was seen by cardiology recommended to continue metoprolol 50 twice a day and to discontinue diltiazem. Cardiology recommends DOAC when he is able to tolerate it.  #8 chronic sinus tachycardia CT chest 729 no PE.  Continue beta-blocker.  #9 history of prior stroke-aspirin on hold  Restart Lipitor prior to discharge, Lipitor on hold due to elevated LFTs.  #10 abnormal LFTs trending down continue to hold statins  #11 left hydronephrosis seen by urology recommended to continue original plan of shockwave lithotripsy as an outpatient.  This was not done during this admission due to patient's worsening respiratory status.  Urology will follow up with the patient as an outpatient.  Pressure Injury 05/17/20 Buttocks Right Stage 2 -  Partial thickness loss of dermis presenting as a shallow open injury with a red, pink wound bed without slough. (Active)  05/17/20 0800  Location: Buttocks  Location Orientation: Right  Staging: Stage 2 -  Partial thickness loss of dermis presenting as a shallow open injury with a red, pink wound bed without slough.  Wound Description (Comments):   Present on Admission:       Nutrition Problem: Moderate Malnutrition Etiology: chronic illness (Barrett's esophagus, GERD)     Signs/Symptoms: moderate fat depletion, moderate muscle depletion    Interventions: Tube feeding  Estimated body mass index is 19.66 kg/m as calculated from the following:   Height as of this encounter: 5\' 9"  (1.753 m).   Weight as of this encounter: 60.4 kg.  DVT prophylaxis: scd Code Status:full Family Communication: none at bedside  Procedures: chest tube Antimicrobials:  Anti-infectives (From admission, onward)   Start     Dose/Rate Route Frequency Ordered Stop   06/03/20 0200  vancomycin (VANCOCIN) IVPB 1000 mg/200 mL premix  Status:  Discontinued        1,000 mg 200 mL/hr over 60 Minutes Intravenous  Every 12 hours 06/02/20 1248 06/05/20 0915   06/02/20 2200  ceFEPIme (MAXIPIME) 2 g in sodium chloride 0.9 % 100 mL IVPB        2 g 200 mL/hr over 30 Minutes Intravenous Every 8 hours 06/02/20 1251     06/02/20 2000  piperacillin-tazobactam (ZOSYN) IVPB 3.375 g  Status:  Discontinued        3.375 g 12.5 mL/hr over 240 Minutes Intravenous Every 8 hours 06/02/20 1248 06/02/20 1249   06/02/20 1330  vancomycin (VANCOREADY) IVPB 1250 mg/250 mL        1,250 mg 166.7 mL/hr over 90 Minutes Intravenous  Once 06/02/20 1248 06/02/20 1646   06/02/20 1330  piperacillin-tazobactam (ZOSYN) IVPB 3.375 g  Status:  Discontinued        3.375 g 100 mL/hr over 30 Minutes Intravenous  Once 06/02/20 1248 06/02/20 1249   06/02/20 1215  vancomycin (VANCOCIN) IVPB 1000 mg/200 mL premix  Status:  Discontinued        1,000 mg 200 mL/hr over 60 Minutes Intravenous  Once 06/02/20 1202 06/02/20 1251   06/02/20 1215  ceFEPIme (MAXIPIME) 2 g in sodium chloride 0.9 % 100 mL IVPB        2 g 200 mL/hr over 30 Minutes Intravenous  Once 06/02/20 1202 06/02/20 1506   05/29/20 1800  vancomycin (VANCOCIN) IVPB 1000 mg/200 mL premix  Status:  Discontinued        1,000 mg 200 mL/hr over 60 Minutes Intravenous Every 12  hours 05/29/20 1151 05/30/20 0912   05/24/20 2100  vancomycin (VANCOREADY) IVPB 750 mg/150 mL  Status:  Discontinued        750 mg 150 mL/hr over 60 Minutes Intravenous Every 12 hours 05/24/20 0813 05/29/20 1151   05/24/20 0900  vancomycin (VANCOREADY) IVPB 1500 mg/300 mL        1,500 mg 150 mL/hr over 120 Minutes Intravenous  Once 05/24/20 0813 05/24/20 1221   05/17/20 0900  fluconazole (DIFLUCAN) IVPB 400 mg  Status:  Discontinued        400 mg 100 mL/hr over 120 Minutes Intravenous Every 24 hours 05/17/20 0749 06/05/20 0915   05/17/20 0600  vancomycin (VANCOREADY) IVPB 750 mg/150 mL  Status:  Discontinued        750 mg 150 mL/hr over 60 Minutes Intravenous Every 8 hours 05/16/20 2154 05/17/20 0903   05/17/20  0400  vancomycin (VANCOREADY) IVPB 750 mg/150 mL  Status:  Discontinued        750 mg 150 mL/hr over 60 Minutes Intravenous Every 8 hours 05/16/20 1945 05/16/20 2154   05/16/20 2200  vancomycin (VANCOREADY) IVPB 1250 mg/250 mL        1,250 mg 166.7 mL/hr over 90 Minutes Intravenous STAT 05/16/20 2153 05/16/20 2348   05/16/20 2000  vancomycin (VANCOREADY) IVPB 1250 mg/250 mL  Status:  Discontinued        1,250 mg 166.7 mL/hr over 90 Minutes Intravenous  Once 05/16/20 1945 05/16/20 2153   05/15/20 1600  piperacillin-tazobactam (ZOSYN) IVPB 3.375 g  Status:  Discontinued        3.375 g 12.5 mL/hr over 240 Minutes Intravenous Every 8 hours 05/15/20 0945 05/30/20 0912   05/15/20 0930  piperacillin-tazobactam (ZOSYN) IVPB 3.375 g        3.375 g 100 mL/hr over 30 Minutes Intravenous  Once 05/15/20 0921 05/15/20 1009   05/13/20 2000  azithromycin (ZITHROMAX) tablet 500 mg        500 mg Oral Daily 05/13/20 1155 05/14/20 2127   05/10/20 2200  cefTRIAXone (ROCEPHIN) 2 g in sodium chloride 0.9 % 100 mL IVPB  Status:  Discontinued        2 g 200 mL/hr over 30 Minutes Intravenous Every 24 hours 05/10/20 2148 05/15/20 0911   05/10/20 2200  azithromycin (ZITHROMAX) 500 mg in sodium chloride 0.9 % 250 mL IVPB  Status:  Discontinued        500 mg 250 mL/hr over 60 Minutes Intravenous Every 24 hours 05/10/20 2148 05/13/20 1155      Subjective: Patient resting in bed in no acute distress No family at bedside   Objective: Vitals:   06/06/20 0700 06/06/20 0811 06/06/20 0900 06/06/20 1000  BP: (!) 144/86 140/83 (!) 145/101 (!) 152/87  Pulse: (!) 116 (!) 117    Resp: 19 (!) 35 (!) 35 20  Temp:  98.5 F (36.9 C)    TempSrc:  Oral    SpO2:  95%    Weight:      Height:        Intake/Output Summary (Last 24 hours) at 06/06/2020 1503 Last data filed at 06/06/2020 0500 Gross per 24 hour  Intake 942.48 ml  Output 1500 ml  Net -557.52 ml   Filed Weights   06/04/20 0500 06/05/20 0335 06/06/20 0622    Weight: 58.5 kg 59.8 kg 60.4 kg    Examination:  General exam: Appears calm and comfortable  Respiratory system: Clear to auscultation. Respiratory effort normal. Cardiovascular system: S1 & S2  heard, tachycardic . No JVD, murmurs, rubs, gallops or clicks. No pedal edema. Gastrointestinal system: Abdomen is nondistended, soft and nontender. No organomegaly or masses felt. Normal bowel sounds heard. Central nervous system: Alert and oriented. No focal neurological deficits. Extremities: Symmetric 5 x 5 power. Skin: No rashes, lesions or ulcers Psychiatry: Judgement and insight appear normal. Mood & affect appropriate.     Data Reviewed: I have personally reviewed following labs and imaging studies  CBC: Recent Labs  Lab 06/01/20 0019 06/02/20 0038 06/03/20 0121 06/04/20 0118 06/05/20 0106  WBC 15.3* 21.2* 14.9* 9.9 9.2  NEUTROABS 13.5* 18.7* 12.3* 8.1* 7.6  HGB 9.4* 9.4* 9.2* 9.1* 8.4*  HCT 30.8* 30.3* 30.3* 30.0* 27.6*  MCV 85.6 86.6 87.1 84.7 85.2  PLT 739* 688* 697* 581* 242*   Basic Metabolic Panel: Recent Labs  Lab 06/02/20 0038 06/03/20 0121 06/04/20 0118 06/05/20 0106 06/06/20 0017  NA 134* 138 138 134* 134*  K 4.5 4.6 4.4 4.2 4.4  CL 103 103 103 102 104  CO2 24 22 22 22 22   GLUCOSE 151* 123* 138* 145* 152*  BUN 18 19 16 13 13   CREATININE 0.90 0.83 0.86 0.79 0.66  CALCIUM 8.5* 9.0 8.7* 8.5* 8.4*   GFR: Estimated Creatinine Clearance: 83.9 mL/min (by C-G formula based on SCr of 0.66 mg/dL). Liver Function Tests: Recent Labs  Lab 06/01/20 0019 06/02/20 0038 06/03/20 0121 06/04/20 0118 06/05/20 0106  AST 57* 37 72* 96* 89*  ALT 167* 130* 152* 213* 205*  ALKPHOS 266* 262* 275* 295* 274*  BILITOT 0.5 0.4 0.5 0.5 0.4  PROT 7.4 7.4 7.8 7.6 7.4  ALBUMIN 1.9* 1.9* 2.0* 2.0* 1.9*   No results for input(s): LIPASE, AMYLASE in the last 168 hours. No results for input(s): AMMONIA in the last 168 hours. Coagulation Profile: Recent Labs  Lab  06/02/20 1240 06/02/20 1524  INR 1.2 1.2   Cardiac Enzymes: No results for input(s): CKTOTAL, CKMB, CKMBINDEX, TROPONINI in the last 168 hours. BNP (last 3 results) No results for input(s): PROBNP in the last 8760 hours. HbA1C: No results for input(s): HGBA1C in the last 72 hours. CBG: Recent Labs  Lab 06/05/20 1111 06/05/20 1614 06/05/20 2107 06/06/20 0609 06/06/20 1130  GLUCAP 123* 129* 147* 139* 158*   Lipid Profile: No results for input(s): CHOL, HDL, LDLCALC, TRIG, CHOLHDL, LDLDIRECT in the last 72 hours. Thyroid Function Tests: No results for input(s): TSH, T4TOTAL, FREET4, T3FREE, THYROIDAB in the last 72 hours. Anemia Panel: No results for input(s): VITAMINB12, FOLATE, FERRITIN, TIBC, IRON, RETICCTPCT in the last 72 hours. Sepsis Labs: Recent Labs  Lab 06/02/20 1524 06/02/20 1635  LATICACIDVEN 1.5 1.6    Recent Results (from the past 240 hour(s))  Culture, blood (routine x 2)     Status: None   Collection Time: 06/01/20 10:28 AM   Specimen: BLOOD LEFT ARM  Result Value Ref Range Status   Specimen Description BLOOD LEFT ARM  Final   Special Requests BOTTLES DRAWN AEROBIC ONLY  Final   Culture   Final    NO GROWTH 5 DAYS Performed at [facility], [place] [address], [place], [place] [address]    Report Status 06/06/2020 FINAL  Final  Culture, blood (routine x 2)     Status: None   Collection Time: 06/01/20 10:30 AM   Specimen: BLOOD LEFT HAND  Result Value Ref Range Status   Specimen Description BLOOD LEFT HAND  Final   Special Requests   Final  BOTTLES DRAWN AEROBIC ONLY Blood Culture adequate volume   Culture   Final    NO GROWTH 5 DAYS Performed at [facility], [place] [address], [place], [place] [address]    Report Status 06/06/2020 FINAL  Final  Culture, blood (x 2)     Status: None (Preliminary result)   Collection Time: 06/02/20  3:18 PM   Specimen: BLOOD LEFT HAND  Result Value Ref Range Status   Specimen Description BLOOD LEFT  HAND  Final   Special Requests   Final    BOTTLES DRAWN AEROBIC AND ANAEROBIC Blood Culture adequate volume   Culture   Final    NO GROWTH 4 DAYS Performed at [facility], [place] [address], [place], [place] [address]    Report Status PENDING  Incomplete  Culture, blood (x 2)     Status: None (Preliminary result)   Collection Time: 06/02/20  3:24 PM   Specimen: BLOOD RIGHT HAND  Result Value Ref Range Status   Specimen Description BLOOD RIGHT HAND  Final   Special Requests   Final    BOTTLES DRAWN AEROBIC AND ANAEROBIC Blood Culture adequate volume   Culture   Final    NO GROWTH 4 DAYS Performed at [facility], [place] [address], [place], [place] [address]    Report Status PENDING  Incomplete         Radiology Studies: No results found.      Scheduled Meds: . bisacodyl  10 mg Oral Daily  . feeding supplement  1 Container Oral BID BM  . feeding supplement (PROSource TF)  45 mL Per Tube BID  . free water  150 mL Per Tube Q4H  . insulin aspart  0-24 Units Subcutaneous TID AC & HS  . mouth rinse  15 mL Mouth Rinse q12n4p  . metoprolol tartrate  50 mg Per Tube BID  . pantoprazole (PROTONIX) IV  40 mg Intravenous Q12H  . sennosides  5 mL Per Tube QHS   Continuous Infusions: . sodium chloride 1,000 mL (06/06/20 0028)  . sodium chloride    . sodium chloride    . ceFEPime (MAXIPIME) IV 2 g (06/06/20 1420)  . feeding supplement (OSMOLITE 1.5 CAL) 1,000 mL (06/05/20 1644)     LOS: 26 days     Georgette Shell, MD  06/06/2020, 3:03 PM

## 2020-06-06 NOTE — Progress Notes (Signed)
Occupational Therapy Treatment Patient Details Name: Evan Chavez MRN: [id number] DOB: 03/26/1961 Today's Date: 06/06/2020    History of present illness Pt is 59 yo male presents to the hospital on 7/28 with generalized weakness, productive cough and low-grade fever. Pt is currently being treated for hydropneumothorax, Sepsis aspiration pneumonia and esophagal rupture, AKI and acute delirium. Pt had successful R thoracentesis on 8/2. Pt had a chest tube put in on 8/2 and was removed 8/9. Pt also underwent a VATS, decortication, EGD, PEG tube placement, and a esophageal stent on 8/9. PMH of GERD, Barrett's esophagus, peptic ulcer disease with extensive gastric erosion, duodenal ulcer and esophageal stricture, also with history of prior stroke, hyperlipidemia, BPH, remote to cocaine abuse. Pt returned to OR on 8/13 for Right thoracoscopy, chest tube placement, esophageal stent placement.  Chest tube removed 8/20 and chest xray stable.   OT comments  Pt wishing to return to bed upon arrival to room with RN present as well. Pt performing functional transfers using RW overall with minA throughout. Pt requiring totalA for posterior pericare prior to return to supine given some residual BM (?vs incontinence) while in chair. He remains with flat affect and very minimal verbalizations. HR up to 121 with activity with RR intermittently into the low 40s. Given pt's slower progress have updated discharge recommendations. He will benefit from continued acute OT services and recommend SNF level therapies at time of discharge. Will follow.    Follow Up Recommendations  SNF    Equipment Recommendations  Other (comment) (TBD)          Precautions / Restrictions Precautions Precautions: Fall Precaution Comments: PEG Restrictions Weight Bearing Restrictions: No       Mobility Bed Mobility Overal bed mobility: Needs Assistance Bed Mobility: Sit to Supine Rolling: Min guard Sidelying to sit: Min  assist;HOB elevated   Sit to supine: Min assist   General bed mobility comments: assist for LEs, cues to self assist with bridging/scooting hips over towards center of bed   Transfers Overall transfer level: Needs assistance Equipment used: Rolling walker (2 wheeled) Transfers: Sit to/from Stand Sit to Stand: Min assist         General transfer comment: Assist to bring hips up and for balance, completed from recliner and from EOB    Balance Overall balance assessment: Needs assistance Sitting-balance support: Feet supported;No upper extremity supported Sitting balance-Leahy Scale: Fair     Standing balance support: During functional activity Standing balance-Leahy Scale: Poor Standing balance comment: walker and min guard for static standing                           ADL either performed or assessed with clinical judgement   ADL Overall ADL's : Needs assistance/impaired     Grooming: Min guard;Sitting;Wash/dry face Grooming Details (indicate cue type and reason): seated EOB                     Toileting- Clothing Manipulation and Hygiene: Total assistance;Sit to/from stand Toileting - Clothing Manipulation Details (indicate cue type and reason): for posterior pericare      Functional mobility during ADLs: Minimal assistance;Rolling walker                         Cognition Arousal/Alertness: Awake/alert Behavior During Therapy: Flat affect Overall Cognitive Status: Impaired/Different from baseline Area of Impairment: Following commands;Problem solving  Following Commands: Follows one step commands consistently;Follows one step commands with increased time     Problem Solving: Slow processing;Decreased initiation;Requires verbal cues General Comments: Pt with very little verbalization        Exercises Exercises: General Lower Extremity;General Upper Extremity General Exercises - Upper  Extremity Shoulder Flexion: AAROM;Both;5 reps (RUE to 90* given shoulder limitations ) General Exercises - Lower Extremity Straight Leg Raises: AROM;Both;5 reps   Shoulder Instructions       General Comments HR up to 121 with activity     Pertinent Vitals/ Pain       Pain Assessment: Faces Faces Pain Scale: Hurts little more Pain Location: rt side Pain Descriptors / Indicators: Grimacing;Guarding Pain Intervention(s): Monitored during session;Limited activity within patient's tolerance  Home Living                                          Prior Functioning/Environment              Frequency  Min 2X/week        Progress Toward Goals  OT Goals(current goals can now be found in the care plan section)  Progress towards OT goals: Progressing toward goals (slowly)  Acute Rehab OT Goals Patient Stated Goal: get better OT Goal Formulation: With patient Time For Goal Achievement: 06/20/20 Potential to Achieve Goals: Good  Plan Discharge plan needs to be updated    Co-evaluation                 AM-PAC OT "6 Clicks" Daily Activity     Outcome Measure   Help from another person eating meals?: A Lot Help from another person taking care of personal grooming?: A Little Help from another person toileting, which includes using toliet, bedpan, or urinal?: A Lot Help from another person bathing (including washing, rinsing, drying)?: A Lot Help from another person to put on and taking off regular upper body clothing?: A Little Help from another person to put on and taking off regular lower body clothing?: A Lot 6 Click Score: 14    End of Session Equipment Utilized During Treatment: Rolling walker  OT Visit Diagnosis: Unsteadiness on feet (R26.81);Other abnormalities of gait and mobility (R26.89);Muscle weakness (generalized) (M62.81);Other symptoms and signs involving cognitive function;Feeding difficulties (R63.3);Pain Pain - Right/Left:  Right Pain - part of body:  (flank)   Activity Tolerance Patient tolerated treatment well   Patient Left in bed;with call bell/phone within reach;with bed alarm set   Nurse Communication Mobility status        Time: 1205-1219 OT Time Calculation (min): 14 min  Charges: OT General Charges $OT Visit: 1 Visit OT Treatments $Self Care/Home Management : 8-22 mins  Lou Cal, [place] Pager [phone] Office [phone]    Raymondo Band 06/06/2020, 1:59 PM

## 2020-06-06 NOTE — Procedures (Signed)
Objective Swallowing Evaluation: Type of Study: FEES-Fiberoptic Endoscopic Evaluation of Swallow   Patient Details  Name: Evan Chavez MRN: [id number] Date of Birth: 05-23-1961  Today's Date: 06/06/2020 Time: SLP Start Time (ACUTE ONLY): 1138 -SLP Stop Time (ACUTE ONLY): 1200  SLP Time Calculation (min) (ACUTE ONLY): 22 min   Past Medical History:  Past Medical History:  Diagnosis Date  . Barrett's esophagus   . Chronic anemia   . Cocaine abuse ([place])   . Duodenal ulcer   . Esophageal stricture   . ETOH abuse   . GERD (gastroesophageal reflux disease)   . Hyperlipidemia   . Hypertension   . Peptic ulcer   . Pneumonia   . 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 BIOPSY  08/25/2018   Procedure: BIOPSY;  Surgeon: [name], MD;  [facility];  Service: Endoscopy;;  . BIOPSY  12/13/2018   Procedure: BIOPSY;  Surgeon: [name], MD;  [facility]: [facility];  Service: Endoscopy;;  esophagus  . COLONOSCOPY WITH PROPOFOL N/A 04/07/2019   Dr. [name]: 3 Tubular adenomas removed (4 to 8 mm in size), next colonoscopy 3 years.  . ESOPHAGEAL STENT PLACEMENT N/A 05/17/2020   Procedure: ESOPHAGEAL STENT PLACEMENT AND  PEG TUBE PLACEMENT;  Surgeon: Lajuana Matte, MD;  Location: [place];  Service: Thoracic;  Laterality: N/A;  . ESOPHAGEAL STENT PLACEMENT N/A 05/25/2020   Procedure: ESOPHAGEAL STENT PLACEMENT USING A 23 MM COVERED STENT;  Surgeon: Lajuana Matte, MD;  Location: [place];  Service: Thoracic;  Laterality: N/A;  . ESOPHAGOGASTRODUODENOSCOPY N/A 05/17/2020   Procedure: ESOPHAGOGASTRODUODENOSCOPY (EGD);  Surgeon: Lajuana Matte, MD;  Location: [place];  Service: Thoracic;  Laterality: N/A;  . ESOPHAGOGASTRODUODENOSCOPY N/A 05/25/2020   Procedure: ESOPHAGOGASTRODUODENOSCOPY (EGD); ESOPHAGEAL STENT REMOVAL.;  Surgeon: Lajuana Matte, MD;  Location: [facility];  Service: Thoracic;  Laterality: N/A;  . ESOPHAGOGASTRODUODENOSCOPY (EGD) WITH PROPOFOL N/A 08/25/2018   Dr. [name]: Severely inflamed abnormal appearing mid/distal esophagus.  Encroachment on lumen suspicious for infiltrating neoplasm located be all from severe benign inflammation, extensive gastric erosions and extensive duodenal ulcerations.  Query ischemic process versus other.  Biopsy revealed Barrett's esophagus but negative for malignancy and H. pylori.  . ESOPHAGOGASTRODUODENOSCOPY (EGD) WITH PROPOFOL N/A 12/13/2018   Dr. [name]: peptic stricture s/p balloon dilatation. abnormal esophageal mucosa, by c/w barrett's without dysplasia, previous PUD completely healed.   Marland Kitchen POLYPECTOMY  04/07/2019   Procedure: POLYPECTOMY;  Surgeon: [name], MD;  [facility];  Service: Endoscopy;;  colon  . VIDEO ASSISTED THORACOSCOPY (VATS)/DECORTICATION Right 05/17/2020   Procedure: RIGHT VIDEO ASSISTED THORACOSCOPY (VATS)/DECORTICATION;  Surgeon: Lajuana Matte, MD;  Location: [place];  Service: Thoracic;  Laterality: Right;  Marland Kitchen VIDEO ASSISTED THORACOSCOPY (VATS)/EMPYEMA Right 05/25/2020   Procedure: VIDEO ASSISTED THORACOSCOPY (VATS)/EMPYEMA;  Surgeon: Lajuana Matte, MD;  Location: [facility];  Service: Thoracic;  Laterality: Right;   HPI: Evan Chavez is a 59 y.o. year old male initally admitted with generalized weakness, productive cough and low-grade fever shortness of breath initially admitted for pneumonia based off CTA chest showing bibasilar consolidation pleural effusions treated with ceftriaxone and azithromycin. Pt had an MBS on 05/14/20 (prior to any notes indicating esopahgeal perforation) that showed mild oropharyngeal dysphagia with only premature spillage, mild oral residue. Pt recommended to consume regualr/thin.  Study reviewed by Dr. Elsworth Soho. On that same  day, Pt underwent a Ultrasound-guided thoracentesis to define parapneumonic effusion, suspected to be from aspiration pna  due to esophageal stricture. Found to have RT apical pneumothorax. Pt was then transferred to [facility] for a VATS decortication (not done?) and Chest tube placed. Post procedure CT chest performed which showed esophageal perforation. [facility] then reported "He has a long history of peptic strictures that have been dilated on multiple occasions.  It appears as though this may be the culprit for his perforation."  Underwent esophageal stent placement and PEG tube placement on 8/4-8/5.  Did have episodes of persistent leakage from esophagus on esophagram as well as requiring right thorascopic for drainage of loculated pleural effusion on 8/13 by CT surgery,  repeat esophagram on 8/16 did not show any esophageal leak. Pt has not been accepting oral PO, or has been orally holding his secretions. Has also been treated for candida. PMH: GERD, Barrett's esophagus, peptic ulcer disease with extensive gastric erosion, duodenal ulcerand esophageal stricture status post balloon dilatation on 12/2018, also with history of prior stroke, hyperlipidemia, BPH, remote cocaine abuse and prior CVA.   Subjective: "My chest hurts when I am coughing sometimes."    Assessment / Plan / Recommendation  CHL IP CLINICAL IMPRESSIONS 06/06/2020  Clinical Impression Direct visualization of pharynx/larynx shows mild to moderate edema of the posterior commisure and false vocal folds with pseudo sulcus vocalis. This may account for pts hoarseness. Standing secretions present in trachea, valleculae, pyriforms.  In non swallowing tasks, mobility of structure appears WNL. When given PO pt orally held the bolus through the assessment. With max encouragement, the pt did initiate swallows, but did not orally transit the bolus. Eventually PO was suctioned from his mouth. Pt also needed max encouragement to clear standing tracheal secretions. Pt reports pain the right sided abdomen when he swallows, but denies any throat pain. Overall, pt has no observable  oropharyngeal dysphagia, other than oral aversion. Will briefly trial swallowing therapy interventions to encourage oral intake, though futher investigation into pts abdominal pain is also needed. Question if psych consult may be beneficial.   SLP Visit Diagnosis Dysphagia, unspecified (R13.10)  Attention and concentration deficit following --  Frontal lobe and executive function deficit following --  Impact on safety and function Risk for inadequate nutrition/hydration      CHL IP TREATMENT RECOMMENDATION 06/06/2020  Treatment Recommendations Therapy as outlined in treatment plan below     Prognosis 06/06/2020  Prognosis for Safe Diet Advancement Good  Barriers to Reach Goals Motivation;Behavior  Barriers/Prognosis Comment --    CH[facility] IP DIET RECOMMENDATION 06/06/2020  SLP Diet Recommendations Thin liquid  Liquid Administration via --  Medication Administration --  Compensations Slow rate;Small sips/bites  Postural Changes Seated upright at 90 degrees      CH[facility] IP OTHER RECOMMENDATIONS 06/06/2020  Recommended Consults --  Oral Care Recommendations Oral care BID  Other Recommendations Have oral suction available      CH[facility] IP FOLLOW UP RECOMMENDATIONS 06/06/2020  Follow up Recommendations Skilled Nursing facility      [facility] IP FREQUENCY AND DURATION 06/06/2020  Speech Therapy Frequency (ACUTE ONLY) min 2x/week  Treatment Duration 2 weeks           CHL IP ORAL PHASE 06/06/2020  Oral Phase Impaired  Oral - Pudding Teaspoon --  Oral - Pudding Cup --  Oral - Honey Teaspoon --  Oral - Honey Cup --  Oral - Nectar Teaspoon --  Oral - Nectar Cup --  Oral - Nectar Straw --  Oral - Thin Teaspoon --  Oral - Thin Cup --  Oral - Thin Straw Holding of bolus  Oral - Puree NT  Oral - Mech Soft --  Oral - Regular --  Oral - Multi-Consistency --  Oral - Pill --  Oral Phase - Comment --    CHL IP PHARYNGEAL PHASE 06/06/2020  Pharyngeal Phase --  Pharyngeal- Pudding Teaspoon --   Pharyngeal --  Pharyngeal- Pudding Cup --  Pharyngeal --  Pharyngeal- Honey Teaspoon --  Pharyngeal --  Pharyngeal- Honey Cup --  Pharyngeal --  Pharyngeal- Nectar Teaspoon --  Pharyngeal --  Pharyngeal- Nectar Cup --  Pharyngeal --  Pharyngeal- Nectar Straw --  Pharyngeal --  Pharyngeal- Thin Teaspoon NT  Pharyngeal --  Pharyngeal- Thin Cup NT  Pharyngeal --  Pharyngeal- Thin Straw NT  Pharyngeal --  Pharyngeal- Puree NT  Pharyngeal --  Pharyngeal- Mechanical Soft --  Pharyngeal --  Pharyngeal- Regular NT  Pharyngeal --  Pharyngeal- Multi-consistency --  Pharyngeal --  Pharyngeal- Pill NT  Pharyngeal --  Pharyngeal Comment --     CHL IP CERVICAL ESOPHAGEAL PHASE 05/14/2020  Cervical Esophageal Phase WFL  Pudding Teaspoon --  Pudding Cup --  Honey Teaspoon --  Honey Cup --  Nectar Teaspoon --  Nectar Cup --  Nectar Straw --  Thin Teaspoon --  Thin Cup --  Thin Straw --  Puree --  Mechanical Soft --  Regular --  Multi-consistency --  Pill --  Cervical Esophageal Comment --    Herbie Baltimore, MA CCC-SLP  Acute Rehabilitation Services Pager [phone] Office [phone]  Lynann Beaver 06/06/2020, 1:43 PM

## 2020-06-06 NOTE — Plan of Care (Signed)

## 2020-06-07 LAB — GLUCOSE, CAPILLARY
Glucose-Capillary: 141 mg/dL — ABNORMAL HIGH (ref 70–99)
Glucose-Capillary: 132 mg/dL — ABNORMAL HIGH (ref 70–99)
Glucose-Capillary: 136 mg/dL — ABNORMAL HIGH (ref 70–99)
Glucose-Capillary: 117 mg/dL — ABNORMAL HIGH (ref 70–99)
Glucose-Capillary: 114 mg/dL — ABNORMAL HIGH (ref 70–99)

## 2020-06-07 LAB — CULTURE, BLOOD (ROUTINE X 2)
Culture: NO GROWTH
Culture: NO GROWTH
Special Requests: ADEQUATE
Special Requests: ADEQUATE

## 2020-06-07 MED ORDER — TRAMADOL HCL 50 MG PO TABS
25.0000 mg | ORAL_TABLET | Freq: Four times a day (QID) | ORAL | Status: DC | PRN
  Administered 2020-06-10 (×2): 25 mg
  Filled 2020-06-07 (×2): qty 1

## 2020-06-07 NOTE — Plan of Care (Signed)

## 2020-06-07 NOTE — Plan of Care (Signed)
  Problem: Clinical Measurements: Goal: Respiratory complications will improve Outcome: Progressing Goal: Cardiovascular complication will be avoided Outcome: Progressing   

## 2020-06-07 NOTE — Progress Notes (Addendum)
      [address]       [employer] [address]             [phone]      13 Days Post-Op Procedure(s) (LRB): ESOPHAGOGASTRODUODENOSCOPY (EGD); ESOPHAGEAL STENT REMOVAL. (N/A) ESOPHAGEAL STENT PLACEMENT USING A 23 MM COVERED STENT (N/A) VIDEO ASSISTED THORACOSCOPY (VATS)/EMPYEMA (Right) Subjective: Congested this morning but no pain. He did have some pain in his stomach during the swallow study yesterday but no throat pain.   Objective: Vital signs in last 24 hours: Temp:  [98.4 F (36.9 C)-99.1 F (37.3 C)] 98.4 F (36.9 C) (08/26 0342) Pulse Rate:  [111-117] 112 (08/26 0342) Cardiac Rhythm: Sinus tachycardia (08/26 0701) Resp:  [20-44] 27 (08/26 0342) BP: (139-152)/(82-101) 141/89 (08/26 0342) SpO2:  [95 %-98 %] 98 % (08/26 0342) Weight:  [60.2 kg] 60.2 kg (08/26 0500)     Intake/Output from previous day: 08/25 0701 - 08/26 0700 In: 1680 [NG/GT:1680] Out: -  Intake/Output this shift: No intake/output data recorded.  General appearance: alert, cooperative and no distress Heart: sinus tachycardia Lungs: bilateral rhonchi Abdomen: soft, non-tender; bowel sounds normal; no masses,  no organomegaly Extremities: extremities normal, atraumatic, no cyanosis or edema Wound: clean and dry  Lab Results: Recent Labs    06/05/20 0106  WBC 9.2  HGB 8.4*  HCT 27.6*  PLT 545*   BMET:  Recent Labs    06/05/20 0106 06/06/20 0017  NA 134* 134*  K 4.2 4.4  CL 102 104  CO2 22 22  GLUCOSE 145* 152*  BUN 13 13  CREATININE 0.79 0.66  CALCIUM 8.5* 8.4*    PT/INR: No results for input(s): LABPROT, INR in the last 72 hours. ABG    Component Value Date/Time   PHART 7.443 05/26/2020 0335   HCO3 23.5 05/26/2020 0335   TCO2 27 05/17/2020 2036   ACIDBASEDEF 0.1 05/26/2020 0335   O2SAT 96.7 05/26/2020 0335   CBG (last 3)  Recent Labs    06/06/20 1640 06/06/20 2128 06/07/20 0548  GLUCAP 119* 122* 141*    Assessment/Plan: S/P Procedure(s)  (LRB): ESOPHAGOGASTRODUODENOSCOPY (EGD); ESOPHAGEAL STENT REMOVAL. (N/A) ESOPHAGEAL STENT PLACEMENT USING A 23 MM COVERED STENT (N/A) VIDEO ASSISTED THORACOSCOPY (VATS)/EMPYEMA (Right)  1. Swallow study yesterday showed: Overall, pt has no observable oropharyngeal dysphagia, other than oral aversion. 2. ID- patient afebrile,WBC 9.2this morning. blood culturesno growth to date. On Maxipime and Vanc for broad spectrum coverage. 3. Continue clear liquid diet 4. CV- BP well controlled, continues sinus tachycardia 5. Deconditioning-will need SNF placement, does not qualify for CIR 6. Congestion- continue nebs  Plan: Continue ambulation and use of pulm toilet. Working on placement for SNF.    LOS: 27 days    Evan Chavez 06/07/2020  Agree with above. He remains afebrile after drain removal We will assess for stent removal in 3 to 4 weeks. Continue physical therapy and dispo planning for SNF.  Evan Chavez

## 2020-06-07 NOTE — Social Work (Signed)
8/26: CSW spoke with pt and mother about possible dc and SNF choices. Mother stated he does not have disability, only Medicaid, she stated that she is in the process of applying for disability as he was initially denied. CSW recommended that they look into an attorney. Current barrier is insurance.

## 2020-06-07 NOTE — Progress Notes (Signed)
PROGRESS NOTE    Evan Chavez  OBS:[id number] DOB: 12-12-1960 DOA: 05/10/2020 PCP: Rosita Fire, MD    Brief Narrative: Evan Chavez is a 59 y.o. year old male with medical history significant for GERD, Barrett's esophagus, peptic ulcer disease with extensive gastric erosion, duodenal ulcerand esophageal stricture status post balloon dilatation on 12/2018, also with history of prior stroke, hyperlipidemia, BPH, remote cocaine abuse and prior CVA who presented on 05/10/2020 to [facility] with generalized weakness, productive cough and low-grade fever shortness of breath initially admitted for pneumonia based off CTA chest showing bibasilar consolidation pleural effusions treated with ceftriaxone and azithromycin. Hospital course complicated by right-sided hydropneumothorax with possible lung collapse after right thoracentesis on 8/2, esophageal perforation (CT chest on 8/4)With large right-sided hydropneumothorax, complicated by aspiration pneumonia.  Patient underwent right-sided chest tube placement was transferred from [facility] to [facility] where he underwent VATS and decortication by CT surgery as well as esophageal stent placement and PEG tube placement on 8/4-8/5.  Patient has been managed on hospitalist service since.  Did have episodes of persistent leakage from esophagus on esophagram as well as requiring right thorascopic for drainage of loculated pleural effusion on 8/13 by CT surgery,  repeat esophagram on 8/16 did not show any esophageal leak. Currently hospitalist service is following as a consult and CT surgery is primary team.  Patient was restarted on broad spectrum antibiotics on 8/21 due to sepsis secondary to RLL pna evident on CXR and CT chest. His known pleural effusion showed slight decrease in size with appropriate position of chest tube on last CT chest on 8/21. Given his persistent tachycardia and tachypnea repeat CXR obtained on 8/23 again noted stable RLL Pna  and atlectasis.  Currently hospital course complicated by odynophagia/dysphagia and pocketing of oral secretions and poor adherence to pulmonary toilet. Speech is evaluating and assisting with planned FEES.    Assessment & Plan:   Active Problems:   Essential hypertension   Atrial tachycardia (HCC)   Dyslipidemia   CAP (community acquired pneumonia)   Transaminitis   Lobar pneumonia ([place])   Sepsis due to undetermined organism ([place])   Fever   Gastroesophageal reflux disease with esophagitis   Shortness of breath   Thyroid nodule   BPH (benign prostatic hyperplasia)   Loculated pleural effusion   Ureteral stone with hydronephrosis   Pneumothorax on right   Acute respiratory failure with hypoxia (HCC)   Chronic bilateral pleural effusions   Parapneumonic effusion   Malnutrition of moderate degree   Esophageal perforation   Pressure injury of skin   Atrial fibrillation with RVR (HCC)   Esophageal anastomotic leak   Atelectasis   Right lower lobe pulmonary infiltrate   Sinus tachycardia   Dysphagia   Respiratory distress   Status post thoracentesis   #1 acute hypoxic respiratory failure secondary to pneumonia/parapneumonic effusion/right apical pneumothorax status post chest tube placement and chest tube removed 06/01/2020 with no pneumothorax on repeat chest x-ray.  Patient completed a course of fluconazole for Candida para silicosis.  #2 sepsis present on admission due to esophageal rupture aspiration pneumonia and mediastinitis.  CT chest at the time of admission shows right lower lobe effusion and empyema.  Continue cefepime.  Last dose of cefepime 06/08/2020 Vanco stopped.  MRSA negative.  #3 esophageal rupture status post chest tube which was removed 06/01/2020.  #4 oropharyngeal dysphagia continue tube feeds  #5 acute on chronic blood loss anemia stable.  This is thought to be secondary to  blood loss from recent esophageal rupture and surgery as well as hematemesis.      #6 severe deconditioning seen by PT recommends SNF.  Case manager aware.  #7 history of paroxysmal atrial fibrillation currently in sinus rhythm.  Was seen by cardiology recommended to continue metoprolol 50 twice a day and to discontinue diltiazem. Cardiology recommends DOAC when he is able to tolerate it.  #8 chronic sinus tachycardia CT chest 729 no PE.  Continue beta-blocker.  #9 history of prior stroke-aspirin on hold  Restart Lipitor prior to discharge, Lipitor on hold due to elevated LFTs.  #10 abnormal LFTs trending down continue to hold statins  #11 left hydronephrosis seen by urology recommended to continue original plan of shockwave lithotripsy as an outpatient.  This was not done during this admission due to patient's worsening respiratory status.  Urology will follow up with the patient as an outpatient.  Pressure Injury 05/17/20 Buttocks Right Stage 2 -  Partial thickness loss of dermis presenting as a shallow open injury with a red, pink wound bed without slough. (Active)  05/17/20 0800  Location: Buttocks  Location Orientation: Right  Staging: Stage 2 -  Partial thickness loss of dermis presenting as a shallow open injury with a red, pink wound bed without slough.  Wound Description (Comments):   Present on Admission:       Nutrition Problem: Moderate Malnutrition Etiology: chronic illness (Barrett's esophagus, GERD)     Signs/Symptoms: moderate fat depletion, moderate muscle depletion    Interventions: Tube feeding  Estimated body mass index is 19.6 kg/m as calculated from the following:   Height as of this encounter: 5\' 9"  (1.753 m).   Weight as of this encounter: 60.2 kg.  DVT prophylaxis: scd Code Status:full Family Communication: none at bedside  Procedures: chest tube Antimicrobials:  Anti-infectives (From admission, onward)   Start     Dose/Rate Route Frequency Ordered Stop   06/03/20 0200  vancomycin (VANCOCIN) IVPB 1000 mg/200 mL premix   Status:  Discontinued        1,000 mg 200 mL/hr over 60 Minutes Intravenous Every 12 hours 06/02/20 1248 06/05/20 0915   06/02/20 2200  ceFEPIme (MAXIPIME) 2 g in sodium chloride 0.9 % 100 mL IVPB        2 g 200 mL/hr over 30 Minutes Intravenous Every 8 hours 06/02/20 1251 06/08/20 2359   06/02/20 2000  piperacillin-tazobactam (ZOSYN) IVPB 3.375 g  Status:  Discontinued        3.375 g 12.5 mL/hr over 240 Minutes Intravenous Every 8 hours 06/02/20 1248 06/02/20 1249   06/02/20 1330  vancomycin (VANCOREADY) IVPB 1250 mg/250 mL        1,250 mg 166.7 mL/hr over 90 Minutes Intravenous  Once 06/02/20 1248 06/02/20 1646   06/02/20 1330  piperacillin-tazobactam (ZOSYN) IVPB 3.375 g  Status:  Discontinued        3.375 g 100 mL/hr over 30 Minutes Intravenous  Once 06/02/20 1248 06/02/20 1249   06/02/20 1215  vancomycin (VANCOCIN) IVPB 1000 mg/200 mL premix  Status:  Discontinued        1,000 mg 200 mL/hr over 60 Minutes Intravenous  Once 06/02/20 1202 06/02/20 1251   06/02/20 1215  ceFEPIme (MAXIPIME) 2 g in sodium chloride 0.9 % 100 mL IVPB        2 g 200 mL/hr over 30 Minutes Intravenous  Once 06/02/20 1202 06/02/20 1506   05/29/20 1800  vancomycin (VANCOCIN) IVPB 1000 mg/200 mL premix  Status:  Discontinued  1,000 mg 200 mL/hr over 60 Minutes Intravenous Every 12 hours 05/29/20 1151 05/30/20 0912   05/24/20 2100  vancomycin (VANCOREADY) IVPB 750 mg/150 mL  Status:  Discontinued        750 mg 150 mL/hr over 60 Minutes Intravenous Every 12 hours 05/24/20 0813 05/29/20 1151   05/24/20 0900  vancomycin (VANCOREADY) IVPB 1500 mg/300 mL        1,500 mg 150 mL/hr over 120 Minutes Intravenous  Once 05/24/20 0813 05/24/20 1221   05/17/20 0900  fluconazole (DIFLUCAN) IVPB 400 mg  Status:  Discontinued        400 mg 100 mL/hr over 120 Minutes Intravenous Every 24 hours 05/17/20 0749 06/05/20 0915   05/17/20 0600  vancomycin (VANCOREADY) IVPB 750 mg/150 mL  Status:  Discontinued        750  mg 150 mL/hr over 60 Minutes Intravenous Every 8 hours 05/16/20 2154 05/17/20 0903   05/17/20 0400  vancomycin (VANCOREADY) IVPB 750 mg/150 mL  Status:  Discontinued        750 mg 150 mL/hr over 60 Minutes Intravenous Every 8 hours 05/16/20 1945 05/16/20 2154   05/16/20 2200  vancomycin (VANCOREADY) IVPB 1250 mg/250 mL        1,250 mg 166.7 mL/hr over 90 Minutes Intravenous STAT 05/16/20 2153 05/16/20 2348   05/16/20 2000  vancomycin (VANCOREADY) IVPB 1250 mg/250 mL  Status:  Discontinued        1,250 mg 166.7 mL/hr over 90 Minutes Intravenous  Once 05/16/20 1945 05/16/20 2153   05/15/20 1600  piperacillin-tazobactam (ZOSYN) IVPB 3.375 g  Status:  Discontinued        3.375 g 12.5 mL/hr over 240 Minutes Intravenous Every 8 hours 05/15/20 0945 05/30/20 0912   05/15/20 0930  piperacillin-tazobactam (ZOSYN) IVPB 3.375 g        3.375 g 100 mL/hr over 30 Minutes Intravenous  Once 05/15/20 0921 05/15/20 1009   05/13/20 2000  azithromycin (ZITHROMAX) tablet 500 mg        500 mg Oral Daily 05/13/20 1155 05/14/20 2127   05/10/20 2200  cefTRIAXone (ROCEPHIN) 2 g in sodium chloride 0.9 % 100 mL IVPB  Status:  Discontinued        2 g 200 mL/hr over 30 Minutes Intravenous Every 24 hours 05/10/20 2148 05/15/20 0911   05/10/20 2200  azithromycin (ZITHROMAX) 500 mg in sodium chloride 0.9 % 250 mL IVPB  Status:  Discontinued        500 mg 250 mL/hr over 60 Minutes Intravenous Every 24 hours 05/10/20 2148 05/13/20 1155      Subjective: Patient sitting up in chair in no acute distress no events overnight  Objective: Vitals:   06/07/20 0342 06/07/20 0500 06/07/20 0737 06/07/20 1156  BP: (!) 141/89  (!) 144/90 131/85  Pulse: (!) 112  (!) 117 (!) 103  Resp: (!) 27  (!) 22 (!) 30  Temp: 98.4 F (36.9 C)  99.5 F (37.5 C) 98.4 F (36.9 C)  TempSrc: Oral  Oral Oral  SpO2: 98%  97% 97%  Weight:  60.2 kg    Height:        Intake/Output Summary (Last 24 hours) at 06/07/2020 1450 Last data filed at  06/07/2020 0000 Gross per 24 hour  Intake 1680 ml  Output --  Net 1680 ml   Filed Weights   06/05/20 0335 06/06/20 0622 06/07/20 0500  Weight: 59.8 kg 60.4 kg 60.2 kg    Examination:  General exam: Appears calm and  comfortable  Respiratory system: Scattered rhonchi bilaterally to auscultation. Respiratory effort normal. Cardiovascular system: S1 & S2 heard, tachycardic . No JVD, murmurs, rubs, gallops or clicks. No pedal edema. Gastrointestinal system: Abdomen is nondistended, soft and nontender. No organomegaly or masses felt. Normal bowel sounds heard. Central nervous system: Alert and oriented. No focal neurological deficits. Extremities: Symmetric 5 x 5 power. Skin: No rashes, lesions or ulcers Psychiatry: Judgement and insight appear normal. Mood & affect appropriate.     Data Reviewed: I have personally reviewed following labs and imaging studies  CBC: Recent Labs  Lab 06/01/20 0019 06/02/20 0038 06/03/20 0121 06/04/20 0118 06/05/20 0106  WBC 15.3* 21.2* 14.9* 9.9 9.2  NEUTROABS 13.5* 18.7* 12.3* 8.1* 7.6  HGB 9.4* 9.4* 9.2* 9.1* 8.4*  HCT 30.8* 30.3* 30.3* 30.0* 27.6*  MCV 85.6 86.6 87.1 84.7 85.2  PLT 739* 688* 697* 581* 956*   Basic Metabolic Panel: Recent Labs  Lab 06/02/20 0038 06/03/20 0121 06/04/20 0118 06/05/20 0106 06/06/20 0017  NA 134* 138 138 134* 134*  K 4.5 4.6 4.4 4.2 4.4  CL 103 103 103 102 104  CO2 24 22 22 22 22   GLUCOSE 151* 123* 138* 145* 152*  BUN 18 19 16 13 13   CREATININE 0.90 0.83 0.86 0.79 0.66  CALCIUM 8.5* 9.0 8.7* 8.5* 8.4*   GFR: Estimated Creatinine Clearance: 83.6 mL/min (by C-G formula based on SCr of 0.66 mg/dL). Liver Function Tests: Recent Labs  Lab 06/01/20 0019 06/02/20 0038 06/03/20 0121 06/04/20 0118 06/05/20 0106  AST 57* 37 72* 96* 89*  ALT 167* 130* 152* 213* 205*  ALKPHOS 266* 262* 275* 295* 274*  BILITOT 0.5 0.4 0.5 0.5 0.4  PROT 7.4 7.4 7.8 7.6 7.4  ALBUMIN 1.9* 1.9* 2.0* 2.0* 1.9*   No  results for input(s): LIPASE, AMYLASE in the last 168 hours. No results for input(s): AMMONIA in the last 168 hours. Coagulation Profile: Recent Labs  Lab 06/02/20 1240 06/02/20 1524  INR 1.2 1.2   Cardiac Enzymes: No results for input(s): CKTOTAL, CKMB, CKMBINDEX, TROPONINI in the last 168 hours. BNP (last 3 results) No results for input(s): PROBNP in the last 8760 hours. HbA1C: No results for input(s): HGBA1C in the last 72 hours. CBG: Recent Labs  Lab 06/06/20 1640 06/06/20 2128 06/07/20 0548 06/07/20 0739 06/07/20 1156  GLUCAP 119* 122* 141* 132* 136*   Lipid Profile: No results for input(s): CHOL, HDL, LDLCALC, TRIG, CHOLHDL, LDLDIRECT in the last 72 hours. Thyroid Function Tests: No results for input(s): TSH, T4TOTAL, FREET4, T3FREE, THYROIDAB in the last 72 hours. Anemia Panel: No results for input(s): VITAMINB12, FOLATE, FERRITIN, TIBC, IRON, RETICCTPCT in the last 72 hours. Sepsis Labs: Recent Labs  Lab 06/02/20 1524 06/02/20 1635  LATICACIDVEN 1.5 1.6    Recent Results (from the past 240 hour(s))  Culture, blood (routine x 2)     Status: None   Collection Time: 06/01/20 10:28 AM   Specimen: BLOOD LEFT ARM  Result Value Ref Range Status   Specimen Description BLOOD LEFT ARM  Final   Special Requests BOTTLES DRAWN AEROBIC ONLY  Final   Culture   Final    NO GROWTH 5 DAYS Performed at [facility], [place] [address] [place], [place] [address]    Report Status 06/06/2020 FINAL  Final  Culture, blood (routine x 2)     Status: None   Collection Time: 06/01/20 10:30 AM   Specimen: BLOOD LEFT HAND  Result Value Ref Range Status  Specimen Description BLOOD LEFT HAND  Final   Special Requests   Final    BOTTLES DRAWN AEROBIC ONLY Blood Culture adequate volume   Culture   Final    NO GROWTH 5 DAYS Performed at [facility], [address] [place], [place] [address]    Report Status 06/06/2020 FINAL  Final  Culture, blood (x 2)     Status: None    Collection Time: 06/02/20  3:18 PM   Specimen: BLOOD LEFT HAND  Result Value Ref Range Status   Specimen Description BLOOD LEFT HAND  Final   Special Requests   Final    BOTTLES DRAWN AEROBIC AND ANAEROBIC Blood Culture adequate volume   Culture   Final    NO GROWTH 5 DAYS Performed at [facility], [place] [address] [place], [place] [address]    Report Status 06/07/2020 FINAL  Final  Culture, blood (x 2)     Status: None   Collection Time: 06/02/20  3:24 PM   Specimen: BLOOD RIGHT HAND  Result Value Ref Range Status   Specimen Description BLOOD RIGHT HAND  Final   Special Requests   Final    BOTTLES DRAWN AEROBIC AND ANAEROBIC Blood Culture adequate volume   Culture   Final    NO GROWTH 5 DAYS Performed at [facility], [place] [address] [place], [place] [address]    Report Status 06/07/2020 FINAL  Final         Radiology Studies: No results found.      Scheduled Meds: . bisacodyl  10 mg Oral Daily  . feeding supplement  1 Container Oral BID BM  . feeding supplement (PROSource TF)  45 mL Per Tube BID  . free water  150 mL Per Tube Q4H  . insulin aspart  0-24 Units Subcutaneous TID AC & HS  . mouth rinse  15 mL Mouth Rinse q12n4p  . metoprolol tartrate  50 mg Per Tube BID  . pantoprazole (PROTONIX) IV  40 mg Intravenous Q12H  . sennosides  5 mL Per Tube QHS   Continuous Infusions: . sodium chloride 1,000 mL (06/06/20 0028)  . sodium chloride    . sodium chloride    . ceFEPime (MAXIPIME) IV 2 g (06/07/20 1427)  . feeding supplement (OSMOLITE 1.5 CAL) 1,000 mL (06/07/20 0702)     LOS: 27 days     Georgette Shell, MD  06/07/2020, 2:50 PM

## 2020-06-08 ENCOUNTER — Ambulatory Visit: Payer: Medicaid Other | Admitting: Thoracic Surgery (Cardiothoracic Vascular Surgery)

## 2020-06-08 LAB — GLUCOSE, CAPILLARY
Glucose-Capillary: 103 mg/dL — ABNORMAL HIGH (ref 70–99)
Glucose-Capillary: 117 mg/dL — ABNORMAL HIGH (ref 70–99)
Glucose-Capillary: 117 mg/dL — ABNORMAL HIGH (ref 70–99)
Glucose-Capillary: 127 mg/dL — ABNORMAL HIGH (ref 70–99)
Glucose-Capillary: 144 mg/dL — ABNORMAL HIGH (ref 70–99)

## 2020-06-08 MED ORDER — APIXABAN 5 MG PO TABS
5.0000 mg | ORAL_TABLET | Freq: Two times a day (BID) | ORAL | Status: DC
  Administered 2020-06-08 – 2020-06-28 (×42): 5 mg via ORAL
  Filled 2020-06-08 (×43): qty 1

## 2020-06-08 NOTE — Social Work (Signed)
8/27:CSW wanted to speak to mom of patient about two bed offers, [facility]. Spoke with [facility] who stated they can take pt but won't have a bed until late next week. Tried to call mom of pt, Evan Chavez [phone], but her voicemail is not set up.

## 2020-06-08 NOTE — Progress Notes (Signed)
Physical Therapy Treatment Patient Details Name: Evan Chavez MRN: [id number] DOB: 10/31/1960 Today's Date: 06/08/2020    History of Present Illness Pt is 59 yo male presents to the hospital on 7/28 with generalized weakness, productive cough and low-grade fever. Pt is currently being treated for hydropneumothorax, Sepsis aspiration pneumonia and esophagal rupture, AKI and acute delirium. s/p R thoracentesis on 8/2. Chest tube placed 8/2-8/9. s/p VATS, decortication, EGD, PEG tube placement, and a esophageal stent on 8/9. PMH of GERD, Barrett's esophagus, PUD with extensive gastric erosion, duodenal ulcer and esophageal stricture, stroke, HLD, BPH, cocaine abuse. s/p Right thoracoscopy, chest tube placement, esophageal stent placement 8/13-8/20.    PT Comments    Patient progressing slowly towards PT goals. Pt sleepy upon arrival finally opening eyes after a few minutes. Nods to answer questions with no verbalizations today. Requires Min A for bed mobility and standing with use of RW. Able to stand for ~5-6 minutes for pericare as pt sitting in stool without awareness. Agreeable to taking a few steps to transfer to chair and there ex but declined further ambulation. VSS on RA. Less coughing noted today. Will follow.    Follow Up Recommendations  SNF     Equipment Recommendations  3in1 (PT);Rolling walker with 5" wheels    Recommendations for Other Services       Precautions / Restrictions Precautions Precautions: Fall Precaution Comments: PEG Restrictions Weight Bearing Restrictions: No    Mobility  Bed Mobility Overal bed mobility: Needs Assistance Bed Mobility: Supine to Sit     Supine to sit: HOB elevated;Min assist     General bed mobility comments: Able to get to EOB with Min A for trunk; increased time and use of rail.  Transfers Overall transfer level: Needs assistance Equipment used: Rolling walker (2 wheeled) Transfers: Sit to/from Stand Sit to Stand: Min  assist Stand pivot transfers: Min guard       General transfer comment: Assist to power to standing with cues for hand placement/technique. Stood from [employer], able to take a few steps to get to chair. Declined walking today.  Ambulation/Gait             General Gait Details: Declined   Stairs             Wheelchair Mobility    Modified Rankin (Stroke Patients Only)       Balance Overall balance assessment: Needs assistance Sitting-balance support: Feet supported;No upper extremity supported Sitting balance-Leahy Scale: Fair Sitting balance - Comments: Close min gaurd for safety. No UE support needed today. Tolerated there ex sitting EOB without LOB.   Standing balance support: During functional activity Standing balance-Leahy Scale: Poor Standing balance comment: Requires UE support in standing. Able to stand for~5-77minutes for pericare. VSS on RA.                            Cognition Arousal/Alertness: Awake/alert Behavior During Therapy: Flat affect Overall Cognitive Status: Impaired/Different from baseline Area of Impairment: Following commands;Problem solving                       Following Commands: Follows one step commands consistently;Follows one step commands with increased time     Problem Solving: Slow processing;Decreased initiation;Requires verbal cues General Comments: Pt with very little verbalization. Requires repetition. Found sitting in stool wtihout awareness.      Exercises General Exercises - Lower Extremity Ankle Circles/Pumps: AROM;Both;10 reps;Supine Long  Arc Quad: AROM;Both;10 reps;Seated    General Comments General comments (skin integrity, edema, etc.): VSS on RA. Less coughing noted today.      Pertinent Vitals/Pain Pain Assessment: Faces Faces Pain Scale: No hurt    Home Living                      Prior Function            PT Goals (current goals can now be found in the care plan  section) Progress towards PT goals: Progressing toward goals (very slowly)    Frequency    Min 2X/week      PT Plan Current plan remains appropriate;Frequency needs to be updated    Co-evaluation              AM-PAC PT "6 Clicks" Mobility   Outcome Measure  Help needed turning from your back to your side while in a flat bed without using bedrails?: A Little Help needed moving from lying on your back to sitting on the side of a flat bed without using bedrails?: A Little Help needed moving to and from a bed to a chair (including a wheelchair)?: A Little Help needed standing up from a chair using your arms (e.g., wheelchair or bedside chair)?: A Little Help needed to walk in hospital room?: A Little Help needed climbing 3-5 steps with a railing? : A Lot 6 Click Score: 17    End of Session Equipment Utilized During Treatment: Gait belt Activity Tolerance: Patient limited by fatigue Patient left: in chair;with call bell/phone within reach;with chair alarm set Nurse Communication: Mobility status PT Visit Diagnosis: Unsteadiness on feet (R26.81);Muscle weakness (generalized) (M62.81)     Time: 2694-8546 PT Time Calculation (min) (ACUTE ONLY): 19 min  Charges:  $Therapeutic Activity: 8-22 mins                     Marisa Severin, PT, DPT Acute Rehabilitation Services Pager [phone] Office [phone]       Marguarite Arbour A Sabra Heck 06/08/2020, 1:51 PM

## 2020-06-08 NOTE — Progress Notes (Signed)
      [address]       [place] [address]             [phone]       Chart reviewed. Working on SNF placement. Stent removal in 3-4 weeks per Dr. Kipp Brood.    Nicholes Rough, PA-C

## 2020-06-08 NOTE — Progress Notes (Addendum)
PROGRESS NOTE    Evan Chavez  JWJ:[id number] DOB: 04-15-1961 DOA: 05/10/2020 PCP: Rosita Fire, MD    Brief Narrative: Evan Chavez is a 59 y.o. year old male with medical history significant for GERD, Barrett's esophagus, peptic ulcer disease with extensive gastric erosion, duodenal ulcerand esophageal stricture status post balloon dilatation on 12/2018, also with history of prior stroke, hyperlipidemia, BPH, remote cocaine abuse and prior CVA who presented on 05/10/2020 to [facility] with generalized weakness, productive cough and low-grade fever shortness of breath initially admitted for pneumonia based off CTA chest showing bibasilar consolidation pleural effusions treated with ceftriaxone and azithromycin. Hospital course complicated by right-sided hydropneumothorax with possible lung collapse after right thoracentesis on 8/2, esophageal perforation (CT chest on 8/4)With large right-sided hydropneumothorax, complicated by aspiration pneumonia.  Patient underwent right-sided chest tube placement was transferred from [facility] to [facility] where he underwent VATS and decortication by CT surgery as well as esophageal stent placement and PEG tube placement on 8/4-8/5.  Patient has been managed on hospitalist service since.  Did have episodes of persistent leakage from esophagus on esophagram as well as requiring right thorascopic for drainage of loculated pleural effusion on 8/13 by CT surgery,  repeat esophagram on 8/16 did not show any esophageal leak. Currently hospitalist service is following as a consult and CT surgery is primary team.  Patient was restarted on broad spectrum antibiotics on 8/21 due to sepsis secondary to RLL pna evident on CXR and CT chest. His known pleural effusion showed slight decrease in size with appropriate position of chest tube on last CT chest on 8/21. Given his persistent tachycardia and tachypnea repeat CXR obtained on 8/23 again noted stable RLL Pna  and atlectasis.  Currently hospital course complicated by odynophagia/dysphagia and pocketing of oral secretions and poor adherence to pulmonary toilet. Speech is evaluating and assisting with planned FEES.    Assessment & Plan:   Active Problems:   Essential hypertension   Atrial tachycardia (HCC)   Dyslipidemia   CAP (community acquired pneumonia)   Transaminitis   Lobar pneumonia ([place])   Sepsis due to undetermined organism ([place])   Fever   Gastroesophageal reflux disease with esophagitis   Shortness of breath   Thyroid nodule   BPH (benign prostatic hyperplasia)   Loculated pleural effusion   Ureteral stone with hydronephrosis   Pneumothorax on right   Acute respiratory failure with hypoxia (HCC)   Chronic bilateral pleural effusions   Parapneumonic effusion   Malnutrition of moderate degree   Esophageal perforation   Pressure injury of skin   Atrial fibrillation with RVR (HCC)   Esophageal anastomotic leak   Atelectasis   Right lower lobe pulmonary infiltrate   Sinus tachycardia   Dysphagia   Respiratory distress   Status post thoracentesis   #1 acute hypoxic respiratory failure secondary to pneumonia/parapneumonic effusion/right apical pneumothorax status post chest tube placement and chest tube removed 06/01/2020 with no pneumothorax on repeat chest x-ray.  Patient completed a course of fluconazole for Candida para silicosis.  #2 sepsis present on admission due to esophageal rupture aspiration pneumonia and mediastinitis.  CT chest at the time of admission shows right lower lobe effusion and empyema.  Continue cefepime.  Last dose of cefepime 06/08/2020 Vanco stopped.  MRSA negative.  #3 esophageal rupture status post chest tube which was removed 06/01/2020.  #4 oropharyngeal dysphagia continue tube feeds  #5 acute on chronic blood loss anemia stable.  This is thought to be secondary to  blood loss from recent esophageal rupture and surgery as well as hematemesis.      #6 severe deconditioning seen by PT recommends SNF.  Case manager aware.  #7 history of paroxysmal atrial fibrillation currently in sinus rhythm.  Was seen by cardiology recommended to continue metoprolol 50 twice a day and to discontinue diltiazem. Cardiology recommends DOAC when he is able to tolerate it. We will start Eliquis consult pharmacy has had no active blood loss.  #8 chronic sinus tachycardia CT chest 729 no PE.  Continue beta-blocker.  #9 history of prior stroke-aspirin on hold  Restart Lipitor prior to discharge, Lipitor on hold due to elevated LFTs.  #10 abnormal LFTs trending down continue to hold statins  #11 left hydronephrosis seen by urology recommended to continue original plan of shockwave lithotripsy as an outpatient.  This was not done during this admission due to patient's worsening respiratory status.  Urology will follow up with the patient as an outpatient.  #12 history of stroke with right-sided weakness residual.  #13 pressure injury stage II to the right buttocks see below  Pressure Injury 05/17/20 Buttocks Right Stage 2 -  Partial thickness loss of dermis presenting as a shallow open injury with a red, pink wound bed without slough. (Active)  05/17/20 0800  Location: Buttocks  Location Orientation: Right  Staging: Stage 2 -  Partial thickness loss of dermis presenting as a shallow open injury with a red, pink wound bed without slough.  Wound Description (Comments):   Present on Admission:       Nutrition Problem: Moderate Malnutrition Etiology: chronic illness (Barrett's esophagus, GERD)     Signs/Symptoms: moderate fat depletion, moderate muscle depletion    Interventions: Tube feeding  Estimated body mass index is 19.6 kg/m as calculated from the following:   Height as of this encounter: 5\' 9"  (1.753 m).   Weight as of this encounter: 60.2 kg.  DVT prophylaxis: scd/consult pharmacy for Eliquis for A. fib Code Status:full Family  Communication: none at bedside Disposition patient is medically stable to be discharged however he needs to go to SNF and has insurance issues as a barrier for discharge.  Case manager aware.  Procedures: chest tube Antimicrobials:  Anti-infectives (From admission, onward)   Start     Dose/Rate Route Frequency Ordered Stop   06/03/20 0200  vancomycin (VANCOCIN) IVPB 1000 mg/200 mL premix  Status:  Discontinued        1,000 mg 200 mL/hr over 60 Minutes Intravenous Every 12 hours 06/02/20 1248 06/05/20 0915   06/02/20 2200  ceFEPIme (MAXIPIME) 2 g in sodium chloride 0.9 % 100 mL IVPB        2 g 200 mL/hr over 30 Minutes Intravenous Every 8 hours 06/02/20 1251 06/08/20 2359   06/02/20 2000  piperacillin-tazobactam (ZOSYN) IVPB 3.375 g  Status:  Discontinued        3.375 g 12.5 mL/hr over 240 Minutes Intravenous Every 8 hours 06/02/20 1248 06/02/20 1249   06/02/20 1330  vancomycin (VANCOREADY) IVPB 1250 mg/250 mL        1,250 mg 166.7 mL/hr over 90 Minutes Intravenous  Once 06/02/20 1248 06/02/20 1646   06/02/20 1330  piperacillin-tazobactam (ZOSYN) IVPB 3.375 g  Status:  Discontinued        3.375 g 100 mL/hr over 30 Minutes Intravenous  Once 06/02/20 1248 06/02/20 1249   06/02/20 1215  vancomycin (VANCOCIN) IVPB 1000 mg/200 mL premix  Status:  Discontinued        1,000 mg  200 mL/hr over 60 Minutes Intravenous  Once 06/02/20 1202 06/02/20 1251   06/02/20 1215  ceFEPIme (MAXIPIME) 2 g in sodium chloride 0.9 % 100 mL IVPB        2 g 200 mL/hr over 30 Minutes Intravenous  Once 06/02/20 1202 06/02/20 1506   05/29/20 1800  vancomycin (VANCOCIN) IVPB 1000 mg/200 mL premix  Status:  Discontinued        1,000 mg 200 mL/hr over 60 Minutes Intravenous Every 12 hours 05/29/20 1151 05/30/20 0912   05/24/20 2100  vancomycin (VANCOREADY) IVPB 750 mg/150 mL  Status:  Discontinued        750 mg 150 mL/hr over 60 Minutes Intravenous Every 12 hours 05/24/20 0813 05/29/20 1151   05/24/20 0900  vancomycin  (VANCOREADY) IVPB 1500 mg/300 mL        1,500 mg 150 mL/hr over 120 Minutes Intravenous  Once 05/24/20 0813 05/24/20 1221   05/17/20 0900  fluconazole (DIFLUCAN) IVPB 400 mg  Status:  Discontinued        400 mg 100 mL/hr over 120 Minutes Intravenous Every 24 hours 05/17/20 0749 06/05/20 0915   05/17/20 0600  vancomycin (VANCOREADY) IVPB 750 mg/150 mL  Status:  Discontinued        750 mg 150 mL/hr over 60 Minutes Intravenous Every 8 hours 05/16/20 2154 05/17/20 0903   05/17/20 0400  vancomycin (VANCOREADY) IVPB 750 mg/150 mL  Status:  Discontinued        750 mg 150 mL/hr over 60 Minutes Intravenous Every 8 hours 05/16/20 1945 05/16/20 2154   05/16/20 2200  vancomycin (VANCOREADY) IVPB 1250 mg/250 mL        1,250 mg 166.7 mL/hr over 90 Minutes Intravenous STAT 05/16/20 2153 05/16/20 2348   05/16/20 2000  vancomycin (VANCOREADY) IVPB 1250 mg/250 mL  Status:  Discontinued        1,250 mg 166.7 mL/hr over 90 Minutes Intravenous  Once 05/16/20 1945 05/16/20 2153   05/15/20 1600  piperacillin-tazobactam (ZOSYN) IVPB 3.375 g  Status:  Discontinued        3.375 g 12.5 mL/hr over 240 Minutes Intravenous Every 8 hours 05/15/20 0945 05/30/20 0912   05/15/20 0930  piperacillin-tazobactam (ZOSYN) IVPB 3.375 g        3.375 g 100 mL/hr over 30 Minutes Intravenous  Once 05/15/20 0921 05/15/20 1009   05/13/20 2000  azithromycin (ZITHROMAX) tablet 500 mg        500 mg Oral Daily 05/13/20 1155 05/14/20 2127   05/10/20 2200  cefTRIAXone (ROCEPHIN) 2 g in sodium chloride 0.9 % 100 mL IVPB  Status:  Discontinued        2 g 200 mL/hr over 30 Minutes Intravenous Every 24 hours 05/10/20 2148 05/15/20 0911   05/10/20 2200  azithromycin (ZITHROMAX) 500 mg in sodium chloride 0.9 % 250 mL IVPB  Status:  Discontinued        500 mg 250 mL/hr over 60 Minutes Intravenous Every 24 hours 05/10/20 2148 05/13/20 1155      Subjective: Patient resting in bed he is awake alert answering questions appropriately and the  best way he could.  Has a lot of secretions that he is trying to suction it out by himself.  Right-sided weakness chronic. Objective: Vitals:   06/08/20 0548 06/08/20 0600 06/08/20 0733 06/08/20 1103  BP:  131/86 130/84 125/87  Pulse:  (!) 116 (!) 109 98  Resp:  (!) 25 (!) 33 (!) 34  Temp:   99.1 F (37.3 C)  98.3 F (36.8 C)  TempSrc:   Oral Oral  SpO2:  96% 98% 100%  Weight: 60.2 kg     Height:        Intake/Output Summary (Last 24 hours) at 06/08/2020 1110 Last data filed at 06/08/2020 0415 Gross per 24 hour  Intake --  Output 1275 ml  Net -1275 ml   Filed Weights   06/06/20 0622 06/07/20 0500 06/08/20 0548  Weight: 60.4 kg 60.2 kg 60.2 kg    Examination:  General exam: Appears calm and comfortable  Respiratory system: Scattered rhonchi bilaterally to auscultation. Respiratory effort normal. Cardiovascular system: S1 & S2 heard, tachycardic . No JVD, murmurs, rubs, gallops or clicks. No pedal edema. Gastrointestinal system: Abdomen is nondistended, soft and nontender. No organomegaly or masses felt. Normal bowel sounds heard.  PEG tube in place Central nervous system: Alert and oriented chronic right-sided weakness from prior stroke  extremities: Symmetric 5 x 5 power. Skin: No rashes, lesions or ulcers Psychiatry: Judgement and insight appear normal. Mood & affect appropriate.     Data Reviewed: I have personally reviewed following labs and imaging studies  CBC: Recent Labs  Lab 06/02/20 0038 06/03/20 0121 06/04/20 0118 06/05/20 0106  WBC 21.2* 14.9* 9.9 9.2  NEUTROABS 18.7* 12.3* 8.1* 7.6  HGB 9.4* 9.2* 9.1* 8.4*  HCT 30.3* 30.3* 30.0* 27.6*  MCV 86.6 87.1 84.7 85.2  PLT 688* 697* 581* 329*   Basic Metabolic Panel: Recent Labs  Lab 06/02/20 0038 06/03/20 0121 06/04/20 0118 06/05/20 0106 06/06/20 0017  NA 134* 138 138 134* 134*  K 4.5 4.6 4.4 4.2 4.4  CL 103 103 103 102 104  CO2 24 22 22 22 22   GLUCOSE 151* 123* 138* 145* 152*  BUN 18 19 16 13 13    CREATININE 0.90 0.83 0.86 0.79 0.66  CALCIUM 8.5* 9.0 8.7* 8.5* 8.4*   GFR: Estimated Creatinine Clearance: 83.6 mL/min (by C-G formula based on SCr of 0.66 mg/dL). Liver Function Tests: Recent Labs  Lab 06/02/20 0038 06/03/20 0121 06/04/20 0118 06/05/20 0106  AST 37 72* 96* 89*  ALT 130* 152* 213* 205*  ALKPHOS 262* 275* 295* 274*  BILITOT 0.4 0.5 0.5 0.4  PROT 7.4 7.8 7.6 7.4  ALBUMIN 1.9* 2.0* 2.0* 1.9*   No results for input(s): LIPASE, AMYLASE in the last 168 hours. No results for input(s): AMMONIA in the last 168 hours. Coagulation Profile: Recent Labs  Lab 06/02/20 1240 06/02/20 1524  INR 1.2 1.2   Cardiac Enzymes: No results for input(s): CKTOTAL, CKMB, CKMBINDEX, TROPONINI in the last 168 hours. BNP (last 3 results) No results for input(s): PROBNP in the last 8760 hours. HbA1C: No results for input(s): HGBA1C in the last 72 hours. CBG: Recent Labs  Lab 06/07/20 1727 06/07/20 2022 06/07/20 2348 06/08/20 0411 06/08/20 1105  GLUCAP 117* 114* 127* 117* 103*   Lipid Profile: No results for input(s): CHOL, HDL, LDLCALC, TRIG, CHOLHDL, LDLDIRECT in the last 72 hours. Thyroid Function Tests: No results for input(s): TSH, T4TOTAL, FREET4, T3FREE, THYROIDAB in the last 72 hours. Anemia Panel: No results for input(s): VITAMINB12, FOLATE, FERRITIN, TIBC, IRON, RETICCTPCT in the last 72 hours. Sepsis Labs: Recent Labs  Lab 06/02/20 1524 06/02/20 1635  LATICACIDVEN 1.5 1.6    Recent Results (from the past 240 hour(s))  Culture, blood (routine x 2)     Status: None   Collection Time: 06/01/20 10:28 AM   Specimen: BLOOD LEFT ARM  Result Value Ref Range Status   Specimen Description  BLOOD LEFT ARM  Final   Special Requests BOTTLES DRAWN AEROBIC ONLY  Final   Culture   Final    NO GROWTH 5 DAYS Performed at [facility], [place] [address], [place], [place] [address]    Report Status 06/06/2020 FINAL  Final  Culture, blood (routine x 2)     Status:  None   Collection Time: 06/01/20 10:30 AM   Specimen: BLOOD LEFT HAND  Result Value Ref Range Status   Specimen Description BLOOD LEFT HAND  Final   Special Requests   Final    BOTTLES DRAWN AEROBIC ONLY Blood Culture adequate volume   Culture   Final    NO GROWTH 5 DAYS Performed at [facility], [place] [address], [place], [place] [address]    Report Status 06/06/2020 FINAL  Final  Culture, blood (x 2)     Status: None   Collection Time: 06/02/20  3:18 PM   Specimen: BLOOD LEFT HAND  Result Value Ref Range Status   Specimen Description BLOOD LEFT HAND  Final   Special Requests   Final    BOTTLES DRAWN AEROBIC AND ANAEROBIC Blood Culture adequate volume   Culture   Final    NO GROWTH 5 DAYS Performed at [facility], [place] [address] [place], [place] [address]    Report Status 06/07/2020 FINAL  Final  Culture, blood (x 2)     Status: None   Collection Time: 06/02/20  3:24 PM   Specimen: BLOOD RIGHT HAND  Result Value Ref Range Status   Specimen Description BLOOD RIGHT HAND  Final   Special Requests   Final    BOTTLES DRAWN AEROBIC AND ANAEROBIC Blood Culture adequate volume   Culture   Final    NO GROWTH 5 DAYS Performed at [facility], [place] [address] [place], [place] [address]    Report Status 06/07/2020 FINAL  Final         Radiology Studies: No results found.      Scheduled Meds: . bisacodyl  10 mg Oral Daily  . feeding supplement  1 Container Oral BID BM  . feeding supplement (PROSource TF)  45 mL Per Tube BID  . free water  150 mL Per Tube Q4H  . insulin aspart  0-24 Units Subcutaneous TID AC & HS  . mouth rinse  15 mL Mouth Rinse q12n4p  . metoprolol tartrate  50 mg Per Tube BID  . pantoprazole (PROTONIX) IV  40 mg Intravenous Q12H  . sennosides  5 mL Per Tube QHS   Continuous Infusions: . sodium chloride 1,000 mL (06/06/20 0028)  . sodium chloride    . sodium chloride    . ceFEPime (MAXIPIME) IV 2 g (06/08/20 0617)  . feeding  supplement (OSMOLITE 1.5 CAL) 1,000 mL (06/08/20 0017)     LOS: 28 days     Georgette Shell, MD  06/08/2020, 11:10 AM

## 2020-06-08 NOTE — Progress Notes (Signed)
ANTICOAGULATION CONSULT NOTE - Initial Consult  Pharmacy Consult for Eliquis Indication: atrial fibrillation  No Known Allergies  Patient Measurements: Height: 5\' 9"  (175.3 cm) Weight: 60.2 kg (132 lb 11.5 oz) IBW/kg (Calculated) : 70.7  Vital Signs: Temp: 98.3 F (36.8 C) (08/27 1103) Temp Source: Oral (08/27 1103) BP: 125/87 (08/27 1103) Pulse Rate: 98 (08/27 1103)  Labs: Recent Labs    06/06/20 0017  CREATININE 0.66    Estimated Creatinine Clearance: 83.6 mL/min (by C-G formula based on SCr of 0.66 mg/dL).   Medical History: Past Medical History:  Diagnosis Date  . Barrett's esophagus   . Chronic anemia   . Cocaine abuse ([place])   . Duodenal ulcer   . Esophageal stricture   . ETOH abuse   . GERD (gastroesophageal reflux disease)   . Hyperlipidemia   . Hypertension   . Peptic ulcer   . Pneumonia   . Stroke (cerebrum) ([place]) 06/2018   right sided weakness    Medications:  Scheduled:  . bisacodyl  10 mg Oral Daily  . feeding supplement  1 Container Oral BID BM  . feeding supplement (PROSource TF)  45 mL Per Tube BID  . free water  150 mL Per Tube Q4H  . insulin aspart  0-24 Units Subcutaneous TID AC & HS  . mouth rinse  15 mL Mouth Rinse q12n4p  . metoprolol tartrate  50 mg Per Tube BID  . pantoprazole (PROTONIX) IV  40 mg Intravenous Q12H  . sennosides  5 mL Per Tube QHS    Assessment: 59 yo male w/ PAF.  No anticoagulation yet due to hemoptysis,now resolved.  Pharmacy asked to begin Eliquis.  Wt = 60 kg, but age < 33, and Scr 0.66.  Appropriate for full dosing.  Goal of Therapy:  Monitor platelets by anticoagulation protocol: Yes   Plan:  Eliquis 5 mg po BID. Will educate patient prior to discharge.  [place] Crane, Roylene Reason, BCCP Clinical Pharmacist  06/08/2020 1:06 PM   [facility] pharmacy phone numbers are listed on [email or web address]

## 2020-06-08 NOTE — Progress Notes (Signed)
Physical Therapy Treatment Patient Details Name: Evan Chavez MRN: [id number] DOB: [birth year] Today's Date: 06/08/2020    History of Present Illness Pt is 59 yo male presents to the hospital on 7/28 with generalized weakness, productive cough and low-grade fever. Pt is currently being treated for hydropneumothorax, Sepsis aspiration pneumonia and esophagal rupture, AKI and acute delirium. s/p R thoracentesis on 8/2. Chest tube placed 8/2-8/9. s/p VATS, decortication, EGD, PEG tube placement, and a esophageal stent on 8/9. PMH of GERD, Barrett's esophagus, PUD with extensive gastric erosion, duodenal ulcer and esophageal stricture, stroke, HLD, BPH, cocaine abuse. s/p Right thoracoscopy, chest tube placement, esophageal stent placement 8/13-8/20.    PT Comments    Pt coming out of recliner sideways with legrests extended with chair alarm going off. Assisted pt back into chair and then stood with nursing assisting with pericare due to pt incontinent of stool. Amb short distance back to bed.   Follow Up Recommendations  SNF     Equipment Recommendations  3in1 (PT);Rolling walker with 5" wheels    Recommendations for Other Services       Precautions / Restrictions Precautions Precautions: Fall Precaution Comments: PEG Restrictions Weight Bearing Restrictions: No    Mobility  Bed Mobility Overal bed mobility: Needs Assistance Bed Mobility: Sit to Supine     Supine to sit: HOB elevated;Min assist Sit to supine: Min guard   General bed mobility comments: Assist for safety and lines  Transfers Overall transfer level: Needs assistance Equipment used: Rolling walker (2 wheeled) Transfers: Sit to/from Stand Sit to Stand: Min assist Stand pivot transfers: Min guard       General transfer comment: Assist to bring hips up and verbal cues for hand placement  Ambulation/Gait Ambulation/Gait assistance: Min assist Gait Distance (Feet): 3 Feet Assistive device: Rolling  walker (2 wheeled) Gait Pattern/deviations: Step-through pattern;Decreased step length - right;Decreased step length - left;Shuffle;Trunk flexed Gait velocity: decr Gait velocity interpretation: <1.31 ft/sec, indicative of household ambulator General Gait Details: Assist for balance and support   Stairs             Wheelchair Mobility    Modified Rankin (Stroke Patients Only)       Balance Overall balance assessment: Needs assistance Sitting-balance support: Feet supported;No upper extremity supported Sitting balance-Leahy Scale: Fair Sitting balance - Comments: Close min gaurd for safety. No UE support needed today. Tolerated there ex sitting EOB without LOB.   Standing balance support: During functional activity Standing balance-Leahy Scale: Poor Standing balance comment: walker and min assist for static standing                            Cognition Arousal/Alertness: Awake/alert Behavior During Therapy: Flat affect Overall Cognitive Status: Impaired/Different from baseline Area of Impairment: Following commands;Problem solving                       Following Commands: Follows one step commands consistently;Follows one step commands with increased time     Problem Solving: Slow processing;Decreased initiation;Requires verbal cues General Comments: Pt with very little verbalization. Requires repetition.      Exercises General Exercises - Lower Extremity Ankle Circles/Pumps: AROM;Both;10 reps;Supine Long Arc Quad: AROM;Both;10 reps;Seated    General Comments General comments (skin integrity, edema, etc.): VSS on RA      Pertinent Vitals/Pain Pain Assessment: Faces Faces Pain Scale: No hurt    Home Living  Prior Function            PT Goals (current goals can now be found in the care plan section) Progress towards PT goals: Progressing toward goals    Frequency    Min 2X/week      PT Plan  Current plan remains appropriate;Frequency needs to be updated    Co-evaluation              AM-PAC PT "6 Clicks" Mobility   Outcome Measure  Help needed turning from your back to your side while in a flat bed without using bedrails?: A Little Help needed moving from lying on your back to sitting on the side of a flat bed without using bedrails?: A Little Help needed moving to and from a bed to a chair (including a wheelchair)?: A Little Help needed standing up from a chair using your arms (e.g., wheelchair or bedside chair)?: A Little Help needed to walk in hospital room?: A Little Help needed climbing 3-5 steps with a railing? : A Lot 6 Click Score: 17    End of Session Equipment Utilized During Treatment: Gait belt Activity Tolerance: Patient limited by fatigue Patient left: with call bell/phone within reach;in bed;with bed alarm set Nurse Communication: Mobility status PT Visit Diagnosis: Unsteadiness on feet (R26.81);Muscle weakness (generalized) (M62.81)     Time: 3825-0539 PT Time Calculation (min) (ACUTE ONLY): 12 min  Charges:  $Therapeutic Activity: 8-22 mins                     [place] Pager [phone] Office [place] 06/08/2020, 4:40 PM

## 2020-06-08 NOTE — Progress Notes (Signed)
  Speech Language Pathology Treatment: Dysphagia  Patient Details Name: Evan Chavez MRN: [id number] DOB: 1961-09-12 Today's Date: 06/08/2020 Time: 1510-1520 SLP Time Calculation (min) (ACUTE ONLY): 10 min  Assessment / Plan / Recommendation Clinical Impression  Pt was seen for dysphagia treatment. He required encouragement to participate in the session and verbal output was limited. Pt exhibited difficulty communicating due to his holding secretions in his mouth but communicated more readily following oral suctioning. Pt agreed to drink Sprite but refused boluses from cup and straw. He accepted a single bolus of Sprite via tsp and no s/sx of aspiration were noted. Twenty seconds passed prior to any hyolarylaryngeal movement or palpation of lingual movement, suggesting oral holding. Pt refused all additional boluses or therapeutic intervention despite encouragement. SLP will continue to follow pt.    HPI HPI: Evan Chavez is a 59 y.o. year old male initally admitted with generalized weakness, productive cough and low-grade fever shortness of breath initially admitted for pneumonia based off CTA chest showing bibasilar consolidation pleural effusions treated with ceftriaxone and azithromycin. Pt had an MBS on 05/14/20 (prior to any notes indicating esopahgeal perforation) that showed mild oropharyngeal dysphagia with only premature spillage, mild oral residue. Pt recommended to consume regualr/thin.  Study reviewed by Dr. Elsworth Soho. On that same day, Pt underwent a Ultrasound-guided thoracentesis to define parapneumonic effusion, suspected to be from aspiration pna due to esophageal stricture. Found to have RT apical pneumothorax. Pt was then transferred to [facility] for a VATS decortication (not done?) and Chest tube placed. Post procedure CT chest performed which showed esophageal perforation. CTCS then reported "He has a long history of peptic strictures that have been dilated on multiple occasions.  It  appears as though this may be the culprit for his perforation."  Underwent esophageal stent placement and PEG tube placement on 8/4-8/5.  Did have episodes of persistent leakage from esophagus on esophagram as well as requiring right thorascopic for drainage of loculated pleural effusion on 8/13 by CT surgery,  repeat esophagram on 8/16 did not show any esophageal leak. Pt has not been accepting oral PO, or has been orally holding his secretions. Has also been treated for candida. PMH: GERD, Barrett's esophagus, peptic ulcer disease with extensive gastric erosion, duodenal ulcerand esophageal stricture status post balloon dilatation on 12/2018, also with history of prior stroke, hyperlipidemia, BPH, remote cocaine abuse and prior CVA.      SLP Plan  Continue with current plan of care       Recommendations  Diet recommendations: Thin liquid Medication Administration: Whole meds with liquid Compensations: Slow rate;Small sips/bites Postural Changes and/or Swallow Maneuvers: Seated upright 90 degrees                Oral Care Recommendations: Oral care QID Follow up Recommendations: Skilled Nursing facility SLP Visit Diagnosis: Dysphagia, unspecified (R13.10) Plan: Continue with current plan of care       Aldona Bryner I. Hardin Negus, [place], [place] Office number [phone] Pager [place] 06/08/2020, 5:35 PM

## 2020-06-09 LAB — GLUCOSE, CAPILLARY
Glucose-Capillary: 120 mg/dL — ABNORMAL HIGH (ref 70–99)
Glucose-Capillary: 121 mg/dL — ABNORMAL HIGH (ref 70–99)
Glucose-Capillary: 135 mg/dL — ABNORMAL HIGH (ref 70–99)
Glucose-Capillary: 140 mg/dL — ABNORMAL HIGH (ref 70–99)
Glucose-Capillary: 145 mg/dL — ABNORMAL HIGH (ref 70–99)
Glucose-Capillary: 151 mg/dL — ABNORMAL HIGH (ref 70–99)

## 2020-06-09 NOTE — TOC Progression Note (Signed)
Transition of Care [facility]) - Progression Note    Patient Details  Name: Evan Chavez MRN: [id number] Date of Birth: 10-17-1960  Transition of Care [facility]) CM/SW [place], [place] Phone Number: 06/09/2020, 10:42 AM  Clinical Narrative:     CSW spoke to pts mother Peter Congo via phone. CSW gave [place] bed offers of [facility] and [facility]. CSW gave Peter Congo the rating scale of both facilities. Per [employer] request  Google maps to gave her an estimated driving distance from her home.   Peter Congo requested time to review choices with her other two children. CSW informed Peter Congo that she will have tomorrows CSW reach out about a decision.   Expected Discharge Plan: [place] Barriers to Discharge: Inadequate or no insurance, Ship broker, Continued Medical Work up  Expected Discharge Plan and Services Expected Discharge Plan: [place] arrangements for the past 2 months: [place] Determinants of Health (SDOH) Interventions    Readmission Risk Interventions Readmission Risk Prevention Plan 06/09/2020  Transportation Screening Complete  PCP or Specialist Appt within 3-5 Days Complete  HRI or [place] Patient refused  Social Work Consult for [place] Planning/Counseling Complete  Palliative Care Screening Not Applicable  Medication Review Press photographer) Referral to Pharmacy  Some recent data might be hidden   Emeterio Reeve, Latanya Presser, [place] Social Worker [phone]

## 2020-06-09 NOTE — Progress Notes (Addendum)
PROGRESS NOTE    Evan Chavez  FTD:[id number] DOB: 13-Aug-1961 DOA: 05/10/2020 PCP: Rosita Fire, MD    Brief Narrative: Evan Chavez is a 59 y.o. year old male with medical history significant for GERD, Barrett's esophagus, peptic ulcer disease with extensive gastric erosion, duodenal ulcerand esophageal stricture status post balloon dilatation on 12/2018, also with history of prior stroke, hyperlipidemia, BPH, remote cocaine abuse and prior CVA who presented on 05/10/2020 to [facility] with generalized weakness, productive cough and low-grade fever shortness of breath initially admitted for pneumonia based off CTA chest showing bibasilar consolidation pleural effusions treated with ceftriaxone and azithromycin. Hospital course complicated by right-sided hydropneumothorax with possible lung collapse after right thoracentesis on 8/2, esophageal perforation (CT chest on 8/4)With large right-sided hydropneumothorax, complicated by aspiration pneumonia.  Patient underwent right-sided chest tube placement was transferred from [facility] to [facility] where he underwent VATS and decortication by CT surgery as well as esophageal stent placement and PEG tube placement on 8/4-8/5.  Patient has been managed on hospitalist service since.  Did have episodes of persistent leakage from esophagus on esophagram as well as requiring right thorascopic for drainage of loculated pleural effusion on 8/13 by CT surgery,  repeat esophagram on 8/16 did not show any esophageal leak. Currently hospitalist service is following as a consult and CT surgery is primary team.  Patient was restarted on broad spectrum antibiotics on 8/21 due to sepsis secondary to RLL pna evident on CXR and CT chest. His known pleural effusion showed slight decrease in size with appropriate position of chest tube on last CT chest on 8/21. Given his persistent tachycardia and tachypnea repeat CXR obtained on 8/23 again noted stable RLL Pna  and atlectasis.  Currently hospital course complicated by odynophagia/dysphagia and pocketing of oral secretions and poor adherence to pulmonary toilet. Speech is evaluating and assisting with planned FEES.    Assessment & Plan:   Active Problems:   Essential hypertension   Atrial tachycardia (HCC)   Dyslipidemia   CAP (community acquired pneumonia)   Transaminitis   Lobar pneumonia ([place])   Sepsis due to undetermined organism ([place])   Fever   Gastroesophageal reflux disease with esophagitis   Shortness of breath   Thyroid nodule   BPH (benign prostatic hyperplasia)   Loculated pleural effusion   Ureteral stone with hydronephrosis   Pneumothorax on right   Acute respiratory failure with hypoxia (HCC)   Chronic bilateral pleural effusions   Parapneumonic effusion   Malnutrition of moderate degree   Esophageal perforation   Pressure injury of skin   Atrial fibrillation with RVR (HCC)   Esophageal anastomotic leak   Atelectasis   Right lower lobe pulmonary infiltrate   Sinus tachycardia   Dysphagia   Respiratory distress   Status post thoracentesis   #1 acute hypoxic respiratory failure secondary to pneumonia/parapneumonic effusion/right apical pneumothorax status post chest tube placement and chest tube removed 06/01/2020 with no pneumothorax on repeat chest x-ray.  Patient completed a course of fluconazole for Candida para silicosis.  #2 sepsis present on admission due to esophageal rupture aspiration pneumonia and mediastinitis.  CT chest at the time of admission shows right lower lobe effusion and empyema.  Continue cefepime.  Last dose of cefepime 06/08/2020 Vanco stopped.  MRSA negative.  #3 esophageal rupture status post chest tube which was removed 06/01/2020.  #4 oropharyngeal dysphagia continue tube feeds  #5 acute on chronic blood loss anemia stable.  This is thought to be secondary to  blood loss from recent esophageal rupture and surgery as well as hematemesis.      #6 severe deconditioning seen by PT recommends SNF.  Case manager aware.  #7 history of paroxysmal atrial fibrillation currently in sinus rhythm.  Was seen by cardiology recommended to continue metoprolol 50 twice a day and to discontinue diltiazem. Cardiology recommends DOAC when he is able to tolerate it. We will start Eliquis consult pharmacy has had no active blood loss.  #8 chronic sinus tachycardia CT chest 729 no PE.  Continue beta-blocker.  #9 history of prior stroke-aspirin on hold  Restart Lipitor prior to discharge, Lipitor on hold due to elevated LFTs.  #10 abnormal LFTs trending down continue to hold statins  #11 left hydronephrosis seen by urology recommended to continue original plan of shockwave lithotripsy as an outpatient.  This was not done during this admission due to patient's worsening respiratory status.  Urology will follow up with the patient as an outpatient.  #12 history of stroke with right-sided weakness residual.  #13 pressure injury stage II to the right buttocks see below  Pressure Injury 05/17/20 Buttocks Right Stage 2 -  Partial thickness loss of dermis presenting as a shallow open injury with a red, pink wound bed without slough. (Active)  05/17/20 0800  Location: Buttocks  Location Orientation: Right  Staging: Stage 2 -  Partial thickness loss of dermis presenting as a shallow open injury with a red, pink wound bed without slough.  Wound Description (Comments):   Present on Admission:       Nutrition Problem: Moderate Malnutrition Etiology: chronic illness (Barrett's esophagus, GERD)     Signs/Symptoms: moderate fat depletion, moderate muscle depletion    Interventions: Tube feeding  Estimated body mass index is 19.5 kg/m as calculated from the following:   Height as of this encounter: 5\' 9"  (1.753 m).   Weight as of this encounter: 59.9 kg.  DVT prophylaxis: scd/consult pharmacy for Eliquis for A. fib Code Status:full Family  Communication: none at bedside Disposition patient is medically stable to be discharged however he needs to go to SNF and has insurance issues as a barrier for discharge.  Case manager aware.  Procedures: chest tube Antimicrobials:  Anti-infectives (From admission, onward)   Start     Dose/Rate Route Frequency Ordered Stop   06/03/20 0200  vancomycin (VANCOCIN) IVPB 1000 mg/200 mL premix  Status:  Discontinued        1,000 mg 200 mL/hr over 60 Minutes Intravenous Every 12 hours 06/02/20 1248 06/05/20 0915   06/02/20 2200  ceFEPIme (MAXIPIME) 2 g in sodium chloride 0.9 % 100 mL IVPB        2 g 200 mL/hr over 30 Minutes Intravenous Every 8 hours 06/02/20 1251 06/08/20 2359   06/02/20 2000  piperacillin-tazobactam (ZOSYN) IVPB 3.375 g  Status:  Discontinued        3.375 g 12.5 mL/hr over 240 Minutes Intravenous Every 8 hours 06/02/20 1248 06/02/20 1249   06/02/20 1330  vancomycin (VANCOREADY) IVPB 1250 mg/250 mL        1,250 mg 166.7 mL/hr over 90 Minutes Intravenous  Once 06/02/20 1248 06/02/20 1646   06/02/20 1330  piperacillin-tazobactam (ZOSYN) IVPB 3.375 g  Status:  Discontinued        3.375 g 100 mL/hr over 30 Minutes Intravenous  Once 06/02/20 1248 06/02/20 1249   06/02/20 1215  vancomycin (VANCOCIN) IVPB 1000 mg/200 mL premix  Status:  Discontinued        1,000 mg  200 mL/hr over 60 Minutes Intravenous  Once 06/02/20 1202 06/02/20 1251   06/02/20 1215  ceFEPIme (MAXIPIME) 2 g in sodium chloride 0.9 % 100 mL IVPB        2 g 200 mL/hr over 30 Minutes Intravenous  Once 06/02/20 1202 06/02/20 1506   05/29/20 1800  vancomycin (VANCOCIN) IVPB 1000 mg/200 mL premix  Status:  Discontinued        1,000 mg 200 mL/hr over 60 Minutes Intravenous Every 12 hours 05/29/20 1151 05/30/20 0912   05/24/20 2100  vancomycin (VANCOREADY) IVPB 750 mg/150 mL  Status:  Discontinued        750 mg 150 mL/hr over 60 Minutes Intravenous Every 12 hours 05/24/20 0813 05/29/20 1151   05/24/20 0900  vancomycin  (VANCOREADY) IVPB 1500 mg/300 mL        1,500 mg 150 mL/hr over 120 Minutes Intravenous  Once 05/24/20 0813 05/24/20 1221   05/17/20 0900  fluconazole (DIFLUCAN) IVPB 400 mg  Status:  Discontinued        400 mg 100 mL/hr over 120 Minutes Intravenous Every 24 hours 05/17/20 0749 06/05/20 0915   05/17/20 0600  vancomycin (VANCOREADY) IVPB 750 mg/150 mL  Status:  Discontinued        750 mg 150 mL/hr over 60 Minutes Intravenous Every 8 hours 05/16/20 2154 05/17/20 0903   05/17/20 0400  vancomycin (VANCOREADY) IVPB 750 mg/150 mL  Status:  Discontinued        750 mg 150 mL/hr over 60 Minutes Intravenous Every 8 hours 05/16/20 1945 05/16/20 2154   05/16/20 2200  vancomycin (VANCOREADY) IVPB 1250 mg/250 mL        1,250 mg 166.7 mL/hr over 90 Minutes Intravenous STAT 05/16/20 2153 05/16/20 2348   05/16/20 2000  vancomycin (VANCOREADY) IVPB 1250 mg/250 mL  Status:  Discontinued        1,250 mg 166.7 mL/hr over 90 Minutes Intravenous  Once 05/16/20 1945 05/16/20 2153   05/15/20 1600  piperacillin-tazobactam (ZOSYN) IVPB 3.375 g  Status:  Discontinued        3.375 g 12.5 mL/hr over 240 Minutes Intravenous Every 8 hours 05/15/20 0945 05/30/20 0912   05/15/20 0930  piperacillin-tazobactam (ZOSYN) IVPB 3.375 g        3.375 g 100 mL/hr over 30 Minutes Intravenous  Once 05/15/20 0921 05/15/20 1009   05/13/20 2000  azithromycin (ZITHROMAX) tablet 500 mg        500 mg Oral Daily 05/13/20 1155 05/14/20 2127   05/10/20 2200  cefTRIAXone (ROCEPHIN) 2 g in sodium chloride 0.9 % 100 mL IVPB  Status:  Discontinued        2 g 200 mL/hr over 30 Minutes Intravenous Every 24 hours 05/10/20 2148 05/15/20 0911   05/10/20 2200  azithromycin (ZITHROMAX) 500 mg in sodium chloride 0.9 % 250 mL IVPB  Status:  Discontinued        500 mg 250 mL/hr over 60 Minutes Intravenous Every 24 hours 05/10/20 2148 05/13/20 1155      Subjective: Patient resting in bed he is awake alert answering questions appropriately  No new  issues overnight Objective: Vitals:   06/08/20 2356 06/09/20 0317 06/09/20 0748 06/09/20 1113  BP: 136/82 128/81 132/86 122/85  Pulse: (!) 104 (!) 113 (!) 114 (!) 107  Resp: (!) 23 (!) 25 (!) 24 (!) 25  Temp: 98.8 F (37.1 C) 99.2 F (37.3 C) 99.1 F (37.3 C) 98.3 F (36.8 C)  TempSrc: Oral Oral Axillary Oral  SpO2:  96% 100% 98% 96%  Weight:  59.9 kg    Height:        Intake/Output Summary (Last 24 hours) at 06/09/2020 1207 Last data filed at 06/09/2020 1113 Gross per 24 hour  Intake 3060 ml  Output 1200 ml  Net 1860 ml   Filed Weights   06/07/20 0500 06/08/20 0548 06/09/20 0317  Weight: 60.2 kg 60.2 kg 59.9 kg    Examination:  General exam: Appears calm and comfortable  Respiratory system: Scattered rhonchi bilaterally to auscultation. Respiratory effort normal. Cardiovascular system: S1 & S2 heard, tachycardic . No JVD, murmurs, rubs, gallops or clicks. No pedal edema. Gastrointestinal system: Abdomen is nondistended, soft and nontender. No organomegaly or masses felt. Normal bowel sounds heard.  PEG tube in place Central nervous system: Alert and oriented chronic right-sided weakness from prior stroke  extremities: Symmetric 5 x 5 power. Skin: No rashes, lesions or ulcers Psychiatry: Judgement and insight appear normal. Mood & affect appropriate.     Data Reviewed: I have personally reviewed following labs and imaging studies  CBC: Recent Labs  Lab 06/03/20 0121 06/04/20 0118 06/05/20 0106  WBC 14.9* 9.9 9.2  NEUTROABS 12.3* 8.1* 7.6  HGB 9.2* 9.1* 8.4*  HCT 30.3* 30.0* 27.6*  MCV 87.1 84.7 85.2  PLT 697* 581* 409*   Basic Metabolic Panel: Recent Labs  Lab 06/03/20 0121 06/04/20 0118 06/05/20 0106 06/06/20 0017  NA 138 138 134* 134*  K 4.6 4.4 4.2 4.4  CL 103 103 102 104  CO2 22 22 22 22   GLUCOSE 123* 138* 145* 152*  BUN 19 16 13 13   CREATININE 0.83 0.86 0.79 0.66  CALCIUM 9.0 8.7* 8.5* 8.4*   GFR: Estimated Creatinine Clearance: 83.2 mL/min  (by C-G formula based on SCr of 0.66 mg/dL). Liver Function Tests: Recent Labs  Lab 06/03/20 0121 06/04/20 0118 06/05/20 0106  AST 72* 96* 89*  ALT 152* 213* 205*  ALKPHOS 275* 295* 274*  BILITOT 0.5 0.5 0.4  PROT 7.8 7.6 7.4  ALBUMIN 2.0* 2.0* 1.9*   No results for input(s): LIPASE, AMYLASE in the last 168 hours. No results for input(s): AMMONIA in the last 168 hours. Coagulation Profile: Recent Labs  Lab 06/02/20 1240 06/02/20 1524  INR 1.2 1.2   Cardiac Enzymes: No results for input(s): CKTOTAL, CKMB, CKMBINDEX, TROPONINI in the last 168 hours. BNP (last 3 results) No results for input(s): PROBNP in the last 8760 hours. HbA1C: No results for input(s): HGBA1C in the last 72 hours. CBG: Recent Labs  Lab 06/08/20 1950 06/08/20 2355 06/09/20 0315 06/09/20 0820 06/09/20 1114  GLUCAP 144* 120* 145* 135* 121*   Lipid Profile: No results for input(s): CHOL, HDL, LDLCALC, TRIG, CHOLHDL, LDLDIRECT in the last 72 hours. Thyroid Function Tests: No results for input(s): TSH, T4TOTAL, FREET4, T3FREE, THYROIDAB in the last 72 hours. Anemia Panel: No results for input(s): VITAMINB12, FOLATE, FERRITIN, TIBC, IRON, RETICCTPCT in the last 72 hours. Sepsis Labs: Recent Labs  Lab 06/02/20 1524 06/02/20 1635  LATICACIDVEN 1.5 1.6    Recent Results (from the past 240 hour(s))  Culture, blood (routine x 2)     Status: None   Collection Time: 06/01/20 10:28 AM   Specimen: BLOOD LEFT ARM  Result Value Ref Range Status   Specimen Description BLOOD LEFT ARM  Final   Special Requests BOTTLES DRAWN AEROBIC ONLY  Final   Culture   Final    NO GROWTH 5 DAYS Performed at [facility], [place]  [address] [place], [place] [address]    Report Status 06/06/2020 FINAL  Final  Culture, blood (routine x 2)     Status: None   Collection Time: 06/01/20 10:30 AM   Specimen: BLOOD LEFT HAND  Result Value Ref Range Status   Specimen Description BLOOD LEFT HAND  Final   Special Requests    Final    BOTTLES DRAWN AEROBIC ONLY Blood Culture adequate volume   Culture   Final    NO GROWTH 5 DAYS Performed at [facility], [place] [address], [place], [place] [address]    Report Status 06/06/2020 FINAL  Final  Culture, blood (x 2)     Status: None   Collection Time: 06/02/20  3:18 PM   Specimen: BLOOD LEFT HAND  Result Value Ref Range Status   Specimen Description BLOOD LEFT HAND  Final   Special Requests   Final    BOTTLES DRAWN AEROBIC AND ANAEROBIC Blood Culture adequate volume   Culture   Final    NO GROWTH 5 DAYS Performed at [facility], [place] [address], [place], [place] [address]    Report Status 06/07/2020 FINAL  Final  Culture, blood (x 2)     Status: None   Collection Time: 06/02/20  3:24 PM   Specimen: BLOOD RIGHT HAND  Result Value Ref Range Status   Specimen Description BLOOD RIGHT HAND  Final   Special Requests   Final    BOTTLES DRAWN AEROBIC AND ANAEROBIC Blood Culture adequate volume   Culture   Final    NO GROWTH 5 DAYS Performed at [facility], [place] [address], [place], [place] [address]    Report Status 06/07/2020 FINAL  Final         Radiology Studies: No results found.      Scheduled Meds: . apixaban  5 mg Oral BID  . bisacodyl  10 mg Oral Daily  . feeding supplement  1 Container Oral BID BM  . feeding supplement (PROSource TF)  45 mL Per Tube BID  . free water  150 mL Per Tube Q4H  . insulin aspart  0-24 Units Subcutaneous TID AC & HS  . mouth rinse  15 mL Mouth Rinse q12n4p  . metoprolol tartrate  50 mg Per Tube BID  . pantoprazole (PROTONIX) IV  40 mg Intravenous Q12H  . sennosides  5 mL Per Tube QHS   Continuous Infusions: . sodium chloride 1,000 mL (06/06/20 0028)  . sodium chloride    . sodium chloride    . feeding supplement (OSMOLITE 1.5 CAL) 1,000 mL (06/08/20 2249)     LOS: 29 days     Georgette Shell, MD  06/09/2020, 12:07 PM

## 2020-06-10 ENCOUNTER — Inpatient Hospital Stay (HOSPITAL_COMMUNITY): Payer: Medicaid Other

## 2020-06-10 LAB — COMPREHENSIVE METABOLIC PANEL
ALT: 157 U/L — ABNORMAL HIGH (ref 0–44)
AST: 53 U/L — ABNORMAL HIGH (ref 15–41)
Albumin: 2.1 g/dL — ABNORMAL LOW (ref 3.5–5.0)
Alkaline Phosphatase: 206 U/L — ABNORMAL HIGH (ref 38–126)
Anion gap: 8 (ref 5–15)
BUN: 12 mg/dL (ref 6–20)
CO2: 24 mmol/L (ref 22–32)
Calcium: 8.5 mg/dL — ABNORMAL LOW (ref 8.9–10.3)
Chloride: 96 mmol/L — ABNORMAL LOW (ref 98–111)
Creatinine, Ser: 0.65 mg/dL (ref 0.61–1.24)
GFR calc Af Amer: >60 mL/min (ref >60)
GFR calc non Af Amer: >60 mL/min (ref >60)
Glucose, Bld: 129 mg/dL — ABNORMAL HIGH (ref 70–99)
Potassium: 4.6 mmol/L (ref 3.5–5.1)
Sodium: 128 mmol/L — ABNORMAL LOW (ref 135–145)
Total Bilirubin: 0.4 mg/dL (ref 0.3–1.2)
Total Protein: 7.8 g/dL (ref 6.5–8.1)

## 2020-06-10 LAB — CBC
HCT: 30.3 % — ABNORMAL LOW (ref 39.0–52.0)
Hemoglobin: 9.1 g/dL — ABNORMAL LOW (ref 13.0–17.0)
MCH: 24.9 pg — ABNORMAL LOW (ref 26.0–34.0)
MCHC: 30 g/dL (ref 30.0–36.0)
MCV: 82.8 fL (ref 80.0–100.0)
Platelets: 564 10*3/uL — ABNORMAL HIGH (ref 150–400)
RBC: 3.66 MIL/uL — ABNORMAL LOW (ref 4.22–5.81)
RDW: 15 % (ref 11.5–15.5)
WBC: 10.2 10*3/uL (ref 4.0–10.5)
nRBC: 0.2 % (ref 0.0–0.2)

## 2020-06-10 LAB — GLUCOSE, CAPILLARY
Glucose-Capillary: 162 mg/dL — ABNORMAL HIGH (ref 70–99)
Glucose-Capillary: 153 mg/dL — ABNORMAL HIGH (ref 70–99)
Glucose-Capillary: 154 mg/dL — ABNORMAL HIGH (ref 70–99)
Glucose-Capillary: 122 mg/dL — ABNORMAL HIGH (ref 70–99)

## 2020-06-10 MED ORDER — PANTOPRAZOLE SODIUM 40 MG PO PACK
40.0000 mg | PACK | Freq: Two times a day (BID) | ORAL | Status: DC
  Administered 2020-06-10 – 2020-07-12 (×64): 40 mg
  Filled 2020-06-10 (×65): qty 20

## 2020-06-10 MED ORDER — TRAZODONE HCL 50 MG PO TABS
50.0000 mg | ORAL_TABLET | Freq: Every day | ORAL | Status: DC
  Administered 2020-06-10 – 2020-06-18 (×9): 50 mg via ORAL
  Filled 2020-06-10 (×9): qty 1

## 2020-06-10 NOTE — TOC Progression Note (Signed)
Transition of Care [facility]) - Progression Note    Patient Details  Name: Evan Chavez MRN: [id number] Date of Birth: 1960/12/09  [facility] CM/SW Contact  Elliot Gurney [place], [place] Phone Number: [phone] 06/10/2020, 10:26 AM  Clinical Narrative:    Phone call to patient's mother to follow up on bed selection. Patient's mother has chosen [facility]. Phone call to [facility] [phone], message left for a return call regarding [facility] availability. Patient will need a COVID test before discharge.  [address], LCSW Transition of Care [phone]     Expected Discharge Plan: [place] Barriers to Discharge: Inadequate or no insurance, Ship broker, Continued Medical Work up  [employer] and [employer] Expected Discharge Plan: [place] arrangements for the past 2 months: Single Family Home                                       Social Determinants of Health (SDOH) Interventions    Readmission Risk Interventions Readmission Risk Prevention Plan 06/09/2020  Transportation Screening Complete  PCP or Specialist Appt within 3-5 Days Complete  HRI or [place] Patient refused  Social Work Consult for [place] Planning/Counseling Complete  Palliative Care Screening Not Applicable  Medication Review Press photographer) Referral to Pharmacy  Some recent data might be hidden

## 2020-06-10 NOTE — Progress Notes (Addendum)
PROGRESS NOTE    Jamontae Thwaites [place]a  ZGY:[id number] DOB: 11-05-1960 DOA: 05/10/2020 PCP: Rosita Fire, MD    Brief Narrative: Anuar Walgren is a 59 y.o. year old male with medical history significant for GERD, Barrett's esophagus, peptic ulcer disease with extensive gastric erosion, duodenal ulcerand esophageal stricture status post balloon dilatation on 12/2018, also with history of prior stroke, hyperlipidemia, BPH, remote cocaine abuse and prior CVA who presented on 05/10/2020 to [facility] with generalized weakness, productive cough and low-grade fever shortness of breath initially admitted for pneumonia based off CTA chest showing bibasilar consolidation pleural effusions treated with ceftriaxone and azithromycin. Hospital course complicated by right-sided hydropneumothorax with possible lung collapse after right thoracentesis on 8/2, esophageal perforation (CT chest on 8/4)With large right-sided hydropneumothorax, complicated by aspiration pneumonia.  Patient underwent right-sided chest tube placement was transferred from [facility] to [facility] where he underwent VATS and decortication by CT surgery as well as esophageal stent placement and PEG tube placement on 8/4-8/5.  Patient has been managed on hospitalist service since.  Did have episodes of persistent leakage from esophagus on esophagram as well as requiring right thorascopic for drainage of loculated pleural effusion on 8/13 by CT surgery,  repeat esophagram on 8/16 did not show any esophageal leak. Currently hospitalist service is following as a consult and CT surgery is primary team.  Patient was restarted on broad spectrum antibiotics on 8/21 due to sepsis secondary to RLL pna evident on CXR and CT chest. His known pleural effusion showed slight decrease in size with appropriate position of chest tube on last CT chest on 8/21. Given his persistent tachycardia and tachypnea repeat CXR obtained on 8/23 again noted stable RLL Pna  and atlectasis.  Currently hospital course complicated by odynophagia/dysphagia and pocketing of oral secretions and poor adherence to pulmonary toilet. Speech is evaluating and assisting with planned FEES.    Assessment & Plan:   Active Problems:   Essential hypertension   Atrial tachycardia (HCC)   Dyslipidemia   CAP (community acquired pneumonia)   Transaminitis   Lobar pneumonia ([place])   Sepsis due to undetermined organism ([place])   Fever   Gastroesophageal reflux disease with esophagitis   Shortness of breath   Thyroid nodule   BPH (benign prostatic hyperplasia)   Loculated pleural effusion   Ureteral stone with hydronephrosis   Pneumothorax on right   Acute respiratory failure with hypoxia (HCC)   Chronic bilateral pleural effusions   Parapneumonic effusion   Malnutrition of moderate degree   Esophageal perforation   Pressure injury of skin   Atrial fibrillation with RVR (HCC)   Esophageal anastomotic leak   Atelectasis   Right lower lobe pulmonary infiltrate   Sinus tachycardia   Dysphagia   Respiratory distress   Status post thoracentesis   #1 acute hypoxic respiratory failure secondary to pneumonia/parapneumonic effusion/right apical pneumothorax status post chest tube placement and chest tube removed 06/01/2020 with no pneumothorax on repeat chest x-ray.  Patient completed a course of fluconazole for Candida para silicosis.  #2 sepsis present on admission due to esophageal rupture aspiration pneumonia and mediastinitis.  CT chest at the time of admission shows right lower lobe effusion and empyema.  Patient received a course of cefepime the last dose was on 06/08/2020.  MRSA PCR was negative.  T-max 99.7.  He remains tachycardic and occasionally tachypneic.  Will check CBC CMP and blood cultures today.  #3 esophageal rupture status post chest tube which was removed 06/01/2020.  #  4 oropharyngeal dysphagia continue tube feeds  #5 acute on chronic blood loss anemia  stable.  This is thought to be secondary to blood loss from recent esophageal rupture and surgery as well as hematemesis.    #6 severe deconditioning seen by PT recommends SNF.  Case manager aware.  #7 history of paroxysmal atrial fibrillation currently in sinus rhythm.  Was seen by cardiology recommended to continue metoprolol 50 twice a day and to discontinue diltiazem.  On Eliquis 5 mg twice daily.  #8 chronic sinus tachycardia CT chest 729 no PE.  Continue beta-blocker.  #9 history of prior stroke-aspirin on hold  Restart Lipitor prior to discharge, Lipitor on hold due to elevated LFTs.  #10 abnormal LFTs trending down continue to hold statins  #11 left hydronephrosis seen by urology recommended to continue original plan of shockwave lithotripsy as an outpatient.  This was not done during this admission due to patient's worsening respiratory status.  Urology will follow up with the patient as an outpatient.  #12 history of stroke with right-sided weakness residual.  #13 pressure injury stage II to the right buttocks see below       Nutrition Problem: Moderate Malnutrition Etiology: chronic illness (Barrett's esophagus, GERD)     Signs/Symptoms: moderate fat depletion, moderate muscle depletion    Interventions: Tube feeding  Estimated body mass index is 19.5 kg/m as calculated from the following:   Height as of this encounter: 5\' 9"  (1.753 m).   Weight as of this encounter: 59.9 kg.  DVT prophylaxis: scd/consult pharmacy for Eliquis for A. fib Code Status:full Family Communication: none at bedside Disposition patient is medically stable to be discharged however he needs to go to SNF and has insurance issues as a barrier for discharge.  Case manager aware.  Procedures: chest tube Antimicrobials:  Anti-infectives (From admission, onward)   Start     Dose/Rate Route Frequency Ordered Stop   06/03/20 0200  vancomycin (VANCOCIN) IVPB 1000 mg/200 mL premix  Status:   Discontinued        1,000 mg 200 mL/hr over 60 Minutes Intravenous Every 12 hours 06/02/20 1248 06/05/20 0915   06/02/20 2200  ceFEPIme (MAXIPIME) 2 g in sodium chloride 0.9 % 100 mL IVPB        2 g 200 mL/hr over 30 Minutes Intravenous Every 8 hours 06/02/20 1251 06/08/20 2359   06/02/20 2000  piperacillin-tazobactam (ZOSYN) IVPB 3.375 g  Status:  Discontinued        3.375 g 12.5 mL/hr over 240 Minutes Intravenous Every 8 hours 06/02/20 1248 06/02/20 1249   06/02/20 1330  vancomycin (VANCOREADY) IVPB 1250 mg/250 mL        1,250 mg 166.7 mL/hr over 90 Minutes Intravenous  Once 06/02/20 1248 06/02/20 1646   06/02/20 1330  piperacillin-tazobactam (ZOSYN) IVPB 3.375 g  Status:  Discontinued        3.375 g 100 mL/hr over 30 Minutes Intravenous  Once 06/02/20 1248 06/02/20 1249   06/02/20 1215  vancomycin (VANCOCIN) IVPB 1000 mg/200 mL premix  Status:  Discontinued        1,000 mg 200 mL/hr over 60 Minutes Intravenous  Once 06/02/20 1202 06/02/20 1251   06/02/20 1215  ceFEPIme (MAXIPIME) 2 g in sodium chloride 0.9 % 100 mL IVPB        2 g 200 mL/hr over 30 Minutes Intravenous  Once 06/02/20 1202 06/02/20 1506   05/29/20 1800  vancomycin (VANCOCIN) IVPB 1000 mg/200 mL premix  Status:  Discontinued  1,000 mg 200 mL/hr over 60 Minutes Intravenous Every 12 hours 05/29/20 1151 05/30/20 0912   05/24/20 2100  vancomycin (VANCOREADY) IVPB 750 mg/150 mL  Status:  Discontinued        750 mg 150 mL/hr over 60 Minutes Intravenous Every 12 hours 05/24/20 0813 05/29/20 1151   05/24/20 0900  vancomycin (VANCOREADY) IVPB 1500 mg/300 mL        1,500 mg 150 mL/hr over 120 Minutes Intravenous  Once 05/24/20 0813 05/24/20 1221   05/17/20 0900  fluconazole (DIFLUCAN) IVPB 400 mg  Status:  Discontinued        400 mg 100 mL/hr over 120 Minutes Intravenous Every 24 hours 05/17/20 0749 06/05/20 0915   05/17/20 0600  vancomycin (VANCOREADY) IVPB 750 mg/150 mL  Status:  Discontinued        750 mg 150 mL/hr  over 60 Minutes Intravenous Every 8 hours 05/16/20 2154 05/17/20 0903   05/17/20 0400  vancomycin (VANCOREADY) IVPB 750 mg/150 mL  Status:  Discontinued        750 mg 150 mL/hr over 60 Minutes Intravenous Every 8 hours 05/16/20 1945 05/16/20 2154   05/16/20 2200  vancomycin (VANCOREADY) IVPB 1250 mg/250 mL        1,250 mg 166.7 mL/hr over 90 Minutes Intravenous STAT 05/16/20 2153 05/16/20 2348   05/16/20 2000  vancomycin (VANCOREADY) IVPB 1250 mg/250 mL  Status:  Discontinued        1,250 mg 166.7 mL/hr over 90 Minutes Intravenous  Once 05/16/20 1945 05/16/20 2153   05/15/20 1600  piperacillin-tazobactam (ZOSYN) IVPB 3.375 g  Status:  Discontinued        3.375 g 12.5 mL/hr over 240 Minutes Intravenous Every 8 hours 05/15/20 0945 05/30/20 0912   05/15/20 0930  piperacillin-tazobactam (ZOSYN) IVPB 3.375 g        3.375 g 100 mL/hr over 30 Minutes Intravenous  Once 05/15/20 0921 05/15/20 1009   05/13/20 2000  azithromycin (ZITHROMAX) tablet 500 mg        500 mg Oral Daily 05/13/20 1155 05/14/20 2127   05/10/20 2200  cefTRIAXone (ROCEPHIN) 2 g in sodium chloride 0.9 % 100 mL IVPB  Status:  Discontinued        2 g 200 mL/hr over 30 Minutes Intravenous Every 24 hours 05/10/20 2148 05/15/20 0911   05/10/20 2200  azithromycin (ZITHROMAX) 500 mg in sodium chloride 0.9 % 250 mL IVPB  Status:  Discontinued        500 mg 250 mL/hr over 60 Minutes Intravenous Every 24 hours 05/10/20 2148 05/13/20 1155      Subjective: He is resting in bed.  Staff reported he did not sleep well last night.  Requesting something for sleep and requesting for chest PT scheduled as patient not able to clear secretions. Objective: Vitals:   06/10/20 0800 06/10/20 1000 06/10/20 1100 06/10/20 1200  BP: 131/87 117/71 117/71 (!) 129/93  Pulse: 99   (!) 101  Resp: (!) 34 (!) 30 (!) 33 (!) 25  Temp:   98.1 F (36.7 C) 98.1 F (36.7 C)  TempSrc:   Oral Oral  SpO2: 95%   93%  Weight:      Height:        Intake/Output  Summary (Last 24 hours) at 06/10/2020 1243 Last data filed at 06/10/2020 0600 Gross per 24 hour  Intake 1620 ml  Output 600 ml  Net 1020 ml   Filed Weights   06/07/20 0500 06/08/20 0548 06/09/20 0317  Weight: 60.2  kg 60.2 kg 59.9 kg    Examination:  General exam: Appears calm and comfortable  Respiratory system: Scattered rhonchi bilaterally to auscultation. Respiratory effort normal. Cardiovascular system: S1 & S2 heard, tachycardic . No JVD, murmurs, rubs, gallops or clicks. No pedal edema. Gastrointestinal system: Abdomen is nondistended, soft and nontender. No organomegaly or masses felt. Normal bowel sounds heard.  PEG tube in place Central nervous system: Alert and oriented chronic right-sided weakness from prior stroke  extremities: Symmetric 5 x 5 power. Skin: No rashes, lesions or ulcers Psychiatry: Judgement and insight appear normal. Mood & affect appropriate.     Data Reviewed: I have personally reviewed following labs and imaging studies  CBC: Recent Labs  Lab 06/04/20 0118 06/05/20 0106  WBC 9.9 9.2  NEUTROABS 8.1* 7.6  HGB 9.1* 8.4*  HCT 30.0* 27.6*  MCV 84.7 85.2  PLT 581* 606*   Basic Metabolic Panel: Recent Labs  Lab 06/04/20 0118 06/05/20 0106 06/06/20 0017  NA 138 134* 134*  K 4.4 4.2 4.4  CL 103 102 104  CO2 22 22 22   GLUCOSE 138* 145* 152*  BUN 16 13 13   CREATININE 0.86 0.79 0.66  CALCIUM 8.7* 8.5* 8.4*   GFR: Estimated Creatinine Clearance: 83.2 mL/min (by C-G formula based on SCr of 0.66 mg/dL). Liver Function Tests: Recent Labs  Lab 06/04/20 0118 06/05/20 0106  AST 96* 89*  ALT 213* 205*  ALKPHOS 295* 274*  BILITOT 0.5 0.4  PROT 7.6 7.4  ALBUMIN 2.0* 1.9*   No results for input(s): LIPASE, AMYLASE in the last 168 hours. No results for input(s): AMMONIA in the last 168 hours. Coagulation Profile: No results for input(s): INR, PROTIME in the last 168 hours. Cardiac Enzymes: No results for input(s): CKTOTAL, CKMB, CKMBINDEX,  TROPONINI in the last 168 hours. BNP (last 3 results) No results for input(s): PROBNP in the last 8760 hours. HbA1C: No results for input(s): HGBA1C in the last 72 hours. CBG: Recent Labs  Lab 06/09/20 1114 06/09/20 1613 06/09/20 2119 06/10/20 0641 06/10/20 1103  GLUCAP 121* 140* 151* 162* 153*   Lipid Profile: No results for input(s): CHOL, HDL, LDLCALC, TRIG, CHOLHDL, LDLDIRECT in the last 72 hours. Thyroid Function Tests: No results for input(s): TSH, T4TOTAL, FREET4, T3FREE, THYROIDAB in the last 72 hours. Anemia Panel: No results for input(s): VITAMINB12, FOLATE, FERRITIN, TIBC, IRON, RETICCTPCT in the last 72 hours. Sepsis Labs: No results for input(s): PROCALCITON, LATICACIDVEN in the last 168 hours.  Recent Results (from the past 240 hour(s))  Culture, blood (routine x 2)     Status: None   Collection Time: 06/01/20 10:28 AM   Specimen: BLOOD LEFT ARM  Result Value Ref Range Status   Specimen Description BLOOD LEFT ARM  Final   Special Requests BOTTLES DRAWN AEROBIC ONLY  Final   Culture   Final    NO GROWTH 5 DAYS Performed at [facility], [address] [place], [place] [address]    Report Status 06/06/2020 FINAL  Final  Culture, blood (routine x 2)     Status: None   Collection Time: 06/01/20 10:30 AM   Specimen: BLOOD LEFT HAND  Result Value Ref Range Status   Specimen Description BLOOD LEFT HAND  Final   Special Requests   Final    BOTTLES DRAWN AEROBIC ONLY Blood Culture adequate volume   Culture   Final    NO GROWTH 5 DAYS Performed at [facility], [place] [address] [place], [place] [address]  Report Status 06/06/2020 FINAL  Final  Culture, blood (x 2)     Status: None   Collection Time: 06/02/20  3:18 PM   Specimen: BLOOD LEFT HAND  Result Value Ref Range Status   Specimen Description BLOOD LEFT HAND  Final   Special Requests   Final    BOTTLES DRAWN AEROBIC AND ANAEROBIC Blood Culture adequate volume   Culture   Final    NO  GROWTH 5 DAYS Performed at [facility], [place] [address] [place], [place] [address]    Report Status 06/07/2020 FINAL  Final  Culture, blood (x 2)     Status: None   Collection Time: 06/02/20  3:24 PM   Specimen: BLOOD RIGHT HAND  Result Value Ref Range Status   Specimen Description BLOOD RIGHT HAND  Final   Special Requests   Final    BOTTLES DRAWN AEROBIC AND ANAEROBIC Blood Culture adequate volume   Culture   Final    NO GROWTH 5 DAYS Performed at [facility], [place] [address], [place], [place] [address]    Report Status 06/07/2020 FINAL  Final         Radiology Studies: No results found.      Scheduled Meds: . apixaban  5 mg Oral BID  . bisacodyl  10 mg Oral Daily  . feeding supplement  1 Container Oral BID BM  . feeding supplement (PROSource TF)  45 mL Per Tube BID  . free water  150 mL Per Tube Q4H  . insulin aspart  0-24 Units Subcutaneous TID AC & HS  . mouth rinse  15 mL Mouth Rinse q12n4p  . metoprolol tartrate  50 mg Per Tube BID  . pantoprazole (PROTONIX) IV  40 mg Intravenous Q12H  . sennosides  5 mL Per Tube QHS  . traZODone  50 mg Oral QHS   Continuous Infusions: . sodium chloride 1,000 mL (06/06/20 0028)  . sodium chloride    . feeding supplement (OSMOLITE 1.5 CAL) 1,000 mL (06/08/20 2249)     LOS: 30 days     Georgette Shell, MD  06/10/2020, 12:43 PM

## 2020-06-10 NOTE — TOC Progression Note (Addendum)
Transition of Care [facility]) - Progression Note    Patient Details  Name: Evan Chavez MRN: [id number] Date of Birth: 05-07-1961  Transition of Care [facility]) CM/SW Contact  Elliot Gurney [place], [place] Phone Number: [phone] 06/10/2020, 10:55 AM  Clinical Narrative:    Return phone call from [facility] [phone]. Patient has [place] Medicaid [employer] and will need authorization before he is admitted. Patient will  need a COVID test and be afebrile for 24 hours before he is admitted.  Deitra Craine, LCSW Transitions of Care [phone]   Expected Discharge Plan: Skilled Nursing Facility Barriers to Discharge: Inadequate or no insurance, Ship broker, Continued Medical Work up  Expected Discharge Plan and Services Expected Discharge Plan: [place] arrangements for the past 2 months: Single Family Home                                       Social Determinants of Health (SDOH) Interventions    Readmission Risk Interventions Readmission Risk Prevention Plan 06/09/2020  Transportation Screening Complete  PCP or Specialist Appt within 3-5 Days Complete  HRI or [place] Patient refused  Social Work Consult for [place] Planning/Counseling Complete  Palliative Care Screening Not Applicable  Medication Review [employer]) Referral to Pharmacy  Some recent data might be hidden

## 2020-06-10 NOTE — Plan of Care (Signed)

## 2020-06-10 NOTE — Progress Notes (Signed)
   06/10/20 0800  Assess: MEWS Score  BP 131/87  Pulse Rate 99  ECG Heart Rate (!) 117  Resp (!) 34  Level of Consciousness Alert  SpO2 95 %  O2 Device Room Air  Assess: MEWS Score  MEWS Temp 0  MEWS Systolic 0  MEWS Pulse 2  MEWS RR 2  MEWS LOC 0  MEWS Score 4  MEWS Score Color Red  Assess: if the MEWS score is Yellow or Red  Were vital signs taken at a resting state? Yes  Focused Assessment No change from prior assessment  Early Detection of Sepsis Score *See Row Information* Medium  MEWS guidelines implemented *See Row Information* No, previously red, continue vital signs every 4 hours  Treat  Pain Scale 0-10  Pain Score 0  Diarrhea relieved by Nothing  Escalate  MEWS: Escalate Red: discuss with charge nurse/RN and provider, consider discussing with RRT  Notify: Charge Nurse/RN  Name of Charge Nurse/RN Notified [place]  Date Charge Nurse/RN Notified 06/10/20  Time Charge Nurse/RN Notified 0800  Notify: Provider  Provider Name/Title dr Rodena Piety  Date Provider Notified 06/10/20  Time Provider Notified 0800  Notification Type Rounds  Notification Reason  (no change )  Response No new orders  Document  Progress note created (see row info) Yes  Mews been red and yellow, no new orders, continue monitoring

## 2020-06-10 NOTE — Progress Notes (Signed)
Rx will continue to monitor apixaban peripherally.  Onnie Boer, PharmD, BCIDP, AAHIVP, CPP Infectious Disease Pharmacist 06/10/2020 1:41 PM

## 2020-06-11 LAB — GLUCOSE, CAPILLARY
Glucose-Capillary: 146 mg/dL — ABNORMAL HIGH (ref 70–99)
Glucose-Capillary: 120 mg/dL — ABNORMAL HIGH (ref 70–99)
Glucose-Capillary: 115 mg/dL — ABNORMAL HIGH (ref 70–99)
Glucose-Capillary: 151 mg/dL — ABNORMAL HIGH (ref 70–99)

## 2020-06-11 LAB — SARS CORONAVIRUS 2 BY RT PCR (HOSPITAL ORDER, PERFORMED IN ~~LOC~~ HOSPITAL LAB): SARS Coronavirus 2: NEGATIVE

## 2020-06-11 NOTE — Progress Notes (Signed)
8/30: CSW tried to speak with mother of pt but she was not available. CSW spoke with Cecille Rubin at [facility], there are currently no beds available. She will update CSW if availability changes.

## 2020-06-11 NOTE — Progress Notes (Signed)
Transitions of Care Pharmacist Note  Evan Chavez is a 59 y.o. male that has been diagnosed with A Fib and will be prescribed Eliquis (apixaban) at discharge.   Patient Education: I provided the following education on Eliquis to the patient: How to take the medication Described what the medication is Signs of bleeding Signs/symptoms of VTE and stroke  Answered their questions  Discharge Medications Plan: The patient is not interested in filling their discharge medications with the Transitions of Care pharmacy at this time.   If the patient later decides they would like to have the Transitions of Care pharmacy fill their discharge medications, please call us at [phone]. Thank you.    Thank you,   Claudina Lick, PharmD PGY1 Acute Care Pharmacy Resident 06/11/2020 6:50 PM  Please check [email or web address] for unit-specific pharmacy phone numbers.

## 2020-06-11 NOTE — Progress Notes (Signed)
Occupational Therapy Treatment Patient Details Name: Evan Chavez MRN: [id number] DOB: 12/15/1960 Today's Date: 06/11/2020    History of present illness Pt is 59 yo male presents to the hospital on 7/28 with generalized weakness, productive cough and low-grade fever. Pt is currently being treated for hydropneumothorax, Sepsis aspiration pneumonia and esophagal rupture, AKI and acute delirium. s/p R thoracentesis on 8/2. Chest tube placed 8/2-8/9. s/p VATS, decortication, EGD, PEG tube placement, and a esophageal stent on 8/9. PMH of GERD, Barrett's esophagus, PUD with extensive gastric erosion, duodenal ulcer and esophageal stricture, stroke, HLD, BPH, cocaine abuse. s/p Right thoracoscopy, chest tube placement, esophageal stent placement 8/13-8/20.   OT comments  Pt limited by HR (126) today, but willing to work with therapy and making slow progress towards OT goals.  Pt assisted for clean up of incontinence, and returned supine. Pt min A to min guard for line safety and management, use of RW during standing. Pt not verbalizing much during session, following basic commands. Pt continues to benefit from skilled OT acutely and continues to require post-acute OT at the SNF level.    Follow Up Recommendations  SNF    Equipment Recommendations  Other (comment) (defer to next venue of care)    Recommendations for Other Services      Precautions / Restrictions Precautions Precautions: Fall Precaution Comments: PEG Restrictions Weight Bearing Restrictions: No       Mobility Bed Mobility Overal bed mobility: Needs Assistance Bed Mobility: Sit to Supine       Sit to supine: Min guard   General bed mobility comments: Assist for safety and lines  Transfers Overall transfer level: Needs assistance Equipment used: Rolling walker (2 wheeled) Transfers: Sit to/from Stand Sit to Stand: Min assist         General transfer comment: Assist to bring hips up and verbal cues for hand  placement    Balance Overall balance assessment: Needs assistance Sitting-balance support: Feet supported;No upper extremity supported Sitting balance-Leahy Scale: Fair     Standing balance support: During functional activity Standing balance-Leahy Scale: Poor Standing balance comment: walker and min assist for static standing                           ADL either performed or assessed with clinical judgement   ADL Overall ADL's : Needs assistance/impaired     Grooming: Min guard;Sitting;Wash/dry face Grooming Details (indicate cue type and reason): seated EOB             Lower Body Dressing: Moderate assistance;Sitting/lateral leans Lower Body Dressing Details (indicate cue type and reason): doff socks EOB Toilet Transfer: Minimal assistance;Stand-pivot Toilet Transfer Details (indicate cue type and reason): from recliner>bed Toileting- Clothing Manipulation and Hygiene: Total assistance;Sit to/from stand Toileting - Clothing Manipulation Details (indicate cue type and reason): for posterior pericare              Vision       Perception     Praxis      Cognition Arousal/Alertness: Awake/alert Behavior During Therapy: Flat affect Overall Cognitive Status: Impaired/Different from baseline Area of Impairment: Following commands;Problem solving                       Following Commands: Follows one step commands consistently;Follows one step commands with increased time     Problem Solving: Slow processing;Decreased initiation;Requires verbal cues General Comments: Pt with very little verbalization. Requires repetition.  Exercises     Shoulder Instructions       General Comments limted by elevated HR and RR, Pt incontinent    Pertinent Vitals/ Pain       Pain Assessment: Faces Faces Pain Scale: No hurt Pain Intervention(s): Monitored during session  Home Living                                           Prior Functioning/Environment              Frequency  Min 2X/week        Progress Toward Goals  OT Goals(current goals can now be found in the care plan section)  Progress towards OT goals: Progressing toward goals  Acute Rehab OT Goals Patient Stated Goal: get better OT Goal Formulation: With patient Time For Goal Achievement: 06/20/20 Potential to Achieve Goals: Good  Plan Discharge plan remains appropriate    Co-evaluation                 AM-PAC OT "6 Clicks" Daily Activity     Outcome Measure   Help from another person eating meals?: A Lot Help from another person taking care of personal grooming?: A Little Help from another person toileting, which includes using toliet, bedpan, or urinal?: A Lot Help from another person bathing (including washing, rinsing, drying)?: A Lot Help from another person to put on and taking off regular upper body clothing?: A Little Help from another person to put on and taking off regular lower body clothing?: A Lot 6 Click Score: 14    End of Session Equipment Utilized During Treatment: Rolling walker  OT Visit Diagnosis: Unsteadiness on feet (R26.81);Other abnormalities of gait and mobility (R26.89);Muscle weakness (generalized) (M62.81);Other symptoms and signs involving cognitive function;Feeding difficulties (R63.3);Pain   Activity Tolerance Patient tolerated treatment well   Patient Left in bed;with call bell/phone within reach;with bed alarm set   Nurse Communication Mobility status        Time: 5329-9242 OT Time Calculation (min): 10 min  Charges: OT General Charges $OT Visit: 1 Visit OT Treatments $Self Care/Home Management : 8-22 mins  Jesse Sans OTR/L Acute Rehabilitation Services Pager: [phone] Office: [place] 06/11/2020, 1:10 PM

## 2020-06-11 NOTE — Progress Notes (Signed)
  Speech Language Pathology Treatment: Dysphagia  Patient Details Name: Evan Chavez MRN: [id number] DOB: [birth year] Today's Date: 06/11/2020 Time: 0865-7846 SLP Time Calculation (min) (ACUTE ONLY): 10 min  Assessment / Plan / Recommendation Clinical Impression  Pt was seen for dysphagia treatment. He was alert throughout the session, but required encouragement to participate and participation was still limited. Marita [place], RN indicated that the pt still needs to be suctioned intermittently to clear saliva and that p.o. intake is limited. Pt accepted limited trials of lemon ice boluses with mild reduction in oral holding. Cueing was provided twice for swallowing, but oral suctioning was ultimately still needed. Oral holding appeared to be reduced slightly with cold boluses and additional tactile stimulation applied by additional pressure from spoon. Pt refused to participate further in the session. SLP will continue to follow pt; however, a discharge from SLP services will be imminent if his level of participation does not improve.    HPI HPI: Evan Chavez is a 59 y.o. year old male initally admitted with generalized weakness, productive cough and low-grade fever shortness of breath initially admitted for pneumonia based off CTA chest showing bibasilar consolidation pleural effusions treated with ceftriaxone and azithromycin. Pt had an MBS on 05/14/20 (prior to any notes indicating esopahgeal perforation) that showed mild oropharyngeal dysphagia with only premature spillage, mild oral residue. Pt recommended to consume regualr/thin.  Study reviewed by Dr. Elsworth Soho. On that same day, Pt underwent a Ultrasound-guided thoracentesis to define parapneumonic effusion, suspected to be from aspiration pna due to esophageal stricture. Found to have RT apical pneumothorax. Pt was then transferred to [facility] for a VATS decortication (not done?) and Chest tube placed. Post procedure CT chest performed which showed  esophageal perforation. [facility] then reported "He has a long history of peptic strictures that have been dilated on multiple occasions.  It appears as though this may be the culprit for his perforation."  Underwent esophageal stent placement and PEG tube placement on 8/4-8/5.  Did have episodes of persistent leakage from esophagus on esophagram as well as requiring right thorascopic for drainage of loculated pleural effusion on 8/13 by CT surgery,  repeat esophagram on 8/16 did not show any esophageal leak. Pt has not been accepting oral PO, or has been orally holding his secretions. Has also been treated for candida. PMH: GERD, Barrett's esophagus, peptic ulcer disease with extensive gastric erosion, duodenal ulcerand esophageal stricture status post balloon dilatation on 12/2018, also with history of prior stroke, hyperlipidemia, BPH, remote cocaine abuse and prior CVA.      SLP Plan  Continue with current plan of care       Recommendations  Diet recommendations: Thin liquid Medication Administration: Whole meds with liquid Compensations: Slow rate;Small sips/bites Postural Changes and/or Swallow Maneuvers: Seated upright 90 degrees                Oral Care Recommendations: Oral care QID Follow up Recommendations: Skilled Nursing facility SLP Visit Diagnosis: Dysphagia, unspecified (R13.10) Plan: Continue with current plan of care       Miray Mancino I. Hardin Negus, [place], [place] Office number [phone] Pager [phone]                Horton Marshall 06/11/2020, 5:09 PM

## 2020-06-11 NOTE — Plan of Care (Signed)

## 2020-06-11 NOTE — Progress Notes (Signed)
PROGRESS NOTE    Evan Chavez  YOV:[id number] DOB: 1960/12/18 DOA: 05/10/2020 PCP: Rosita Fire, MD    Brief Narrative: Evan Chavez is a 59 y.o. year old male with medical history significant for GERD, Barrett's esophagus, peptic ulcer disease with extensive gastric erosion, duodenal ulcerand esophageal stricture status post balloon dilatation on 12/2018, also with history of prior stroke, hyperlipidemia, BPH, remote cocaine abuse and prior CVA who presented on 05/10/2020 to [facility] with generalized weakness, productive cough and low-grade fever shortness of breath initially admitted for pneumonia based off CTA chest showing bibasilar consolidation pleural effusions treated with ceftriaxone and azithromycin. Hospital course complicated by right-sided hydropneumothorax with possible lung collapse after right thoracentesis on 8/2, esophageal perforation (CT chest on 8/4)With large right-sided hydropneumothorax, complicated by aspiration pneumonia.  Patient underwent right-sided chest tube placement was transferred from [facility] to [facility] where he underwent VATS and decortication by CT surgery as well as esophageal stent placement and PEG tube placement on 8/4-8/5.  Patient has been managed on hospitalist service since.  Did have episodes of persistent leakage from esophagus on esophagram as well as requiring right thorascopic for drainage of loculated pleural effusion on 8/13 by CT surgery,  repeat esophagram on 8/16 did not show any esophageal leak. Currently hospitalist service is following as a consult and CT surgery is primary team.  Patient was restarted on broad spectrum antibiotics on 8/21 due to sepsis secondary to RLL pna evident on CXR and CT chest. His known pleural effusion showed slight decrease in size with appropriate position of chest tube on last CT chest on 8/21. Given his persistent tachycardia and tachypnea repeat CXR obtained on 8/23 again noted stable RLL Pna  and atlectasis.  Currently hospital course complicated by odynophagia/dysphagia and pocketing of oral secretions and poor adherence to pulmonary toilet. Speech is evaluating and assisting with planned FEES.    Assessment & Plan:   Active Problems:   Essential hypertension   Atrial tachycardia (HCC)   Dyslipidemia   CAP (community acquired pneumonia)   Transaminitis   Lobar pneumonia ([place])   Sepsis due to undetermined organism ([place])   Fever   Gastroesophageal reflux disease with esophagitis   Shortness of breath   Thyroid nodule   BPH (benign prostatic hyperplasia)   Loculated pleural effusion   Ureteral stone with hydronephrosis   Pneumothorax on right   Acute respiratory failure with hypoxia (HCC)   Chronic bilateral pleural effusions   Parapneumonic effusion   Malnutrition of moderate degree   Esophageal perforation   Pressure injury of skin   Atrial fibrillation with RVR (HCC)   Esophageal anastomotic leak   Atelectasis   Right lower lobe pulmonary infiltrate   Sinus tachycardia   Dysphagia   Respiratory distress   Status post thoracentesis   #1 acute hypoxic respiratory failure secondary to pneumonia/parapneumonic effusion/right apical pneumothorax status post chest tube placement and chest tube removed 06/01/2020 with no pneumothorax on repeat chest x-ray.  Patient completed a course of fluconazole for Candida para silicosis. Chest x-ray 06/11/2020 small effusion otherwise patent esophageal stent.  #2 sepsis present on admission due to esophageal rupture aspiration pneumonia and mediastinitis.  CT chest at the time of admission shows right lower lobe effusion and empyema.  Patient received a course of cefepime the last dose was on 06/08/2020.  MRSA PCR was negative.  T-max 98.7.  He is chronically tachycardic but asymptomatic.   #3 esophageal rupture status post chest tube which was removed 06/01/2020.  #  4 oropharyngeal dysphagia continue tube feeds  #5 acute on  chronic blood loss anemia stable.  This is thought to be secondary to blood loss from recent esophageal rupture and surgery as well as hematemesis.    #6 severe deconditioning seen by PT recommends SNF.  Case manager aware.covid snf ordered 8/30  #7 history of paroxysmal atrial fibrillation currently in sinus rhythm.  Was seen by cardiology recommended to continue metoprolol 50 twice a day and to discontinue diltiazem.  On Eliquis 5 mg twice daily.  #8 chronic sinus tachycardia CT chest 729 no PE.  Continue beta-blocker.  #9 history of prior stroke-aspirin on hold  Restart Lipitor prior to discharge, Lipitor on hold due to elevated LFTs.  #10 abnormal LFTs trending down continue to hold statins  #11 left hydronephrosis seen by urology recommended to continue original plan of shockwave lithotripsy as an outpatient.  This was not done during this admission due to patient's worsening respiratory status.  Urology will follow up with the patient as an outpatient.  #12 history of stroke with right-sided weakness residual.  #13 pressure injury stage II to the right buttocks see below  #14 hyponatremia DC free water given normal saline instead     Nutrition Problem: Moderate Malnutrition Etiology: chronic illness (Barrett's esophagus, GERD)     Signs/Symptoms: moderate fat depletion, moderate muscle depletion    Interventions: Tube feeding  Estimated body mass index is 19.27 kg/m as calculated from the following:   Height as of this encounter: 5\' 9"  (1.753 m).   Weight as of this encounter: 59.2 kg.  DVT prophylaxis: scd/consult pharmacy for Eliquis for A. fib Code Status:full Family Communication: none at bedside Disposition patient is medically stable to be discharged however he needs to go to SNF and has insurance issues as a barrier for discharge.  Case manager aware.  Procedures: chest tube Antimicrobials:  Anti-infectives (From admission, onward)   Start     Dose/Rate  Route Frequency Ordered Stop   06/03/20 0200  vancomycin (VANCOCIN) IVPB 1000 mg/200 mL premix  Status:  Discontinued        1,000 mg 200 mL/hr over 60 Minutes Intravenous Every 12 hours 06/02/20 1248 06/05/20 0915   06/02/20 2200  ceFEPIme (MAXIPIME) 2 g in sodium chloride 0.9 % 100 mL IVPB        2 g 200 mL/hr over 30 Minutes Intravenous Every 8 hours 06/02/20 1251 06/08/20 2359   06/02/20 2000  piperacillin-tazobactam (ZOSYN) IVPB 3.375 g  Status:  Discontinued        3.375 g 12.5 mL/hr over 240 Minutes Intravenous Every 8 hours 06/02/20 1248 06/02/20 1249   06/02/20 1330  vancomycin (VANCOREADY) IVPB 1250 mg/250 mL        1,250 mg 166.7 mL/hr over 90 Minutes Intravenous  Once 06/02/20 1248 06/02/20 1646   06/02/20 1330  piperacillin-tazobactam (ZOSYN) IVPB 3.375 g  Status:  Discontinued        3.375 g 100 mL/hr over 30 Minutes Intravenous  Once 06/02/20 1248 06/02/20 1249   06/02/20 1215  vancomycin (VANCOCIN) IVPB 1000 mg/200 mL premix  Status:  Discontinued        1,000 mg 200 mL/hr over 60 Minutes Intravenous  Once 06/02/20 1202 06/02/20 1251   06/02/20 1215  ceFEPIme (MAXIPIME) 2 g in sodium chloride 0.9 % 100 mL IVPB        2 g 200 mL/hr over 30 Minutes Intravenous  Once 06/02/20 1202 06/02/20 1506   05/29/20 1800  vancomycin (  VANCOCIN) IVPB 1000 mg/200 mL premix  Status:  Discontinued        1,000 mg 200 mL/hr over 60 Minutes Intravenous Every 12 hours 05/29/20 1151 05/30/20 0912   05/24/20 2100  vancomycin (VANCOREADY) IVPB 750 mg/150 mL  Status:  Discontinued        750 mg 150 mL/hr over 60 Minutes Intravenous Every 12 hours 05/24/20 0813 05/29/20 1151   05/24/20 0900  vancomycin (VANCOREADY) IVPB 1500 mg/300 mL        1,500 mg 150 mL/hr over 120 Minutes Intravenous  Once 05/24/20 0813 05/24/20 1221   05/17/20 0900  fluconazole (DIFLUCAN) IVPB 400 mg  Status:  Discontinued        400 mg 100 mL/hr over 120 Minutes Intravenous Every 24 hours 05/17/20 0749 06/05/20 0915    05/17/20 0600  vancomycin (VANCOREADY) IVPB 750 mg/150 mL  Status:  Discontinued        750 mg 150 mL/hr over 60 Minutes Intravenous Every 8 hours 05/16/20 2154 05/17/20 0903   05/17/20 0400  vancomycin (VANCOREADY) IVPB 750 mg/150 mL  Status:  Discontinued        750 mg 150 mL/hr over 60 Minutes Intravenous Every 8 hours 05/16/20 1945 05/16/20 2154   05/16/20 2200  vancomycin (VANCOREADY) IVPB 1250 mg/250 mL        1,250 mg 166.7 mL/hr over 90 Minutes Intravenous STAT 05/16/20 2153 05/16/20 2348   05/16/20 2000  vancomycin (VANCOREADY) IVPB 1250 mg/250 mL  Status:  Discontinued        1,250 mg 166.7 mL/hr over 90 Minutes Intravenous  Once 05/16/20 1945 05/16/20 2153   05/15/20 1600  piperacillin-tazobactam (ZOSYN) IVPB 3.375 g  Status:  Discontinued        3.375 g 12.5 mL/hr over 240 Minutes Intravenous Every 8 hours 05/15/20 0945 05/30/20 0912   05/15/20 0930  piperacillin-tazobactam (ZOSYN) IVPB 3.375 g        3.375 g 100 mL/hr over 30 Minutes Intravenous  Once 05/15/20 0921 05/15/20 1009   05/13/20 2000  azithromycin (ZITHROMAX) tablet 500 mg        500 mg Oral Daily 05/13/20 1155 05/14/20 2127   05/10/20 2200  cefTRIAXone (ROCEPHIN) 2 g in sodium chloride 0.9 % 100 mL IVPB  Status:  Discontinued        2 g 200 mL/hr over 30 Minutes Intravenous Every 24 hours 05/10/20 2148 05/15/20 0911   05/10/20 2200  azithromycin (ZITHROMAX) 500 mg in sodium chloride 0.9 % 250 mL IVPB  Status:  Discontinued        500 mg 250 mL/hr over 60 Minutes Intravenous Every 24 hours 05/10/20 2148 05/13/20 1155      Subjective: He is resting in bed.  Staff reported he did not sleep well last night.  Requesting something for sleep and requesting for chest PT scheduled as patient not able to clear secretions. Objective: Vitals:   06/11/20 0317 06/11/20 0749 06/11/20 1000 06/11/20 1114  BP:   138/90 (!) 141/88  Pulse:   (!) 120 (!) 126  Resp:   (!) 23 20  Temp:  97.8 F (36.6 C)  98.7 F (37.1 C)    TempSrc:  Oral  Oral  SpO2:   96% 98%  Weight: 59.2 kg     Height:        Intake/Output Summary (Last 24 hours) at 06/11/2020 1341 Last data filed at 06/11/2020 0600 Gross per 24 hour  Intake 1590 ml  Output 150 ml  Net  1440 ml   Filed Weights   06/08/20 0548 06/09/20 0317 06/11/20 0317  Weight: 60.2 kg 59.9 kg 59.2 kg    Examination:  General exam: Appears calm and comfortable  Respiratory system: Scattered rhonchi bilaterally to auscultation. Respiratory effort normal. Cardiovascular system: S1 & S2 heard, tachycardic . No JVD, murmurs, rubs, gallops or clicks. No pedal edema. Gastrointestinal system: Abdomen is nondistended, soft and nontender. No organomegaly or masses felt. Normal bowel sounds heard.  PEG tube in place Central nervous system: Alert and oriented chronic right-sided weakness from prior stroke  extremities: Symmetric 5 x 5 power. Skin: No rashes, lesions or ulcers Psychiatry: Judgement and insight appear normal. Mood & affect appropriate.     Data Reviewed: I have personally reviewed following labs and imaging studies  CBC: Recent Labs  Lab 06/05/20 0106 06/10/20 1332  WBC 9.2 10.2  NEUTROABS 7.6  --   HGB 8.4* 9.1*  HCT 27.6* 30.3*  MCV 85.2 82.8  PLT 545* 237*   Basic Metabolic Panel: Recent Labs  Lab 06/05/20 0106 06/06/20 0017 06/10/20 1332  NA 134* 134* 128*  K 4.2 4.4 4.6  CL 102 104 96*  CO2 22 22 24   GLUCOSE 145* 152* 129*  BUN 13 13 12   CREATININE 0.79 0.66 0.65  CALCIUM 8.5* 8.4* 8.5*   GFR: Estimated Creatinine Clearance: 82.2 mL/min (by C-G formula based on SCr of 0.65 mg/dL). Liver Function Tests: Recent Labs  Lab 06/05/20 0106 06/10/20 1332  AST 89* 53*  ALT 205* 157*  ALKPHOS 274* 206*  BILITOT 0.4 0.4  PROT 7.4 7.8  ALBUMIN 1.9* 2.1*   No results for input(s): LIPASE, AMYLASE in the last 168 hours. No results for input(s): AMMONIA in the last 168 hours. Coagulation Profile: No results for input(s): INR,  PROTIME in the last 168 hours. Cardiac Enzymes: No results for input(s): CKTOTAL, CKMB, CKMBINDEX, TROPONINI in the last 168 hours. BNP (last 3 results) No results for input(s): PROBNP in the last 8760 hours. HbA1C: No results for input(s): HGBA1C in the last 72 hours. CBG: Recent Labs  Lab 06/10/20 1103 06/10/20 1556 06/10/20 2105 06/11/20 0643 06/11/20 1102  GLUCAP 153* 154* 122* 146* 120*   Lipid Profile: No results for input(s): CHOL, HDL, LDLCALC, TRIG, CHOLHDL, LDLDIRECT in the last 72 hours. Thyroid Function Tests: No results for input(s): TSH, T4TOTAL, FREET4, T3FREE, THYROIDAB in the last 72 hours. Anemia Panel: No results for input(s): VITAMINB12, FOLATE, FERRITIN, TIBC, IRON, RETICCTPCT in the last 72 hours. Sepsis Labs: No results for input(s): PROCALCITON, LATICACIDVEN in the last 168 hours.  Recent Results (from the past 240 hour(s))  Culture, blood (x 2)     Status: None   Collection Time: 06/02/20  3:18 PM   Specimen: BLOOD LEFT HAND  Result Value Ref Range Status   Specimen Description BLOOD LEFT HAND  Final   Special Requests   Final    BOTTLES DRAWN AEROBIC AND ANAEROBIC Blood Culture adequate volume   Culture   Final    NO GROWTH 5 DAYS Performed at [facility], [address], [place], [place] [address]    Report Status 06/07/2020 FINAL  Final  Culture, blood (x 2)     Status: None   Collection Time: 06/02/20  3:24 PM   Specimen: BLOOD RIGHT HAND  Result Value Ref Range Status   Specimen Description BLOOD RIGHT HAND  Final   Special Requests   Final    BOTTLES DRAWN AEROBIC AND ANAEROBIC Blood  Culture adequate volume   Culture   Final    NO GROWTH 5 DAYS Performed at [facility], [place] [address], [place], [place] [address]    Report Status 06/07/2020 FINAL  Final  Culture, blood (routine x 2)     Status: None (Preliminary result)   Collection Time: 06/10/20  1:37 PM   Specimen: BLOOD RIGHT HAND  Result Value Ref Range Status    Specimen Description BLOOD RIGHT HAND  Final   Special Requests   Final    BOTTLES DRAWN AEROBIC AND ANAEROBIC Blood Culture adequate volume   Culture   Final    NO GROWTH < 24 HOURS Performed at [facility], [place] [address], [place], [place] [address]    Report Status PENDING  Incomplete  Culture, blood (routine x 2)     Status: None (Preliminary result)   Collection Time: 06/10/20  1:43 PM   Specimen: BLOOD LEFT HAND  Result Value Ref Range Status   Specimen Description BLOOD LEFT HAND  Final   Special Requests   Final    BOTTLES DRAWN AEROBIC ONLY Blood Culture results may not be optimal due to an inadequate volume of blood received in culture bottles   Culture   Final    NO GROWTH < 24 HOURS Performed at [facility], [place] [address], [place], [place] [address]    Report Status PENDING  Incomplete         Radiology Studies: DG Chest 1 View  Result Date: 06/10/2020 CLINICAL DATA:  Fever and shortness of breath. EXAM: CHEST  1 VIEW COMPARISON:  June 04, 2020 FINDINGS: Stable esophageal stent. Opacity and effusion in the right base remains, more focal medially on this study. The cardiomediastinal silhouette is stable. No pneumothorax. The left lung is clear. No other acute abnormalities. IMPRESSION: 1. Stable esophageal stent. 2. The opacity in the right base is more focal medially. There is some volume loss suggesting a component of atelectasis. Likely associated effusion. Electronically Signed   By: Dorise Bullion III M.D   On: 06/10/2020 15:46        Scheduled Meds: . apixaban  5 mg Oral BID  . bisacodyl  10 mg Oral Daily  . feeding supplement  1 Container Oral BID BM  . feeding supplement (PROSource TF)  45 mL Per Tube BID  . free water  150 mL Per Tube Q4H  . insulin aspart  0-24 Units Subcutaneous TID AC & HS  . mouth rinse  15 mL Mouth Rinse q12n4p  . metoprolol tartrate  50 mg Per Tube BID  . pantoprazole sodium  40 mg Per Tube BID  . sennosides  5  mL Per Tube QHS  . traZODone  50 mg Oral QHS   Continuous Infusions: . sodium chloride 1,000 mL (06/06/20 0028)  . sodium chloride    . feeding supplement (OSMOLITE 1.5 CAL) 1,000 mL (06/11/20 1108)     LOS: 31 days     Georgette Shell, MD  06/11/2020, 1:41 PM

## 2020-06-11 NOTE — Progress Notes (Signed)
      [address]       [place] [address]             [phone]       Working on SNF placement. Patient will  need a COVID test and be afebrile for 24 hours before he is admitted. On chest PT for secretions. Chart reviewed.    Nicholes Rough, PA-C

## 2020-06-12 LAB — CBC
HCT: 34.6 % — ABNORMAL LOW (ref 39.0–52.0)
Hemoglobin: 10 g/dL — ABNORMAL LOW (ref 13.0–17.0)
MCH: 24.3 pg — ABNORMAL LOW (ref 26.0–34.0)
MCHC: 28.9 g/dL — ABNORMAL LOW (ref 30.0–36.0)
MCV: 84.2 fL (ref 80.0–100.0)
Platelets: 551 10*3/uL — ABNORMAL HIGH (ref 150–400)
RBC: 4.11 MIL/uL — ABNORMAL LOW (ref 4.22–5.81)
RDW: 15.6 % — ABNORMAL HIGH (ref 11.5–15.5)
WBC: 13.1 10*3/uL — ABNORMAL HIGH (ref 4.0–10.5)
nRBC: 0 % (ref 0.0–0.2)

## 2020-06-12 LAB — BASIC METABOLIC PANEL
Anion gap: 13 (ref 5–15)
BUN: 15 mg/dL (ref 6–20)
CO2: 26 mmol/L (ref 22–32)
Calcium: 9.2 mg/dL (ref 8.9–10.3)
Chloride: 93 mmol/L — ABNORMAL LOW (ref 98–111)
Creatinine, Ser: 0.75 mg/dL (ref 0.61–1.24)
GFR calc Af Amer: >60 mL/min (ref >60)
GFR calc non Af Amer: >60 mL/min (ref >60)
Glucose, Bld: 133 mg/dL — ABNORMAL HIGH (ref 70–99)
Potassium: 4.8 mmol/L (ref 3.5–5.1)
Sodium: 132 mmol/L — ABNORMAL LOW (ref 135–145)

## 2020-06-12 LAB — GLUCOSE, CAPILLARY
Glucose-Capillary: 126 mg/dL — ABNORMAL HIGH (ref 70–99)
Glucose-Capillary: 135 mg/dL — ABNORMAL HIGH (ref 70–99)
Glucose-Capillary: 146 mg/dL — ABNORMAL HIGH (ref 70–99)
Glucose-Capillary: 151 mg/dL — ABNORMAL HIGH (ref 70–99)

## 2020-06-12 NOTE — Progress Notes (Addendum)
      [address]       [place] [address]             [phone]       Chart reviewed. Medically ready for discharge however, insurance continues to be a barrier for SNF placement.   Nicholes Rough, PA-C  Agree with above. Awaiting dispo Will scheduled stent eval on 9/13  Mico Spark O Renalda Locklin

## 2020-06-12 NOTE — Progress Notes (Signed)
Nutrition Follow-up  RD working remotely.  DOCUMENTATION CODES:   Non-severe (moderate) malnutrition in context of chronic illness  INTERVENTION:   Continue tube feeding via PEG: - Osmolite 1.5 @ 60 ml/hr(1440 ml/day) - ProSource TF 45 ml BID - Free water flushes per MD  Tube feeding regimenprovides 2240kcal, 112grams of protein, and 1061m of H2O.  - d/c Boost Breeze due to pt's refusal  NUTRITION DIAGNOSIS:   Moderate Malnutrition related to chronic illness (Barrett's esophagus, GERD) as evidenced by moderate fat depletion, moderate muscle depletion.  Ongoing  GOAL:   Patient will meet greater than or equal to 90% of their needs  Met via TF  MONITOR:   Diet advancement, Labs, Weight trends, TF tolerance, Skin, I & O's  REASON FOR ASSESSMENT:   Consult Enteral/tube feeding initiation and management  ASSESSMENT:   59year old male who presented on 7/28 with fever, SOB. PMH of stroke, HLD, GERD, Barrett's esophagus, PUD, s/p balloon stricturoplasty for GE junction in 2020. Admitted with severe sepsis secondary to bilateral pleural effusions with concomitant bibasilar pneumonia.  8/02 - MBSS with recommendation for regular diet with thin liquids, s/p thoracentesis 8/04 - s/p chest tube insertion, chest CT showing esophageal perforation 8/05 - s/p VATS, esophageal stent placement, PEG tube placement 8/13- s/pright thoracoscopy, drainage of loculated pleural effusion, esophagogastroscopy,esophageal stent placement, esophagram with Omnipaque 8/16 - swallow study showing no leak 8/19 - diet advanced to clear liquids 8/25 - FEES showing no observable oropharyngeal dysphagia (other than oral aversion) with recommendation for thin liquids  TOC current working on SNF placement. Noted free water flushes d/c on 8/30 due to hyponatremia. JP drain has been removed.  Per notes, pt's PO intake remains limited and pt requiring encouragement to participate in sessions  with SLP. Pt remains on clear liquid diet with 0-10% meal completions charted. No meal completions charted since 8/24. Pt refusing most Boost Breeze supplements. RD will d/c.  Weight fairly stable since increase in TF regimen. Will continue to monitor trends. No edema documented.  Admit weight: 67.5 kg Current weight: 58.6 kg  Current TF: Osmolite 1.5 @ 60 ml/hr, ProSource TF 45 ml BID  Medications reviewed and include: dulcolax, Boost Breeze BID, SSI QID, protonix, senokot  Labs reviewed: sodium 128 (on 8/29), elevated LFTs, hemoglobin 9.1 CBG's: 115-151 x 24 hours  UOP: 1100 ml x 24 hours I/O's: +8.9 L since admit  Diet Order:   Diet Order            Diet clear liquid Room service appropriate? Yes; Fluid consistency: Thin  Diet effective now                 EDUCATION NEEDS:   Education needs have been addressed  Skin:  Skin Assessment: Skin Integrity Issues: Stage II: R buttocks Incisions: right chest Other: skin tear to L buttocks  Last BM:  06/11/20 per pt report  Height:   Ht Readings from Last 1 Encounters:  05/17/20 '5\' 9"'  (1.753 m)    Weight:   Wt Readings from Last 1 Encounters:  06/12/20 58.6 kg    Ideal Body Weight:  72.7 kg  BMI:  Body mass index is 19.08 kg/m.  Estimated Nutritional Needs:   Kcal:  2000-2200  Protein:  100-115 grams  Fluid:  >/= 2.0 L    KGaynell Face MS, RD, LDN Inpatient Clinical Dietitian Please see AMiON for contact information.

## 2020-06-12 NOTE — Progress Notes (Signed)
Physical Therapy Treatment Patient Details Name: Evan Chavez MRN: [id number] DOB: 10/28/1960 Today's Date: 06/12/2020    History of Present Illness Pt is 59 yo male presents to the hospital on 7/28 with generalized weakness, productive cough and low-grade fever. Pt is currently being treated for hydropneumothorax, Sepsis aspiration pneumonia and esophagal rupture, AKI and acute delirium. s/p R thoracentesis on 8/2. Chest tube placed 8/2-8/9. s/p VATS, decortication, EGD, PEG tube placement, and a esophageal stent on 8/9. PMH of GERD, Barrett's esophagus, PUD with extensive gastric erosion, duodenal ulcer and esophageal stricture, stroke, HLD, BPH, cocaine abuse. s/p Right thoracoscopy, chest tube placement, esophageal stent placement 8/13-8/20.    PT Comments    Pt pleasant with flat affect and very limited verbalizations with cues required to state responses not just nod. Pt with improved gait this session with continued need for assist with stepping with RW use. Pt educated for increased mobility and reports current gait is similar to distance and assist required PTA. Will continue to follow. Pt with pulse ox on toe and reading varied from 83-100% on RA without accurate pleth.  HR 114    Follow Up Recommendations  SNF     Equipment Recommendations  3in1 (PT);Rolling walker with 5" wheels    Recommendations for Other Services       Precautions / Restrictions Precautions Precautions: Fall Precaution Comments: PEG, right weakness    Mobility  Bed Mobility Overal bed mobility: Needs Assistance Bed Mobility: Supine to Sit     Supine to sit: HOB elevated;Min assist     General bed mobility comments: cues for sequence and initiation with HOB 15 degrees, use of rail and assist to pivot legs  Transfers Overall transfer level: Needs assistance     Sit to Stand: Min assist         General transfer comment: cues for hand placement with assist to  rise  Ambulation/Gait Ambulation/Gait assistance: Min assist Gait Distance (Feet): 36 Feet Assistive device: Rolling walker (2 wheeled) Gait Pattern/deviations: Step-to pattern;Trunk flexed;Narrow base of support   Gait velocity interpretation: <1.8 ft/sec, indicate of risk for recurrent falls General Gait Details: pt with tendency to keep body partially rotated in walker with partial side stepping to progress gait. Flexed trunk with max cues to extend trunk, step into RW and correct direction. Pt able to state fatigue with noted increased flexion with fatigue   Stairs             Wheelchair Mobility    Modified Rankin (Stroke Patients Only)       Balance Overall balance assessment: Needs assistance Sitting-balance support: Feet supported;No upper extremity supported Sitting balance-Leahy Scale: Fair       Standing balance-Leahy Scale: Poor Standing balance comment: walker for static standing                            Cognition Arousal/Alertness: Awake/alert Behavior During Therapy: Flat affect Overall Cognitive Status: Impaired/Different from baseline Area of Impairment: Following commands;Problem solving                   Current Attention Level: Sustained   Following Commands: Follows one step commands consistently;Follows one step commands with increased time   Awareness: Emergent Problem Solving: Slow processing;Decreased initiation;Requires verbal cues General Comments: Pt with very little verbalization. Requires repetition.      Exercises General Exercises - Lower Extremity Long Arc Quad: AROM;Both;10 reps;Seated Hip Flexion/Marching: AROM;Both;10 reps;Seated  General Comments        Pertinent Vitals/Pain Pain Assessment: No/denies pain    Home Living                      Prior Function            PT Goals (current goals can now be found in the care plan section) Progress towards PT goals: Progressing toward  goals    Frequency    Min 2X/week      PT Plan Current plan remains appropriate    Co-evaluation              AM-PAC PT "6 Clicks" Mobility   Outcome Measure  Help needed turning from your back to your side while in a flat bed without using bedrails?: A Little Help needed moving from lying on your back to sitting on the side of a flat bed without using bedrails?: A Little Help needed moving to and from a bed to a chair (including a wheelchair)?: A Little Help needed standing up from a chair using your arms (e.g., wheelchair or bedside chair)?: A Little Help needed to walk in hospital room?: A Little Help needed climbing 3-5 steps with a railing? : A Lot 6 Click Score: 17    End of Session Equipment Utilized During Treatment: Gait belt Activity Tolerance: Patient limited by fatigue Patient left: in chair;with call bell/phone within reach;with chair alarm set Nurse Communication: Mobility status PT Visit Diagnosis: Unsteadiness on feet (R26.81);Muscle weakness (generalized) (M62.81)     Time: 3832-9191 PT Time Calculation (min) (ACUTE ONLY): 18 min  Charges:  $Gait Training: 8-22 mins                     Bayard Males, PT Acute Rehabilitation Services Pager: [phone] Office: [place] 06/12/2020, 12:02 PM

## 2020-06-12 NOTE — Plan of Care (Signed)

## 2020-06-12 NOTE — Progress Notes (Signed)
PROGRESS NOTE    Evan Chavez  WJX:[id number] DOB: Aug 01, 1961 DOA: 05/10/2020 PCP: Rosita Fire, MD    Brief Narrative: Evan Chavez is a 59 y.o. year old male with medical history significant for GERD, Barrett's esophagus, peptic ulcer disease with extensive gastric erosion, duodenal ulcerand esophageal stricture status post balloon dilatation on 12/2018, also with history of prior stroke, hyperlipidemia, BPH, remote cocaine abuse and prior CVA who presented on 05/10/2020 to [facility] with generalized weakness, productive cough and low-grade fever shortness of breath initially admitted for pneumonia based off CTA chest showing bibasilar consolidation pleural effusions treated with ceftriaxone and azithromycin. Hospital course complicated by right-sided hydropneumothorax with possible lung collapse after right thoracentesis on 8/2, esophageal perforation (CT chest on 8/4)With large right-sided hydropneumothorax, complicated by aspiration pneumonia.  Patient underwent right-sided chest tube placement was transferred from [facility] to [facility] where he underwent VATS and decortication by CT surgery as well as esophageal stent placement and PEG tube placement on 8/4-8/5.  Patient has been managed on hospitalist service since.  Did have episodes of persistent leakage from esophagus on esophagram as well as requiring right thorascopic for drainage of loculated pleural effusion on 8/13 by CT surgery,  repeat esophagram on 8/16 did not show any esophageal leak. Currently hospitalist service is following as a consult and CT surgery is primary team.  Patient was restarted on broad spectrum antibiotics on 8/21 due to sepsis secondary to RLL pna evident on CXR and CT chest. His known pleural effusion showed slight decrease in size with appropriate position of chest tube on last CT chest on 8/21. Given his persistent tachycardia and tachypnea repeat CXR obtained on 8/23 again noted stable RLL Pna  and atlectasis.  Currently hospital course complicated by odynophagia/dysphagia and pocketing of oral secretions and poor adherence to pulmonary toilet. Speech is evaluating and assisting with planned FEES.    Assessment & Plan:   Active Problems:   Essential hypertension   Atrial tachycardia (HCC)   Dyslipidemia   CAP (community acquired pneumonia)   Transaminitis   Lobar pneumonia ([place])   Sepsis due to undetermined organism ([place])   Fever   Gastroesophageal reflux disease with esophagitis   Shortness of breath   Thyroid nodule   BPH (benign prostatic hyperplasia)   Loculated pleural effusion   Ureteral stone with hydronephrosis   Pneumothorax on right   Acute respiratory failure with hypoxia (HCC)   Chronic bilateral pleural effusions   Parapneumonic effusion   Malnutrition of moderate degree   Esophageal perforation   Pressure injury of skin   Atrial fibrillation with RVR (HCC)   Esophageal anastomotic leak   Atelectasis   Right lower lobe pulmonary infiltrate   Sinus tachycardia   Dysphagia   Respiratory distress   Status post thoracentesis   #1 acute hypoxic respiratory failure secondary to pneumonia/parapneumonic effusion/right apical pneumothorax status post chest tube placement and chest tube removed 06/01/2020 with no pneumothorax on repeat chest x-ray.  Patient completed a course of fluconazole for Candida para silicosis. Chest x-ray 06/11/2020 small effusion otherwise patent esophageal stent.  #2 sepsis present on admission due to esophageal rupture aspiration pneumonia and mediastinitis.  CT chest at the time of admission shows right lower lobe effusion and empyema.  Patient received a course of cefepime the last dose was on 06/08/2020.  MRSA PCR was negative.  T-max 98.7.  He is chronically tachycardic but asymptomatic.   #3 esophageal rupture status post chest tube which was removed 06/01/2020.  #  4 oropharyngeal dysphagia continue tube feeds  #5 acute on  chronic blood loss anemia stable.  This is thought to be secondary to blood loss from recent esophageal rupture and surgery as well as hematemesis.    #6 severe deconditioning seen by PT recommends SNF.  Case manager aware.covid snf ordered 8/30 negative.  #7 history of paroxysmal atrial fibrillation currently in sinus rhythm.  Was seen by cardiology recommended to continue metoprolol 50 twice a day and to discontinue diltiazem.  On Eliquis 5 mg twice daily.  #8 chronic sinus tachycardia CT chest 729 no PE.  Continue beta-blocker.  #9 history of prior stroke-aspirin on hold  Restart Lipitor prior to discharge, Lipitor on hold due to elevated LFTs.  #10 abnormal LFTs trending down continue to hold statins  #11 left hydronephrosis seen by urology recommended to continue original plan of shockwave lithotripsy as an outpatient.  This was not done during this admission due to patient's worsening respiratory status.  Urology will follow up with the patient as an outpatient.  #12 history of stroke with right-sided weakness residual.  #13 pressure injury stage II to the right buttocks see below  #14 hyponatremia DC free water given normal saline instead follow-up labs pending.     Nutrition Problem: Moderate Malnutrition Etiology: chronic illness (Barrett's esophagus, GERD)     Signs/Symptoms: moderate fat depletion, moderate muscle depletion    Interventions: Tube feeding  Estimated body mass index is 19.08 kg/m as calculated from the following:   Height as of this encounter: 5\' 9"  (1.753 m).   Weight as of this encounter: 58.6 kg.  DVT prophylaxis: scd/consult pharmacy for Eliquis for A. fib Code Status:full Family Communication: none at bedside Disposition patient is medically stable to be discharged however he needs to go to SNF and has insurance issues as a barrier for discharge.  Case manager aware.  Procedures: chest tube Antimicrobials:  Anti-infectives (From admission,  onward)   Start     Dose/Rate Route Frequency Ordered Stop   06/03/20 0200  vancomycin (VANCOCIN) IVPB 1000 mg/200 mL premix  Status:  Discontinued        1,000 mg 200 mL/hr over 60 Minutes Intravenous Every 12 hours 06/02/20 1248 06/05/20 0915   06/02/20 2200  ceFEPIme (MAXIPIME) 2 g in sodium chloride 0.9 % 100 mL IVPB        2 g 200 mL/hr over 30 Minutes Intravenous Every 8 hours 06/02/20 1251 06/08/20 2359   06/02/20 2000  piperacillin-tazobactam (ZOSYN) IVPB 3.375 g  Status:  Discontinued        3.375 g 12.5 mL/hr over 240 Minutes Intravenous Every 8 hours 06/02/20 1248 06/02/20 1249   06/02/20 1330  vancomycin (VANCOREADY) IVPB 1250 mg/250 mL        1,250 mg 166.7 mL/hr over 90 Minutes Intravenous  Once 06/02/20 1248 06/02/20 1646   06/02/20 1330  piperacillin-tazobactam (ZOSYN) IVPB 3.375 g  Status:  Discontinued        3.375 g 100 mL/hr over 30 Minutes Intravenous  Once 06/02/20 1248 06/02/20 1249   06/02/20 1215  vancomycin (VANCOCIN) IVPB 1000 mg/200 mL premix  Status:  Discontinued        1,000 mg 200 mL/hr over 60 Minutes Intravenous  Once 06/02/20 1202 06/02/20 1251   06/02/20 1215  ceFEPIme (MAXIPIME) 2 g in sodium chloride 0.9 % 100 mL IVPB        2 g 200 mL/hr over 30 Minutes Intravenous  Once 06/02/20 1202 06/02/20 1506  05/29/20 1800  vancomycin (VANCOCIN) IVPB 1000 mg/200 mL premix  Status:  Discontinued        1,000 mg 200 mL/hr over 60 Minutes Intravenous Every 12 hours 05/29/20 1151 05/30/20 0912   05/24/20 2100  vancomycin (VANCOREADY) IVPB 750 mg/150 mL  Status:  Discontinued        750 mg 150 mL/hr over 60 Minutes Intravenous Every 12 hours 05/24/20 0813 05/29/20 1151   05/24/20 0900  vancomycin (VANCOREADY) IVPB 1500 mg/300 mL        1,500 mg 150 mL/hr over 120 Minutes Intravenous  Once 05/24/20 0813 05/24/20 1221   05/17/20 0900  fluconazole (DIFLUCAN) IVPB 400 mg  Status:  Discontinued        400 mg 100 mL/hr over 120 Minutes Intravenous Every 24 hours  05/17/20 0749 06/05/20 0915   05/17/20 0600  vancomycin (VANCOREADY) IVPB 750 mg/150 mL  Status:  Discontinued        750 mg 150 mL/hr over 60 Minutes Intravenous Every 8 hours 05/16/20 2154 05/17/20 0903   05/17/20 0400  vancomycin (VANCOREADY) IVPB 750 mg/150 mL  Status:  Discontinued        750 mg 150 mL/hr over 60 Minutes Intravenous Every 8 hours 05/16/20 1945 05/16/20 2154   05/16/20 2200  vancomycin (VANCOREADY) IVPB 1250 mg/250 mL        1,250 mg 166.7 mL/hr over 90 Minutes Intravenous STAT 05/16/20 2153 05/16/20 2348   05/16/20 2000  vancomycin (VANCOREADY) IVPB 1250 mg/250 mL  Status:  Discontinued        1,250 mg 166.7 mL/hr over 90 Minutes Intravenous  Once 05/16/20 1945 05/16/20 2153   05/15/20 1600  piperacillin-tazobactam (ZOSYN) IVPB 3.375 g  Status:  Discontinued        3.375 g 12.5 mL/hr over 240 Minutes Intravenous Every 8 hours 05/15/20 0945 05/30/20 0912   05/15/20 0930  piperacillin-tazobactam (ZOSYN) IVPB 3.375 g        3.375 g 100 mL/hr over 30 Minutes Intravenous  Once 05/15/20 0921 05/15/20 1009   05/13/20 2000  azithromycin (ZITHROMAX) tablet 500 mg        500 mg Oral Daily 05/13/20 1155 05/14/20 2127   05/10/20 2200  cefTRIAXone (ROCEPHIN) 2 g in sodium chloride 0.9 % 100 mL IVPB  Status:  Discontinued        2 g 200 mL/hr over 30 Minutes Intravenous Every 24 hours 05/10/20 2148 05/15/20 0911   05/10/20 2200  azithromycin (ZITHROMAX) 500 mg in sodium chloride 0.9 % 250 mL IVPB  Status:  Discontinued        500 mg 250 mL/hr over 60 Minutes Intravenous Every 24 hours 05/10/20 2148 05/13/20 1155      Subjective: Patient resting in bed in no acute distress no new events.   Objective: Vitals:   06/11/20 2302 06/12/20 0256 06/12/20 0723 06/12/20 1055  BP: 125/90 133/88 117/71 (!) 136/94  Pulse: (!) 106 (!) 117 (!) 115   Resp: (!) 26 (!) 23 20   Temp: 98.4 F (36.9 C) (!) 97.3 F (36.3 C) 98.3 F (36.8 C) 97.7 F (36.5 C)  TempSrc: Oral Oral Oral Oral    SpO2: 95% 93% 93%   Weight:  58.6 kg    Height:        Intake/Output Summary (Last 24 hours) at 06/12/2020 1206 Last data filed at 06/11/2020 2303 Gross per 24 hour  Intake --  Output 1100 ml  Net -1100 ml   [employer]  06/09/20 0317 06/11/20 0317 06/12/20 0256  Weight: 59.9 kg 59.2 kg 58.6 kg    Examination:  General exam: Appears calm and comfortable  Respiratory system: Scattered rhonchi bilaterally to auscultation. Respiratory effort normal. Cardiovascular system: S1 & S2 heard, tachycardic . No JVD, murmurs, rubs, gallops or clicks. No pedal edema. Gastrointestinal system: Abdomen is nondistended, soft and nontender. No organomegaly or masses felt. Normal bowel sounds heard.  PEG tube in place Central nervous system: Alert and oriented chronic right-sided weakness from prior stroke  extremities: Symmetric 5 x 5 power. Skin: No rashes, lesions or ulcers Psychiatry: Judgement and insight appear normal. Mood & affect appropriate.     Data Reviewed: I have personally reviewed following labs and imaging studies  CBC: Recent Labs  Lab 06/10/20 1332  WBC 10.2  HGB 9.1*  HCT 30.3*  MCV 82.8  PLT 185*   Basic Metabolic Panel: Recent Labs  Lab 06/06/20 0017 06/10/20 1332  NA 134* 128*  K 4.4 4.6  CL 104 96*  CO2 22 24  GLUCOSE 152* 129*  BUN 13 12  CREATININE 0.66 0.65  CALCIUM 8.4* 8.5*   GFR: Estimated Creatinine Clearance: 81.4 mL/min (by C-G formula based on SCr of 0.65 mg/dL). Liver Function Tests: Recent Labs  Lab 06/10/20 1332  AST 53*  ALT 157*  ALKPHOS 206*  BILITOT 0.4  PROT 7.8  ALBUMIN 2.1*   No results for input(s): LIPASE, AMYLASE in the last 168 hours. No results for input(s): AMMONIA in the last 168 hours. Coagulation Profile: No results for input(s): INR, PROTIME in the last 168 hours. Cardiac Enzymes: No results for input(s): CKTOTAL, CKMB, CKMBINDEX, TROPONINI in the last 168 hours. BNP (last 3 results) No results for  input(s): PROBNP in the last 8760 hours. HbA1C: No results for input(s): HGBA1C in the last 72 hours. CBG: Recent Labs  Lab 06/11/20 1102 06/11/20 1624 06/11/20 2112 06/12/20 0607 06/12/20 1110  GLUCAP 120* 115* 151* 126* 135*   Lipid Profile: No results for input(s): CHOL, HDL, LDLCALC, TRIG, CHOLHDL, LDLDIRECT in the last 72 hours. Thyroid Function Tests: No results for input(s): TSH, T4TOTAL, FREET4, T3FREE, THYROIDAB in the last 72 hours. Anemia Panel: No results for input(s): VITAMINB12, FOLATE, FERRITIN, TIBC, IRON, RETICCTPCT in the last 72 hours. Sepsis Labs: No results for input(s): PROCALCITON, LATICACIDVEN in the last 168 hours.  Recent Results (from the past 240 hour(s))  Culture, blood (x 2)     Status: None   Collection Time: 06/02/20  3:18 PM   Specimen: BLOOD LEFT HAND  Result Value Ref Range Status   Specimen Description BLOOD LEFT HAND  Final   Special Requests   Final    BOTTLES DRAWN AEROBIC AND ANAEROBIC Blood Culture adequate volume   Culture   Final    NO GROWTH 5 DAYS Performed at [facility], [address] [place], [place] [address]    Report Status 06/07/2020 FINAL  Final  Culture, blood (x 2)     Status: None   Collection Time: 06/02/20  3:24 PM   Specimen: BLOOD RIGHT HAND  Result Value Ref Range Status   Specimen Description BLOOD RIGHT HAND  Final   Special Requests   Final    BOTTLES DRAWN AEROBIC AND ANAEROBIC Blood Culture adequate volume   Culture   Final    NO GROWTH 5 DAYS Performed at [facility], [place] [address], [place], [place] [address]    Report Status 06/07/2020 FINAL  Final  Culture, blood (routine x 2)     Status: None (Preliminary result)   Collection Time: 06/10/20  1:37 PM   Specimen: BLOOD RIGHT HAND  Result Value Ref Range Status   Specimen Description BLOOD RIGHT HAND  Final   Special Requests   Final    BOTTLES DRAWN AEROBIC AND ANAEROBIC Blood Culture adequate volume   Culture   Final    NO  GROWTH 2 DAYS Performed at [facility], [address], [place], [place] [address]    Report Status PENDING  Incomplete  Culture, blood (routine x 2)     Status: None (Preliminary result)   Collection Time: 06/10/20  1:43 PM   Specimen: BLOOD LEFT HAND  Result Value Ref Range Status   Specimen Description BLOOD LEFT HAND  Final   Special Requests   Final    BOTTLES DRAWN AEROBIC ONLY Blood Culture results may not be optimal due to an inadequate volume of blood received in culture bottles   Culture   Final    NO GROWTH 2 DAYS Performed at [facility], [place] [address], [place], [place] [address]    Report Status PENDING  Incomplete  SARS Coronavirus 2 by RT PCR (hospital order, performed in [facility] hospital lab) Nasopharyngeal Nasopharyngeal Swab     Status: None   Collection Time: 06/11/20  1:43 PM   Specimen: Nasopharyngeal Swab  Result Value Ref Range Status   SARS Coronavirus 2 NEGATIVE NEGATIVE Final    Comment: (NOTE) SARS-CoV-2 target nucleic acids are NOT DETECTED.  The SARS-CoV-2 RNA is generally detectable in upper and lower respiratory specimens during the acute phase of infection. The lowest concentration of SARS-CoV-2 viral copies this assay can detect is 250 copies / mL. A negative result does not preclude SARS-CoV-2 infection and should not be used as the sole basis for treatment or other patient management decisions.  A negative result may occur with improper specimen collection / handling, submission of specimen other than nasopharyngeal swab, presence of viral mutation(s) within the areas targeted by this assay, and inadequate number of viral copies (<250 copies / mL). A negative result must be combined with clinical observations, patient history, and epidemiological information.  Fact Sheet for Patients:   StrictlyIdeas.no  Fact Sheet for Healthcare Providers: BankingDealers.co.za  This test is  not yet approved or  cleared by the [place] FDA and has been authorized for detection and/or diagnosis of SARS-CoV-2 by FDA under an Emergency Use Authorization (EUA).  This EUA will remain in effect (meaning this test can be used) for the duration of the COVID-19 declaration under Section 564(b)(1) of the Act, 21 U.S.C. section 360bbb-3(b)(1), unless the authorization is terminated or revoked sooner.  Performed at [facility], [place] [address], [place], [place] [address]          Radiology Studies: DG Chest 1 View  Result Date: 06/10/2020 CLINICAL DATA:  Fever and shortness of breath. EXAM: CHEST  1 VIEW COMPARISON:  June 04, 2020 FINDINGS: Stable esophageal stent. Opacity and effusion in the right base remains, more focal medially on this study. The cardiomediastinal silhouette is stable. No pneumothorax. The left lung is clear. No other acute abnormalities. IMPRESSION: 1. Stable esophageal stent. 2. The opacity in the right base is more focal medially. There is some volume loss suggesting a component of atelectasis. Likely associated effusion. Electronically Signed   By: Dorise Bullion III M.D   On: 06/10/2020 15:46  Scheduled Meds: . apixaban  5 mg Oral BID  . bisacodyl  10 mg Oral Daily  . feeding supplement (PROSource TF)  45 mL Per Tube BID  . insulin aspart  0-24 Units Subcutaneous TID AC & HS  . mouth rinse  15 mL Mouth Rinse q12n4p  . metoprolol tartrate  50 mg Per Tube BID  . pantoprazole sodium  40 mg Per Tube BID  . sennosides  5 mL Per Tube QHS  . traZODone  50 mg Oral QHS   Continuous Infusions: . sodium chloride 1,000 mL (06/06/20 0028)  . sodium chloride    . feeding supplement (OSMOLITE 1.5 CAL) 1,000 mL (06/12/20 0933)     LOS: 32 days     Georgette Shell, MD  06/12/2020, 12:06 PM

## 2020-06-13 LAB — GLUCOSE, CAPILLARY
Glucose-Capillary: 144 mg/dL — ABNORMAL HIGH (ref 70–99)
Glucose-Capillary: 128 mg/dL — ABNORMAL HIGH (ref 70–99)
Glucose-Capillary: 133 mg/dL — ABNORMAL HIGH (ref 70–99)
Glucose-Capillary: 131 mg/dL — ABNORMAL HIGH (ref 70–99)

## 2020-06-13 LAB — BASIC METABOLIC PANEL
Anion gap: 12 (ref 5–15)
BUN: 13 mg/dL (ref 6–20)
CO2: 25 mmol/L (ref 22–32)
Calcium: 9 mg/dL (ref 8.9–10.3)
Chloride: 95 mmol/L — ABNORMAL LOW (ref 98–111)
Creatinine, Ser: 0.79 mg/dL (ref 0.61–1.24)
GFR calc Af Amer: >60 mL/min (ref >60)
GFR calc non Af Amer: >60 mL/min (ref >60)
Glucose, Bld: 118 mg/dL — ABNORMAL HIGH (ref 70–99)
Potassium: 4.3 mmol/L (ref 3.5–5.1)
Sodium: 132 mmol/L — ABNORMAL LOW (ref 135–145)

## 2020-06-13 NOTE — Progress Notes (Signed)
  Speech Language Pathology Treatment: Dysphagia  Patient Details Name: Evan Chavez MRN: [id number] DOB: 1961-04-24 Today's Date: 06/13/2020 Time: 7972-8206 SLP Time Calculation (min) (ACUTE ONLY): 11 min  Assessment / Plan / Recommendation Clinical Impression  Pt was seen for dysphagia treatment. He was alert and his level of cooperation was improvement slightly compared to prior sessions. He tolerated all trials without overt s/sx of aspiration. Bolus holding continues to be noted despite use of multiple interventions from SLP including thermal stimulation, tactile cueing, increased tactile feedback to superior lingual surface, and verbal cueing. The timeliness of pt's swallow was improved slightly with cold boluses but suctioning was still intermittently needed due to pt not swallowing boluses. It is recommended that the pt's clear liquid diet be continued. Starting p.o. intake with thermal stimulation from cold boluses (e.g., popsicles/lemon ice) may be beneficial; however, it is anticipated that the pt's performance will be inconsistent even with this intervention. Skilled SLP services will be discontinued at this time.    HPI HPI: Evan Chavez is a 59 y.o. year old male initally admitted with generalized weakness, productive cough and low-grade fever shortness of breath initially admitted for pneumonia based off CTA chest showing bibasilar consolidation pleural effusions treated with ceftriaxone and azithromycin. Pt had an MBS on 05/14/20 (prior to any notes indicating esopahgeal perforation) that showed mild oropharyngeal dysphagia with only premature spillage, mild oral residue. Pt recommended to consume regualr/thin.  Study reviewed by Dr. Elsworth Soho. On that same day, Pt underwent a Ultrasound-guided thoracentesis to define parapneumonic effusion, suspected to be from aspiration pna due to esophageal stricture. Found to have RT apical pneumothorax. Pt was then transferred to [facility] for a VATS  decortication (not done?) and Chest tube placed. Post procedure CT chest performed which showed esophageal perforation. CTCS then reported "He has a long history of peptic strictures that have been dilated on multiple occasions.  It appears as though this may be the culprit for his perforation."  Underwent esophageal stent placement and PEG tube placement on 8/4-8/5.  Did have episodes of persistent leakage from esophagus on esophagram as well as requiring right thorascopic for drainage of loculated pleural effusion on 8/13 by CT surgery,  repeat esophagram on 8/16 did not show any esophageal leak. Pt has not been accepting oral PO, or has been orally holding his secretions. Has also been treated for candida. PMH: GERD, Barrett's esophagus, peptic ulcer disease with extensive gastric erosion, duodenal ulcerand esophageal stricture status post balloon dilatation on 12/2018, also with history of prior stroke, hyperlipidemia, BPH, remote cocaine abuse and prior CVA.      SLP Plan  Continue with current plan of care       Recommendations  Diet recommendations: Thin liquid Medication Administration: Whole meds with liquid Compensations: Slow rate;Small sips/bites Postural Changes and/or Swallow Maneuvers: Seated upright 90 degrees                Oral Care Recommendations: Oral care QID Follow up Recommendations: Skilled Nursing facility SLP Visit Diagnosis: Dysphagia, unspecified (R13.10) Plan: Continue with current plan of care       Jarrin Staley I. Hardin Negus, [place], [place] Office number [phone] Pager [place] 06/13/2020, 3:05 PM

## 2020-06-13 NOTE — Progress Notes (Signed)
PROGRESS NOTE    [name]  DJS:[id number] DOB: [birth year] DOA: 05/10/2020 PCP: Rosita Fire, MD   Brief Narrative: Evan Chavez is a 59 y.o. malewith medical history significant for GERD, Barrett's esophagus, peptic ulcer disease with extensive gastric erosion, duodenal ulcerand esophageal stricture status post balloon dilatation on 12/2018, also with history of prior stroke, hyperlipidemia, BPH, remote cocaine abuse and prior CVA. Patient presented secondary to weakness, productive cough, fever and dyspnea and found to have pneumonia.   Hospital course complicated by right-sided hydropneumothorax with possible lung collapse after right thoracentesis on 8/2, esophageal perforation (CT chest on 8/4)With large right-sided hydropneumothorax, complicated by aspiration pneumonia. Patient underwent right-sided chest tube placement was transferred from [facility] to [facility] where he underwent VATS and decortication by CT surgery as well as esophageal stent placement and PEG tube placement on 8/4-8/5. Patient has been managed on hospitalist service since. Did have episodes of persistent leakage from esophagus on esophagram as well as requiring right thorascopic for drainage of loculated pleural effusion on 8/13 by CT surgery, repeat esophagram on 8/16 did not show any esophageal leak.   Assessment & Plan:   Active Problems:   Essential hypertension   Atrial tachycardia (HCC)   Dyslipidemia   CAP (community acquired pneumonia)   Transaminitis   Lobar pneumonia ([place])   Sepsis due to undetermined organism ([place])   Fever   Gastroesophageal reflux disease with esophagitis   Shortness of breath   Thyroid nodule   BPH (benign prostatic hyperplasia)   Loculated pleural effusion   Ureteral stone with hydronephrosis   Pneumothorax on right   Acute respiratory failure with hypoxia (HCC)   Chronic bilateral pleural effusions   Parapneumonic effusion   Malnutrition of  moderate degree   Esophageal perforation   Pressure injury of skin   Atrial fibrillation with RVR (HCC)   Esophageal anastomotic leak   Atelectasis   Right lower lobe pulmonary infiltrate   Sinus tachycardia   Dysphagia   Respiratory distress   Status post thoracentesis   Acute respiratory failure with hypoxia Secondary to below. Resolved.  Pneumonia Parapneumonic effusion Chest tube placed and now removed. Culture significant for candida parapsilosis. Patient started on Fluconazole and completed course. Also managed on Cefepime and completed course.  Apical pneumothorax Chest tube placed and managed by CT surgery. Removed.  Sepsis Present on admission initially with recurrent sepsis physiology during hospitalization. In setting of aspiration pneumonia, empyema and mediastinitis. Patient treated empirically with Cefepime. Physiology mostly resolved except for some tachycardia. Leukocytosis slightly elevated -Repeat CBC  Esophageal rupture Stent placed by cardiothoracic surgery  Oropharyngeal dysphagia -SLP recommendations: thin liquid -Continue tube feeds  Acute on chronic blood loss anemia In setting of blood loss from esophageal rupture/surgery in addition to hematemesis which have resolved. Stable.  Paroxysmal atrial fibrillation -Continue metoprolol and Eliquis  Chronic sinus tachycardia No etiology found other than debility. No PE on CTA. Currently on metoprolol -Continue metoprolol  History of stroke On Lipitor as an outpatient which was held secondary to elevated LFTs  Elevated LFTs Trended down. -Repeat LFTs in AM  Left hydronephrosis Plan for shockwave lithotripsy as an outpatient.  History of stroke Chronic right-sided weakness.  Pressure injury Stage II of the right buttock  Hyponatremia Improved.   DVT prophylaxis: Eliquis Code Status:   Code Status: Full Code Family Communication: None at bedside Disposition Plan: Discharge likely in 2  days pending bed availability and continued medical stability   Consultants:  Cardiothoracic surgery  Procedures:   VATS/DECORTICATION/PEG PLACEMENT/ESOPHAGEAL STENT PLACEMENT (05/17/2020)  RIGHT THORACOSCOPY/SOPHAGOGASTROSCOPY/ESOPHAGEAL STENT PLACEMENT/ESOPHAGRAM  Antimicrobials:  Vancomycin  Zosyn  Doxycycline  Ceftriaxone  Azithromycin  Amoxicillin    Subjective: No concerns  Objective: Vitals:   06/12/20 2131 06/12/20 2343 06/13/20 0338 06/13/20 0811  BP: (!) 139/94 (!) 134/92 127/90 (!) 129/91  Pulse: (!) 137 (!) 112 (!) 122 (!) 128  Resp:  (!) 24 (!) 25 16  Temp:  99.1 F (37.3 C) 98.8 F (37.1 C) 98.6 F (37 C)  TempSrc:  Oral Oral Oral  SpO2:  94% 95% 95%  Weight:   59.4 kg   Height:        Intake/Output Summary (Last 24 hours) at 06/13/2020 1059 Last data filed at 06/12/2020 1619 Gross per 24 hour  Intake 2059 ml  Output 450 ml  Net 1609 ml   Filed Weights   06/11/20 0317 06/12/20 0256 06/13/20 0338  Weight: 59.2 kg 58.6 kg 59.4 kg    Examination:  General exam: Appears calm and comfortable Respiratory system: Transmitted rhonchi sounds that are intermittent. Respiratory effort normal. Cardiovascular system: S1 & S2 heard, Tachycardia with normal rhythm. No murmurs, rubs, gallops or clicks. Gastrointestinal system: Abdomen is nondistended, soft and nontender. No organomegaly or masses felt. Normal bowel sounds heard. Central nervous system: Alert.  Musculoskeletal: No edema. No calf tenderness Skin: No cyanosis. No rashes    Data Reviewed: I have personally reviewed following labs and imaging studies  CBC Lab Results  Component Value Date   WBC 13.1 (H) 06/12/2020   RBC 4.11 (L) 06/12/2020   HGB 10.0 (L) 06/12/2020   HCT 34.6 (L) 06/12/2020   MCV 84.2 06/12/2020   MCH 24.3 (L) 06/12/2020   PLT 551 (H) 06/12/2020   MCHC 28.9 (L) 06/12/2020   RDW 15.6 (H) 06/12/2020   LYMPHSABS 0.8 06/05/2020   MONOABS 0.8 06/05/2020   EOSABS  0.1 06/05/2020   BASOSABS 0.0 93/79/0240     Last metabolic panel Lab Results  Component Value Date   NA 132 (L) 06/13/2020   K 4.3 06/13/2020   CL 95 (L) 06/13/2020   CO2 25 06/13/2020   BUN 13 06/13/2020   CREATININE 0.79 06/13/2020   GLUCOSE 118 (H) 06/13/2020   GFRNONAA >60 06/13/2020   GFRAA >60 06/13/2020   CALCIUM 9.0 06/13/2020   PHOS 3.5 05/30/2020   PROT 7.8 06/10/2020   ALBUMIN 2.1 (L) 06/10/2020   BILITOT 0.4 06/10/2020   ALKPHOS 206 (H) 06/10/2020   AST 53 (H) 06/10/2020   ALT 157 (H) 06/10/2020   ANIONGAP 12 06/13/2020    CBG (last 3)  Recent Labs    06/12/20 1511 06/12/20 2130 06/13/20 0624  GLUCAP 146* 151* 144*     GFR: Estimated Creatinine Clearance: 82.5 mL/min (by C-G formula based on SCr of 0.79 mg/dL).  Coagulation Profile: No results for input(s): INR, PROTIME in the last 168 hours.  Recent Results (from the past 240 hour(s))  Culture, blood (routine x 2)     Status: None (Preliminary result)   Collection Time: 06/10/20  1:37 PM   Specimen: BLOOD RIGHT HAND  Result Value Ref Range Status   Specimen Description BLOOD RIGHT HAND  Final   Special Requests   Final    BOTTLES DRAWN AEROBIC AND ANAEROBIC Blood Culture adequate volume   Culture   Final    NO GROWTH 3 DAYS Performed at [facility], [place] [address], [place], [place] [address]  Report Status PENDING  Incomplete  Culture, blood (routine x 2)     Status: None (Preliminary result)   Collection Time: 06/10/20  1:43 PM   Specimen: BLOOD LEFT HAND  Result Value Ref Range Status   Specimen Description BLOOD LEFT HAND  Final   Special Requests   Final    BOTTLES DRAWN AEROBIC ONLY Blood Culture results may not be optimal due to an inadequate volume of blood received in culture bottles   Culture   Final    NO GROWTH 3 DAYS Performed at [facility], [place] [address], [place], [place] [address]    Report Status PENDING  Incomplete  SARS Coronavirus 2 by RT PCR  (hospital order, performed in [facility] hospital lab) Nasopharyngeal Nasopharyngeal Swab     Status: None   Collection Time: 06/11/20  1:43 PM   Specimen: Nasopharyngeal Swab  Result Value Ref Range Status   SARS Coronavirus 2 NEGATIVE NEGATIVE Final    Comment: (NOTE) SARS-CoV-2 target nucleic acids are NOT DETECTED.  The SARS-CoV-2 RNA is generally detectable in upper and lower respiratory specimens during the acute phase of infection. The lowest concentration of SARS-CoV-2 viral copies this assay can detect is 250 copies / mL. A negative result does not preclude SARS-CoV-2 infection and should not be used as the sole basis for treatment or other patient management decisions.  A negative result may occur with improper specimen collection / handling, submission of specimen other than nasopharyngeal swab, presence of viral mutation(s) within the areas targeted by this assay, and inadequate number of viral copies (<250 copies / mL). A negative result must be combined with clinical observations, patient history, and epidemiological information.  Fact Sheet for Patients:   StrictlyIdeas.no  Fact Sheet for Healthcare Providers: BankingDealers.co.za  This test is not yet approved or  cleared by the [place] FDA and has been authorized for detection and/or diagnosis of SARS-CoV-2 by FDA under an Emergency Use Authorization (EUA).  This EUA will remain in effect (meaning this test can be used) for the duration of the COVID-19 declaration under Section 564(b)(1) of the Act, 21 U.S.C. section 360bbb-3(b)(1), unless the authorization is terminated or revoked sooner.  Performed at [facility], [place] [address] [place], [place] [address]         Radiology Studies: No results found.      Scheduled Meds:  apixaban  5 mg Oral BID   bisacodyl  10 mg Oral Daily   feeding supplement (PROSource TF)  45 mL Per Tube BID    insulin aspart  0-24 Units Subcutaneous TID AC & HS   mouth rinse  15 mL Mouth Rinse q12n4p   metoprolol tartrate  50 mg Per Tube BID   pantoprazole sodium  40 mg Per Tube BID   sennosides  5 mL Per Tube QHS   traZODone  50 mg Oral QHS   Continuous Infusions:  sodium chloride 1,000 mL (06/06/20 0028)   sodium chloride     feeding supplement (OSMOLITE 1.5 CAL) 1,000 mL (06/13/20 0400)     LOS: 33 days     Cordelia Poche, MD [facility] 06/13/2020, 10:59 AM  If 7PM-7AM, please contact night-coverage [email or web address]

## 2020-06-13 NOTE — Plan of Care (Signed)

## 2020-06-14 LAB — BASIC METABOLIC PANEL
Anion gap: 9 (ref 5–15)
BUN: 14 mg/dL (ref 6–20)
CO2: 28 mmol/L (ref 22–32)
Calcium: 9 mg/dL (ref 8.9–10.3)
Chloride: 95 mmol/L — ABNORMAL LOW (ref 98–111)
Creatinine, Ser: 0.81 mg/dL (ref 0.61–1.24)
GFR calc Af Amer: >60 mL/min (ref >60)
GFR calc non Af Amer: >60 mL/min (ref >60)
Glucose, Bld: 139 mg/dL — ABNORMAL HIGH (ref 70–99)
Potassium: 4.5 mmol/L (ref 3.5–5.1)
Sodium: 132 mmol/L — ABNORMAL LOW (ref 135–145)

## 2020-06-14 LAB — GLUCOSE, CAPILLARY
Glucose-Capillary: 169 mg/dL — ABNORMAL HIGH (ref 70–99)
Glucose-Capillary: 124 mg/dL — ABNORMAL HIGH (ref 70–99)
Glucose-Capillary: 152 mg/dL — ABNORMAL HIGH (ref 70–99)
Glucose-Capillary: 137 mg/dL — ABNORMAL HIGH (ref 70–99)

## 2020-06-14 LAB — HEPATIC FUNCTION PANEL
ALT: 132 U/L — ABNORMAL HIGH (ref 0–44)
AST: 52 U/L — ABNORMAL HIGH (ref 15–41)
Albumin: 2.3 g/dL — ABNORMAL LOW (ref 3.5–5.0)
Alkaline Phosphatase: 148 U/L — ABNORMAL HIGH (ref 38–126)
Bilirubin, Direct: <0.1 mg/dL (ref 0.0–0.2)
Total Bilirubin: 0.4 mg/dL (ref 0.3–1.2)
Total Protein: 7.7 g/dL (ref 6.5–8.1)

## 2020-06-14 LAB — CBC
HCT: 33 % — ABNORMAL LOW (ref 39.0–52.0)
Hemoglobin: 9.6 g/dL — ABNORMAL LOW (ref 13.0–17.0)
MCH: 24.2 pg — ABNORMAL LOW (ref 26.0–34.0)
MCHC: 29.1 g/dL — ABNORMAL LOW (ref 30.0–36.0)
MCV: 83.3 fL (ref 80.0–100.0)
Platelets: 491 10*3/uL — ABNORMAL HIGH (ref 150–400)
RBC: 3.96 MIL/uL — ABNORMAL LOW (ref 4.22–5.81)
RDW: 15.6 % — ABNORMAL HIGH (ref 11.5–15.5)
WBC: 10.4 10*3/uL (ref 4.0–10.5)
nRBC: 0 % (ref 0.0–0.2)

## 2020-06-14 MED ORDER — ACETAMINOPHEN 160 MG/5ML PO SOLN
650.0000 mg | Freq: Four times a day (QID) | ORAL | Status: DC | PRN
  Administered 2020-06-15 – 2020-06-23 (×3): 650 mg via ORAL
  Filled 2020-06-14 (×3): qty 20.3

## 2020-06-14 MED ORDER — METOPROLOL TARTRATE 50 MG PO TABS
75.0000 mg | ORAL_TABLET | Freq: Two times a day (BID) | ORAL | Status: DC
  Administered 2020-06-14 – 2020-06-18 (×9): 75 mg
  Filled 2020-06-14 (×9): qty 1

## 2020-06-14 NOTE — Progress Notes (Signed)
Occupational Therapy Treatment Patient Details Name: Evan Chavez MRN: [id number] DOB: 15-Feb-1961 Today's Date: 06/14/2020    History of present illness Pt is 59 yo male presents to the hospital on 7/28 with generalized weakness, productive cough and low-grade fever. Pt is currently being treated for hydropneumothorax, Sepsis aspiration pneumonia and esophagal rupture, AKI and acute delirium. s/p R thoracentesis on 8/2. Chest tube placed 8/2-8/9. s/p VATS, decortication, EGD, PEG tube placement, and a esophageal stent on 8/9. PMH of GERD, Barrett's esophagus, PUD with extensive gastric erosion, duodenal ulcer and esophageal stricture, stroke, HLD, BPH, cocaine abuse. s/p Right thoracoscopy, chest tube placement, esophageal stent placement 8/13-8/20.   OT comments  Patient continues to make slow progress towards goals in skilled OT session. Patient's session encompassed functional mobility and completion of basic ADLs at EOB. Pt continues to verbalize minimally, but will nod appropriately, attempted to further progress pt, however pt could not be convinced to attempt ambulation to the sink in session. Discharge remains appropriate; will continue to follow acutely.    Follow Up Recommendations  SNF    Equipment Recommendations  Other (comment) (defer to next venue)    Recommendations for Other Services      Precautions / Restrictions Precautions Precautions: Fall Precaution Comments: PEG, right weakness Restrictions Weight Bearing Restrictions: No       Mobility Bed Mobility Overal bed mobility: Needs Assistance Bed Mobility: Supine to Sit     Supine to sit: HOB elevated;Min assist     General bed mobility comments: cues for sequence and initiation with HOB 15 degrees, use of rail and assist to pivot legs  Transfers Overall transfer level: Needs assistance Equipment used: Rolling walker (2 wheeled) Transfers: Sit to/from Stand Sit to Stand: Min assist          General transfer comment: cues for hand placement with assist to rise    Balance Overall balance assessment: Needs assistance Sitting-balance support: Feet supported;No upper extremity supported Sitting balance-Leahy Scale: Fair Sitting balance - Comments: Close min gaurd for safety.   Standing balance support: During functional activity Standing balance-Leahy Scale: Poor Standing balance comment: walker for static standing                           ADL either performed or assessed with clinical judgement   ADL Overall ADL's : Needs assistance/impaired     Grooming: Min guard;Sitting;Wash/dry face;Set up Grooming Details (indicate cue type and reason): seated EOB             Lower Body Dressing: Moderate assistance;Sitting/lateral leans Lower Body Dressing Details (indicate cue type and reason): doff socks EOB, unable to don or complete figure 4 technique             Functional mobility during ADLs: Minimal assistance;Rolling walker General ADL Comments: pt limited by decreased activity tolerance, decreased cognition;pt required minA for stand-pivot transfer to recliner, HR stable, but pt not wanting progress beyond stand pivot in session     Vision       Perception     Praxis      Cognition Arousal/Alertness: Awake/alert Behavior During Therapy: Flat affect Overall Cognitive Status: Impaired/Different from baseline Area of Impairment: Following commands;Problem solving                       Following Commands: Follows one step commands consistently;Follows one step commands with increased time   Awareness: Emergent Problem Solving: Slow processing;Decreased  initiation;Requires verbal cues General Comments: Pt with very little verbalization. Requires repetition.        Exercises     Shoulder Instructions       General Comments      Pertinent Vitals/ Pain       Pain Assessment: No/denies pain  Home Living                                           Prior Functioning/Environment              Frequency  Min 2X/week        Progress Toward Goals  OT Goals(current goals can now be found in the care plan section)  Progress towards OT goals: Progressing toward goals  Acute Rehab OT Goals Patient Stated Goal: get better OT Goal Formulation: With patient Time For Goal Achievement: 06/20/20 Potential to Achieve Goals: Good  Plan Discharge plan remains appropriate    Co-evaluation                 AM-PAC OT "6 Clicks" Daily Activity     Outcome Measure   Help from another person eating meals?: A Lot Help from another person taking care of personal grooming?: A Little Help from another person toileting, which includes using toliet, bedpan, or urinal?: A Lot Help from another person bathing (including washing, rinsing, drying)?: A Lot Help from another person to put on and taking off regular upper body clothing?: A Little Help from another person to put on and taking off regular lower body clothing?: A Lot 6 Click Score: 14    End of Session Equipment Utilized During Treatment: Rolling walker;Gait belt  OT Visit Diagnosis: Unsteadiness on feet (R26.81);Other abnormalities of gait and mobility (R26.89);Muscle weakness (generalized) (M62.81);Other symptoms and signs involving cognitive function;Feeding difficulties (R63.3);Pain   Activity Tolerance Patient limited by fatigue   Patient Left in chair;with call bell/phone within reach;with chair alarm set   Nurse Communication Mobility status;Other (comment) (condom cath off)        Time: 1308-6578 OT Time Calculation (min): 16 min  Charges: OT General Charges $OT Visit: 1 Visit OT Treatments $Self Care/Home Management : 8-22 mins  Corinne Ports E. Reizel Calzada, COTA/L Acute Rehabilitation Services [place] 06/14/2020, 11:19 AM

## 2020-06-14 NOTE — Progress Notes (Signed)
PROGRESS NOTE    Aksh Swart Boling  GGE:[id number] DOB: [birth year] DOA: 05/10/2020 PCP: Rosita Fire, MD   Brief Narrative: Evan Chavez is a 59 y.o. malewith medical history significant for GERD, Barrett's esophagus, peptic ulcer disease with extensive gastric erosion, duodenal ulcerand esophageal stricture status post balloon dilatation on 12/2018, also with history of prior stroke, hyperlipidemia, BPH, remote cocaine abuse and prior CVA. Patient presented secondary to weakness, productive cough, fever and dyspnea and found to have pneumonia.   Hospital course complicated by right-sided hydropneumothorax with possible lung collapse after right thoracentesis on 8/2, esophageal perforation (CT chest on 8/4)With large right-sided hydropneumothorax, complicated by aspiration pneumonia. Patient underwent right-sided chest tube placement was transferred from [facility] to [facility] where he underwent VATS and decortication by CT surgery as well as esophageal stent placement and PEG tube placement on 8/4-8/5. Patient has been managed on hospitalist service since. Did have episodes of persistent leakage from esophagus on esophagram as well as requiring right thorascopic for drainage of loculated pleural effusion on 8/13 by CT surgery, repeat esophagram on 8/16 did not show any esophageal leak.   Assessment & Plan:   Active Problems:   Essential hypertension   Atrial tachycardia (HCC)   Dyslipidemia   CAP (community acquired pneumonia)   Transaminitis   Lobar pneumonia ([place])   Sepsis due to undetermined organism ([place])   Fever   Gastroesophageal reflux disease with esophagitis   Shortness of breath   Thyroid nodule   BPH (benign prostatic hyperplasia)   Loculated pleural effusion   Ureteral stone with hydronephrosis   Pneumothorax on right   Acute respiratory failure with hypoxia (HCC)   Chronic bilateral pleural effusions   Parapneumonic effusion   Malnutrition of  moderate degree   Esophageal perforation   Pressure injury of skin   Atrial fibrillation with RVR (HCC)   Esophageal anastomotic leak   Atelectasis   Right lower lobe pulmonary infiltrate   Sinus tachycardia   Dysphagia   Respiratory distress   Status post thoracentesis   Acute respiratory failure with hypoxia Secondary to below. Resolved.  Pneumonia Parapneumonic effusion Chest tube placed and now removed. Culture significant for candida parapsilosis. Patient started on Fluconazole and completed course. Also managed on Cefepime and completed course.  Apical pneumothorax Chest tube placed and managed by CT surgery. Removed.  Sepsis Present on admission initially with recurrent sepsis physiology during hospitalization. In setting of aspiration pneumonia, empyema and mediastinitis. Patient treated empirically with Cefepime. Physiology mostly resolved except for some tachycardia. Leukocytosis slightly elevated but stabilized.  Esophageal rupture Stent placed by cardiothoracic surgery  Oropharyngeal dysphagia -SLP recommendations: thin liquid -Continue tube feeds  Acute on chronic blood loss anemia In setting of blood loss from esophageal rupture/surgery in addition to hematemesis which have resolved. Stable.  Paroxysmal atrial fibrillation Tachycardia but currently sinus rhythm -Continue metoprolol and Eliquis  Chronic sinus tachycardia No etiology found other than debility. No PE on CTA. Currently on metoprolol -Continue metoprolol  History of stroke On Lipitor as an outpatient which was held secondary to elevated LFTs  Elevated LFTs Trended down. -Repeat LFTs in AM  Left hydronephrosis Plan for shockwave lithotripsy as an outpatient.  History of stroke Chronic right-sided weakness.  Pressure injury Stage II of the right buttock  Hyponatremia Improved.   DVT prophylaxis: Eliquis Code Status:   Code Status: Full Code Family Communication: None at  bedside Disposition Plan: Discharge likely in 2 days pending bed availability and continued medical stability  Consultants:   Cardiothoracic surgery  Procedures:   VATS/DECORTICATION/PEG PLACEMENT/ESOPHAGEAL STENT PLACEMENT (05/17/2020)  RIGHT THORACOSCOPY/SOPHAGOGASTROSCOPY/ESOPHAGEAL STENT PLACEMENT/ESOPHAGRAM  Antimicrobials:  Vancomycin  Zosyn  Doxycycline  Ceftriaxone  Azithromycin  Amoxicillin    Subjective: No issues overnight  Objective: Vitals:   06/14/20 0346 06/14/20 0707 06/14/20 0900 06/14/20 1037  BP: 127/88  125/89   Pulse: (!) 124  (!) 125   Resp: (!) 26     Temp: 99.2 F (37.3 C) 97.9 F (36.6 C)  97.7 F (36.5 C)  TempSrc: Oral Oral  Oral  SpO2: 95%     Weight:      Height:        Intake/Output Summary (Last 24 hours) at 06/14/2020 1058 Last data filed at 06/14/2020 1041 Gross per 24 hour  Intake --  Output 925 ml  Net -925 ml   Filed Weights   06/11/20 0317 06/12/20 0256 06/13/20 0338  Weight: 59.2 kg 58.6 kg 59.4 kg    Examination:  General exam: Appears calm and comfortable Respiratory system: Clear to auscultation. Respiratory effort normal. Cardiovascular system: S1 & S2 heard, RRR. No murmurs, rubs, gallops or clicks. Gastrointestinal system: Abdomen is nondistended, soft and nontender. No organomegaly or masses felt. Normal bowel sounds heard. Central nervous system: Alert. No Musculoskeletal: No edema. No calf tenderness Skin: No cyanosis. No rashes Psychiatry: Judgement and insight appear normal. Mood & affect appropriate.     Data Reviewed: I have personally reviewed following labs and imaging studies  CBC Lab Results  Component Value Date   WBC 10.4 06/14/2020   RBC 3.96 (L) 06/14/2020   HGB 9.6 (L) 06/14/2020   HCT 33.0 (L) 06/14/2020   MCV 83.3 06/14/2020   MCH 24.2 (L) 06/14/2020   PLT 491 (H) 06/14/2020   MCHC 29.1 (L) 06/14/2020   RDW 15.6 (H) 06/14/2020   LYMPHSABS 0.8 06/05/2020   MONOABS 0.8  06/05/2020   EOSABS 0.1 06/05/2020   BASOSABS 0.0 62/22/9798     Last metabolic panel Lab Results  Component Value Date   NA 132 (L) 06/14/2020   K 4.5 06/14/2020   CL 95 (L) 06/14/2020   CO2 28 06/14/2020   BUN 14 06/14/2020   CREATININE 0.81 06/14/2020   GLUCOSE 139 (H) 06/14/2020   GFRNONAA >60 06/14/2020   GFRAA >60 06/14/2020   CALCIUM 9.0 06/14/2020   PHOS 3.5 05/30/2020   PROT 7.7 06/14/2020   ALBUMIN 2.3 (L) 06/14/2020   BILITOT 0.4 06/14/2020   ALKPHOS 148 (H) 06/14/2020   AST 52 (H) 06/14/2020   ALT 132 (H) 06/14/2020   ANIONGAP 9 06/14/2020    CBG (last 3)  Recent Labs    06/13/20 1600 06/13/20 2109 06/14/20 0631  GLUCAP 133* 131* 169*     GFR: Estimated Creatinine Clearance: 81.5 mL/min (by C-G formula based on SCr of 0.81 mg/dL).  Coagulation Profile: No results for input(s): INR, PROTIME in the last 168 hours.  Recent Results (from the past 240 hour(s))  Culture, blood (routine x 2)     Status: None (Preliminary result)   Collection Time: 06/10/20  1:37 PM   Specimen: BLOOD RIGHT HAND  Result Value Ref Range Status   Specimen Description BLOOD RIGHT HAND  Final   Special Requests   Final    BOTTLES DRAWN AEROBIC AND ANAEROBIC Blood Culture adequate volume   Culture   Final    NO GROWTH 4 DAYS Performed at [facility], [place] [address], [place], [place] [address]  Report Status PENDING  Incomplete  Culture, blood (routine x 2)     Status: None (Preliminary result)   Collection Time: 06/10/20  1:43 PM   Specimen: BLOOD LEFT HAND  Result Value Ref Range Status   Specimen Description BLOOD LEFT HAND  Final   Special Requests   Final    BOTTLES DRAWN AEROBIC ONLY Blood Culture results may not be optimal due to an inadequate volume of blood received in culture bottles   Culture   Final    NO GROWTH 4 DAYS Performed at [facility], [place] [address], [place], [place] [address]    Report Status PENDING  Incomplete  SARS Coronavirus  2 by RT PCR (hospital order, performed in [facility] hospital lab) Nasopharyngeal Nasopharyngeal Swab     Status: None   Collection Time: 06/11/20  1:43 PM   Specimen: Nasopharyngeal Swab  Result Value Ref Range Status   SARS Coronavirus 2 NEGATIVE NEGATIVE Final    Comment: (NOTE) SARS-CoV-2 target nucleic acids are NOT DETECTED.  The SARS-CoV-2 RNA is generally detectable in upper and lower respiratory specimens during the acute phase of infection. The lowest concentration of SARS-CoV-2 viral copies this assay can detect is 250 copies / mL. A negative result does not preclude SARS-CoV-2 infection and should not be used as the sole basis for treatment or other patient management decisions.  A negative result may occur with improper specimen collection / handling, submission of specimen other than nasopharyngeal swab, presence of viral mutation(s) within the areas targeted by this assay, and inadequate number of viral copies (<250 copies / mL). A negative result must be combined with clinical observations, patient history, and epidemiological information.  Fact Sheet for Patients:   StrictlyIdeas.no  Fact Sheet for Healthcare Providers: BankingDealers.co.za  This test is not yet approved or  cleared by the [place] FDA and has been authorized for detection and/or diagnosis of SARS-CoV-2 by FDA under an Emergency Use Authorization (EUA).  This EUA will remain in effect (meaning this test can be used) for the duration of the COVID-19 declaration under Section 564(b)(1) of the Act, 21 U.S.C. section 360bbb-3(b)(1), unless the authorization is terminated or revoked sooner.  Performed at [facility], [place] [address], [place], [place] [address]         Radiology Studies: No results found.      Scheduled Meds: . apixaban  5 mg Oral BID  . bisacodyl  10 mg Oral Daily  . feeding supplement (PROSource TF)  45 mL Per  Tube BID  . insulin aspart  0-24 Units Subcutaneous TID AC & HS  . mouth rinse  15 mL Mouth Rinse q12n4p  . metoprolol tartrate  50 mg Per Tube BID  . pantoprazole sodium  40 mg Per Tube BID  . sennosides  5 mL Per Tube QHS  . traZODone  50 mg Oral QHS   Continuous Infusions: . sodium chloride 1,000 mL (06/06/20 0028)  . sodium chloride    . feeding supplement (OSMOLITE 1.5 CAL) 1,000 mL (06/14/20 0027)     LOS: 34 days     Cordelia Poche, MD [facility] 06/14/2020, 10:58 AM  If 7PM-7AM, please contact night-coverage [email or web address]

## 2020-06-15 ENCOUNTER — Ambulatory Visit: Payer: Medicaid Other | Admitting: Gastroenterology

## 2020-06-15 LAB — CULTURE, BLOOD (ROUTINE X 2)
Culture: NO GROWTH
Culture: NO GROWTH
Special Requests: ADEQUATE

## 2020-06-15 LAB — GLUCOSE, CAPILLARY
Glucose-Capillary: 148 mg/dL — ABNORMAL HIGH (ref 70–99)
Glucose-Capillary: 136 mg/dL — ABNORMAL HIGH (ref 70–99)
Glucose-Capillary: 147 mg/dL — ABNORMAL HIGH (ref 70–99)
Glucose-Capillary: 132 mg/dL — ABNORMAL HIGH (ref 70–99)

## 2020-06-15 MED ORDER — FLUOXETINE HCL 20 MG PO CAPS
20.0000 mg | ORAL_CAPSULE | Freq: Every day | ORAL | Status: DC
  Administered 2020-06-15 – 2020-07-12 (×28): 20 mg
  Filled 2020-06-15 (×28): qty 1

## 2020-06-15 NOTE — Progress Notes (Signed)
There are still no beds available for pt. Pending offers are [facility] and [facility] in [place]. CSW has spoken with Cecille Rubin multiple times to confirm availability but there isn't a spot for him at this time. CSW will continue trying to locate placement for pt.

## 2020-06-15 NOTE — Progress Notes (Signed)
PROGRESS NOTE    Evan Chavez  PIR:[id number] DOB: 19-Oct-1960 DOA: 05/10/2020 PCP: Rosita Fire, MD   Brief Narrative: Evan Chavez is a 59 y.o. malewith medical history significant for GERD, Barrett's esophagus, peptic ulcer disease with extensive gastric erosion, duodenal ulcerand esophageal stricture status post balloon dilatation on 12/2018, also with history of prior stroke, hyperlipidemia, BPH, remote cocaine abuse and prior CVA. Patient presented secondary to weakness, productive cough, fever and dyspnea and found to have pneumonia.   Hospital course complicated by right-sided hydropneumothorax with possible lung collapse after right thoracentesis on 8/2, esophageal perforation (CT chest on 8/4)With large right-sided hydropneumothorax, complicated by aspiration pneumonia. Patient underwent right-sided chest tube placement was transferred from [facility] to [facility] where he underwent VATS and decortication by CT surgery as well as esophageal stent placement and PEG tube placement on 8/4-8/5. Patient has been managed on hospitalist service since. Did have episodes of persistent leakage from esophagus on esophagram as well as requiring right thorascopic for drainage of loculated pleural effusion on 8/13 by CT surgery, repeat esophagram on 8/16 did not show any esophageal leak.   Assessment & Plan:   Active Problems:   Essential hypertension   Atrial tachycardia (HCC)   Dyslipidemia   CAP (community acquired pneumonia)   Transaminitis   Lobar pneumonia ([place])   Sepsis due to undetermined organism ([place])   Fever   Gastroesophageal reflux disease with esophagitis   Shortness of breath   Thyroid nodule   BPH (benign prostatic hyperplasia)   Loculated pleural effusion   Ureteral stone with hydronephrosis   Pneumothorax on right   Acute respiratory failure with hypoxia (HCC)   Chronic bilateral pleural effusions   Parapneumonic effusion   Malnutrition of  moderate degree   Esophageal perforation   Pressure injury of skin   Atrial fibrillation with RVR (HCC)   Esophageal anastomotic leak   Atelectasis   Right lower lobe pulmonary infiltrate   Sinus tachycardia   Dysphagia   Respiratory distress   Status post thoracentesis   Acute respiratory failure with hypoxia Secondary to below. Resolved.  Pneumonia Parapneumonic effusion Chest tube placed and now removed. Culture significant for candida parapsilosis. Patient started on Fluconazole and completed course. Also managed on Cefepime and completed course.  Apical pneumothorax Chest tube placed and managed by CT surgery. Removed.  Sepsis Present on admission initially with recurrent sepsis physiology during hospitalization. In setting of aspiration pneumonia, empyema and mediastinitis. Patient treated empirically with Cefepime. Physiology mostly resolved except for some tachycardia. Leukocytosis slightly elevated but stabilized.  Esophageal rupture Stent placed by cardiothoracic surgery  Oropharyngeal dysphagia -SLP recommendations: thin liquid -Continue tube feeds  Acute on chronic blood loss anemia In setting of blood loss from esophageal rupture/surgery in addition to hematemesis which have resolved. Stable.  Paroxysmal atrial fibrillation Tachycardia but currently sinus rhythm -Continue metoprolol and Eliquis  Chronic sinus tachycardia No etiology found other than debility. No PE on CTA. Currently on metoprolol -Continue metoprolol  History of stroke On Lipitor as an outpatient which was held secondary to elevated LFTs  Elevated LFTs Trended down.  Left hydronephrosis Plan for shockwave lithotripsy as an outpatient.  History of stroke Chronic right-sided weakness.  Pressure injury Stage II of the right buttock  Hyponatremia Improved.  Depressed mood/flat affect Unsure if this is related to stroke vs depression. -Will start Prozac 20 mg daily per tube as  trial   DVT prophylaxis: Eliquis Code Status:   Code Status: Full Code  Family Communication: None at bedside Disposition Plan: Discharge pending bed availability. Currently, no bed availability. Medically stable for discharge once bed is available.   Consultants:   Cardiothoracic surgery  Procedures:   VATS/DECORTICATION/PEG PLACEMENT/ESOPHAGEAL STENT PLACEMENT (05/17/2020)  RIGHT THORACOSCOPY/SOPHAGOGASTROSCOPY/ESOPHAGEAL STENT PLACEMENT/ESOPHAGRAM  Antimicrobials:  Vancomycin  Zosyn  Doxycycline  Ceftriaxone  Azithromycin  Amoxicillin    Subjective: No issues  Objective: Vitals:   06/15/20 0320 06/15/20 0500 06/15/20 0600 06/15/20 0709  BP: 125/86  126/87   Pulse: (!) 114 (!) 112 (!) 116   Resp: 20 (!) 22 20   Temp: 98.7 F (37.1 C)   98.1 F (36.7 C)  TempSrc: Oral   Oral  SpO2: 98% 96% 96%   Weight:  58.6 kg    Height:        Intake/Output Summary (Last 24 hours) at 06/15/2020 1050 Last data filed at 06/15/2020 1030 Gross per 24 hour  Intake 1320 ml  Output 775 ml  Net 545 ml   Filed Weights   06/12/20 0256 06/13/20 0338 06/15/20 0500  Weight: 58.6 kg 59.4 kg 58.6 kg    Examination:  General exam: Appears calm and comfortable Respiratory system: Clear to auscultation. Respiratory effort normal. Cardiovascular system: S1 & S2 heard, RRR. No murmurs, rubs, gallops or clicks. Gastrointestinal system: Abdomen is nondistended, soft and nontender. No organomegaly or masses felt. Normal bowel sounds heard. Central nervous system: Alert. No focal neurological deficits. Musculoskeletal: No edema. No calf tenderness Skin: No cyanosis. No rashes Psychiatry: Flat affect      Data Reviewed: I have personally reviewed following labs and imaging studies  CBC Lab Results  Component Value Date   WBC 10.4 06/14/2020   RBC 3.96 (L) 06/14/2020   HGB 9.6 (L) 06/14/2020   HCT 33.0 (L) 06/14/2020   MCV 83.3 06/14/2020   MCH 24.2 (L) 06/14/2020   PLT 491  (H) 06/14/2020   MCHC 29.1 (L) 06/14/2020   RDW 15.6 (H) 06/14/2020   LYMPHSABS 0.8 06/05/2020   MONOABS 0.8 06/05/2020   EOSABS 0.1 06/05/2020   BASOSABS 0.0 41/74/0814     Last metabolic panel Lab Results  Component Value Date   NA 132 (L) 06/14/2020   K 4.5 06/14/2020   CL 95 (L) 06/14/2020   CO2 28 06/14/2020   BUN 14 06/14/2020   CREATININE 0.81 06/14/2020   GLUCOSE 139 (H) 06/14/2020   GFRNONAA >60 06/14/2020   GFRAA >60 06/14/2020   CALCIUM 9.0 06/14/2020   PHOS 3.5 05/30/2020   PROT 7.7 06/14/2020   ALBUMIN 2.3 (L) 06/14/2020   BILITOT 0.4 06/14/2020   ALKPHOS 148 (H) 06/14/2020   AST 52 (H) 06/14/2020   ALT 132 (H) 06/14/2020   ANIONGAP 9 06/14/2020    CBG (last 3)  Recent Labs    06/14/20 1629 06/14/20 2100 06/15/20 0626  GLUCAP 152* 137* 148*     GFR: Estimated Creatinine Clearance: 80.4 mL/min (by C-G formula based on SCr of 0.81 mg/dL).  Coagulation Profile: No results for input(s): INR, PROTIME in the last 168 hours.  Recent Results (from the past 240 hour(s))  Culture, blood (routine x 2)     Status: None   Collection Time: 06/10/20  1:37 PM   Specimen: BLOOD RIGHT HAND  Result Value Ref Range Status   Specimen Description BLOOD RIGHT HAND  Final   Special Requests   Final    BOTTLES DRAWN AEROBIC AND ANAEROBIC Blood Culture adequate volume   Culture   Final  NO GROWTH 5 DAYS Performed at [facility], [place] [address], [place], [place] [address]    Report Status 06/15/2020 FINAL  Final  Culture, blood (routine x 2)     Status: None   Collection Time: 06/10/20  1:43 PM   Specimen: BLOOD LEFT HAND  Result Value Ref Range Status   Specimen Description BLOOD LEFT HAND  Final   Special Requests   Final    BOTTLES DRAWN AEROBIC ONLY Blood Culture results may not be optimal due to an inadequate volume of blood received in culture bottles   Culture   Final    NO GROWTH 5 DAYS Performed at [facility], [place] [address],  [place], [place] [address]    Report Status 06/15/2020 FINAL  Final  SARS Coronavirus 2 by RT PCR (hospital order, performed in [facility] hospital lab) Nasopharyngeal Nasopharyngeal Swab     Status: None   Collection Time: 06/11/20  1:43 PM   Specimen: Nasopharyngeal Swab  Result Value Ref Range Status   SARS Coronavirus 2 NEGATIVE NEGATIVE Final    Comment: (NOTE) SARS-CoV-2 target nucleic acids are NOT DETECTED.  The SARS-CoV-2 RNA is generally detectable in upper and lower respiratory specimens during the acute phase of infection. The lowest concentration of SARS-CoV-2 viral copies this assay can detect is 250 copies / mL. A negative result does not preclude SARS-CoV-2 infection and should not be used as the sole basis for treatment or other patient management decisions.  A negative result may occur with improper specimen collection / handling, submission of specimen other than nasopharyngeal swab, presence of viral mutation(s) within the areas targeted by this assay, and inadequate number of viral copies (<250 copies / mL). A negative result must be combined with clinical observations, patient history, and epidemiological information.  Fact Sheet for Patients:   StrictlyIdeas.no  Fact Sheet for Healthcare Providers: BankingDealers.co.za  This test is not yet approved or  cleared by the [place] FDA and has been authorized for detection and/or diagnosis of SARS-CoV-2 by FDA under an Emergency Use Authorization (EUA).  This EUA will remain in effect (meaning this test can be used) for the duration of the COVID-19 declaration under Section 564(b)(1) of the Act, 21 U.S.C. section 360bbb-3(b)(1), unless the authorization is terminated or revoked sooner.  Performed at [facility], [place] [address], [place],  [address]         Radiology Studies: No results found.      Scheduled Meds: . apixaban  5 mg Oral BID   . bisacodyl  10 mg Oral Daily  . feeding supplement (PROSource TF)  45 mL Per Tube BID  . FLUoxetine  20 mg Per Tube Daily  . insulin aspart  0-24 Units Subcutaneous TID AC & HS  . mouth rinse  15 mL Mouth Rinse q12n4p  . metoprolol tartrate  75 mg Per Tube BID  . pantoprazole sodium  40 mg Per Tube BID  . sennosides  5 mL Per Tube QHS  . traZODone  50 mg Oral QHS   Continuous Infusions: . sodium chloride 1,000 mL (06/06/20 0028)  . sodium chloride    . feeding supplement (OSMOLITE 1.5 CAL) 60 mL/hr at 06/15/20 0600     LOS: 35 days     Cordelia Poche, MD [facility] 06/15/2020, 10:50 AM  If 7PM-7AM, please contact night-coverage [email or web address]

## 2020-06-15 NOTE — Progress Notes (Signed)
Physical Therapy Treatment Patient Details Name: Evan Chavez MRN: [id number] DOB: 1961/06/12 Today's Date: 06/15/2020    History of Present Illness Pt is 59 yo male presents to the hospital on 7/28 with generalized weakness, productive cough and low-grade fever. Pt is currently being treated for hydropneumothorax, Sepsis aspiration pneumonia and esophagal rupture, AKI and acute delirium. s/p R thoracentesis on 8/2. Chest tube placed 8/2-8/9. s/p VATS, decortication, EGD, PEG tube placement, and a esophageal stent on 8/9. PMH of GERD, Barrett's esophagus, PUD with extensive gastric erosion, duodenal ulcer and esophageal stricture, stroke, HLD, BPH, cocaine abuse. s/p Right thoracoscopy, chest tube placement, esophageal stent placement 8/13-8/20.    PT Comments    Pt making gradual progress.  He had met some goals and progressing towards others - goals were updated to reflect progress. Pt with flat affect and no verbalization today but was able to follow simple commands with increased time.  He ambulated 31' with RW, min A, and cues for posture and RW use.  Pt's HR 117-137 bpm during treatment.  Cont POC.     Follow Up Recommendations  SNF     Equipment Recommendations  3in1 (PT);Rolling walker with 5" wheels    Recommendations for Other Services       Precautions / Restrictions Precautions Precautions: Fall Precaution Comments: PEG, right weakness    Mobility  Bed Mobility               General bed mobility comments: in chair at arrival  Transfers Overall transfer level: Needs assistance Equipment used: Rolling walker (2 wheeled) Transfers: Sit to/from Stand Sit to Stand: Min assist         General transfer comment: cues for hand placement with assist to rise  Ambulation/Gait Ambulation/Gait assistance: Min assist Gait Distance (Feet): 65 Feet Assistive device: Rolling walker (2 wheeled) Gait Pattern/deviations: Step-to pattern;Trunk flexed;Narrow base of  support Gait velocity: decr   General Gait Details: Pt with tendency to keep body partially rotated in walker with partial side stepping to progress gait with L side forward. Flexed trunk with mod cues to stand straight and  step into RW.  Cues for increased step length on R.   Stairs             Wheelchair Mobility    Modified Rankin (Stroke Patients Only)       Balance Overall balance assessment: Needs assistance Sitting-balance support: Feet supported;No upper extremity supported Sitting balance-Leahy Scale: Fair Sitting balance - Comments: static sitting without assist   Standing balance support: During functional activity Standing balance-Leahy Scale: Poor Standing balance comment: walker for static standing                            Cognition Arousal/Alertness: Awake/alert Behavior During Therapy: Flat affect Overall Cognitive Status: Impaired/Different from baseline Area of Impairment: Following commands;Problem solving                       Following Commands: Follows one step commands consistently;Follows one step commands with increased time     Problem Solving: Slow processing;Decreased initiation;Requires verbal cues General Comments: Pt with no vebalization but did nod yes/no appropriately and consistently      Exercises      General Comments General comments (skin integrity, edema, etc.): Pt on RA with stable O2 sats.  HR 117 bpm rest and max 137 bpm walking      Pertinent Vitals/Pain Pain  Assessment: No/denies pain    Home Living                      Prior Function            PT Goals (current goals can now be found in the care plan section) Acute Rehab PT Goals Patient Stated Goal: get better PT Goal Formulation: With patient Time For Goal Achievement: 06/29/20 Potential to Achieve Goals: Good Progress towards PT goals: Progressing toward goals;Goals met and updated - see care plan    Frequency     Min 2X/week      PT Plan Current plan remains appropriate    Co-evaluation              AM-PAC PT "6 Clicks" Mobility   Outcome Measure  Help needed turning from your back to your side while in a flat bed without using bedrails?: A Little Help needed moving from lying on your back to sitting on the side of a flat bed without using bedrails?: A Little Help needed moving to and from a bed to a chair (including a wheelchair)?: A Little Help needed standing up from a chair using your arms (e.g., wheelchair or bedside chair)?: A Little Help needed to walk in hospital room?: A Little Help needed climbing 3-5 steps with a railing? : A Lot 6 Click Score: 17    End of Session Equipment Utilized During Treatment: Gait belt Activity Tolerance: Patient tolerated treatment well Patient left: in chair;with call bell/phone within reach;with chair alarm set Nurse Communication: Mobility status PT Visit Diagnosis: Unsteadiness on feet (R26.81);Muscle weakness (generalized) (M62.81)     Time: 4503-8882 PT Time Calculation (min) (ACUTE ONLY): 20 min  Charges:  $Gait Training: 8-22 mins                     Abran Richard, PT Acute Rehab Services Pager [phone] [name] [place] 06/15/2020, 5:50 PM

## 2020-06-16 LAB — GLUCOSE, CAPILLARY
Glucose-Capillary: 148 mg/dL — ABNORMAL HIGH (ref 70–99)
Glucose-Capillary: 111 mg/dL — ABNORMAL HIGH (ref 70–99)
Glucose-Capillary: 155 mg/dL — ABNORMAL HIGH (ref 70–99)
Glucose-Capillary: 112 mg/dL — ABNORMAL HIGH (ref 70–99)

## 2020-06-16 NOTE — Progress Notes (Signed)
PROGRESS NOTE    Evan Chavez  OVF:[id number] DOB: [birth year] DOA: 05/10/2020 PCP: Rosita Fire, MD   Brief Narrative: Evan Chavez is a 59 y.o. malewith medical history significant for GERD, Barrett's esophagus, peptic ulcer disease with extensive gastric erosion, duodenal ulcerand esophageal stricture status post balloon dilatation on 12/2018, also with history of prior stroke, hyperlipidemia, BPH, remote cocaine abuse and prior CVA. Patient presented secondary to weakness, productive cough, fever and dyspnea and found to have pneumonia.   Hospital course complicated by right-sided hydropneumothorax with possible lung collapse after right thoracentesis on 8/2, esophageal perforation (CT chest on 8/4)With large right-sided hydropneumothorax, complicated by aspiration pneumonia. Patient underwent right-sided chest tube placement was transferred [facility] to [facility] where he underwent VATS and decortication by CT surgery as well as esophageal stent placement and PEG tube placement on 8/4-8/5. Patient has been managed on hospitalist service since. Did have episodes of persistent leakage from esophagus on esophagram as well as requiring right thorascopic for drainage of loculated pleural effusion on 8/13 by CT surgery, repeat esophagram on 8/16 did not show any esophageal leak.   Assessment & Plan:   Active Problems:   Essential hypertension   Atrial tachycardia (HCC)   Dyslipidemia   CAP (community acquired pneumonia)   Transaminitis   Lobar pneumonia ([place])   Sepsis due to undetermined organism ([place])   Fever   Gastroesophageal reflux disease with esophagitis   Shortness of breath   Thyroid nodule   BPH (benign prostatic hyperplasia)   Loculated pleural effusion   Ureteral stone with hydronephrosis   Pneumothorax on right   Acute respiratory failure with hypoxia (HCC)   Chronic bilateral pleural effusions   Parapneumonic effusion   Malnutrition of  moderate degree   Esophageal perforation   Pressure injury of skin   Atrial fibrillation with RVR (HCC)   Esophageal anastomotic leak   Atelectasis   Right lower lobe pulmonary infiltrate   Sinus tachycardia   Dysphagia   Respiratory distress   Status post thoracentesis   Acute respiratory failure with hypoxia Secondary to below. Resolved.  Pneumonia Parapneumonic effusion Chest tube placed and now removed. Culture significant for candida parapsilosis. Patient started on Fluconazole and completed course. Also managed on Cefepime and completed course.  Apical pneumothorax Chest tube placed and managed by CT surgery. Removed.  Sepsis Present on admission initially with recurrent sepsis physiology during hospitalization. In setting of aspiration pneumonia, empyema and mediastinitis. Patient treated empirically with Cefepime. Physiology mostly resolved except for some tachycardia. Leukocytosis slightly elevated but stabilized.  Esophageal rupture Stent placed by cardiothoracic surgery  Oropharyngeal dysphagia -SLP recommendations: thin liquid -Continue tube feeds  Acute on chronic blood loss anemia In setting of blood loss from esophageal rupture/surgery in addition to hematemesis which have resolved. Stable.  Paroxysmal atrial fibrillation Tachycardia but currently sinus rhythm -Continue metoprolol and Eliquis  Chronic sinus tachycardia No etiology found other than debility. No PE on CTA. Currently on metoprolol. Improved this morning with HR in 90s -Continue metoprolol  History of stroke On Lipitor as an outpatient which was held secondary to elevated LFTs  Elevated LFTs Trended down.  Left hydronephrosis Plan for shockwave lithotripsy as an outpatient.  History of stroke Chronic right-sided weakness.  Pressure injury Stage II of the right buttock  Hyponatremia Improved.  Depressed mood/flat affect Unsure if this is related to stroke vs depression. Patient  states he has a history of depression and that he has been on Prozac in  the past which has been helpful. -Continue Prozac 20 mg daily per tube   DVT prophylaxis: Eliquis Code Status:   Code Status: Full Code Family Communication: None at bedside Disposition Plan: Discharge pending bed availability. Currently, no bed availability. Medically stable for discharge once bed is available.   Consultants:   Cardiothoracic surgery  Procedures:   VATS/DECORTICATION/PEG PLACEMENT/ESOPHAGEAL STENT PLACEMENT (05/17/2020)  RIGHT THORACOSCOPY/SOPHAGOGASTROSCOPY/ESOPHAGEAL STENT PLACEMENT/ESOPHAGRAM  Antimicrobials:  Vancomycin  Zosyn  Doxycycline  Ceftriaxone  Azithromycin  Amoxicillin    Subjective: No concerns today. Sitting up in the chair.  Objective: Vitals:   06/16/20 0400 06/16/20 0500 06/16/20 0600 06/16/20 0758  BP: (!) 126/96  123/82 (!) 127/92  Pulse: (!) 112 (!) 129 (!) 119 (!) 123  Resp: (!) 22 (!) 27 20 20   Temp: 98.4 F (36.9 C)   98.3 F (36.8 C)  TempSrc: Oral   Oral  SpO2: 97% 95% 97% 95%  Weight:  57.6 kg    Height:        Intake/Output Summary (Last 24 hours) at 06/16/2020 0918 Last data filed at 06/16/2020 4315 Gross per 24 hour  Intake 1320 ml  Output 850 ml  Net 470 ml   Filed Weights   06/13/20 0338 06/15/20 0500 06/16/20 0500  Weight: 59.4 kg 58.6 kg 57.6 kg    Examination:  General exam: Appears calm and comfortable Respiratory system: Clear to auscultation. Respiratory effort normal. Cardiovascular system: S1 & S2 heard, RRR. No murmurs, rubs, gallops or clicks. Gastrointestinal system: Abdomen is nondistended, soft and nontender. No organomegaly or masses felt. Normal bowel sounds heard. Central nervous system: Alert. Musculoskeletal: No edema. No calf tenderness Skin: No cyanosis. No rashes Psychiatry: Judgement and insight appear normal. Flat affect.      Data Reviewed: I have personally reviewed following labs and imaging  studies  CBC Lab Results  Component Value Date   WBC 10.4 06/14/2020   RBC 3.96 (L) 06/14/2020   HGB 9.6 (L) 06/14/2020   HCT 33.0 (L) 06/14/2020   MCV 83.3 06/14/2020   MCH 24.2 (L) 06/14/2020   PLT 491 (H) 06/14/2020   MCHC 29.1 (L) 06/14/2020   RDW 15.6 (H) 06/14/2020   LYMPHSABS 0.8 06/05/2020   MONOABS 0.8 06/05/2020   EOSABS 0.1 06/05/2020   BASOSABS 0.0 40/05/6760     Last metabolic panel Lab Results  Component Value Date   NA 132 (L) 06/14/2020   K 4.5 06/14/2020   CL 95 (L) 06/14/2020   CO2 28 06/14/2020   BUN 14 06/14/2020   CREATININE 0.81 06/14/2020   GLUCOSE 139 (H) 06/14/2020   GFRNONAA >60 06/14/2020   GFRAA >60 06/14/2020   CALCIUM 9.0 06/14/2020   PHOS 3.5 05/30/2020   PROT 7.7 06/14/2020   ALBUMIN 2.3 (L) 06/14/2020   BILITOT 0.4 06/14/2020   ALKPHOS 148 (H) 06/14/2020   AST 52 (H) 06/14/2020   ALT 132 (H) 06/14/2020   ANIONGAP 9 06/14/2020    CBG (last 3)  Recent Labs    06/15/20 1603 06/15/20 2152 06/16/20 0614  GLUCAP 147* 132* 148*     GFR: Estimated Creatinine Clearance: 79 mL/min (by C-G formula based on SCr of 0.81 mg/dL).  Coagulation Profile: No results for input(s): INR, PROTIME in the last 168 hours.  Recent Results (from the past 240 hour(s))  Culture, blood (routine x 2)     Status: None   Collection Time: 06/10/20  1:37 PM   Specimen: BLOOD RIGHT HAND  Result Value Ref Range Status  Specimen Description BLOOD RIGHT HAND  Final   Special Requests   Final    BOTTLES DRAWN AEROBIC AND ANAEROBIC Blood Culture adequate volume   Culture   Final    NO GROWTH 5 DAYS Performed at [facility], [address] [place], [place] [address]    Report Status 06/15/2020 FINAL  Final  Culture, blood (routine x 2)     Status: None   Collection Time: 06/10/20  1:43 PM   Specimen: BLOOD LEFT HAND  Result Value Ref Range Status   Specimen Description BLOOD LEFT HAND  Final   Special Requests   Final    BOTTLES DRAWN  AEROBIC ONLY Blood Culture results may not be optimal due to an inadequate volume of blood received in culture bottles   Culture   Final    NO GROWTH 5 DAYS Performed at [facility], [address] [place], [place] [address]    Report Status 06/15/2020 FINAL  Final  SARS Coronavirus 2 by RT PCR (hospital order, performed in [facility] hospital lab) Nasopharyngeal Nasopharyngeal Swab     Status: None   Collection Time: 06/11/20  1:43 PM   Specimen: Nasopharyngeal Swab  Result Value Ref Range Status   SARS Coronavirus 2 NEGATIVE NEGATIVE Final    Comment: (NOTE) SARS-CoV-2 target nucleic acids are NOT DETECTED.  The SARS-CoV-2 RNA is generally detectable in upper and lower respiratory specimens during the acute phase of infection. The lowest concentration of SARS-CoV-2 viral copies this assay can detect is 250 copies / mL. A negative result does not preclude SARS-CoV-2 infection and should not be used as the sole basis for treatment or other patient management decisions.  A negative result may occur with improper specimen collection / handling, submission of specimen other than nasopharyngeal swab, presence of viral mutation(s) within the areas targeted by this assay, and inadequate number of viral copies (<250 copies / mL). A negative result must be combined with clinical observations, patient history, and epidemiological information.  Fact Sheet for Patients:   StrictlyIdeas.no  Fact Sheet for Healthcare Providers: BankingDealers.co.za  This test is not yet approved or  cleared by the [place] FDA and has been authorized for detection and/or diagnosis of SARS-CoV-2 by FDA under an Emergency Use Authorization (EUA).  This EUA will remain in effect (meaning this test can be used) for the duration of the COVID-19 declaration under Section 564(b)(1) of the Act, 21 U.S.C. section 360bbb-3(b)(1), unless the authorization is  terminated or revoked sooner.  Performed at [facility], [place] [address] [place], [place] [address]         Radiology Studies: No results found.      Scheduled Meds: . apixaban  5 mg Oral BID  . bisacodyl  10 mg Oral Daily  . feeding supplement (PROSource TF)  45 mL Per Tube BID  . FLUoxetine  20 mg Per Tube Daily  . insulin aspart  0-24 Units Subcutaneous TID AC & HS  . mouth rinse  15 mL Mouth Rinse q12n4p  . metoprolol tartrate  75 mg Per Tube BID  . pantoprazole sodium  40 mg Per Tube BID  . sennosides  5 mL Per Tube QHS  . traZODone  50 mg Oral QHS   Continuous Infusions: . sodium chloride 1,000 mL (06/06/20 0028)  . sodium chloride    . feeding supplement (OSMOLITE 1.5 CAL) 60 mL/hr at 06/16/20 0500     LOS: 36 days  Cordelia Poche, MD [facility] 06/16/2020, 9:18 AM  If 7PM-7AM, please contact night-coverage [email or web address]

## 2020-06-17 LAB — COMPREHENSIVE METABOLIC PANEL
ALT: 154 U/L — ABNORMAL HIGH (ref 0–44)
AST: 55 U/L — ABNORMAL HIGH (ref 15–41)
Albumin: 2.1 g/dL — ABNORMAL LOW (ref 3.5–5.0)
Alkaline Phosphatase: 142 U/L — ABNORMAL HIGH (ref 38–126)
Anion gap: 10 (ref 5–15)
BUN: 12 mg/dL (ref 6–20)
CO2: 25 mmol/L (ref 22–32)
Calcium: 8.5 mg/dL — ABNORMAL LOW (ref 8.9–10.3)
Chloride: 95 mmol/L — ABNORMAL LOW (ref 98–111)
Creatinine, Ser: 0.69 mg/dL (ref 0.61–1.24)
GFR calc Af Amer: >60 mL/min (ref >60)
GFR calc non Af Amer: >60 mL/min (ref >60)
Glucose, Bld: 145 mg/dL — ABNORMAL HIGH (ref 70–99)
Potassium: 4.3 mmol/L (ref 3.5–5.1)
Sodium: 130 mmol/L — ABNORMAL LOW (ref 135–145)
Total Bilirubin: 0.5 mg/dL (ref 0.3–1.2)
Total Protein: 7.5 g/dL (ref 6.5–8.1)

## 2020-06-17 LAB — CBC
HCT: 30.3 % — ABNORMAL LOW (ref 39.0–52.0)
Hemoglobin: 8.9 g/dL — ABNORMAL LOW (ref 13.0–17.0)
MCH: 24.1 pg — ABNORMAL LOW (ref 26.0–34.0)
MCHC: 29.4 g/dL — ABNORMAL LOW (ref 30.0–36.0)
MCV: 81.9 fL (ref 80.0–100.0)
Platelets: 411 10*3/uL — ABNORMAL HIGH (ref 150–400)
RBC: 3.7 MIL/uL — ABNORMAL LOW (ref 4.22–5.81)
RDW: 15.7 % — ABNORMAL HIGH (ref 11.5–15.5)
WBC: 12.3 10*3/uL — ABNORMAL HIGH (ref 4.0–10.5)
nRBC: 0 % (ref 0.0–0.2)

## 2020-06-17 LAB — GLUCOSE, CAPILLARY
Glucose-Capillary: 149 mg/dL — ABNORMAL HIGH (ref 70–99)
Glucose-Capillary: 143 mg/dL — ABNORMAL HIGH (ref 70–99)
Glucose-Capillary: 146 mg/dL — ABNORMAL HIGH (ref 70–99)
Glucose-Capillary: 148 mg/dL — ABNORMAL HIGH (ref 70–99)

## 2020-06-17 NOTE — Progress Notes (Signed)
PROGRESS NOTE    Evan Chavez  YBO:[id number] DOB: 1961-02-10 DOA: 05/10/2020 PCP: Rosita Fire, MD   Brief Narrative: Evan Chavez is a 59 y.o. malewith medical history significant for GERD, Barrett's esophagus, peptic ulcer disease with extensive gastric erosion, duodenal ulcerand esophageal stricture status post balloon dilatation on 12/2018, also with history of prior stroke, hyperlipidemia, BPH, remote cocaine abuse and prior CVA. Patient presented secondary to weakness, productive cough, fever and dyspnea and found to have pneumonia.   Hospital course complicated by right-sided hydropneumothorax with possible lung collapse after right thoracentesis on 8/2, esophageal perforation (CT chest on 8/4)With large right-sided hydropneumothorax, complicated by aspiration pneumonia. Patient underwent right-sided chest tube placement was transferred from [facility] to [facility] where he underwent VATS and decortication by CT surgery as well as esophageal stent placement and PEG tube placement on 8/4-8/5. Patient has been managed on hospitalist service since. Did have episodes of persistent leakage from esophagus on esophagram as well as requiring right thorascopic for drainage of loculated pleural effusion on 8/13 by CT surgery, repeat esophagram on 8/16 did not show any esophageal leak.   Assessment & Plan:   Active Problems:   Essential hypertension   Atrial tachycardia (HCC)   Dyslipidemia   CAP (community acquired pneumonia)   Transaminitis   Lobar pneumonia ([place])   Sepsis due to undetermined organism ([place])   Fever   Gastroesophageal reflux disease with esophagitis   Shortness of breath   Thyroid nodule   BPH (benign prostatic hyperplasia)   Loculated pleural effusion   Ureteral stone with hydronephrosis   Pneumothorax on right   Acute respiratory failure with hypoxia (HCC)   Chronic bilateral pleural effusions   Parapneumonic effusion   Malnutrition of  moderate degree   Esophageal perforation   Pressure injury of skin   Atrial fibrillation with RVR (HCC)   Esophageal anastomotic leak   Atelectasis   Right lower lobe pulmonary infiltrate   Sinus tachycardia   Dysphagia   Respiratory distress   Status post thoracentesis   Acute respiratory failure with hypoxia Secondary to below. Resolved.  Pneumonia Parapneumonic effusion Chest tube placed initially for management and now removed. Pleural fluid culture significant for candida parapsilosis. Patient started on Fluconazole and completed course. Also managed on Cefepime and completed course.  Apical pneumothorax Chest tube placed and managed by CT surgery. Removed.  Sepsis Present on admission initially with recurrent sepsis physiology during hospitalization. In setting of aspiration pneumonia, empyema and mediastinitis. Patient treated empirically with Cefepime. Physiology mostly resolved except for some tachycardia. Leukocytosis slightly elevated but stabilized.  Esophageal rupture Stent placed by cardiothoracic surgery  Oropharyngeal dysphagia -SLP recommendations: thin liquid -Continue tube feeds  Acute on chronic blood loss anemia In setting of blood loss from esophageal rupture/surgery in addition to hematemesis which have resolved. Stable.  Paroxysmal atrial fibrillation Tachycardia but currently sinus rhythm -Continue metoprolol and Eliquis  Chronic sinus tachycardia No etiology found other than debility. No PE on CTA. Currently on metoprolol. Improved this morning with HR in 90s -Continue metoprolol 75 mg BID  History of stroke On Lipitor as an outpatient which was held secondary to elevated LFTs  Elevated LFTs Trended down.  Left hydronephrosis Plan for shockwave lithotripsy as an outpatient.  History of stroke Chronic right-sided weakness.  Pressure injury Stage II of the right buttock  Hyponatremia Improved.  Depressed mood/flat affect Unsure if  this is related to stroke vs depression. Patient states he has a history of depression  and that he has been on Prozac in the past which has been helpful. -Continue Prozac 20 mg daily per tube   DVT prophylaxis: Eliquis Code Status:   Code Status: Full Code Family Communication: None at bedside Disposition Plan: Discharge pending bed availability. Currently, no bed availability. Medically stable for discharge once bed is available.   Consultants:   Cardiothoracic surgery  Procedures:   VATS/DECORTICATION/PEG PLACEMENT/ESOPHAGEAL STENT PLACEMENT (05/17/2020)  RIGHT THORACOSCOPY/SOPHAGOGASTROSCOPY/ESOPHAGEAL STENT PLACEMENT/ESOPHAGRAM  Antimicrobials:  Vancomycin  Zosyn  Doxycycline  Ceftriaxone  Azithromycin  Amoxicillin    Subjective: No issues today.  Objective: Vitals:   06/17/20 0200 06/17/20 0400 06/17/20 0500 06/17/20 0745  BP: 109/71 (!) 139/91  (!) 135/96  Pulse:    (!) 122  Resp: (!) 28 20  (!) 22  Temp:    98 F (36.7 C)  TempSrc:    Oral  SpO2:      Weight:   57.2 kg   Height:        Intake/Output Summary (Last 24 hours) at 06/17/2020 0905 Last data filed at 06/17/2020 0745 Gross per 24 hour  Intake 1530 ml  Output 950 ml  Net 580 ml   Filed Weights   06/15/20 0500 06/16/20 0500 06/17/20 0500  Weight: 58.6 kg 57.6 kg 57.2 kg    Examination:  General exam: Appears calm and comfortable Respiratory system: Clear to auscultation. Respiratory effort normal. Cardiovascular system: S1 & S2 heard, tachycardia, normal rhythm. No murmurs, rubs, gallops or clicks. Gastrointestinal system: Abdomen is nondistended, soft and nontender. No organomegaly or masses felt. Normal bowel sounds heard. Central nervous system: Alert and oriented. No focal neurological deficits. Musculoskeletal: No edema. No calf tenderness Skin: No cyanosis. No rashes Psychiatry: Judgement and insight appear normal. Flat affect.       Data Reviewed: I have personally reviewed  following labs and imaging studies  CBC Lab Results  Component Value Date   WBC 10.4 06/14/2020   RBC 3.96 (L) 06/14/2020   HGB 9.6 (L) 06/14/2020   HCT 33.0 (L) 06/14/2020   MCV 83.3 06/14/2020   MCH 24.2 (L) 06/14/2020   PLT 491 (H) 06/14/2020   MCHC 29.1 (L) 06/14/2020   RDW 15.6 (H) 06/14/2020   LYMPHSABS 0.8 06/05/2020   MONOABS 0.8 06/05/2020   EOSABS 0.1 06/05/2020   BASOSABS 0.0 93/71/6967     Last metabolic panel Lab Results  Component Value Date   NA 132 (L) 06/14/2020   K 4.5 06/14/2020   CL 95 (L) 06/14/2020   CO2 28 06/14/2020   BUN 14 06/14/2020   CREATININE 0.81 06/14/2020   GLUCOSE 139 (H) 06/14/2020   GFRNONAA >60 06/14/2020   GFRAA >60 06/14/2020   CALCIUM 9.0 06/14/2020   PHOS 3.5 05/30/2020   PROT 7.7 06/14/2020   ALBUMIN 2.3 (L) 06/14/2020   BILITOT 0.4 06/14/2020   ALKPHOS 148 (H) 06/14/2020   AST 52 (H) 06/14/2020   ALT 132 (H) 06/14/2020   ANIONGAP 9 06/14/2020    CBG (last 3)  Recent Labs    06/16/20 1605 06/16/20 2107 06/17/20 0603  GLUCAP 155* 112* 149*     GFR: Estimated Creatinine Clearance: 78.5 mL/min (by C-G formula based on SCr of 0.81 mg/dL).  Coagulation Profile: No results for input(s): INR, PROTIME in the last 168 hours.  Recent Results (from the past 240 hour(s))  Culture, blood (routine x 2)     Status: None   Collection Time: 06/10/20  1:37 PM   Specimen: BLOOD RIGHT HAND  Result Value Ref Range Status   Specimen Description BLOOD RIGHT HAND  Final   Special Requests   Final    BOTTLES DRAWN AEROBIC AND ANAEROBIC Blood Culture adequate volume   Culture   Final    NO GROWTH 5 DAYS Performed at [facility], [address] [place], [place] [address]    Report Status 06/15/2020 FINAL  Final  Culture, blood (routine x 2)     Status: None   Collection Time: 06/10/20  1:43 PM   Specimen: BLOOD LEFT HAND  Result Value Ref Range Status   Specimen Description BLOOD LEFT HAND  Final   Special Requests    Final    BOTTLES DRAWN AEROBIC ONLY Blood Culture results may not be optimal due to an inadequate volume of blood received in culture bottles   Culture   Final    NO GROWTH 5 DAYS Performed at [facility], [place] [address], [place], [place] [address]    Report Status 06/15/2020 FINAL  Final  SARS Coronavirus 2 by RT PCR (hospital order, performed in [facility] hospital lab) Nasopharyngeal Nasopharyngeal Swab     Status: None   Collection Time: 06/11/20  1:43 PM   Specimen: Nasopharyngeal Swab  Result Value Ref Range Status   SARS Coronavirus 2 NEGATIVE NEGATIVE Final    Comment: (NOTE) SARS-CoV-2 target nucleic acids are NOT DETECTED.  The SARS-CoV-2 RNA is generally detectable in upper and lower respiratory specimens during the acute phase of infection. The lowest concentration of SARS-CoV-2 viral copies this assay can detect is 250 copies / mL. A negative result does not preclude SARS-CoV-2 infection and should not be used as the sole basis for treatment or other patient management decisions.  A negative result may occur with improper specimen collection / handling, submission of specimen other than nasopharyngeal swab, presence of viral mutation(s) within the areas targeted by this assay, and inadequate number of viral copies (<250 copies / mL). A negative result must be combined with clinical observations, patient history, and epidemiological information.  Fact Sheet for Patients:   StrictlyIdeas.no  Fact Sheet for Healthcare Providers: BankingDealers.co.za  This test is not yet approved or  cleared by the [place] FDA and has been authorized for detection and/or diagnosis of SARS-CoV-2 by FDA under an Emergency Use Authorization (EUA).  This EUA will remain in effect (meaning this test can be used) for the duration of the COVID-19 declaration under Section 564(b)(1) of the Act, 21 U.S.C. section 360bbb-3(b)(1), unless  the authorization is terminated or revoked sooner.  Performed at [facility], [place] [address] [place],  [address]         Radiology Studies: No results found.      Scheduled Meds: . apixaban  5 mg Oral BID  . bisacodyl  10 mg Oral Daily  . feeding supplement (PROSource TF)  45 mL Per Tube BID  . FLUoxetine  20 mg Per Tube Daily  . insulin aspart  0-24 Units Subcutaneous TID AC & HS  . mouth rinse  15 mL Mouth Rinse q12n4p  . metoprolol tartrate  75 mg Per Tube BID  . pantoprazole sodium  40 mg Per Tube BID  . sennosides  5 mL Per Tube QHS  . traZODone  50 mg Oral QHS   Continuous Infusions: . sodium chloride 1,000 mL (06/06/20 0028)  . sodium chloride    . feeding supplement (OSMOLITE 1.5 CAL) 1,000 mL (06/17/20 0512)  LOS: 37 days     Cordelia Poche, MD [facility] 06/17/2020, 9:05 AM  If 7PM-7AM, please contact night-coverage [email or web address]

## 2020-06-18 ENCOUNTER — Inpatient Hospital Stay (HOSPITAL_COMMUNITY): Payer: Medicaid Other

## 2020-06-18 LAB — CBC
HCT: 31.7 % — ABNORMAL LOW (ref 39.0–52.0)
Hemoglobin: 9.4 g/dL — ABNORMAL LOW (ref 13.0–17.0)
MCH: 24.4 pg — ABNORMAL LOW (ref 26.0–34.0)
MCHC: 29.7 g/dL — ABNORMAL LOW (ref 30.0–36.0)
MCV: 82.1 fL (ref 80.0–100.0)
Platelets: 405 10*3/uL — ABNORMAL HIGH (ref 150–400)
RBC: 3.86 MIL/uL — ABNORMAL LOW (ref 4.22–5.81)
RDW: 15.8 % — ABNORMAL HIGH (ref 11.5–15.5)
WBC: 11 10*3/uL — ABNORMAL HIGH (ref 4.0–10.5)
nRBC: 0 % (ref 0.0–0.2)

## 2020-06-18 LAB — GLUCOSE, CAPILLARY
Glucose-Capillary: 166 mg/dL — ABNORMAL HIGH (ref 70–99)
Glucose-Capillary: 136 mg/dL — ABNORMAL HIGH (ref 70–99)
Glucose-Capillary: 114 mg/dL — ABNORMAL HIGH (ref 70–99)
Glucose-Capillary: 133 mg/dL — ABNORMAL HIGH (ref 70–99)

## 2020-06-18 NOTE — Plan of Care (Signed)

## 2020-06-18 NOTE — Progress Notes (Signed)
PROGRESS NOTE    Evan Chavez  ZDG:[id number] DOB: 1961-08-09 DOA: 05/10/2020 PCP: Rosita Fire, MD   Brief Narrative: Evan Chavez is a 59 y.o. malewith medical history significant for GERD, Barrett's esophagus, peptic ulcer disease with extensive gastric erosion, duodenal ulcerand esophageal stricture status post balloon dilatation on 12/2018, also with history of prior stroke, hyperlipidemia, BPH, remote cocaine abuse and prior CVA. Patient presented secondary to weakness, productive cough, fever and dyspnea and found to have pneumonia.   Hospital course complicated by right-sided hydropneumothorax with possible lung collapse after right thoracentesis on 8/2, esophageal perforation (CT chest on 8/4)With large right-sided hydropneumothorax, complicated by aspiration pneumonia. Patient underwent right-sided chest tube placement was [facility] from [facility] to [facility] where he underwent VATS and decortication by CT surgery as well as esophageal stent placement and PEG tube placement on 8/4-8/5. Patient has been managed on hospitalist service since. Did have episodes of persistent leakage from esophagus on esophagram as well as requiring right thorascopic for drainage of loculated pleural effusion on 8/13 by CT surgery, repeat esophagram on 8/16 did not show any esophageal leak.   Assessment & Plan:   Active Problems:   Essential hypertension   Atrial tachycardia (HCC)   Dyslipidemia   CAP (community acquired pneumonia)   Transaminitis   Lobar pneumonia ([place])   Sepsis due to undetermined organism ([name])   Fever   Gastroesophageal reflux disease with esophagitis   Shortness of breath   Thyroid nodule   BPH (benign prostatic hyperplasia)   Loculated pleural effusion   Ureteral stone with hydronephrosis   Pneumothorax on right   Acute respiratory failure with hypoxia (HCC)   Chronic bilateral pleural effusions   Parapneumonic effusion   Malnutrition of  moderate degree   Esophageal perforation   Pressure injury of skin   Atrial fibrillation with RVR (HCC)   Esophageal anastomotic leak   Atelectasis   Right lower lobe pulmonary infiltrate   Sinus tachycardia   Dysphagia   Respiratory distress   Status post thoracentesis   Acute respiratory failure with hypoxia Secondary to below. Resolved.  Pneumonia Parapneumonic effusion Chest tube placed initially for management and now removed. Pleural fluid culture significant for candida parapsilosis. Patient started on Fluconazole and completed course. Also managed on Cefepime and completed course.  Tachypnea Persistent. -Chest x-ray  Apical pneumothorax Chest tube placed and managed by CT surgery. Removed.  Sepsis Present on admission initially with recurrent sepsis physiology during hospitalization. In setting of aspiration pneumonia, empyema and mediastinitis. Patient treated empirically with Cefepime. Physiology mostly resolved except for some tachycardia. Leukocytosis slightly elevated but stabilized.  Esophageal rupture Stent placed by cardiothoracic surgery  Oropharyngeal dysphagia -SLP recommendations: thin liquid -Continue tube feeds  Acute on chronic blood loss anemia In setting of blood loss from esophageal rupture/surgery in addition to hematemesis which have resolved. Stable.  Paroxysmal atrial fibrillation Tachycardia but currently sinus rhythm. CTA chest negative for acute PE -Continue metoprolol and Eliquis  Chronic sinus tachycardia No etiology found other than debility. No PE on CTA. Currently on metoprolol. Improved this morning with HR in 90s -Continue metoprolol 75 mg BID  History of stroke On Lipitor as an outpatient which was held secondary to elevated LFTs  Elevated LFTs Trended down.  Left hydronephrosis Plan for shockwave lithotripsy as an outpatient.  History of stroke Chronic right-sided weakness.  Pressure injury Stage II of the right  buttock  Hyponatremia Improved.  Depressed mood/flat affect Unsure if this is related to  stroke vs depression. Patient states he has a history of depression and that he has been on Prozac in the past which has been helpful. -Continue Prozac 20 mg daily per tube   DVT prophylaxis: Eliquis Code Status:   Code Status: Full Code Family Communication: None at bedside Disposition Plan: Discharge pending bed availability. Currently, no bed availability. Medically stable for discharge once bed is available.   Consultants:   Cardiothoracic surgery  Procedures:   VATS/DECORTICATION/PEG PLACEMENT/ESOPHAGEAL STENT PLACEMENT (05/17/2020)  RIGHT THORACOSCOPY/SOPHAGOGASTROSCOPY/ESOPHAGEAL STENT PLACEMENT/ESOPHAGRAM  Antimicrobials:  Vancomycin  Zosyn  Doxycycline  Ceftriaxone  Azithromycin  Amoxicillin    Subjective: No chest pain or dyspnea.  Objective: Vitals:   06/18/20 0100 06/18/20 0300 06/18/20 0711 06/18/20 0800  BP:  129/86 (!) 139/97 134/84  Pulse: (!) 113 (!) 114 (!) 127 (!) 118  Resp: (!) 28 20 (!) 33 (!) 27  Temp:  99.6 F (37.6 C) 99 F (37.2 C)   TempSrc:  Oral Oral   SpO2: 95% 95% 100% 93%  Weight:  58.8 kg    Height:        Intake/Output Summary (Last 24 hours) at 06/18/2020 0850 Last data filed at 06/18/2020 0531 Gross per 24 hour  Intake 1430 ml  Output 1050 ml  Net 380 ml   Filed Weights   06/16/20 0500 06/17/20 0500 06/18/20 0300  Weight: 57.6 kg 57.2 kg 58.8 kg    Examination:  General exam: Appears calm and comfortable Respiratory system: Clear to auscultation. Respiratory effort normal. Cardiovascular system: S1 & S2 heard, Tachycardia. No murmurs, rubs, gallops or clicks. Gastrointestinal system: Abdomen is nondistended, soft and nontender. No organomegaly or masses felt. Normal bowel sounds heard. Central nervous system: Alert. Does not verbalize but answers questions with nods/shakes Musculoskeletal: No edema. No calf  tenderness Skin: No cyanosis. No rashes    Data Reviewed: I have personally reviewed following labs and imaging studies  CBC Lab Results  Component Value Date   WBC 12.3 (H) 06/17/2020   RBC 3.70 (L) 06/17/2020   HGB 8.9 (L) 06/17/2020   HCT 30.3 (L) 06/17/2020   MCV 81.9 06/17/2020   MCH 24.1 (L) 06/17/2020   PLT 411 (H) 06/17/2020   MCHC 29.4 (L) 06/17/2020   RDW 15.7 (H) 06/17/2020   LYMPHSABS 0.8 06/05/2020   MONOABS 0.8 06/05/2020   EOSABS 0.1 06/05/2020   BASOSABS 0.0 74/09/8785     Last metabolic panel Lab Results  Component Value Date   NA 130 (L) 06/17/2020   K 4.3 06/17/2020   CL 95 (L) 06/17/2020   CO2 25 06/17/2020   BUN 12 06/17/2020   CREATININE 0.69 06/17/2020   GLUCOSE 145 (H) 06/17/2020   GFRNONAA >60 06/17/2020   GFRAA >60 06/17/2020   CALCIUM 8.5 (L) 06/17/2020   PHOS 3.5 05/30/2020   PROT 7.5 06/17/2020   ALBUMIN 2.1 (L) 06/17/2020   BILITOT 0.5 06/17/2020   ALKPHOS 142 (H) 06/17/2020   AST 55 (H) 06/17/2020   ALT 154 (H) 06/17/2020   ANIONGAP 10 06/17/2020    CBG (last 3)  Recent Labs    06/17/20 1631 06/17/20 2058 06/18/20 0621  GLUCAP 146* 148* 166*     GFR: Estimated Creatinine Clearance: 81.7 mL/min (by C-G formula based on SCr of 0.69 mg/dL).  Coagulation Profile: No results for input(s): INR, PROTIME in the last 168 hours.  Recent Results (from the past 240 hour(s))  Culture, blood (routine x 2)     Status: None   Collection Time:  06/10/20  1:37 PM   Specimen: BLOOD RIGHT HAND  Result Value Ref Range Status   Specimen Description BLOOD RIGHT HAND  Final   Special Requests   Final    BOTTLES DRAWN AEROBIC AND ANAEROBIC Blood Culture adequate volume   Culture   Final    NO GROWTH 5 DAYS Performed at [facility], [address], [place], [place] [address]    Report Status 06/15/2020 FINAL  Final  Culture, blood (routine x 2)     Status: None   Collection Time: 06/10/20  1:43 PM   Specimen: BLOOD LEFT HAND   Result Value Ref Range Status   Specimen Description BLOOD LEFT HAND  Final   Special Requests   Final    BOTTLES DRAWN AEROBIC ONLY Blood Culture results may not be optimal due to an inadequate volume of blood received in culture bottles   Culture   Final    NO GROWTH 5 DAYS Performed at [facility], [place] [address], [place], [place] [address]    Report Status 06/15/2020 FINAL  Final  SARS Coronavirus 2 by RT PCR (hospital order, performed in [facility] hospital lab) Nasopharyngeal Nasopharyngeal Swab     Status: None   Collection Time: 06/11/20  1:43 PM   Specimen: Nasopharyngeal Swab  Result Value Ref Range Status   SARS Coronavirus 2 NEGATIVE NEGATIVE Final    Comment: (NOTE) SARS-CoV-2 target nucleic acids are NOT DETECTED.  The SARS-CoV-2 RNA is generally detectable in upper and lower respiratory specimens during the acute phase of infection. The lowest concentration of SARS-CoV-2 viral copies this assay can detect is 250 copies / mL. A negative result does not preclude SARS-CoV-2 infection and should not be used as the sole basis for treatment or other patient management decisions.  A negative result may occur with improper specimen collection / handling, submission of specimen other than nasopharyngeal swab, presence of viral mutation(s) within the areas targeted by this assay, and inadequate number of viral copies (<250 copies / mL). A negative result must be combined with clinical observations, patient history, and epidemiological information.  Fact Sheet for Patients:   StrictlyIdeas.no  Fact Sheet for Healthcare Providers: BankingDealers.co.za  This test is not yet approved or  cleared by the [place] FDA and has been authorized for detection and/or diagnosis of SARS-CoV-2 by FDA under an Emergency Use Authorization (EUA).  This EUA will remain in effect (meaning this test can be used) for the duration of  the COVID-19 declaration under Section 564(b)(1) of the Act, 21 U.S.C. section 360bbb-3(b)(1), unless the authorization is terminated or revoked sooner.  Performed at [facility], [place] [address] [place], [place] [address]         Radiology Studies: No results found.      Scheduled Meds: . apixaban  5 mg Oral BID  . bisacodyl  10 mg Oral Daily  . feeding supplement (PROSource TF)  45 mL Per Tube BID  . FLUoxetine  20 mg Per Tube Daily  . insulin aspart  0-24 Units Subcutaneous TID AC & HS  . mouth rinse  15 mL Mouth Rinse q12n4p  . metoprolol tartrate  75 mg Per Tube BID  . pantoprazole sodium  40 mg Per Tube BID  . sennosides  5 mL Per Tube QHS  . traZODone  50 mg Oral QHS   Continuous Infusions: . sodium chloride 1,000 mL (06/06/20 0028)  . sodium chloride    . feeding  supplement (OSMOLITE 1.5 CAL) 1,000 mL (06/17/20 2338)     LOS: 38 days     Cordelia Poche, MD [facility] 06/18/2020, 8:50 AM  If 7PM-7AM, please contact night-coverage [email or web address]

## 2020-06-19 ENCOUNTER — Inpatient Hospital Stay (HOSPITAL_COMMUNITY): Payer: Medicaid Other

## 2020-06-19 LAB — GLUCOSE, CAPILLARY
Glucose-Capillary: 136 mg/dL — ABNORMAL HIGH (ref 70–99)
Glucose-Capillary: 132 mg/dL — ABNORMAL HIGH (ref 70–99)
Glucose-Capillary: 114 mg/dL — ABNORMAL HIGH (ref 70–99)
Glucose-Capillary: 143 mg/dL — ABNORMAL HIGH (ref 70–99)

## 2020-06-19 LAB — BASIC METABOLIC PANEL
Anion gap: 7 (ref 5–15)
BUN: 12 mg/dL (ref 6–20)
CO2: 29 mmol/L (ref 22–32)
Calcium: 8.9 mg/dL (ref 8.9–10.3)
Chloride: 97 mmol/L — ABNORMAL LOW (ref 98–111)
Creatinine, Ser: 0.68 mg/dL (ref 0.61–1.24)
GFR calc Af Amer: >60 mL/min (ref >60)
GFR calc non Af Amer: >60 mL/min (ref >60)
Glucose, Bld: 132 mg/dL — ABNORMAL HIGH (ref 70–99)
Potassium: 4.7 mmol/L (ref 3.5–5.1)
Sodium: 133 mmol/L — ABNORMAL LOW (ref 135–145)

## 2020-06-19 MED ORDER — TRAZODONE HCL 50 MG PO TABS
50.0000 mg | ORAL_TABLET | Freq: Every day | ORAL | Status: DC
  Administered 2020-06-19 – 2020-07-11 (×23): 50 mg
  Filled 2020-06-19 (×23): qty 1

## 2020-06-19 MED ORDER — METOPROLOL TARTRATE 25 MG/10 ML ORAL SUSPENSION
75.0000 mg | Freq: Two times a day (BID) | ORAL | Status: DC
  Administered 2020-06-19 – 2020-06-21 (×5): 75 mg
  Filled 2020-06-19 (×6): qty 30

## 2020-06-19 MED ORDER — IOHEXOL 300 MG/ML  SOLN
75.0000 mL | Freq: Once | INTRAMUSCULAR | Status: AC | PRN
  Administered 2020-06-19: 75 mL via INTRAVENOUS

## 2020-06-19 NOTE — Progress Notes (Signed)
Nutrition Follow-up  DOCUMENTATION CODES:   Non-severe (moderate) malnutrition in context of chronic illness  INTERVENTION:   Continue tube feeding via PEG: -Osmolite 1.5@ 93m/hr(1440 ml/day) - ProSource TF 45 ml BID -Free water flushes per MD  Tube feeding regimenprovides 2240kcal,112grams of protein, and10970mof H2O.  NUTRITION DIAGNOSIS:   Moderate Malnutrition related to chronic illness (Barrett's esophagus, GERD) as evidenced by moderate fat depletion, moderate muscle depletion.  Ongoing  GOAL:   Patient will meet greater than or equal to 90% of their needs  Met via TF  MONITOR:   Diet advancement, Labs, Weight trends, TF tolerance, Skin, I & O's  REASON FOR ASSESSMENT:   Consult Enteral/tube feeding initiation and management  ASSESSMENT:   Evan Chavez who presented on 7/28 with fever, SOB. PMH of stroke, HLD, GERD, Barrett's esophagus, PUD, s/p balloon stricturoplasty for GE junction in 2020. Admitted with severe sepsis secondary to bilateral pleural effusions with concomitant bibasilar pneumonia.  8/02 - MBSS with recommendation for regular diet with thin liquids, s/p thoracentesis 8/04 - s/p chest tube insertion, chest CT showing esophageal perforation 8/05 - s/p VATS, esophageal stent placement, PEG tube placement 8/13- s/pright thoracoscopy, drainage of loculated pleural effusion, esophagogastroscopy,esophageal stent placement, esophagram with Omnipaque 8/16 - swallow study showing no leak 8/19 - diet advanced to clear liquids 8/25 - FEES showing no observable oropharyngeal dysphagia (other than oral aversion) with recommendation for thin liquids  Per notes, chest x-ray with worsening opacity in LLL. Pt remains on clear liquid diet. Pt started on Prozac for depression.  Attempted to speak with pt at bedside. Pt sleeping soundly at time of RD visit and did not awaken to RD voice. TF infusing as ordered. Weight stable over the last week.  Will continue to monitor weight and adjust TF regimen as needed.  Admit weight: 67.5 kg Current weight: 59.2 kg  Current TF: Osmolite 1.5 @ 60 ml/hr, ProSource TF 45 ml BID  Meal Completion: 0% x last 8 documented meals  Medications reviewed and include: dulcolax, SSI, protonix, senokot  Labs reviewed: sodium 133, hemoglobin 9.4, elevated LFTs CBG's: 114-136 x 24 hours  Diet Order:   Diet Order            Diet clear liquid Room service appropriate? Yes; Fluid consistency: Thin  Diet effective now                 EDUCATION NEEDS:   Education needs have been addressed  Skin:  Skin Assessment: Reviewed RN Assessment  Last BM:  06/19/20 large type 6  Height:   Ht Readings from Last 1 Encounters:  05/17/20 '5\' 9"'  (1.753 m)    Weight:   Wt Readings from Last 1 Encounters:  06/19/20 59.2 kg    Ideal Body Weight:  72.7 kg  BMI:  Body mass index is 19.27 kg/m.  Estimated Nutritional Needs:   Kcal:  2000-2200  Protein:  100-115 grams  Fluid:  >/= 2.0 L    KaGaynell FaceMS, RD, LDN Inpatient Clinical Dietitian Please see AMiON for contact information.

## 2020-06-19 NOTE — Plan of Care (Signed)

## 2020-06-19 NOTE — Progress Notes (Signed)
Physical Therapy Treatment Patient Details Name: Evan Chavez MRN: [id number] DOB: 08/21/1961 Today's Date: 06/19/2020    History of Present Illness Pt is 59 yo male presents to the hospital on 7/28 with generalized weakness, productive cough and low-grade fever. Pt is currently being treated for hydropneumothorax, Sepsis aspiration pneumonia and esophagal rupture, AKI and acute delirium. s/p R thoracentesis on 8/2. Chest tube placed 8/2-8/9. s/p VATS, decortication, EGD, PEG tube placement, and a esophageal stent on 8/9. PMH of GERD, Barrett's esophagus, PUD with extensive gastric erosion, duodenal ulcer and esophageal stricture, stroke, HLD, BPH, cocaine abuse. s/p Right thoracoscopy, chest tube placement, esophageal stent placement 8/13-8/20.    PT Comments    Pt demonstrating gradual improvement.  Able to increase gait and less assist with transfers today , but fatigues easily.  HR max today was 113 bpm with ambulation.  Required increased time and multimodal cues during therapy.  RN reports that pt up/down bed to chair frequently last night - pt was fatigued after ambulation and declined exercises. Cont POC.    Follow Up Recommendations  SNF     Equipment Recommendations  3in1 (PT);Rolling walker with 5" wheels    Recommendations for Other Services       Precautions / Restrictions Precautions Precautions: Fall Precaution Comments: PEG, right weakness Restrictions Weight Bearing Restrictions: No    Mobility  Bed Mobility Overal bed mobility: Needs Assistance Bed Mobility: Supine to Sit     Supine to sit: Min guard;HOB elevated        Transfers Overall transfer level: Needs assistance Equipment used: Rolling walker (2 wheeled)   Sit to Stand: Min assist         General transfer comment: cues for hand placement with assist to rise  Ambulation/Gait Ambulation/Gait assistance: Min assist Gait Distance (Feet): 70 Feet Assistive device: Rolling walker (2  wheeled) Gait Pattern/deviations: Step-to pattern;Trunk flexed;Narrow base of support     General Gait Details: Pt with tendency to keep body partially rotated in walker with partial side stepping to progress gait with L side forward. Flexed trunk with mod cues to stand straight and  step into RW.  Cues for increased step length on R.  Min A with walker for turns.   Stairs             Wheelchair Mobility    Modified Rankin (Stroke Patients Only)       Balance Overall balance assessment: Needs assistance Sitting-balance support: Feet supported;No upper extremity supported Sitting balance-Leahy Scale: Good     Standing balance support: Bilateral upper extremity supported Standing balance-Leahy Scale: Poor Standing balance comment: walker for static standing                            Cognition Arousal/Alertness: Awake/alert Behavior During Therapy: Flat affect Overall Cognitive Status: Impaired/Different from baseline                         Following Commands: Follows one step commands consistently;Follows one step commands with increased time     Problem Solving: Slow processing;Decreased initiation;Requires verbal cues General Comments: Pt with little verbalization (only when propted to answer choice question) but did nod yes/no appropriately and consistently      Exercises      General Comments General comments (skin integrity, edema, etc.): Pt on RA with stable O2 sats.  HR 113 bpm max.  Pt reported fatigue after walking and  unable to participate with exercises.  RN reports that pt was up/down all night      Pertinent Vitals/Pain Pain Assessment: No/denies pain    Home Living                      Prior Function            PT Goals (current goals can now be found in the care plan section) Acute Rehab PT Goals Patient Stated Goal: get better PT Goal Formulation: With patient Time For Goal Achievement: 06/29/20 Potential  to Achieve Goals: Good Progress towards PT goals: Progressing toward goals    Frequency    Min 2X/week      PT Plan Current plan remains appropriate    Co-evaluation              AM-PAC PT "6 Clicks" Mobility   Outcome Measure  Help needed turning from your back to your side while in a flat bed without using bedrails?: A Little Help needed moving from lying on your back to sitting on the side of a flat bed without using bedrails?: A Little Help needed moving to and from a bed to a chair (including a wheelchair)?: A Little Help needed standing up from a chair using your arms (e.g., wheelchair or bedside chair)?: A Little Help needed to walk in hospital room?: A Little Help needed climbing 3-5 steps with a railing? : A Little 6 Click Score: 18    End of Session Equipment Utilized During Treatment: Gait belt Activity Tolerance: Patient limited by fatigue Patient left: in chair;with call bell/phone within reach;with chair alarm set Nurse Communication: Mobility status PT Visit Diagnosis: Unsteadiness on feet (R26.81);Muscle weakness (generalized) (M62.81)     Time: 1100-1115 PT Time Calculation (min) (ACUTE ONLY): 15 min  Charges:  $Gait Training: 8-22 mins                     Abran Richard, PT Acute Rehab Services Pager [phone] [name] [place] 06/19/2020, 11:21 AM

## 2020-06-19 NOTE — Progress Notes (Addendum)
PROGRESS NOTE    Evan Chavez  WEX:[id number] DOB: [birth year] DOA: 05/10/2020 PCP: Rosita Fire, MD   Brief Narrative: Evan Chavez is a 59 y.o. malewith medical history significant for GERD, Barrett's esophagus, peptic ulcer disease with extensive gastric erosion, duodenal ulcerand esophageal stricture status post balloon dilatation on 12/2018, also with history of prior stroke, hyperlipidemia, BPH, remote cocaine abuse and prior CVA. Patient presented secondary to weakness, productive cough, fever and dyspnea and found to have pneumonia.   Hospital course complicated by right-sided hydropneumothorax with possible lung collapse after right thoracentesis on 8/2, esophageal perforation (CT chest on 8/4)With large right-sided hydropneumothorax, complicated by aspiration pneumonia. Patient underwent right-sided chest tube placement was transferred from [facility] to [facility] where he underwent VATS and decortication by CT surgery as well as esophageal stent placement and PEG tube placement on 8/4-8/5. Patient has been managed on hospitalist service since. Did have episodes of persistent leakage from esophagus on esophagram as well as requiring right thorascopic for drainage of loculated pleural effusion on 8/13 by CT surgery, repeat esophagram on 8/16 did not show any esophageal leak.   Assessment & Plan:   Active Problems:   Essential hypertension   Atrial tachycardia (HCC)   Dyslipidemia   CAP (community acquired pneumonia)   Transaminitis   Lobar pneumonia ([place])   Sepsis due to undetermined organism ([place])   Fever   Gastroesophageal reflux disease with esophagitis   Shortness of breath   Thyroid nodule   BPH (benign prostatic hyperplasia)   Loculated pleural effusion   Ureteral stone with hydronephrosis   Pneumothorax on right   Acute respiratory failure with hypoxia (HCC)   Chronic bilateral pleural effusions   Parapneumonic effusion   Malnutrition of  moderate degree   Esophageal perforation   Pressure injury of skin   Atrial fibrillation with RVR (HCC)   Esophageal anastomotic leak   Atelectasis   Right lower lobe pulmonary infiltrate   Sinus tachycardia   Dysphagia   Respiratory distress   Status post thoracentesis   Acute respiratory failure with hypoxia Secondary to below. Resolved.  Pneumonia Parapneumonic effusion Chest tube placed initially for management and now removed. Pleural fluid culture significant for candida parapsilosis. Patient started on Fluconazole and completed course. Also managed on Cefepime and completed course.  Tachypnea Persistent. Chest x-ray with worsening opacity in LLL. -CT chest w/ contrast  Apical pneumothorax Chest tube placed and managed by CT surgery. Removed.  Sepsis Present on admission initially with recurrent sepsis physiology during hospitalization. In setting of aspiration pneumonia, empyema and mediastinitis. Patient treated empirically with Cefepime. Physiology mostly resolved except for some tachycardia. Leukocytosis slightly elevated but stabilized.  Esophageal rupture Stent placed by cardiothoracic surgery  Oropharyngeal dysphagia -SLP recommendations: thin liquid -Continue tube feeds  Acute on chronic blood loss anemia In setting of blood loss from esophageal rupture/surgery in addition to hematemesis which have resolved. Stable.  Paroxysmal atrial fibrillation Tachycardia but currently sinus rhythm. CTA chest negative for acute PE -Continue metoprolol and Eliquis  Chronic sinus tachycardia No etiology found other than debility. No PE on CTA. Currently on metoprolol. Improved this morning with HR in 90s -Continue metoprolol 75 mg BID  History of stroke On Lipitor as an outpatient which was held secondary to elevated LFTs  Elevated LFTs Trended down.  Left hydronephrosis Plan for shockwave lithotripsy as an outpatient.  History of stroke Chronic right-sided  weakness.  Pressure injury Stage II of the right buttock  Hyponatremia Improved.  Depressed mood/flat affect Unsure if this is related to stroke vs depression. Patient states he has a history of depression and that he has been on Prozac in the past which has been helpful. -Continue Prozac 20 mg daily per tube  Addendum: CT chest concerning for possible empyema, esophageal leak, mediastinitis. Re-consulted CT surgery today for evaluation and management. Blood cultures obtained today. Will not be ready for discharge medically until this is addressed.   DVT prophylaxis: Eliquis Code Status:   Code Status: Full Code Family Communication: None at bedside Disposition Plan: Discharge pending bed availability. Currently, no bed availability. Medically stable for discharge once bed is available.   Consultants:   Cardiothoracic surgery  Procedures:   VATS/DECORTICATION/PEG PLACEMENT/ESOPHAGEAL STENT PLACEMENT (05/17/2020)  RIGHT THORACOSCOPY/SOPHAGOGASTROSCOPY/ESOPHAGEAL STENT PLACEMENT/ESOPHAGRAM  Antimicrobials:  Vancomycin  Zosyn  Doxycycline  Ceftriaxone  Azithromycin  Amoxicillin    Subjective: No concerns. No chest pain or dyspnea.  Objective: Vitals:   06/18/20 2300 06/18/20 2341 06/19/20 0300 06/19/20 0332  BP:  129/84  136/88  Pulse: 100 (!) 102  (!) 107  Resp: (!) 33 (!) 23 19 (!) 31  Temp:  98.2 F (36.8 C)  98.1 F (36.7 C)  TempSrc:  Oral  Oral  SpO2: 98% 95%  96%  Weight:    59.2 kg  Height:        Intake/Output Summary (Last 24 hours) at 06/19/2020 0745 Last data filed at 06/19/2020 0300 Gross per 24 hour  Intake 1590 ml  Output 200 ml  Net 1390 ml   Filed Weights   06/17/20 0500 06/18/20 0300 06/19/20 0332  Weight: 57.2 kg 58.8 kg 59.2 kg    Examination:  General exam: Appears calm and comfortable Respiratory system: Clear to auscultation with diminished sounds. Respiratory effort normal. Mild tachypnea Cardiovascular system: S1 & S2  heard, Tachycardia, normal rhythm. No murmurs, rubs, gallops or clicks. Gastrointestinal system: Abdomen is nondistended, soft and nontender. No organomegaly or masses felt. Normal bowel sounds heard. Central nervous system: Alert and oriented. Does not verbalize; answers with head nods and shakes Musculoskeletal: No edema. No calf tenderness Skin: No cyanosis. No rashes Psychiatry: Judgement and insight appear normal. Flat affect    Data Reviewed: I have personally reviewed following labs and imaging studies  CBC Lab Results  Component Value Date   WBC 11.0 (H) 06/18/2020   RBC 3.86 (L) 06/18/2020   HGB 9.4 (L) 06/18/2020   HCT 31.7 (L) 06/18/2020   MCV 82.1 06/18/2020   MCH 24.4 (L) 06/18/2020   PLT 405 (H) 06/18/2020   MCHC 29.7 (L) 06/18/2020   RDW 15.8 (H) 06/18/2020   LYMPHSABS 0.8 06/05/2020   MONOABS 0.8 06/05/2020   EOSABS 0.1 06/05/2020   BASOSABS 0.0 84/16/6063     Last metabolic panel Lab Results  Component Value Date   NA 133 (L) 06/19/2020   K 4.7 06/19/2020   CL 97 (L) 06/19/2020   CO2 29 06/19/2020   BUN 12 06/19/2020   CREATININE 0.68 06/19/2020   GLUCOSE 132 (H) 06/19/2020   GFRNONAA >60 06/19/2020   GFRAA >60 06/19/2020   CALCIUM 8.9 06/19/2020   PHOS 3.5 05/30/2020   PROT 7.5 06/17/2020   ALBUMIN 2.1 (L) 06/17/2020   BILITOT 0.5 06/17/2020   ALKPHOS 142 (H) 06/17/2020   AST 55 (H) 06/17/2020   ALT 154 (H) 06/17/2020   ANIONGAP 7 06/19/2020    CBG (last 3)  Recent Labs    06/18/20 1605 06/18/20 2113 06/19/20 0623  GLUCAP 114*  133* 136*     GFR: Estimated Creatinine Clearance: 82.2 mL/min (by C-G formula based on SCr of 0.68 mg/dL).  Coagulation Profile: No results for input(s): INR, PROTIME in the last 168 hours.  Recent Results (from the past 240 hour(s))  Culture, blood (routine x 2)     Status: None   Collection Time: 06/10/20  1:37 PM   Specimen: BLOOD RIGHT HAND  Result Value Ref Range Status   Specimen Description BLOOD  RIGHT HAND  Final   Special Requests   Final    BOTTLES DRAWN AEROBIC AND ANAEROBIC Blood Culture adequate volume   Culture   Final    NO GROWTH 5 DAYS Performed at [facility], [address] [place], [place] [address]    Report Status 06/15/2020 FINAL  Final  Culture, blood (routine x 2)     Status: None   Collection Time: 06/10/20  1:43 PM   Specimen: BLOOD LEFT HAND  Result Value Ref Range Status   Specimen Description BLOOD LEFT HAND  Final   Special Requests   Final    BOTTLES DRAWN AEROBIC ONLY Blood Culture results may not be optimal due to an inadequate volume of blood received in culture bottles   Culture   Final    NO GROWTH 5 DAYS Performed at [facility], [place] [address] [place], [place] [address]    Report Status 06/15/2020 FINAL  Final  SARS Coronavirus 2 by RT PCR (hospital order, performed in [facility] hospital lab) Nasopharyngeal Nasopharyngeal Swab     Status: None   Collection Time: 06/11/20  1:43 PM   Specimen: Nasopharyngeal Swab  Result Value Ref Range Status   SARS Coronavirus 2 NEGATIVE NEGATIVE Final    Comment: (NOTE) SARS-CoV-2 target nucleic acids are NOT DETECTED.  The SARS-CoV-2 RNA is generally detectable in upper and lower respiratory specimens during the acute phase of infection. The lowest concentration of SARS-CoV-2 viral copies this assay can detect is 250 copies / mL. A negative result does not preclude SARS-CoV-2 infection and should not be used as the sole basis for treatment or other patient management decisions.  A negative result may occur with improper specimen collection / handling, submission of specimen other than nasopharyngeal swab, presence of viral mutation(s) within the areas targeted by this assay, and inadequate number of viral copies (<250 copies / mL). A negative result must be combined with clinical observations, patient history, and epidemiological information.  Fact Sheet for Patients:     StrictlyIdeas.no  Fact Sheet for Healthcare Providers: BankingDealers.co.za  This test is not yet approved or  cleared by the [place] FDA and has been authorized for detection and/or diagnosis of SARS-CoV-2 by FDA under an Emergency Use Authorization (EUA).  This EUA will remain in effect (meaning this test can be used) for the duration of the COVID-19 declaration under Section 564(b)(1) of the Act, 21 U.S.C. section 360bbb-3(b)(1), unless the authorization is terminated or revoked sooner.  Performed at [facility], [place] [address] [place], [place] [address]         Radiology Studies: DG CHEST PORT 1 VIEW  Result Date: 06/18/2020 CLINICAL DATA:  [place] EXAM: PORTABLE CHEST 1 VIEW COMPARISON:  Eight days ago FINDINGS: Increased hazy density at the right base where there is a pleural effusion and atelectasis by recent CT. Stable positioning of the esophageal stent. Normal heart size and aortic contours. No visible pneumothorax. IMPRESSION: Right pleural effusion and lower lobe collapse  with mildly increased opacity since 06/10/2020. Electronically Signed   By: Monte Fantasia M.D.   On: 06/18/2020 09:47        Scheduled Meds:  apixaban  5 mg Oral BID   bisacodyl  10 mg Oral Daily   feeding supplement (PROSource TF)  45 mL Per Tube BID   FLUoxetine  20 mg Per Tube Daily   insulin aspart  0-24 Units Subcutaneous TID AC & HS   mouth rinse  15 mL Mouth Rinse q12n4p   metoprolol tartrate  75 mg Per Tube BID   pantoprazole sodium  40 mg Per Tube BID   sennosides  5 mL Per Tube QHS   traZODone  50 mg Oral QHS   Continuous Infusions:  sodium chloride 1,000 mL (06/06/20 0028)   sodium chloride     feeding supplement (OSMOLITE 1.5 CAL) 1,000 mL (06/18/20 1649)     LOS: 39 days     Cordelia Poche, MD [facility] 06/19/2020, 7:45 AM  If 7PM-7AM, please contact night-coverage [email or web address]

## 2020-06-19 NOTE — Discharge Instructions (Addendum)

## 2020-06-19 NOTE — Progress Notes (Signed)
Occupational Therapy Treatment Patient Details Name: Evan Chavez MRN: [id number] DOB: 07-Aug-1961 Today's Date: 06/19/2020    History of present illness Pt is 59 yo male presents to the hospital on 7/28 with generalized weakness, productive cough and low-grade fever. Pt is currently being treated for hydropneumothorax, Sepsis aspiration pneumonia and esophagal rupture, AKI and acute delirium. s/p R thoracentesis on 8/2. Chest tube placed 8/2-8/9. s/p VATS, decortication, EGD, PEG tube placement, and a esophageal stent on 8/9. PMH of GERD, Barrett's esophagus, PUD with extensive gastric erosion, duodenal ulcer and esophageal stricture, stroke, HLD, BPH, cocaine abuse. s/p Right thoracoscopy, chest tube placement, esophageal stent placement 8/13-8/20.   OT comments  Pt progressing towards established OT goals. Pt continues to present with decreased activity tolerance and stating "I am tired" during ADLs. Pt requiring Mod cues for sequencing during oral care at sink. Pt requiring Min Guard A and RW during functional mobility. Pt also stating "I am hurting" and when asked where, pt stating "at my chest." Continue to recommend dc to SNF and will continue to follow acutely as admitted.    Follow Up Recommendations  SNF    Equipment Recommendations  Other (comment) (Defer to next venue)    Recommendations for Other Services      Precautions / Restrictions Precautions Precautions: Fall Precaution Comments: PEG, right weakness       Mobility Bed Mobility               General bed mobility comments: In recliner upon arrival  Transfers Overall transfer level: Needs assistance Equipment used: Rolling walker (2 wheeled) Transfers: Sit to/from Stand Sit to Stand: Min guard         General transfer comment: Min Guard A for safety    Balance Overall balance assessment: Needs assistance Sitting-balance support: Feet supported;No upper extremity supported Sitting balance-Leahy  Scale: Good Sitting balance - Comments: static sitting without assist   Standing balance support: Bilateral upper extremity supported;During functional activity Standing balance-Leahy Scale: Poor Standing balance comment: requiring UE support                           ADL either performed or assessed with clinical judgement   ADL Overall ADL's : Needs assistance/impaired     Grooming: Oral care;Min guard;Sitting;Cueing for sequencing Grooming Details (indicate cue type and reason): Pt requiring Mod cues to start sequence of brushing teeth. Pt requiring significant time due to processing and fatigue.                  Toilet Transfer: Min guard;Ambulation;RW;BSC Toilet Transfer Details (indicate cue type and reason): Min Guard A for safety and cues for hand placement         Functional mobility during ADLs: Min guard;Rolling walker General ADL Comments: Pt continues to present with decreased activity tolerance impacting his safety and performance of ADLs     Vision       Perception     Praxis      Cognition Arousal/Alertness: Awake/alert Behavior During Therapy: Flat affect Overall Cognitive Status: Impaired/Different from baseline Area of Impairment: Following commands;Problem solving                   Current Attention Level: Sustained   Following Commands: Follows one step commands with increased time;Follows multi-step commands with increased time;Follows multi-step commands inconsistently   Awareness: Emergent Problem Solving: Slow processing;Decreased initiation;Requires verbal cues General Comments: Pt following simple commands with  increased time. Pt stating "I am tired" during oral care at sink, and then stating "I am in pain." When asked where, pt stating "at my chest." Requiring cues for initating of ADLs tasks; however, unsure of this is mainly from fatigue        Exercises     Shoulder Instructions       General Comments HR  113. RR 29. SpO2 96% on RA.     Pertinent Vitals/ Pain       Pain Assessment: Faces Faces Pain Scale: Hurts even more Pain Location: Chest Pain Descriptors / Indicators: Constant;Discomfort;Grimacing Pain Intervention(s): Monitored during session;Limited activity within patient's tolerance;Repositioned  Home Living                                          Prior Functioning/Environment              Frequency  Min 2X/week        Progress Toward Goals  OT Goals(current goals can now be found in the care plan section)  Progress towards OT goals: Progressing toward goals  Acute Rehab OT Goals Patient Stated Goal: get better OT Goal Formulation: With patient Time For Goal Achievement: 06/20/20 Potential to Achieve Goals: Good ADL Goals Pt Will Perform Grooming: with set-up;standing;sitting Pt Will Perform Lower Body Dressing: with set-up;sit to/from stand Pt Will Transfer to Toilet: with set-up;ambulating Additional ADL Goal #1: Pt will demonstrate independence with use of 3-5 energy conservation strategies during ADL/IADL and functional mobility.  Plan Discharge plan remains appropriate    Co-evaluation                 AM-PAC OT "6 Clicks" Daily Activity     Outcome Measure   Help from another person eating meals?: A Little Help from another person taking care of personal grooming?: A Little Help from another person toileting, which includes using toliet, bedpan, or urinal?: A Lot Help from another person bathing (including washing, rinsing, drying)?: A Lot Help from another person to put on and taking off regular upper body clothing?: A Little Help from another person to put on and taking off regular lower body clothing?: A Lot 6 Click Score: 15    End of Session Equipment Utilized During Treatment: Rolling walker  OT Visit Diagnosis: Unsteadiness on feet (R26.81);Other abnormalities of gait and mobility (R26.89);Muscle weakness  (generalized) (M62.81);Other symptoms and signs involving cognitive function;Feeding difficulties (R63.3);Pain Pain - Right/Left: Right   Activity Tolerance Patient tolerated treatment well   Patient Left in chair;with call bell/phone within reach;with chair alarm set   Nurse Communication Mobility status        Time: 4098-1191 OT Time Calculation (min): 24 min  Charges: OT General Charges $OT Visit: 1 Visit OT Treatments $Self Care/Home Management : 23-37 mins  [place], OTR/L Acute Rehab Pager: [phone] Office: [place] 06/19/2020, 5:51 PM

## 2020-06-20 LAB — GLUCOSE, CAPILLARY
Glucose-Capillary: 130 mg/dL — ABNORMAL HIGH (ref 70–99)
Glucose-Capillary: 146 mg/dL — ABNORMAL HIGH (ref 70–99)
Glucose-Capillary: 140 mg/dL — ABNORMAL HIGH (ref 70–99)
Glucose-Capillary: 138 mg/dL — ABNORMAL HIGH (ref 70–99)

## 2020-06-20 LAB — COMPREHENSIVE METABOLIC PANEL
ALT: 198 U/L — ABNORMAL HIGH (ref 0–44)
AST: 61 U/L — ABNORMAL HIGH (ref 15–41)
Albumin: 2.2 g/dL — ABNORMAL LOW (ref 3.5–5.0)
Alkaline Phosphatase: 145 U/L — ABNORMAL HIGH (ref 38–126)
Anion gap: 10 (ref 5–15)
BUN: 11 mg/dL (ref 6–20)
CO2: 26 mmol/L (ref 22–32)
Calcium: 8.8 mg/dL — ABNORMAL LOW (ref 8.9–10.3)
Chloride: 97 mmol/L — ABNORMAL LOW (ref 98–111)
Creatinine, Ser: 0.65 mg/dL (ref 0.61–1.24)
GFR calc Af Amer: >60 mL/min (ref >60)
GFR calc non Af Amer: >60 mL/min (ref >60)
Glucose, Bld: 157 mg/dL — ABNORMAL HIGH (ref 70–99)
Potassium: 4.1 mmol/L (ref 3.5–5.1)
Sodium: 133 mmol/L — ABNORMAL LOW (ref 135–145)
Total Bilirubin: 0.4 mg/dL (ref 0.3–1.2)
Total Protein: 7.4 g/dL (ref 6.5–8.1)

## 2020-06-20 LAB — CBC
HCT: 30.8 % — ABNORMAL LOW (ref 39.0–52.0)
Hemoglobin: 9.1 g/dL — ABNORMAL LOW (ref 13.0–17.0)
MCH: 23.9 pg — ABNORMAL LOW (ref 26.0–34.0)
MCHC: 29.5 g/dL — ABNORMAL LOW (ref 30.0–36.0)
MCV: 81.1 fL (ref 80.0–100.0)
Platelets: 434 10*3/uL — ABNORMAL HIGH (ref 150–400)
RBC: 3.8 MIL/uL — ABNORMAL LOW (ref 4.22–5.81)
RDW: 15.7 % — ABNORMAL HIGH (ref 11.5–15.5)
WBC: 10.4 10*3/uL (ref 4.0–10.5)
nRBC: 0 % (ref 0.0–0.2)

## 2020-06-20 LAB — FUNGUS CULTURE RESULT

## 2020-06-20 LAB — FUNGAL ORGANISM REFLEX

## 2020-06-20 LAB — FUNGUS CULTURE WITH STAIN

## 2020-06-20 MED ORDER — PHENOL 1.4 % MT LIQD
1.0000 | OROMUCOSAL | Status: DC | PRN
  Administered 2020-06-21: 1 via OROMUCOSAL
  Filled 2020-06-20: qty 177

## 2020-06-20 NOTE — Progress Notes (Signed)
PROGRESS NOTE    Evan Chavez  TDV:[id number] DOB: 08-13-1961 DOA: 05/10/2020 PCP: Rosita Fire, MD    Chief Complaint  Patient presents with  . Shortness of Breath    Brief Narrative:  59 year old gentleman with prior history of GERD, Barrett's esophagus, peptic ulcer disease with extensive gastric erosion, duodenal ulcer and esophageal stricture s/p balloon dilatation on 3/2 0 20, CVA, hyperlipidemia, BPH, remote cocaine abuse presents with weakness productive cough, fever and dyspnea was found to have community-acquired pneumonia.Hospital course complicated by right sided hydropneumothorax with possible lung collapse s/p thoracentesis on the right on 05/14/2020, esophageal perforation with right sided hydropneumothorax, complicated by aspiration pneumonia. Patient underwent right sided chest tube placement, transferred from [facility] to [facility] for VATS and decortication by CT surgery as well as esophageal stent placement and PEG placement on 05/16/20. Patient had episodes of persistent leakage from esophagus on esophagogram as well as underwent drainage of loculated pleural effusion on 05/25/20 by CT surgery. Repeat esophagogram on 05/28/2020 did not show any esophageal leak. CT chest with contrast on 9/7 showed Interval increase in a loculated right posteromedial pleural fluid collection with surrounding enhancing pleura, which is sterility indeterminate by imaging but possibly an empyema. CT surgery re consulted, suggested that residual entrapped lung and poor pulmonary toilet. No need for further surgery or drainage.    Assessment & Plan:   Active Problems:   Essential hypertension   Atrial tachycardia (HCC)   Dyslipidemia   CAP (community acquired pneumonia)   Transaminitis   Lobar pneumonia ([place])   Sepsis due to undetermined organism ([place])   Fever   Gastroesophageal reflux disease with esophagitis   Shortness of breath   Thyroid nodule   BPH (benign prostatic hyperplasia)   Loculated  pleural effusion   Ureteral stone with hydronephrosis   Pneumothorax on right   Acute respiratory failure with hypoxia (HCC)   Chronic bilateral pleural effusions   Parapneumonic effusion   Malnutrition of moderate degree   Esophageal perforation   Pressure injury of skin   Atrial fibrillation with RVR (HCC)   Esophageal anastomotic leak   Atelectasis   Right lower lobe pulmonary infiltrate   Sinus tachycardia   Dysphagia   Respiratory distress   Status post thoracentesis   Acute respiratory failure with hypoxia secondary to community-acquired pneumonia, Parapneumonic effusion. Patient underwent chest tube placement initially by cardiothoracic surgery.  Pleural fluid culture significant for Candida parapsilosis.  He completed the course of cefepime and fluconazole.   Hydropneumothorax s/p chest tube placement by CT surgery.  Resolved.    Sepsis present on admission:  In the setting of aspiration pneumonia, empyema and mediastinitis. Patient completed the course of cefepime. Patient continues to have some residual tachycardia.    Esophageal rupture s/p stent placement by CT surgery.   Oropharyngeal dysphagia s/p PEG placement on tube feeds.  SLP evaluation recommending clear liquid diet for now    History of paroxysmal atrial fibrillation Patient heart rate currently in sinus rhythm. CT angiogram is negative for PE. Continue with metoprolol for rate control and Eliquis for anticoagulation .   Acute on chronic blood loss anemia in the setting of blood loss from esophageal rupture/postop anemia in addition to hematemesis which have all resolved.  Currently hemoglobin is stable.   Chronic sinus tachycardia Heart rates between 90s to 120s. Continue with metoprolol 75 mg twice daily.    History of stroke with residual right-sided weakness.  Patient was on Lipitor as an outpatient which was being held  for elevated liver enzymes.   Left hydronephrosis with ureteral  stone. -Outpatient follow-up with urology for shockwave lithotripsy.   Hyponatremia Improved   Depression Continue with Prozac daily.           DVT prophylaxis: Eliquis.  Code Status: (Full code) Family Communication: Family at bedside.  Disposition:   Status is: Inpatient  Remains inpatient appropriate because:Unsafe d/c plan   Dispo:  Patient From: Home  Planned Disposition: [facility]  Expected discharge date: 06/22/2020  Medically stable for discharge:  YES        Consultants:   [place].    Procedures:   VATS/DECORTICATION/PEG PLACEMENT/ESOPHAGEAL STENT PLACEMENT (05/17/2020)  RIGHT THORACOSCOPY/SOPHAGOGASTROSCOPY/ESOPHAGEAL STENT PLACEMENT/ESOPHAGRAM   Antimicrobials:  Anti-infectives (From admission, onward)   Start     Dose/Rate Route Frequency Ordered Stop   06/03/20 0200  vancomycin (VANCOCIN) IVPB 1000 mg/200 mL premix  Status:  Discontinued        1,000 mg 200 mL/hr over 60 Minutes Intravenous Every 12 hours 06/02/20 1248 06/05/20 0915   06/02/20 2200  ceFEPIme (MAXIPIME) 2 g in sodium chloride 0.9 % 100 mL IVPB        2 g 200 mL/hr over 30 Minutes Intravenous Every 8 hours 06/02/20 1251 06/08/20 2359   06/02/20 2000  piperacillin-tazobactam (ZOSYN) IVPB 3.375 g  Status:  Discontinued        3.375 g 12.5 mL/hr over 240 Minutes Intravenous Every 8 hours 06/02/20 1248 06/02/20 1249   06/02/20 1330  vancomycin (VANCOREADY) IVPB 1250 mg/250 mL        1,250 mg 166.7 mL/hr over 90 Minutes Intravenous  Once 06/02/20 1248 06/02/20 1646   06/02/20 1330  piperacillin-tazobactam (ZOSYN) IVPB 3.375 g  Status:  Discontinued        3.375 g 100 mL/hr over 30 Minutes Intravenous  Once 06/02/20 1248 06/02/20 1249   06/02/20 1215  vancomycin (VANCOCIN) IVPB 1000 mg/200 mL premix  Status:  Discontinued        1,000 mg 200 mL/hr over 60 Minutes Intravenous  Once 06/02/20 1202 06/02/20 1251   06/02/20 1215  ceFEPIme (MAXIPIME)  2 g in sodium chloride 0.9 % 100 mL IVPB        2 g 200 mL/hr over 30 Minutes Intravenous  Once 06/02/20 1202 06/02/20 1506   05/29/20 1800  vancomycin (VANCOCIN) IVPB 1000 mg/200 mL premix  Status:  Discontinued        1,000 mg 200 mL/hr over 60 Minutes Intravenous Every 12 hours 05/29/20 1151 05/30/20 0912   05/24/20 2100  vancomycin (VANCOREADY) IVPB 750 mg/150 mL  Status:  Discontinued        750 mg 150 mL/hr over 60 Minutes Intravenous Every 12 hours 05/24/20 0813 05/29/20 1151   05/24/20 0900  vancomycin (VANCOREADY) IVPB 1500 mg/300 mL        1,500 mg 150 mL/hr over 120 Minutes Intravenous  Once 05/24/20 0813 05/24/20 1221   05/17/20 0900  fluconazole (DIFLUCAN) IVPB 400 mg  Status:  Discontinued        400 mg 100 mL/hr over 120 Minutes Intravenous Every 24 hours 05/17/20 0749 06/05/20 0915   05/17/20 0600  vancomycin (VANCOREADY) IVPB 750 mg/150 mL  Status:  Discontinued        750 mg 150 mL/hr over 60 Minutes Intravenous Every 8 hours 05/16/20 2154 05/17/20 0903   05/17/20 0400  vancomycin (VANCOREADY) IVPB 750 mg/150 mL  Status:  Discontinued        750 mg  150 mL/hr over 60 Minutes Intravenous Every 8 hours 05/16/20 1945 05/16/20 2154   05/16/20 2200  vancomycin (VANCOREADY) IVPB 1250 mg/250 mL        1,250 mg 166.7 mL/hr over 90 Minutes Intravenous STAT 05/16/20 2153 05/16/20 2348   05/16/20 2000  vancomycin (VANCOREADY) IVPB 1250 mg/250 mL  Status:  Discontinued        1,250 mg 166.7 mL/hr over 90 Minutes Intravenous  Once 05/16/20 1945 05/16/20 2153   05/15/20 1600  piperacillin-tazobactam (ZOSYN) IVPB 3.375 g  Status:  Discontinued        3.375 g 12.5 mL/hr over 240 Minutes Intravenous Every 8 hours 05/15/20 0945 05/30/20 0912   05/15/20 0930  piperacillin-tazobactam (ZOSYN) IVPB 3.375 g        3.375 g 100 mL/hr over 30 Minutes Intravenous  Once 05/15/20 0921 05/15/20 1009   05/13/20 2000  azithromycin (ZITHROMAX) tablet 500 mg        500 mg Oral Daily 05/13/20 1155  05/14/20 2127   05/10/20 2200  cefTRIAXone (ROCEPHIN) 2 g in sodium chloride 0.9 % 100 mL IVPB  Status:  Discontinued        2 g 200 mL/hr over 30 Minutes Intravenous Every 24 hours 05/10/20 2148 05/15/20 0911   05/10/20 2200  azithromycin (ZITHROMAX) 500 mg in sodium chloride 0.9 % 250 mL IVPB  Status:  Discontinued        500 mg 250 mL/hr over 60 Minutes Intravenous Every 24 hours 05/10/20 2148 05/13/20 1155        Subjective: No chest pain and sob.   Objective: Vitals:   06/20/20 0713 06/20/20 1000 06/20/20 1050 06/20/20 1110  BP: 121/90 (!) 132/94 (!) 139/92   Pulse:    (!) 119  Resp:      Temp:      TempSrc:      SpO2:      Weight:      Height:        Intake/Output Summary (Last 24 hours) at 06/20/2020 1206 Last data filed at 06/20/2020 0509 Gross per 24 hour  Intake 1380 ml  Output 500 ml  Net 880 ml   Filed Weights   06/18/20 0300 06/19/20 0332 06/20/20 0333  Weight: 58.8 kg 59.2 kg 59.2 kg    Examination:  General exam: Appears calm and comfortable  Respiratory system: diminished air entry at bases, no  Wheezing heard.  Cardiovascular system: S1 & S2 heard, RRR. No JVD, murmurs, . No pedal edema. Gastrointestinal system: Abdomen is nondistended, soft and nontender.Normal bowel sounds heard. Central nervous system: Alert and oriented. No focal neurological deficits. Extremities: Symmetric 5 x 5 power. Skin: No rashes, lesions or ulcers Psychiatry:Mood & affect appropriate.     Data Reviewed: I have personally reviewed following labs and imaging studies  CBC: Recent Labs  Lab 06/14/20 0124 06/17/20 0951 06/18/20 1209 06/20/20 0801  WBC 10.4 12.3* 11.0* 10.4  HGB 9.6* 8.9* 9.4* 9.1*  HCT 33.0* 30.3* 31.7* 30.8*  MCV 83.3 81.9 82.1 81.1  PLT 491* 411* 405* 434*    Basic Metabolic Panel: Recent Labs  Lab 06/14/20 0124 06/17/20 0951 06/19/20 0146 06/20/20 0801  NA 132* 130* 133* 133*  K 4.5 4.3 4.7 4.1  CL 95* 95* 97* 97*  CO2 28 25 29 26     GLUCOSE 139* 145* 132* 157*  BUN 14 12 12 11   CREATININE 0.81 0.69 0.68 0.65  CALCIUM 9.0 8.5* 8.9 8.8*    GFR: Estimated Creatinine Clearance: 82.2  mL/min (by C-G formula based on SCr of 0.65 mg/dL).  Liver Function Tests: Recent Labs  Lab 06/14/20 0124 06/17/20 0951 06/20/20 0801  AST 52* 55* 61*  ALT 132* 154* 198*  ALKPHOS 148* 142* 145*  BILITOT 0.4 0.5 0.4  PROT 7.7 7.5 7.4  ALBUMIN 2.3* 2.1* 2.2*    CBG: Recent Labs  Lab 06/19/20 1133 06/19/20 1609 06/19/20 2134 06/20/20 0614 06/20/20 1057  GLUCAP 132* 114* 143* 130* 146*     Recent Results (from the past 240 hour(s))  Culture, blood (routine x 2)     Status: None   Collection Time: 06/10/20  1:37 PM   Specimen: BLOOD RIGHT HAND  Result Value Ref Range Status   Specimen Description BLOOD RIGHT HAND  Final   Special Requests   Final    BOTTLES DRAWN AEROBIC AND ANAEROBIC Blood Culture adequate volume   Culture   Final    NO GROWTH 5 DAYS Performed at [facility], [place] [address], [place], [place] [address]    Report Status 06/15/2020 FINAL  Final  Culture, blood (routine x 2)     Status: None   Collection Time: 06/10/20  1:43 PM   Specimen: BLOOD LEFT HAND  Result Value Ref Range Status   Specimen Description BLOOD LEFT HAND  Final   Special Requests   Final    BOTTLES DRAWN AEROBIC ONLY Blood Culture results may not be optimal due to an inadequate volume of blood received in culture bottles   Culture   Final    NO GROWTH 5 DAYS Performed at [facility], [place] [address] [place], [place] [address]    Report Status 06/15/2020 FINAL  Final  SARS Coronavirus 2 by RT PCR (hospital order, performed in [facility] hospital lab) Nasopharyngeal Nasopharyngeal Swab     Status: None   Collection Time: 06/11/20  1:43 PM   Specimen: Nasopharyngeal Swab  Result Value Ref Range Status   SARS Coronavirus 2 NEGATIVE NEGATIVE Final    Comment: (NOTE) SARS-CoV-2 target nucleic acids are NOT  DETECTED.  The SARS-CoV-2 RNA is generally detectable in upper and lower respiratory specimens during the acute phase of infection. The lowest concentration of SARS-CoV-2 viral copies this assay can detect is 250 copies / mL. A negative result does not preclude SARS-CoV-2 infection and should not be used as the sole basis for treatment or other patient management decisions.  A negative result may occur with improper specimen collection / handling, submission of specimen other than nasopharyngeal swab, presence of viral mutation(s) within the areas targeted by this assay, and inadequate number of viral copies (<250 copies / mL). A negative result must be combined with clinical observations, patient history, and epidemiological information.  Fact Sheet for Patients:   StrictlyIdeas.no  Fact Sheet for Healthcare Providers: BankingDealers.co.za  This test is not yet approved or  cleared by the [place] FDA and has been authorized for detection and/or diagnosis of SARS-CoV-2 by FDA under an Emergency Use Authorization (EUA).  This EUA will remain in effect (meaning this test can be used) for the duration of the COVID-19 declaration under Section 564(b)(1) of the Act, 21 U.S.C. section 360bbb-3(b)(1), unless the authorization is terminated or revoked sooner.  Performed at [facility], [place] [address], [place], [place] [address]   Culture, blood (routine x 2)     Status: None (Preliminary result)   Collection Time: 06/19/20  2:30 PM   Specimen: BLOOD LEFT ARM  Result Value Ref Range Status   Specimen Description BLOOD LEFT ARM  Final   Special Requests   Final    BOTTLES DRAWN AEROBIC AND ANAEROBIC Blood Culture adequate volume   Culture   Final    NO GROWTH < 24 HOURS Performed at [facility], [address], [place], [place] [address]    Report Status PENDING  Incomplete  Culture, blood (routine x 2)     Status:  None (Preliminary result)   Collection Time: 06/19/20  2:35 PM   Specimen: BLOOD RIGHT HAND  Result Value Ref Range Status   Specimen Description BLOOD RIGHT HAND  Final   Special Requests   Final    BOTTLES DRAWN AEROBIC AND ANAEROBIC Blood Culture adequate volume   Culture   Final    NO GROWTH < 24 HOURS Performed at [facility], [place] [address] [place], [place] [address]    Report Status PENDING  Incomplete         Radiology Studies: CT CHEST W CONTRAST  Result Date: 06/19/2020 CLINICAL DATA:  Pneumonia.  Effusion or abscess suspected. EXAM: CT CHEST WITH CONTRAST TECHNIQUE: Multidetector CT imaging of the chest was performed during intravenous contrast administration. CONTRAST:  1mL OMNIPAQUE IOHEXOL 300 MG/ML  SOLN COMPARISON:  Chest radiographs 06/18/2020, CT chest 06/02/2020 FINDINGS: Cardiovascular: The heart size is upper limits of normal, similar to prior. Coronary artery calcifications. No evidence of pericardial effusion or aortic aneurysm. Mediastinum/Nodes: Redemonstrated esophageal stent with debris/fluid layering within the esophagus. There is stranding of the posterior mediastinal fat adjacent to the esophageal stent. No discrete fluid collection identified within the mediastinum. Previously described the described gas along the right aspect of the stent has resolved. There is a small amount of air adjacent to the left aspect of the stent distally and along the right proximal aspect of the stent. Mildly prominent mediastinal and right hilar lymph nodes, most likely reactive. Lungs/Pleura: Interval increase in a posteromedial loculated right pleural collection, which now measures up to 7.8 by 5.0 cm. The collection has a few punctate areas of internal gas surrounding pleura is enhancing. Overlying linear opacities, compatible with compressive atelectasis. Otherwise, the lungs are clear. Upper Abdomen: Probable moderate left hydronephrosis, partially imaged but grossly  similar to prior stress CT. Musculoskeletal: No chest wall abnormality. No acute or significant osseous findings. IMPRESSION: 1. Interval increase in a loculated right posteromedial pleural fluid collection with surrounding enhancing pleura, which is sterility indeterminate by imaging but possibly an empyema. 2. Redemonstrated esophageal stent with layering debris/fluid. Stranding in the surrounding posterior mediastinum could be postsurgical or relate to persistent esophageal leak and/or mediastinitis. No discrete drainable mediastinal fluid collection. 3. Similar small right pleural effusion with overlying compressive atelectasis. Electronically Signed   By: Margaretha Sheffield MD   On: 06/19/2020 10:31        Scheduled Meds: . apixaban  5 mg Oral BID  . feeding supplement (PROSource TF)  45 mL Per Tube BID  . FLUoxetine  20 mg Per Tube Daily  . insulin aspart  0-24 Units Subcutaneous TID AC & HS  . mouth rinse  15 mL Mouth Rinse q12n4p  . metoprolol tartrate  75 mg Per Tube BID  . pantoprazole sodium  40 mg Per Tube BID  . sennosides  5 mL Per Tube QHS  . traZODone  50 mg Per Tube QHS   Continuous Infusions: . sodium chloride 1,000 mL (06/06/20 0028)  . sodium chloride    .  feeding supplement (OSMOLITE 1.5 CAL) 1,000 mL (06/18/20 1649)     LOS: 40 days        Hosie Poisson, MD [facility]   To contact the attending provider between 7A-7P or the covering provider during after hours 7P-7A, please log into the web site [email or web address] and access using universal [place] password for that web site. If you do not have the password, please call the hospital operator.  06/20/2020, 12:06 PM

## 2020-06-20 NOTE — Plan of Care (Signed)

## 2020-06-20 NOTE — Progress Notes (Signed)
     [address]       [place] [address]             [phone]       Mr. Follow-up is well-known to our service.  He underwent a right thoracoscopy with decortication, PEG tube placement and esophageal stent placement for an esophageal perforation that occurred in an outside hospital.  Unfortunately he was treated for community-acquired pneumonia for over a week prior to presenting, he also has a history of a stroke.  The esophageal stent was placed on 8/16, and chest tubes were subsequently removed.  From a clinical standpoint he has remained stable.  He always has a baseline tachycardia, and is occasionally short of breath from his decortication.  I personally reviewed his CT scan and he does have a slightly enlarging right-sided effusion but this is likely due to to have some residual entrapped lung and poor pulmonary toilet.  He has not worked much with physical therapy, and has been recommended for nursing home placement.  On previous conversations with his mother he has been debilitated from his stroke mostly from a cognitive standpoint.  In regards to his pleural effusion I think that this is all related to some residual and trapped lung.  His white count has been stable.  I do not think that there is any need for further surgery or drainage.  He requires aggressive physical therapy which she is been noncompliant with.  In regards to his esophageal perforation his stent will be removed the week of September 16 and we will evaluate for healing.  He has not taken any p.o. despite being cleared for liquid diet, but his tube feeds have been at goal.  Lajuana Matte

## 2020-06-20 NOTE — Progress Notes (Signed)
CSW spoke with adm dir at [facility] who confirmed there are still no beds available for pt at either facility.

## 2020-06-21 LAB — GLUCOSE, CAPILLARY
Glucose-Capillary: 133 mg/dL — ABNORMAL HIGH (ref 70–99)
Glucose-Capillary: 149 mg/dL — ABNORMAL HIGH (ref 70–99)
Glucose-Capillary: 129 mg/dL — ABNORMAL HIGH (ref 70–99)
Glucose-Capillary: 126 mg/dL — ABNORMAL HIGH (ref 70–99)

## 2020-06-21 LAB — SARS CORONAVIRUS 2 BY RT PCR (HOSPITAL ORDER, PERFORMED IN ~~LOC~~ HOSPITAL LAB): SARS Coronavirus 2: NEGATIVE

## 2020-06-21 MED ORDER — METOPROLOL TARTRATE 25 MG/10 ML ORAL SUSPENSION
100.0000 mg | Freq: Two times a day (BID) | ORAL | Status: DC
  Administered 2020-06-21 – 2020-07-12 (×42): 100 mg
  Filled 2020-06-21 (×44): qty 40

## 2020-06-21 NOTE — Progress Notes (Signed)
Pt with RED MEWS. Charge nurse notified of change. PRN med given. Increase vital sigh frequency Q1 hr times 4. Will continue to monitor.

## 2020-06-21 NOTE — Plan of Care (Signed)

## 2020-06-21 NOTE — Progress Notes (Addendum)
CSW spoke with family in reference to bed offer for [facility], family is ok with placement, confirmed with facility as well that bed is available. Pt is vaccinated, mom has card. Expected dc is tomorrow morning.

## 2020-06-21 NOTE — Progress Notes (Signed)
PROGRESS NOTE    Evan Chavez  NLG:[id number] DOB: October 03, 1961 DOA: 05/10/2020 PCP: Rosita Fire, MD    Chief Complaint  Patient presents with  . Shortness of Breath    Brief Narrative:  59 year old gentleman with prior history of GERD, Barrett's esophagus, peptic ulcer disease with extensive gastric erosion, duodenal ulcer and esophageal stricture s/p balloon dilatation on 3/2 0 20, CVA, hyperlipidemia, BPH, remote cocaine abuse presents with weakness productive cough, fever and dyspnea was found to have community-acquired pneumonia.Hospital course complicated by right sided hydropneumothorax with possible lung collapse s/p thoracentesis on the right on 05/14/2020, esophageal perforation with right sided hydropneumothorax, complicated by aspiration pneumonia. Patient underwent right sided chest tube placement, transferred from [facility] to [facility] for VATS and decortication by CT surgery as well as esophageal stent placement and PEG placement on 05/16/20. Patient had episodes of persistent leakage from esophagus on esophagogram as well as underwent drainage of loculated pleural effusion on 05/25/20 by CT surgery. Repeat esophagogram on 05/28/2020 did not show any esophageal leak. CT chest with contrast on 9/7 showed Interval increase in a loculated right posteromedial pleural fluid collection with surrounding enhancing pleura, which is sterility indeterminate by imaging but possibly an empyema. CT surgery re consulted, suggested that residual entrapped lung and poor pulmonary toilet. No need for further surgery or drainage.   pt seen and examined at bedside, he is on RA and denies any new complaints.   Assessment & Plan:   Active Problems:   Essential hypertension   Atrial tachycardia (HCC)   Dyslipidemia   CAP (community acquired pneumonia)   Transaminitis   Lobar pneumonia ([place])   Sepsis due to undetermined organism ([place])   Fever   Gastroesophageal reflux disease with esophagitis   Shortness of  breath   Thyroid nodule   BPH (benign prostatic hyperplasia)   Loculated pleural effusion   Ureteral stone with hydronephrosis   Pneumothorax on right   Acute respiratory failure with hypoxia (HCC)   Chronic bilateral pleural effusions   Parapneumonic effusion   Malnutrition of moderate degree   Esophageal perforation   Pressure injury of skin   Atrial fibrillation with RVR (HCC)   Esophageal anastomotic leak   Atelectasis   Right lower lobe pulmonary infiltrate   Sinus tachycardia   Dysphagia   Respiratory distress   Status post thoracentesis   Acute respiratory failure with hypoxia secondary to community-acquired pneumonia, Parapneumonic effusion. Patient underwent chest tube placement initially by cardiothoracic surgery.  Pleural fluid culture significant for Candida parapsilosis.  He completed the course of cefepime and fluconazole. Currently pt is on RA , with good sats.  No new complaints.    Hydropneumothorax s/p chest tube placement by CT surgery.  Resolved.    Sepsis present on admission:  In the setting of aspiration pneumonia, empyema and mediastinitis. Patient completed the course of cefepime. Patient continues to have some residual tachycardia.increased the dose of metoprolol 100 mg BID.     Esophageal rupture s/p stent placement by CT surgery.   Oropharyngeal dysphagia s/p PEG placement on tube feeds.  SLP evaluation recommending clear liquid diet for now    History of paroxysmal atrial fibrillation CURRENTLY he is in sinus tachycardia.  CT angiogram is negative for PE. Continue with metoprolol for rate control and Eliquis for anticoagulation . Increased the metoprolol to 100 mg BID for better rate control.    Acute on chronic blood loss anemia in the setting of blood loss from esophageal rupture/postop anemia in addition to  hematemesis which have all resolved.  Currently hemoglobin is stable.   Chronic sinus tachycardia Heart rates between 90s  to 120s. Increase metoprolol to 100 mg BID.     History of stroke with residual right-sided weakness.   Patient was on Lipitor as an outpatient which was being held for elevated liver enzymes. Recheck liver enzymes in 2 weeks and restart lipitor   Left hydronephrosis with ureteral stone. -Outpatient follow-up with urology for shockwave lithotripsy.   Hyponatremia Improved   Depression Continue with Prozac daily.           DVT prophylaxis: Eliquis.  Code Status: (Full code) Family Communication: Family at bedside.  Disposition:   Status is: Inpatient  Remains inpatient appropriate because:Unsafe d/c plan   Dispo:  Patient From: Home  Planned Disposition: [place]  Expected discharge date: 06/22/2020  Medically stable for discharge:  YES        Consultants:   [place].    Procedures:   VATS/DECORTICATION/PEG PLACEMENT/ESOPHAGEAL STENT PLACEMENT (05/17/2020)  RIGHT THORACOSCOPY/SOPHAGOGASTROSCOPY/ESOPHAGEAL STENT PLACEMENT/ESOPHAGRAM   Antimicrobials:  Anti-infectives (From admission, onward)   Start     Dose/Rate Route Frequency Ordered Stop   06/03/20 0200  vancomycin (VANCOCIN) IVPB 1000 mg/200 mL premix  Status:  Discontinued        1,000 mg 200 mL/hr over 60 Minutes Intravenous Every 12 hours 06/02/20 1248 06/05/20 0915   06/02/20 2200  ceFEPIme (MAXIPIME) 2 g in sodium chloride 0.9 % 100 mL IVPB        2 g 200 mL/hr over 30 Minutes Intravenous Every 8 hours 06/02/20 1251 06/08/20 2359   06/02/20 2000  piperacillin-tazobactam (ZOSYN) IVPB 3.375 g  Status:  Discontinued        3.375 g 12.5 mL/hr over 240 Minutes Intravenous Every 8 hours 06/02/20 1248 06/02/20 1249   06/02/20 1330  vancomycin (VANCOREADY) IVPB 1250 mg/250 mL        1,250 mg 166.7 mL/hr over 90 Minutes Intravenous  Once 06/02/20 1248 06/02/20 1646   06/02/20 1330  piperacillin-tazobactam (ZOSYN) IVPB 3.375 g  Status:  Discontinued        3.375  g 100 mL/hr over 30 Minutes Intravenous  Once 06/02/20 1248 06/02/20 1249   06/02/20 1215  vancomycin (VANCOCIN) IVPB 1000 mg/200 mL premix  Status:  Discontinued        1,000 mg 200 mL/hr over 60 Minutes Intravenous  Once 06/02/20 1202 06/02/20 1251   06/02/20 1215  ceFEPIme (MAXIPIME) 2 g in sodium chloride 0.9 % 100 mL IVPB        2 g 200 mL/hr over 30 Minutes Intravenous  Once 06/02/20 1202 06/02/20 1506   05/29/20 1800  vancomycin (VANCOCIN) IVPB 1000 mg/200 mL premix  Status:  Discontinued        1,000 mg 200 mL/hr over 60 Minutes Intravenous Every 12 hours 05/29/20 1151 05/30/20 0912   05/24/20 2100  vancomycin (VANCOREADY) IVPB 750 mg/150 mL  Status:  Discontinued        750 mg 150 mL/hr over 60 Minutes Intravenous Every 12 hours 05/24/20 0813 05/29/20 1151   05/24/20 0900  vancomycin (VANCOREADY) IVPB 1500 mg/300 mL        1,500 mg 150 mL/hr over 120 Minutes Intravenous  Once 05/24/20 0813 05/24/20 1221   05/17/20 0900  fluconazole (DIFLUCAN) IVPB 400 mg  Status:  Discontinued        400 mg 100 mL/hr over 120 Minutes Intravenous Every 24 hours 05/17/20 0749 06/05/20 0915  05/17/20 0600  vancomycin (VANCOREADY) IVPB 750 mg/150 mL  Status:  Discontinued        750 mg 150 mL/hr over 60 Minutes Intravenous Every 8 hours 05/16/20 2154 05/17/20 0903   05/17/20 0400  vancomycin (VANCOREADY) IVPB 750 mg/150 mL  Status:  Discontinued        750 mg 150 mL/hr over 60 Minutes Intravenous Every 8 hours 05/16/20 1945 05/16/20 2154   05/16/20 2200  vancomycin (VANCOREADY) IVPB 1250 mg/250 mL        1,250 mg 166.7 mL/hr over 90 Minutes Intravenous STAT 05/16/20 2153 05/16/20 2348   05/16/20 2000  vancomycin (VANCOREADY) IVPB 1250 mg/250 mL  Status:  Discontinued        1,250 mg 166.7 mL/hr over 90 Minutes Intravenous  Once 05/16/20 1945 05/16/20 2153   05/15/20 1600  piperacillin-tazobactam (ZOSYN) IVPB 3.375 g  Status:  Discontinued        3.375 g 12.5 mL/hr over 240 Minutes Intravenous  Every 8 hours 05/15/20 0945 05/30/20 0912   05/15/20 0930  piperacillin-tazobactam (ZOSYN) IVPB 3.375 g        3.375 g 100 mL/hr over 30 Minutes Intravenous  Once 05/15/20 0921 05/15/20 1009   05/13/20 2000  azithromycin (ZITHROMAX) tablet 500 mg        500 mg Oral Daily 05/13/20 1155 05/14/20 2127   05/10/20 2200  cefTRIAXone (ROCEPHIN) 2 g in sodium chloride 0.9 % 100 mL IVPB  Status:  Discontinued        2 g 200 mL/hr over 30 Minutes Intravenous Every 24 hours 05/10/20 2148 05/15/20 0911   05/10/20 2200  azithromycin (ZITHROMAX) 500 mg in sodium chloride 0.9 % 250 mL IVPB  Status:  Discontinued        500 mg 250 mL/hr over 60 Minutes Intravenous Every 24 hours 05/10/20 2148 05/13/20 1155       Subjective: Pt nodding when he was asked. He appears comfortable, denies any new complaints.   Objective: Vitals:   06/21/20 0844 06/21/20 1012 06/21/20 1102 06/21/20 1200  BP: 127/83 133/81 125/86 127/86  Pulse: (!) 116 (!) 123 (!) 109   Resp: 20  20   Temp: 98.3 F (36.8 C)  98.3 F (36.8 C)   TempSrc: Oral  Oral   SpO2: 97%  98%   Weight:      Height:        Intake/Output Summary (Last 24 hours) at 06/21/2020 1424 Last data filed at 06/21/2020 0853 Gross per 24 hour  Intake 120 ml  Output 375 ml  Net -255 ml   Filed Weights   06/19/20 0332 06/20/20 0333 06/21/20 5701  Weight: 59.2 kg 59.2 kg 59.2 kg    Examination:  General exam: Elderly gentleman alert and comfortable not in any kind of distress. Respiratory system: Diminished air entry at bases, scattered rhonchi, no tachypnea Cardiovascular system: S1-S2 heard, tachycardia, no JVD no pedal edema Gastrointestinal system: Abdomen is soft, nontender, nondistended bowel sounds normal, PEG in place. Central nervous system: Alert and answering some questions, following commands, Extremities: No cyanosis or clubbing Skin: No rashes seen Psychiatry: Mood is appropriate    Data Reviewed: I have personally reviewed  following labs and imaging studies  CBC: Recent Labs  Lab 06/17/20 0951 06/18/20 1209 06/20/20 0801  WBC 12.3* 11.0* 10.4  HGB 8.9* 9.4* 9.1*  HCT 30.3* 31.7* 30.8*  MCV 81.9 82.1 81.1  PLT 411* 405* 434*    Basic Metabolic Panel: Recent Labs  Lab 06/17/20  8372 06/19/20 0146 06/20/20 0801  NA 130* 133* 133*  K 4.3 4.7 4.1  CL 95* 97* 97*  CO2 25 29 26   GLUCOSE 145* 132* 157*  BUN 12 12 11   CREATININE 0.69 0.68 0.65  CALCIUM 8.5* 8.9 8.8*    GFR: Estimated Creatinine Clearance: 82.2 mL/min (by C-G formula based on SCr of 0.65 mg/dL).  Liver Function Tests: Recent Labs  Lab 06/17/20 0951 06/20/20 0801  AST 55* 61*  ALT 154* 198*  ALKPHOS 142* 145*  BILITOT 0.5 0.4  PROT 7.5 7.4  ALBUMIN 2.1* 2.2*    CBG: Recent Labs  Lab 06/20/20 1057 06/20/20 1555 06/20/20 2135 06/21/20 0636 06/21/20 1117  GLUCAP 146* 140* 138* 133* 149*     Recent Results (from the past 240 hour(s))  Culture, blood (routine x 2)     Status: None (Preliminary result)   Collection Time: 06/19/20  2:30 PM   Specimen: BLOOD LEFT ARM  Result Value Ref Range Status   Specimen Description BLOOD LEFT ARM  Final   Special Requests   Final    BOTTLES DRAWN AEROBIC AND ANAEROBIC Blood Culture adequate volume   Culture   Final    NO GROWTH 2 DAYS Performed at [facility], [place] [address], [place], [place] [address]    Report Status PENDING  Incomplete  Culture, blood (routine x 2)     Status: None (Preliminary result)   Collection Time: 06/19/20  2:35 PM   Specimen: BLOOD RIGHT HAND  Result Value Ref Range Status   Specimen Description BLOOD RIGHT HAND  Final   Special Requests   Final    BOTTLES DRAWN AEROBIC AND ANAEROBIC Blood Culture adequate volume   Culture   Final    NO GROWTH 2 DAYS Performed at [facility], [place] [address] [place], [place] [address]    Report Status PENDING  Incomplete         Radiology Studies: No results found.      Scheduled  Meds: . apixaban  5 mg Oral BID  . feeding supplement (PROSource TF)  45 mL Per Tube BID  . FLUoxetine  20 mg Per Tube Daily  . insulin aspart  0-24 Units Subcutaneous TID AC & HS  . mouth rinse  15 mL Mouth Rinse q12n4p  . metoprolol tartrate  75 mg Per Tube BID  . pantoprazole sodium  40 mg Per Tube BID  . sennosides  5 mL Per Tube QHS  . traZODone  50 mg Per Tube QHS   Continuous Infusions: . sodium chloride 1,000 mL (06/06/20 0028)  . sodium chloride    . feeding supplement (OSMOLITE 1.5 CAL) 60 mL/hr at 06/21/20 0853     LOS: 41 days        Hosie Poisson, MD [facility]   To contact the attending provider between 7A-7P or the covering provider during after hours 7P-7A, please log into the web site [email or web address] and access using universal [place] password for that web site. If you do not have the password, please call the hospital operator.  06/21/2020, 2:24 PM

## 2020-06-21 NOTE — Plan of Care (Signed)

## 2020-06-22 ENCOUNTER — Inpatient Hospital Stay (HOSPITAL_COMMUNITY): Payer: Medicaid Other

## 2020-06-22 LAB — GLUCOSE, CAPILLARY
Glucose-Capillary: 152 mg/dL — ABNORMAL HIGH (ref 70–99)
Glucose-Capillary: 130 mg/dL — ABNORMAL HIGH (ref 70–99)
Glucose-Capillary: 114 mg/dL — ABNORMAL HIGH (ref 70–99)
Glucose-Capillary: 115 mg/dL — ABNORMAL HIGH (ref 70–99)
Glucose-Capillary: 109 mg/dL — ABNORMAL HIGH (ref 70–99)

## 2020-06-22 LAB — BASIC METABOLIC PANEL
Anion gap: 9 (ref 5–15)
BUN: 12 mg/dL (ref 6–20)
CO2: 30 mmol/L (ref 22–32)
Calcium: 8.9 mg/dL (ref 8.9–10.3)
Chloride: 94 mmol/L — ABNORMAL LOW (ref 98–111)
Creatinine, Ser: 0.85 mg/dL (ref 0.61–1.24)
GFR calc Af Amer: >60 mL/min (ref >60)
GFR calc non Af Amer: >60 mL/min (ref >60)
Glucose, Bld: 141 mg/dL — ABNORMAL HIGH (ref 70–99)
Potassium: 4.6 mmol/L (ref 3.5–5.1)
Sodium: 133 mmol/L — ABNORMAL LOW (ref 135–145)

## 2020-06-22 LAB — CBC WITH DIFFERENTIAL/PLATELET
Abs Immature Granulocytes: 0.06 10*3/uL (ref 0.00–0.07)
Basophils Absolute: 0.1 10*3/uL (ref 0.0–0.1)
Basophils Relative: 0 %
Eosinophils Absolute: 0.1 10*3/uL (ref 0.0–0.5)
Eosinophils Relative: 0 %
HCT: 32.2 % — ABNORMAL LOW (ref 39.0–52.0)
Hemoglobin: 9.5 g/dL — ABNORMAL LOW (ref 13.0–17.0)
Immature Granulocytes: 0 %
Lymphocytes Relative: 9 %
Lymphs Abs: 1.2 10*3/uL (ref 0.7–4.0)
MCH: 24.2 pg — ABNORMAL LOW (ref 26.0–34.0)
MCHC: 29.5 g/dL — ABNORMAL LOW (ref 30.0–36.0)
MCV: 82.1 fL (ref 80.0–100.0)
Monocytes Absolute: 1.2 10*3/uL — ABNORMAL HIGH (ref 0.1–1.0)
Monocytes Relative: 8 %
Neutro Abs: 11.9 10*3/uL — ABNORMAL HIGH (ref 1.7–7.7)
Neutrophils Relative %: 83 %
Platelets: 466 10*3/uL — ABNORMAL HIGH (ref 150–400)
RBC: 3.92 MIL/uL — ABNORMAL LOW (ref 4.22–5.81)
RDW: 16.1 % — ABNORMAL HIGH (ref 11.5–15.5)
WBC: 14.5 10*3/uL — ABNORMAL HIGH (ref 4.0–10.5)
nRBC: 0 % (ref 0.0–0.2)

## 2020-06-22 MED ORDER — DM-GUAIFENESIN ER 30-600 MG PO TB12: 1.0000 | ORAL_TABLET | Freq: Two times a day (BID) | ORAL | Status: DC

## 2020-06-22 MED ORDER — PROSOURCE TF PO LIQD: 45.0000 mL | Freq: Two times a day (BID) | ORAL | Status: AC

## 2020-06-22 MED ORDER — IOHEXOL 300 MG/ML  SOLN
100.0000 mL | Freq: Once | INTRAMUSCULAR | Status: AC | PRN
  Administered 2020-06-22: 100 mL via INTRAVENOUS

## 2020-06-22 MED ORDER — IPRATROPIUM-ALBUTEROL 0.5-2.5 (3) MG/3ML IN SOLN: 3.0000 mL | RESPIRATORY_TRACT | Status: AC | PRN

## 2020-06-22 MED ORDER — OSMOLITE 1.5 CAL PO LIQD: 1000.0000 mL | ORAL | 0 refills | Status: DC

## 2020-06-22 MED ORDER — SENNOSIDES 8.8 MG/5ML PO SYRP: 5.0000 mL | ORAL_SOLUTION | Freq: Every day | ORAL | 0 refills | Status: AC

## 2020-06-22 MED ORDER — GUAIFENESIN-DM 100-10 MG/5ML PO SYRP: 10.0000 mL | ORAL_SOLUTION | ORAL | Status: DC | PRN

## 2020-06-22 MED ORDER — METOPROLOL TARTRATE 25 MG/10 ML ORAL SUSPENSION: 100.0000 mg | Freq: Two times a day (BID) | ORAL | Status: AC

## 2020-06-22 MED ORDER — INSULIN ASPART 100 UNIT/ML ~~LOC~~ SOLN: 0.0000 [IU] | Freq: Three times a day (TID) | SUBCUTANEOUS | 11 refills | Status: DC

## 2020-06-22 MED ORDER — SODIUM CHLORIDE 0.9 % IV SOLN
2.0000 g | Freq: Three times a day (TID) | INTRAVENOUS | Status: DC
  Administered 2020-06-22 – 2020-06-25 (×9): 2 g via INTRAVENOUS
  Filled 2020-06-22 (×9): qty 2

## 2020-06-22 MED ORDER — TRAZODONE HCL 50 MG PO TABS: 50.0000 mg | ORAL_TABLET | Freq: Every day | ORAL | Status: AC

## 2020-06-22 MED ORDER — FLUOXETINE HCL 20 MG PO CAPS: 20.0000 mg | ORAL_CAPSULE | Freq: Every day | ORAL | 3 refills | Status: AC

## 2020-06-22 MED ORDER — APIXABAN 5 MG PO TABS
5.0000 mg | ORAL_TABLET | Freq: Two times a day (BID) | ORAL | Status: DC
Start: 2020-06-22 — End: 2020-07-11

## 2020-06-22 MED ORDER — IOHEXOL 9 MG/ML PO SOLN
500.0000 mL | ORAL | Status: AC
  Administered 2020-06-22: 500 mL via ORAL
  Administered 2020-06-22: 300 mL via ORAL

## 2020-06-22 MED FILL — SENNA 8.8 MG/5ML SYRP: 8.8 | 47 days supply | Qty: 237 | Fill #0

## 2020-06-22 MED FILL — ULTICARE INS 0.5 ML 30GX1/2: 30G X 1/2" | 25 days supply | Qty: 100 | Fill #0

## 2020-06-22 MED FILL — NovoLOG 100 UNIT/ML SOLN: 100 | 10 days supply | Qty: 10 | Fill #0

## 2020-06-22 NOTE — Progress Notes (Signed)
Pharmacy Antibiotic Note  Evan Chavez is a 59 y.o. male admitted on 05/10/2020 with lung abscess.  Pharmacy has been consulted for cefepime dosing. SCr stable 0.85.  Plan: Cefepime 2g IV q8h Monitor clinical progress, c/s, renal function F/u de-escalation plan/LOT  Height: 5\' 9"  (175.3 cm) Weight: 59.3 kg (130 lb 11.7 oz) IBW/kg (Calculated) : 70.7  Temp (24hrs), Avg:99.6 F (37.6 C), Min:98.1 F (36.7 C), Max:101.2 F (38.4 C)  Recent Labs  Lab 06/17/20 0951 06/18/20 1209 06/19/20 0146 06/20/20 0801 06/22/20 1444  WBC 12.3* 11.0*  --  10.4 14.5*  CREATININE 0.69  --  0.68 0.65 0.85    Estimated Creatinine Clearance: 77.5 mL/min (by C-G formula based on SCr of 0.85 mg/dL).    No Known Allergies   Arturo Morton, PharmD, BCPS Please check AMION for all [place] contact numbers Clinical Pharmacist 06/22/2020 8:19 PM

## 2020-06-22 NOTE — Plan of Care (Signed)
  Problem: Education: Goal: Knowledge of General Education information will improve Description: Including pain rating scale, medication(s)/side effects and non-pharmacologic comfort measures Outcome: Progressing   Problem: Clinical Measurements: Goal: Ability to maintain clinical measurements within normal limits will improve Outcome: Progressing   Problem: Clinical Measurements: Goal: Will remain free from infection Outcome: Progressing   Problem: Clinical Measurements: Goal: Diagnostic test results will improve Outcome: Progressing   Problem: Activity: Goal: Risk for activity intolerance will decrease Outcome: Progressing   Problem: Nutrition: Goal: Adequate nutrition will be maintained Outcome: Progressing   Problem: Pain Managment: Goal: General experience of comfort will improve Outcome: Progressing

## 2020-06-22 NOTE — Progress Notes (Signed)
Occupational Therapy Treatment Patient Details Name: Evan Chavez MRN: [id number] DOB: 08/25/1961 Today's Date: 06/22/2020    History of present illness Pt is 59 yo male presents to the hospital on 7/28 with generalized weakness, productive cough and low-grade fever. Pt is currently being treated for hydropneumothorax, Sepsis aspiration pneumonia and esophagal rupture, AKI and acute delirium. s/p R thoracentesis on 8/2. Chest tube placed 8/2-8/9. s/p VATS, decortication, EGD, PEG tube placement, and a esophageal stent on 8/9. PMH of GERD, Barrett's esophagus, PUD with extensive gastric erosion, duodenal ulcer and esophageal stricture, stroke, HLD, BPH, cocaine abuse. s/p Right thoracoscopy, chest tube placement, esophageal stent placement 8/13-8/20.   OT comments  This 59 yo male admitted with above presents to acute OT with minimal interaction today (only when spoken too, did not initiate any interaction), only 2 episodes of verbalization (otherwise just shook his head yes or no). He was able to stand to do 1/2 of his mouth care ( but was not thorough). He will continue to benefit from acute OT with follow up at at [facility]. Goals revised this session.  Follow Up Recommendations  SNF;Supervision/Assistance - 24 hour    Equipment Recommendations  Other (comment) (TBD Next venue)       Precautions / Restrictions Precautions Precautions: Fall Precaution Comments: PEG, right weakness Restrictions Weight Bearing Restrictions: No       Mobility Bed Mobility   Bed Mobility: Supine to Sit     Supine to sit: Min guard;HOB elevated     General bed mobility comments: Pt up in recliner upon my arrival  Transfers Overall transfer level: Needs assistance Equipment used: 1 person hand held assist Transfers: Sit to/from Stand Sit to Stand: Min guard         General transfer comment: Min Guard A for safety    Balance Overall balance assessment: Needs assistance Sitting-balance  support: No upper extremity supported;Feet supported Sitting balance-Leahy Scale: Good Sitting balance - Comments: static sitting without assist   Standing balance support: No upper extremity supported;During functional activity Standing balance-Leahy Scale: Fair Standing balance comment: standing to brush teeth                           ADL either performed or assessed with clinical judgement   ADL Overall ADL's : Needs assistance/impaired     Grooming: Oral care;Standing;Wash/dry face Grooming Details (indicate cue type and reason): Pt started brushing teeth but did not do so very thoroughly, encouraged him to do more because he had milky flem/film on in his mouth--but he said no; encouraged him some more but he would not continue. He did let me work on his mouth care a little bit more (he sat while I was doing this)             Lower Body Dressing: Moderate assistance Lower Body Dressing Details (indicate cue type and reason): min A sit<>stand. He was able to doff both socks (used his LUE only), he was able to donn his left sock one handed (left) but he did situtate sock with both hands before reaching down to doff sock only with left hand. He attempted the same donning with right sock, but was unsuccessful (had to get it started for him and then he completed the task)                     Vision Patient Visual Report: No change from baseline  Cognition Arousal/Alertness: Awake/alert Behavior During Therapy: Flat affect Overall Cognitive Status: Difficult to assess Area of Impairment: Following commands;Problem solving                   Current Attention Level: Sustained   Following Commands: Follows one step commands consistently     Problem Solving: Requires verbal cues;Slow processing General Comments: pt mostly non-verbal all session, the only thing he did say was "[place]" when asked where he is from and said "yes" to me on  one occassion when I asked him to tell me v. just shaking his head        Exercises General Exercises - Lower Extremity Long Arc Quad: AROM;Both;Seated;20 reps Hip Flexion/Marching: AROM;Both;Seated;20 reps Toe Raises: AROM;Both;Seated;20 reps Heel Raises: AROM;Both;20 reps;Seated           Pertinent Vitals/ Pain       Pain Assessment: No/denies pain         Frequency  Min 2X/week        Progress Toward Goals  OT Goals(current goals can now be found in the care plan section)  Progress towards OT goals: Not progressing toward goals - comment (flat affect today with no interaction unless spoken to first (then limited interaction))  Acute Rehab OT Goals Patient Stated Goal: mother in room wants him to be able to go to rehab and get better OT Goal Formulation: With family Time For Goal Achievement: 07/06/20 Potential to Achieve Goals: Good ADL Goals Pt Will Perform Grooming: with supervision;standing Pt Will Perform Lower Body Dressing: with set-up;with supervision;sit to/from stand Pt Will Transfer to Toilet: with supervision;ambulating;regular height toilet;grab bars Additional ADL Goal #1: Pt will be Min VCs to follow energy conservation stratgies with basic ADLs  Plan Discharge plan remains appropriate       AM-PAC OT "6 Clicks" Daily Activity     Outcome Measure   Help from another person eating meals?: A Lot (pt not initiating self feeding) Help from another person taking care of personal grooming?: A Lot (decreased throughness) Help from another person toileting, which includes using toliet, bedpan, or urinal?: A Lot Help from another person bathing (including washing, rinsing, drying)?: A Lot Help from another person to put on and taking off regular upper body clothing?: A Lot Help from another person to put on and taking off regular lower body clothing?: A Lot 6 Click Score: 12    End of Session    OT Visit Diagnosis: Unsteadiness on feet (R26.81);Other  abnormalities of gait and mobility (R26.89);Muscle weakness (generalized) (M62.81);Other symptoms and signs involving cognitive function;Feeding difficulties (R63.3)   Activity Tolerance Patient tolerated treatment well (limited by not wanting to initiate interaction and limited follow through with interaction initiated by others)   Patient Left in chair;with call bell/phone within reach;with chair alarm set   Nurse Communication          Time: [phone] OT Time Calculation (min): 24 min  Charges: OT General Charges $OT Visit: 1 Visit OT Treatments $Self Care/Home Management : 23-37 mins  [place], OTR/L Acute [employer] Pager [phone] Office [phone]     Almon Register 06/22/2020, 1:23 PM

## 2020-06-22 NOTE — Progress Notes (Signed)
RT attempted CPT with patient, patient stated that he was having chest pains. RN informed about patient's complaint. CPT held at this time.

## 2020-06-22 NOTE — Progress Notes (Signed)
Physical Therapy Treatment Patient Details Name: Evan Chavez MRN: [id number] DOB: 06/15/1961 Today's Date: 06/22/2020    History of Present Illness Pt is 59 yo male presents to the hospital on 7/28 with generalized weakness, productive cough and low-grade fever. Pt is currently being treated for hydropneumothorax, Sepsis aspiration pneumonia and esophagal rupture, AKI and acute delirium. s/p R thoracentesis on 8/2. Chest tube placed 8/2-8/9. s/p VATS, decortication, EGD, PEG tube placement, and a esophageal stent on 8/9. PMH of GERD, Barrett's esophagus, PUD with extensive gastric erosion, duodenal ulcer and esophageal stricture, stroke, HLD, BPH, cocaine abuse. s/p Right thoracoscopy, chest tube placement, esophageal stent placement 8/13-8/20.    PT Comments    Pt supine on arrival, nonverbal throughout session with minimal communication with head nodding/shaking. Pt with improved transfers from last session with this therapist but declined increased gait distance. Pt remains unable to manage secretions with mobility and needs cues for activity, progression and safety.     Follow Up Recommendations  SNF     Equipment Recommendations  3in1 (PT);Rolling walker with 5" wheels    Recommendations for Other Services       Precautions / Restrictions Precautions Precautions: Fall Precaution Comments: PEG, right weakness    Mobility  Bed Mobility   Bed Mobility: Supine to Sit     Supine to sit: Min guard;HOB elevated     General bed mobility comments: pt able to transition to EOB with increased time and rail  Transfers Overall transfer level: Needs assistance   Transfers: Sit to/from Stand Sit to Stand: Min guard         General transfer comment: Min Guard A for safety  Ambulation/Gait Ambulation/Gait assistance: Herbalist (Feet): 35 Feet Assistive device: Rolling walker (2 wheeled) Gait Pattern/deviations: Step-through pattern;Decreased stride  length;Trunk flexed   Gait velocity interpretation: 1.31 - 2.62 ft/sec, indicative of limited community ambulator General Gait Details: pt maintaining self in RW this session with improved posture with increased time for turns with cues for posture, safety and sequence. Pt declined further gait   Stairs             Wheelchair Mobility    Modified Rankin (Stroke Patients Only)       Balance Overall balance assessment: Needs assistance Sitting-balance support: Feet supported;No upper extremity supported Sitting balance-Leahy Scale: Good Sitting balance - Comments: static sitting without assist   Standing balance support: Bilateral upper extremity supported;During functional activity Standing balance-Leahy Scale: Poor Standing balance comment: requiring UE support                            Cognition Arousal/Alertness: Awake/alert Behavior During Therapy: Flat affect Overall Cognitive Status: Difficult to assess                     Current Attention Level: Sustained   Following Commands: Follows one step commands consistently     Problem Solving: Slow processing General Comments: pt non verbal throughout session and responding to commands for mobility and function and shaking head for not walking further      Exercises General Exercises - Lower Extremity Long Arc Quad: AROM;Both;Seated;20 reps Hip Flexion/Marching: AROM;Both;Seated;20 reps Toe Raises: AROM;Both;Seated;20 reps Heel Raises: AROM;Both;20 reps;Seated    General Comments        Pertinent Vitals/Pain Pain Assessment: No/denies pain    Home Living  Prior Function            PT Goals (current goals can now be found in the care plan section) Progress towards PT goals: Progressing toward goals    Frequency    Min 2X/week      PT Plan Current plan remains appropriate    Co-evaluation              AM-PAC PT "6 Clicks" Mobility    Outcome Measure  Help needed turning from your back to your side while in a flat bed without using bedrails?: A Little Help needed moving from lying on your back to sitting on the side of a flat bed without using bedrails?: A Little Help needed moving to and from a bed to a chair (including a wheelchair)?: A Little Help needed standing up from a chair using your arms (e.g., wheelchair or bedside chair)?: A Little Help needed to walk in hospital room?: A Little Help needed climbing 3-5 steps with a railing? : A Lot 6 Click Score: 17    End of Session Equipment Utilized During Treatment: Gait belt Activity Tolerance: Patient limited by fatigue Patient left: in chair;with call bell/phone within reach;with chair alarm set Nurse Communication: Mobility status PT Visit Diagnosis: Unsteadiness on feet (R26.81);Muscle weakness (generalized) (M62.81)     Time: 2575-0518 PT Time Calculation (min) (ACUTE ONLY): 21 min  Charges:  $Therapeutic Activity: 8-22 mins                     Tersea Aulds P, PT Acute Rehabilitation Services Pager: [phone] Office: [phone]    Sandy Salaam Mishti [place] 06/22/2020, 12:28 PM

## 2020-06-22 NOTE — Progress Notes (Signed)
PROGRESS NOTE    Evan Chavez  OEH:[id number] DOB: 06-Jun-1961 DOA: 05/10/2020 PCP: Rosita Fire, MD    Chief Complaint  Patient presents with  . Shortness of Breath    Brief Narrative:  59 year old gentleman with prior history of GERD, Barrett's esophagus, peptic ulcer disease with extensive gastric erosion, duodenal ulcer and esophageal stricture s/p balloon dilatation on 3/2 0 20, CVA, hyperlipidemia, BPH, remote cocaine abuse presents with weakness productive cough, fever and dyspnea was found to have community-acquired pneumonia.Hospital course complicated by right sided hydropneumothorax with possible lung collapse s/p thoracentesis on the right on 05/14/2020, esophageal perforation with right sided hydropneumothorax, complicated by aspiration pneumonia. Patient underwent right sided chest tube placement, transferred from [facility] to [facility] for VATS and decortication by CT surgery as well as esophageal stent placement and PEG placement on 05/16/20. Patient had episodes of persistent leakage from esophagus on esophagogram as well as underwent drainage of loculated pleural effusion on 05/25/20 by CT surgery. Repeat esophagogram on 05/28/2020 did not show any esophageal leak. CT chest with contrast on 9/7 showed Interval increase in a loculated right posteromedial pleural fluid collection with surrounding enhancing pleura, which is sterility indeterminate by imaging but possibly an empyema. CT surgery re consulted, suggested that residual entrapped lung and poor pulmonary toilet. No need for further surgery or drainage.  Overnight pt had fever of 101, tachypnea, tachycardic and vomited this afternoon.  Discharge discontinued. Notified the family and csw. Pt denies any sob or chest pain.   Assessment & Plan:   Active Problems:   Essential hypertension   Atrial tachycardia (HCC)   Dyslipidemia   CAP (community acquired pneumonia)   Transaminitis   Lobar pneumonia ([place])   Sepsis due to  undetermined organism ([place])   Fever   Gastroesophageal reflux disease with esophagitis   Shortness of breath   Thyroid nodule   BPH (benign prostatic hyperplasia)   Loculated pleural effusion   Ureteral stone with hydronephrosis   Pneumothorax on right   Acute respiratory failure with hypoxia (HCC)   Chronic bilateral pleural effusions   Parapneumonic effusion   Malnutrition of moderate degree   Esophageal perforation   Pressure injury of skin   Atrial fibrillation with RVR (HCC)   Esophageal anastomotic leak   Atelectasis   Right lower lobe pulmonary infiltrate   Sinus tachycardia   Dysphagia   Respiratory distress   Status post thoracentesis   Acute respiratory failure with hypoxia secondary to community-acquired pneumonia, Parapneumonic effusion. Patient underwent chest tube placement initially by cardiothoracic surgery.  Pleural fluid culture significant for Candida parapsilosis.  He completed the course of cefepime and fluconazole. He was weaned off oxygen.  Currently pt is on RA , with good sats.  Pt denies any new complaints. But he has significant productive cough.  mucinex and chest PT added.  Pulmonary hygiene    Hydropneumothorax s/p chest tube placement by CT surgery.  Resolved.    Sepsis present on admission:  In the setting of aspiration pneumonia, empyema and mediastinitis. Patient completed the course of cefepime. Patient continues to have some residual tachycardia.   Fever last night.  Will get CXR, CT abd and pelvis as he vomited tube feedings.  Blood cultures done.  Get repeat labs.  UA ordered.     Esophageal rupture s/p stent placement by CT surgery.   Oropharyngeal dysphagia s/p PEG placement on tube feeds.   SLP evaluation recommending clear liquid diet for now    History of paroxysmal atrial fibrillation  Sinus tachycardia improved with increase in metoprolol.  CT angiogram is negative for PE. Continue with metoprolol for rate  control and Eliquis for anticoagulation . Last echocardiogram showed Left ventricular ejection fraction, by estimation, is 65 to 70%. The  left ventricle has normal function. The left ventricle has no regional  wall motion abnormalities. Left ventricular diastolic parameters are consistent with Grade I diastolic  dysfunction (impaired relaxation).     Acute on chronic blood loss anemia in the setting of blood loss from esophageal rupture/postop anemia in addition to hematemesis which have all resolved.  Currently hemoglobin is stable around 9.   Chronic sinus tachycardia Heart rates between 90s to110/min.  Increased metoprolol to 100 mg BID.     History of stroke with residual right-sided weakness.   Patient was on Lipitor as an outpatient which was being held for elevated liver enzymes. Recheck liver enzymes in 2 weeks and restart lipitor   Left hydronephrosis with ureteral stone. -Outpatient follow-up with urology for shockwave lithotripsy.   Hyponatremia Improved   Depression Continue with Prozac daily.       DVT prophylaxis: Eliquis.  Code Status: (Full code) Family Communication: discussed with mother over the phone.  Disposition:   Status is: Inpatient  Remains inpatient appropriate because:Ongoing diagnostic testing needed not appropriate for outpatient work up, Unsafe d/c plan and Inpatient level of care appropriate due to severity of illness, fever overnight, and vomiting this afternoon.    Dispo:  Patient From: Home  Planned Disposition: [place]  Expected discharge date: 06/25/2020  Medically stable for discharge:   No        Consultants:   [place].    Procedures:   VATS/DECORTICATION/PEG PLACEMENT/ESOPHAGEAL STENT PLACEMENT (05/17/2020)  RIGHT THORACOSCOPY/SOPHAGOGASTROSCOPY/ESOPHAGEAL STENT PLACEMENT/ESOPHAGRAM   Antimicrobials:  Anti-infectives (From admission, onward)   Start     Dose/Rate Route  Frequency Ordered Stop   06/03/20 0200  vancomycin (VANCOCIN) IVPB 1000 mg/200 mL premix  Status:  Discontinued        1,000 mg 200 mL/hr over 60 Minutes Intravenous Every 12 hours 06/02/20 1248 06/05/20 0915   06/02/20 2200  ceFEPIme (MAXIPIME) 2 g in sodium chloride 0.9 % 100 mL IVPB        2 g 200 mL/hr over 30 Minutes Intravenous Every 8 hours 06/02/20 1251 06/08/20 2359   06/02/20 2000  piperacillin-tazobactam (ZOSYN) IVPB 3.375 g  Status:  Discontinued        3.375 g 12.5 mL/hr over 240 Minutes Intravenous Every 8 hours 06/02/20 1248 06/02/20 1249   06/02/20 1330  vancomycin (VANCOREADY) IVPB 1250 mg/250 mL        1,250 mg 166.7 mL/hr over 90 Minutes Intravenous  Once 06/02/20 1248 06/02/20 1646   06/02/20 1330  piperacillin-tazobactam (ZOSYN) IVPB 3.375 g  Status:  Discontinued        3.375 g 100 mL/hr over 30 Minutes Intravenous  Once 06/02/20 1248 06/02/20 1249   06/02/20 1215  vancomycin (VANCOCIN) IVPB 1000 mg/200 mL premix  Status:  Discontinued        1,000 mg 200 mL/hr over 60 Minutes Intravenous  Once 06/02/20 1202 06/02/20 1251   06/02/20 1215  ceFEPIme (MAXIPIME) 2 g in sodium chloride 0.9 % 100 mL IVPB        2 g 200 mL/hr over 30 Minutes Intravenous  Once 06/02/20 1202 06/02/20 1506   05/29/20 1800  vancomycin (VANCOCIN) IVPB 1000 mg/200 mL premix  Status:  Discontinued  1,000 mg 200 mL/hr over 60 Minutes Intravenous Every 12 hours 05/29/20 1151 05/30/20 0912   05/24/20 2100  vancomycin (VANCOREADY) IVPB 750 mg/150 mL  Status:  Discontinued        750 mg 150 mL/hr over 60 Minutes Intravenous Every 12 hours 05/24/20 0813 05/29/20 1151   05/24/20 0900  vancomycin (VANCOREADY) IVPB 1500 mg/300 mL        1,500 mg 150 mL/hr over 120 Minutes Intravenous  Once 05/24/20 0813 05/24/20 1221   05/17/20 0900  fluconazole (DIFLUCAN) IVPB 400 mg  Status:  Discontinued        400 mg 100 mL/hr over 120 Minutes Intravenous Every 24 hours 05/17/20 0749 06/05/20 0915    05/17/20 0600  vancomycin (VANCOREADY) IVPB 750 mg/150 mL  Status:  Discontinued        750 mg 150 mL/hr over 60 Minutes Intravenous Every 8 hours 05/16/20 2154 05/17/20 0903   05/17/20 0400  vancomycin (VANCOREADY) IVPB 750 mg/150 mL  Status:  Discontinued        750 mg 150 mL/hr over 60 Minutes Intravenous Every 8 hours 05/16/20 1945 05/16/20 2154   05/16/20 2200  vancomycin (VANCOREADY) IVPB 1250 mg/250 mL        1,250 mg 166.7 mL/hr over 90 Minutes Intravenous STAT 05/16/20 2153 05/16/20 2348   05/16/20 2000  vancomycin (VANCOREADY) IVPB 1250 mg/250 mL  Status:  Discontinued        1,250 mg 166.7 mL/hr over 90 Minutes Intravenous  Once 05/16/20 1945 05/16/20 2153   05/15/20 1600  piperacillin-tazobactam (ZOSYN) IVPB 3.375 g  Status:  Discontinued        3.375 g 12.5 mL/hr over 240 Minutes Intravenous Every 8 hours 05/15/20 0945 05/30/20 0912   05/15/20 0930  piperacillin-tazobactam (ZOSYN) IVPB 3.375 g        3.375 g 100 mL/hr over 30 Minutes Intravenous  Once 05/15/20 0921 05/15/20 1009   05/13/20 2000  azithromycin (ZITHROMAX) tablet 500 mg        500 mg Oral Daily 05/13/20 1155 05/14/20 2127   05/10/20 2200  cefTRIAXone (ROCEPHIN) 2 g in sodium chloride 0.9 % 100 mL IVPB  Status:  Discontinued        2 g 200 mL/hr over 30 Minutes Intravenous Every 24 hours 05/10/20 2148 05/15/20 0911   05/10/20 2200  azithromycin (ZITHROMAX) 500 mg in sodium chloride 0.9 % 250 mL IVPB  Status:  Discontinued        500 mg 250 mL/hr over 60 Minutes Intravenous Every 24 hours 05/10/20 2148 05/13/20 1155       Subjective: Pt denies any new complaints, but continues to have a weak productive cough   Objective: Vitals:   06/22/20 0300 06/22/20 0700 06/22/20 0722 06/22/20 1114  BP: 110/68  125/87 130/88  Pulse: (!) 102  (!) 116   Resp: (!) 25  (!) 30 (!) 35  Temp: 98.1 F (36.7 C)  98.5 F (36.9 C) 98.9 F (37.2 C)  TempSrc: Oral  Oral Oral  SpO2: 100%  94%   Weight:  59.3 kg    Height:         Intake/Output Summary (Last 24 hours) at 06/22/2020 1429 Last data filed at 06/22/2020 0337 Gross per 24 hour  Intake 1207 ml  Output 750 ml  Net 457 ml   Filed Weights   06/20/20 0333 06/21/20 0633 06/22/20 0700  Weight: 59.2 kg 59.2 kg 59.3 kg    Examination:  General exam: alert and  comfortable, not in distress.  Respiratory system: diminished air entry at bases, more on the right compared to left, tachypnea present. No wheezing heard.  Cardiovascular system: S1S2 heard, tachycardia, no JVD, no pedal edema.  Gastrointestinal system: Abdomen is soft, nontender, nondistended, bowel sounds normal, PEG in place Central nervous system: Alert and answering questions, following commands Extremities: No cyanosis or clubbing Skin: No rashes seen Psychiatry: Mood is appropriate    Data Reviewed: I have personally reviewed following labs and imaging studies  CBC: Recent Labs  Lab 06/17/20 0951 06/18/20 1209 06/20/20 0801  WBC 12.3* 11.0* 10.4  HGB 8.9* 9.4* 9.1*  HCT 30.3* 31.7* 30.8*  MCV 81.9 82.1 81.1  PLT 411* 405* 434*    Basic Metabolic Panel: Recent Labs  Lab 06/17/20 0951 06/19/20 0146 06/20/20 0801  NA 130* 133* 133*  K 4.3 4.7 4.1  CL 95* 97* 97*  CO2 25 29 26   GLUCOSE 145* 132* 157*  BUN 12 12 11   CREATININE 0.69 0.68 0.65  CALCIUM 8.5* 8.9 8.8*    GFR: Estimated Creatinine Clearance: 82.4 mL/min (by C-G formula based on SCr of 0.65 mg/dL).  Liver Function Tests: Recent Labs  Lab 06/17/20 0951 06/20/20 0801  AST 55* 61*  ALT 154* 198*  ALKPHOS 142* 145*  BILITOT 0.5 0.4  PROT 7.5 7.4  ALBUMIN 2.1* 2.2*    CBG: Recent Labs  Lab 06/21/20 1117 06/21/20 1613 06/21/20 2116 06/22/20 0617 06/22/20 1115  GLUCAP 149* 129* 126* 152* 130*     Recent Results (from the past 240 hour(s))  Culture, blood (routine x 2)     Status: None (Preliminary result)   Collection Time: 06/19/20  2:30 PM   Specimen: BLOOD LEFT ARM  Result Value  Ref Range Status   Specimen Description BLOOD LEFT ARM  Final   Special Requests   Final    BOTTLES DRAWN AEROBIC AND ANAEROBIC Blood Culture adequate volume   Culture   Final    NO GROWTH 3 DAYS Performed at [facility], [place] [address], [place], [place] [address]    Report Status PENDING  Incomplete  Culture, blood (routine x 2)     Status: None (Preliminary result)   Collection Time: 06/19/20  2:35 PM   Specimen: BLOOD RIGHT HAND  Result Value Ref Range Status   Specimen Description BLOOD RIGHT HAND  Final   Special Requests   Final    BOTTLES DRAWN AEROBIC AND ANAEROBIC Blood Culture adequate volume   Culture   Final    NO GROWTH 3 DAYS Performed at [facility], [place] [address] [place], [place] [address]    Report Status PENDING  Incomplete  SARS Coronavirus 2 by RT PCR (hospital order, performed in [facility] hospital lab) Nasopharyngeal Nasopharyngeal Swab     Status: None   Collection Time: 06/21/20  4:20 PM   Specimen: Nasopharyngeal Swab  Result Value Ref Range Status   SARS Coronavirus 2 NEGATIVE NEGATIVE Final    Comment: (NOTE) SARS-CoV-2 target nucleic acids are NOT DETECTED.  The SARS-CoV-2 RNA is generally detectable in upper and lower respiratory specimens during the acute phase of infection. The lowest concentration of SARS-CoV-2 viral copies this assay can detect is 250 copies / mL. A negative result does not preclude SARS-CoV-2 infection and should not be used as the sole basis for treatment or other patient management decisions.  A negative result may occur with improper specimen collection / handling, submission of specimen other than  nasopharyngeal swab, presence of viral mutation(s) within the areas targeted by this assay, and inadequate number of viral copies (<250 copies / mL). A negative result must be combined with clinical observations, patient history, and epidemiological information.  Fact Sheet for Patients:     StrictlyIdeas.no  Fact Sheet for Healthcare Providers: BankingDealers.co.za  This test is not yet approved or  cleared by the [place] FDA and has been authorized for detection and/or diagnosis of SARS-CoV-2 by FDA under an Emergency Use Authorization (EUA).  This EUA will remain in effect (meaning this test can be used) for the duration of the COVID-19 declaration under Section 564(b)(1) of the Act, 21 U.S.C. section 360bbb-3(b)(1), unless the authorization is terminated or revoked sooner.  Performed at [facility], [place] [address], [place], [place] [address]          Radiology Studies: DG Chest 2 View  Result Date: 06/22/2020 CLINICAL DATA:  Fevers, history of esophageal stent placement EXAM: CHEST - 2 VIEW COMPARISON:  06/18/2020, CT from 06/19/2020 FINDINGS: Cardiac shadow is mildly prominent but stable. Mid to distal esophageal stent is again seen and stable. The left lung remains clear. Right lung demonstrates small pleural effusion as well as right lower lobe consolidation. The overall appearance is similar to that seen on the prior exam. A portion of this is shown to represent loculated fluid on recent CT. No bony abnormality is noted. Gastrostomy catheter is noted in the upper abdomen. IMPRESSION: Persistent right-sided effusion and opacity a portion of which is related to loculated fluid. The overall appearance is similar to that seen on prior exam. Electronically Signed   By: Inez Catalina M.D.   On: 06/22/2020 09:35        Scheduled Meds: . apixaban  5 mg Oral BID  . feeding supplement (PROSource TF)  45 mL Per Tube BID  . FLUoxetine  20 mg Per Tube Daily  . insulin aspart  0-24 Units Subcutaneous TID AC & HS  . mouth rinse  15 mL Mouth Rinse q12n4p  . metoprolol tartrate  100 mg Per Tube BID  . pantoprazole sodium  40 mg Per Tube BID  . sennosides  5 mL Per Tube QHS  . traZODone  50 mg Per Tube QHS    Continuous Infusions: . sodium chloride 1,000 mL (06/06/20 0028)  . sodium chloride    . feeding supplement (OSMOLITE 1.5 CAL) Stopped (06/22/20 1347)     LOS: 42 days        Hosie Poisson, MD [facility]   To contact the attending provider between 7A-7P or the covering provider during after hours 7P-7A, please log into the web site [email or web address] and access using universal [place] password for that web site. If you do not have the password, please call the hospital operator.  06/22/2020, 2:29 PM

## 2020-06-23 LAB — COMPREHENSIVE METABOLIC PANEL
ALT: 158 U/L — ABNORMAL HIGH (ref 0–44)
AST: 45 U/L — ABNORMAL HIGH (ref 15–41)
Albumin: 2 g/dL — ABNORMAL LOW (ref 3.5–5.0)
Alkaline Phosphatase: 144 U/L — ABNORMAL HIGH (ref 38–126)
Anion gap: 10 (ref 5–15)
BUN: 11 mg/dL (ref 6–20)
CO2: 25 mmol/L (ref 22–32)
Calcium: 8.4 mg/dL — ABNORMAL LOW (ref 8.9–10.3)
Chloride: 96 mmol/L — ABNORMAL LOW (ref 98–111)
Creatinine, Ser: 0.84 mg/dL (ref 0.61–1.24)
GFR calc Af Amer: >60 mL/min (ref >60)
GFR calc non Af Amer: >60 mL/min (ref >60)
Glucose, Bld: 143 mg/dL — ABNORMAL HIGH (ref 70–99)
Potassium: 4.3 mmol/L (ref 3.5–5.1)
Sodium: 131 mmol/L — ABNORMAL LOW (ref 135–145)
Total Bilirubin: 0.4 mg/dL (ref 0.3–1.2)
Total Protein: 7.3 g/dL (ref 6.5–8.1)

## 2020-06-23 LAB — CBC
HCT: 28.7 % — ABNORMAL LOW (ref 39.0–52.0)
Hemoglobin: 8.4 g/dL — ABNORMAL LOW (ref 13.0–17.0)
MCH: 23.4 pg — ABNORMAL LOW (ref 26.0–34.0)
MCHC: 29.3 g/dL — ABNORMAL LOW (ref 30.0–36.0)
MCV: 79.9 fL — ABNORMAL LOW (ref 80.0–100.0)
Platelets: 421 10*3/uL — ABNORMAL HIGH (ref 150–400)
RBC: 3.59 MIL/uL — ABNORMAL LOW (ref 4.22–5.81)
RDW: 15.9 % — ABNORMAL HIGH (ref 11.5–15.5)
WBC: 13.4 10*3/uL — ABNORMAL HIGH (ref 4.0–10.5)
nRBC: 0 % (ref 0.0–0.2)

## 2020-06-23 LAB — URINALYSIS, ROUTINE W REFLEX MICROSCOPIC
Bilirubin Urine: NEGATIVE
Glucose, UA: NEGATIVE mg/dL
Hgb urine dipstick: NEGATIVE
Ketones, ur: NEGATIVE mg/dL
Leukocytes,Ua: NEGATIVE
Nitrite: NEGATIVE
Protein, ur: NEGATIVE mg/dL
Specific Gravity, Urine: 1.019 (ref 1.005–1.030)
pH: 6 (ref 5.0–8.0)

## 2020-06-23 LAB — GLUCOSE, CAPILLARY
Glucose-Capillary: 151 mg/dL — ABNORMAL HIGH (ref 70–99)
Glucose-Capillary: 194 mg/dL — ABNORMAL HIGH (ref 70–99)
Glucose-Capillary: 142 mg/dL — ABNORMAL HIGH (ref 70–99)
Glucose-Capillary: 140 mg/dL — ABNORMAL HIGH (ref 70–99)

## 2020-06-23 LAB — PROCALCITONIN: Procalcitonin: 0.15 ng/mL

## 2020-06-23 LAB — MRSA PCR SCREENING: MRSA by PCR: POSITIVE — AB

## 2020-06-23 MED ORDER — CHLORHEXIDINE GLUCONATE CLOTH 2 % EX PADS
6.0000 | MEDICATED_PAD | Freq: Every day | CUTANEOUS | Status: AC
  Administered 2020-06-24 – 2020-06-28 (×5): 6 via TOPICAL

## 2020-06-23 MED ORDER — MUPIROCIN 2 % EX OINT
1.0000 "application " | TOPICAL_OINTMENT | Freq: Two times a day (BID) | CUTANEOUS | Status: AC
  Administered 2020-06-23 – 2020-06-28 (×10): 1 via NASAL
  Filled 2020-06-23 (×4): qty 22

## 2020-06-23 NOTE — Progress Notes (Signed)
     [address]       [place] [address]             [phone]      Low grade fevers Mild leukocytosis Clinically stable  CT guided chest tube ordered  Keelan Tripodi O [place]

## 2020-06-23 NOTE — Plan of Care (Signed)

## 2020-06-23 NOTE — Progress Notes (Signed)
PROGRESS NOTE    Gotham Raden Sees  WUX:[id number] DOB: Oct 02, 1961 DOA: 05/10/2020 PCP: Rosita Fire, MD    Chief Complaint  Patient presents with  . Shortness of Breath    Brief Narrative:  59 year old gentleman with prior history of GERD, Barrett's esophagus, peptic ulcer disease with extensive gastric erosion, duodenal ulcer and esophageal stricture s/p balloon dilatation on 3/2 0 20, CVA, hyperlipidemia, BPH, remote cocaine abuse presents with weakness productive cough, fever and dyspnea was found to have community-acquired pneumonia.Hospital course complicated by right sided hydropneumothorax with possible lung collapse s/p thoracentesis on the right on 05/14/2020, esophageal perforation with right sided hydropneumothorax, complicated by aspiration pneumonia. Patient underwent right sided chest tube placement, transferred from [facility] to [facility] for VATS and decortication by CT surgery as well as esophageal stent placement and PEG placement on 05/16/20. Patient had episodes of persistent leakage from esophagus on esophagogram as well as underwent drainage of loculated pleural effusion on 05/25/20 by CT surgery. Repeat esophagogram on 05/28/2020 did not show any esophageal leak. CT chest with contrast on 9/7 showed Interval increase in a loculated right posteromedial pleural fluid collection with surrounding enhancing pleura, which is sterility indeterminate by imaging but possibly an empyema. CT surgery re consulted, suggested that residual entrapped lung and poor pulmonary toilet. Over the last 48 hours pt had low grade temp with elevated wbc count, and CT abd and pelvis shows loculated complex pleural effusion , possibly an abscess.  CT surgery recommends  CT guided chest tube.     Assessment & Plan:   Active Problems:   Essential hypertension   Atrial tachycardia (HCC)   Dyslipidemia   CAP (community acquired pneumonia)   Transaminitis   Lobar pneumonia ([place])   Sepsis due to undetermined  organism ([place])   Fever   Gastroesophageal reflux disease with esophagitis   Shortness of breath   Thyroid nodule   BPH (benign prostatic hyperplasia)   Loculated pleural effusion   Ureteral stone with hydronephrosis   Pneumothorax on right   Acute respiratory failure with hypoxia (HCC)   Chronic bilateral pleural effusions   Parapneumonic effusion   Malnutrition of moderate degree   Esophageal perforation   Pressure injury of skin   Atrial fibrillation with RVR (HCC)   Esophageal anastomotic leak   Atelectasis   Right lower lobe pulmonary infiltrate   Sinus tachycardia   Dysphagia   Respiratory distress   Status post thoracentesis   Acute respiratory failure with hypoxia secondary to community-acquired pneumonia, Parapneumonic effusion. Patient underwent chest tube placement initially by cardiothoracic surgery.  Pleural fluid culture significant for Candida parapsilosis.  He completed the course of cefepime and fluconazole. He was weaned off oxygen.  Currently pt is on RA , with good sats.  Over the last 48 hours, pt had fevers, wbc count, tachypnea, he was started on IV cefepime for possible lung abscess. Dr Sunday Corn foot with TCTS recommended CT guided chest tube    Hydropneumothorax s/p chest tube placement by CT surgery.  Resolved.    Sepsis present on admission:  In the setting of aspiration pneumonia, empyema and mediastinitis. Patient continues to have some residual tachycardia.   Lung abscess; Fevers , leukocytosis, started him IV cefepime. CT guided chest tube placement.    Esophageal rupture s/p stent placement by CT surgery.   Oropharyngeal dysphagia s/p PEG placement on tube feeds.   SLP evaluation recommending clear liquid diet for now    History of paroxysmal atrial fibrillation Sinus tachycardia improved with increase  in metoprolol.  CT angiogram is negative for PE. Continue with metoprolol for rate control and Eliquis for anticoagulation . Last  echocardiogram showed Left ventricular ejection fraction, by estimation, is 65 to 70%. The  left ventricle has normal function. The left ventricle has no regional  wall motion abnormalities. Left ventricular diastolic parameters are consistent with Grade I diastolic  dysfunction (impaired relaxation).     Acute on chronic blood loss anemia in the setting of blood loss from esophageal rupture/postop anemia in addition to hematemesis which have all resolved.  Currently hemoglobin is stable around 9.   Chronic sinus tachycardia Heart rates between 90s to110/min.  Increased metoprolol to 100 mg BID.     History of stroke with residual right-sided weakness.   Patient was on Lipitor as an outpatient which was being held for elevated liver enzymes. Recheck liver enzymes in 2 weeks and restart lipitor if liver enzymes wnl.    Left hydronephrosis with ureteral stone. CT abg shows persistent left hydronephrosis.  -Outpatient follow-up with urology for shockwave lithotripsy.   Hyponatremia Improved   Depression Continue with Prozac daily.       DVT prophylaxis: Eliquis.  Code Status: (Full code) Family Communication: discussed with mother over the phone.  Disposition:   Status is: Inpatient  Remains inpatient appropriate because:Ongoing diagnostic testing needed not appropriate for outpatient work up, Unsafe d/c plan and Inpatient level of care appropriate due to severity of illness, CT guided chest tube.    Dispo:  Patient From: Home  Planned Disposition: [place]  Expected discharge date: 06/25/2020  Medically stable for discharge:   No        Consultants:   [place].    Procedures:   VATS/DECORTICATION/PEG PLACEMENT/ESOPHAGEAL STENT PLACEMENT (05/17/2020)  RIGHT THORACOSCOPY/SOPHAGOGASTROSCOPY/ESOPHAGEAL STENT PLACEMENT/ESOPHAGRAM   Antimicrobials:  Anti-infectives (From admission, onward)   Start     Dose/Rate Route Frequency  Ordered Stop   06/22/20 2030  ceFEPIme (MAXIPIME) 2 g in sodium chloride 0.9 % 100 mL IVPB        2 g 200 mL/hr over 30 Minutes Intravenous Every 8 hours 06/22/20 2020     06/03/20 0200  vancomycin (VANCOCIN) IVPB 1000 mg/200 mL premix  Status:  Discontinued        1,000 mg 200 mL/hr over 60 Minutes Intravenous Every 12 hours 06/02/20 1248 06/05/20 0915   06/02/20 2200  ceFEPIme (MAXIPIME) 2 g in sodium chloride 0.9 % 100 mL IVPB        2 g 200 mL/hr over 30 Minutes Intravenous Every 8 hours 06/02/20 1251 06/08/20 2359   06/02/20 2000  piperacillin-tazobactam (ZOSYN) IVPB 3.375 g  Status:  Discontinued        3.375 g 12.5 mL/hr over 240 Minutes Intravenous Every 8 hours 06/02/20 1248 06/02/20 1249   06/02/20 1330  vancomycin (VANCOREADY) IVPB 1250 mg/250 mL        1,250 mg 166.7 mL/hr over 90 Minutes Intravenous  Once 06/02/20 1248 06/02/20 1646   06/02/20 1330  piperacillin-tazobactam (ZOSYN) IVPB 3.375 g  Status:  Discontinued        3.375 g 100 mL/hr over 30 Minutes Intravenous  Once 06/02/20 1248 06/02/20 1249   06/02/20 1215  vancomycin (VANCOCIN) IVPB 1000 mg/200 mL premix  Status:  Discontinued        1,000 mg 200 mL/hr over 60 Minutes Intravenous  Once 06/02/20 1202 06/02/20 1251   06/02/20 1215  ceFEPIme (MAXIPIME) 2 g in sodium chloride 0.9 % 100 mL  IVPB        2 g 200 mL/hr over 30 Minutes Intravenous  Once 06/02/20 1202 06/02/20 1506   05/29/20 1800  vancomycin (VANCOCIN) IVPB 1000 mg/200 mL premix  Status:  Discontinued        1,000 mg 200 mL/hr over 60 Minutes Intravenous Every 12 hours 05/29/20 1151 05/30/20 0912   05/24/20 2100  vancomycin (VANCOREADY) IVPB 750 mg/150 mL  Status:  Discontinued        750 mg 150 mL/hr over 60 Minutes Intravenous Every 12 hours 05/24/20 0813 05/29/20 1151   05/24/20 0900  vancomycin (VANCOREADY) IVPB 1500 mg/300 mL        1,500 mg 150 mL/hr over 120 Minutes Intravenous  Once 05/24/20 0813 05/24/20 1221   05/17/20 0900  fluconazole  (DIFLUCAN) IVPB 400 mg  Status:  Discontinued        400 mg 100 mL/hr over 120 Minutes Intravenous Every 24 hours 05/17/20 0749 06/05/20 0915   05/17/20 0600  vancomycin (VANCOREADY) IVPB 750 mg/150 mL  Status:  Discontinued        750 mg 150 mL/hr over 60 Minutes Intravenous Every 8 hours 05/16/20 2154 05/17/20 0903   05/17/20 0400  vancomycin (VANCOREADY) IVPB 750 mg/150 mL  Status:  Discontinued        750 mg 150 mL/hr over 60 Minutes Intravenous Every 8 hours 05/16/20 1945 05/16/20 2154   05/16/20 2200  vancomycin (VANCOREADY) IVPB 1250 mg/250 mL        1,250 mg 166.7 mL/hr over 90 Minutes Intravenous STAT 05/16/20 2153 05/16/20 2348   05/16/20 2000  vancomycin (VANCOREADY) IVPB 1250 mg/250 mL  Status:  Discontinued        1,250 mg 166.7 mL/hr over 90 Minutes Intravenous  Once 05/16/20 1945 05/16/20 2153   05/15/20 1600  piperacillin-tazobactam (ZOSYN) IVPB 3.375 g  Status:  Discontinued        3.375 g 12.5 mL/hr over 240 Minutes Intravenous Every 8 hours 05/15/20 0945 05/30/20 0912   05/15/20 0930  piperacillin-tazobactam (ZOSYN) IVPB 3.375 g        3.375 g 100 mL/hr over 30 Minutes Intravenous  Once 05/15/20 0921 05/15/20 1009   05/13/20 2000  azithromycin (ZITHROMAX) tablet 500 mg        500 mg Oral Daily 05/13/20 1155 05/14/20 2127   05/10/20 2200  cefTRIAXone (ROCEPHIN) 2 g in sodium chloride 0.9 % 100 mL IVPB  Status:  Discontinued        2 g 200 mL/hr over 30 Minutes Intravenous Every 24 hours 05/10/20 2148 05/15/20 0911   05/10/20 2200  azithromycin (ZITHROMAX) 500 mg in sodium chloride 0.9 % 250 mL IVPB  Status:  Discontinued        500 mg 250 mL/hr over 60 Minutes Intravenous Every 24 hours 05/10/20 2148 05/13/20 1155       Subjective: persistent cough and tachypnea,   Objective: Vitals:   06/23/20 0338 06/23/20 0359 06/23/20 0700 06/23/20 1207  BP: 128/82  123/83 128/77  Pulse: (!) 116     Resp: (!) 25  (!) 33 (!) 25  Temp: 99.8 F (37.7 C)  99.9 F (37.7 C)  99 F (37.2 C)  TempSrc: Oral  Axillary Oral  SpO2: 94%  95% 95%  Weight:  59.4 kg    Height:        Intake/Output Summary (Last 24 hours) at 06/23/2020 1442 Last data filed at 06/23/2020 0338 Gross per 24 hour  Intake 536 ml  Output  425 ml  Net 111 ml   Filed Weights   06/21/20 0633 06/22/20 0700 06/23/20 0359  Weight: 59.2 kg 59.3 kg 59.4 kg    Examination:  General exam: Alert and comfortable.  Respiratory system: diminished at bases, tachypnea present. NO wheezing.  Cardiovascular system: S1S2 heard, tachycardia, no JVD, no pedal edema.  Gastrointestinal system: abd is soft, non tender non distended, bowel sounds wnl.  Central nervous system: alert , no new focal deficits. Extremities: No pedal edema. Skin: No rashes seen.  Psychiatry: Mood is appropriate    Data Reviewed: I have personally reviewed following labs and imaging studies  CBC: Recent Labs  Lab 06/17/20 0951 06/18/20 1209 06/20/20 0801 06/22/20 1444 06/23/20 0700  WBC 12.3* 11.0* 10.4 14.5* 13.4*  NEUTROABS  --   --   --  11.9*  --   HGB 8.9* 9.4* 9.1* 9.5* 8.4*  HCT 30.3* 31.7* 30.8* 32.2* 28.7*  MCV 81.9 82.1 81.1 82.1 79.9*  PLT 411* 405* 434* 466* 421*    Basic Metabolic Panel: Recent Labs  Lab 06/17/20 0951 06/19/20 0146 06/20/20 0801 06/22/20 1444 06/23/20 0700  NA 130* 133* 133* 133* 131*  K 4.3 4.7 4.1 4.6 4.3  CL 95* 97* 97* 94* 96*  CO2 25 29 26 30 25   GLUCOSE 145* 132* 157* 141* 143*  BUN 12 12 11 12 11   CREATININE 0.69 0.68 0.65 0.85 0.84  CALCIUM 8.5* 8.9 8.8* 8.9 8.4*    GFR: Estimated Creatinine Clearance: 78.6 mL/min (by C-G formula based on SCr of 0.84 mg/dL).  Liver Function Tests: Recent Labs  Lab 06/17/20 0951 06/20/20 0801 06/23/20 0700  AST 55* 61* 45*  ALT 154* 198* 158*  ALKPHOS 142* 145* 144*  BILITOT 0.5 0.4 0.4  PROT 7.5 7.4 7.3  ALBUMIN 2.1* 2.2* 2.0*    CBG: Recent Labs  Lab 06/22/20 1612 06/22/20 1640 06/22/20 2105 06/23/20 0543  06/23/20 1110  GLUCAP 114* 115* 109* 151* 194*     Recent Results (from the past 240 hour(s))  Culture, blood (routine x 2)     Status: None (Preliminary result)   Collection Time: 06/19/20  2:30 PM   Specimen: BLOOD LEFT ARM  Result Value Ref Range Status   Specimen Description BLOOD LEFT ARM  Final   Special Requests   Final    BOTTLES DRAWN AEROBIC AND ANAEROBIC Blood Culture adequate volume   Culture   Final    NO GROWTH 4 DAYS Performed at [facility], [place] [address] [place], [place] [address]    Report Status PENDING  Incomplete  Culture, blood (routine x 2)     Status: None (Preliminary result)   Collection Time: 06/19/20  2:35 PM   Specimen: BLOOD RIGHT HAND  Result Value Ref Range Status   Specimen Description BLOOD RIGHT HAND  Final   Special Requests   Final    BOTTLES DRAWN AEROBIC AND ANAEROBIC Blood Culture adequate volume   Culture   Final    NO GROWTH 4 DAYS Performed at [facility], [place] [address], [place], [place] [address]    Report Status PENDING  Incomplete  SARS Coronavirus 2 by RT PCR (hospital order, performed in [facility] hospital lab) Nasopharyngeal Nasopharyngeal Swab     Status: None   Collection Time: 06/21/20  4:20 PM   Specimen: Nasopharyngeal Swab  Result Value Ref Range Status   SARS Coronavirus 2 NEGATIVE NEGATIVE Final    Comment: (NOTE) SARS-CoV-2 target nucleic acids  are NOT DETECTED.  The SARS-CoV-2 RNA is generally detectable in upper and lower respiratory specimens during the acute phase of infection. The lowest concentration of SARS-CoV-2 viral copies this assay can detect is 250 copies / mL. A negative result does not preclude SARS-CoV-2 infection and should not be used as the sole basis for treatment or other patient management decisions.  A negative result may occur with improper specimen collection / handling, submission of specimen other than nasopharyngeal swab, presence of viral mutation(s) within  the areas targeted by this assay, and inadequate number of viral copies (<250 copies / mL). A negative result must be combined with clinical observations, patient history, and epidemiological information.  Fact Sheet for Patients:   StrictlyIdeas.no  Fact Sheet for Healthcare Providers: BankingDealers.co.za  This test is not yet approved or  cleared by the [place] FDA and has been authorized for detection and/or diagnosis of SARS-CoV-2 by FDA under an Emergency Use Authorization (EUA).  This EUA will remain in effect (meaning this test can be used) for the duration of the COVID-19 declaration under Section 564(b)(1) of the Act, 21 U.S.C. section 360bbb-3(b)(1), unless the authorization is terminated or revoked sooner.  Performed at [facility], [place] [address] [place], [place] [address]   Culture, blood (Routine X 2) w Reflex to ID Panel     Status: None (Preliminary result)   Collection Time: 06/22/20  9:54 AM   Specimen: BLOOD RIGHT HAND  Result Value Ref Range Status   Specimen Description BLOOD RIGHT HAND  Final   Special Requests   Final    BOTTLES DRAWN AEROBIC ONLY Blood Culture adequate volume   Culture   Final    NO GROWTH 1 DAY Performed at [facility], [place] [address] [place], [place] [address]    Report Status PENDING  Incomplete  Culture, blood (Routine X 2) w Reflex to ID Panel     Status: None (Preliminary result)   Collection Time: 06/22/20 10:01 AM   Specimen: BLOOD LEFT ARM  Result Value Ref Range Status   Specimen Description BLOOD LEFT ARM  Final   Special Requests   Final    BOTTLES DRAWN AEROBIC AND ANAEROBIC Blood Culture adequate volume   Culture   Final    NO GROWTH 1 DAY Performed at [facility], [place] [address] [place], [place] [address]    Report Status PENDING  Incomplete         Radiology Studies: DG Chest 2 View  Result Date: 06/22/2020 CLINICAL DATA:  Fevers,  history of esophageal stent placement EXAM: CHEST - 2 VIEW COMPARISON:  06/18/2020, CT from 06/19/2020 FINDINGS: Cardiac shadow is mildly prominent but stable. Mid to distal esophageal stent is again seen and stable. The left lung remains clear. Right lung demonstrates small pleural effusion as well as right lower lobe consolidation. The overall appearance is similar to that seen on the prior exam. A portion of this is shown to represent loculated fluid on recent CT. No bony abnormality is noted. Gastrostomy catheter is noted in the upper abdomen. IMPRESSION: Persistent right-sided effusion and opacity a portion of which is related to loculated fluid. The overall appearance is similar to that seen on prior exam. Electronically Signed   By: Inez Catalina M.D.   On: 06/22/2020 09:35   CT ABDOMEN PELVIS W CONTRAST  Result Date: 06/22/2020 CLINICAL DATA:  Nausea and vomiting, vomited tube feedings EXAM: CT ABDOMEN AND PELVIS WITH CONTRAST TECHNIQUE: Multidetector  CT imaging of the abdomen and pelvis was performed using the standard protocol following bolus administration of intravenous contrast. CONTRAST:  180mL OMNIPAQUE IOHEXOL 300 MG/ML  SOLN COMPARISON:  06/19/2020, 05/13/2020 FINDINGS: Lower chest: Persistent complex loculated right pleural fluid collection, suspicious for empyema/abscess given history of esophageal perforation. Trace free-flowing right pleural fluid. Significant consolidation of the right lower lobe consistent with atelectasis. Esophageal stent identified, with fluid and debris within the esophageal lumen. Hepatobiliary: No focal liver abnormality is seen. No gallstones, gallbladder wall thickening, or biliary dilatation. Pancreas: Unremarkable. No pancreatic ductal dilatation or surrounding inflammatory changes. Spleen: Normal in size without focal abnormality. Adrenals/Urinary Tract: Persistent left-sided hydronephrosis related to an obstructing 11 mm proximal left ureteral calculus. Multiple  punctate nonobstructing left renal calculi again identified unchanged. Punctate nonobstructing right renal calculi are identified. Right kidney enhances normally. No right-sided obstruction. Bladder is unremarkable.  The adrenals are normal. Stomach/Bowel: Percutaneous gastrostomy tube is identified. No bowel obstruction or ileus. No bowel wall thickening or inflammatory change. Vascular/Lymphatic: Aortic atherosclerosis. No enlarged abdominal or pelvic lymph nodes. Reproductive: Prostate is unremarkable. Other: No free fluid or free intraperitoneal gas. No abdominal wall hernia. Musculoskeletal: No acute or destructive bony lesions. Reconstructed images demonstrate no additional findings. IMPRESSION: 1. Persistent complex loculated right pleural fluid collection, concerning for abscess. Persistent esophageal perforation cannot be excluded in light of previous findings. 2. Stable esophageal stent, with debris and fluid in the esophageal lumen as before. 3. Persistent left-sided hydronephrosis related to an 11 mm obstructing proximal left ureteral calculus. 4. Other bilateral nonobstructing renal calculi 5.  Aortic Atherosclerosis (ICD10-I70.0). Electronically Signed   By: Randa Ngo M.D.   On: 06/22/2020 19:38        Scheduled Meds: . apixaban  5 mg Oral BID  . feeding supplement (PROSource TF)  45 mL Per Tube BID  . FLUoxetine  20 mg Per Tube Daily  . insulin aspart  0-24 Units Subcutaneous TID AC & HS  . mouth rinse  15 mL Mouth Rinse q12n4p  . metoprolol tartrate  100 mg Per Tube BID  . pantoprazole sodium  40 mg Per Tube BID  . sennosides  5 mL Per Tube QHS  . traZODone  50 mg Per Tube QHS   Continuous Infusions: . sodium chloride 1,000 mL (06/06/20 0028)  . sodium chloride    . ceFEPime (MAXIPIME) IV 2 g (06/23/20 1223)  . feeding supplement (OSMOLITE 1.5 CAL) 1,000 mL (06/22/20 2209)     LOS: 69 days        Hosie Poisson, MD [facility]   To contact the attending  provider between 7A-7P or the covering provider during after hours 7P-7A, please log into the web site [email or web address] and access using universal [place] password for that web site. If you do not have the password, please call the hospital operator.  06/23/2020, 2:42 PM

## 2020-06-23 NOTE — Progress Notes (Signed)
Patient has refused CPT, RT attempted to inform the patient of the need to do his treatment but he refused.

## 2020-06-24 ENCOUNTER — Encounter (HOSPITAL_COMMUNITY): Payer: Self-pay | Admitting: Thoracic Surgery (Cardiothoracic Vascular Surgery)

## 2020-06-24 LAB — GLUCOSE, CAPILLARY
Glucose-Capillary: 162 mg/dL — ABNORMAL HIGH (ref 70–99)
Glucose-Capillary: 160 mg/dL — ABNORMAL HIGH (ref 70–99)
Glucose-Capillary: 135 mg/dL — ABNORMAL HIGH (ref 70–99)
Glucose-Capillary: 120 mg/dL — ABNORMAL HIGH (ref 70–99)

## 2020-06-24 LAB — CULTURE, BLOOD (ROUTINE X 2)
Culture: NO GROWTH
Culture: NO GROWTH
Special Requests: ADEQUATE
Special Requests: ADEQUATE

## 2020-06-24 NOTE — Progress Notes (Signed)
     [address]       [place] [address]             [phone]      afebrile Clinically stable  IR eval performed.  Awaiting pigtail placement.     Ingrid Shifrin Bary Leriche

## 2020-06-24 NOTE — Progress Notes (Signed)
PROGRESS NOTE    Evan Chavez  IRW:[id number] DOB: 05/03/1961 DOA: 05/10/2020 PCP: Rosita Fire, MD    Chief Complaint  Patient presents with  . Shortness of Breath    Brief Narrative:  59 year old gentleman with prior history of GERD, Barrett's esophagus, peptic ulcer disease with extensive gastric erosion, duodenal ulcer and esophageal stricture s/p balloon dilatation on 3/2 0 20, CVA, hyperlipidemia, BPH, remote cocaine abuse presents with weakness productive cough, fever and dyspnea was found to have community-acquired pneumonia.Hospital course complicated by right sided hydropneumothorax with possible lung collapse s/p thoracentesis on the right on 05/14/2020, esophageal perforation with right sided hydropneumothorax, complicated by aspiration pneumonia. Patient underwent right sided chest tube placement, transferred from [facility] to [facility] for VATS and decortication by CT surgery as well as esophageal stent placement and PEG placement on 05/16/20. Patient had episodes of persistent leakage from esophagus on esophagogram as well as underwent drainage of loculated pleural effusion on 05/25/20 by CT surgery. Repeat esophagogram on 05/28/2020 did not show any esophageal leak. CT chest with contrast on 9/7 showed Interval increase in a loculated right posteromedial pleural fluid collection with surrounding enhancing pleura, which is sterility indeterminate by imaging but possibly an empyema. CT surgery re consulted, suggested that residual entrapped lung and poor pulmonary toilet. Over the last 48 hours pt had low grade temp with elevated wbc count, and CT abd and pelvis shows loculated complex pleural effusion , possibly an abscess. TCTS on board and he is scheduled for chest tube placement by IR tomorrow.  Pt seen and examined at bedside.     Assessment & Plan:   Active Problems:   Essential hypertension   Atrial tachycardia (HCC)   Dyslipidemia   CAP (community acquired pneumonia)    Transaminitis   Lobar pneumonia ([place])   Sepsis due to undetermined organism ([place])   Fever   Gastroesophageal reflux disease with esophagitis   Shortness of breath   Thyroid nodule   BPH (benign prostatic hyperplasia)   Loculated pleural effusion   Ureteral stone with hydronephrosis   Pneumothorax on right   Acute respiratory failure with hypoxia (HCC)   Chronic bilateral pleural effusions   Parapneumonic effusion   Malnutrition of moderate degree   Esophageal perforation   Pressure injury of skin   Atrial fibrillation with RVR (HCC)   Esophageal anastomotic leak   Atelectasis   Right lower lobe pulmonary infiltrate   Sinus tachycardia   Dysphagia   Respiratory distress   Status post thoracentesis   Acute respiratory failure with hypoxia secondary to community-acquired pneumonia, Parapneumonic effusion. Patient underwent chest tube placement initially by cardiothoracic surgery.  Pleural fluid culture significant for Candida parapsilosis.  He completed the course of cefepime and fluconazole. He was weaned off oxygen.  Patient remains on RA with good sats, but he remains tachypneic and tachycardic.  On 9/9- 9/10 pt had fevers, wbc count, tachypnea, he was started on IV cefepime for possible lung abscess. Dr Sunday Corn foot with TCTS recommended CT guided chest tube by IR, he is scheduled tomorrow.    Hydropneumothorax s/p chest tube placement by CT surgery.  Resolved.    Sepsis present on admission:  In the setting of aspiration pneumonia, empyema and mediastinitis. Patient continues to have some residual tachycardia and tachypnea.    Lung abscess; Fevers , leukocytosis, started him IV cefepime. CT guided chest tube placement scheduled for tomorrow.    Esophageal rupture s/p stent placement by CT surgery.    Oropharyngeal dysphagia s/p PEG  placement on tube feeds.   SLP evaluation recommending clear liquid diet for now, currently NPO for the procedure inam.     History  of paroxysmal atrial fibrillation Sinus tachycardia improved with increase in metoprolol.  CT angiogram is negative for PE. Continue with metoprolol for rate control and Eliquis for anticoagulation . Last echocardiogram showed Left ventricular ejection fraction, by estimation, is 65 to 70%. The  left ventricle has normal function. The left ventricle has no regional  wall motion abnormalities. Left ventricular diastolic parameters are consistent with Grade I diastolic  dysfunction (impaired relaxation).    Acute on chronic blood loss anemia in the setting of blood loss from esophageal rupture/postop anemia/ microcytic anemia:  in addition to hematemesis which have all resolved.  Currently hemoglobin is at 8.4   Chronic sinus tachycardia Heart rates between 90s to110/min.  Increased metoprolol to 100 mg BID.     History of stroke with residual right-sided weakness.   Patient was on Lipitor as an outpatient which was being held for elevated liver enzymes. Recheck liver enzymes in 2 weeks and restart lipitor if liver enzymes wnl.    Left hydronephrosis with ureteral stone. CT abg shows persistent left hydronephrosis.  -Outpatient follow-up with urology for shockwave lithotripsy.   Hyponatremia Sodium at 131.   Depression Continue with Prozac daily.     DVT prophylaxis: Eliquis.  Code Status: (Full code) Family Communication: discussed with mother over the phone.  Disposition:   Status is: Inpatient  Remains inpatient appropriate because:Ongoing diagnostic testing needed not appropriate for outpatient work up, Unsafe d/c plan and Inpatient level of care appropriate due to severity of illness, CT guided chest tube placement in am. .    Dispo:  Patient From: Home  Planned Disposition: [place]  Expected discharge date: 06/27/2020  Medically stable for discharge:   No        Consultants:   [place].    Procedures:    VATS/DECORTICATION/PEG PLACEMENT/ESOPHAGEAL STENT PLACEMENT (05/17/2020)  RIGHT THORACOSCOPY/SOPHAGOGASTROSCOPY/ESOPHAGEAL STENT PLACEMENT/ESOPHAGRAM   Antimicrobials:  Anti-infectives (From admission, onward)   Start     Dose/Rate Route Frequency Ordered Stop   06/22/20 2030  ceFEPIme (MAXIPIME) 2 g in sodium chloride 0.9 % 100 mL IVPB        2 g 200 mL/hr over 30 Minutes Intravenous Every 8 hours 06/22/20 2020     06/03/20 0200  vancomycin (VANCOCIN) IVPB 1000 mg/200 mL premix  Status:  Discontinued        1,000 mg 200 mL/hr over 60 Minutes Intravenous Every 12 hours 06/02/20 1248 06/05/20 0915   06/02/20 2200  ceFEPIme (MAXIPIME) 2 g in sodium chloride 0.9 % 100 mL IVPB        2 g 200 mL/hr over 30 Minutes Intravenous Every 8 hours 06/02/20 1251 06/08/20 2359   06/02/20 2000  piperacillin-tazobactam (ZOSYN) IVPB 3.375 g  Status:  Discontinued        3.375 g 12.5 mL/hr over 240 Minutes Intravenous Every 8 hours 06/02/20 1248 06/02/20 1249   06/02/20 1330  vancomycin (VANCOREADY) IVPB 1250 mg/250 mL        1,250 mg 166.7 mL/hr over 90 Minutes Intravenous  Once 06/02/20 1248 06/02/20 1646   06/02/20 1330  piperacillin-tazobactam (ZOSYN) IVPB 3.375 g  Status:  Discontinued        3.375 g 100 mL/hr over 30 Minutes Intravenous  Once 06/02/20 1248 06/02/20 1249   06/02/20 1215  vancomycin (VANCOCIN) IVPB 1000 mg/200 mL premix  Status:  Discontinued        1,000 mg 200 mL/hr over 60 Minutes Intravenous  Once 06/02/20 1202 06/02/20 1251   06/02/20 1215  ceFEPIme (MAXIPIME) 2 g in sodium chloride 0.9 % 100 mL IVPB        2 g 200 mL/hr over 30 Minutes Intravenous  Once 06/02/20 1202 06/02/20 1506   05/29/20 1800  vancomycin (VANCOCIN) IVPB 1000 mg/200 mL premix  Status:  Discontinued        1,000 mg 200 mL/hr over 60 Minutes Intravenous Every 12 hours 05/29/20 1151 05/30/20 0912   05/24/20 2100  vancomycin (VANCOREADY) IVPB 750 mg/150 mL  Status:  Discontinued        750 mg 150 mL/hr  over 60 Minutes Intravenous Every 12 hours 05/24/20 0813 05/29/20 1151   05/24/20 0900  vancomycin (VANCOREADY) IVPB 1500 mg/300 mL        1,500 mg 150 mL/hr over 120 Minutes Intravenous  Once 05/24/20 0813 05/24/20 1221   05/17/20 0900  fluconazole (DIFLUCAN) IVPB 400 mg  Status:  Discontinued        400 mg 100 mL/hr over 120 Minutes Intravenous Every 24 hours 05/17/20 0749 06/05/20 0915   05/17/20 0600  vancomycin (VANCOREADY) IVPB 750 mg/150 mL  Status:  Discontinued        750 mg 150 mL/hr over 60 Minutes Intravenous Every 8 hours 05/16/20 2154 05/17/20 0903   05/17/20 0400  vancomycin (VANCOREADY) IVPB 750 mg/150 mL  Status:  Discontinued        750 mg 150 mL/hr over 60 Minutes Intravenous Every 8 hours 05/16/20 1945 05/16/20 2154   05/16/20 2200  vancomycin (VANCOREADY) IVPB 1250 mg/250 mL        1,250 mg 166.7 mL/hr over 90 Minutes Intravenous STAT 05/16/20 2153 05/16/20 2348   05/16/20 2000  vancomycin (VANCOREADY) IVPB 1250 mg/250 mL  Status:  Discontinued        1,250 mg 166.7 mL/hr over 90 Minutes Intravenous  Once 05/16/20 1945 05/16/20 2153   05/15/20 1600  piperacillin-tazobactam (ZOSYN) IVPB 3.375 g  Status:  Discontinued        3.375 g 12.5 mL/hr over 240 Minutes Intravenous Every 8 hours 05/15/20 0945 05/30/20 0912   05/15/20 0930  piperacillin-tazobactam (ZOSYN) IVPB 3.375 g        3.375 g 100 mL/hr over 30 Minutes Intravenous  Once 05/15/20 0921 05/15/20 1009   05/13/20 2000  azithromycin (ZITHROMAX) tablet 500 mg        500 mg Oral Daily 05/13/20 1155 05/14/20 2127   05/10/20 2200  cefTRIAXone (ROCEPHIN) 2 g in sodium chloride 0.9 % 100 mL IVPB  Status:  Discontinued        2 g 200 mL/hr over 30 Minutes Intravenous Every 24 hours 05/10/20 2148 05/15/20 0911   05/10/20 2200  azithromycin (ZITHROMAX) 500 mg in sodium chloride 0.9 % 250 mL IVPB  Status:  Discontinued        500 mg 250 mL/hr over 60 Minutes Intravenous Every 24 hours 05/10/20 2148 05/13/20 1155        Subjective: Patient denies any new complaints. He nods NO to sob, chest pain. No vomiting or diarrhea.  Objective: Vitals:   06/23/20 2300 06/24/20 0300 06/24/20 1200 06/24/20 1300  BP: 119/69  122/78 123/78  Pulse: 99 (!) 102    Resp: (!) 21 (!) 29 (!) 38 (!) 24  Temp: 97.8 F (36.6 C) 98.8 F (37.1 C)  98.4 F (36.9 C)  TempSrc:  Oral  Oral  SpO2:  99%  95%  Weight:  60 kg    Height:       No intake or output data in the 24 hours ending 06/24/20 1409 Filed Weights   06/22/20 0700 06/23/20 0359 06/24/20 0300  Weight: 59.3 kg 59.4 kg 60 kg    Examination:  General exam: Alert and comfortable Respiratory system: Diminished air entry at bases, no wheezing or rhonchi tachypnea present Cardiovascular system: S1-S2 heard, tachycardic, no JVD, no pedal edema. Gastrointestinal system: Abdomen is soft, nontender, nondistended, bowel sounds within normal limits Central nervous system: Alert, able to say yes and no to questions able to move all extremities. Extremities: No pedal edema Skin: No rashes seen Psychiatry: Mood is appropriate    Data Reviewed: I have personally reviewed following labs and imaging studies  CBC: Recent Labs  Lab 06/18/20 1209 06/20/20 0801 06/22/20 1444 06/23/20 0700  WBC 11.0* 10.4 14.5* 13.4*  NEUTROABS  --   --  11.9*  --   HGB 9.4* 9.1* 9.5* 8.4*  HCT 31.7* 30.8* 32.2* 28.7*  MCV 82.1 81.1 82.1 79.9*  PLT 405* 434* 466* 421*    Basic Metabolic Panel: Recent Labs  Lab 06/19/20 0146 06/20/20 0801 06/22/20 1444 06/23/20 0700  NA 133* 133* 133* 131*  K 4.7 4.1 4.6 4.3  CL 97* 97* 94* 96*  CO2 29 26 30 25   GLUCOSE 132* 157* 141* 143*  BUN 12 11 12 11   CREATININE 0.68 0.65 0.85 0.84  CALCIUM 8.9 8.8* 8.9 8.4*    GFR: Estimated Creatinine Clearance: 79.4 mL/min (by C-G formula based on SCr of 0.84 mg/dL).  Liver Function Tests: Recent Labs  Lab 06/20/20 0801 06/23/20 0700  AST 61* 45*  ALT 198* 158*  ALKPHOS 145*  144*  BILITOT 0.4 0.4  PROT 7.4 7.3  ALBUMIN 2.2* 2.0*    CBG: Recent Labs  Lab 06/23/20 1110 06/23/20 1539 06/23/20 2122 06/24/20 0632 06/24/20 1146  GLUCAP 194* 142* 140* 162* 160*     Recent Results (from the past 240 hour(s))  Culture, blood (routine x 2)     Status: None (Preliminary result)   Collection Time: 06/19/20  2:30 PM   Specimen: BLOOD LEFT ARM  Result Value Ref Range Status   Specimen Description BLOOD LEFT ARM  Final   Special Requests   Final    BOTTLES DRAWN AEROBIC AND ANAEROBIC Blood Culture adequate volume   Culture   Final    NO GROWTH 4 DAYS Performed at [facility], [place] [address], [place], [place] [address]    Report Status PENDING  Incomplete  Culture, blood (routine x 2)     Status: None (Preliminary result)   Collection Time: 06/19/20  2:35 PM   Specimen: BLOOD RIGHT HAND  Result Value Ref Range Status   Specimen Description BLOOD RIGHT HAND  Final   Special Requests   Final    BOTTLES DRAWN AEROBIC AND ANAEROBIC Blood Culture adequate volume   Culture   Final    NO GROWTH 4 DAYS Performed at [facility], [place] [address], [place], [place] [address]    Report Status PENDING  Incomplete  SARS Coronavirus 2 by RT PCR (hospital order, performed in [facility] hospital lab) Nasopharyngeal Nasopharyngeal Swab     Status: None   Collection Time: 06/21/20  4:20 PM   Specimen: Nasopharyngeal Swab  Result Value Ref Range Status   SARS Coronavirus 2 NEGATIVE NEGATIVE Final  Comment: (NOTE) SARS-CoV-2 target nucleic acids are NOT DETECTED.  The SARS-CoV-2 RNA is generally detectable in upper and lower respiratory specimens during the acute phase of infection. The lowest concentration of SARS-CoV-2 viral copies this assay can detect is 250 copies / mL. A negative result does not preclude SARS-CoV-2 infection and should not be used as the sole basis for treatment or other patient management decisions.  A negative result may occur  with improper specimen collection / handling, submission of specimen other than nasopharyngeal swab, presence of viral mutation(s) within the areas targeted by this assay, and inadequate number of viral copies (<250 copies / mL). A negative result must be combined with clinical observations, patient history, and epidemiological information.  Fact Sheet for Patients:   StrictlyIdeas.no  Fact Sheet for Healthcare Providers: BankingDealers.co.za  This test is not yet approved or  cleared by the [place] FDA and has been authorized for detection and/or diagnosis of SARS-CoV-2 by FDA under an Emergency Use Authorization (EUA).  This EUA will remain in effect (meaning this test can be used) for the duration of the COVID-19 declaration under Section 564(b)(1) of the Act, 21 U.S.C. section 360bbb-3(b)(1), unless the authorization is terminated or revoked sooner.  Performed at [facility], [place] [address], [place], [place] [address]   Culture, blood (Routine X 2) w Reflex to ID Panel     Status: None (Preliminary result)   Collection Time: 06/22/20  9:54 AM   Specimen: BLOOD RIGHT HAND  Result Value Ref Range Status   Specimen Description BLOOD RIGHT HAND  Final   Special Requests   Final    BOTTLES DRAWN AEROBIC ONLY Blood Culture adequate volume   Culture   Final    NO GROWTH 1 DAY Performed at [facility], [place] [address], [place], [place] [address]    Report Status PENDING  Incomplete  Culture, blood (Routine X 2) w Reflex to ID Panel     Status: None (Preliminary result)   Collection Time: 06/22/20 10:01 AM   Specimen: BLOOD LEFT ARM  Result Value Ref Range Status   Specimen Description BLOOD LEFT ARM  Final   Special Requests   Final    BOTTLES DRAWN AEROBIC AND ANAEROBIC Blood Culture adequate volume   Culture   Final    NO GROWTH 1 DAY Performed at [facility], [place] [address] [place], [place]  [address]    Report Status PENDING  Incomplete  MRSA PCR Screening     Status: Abnormal   Collection Time: 06/23/20 12:30 PM   Specimen: Nasopharyngeal  Result Value Ref Range Status   MRSA by PCR POSITIVE (A) NEGATIVE Final    Comment:        The GeneXpert MRSA Assay (FDA approved for NASAL specimens only), is one component of a comprehensive MRSA colonization surveillance program. It is not intended to diagnose MRSA infection nor to guide or monitor treatment for MRSA infections. RESULT CALLED TO, READ BACK BY AND VERIFIED WITH: L. LEE RN, AT 7035 06/23/20 [place] Performed at [facility], [place] [address] [place], [place] [address]          Radiology Studies: CT ABDOMEN PELVIS W CONTRAST  Result Date: 06/22/2020 CLINICAL DATA:  Nausea and vomiting, vomited tube feedings EXAM: CT ABDOMEN AND PELVIS WITH CONTRAST TECHNIQUE: Multidetector CT imaging of the abdomen and pelvis was performed using the standard protocol following bolus administration of intravenous contrast. CONTRAST:  136mL OMNIPAQUE  IOHEXOL 300 MG/ML  SOLN COMPARISON:  06/19/2020, 05/13/2020 FINDINGS: Lower chest: Persistent complex loculated right pleural fluid collection, suspicious for empyema/abscess given history of esophageal perforation. Trace free-flowing right pleural fluid. Significant consolidation of the right lower lobe consistent with atelectasis. Esophageal stent identified, with fluid and debris within the esophageal lumen. Hepatobiliary: No focal liver abnormality is seen. No gallstones, gallbladder wall thickening, or biliary dilatation. Pancreas: Unremarkable. No pancreatic ductal dilatation or surrounding inflammatory changes. Spleen: Normal in size without focal abnormality. Adrenals/Urinary Tract: Persistent left-sided hydronephrosis related to an obstructing 11 mm proximal left ureteral calculus. Multiple punctate nonobstructing left renal calculi again identified unchanged. Punctate  nonobstructing right renal calculi are identified. Right kidney enhances normally. No right-sided obstruction. Bladder is unremarkable.  The adrenals are normal. Stomach/Bowel: Percutaneous gastrostomy tube is identified. No bowel obstruction or ileus. No bowel wall thickening or inflammatory change. Vascular/Lymphatic: Aortic atherosclerosis. No enlarged abdominal or pelvic lymph nodes. Reproductive: Prostate is unremarkable. Other: No free fluid or free intraperitoneal gas. No abdominal wall hernia. Musculoskeletal: No acute or destructive bony lesions. Reconstructed images demonstrate no additional findings. IMPRESSION: 1. Persistent complex loculated right pleural fluid collection, concerning for abscess. Persistent esophageal perforation cannot be excluded in light of previous findings. 2. Stable esophageal stent, with debris and fluid in the esophageal lumen as before. 3. Persistent left-sided hydronephrosis related to an 11 mm obstructing proximal left ureteral calculus. 4. Other bilateral nonobstructing renal calculi 5.  Aortic Atherosclerosis (ICD10-I70.0). Electronically Signed   By: Randa Ngo M.D.   On: 06/22/2020 19:38        Scheduled Meds: . apixaban  5 mg Oral BID  . Chlorhexidine Gluconate Cloth  6 each Topical Q0600  . feeding supplement (PROSource TF)  45 mL Per Tube BID  . FLUoxetine  20 mg Per Tube Daily  . insulin aspart  0-24 Units Subcutaneous TID AC & HS  . mouth rinse  15 mL Mouth Rinse q12n4p  . metoprolol tartrate  100 mg Per Tube BID  . mupirocin ointment  1 application Nasal BID  . pantoprazole sodium  40 mg Per Tube BID  . sennosides  5 mL Per Tube QHS  . traZODone  50 mg Per Tube QHS   Continuous Infusions: . sodium chloride 1,000 mL (06/06/20 0028)  . sodium chloride    . ceFEPime (MAXIPIME) IV 2 g (06/24/20 1200)  . feeding supplement (OSMOLITE 1.5 CAL) 1,000 mL (06/23/20 1731)     LOS: 44 days        Hosie Poisson, MD [facility]   To  contact the attending provider between 7A-7P or the covering provider during after hours 7P-7A, please log into the web site [email or web address] and access using universal [place] password for that web site. If you do not have the password, please call the hospital operator.  06/24/2020, 2:09 PM

## 2020-06-24 NOTE — Progress Notes (Signed)
Chief Complaint: Patient was seen in consultation today for right chest tube   Referring Physician(s): Dr. Kipp Brood  Supervising Physician: Jacqulynn Cadet  Patient Status: [facility] - In-pt  History of Present Illness: Evan Chavez is a 59 y.o. male with complex history and hospitalization.  Patient underwent right sided chest tube placement, transferred [facility] to [facility] for VATS and decortication by CT surgery as well as esophageal stent placement and PEG placement on 05/16/20. Patient had episodes of persistent leakage from esophagus on esophagogram as well as underwent drainage of loculated pleural effusion on 05/25/20 by CT surgery. Repeat esophagogram on 05/28/2020 did not show any esophageal leak. CT chest with contrast on 9/7 showed Interval increase in a loculated right posteromedial pleural fluid collection with surrounding enhancing pleura, which is sterility indeterminate by imaging but possibly an empyema. CT surgery re consulted, suggested that residual entrapped lung and poor pulmonary toilet. Given mild fevers and persistent leukocytosis, IR is now asked to place perc chest tube. PMHx, meds, labs, imaging, allergies reviewed.     Past Medical History:  Diagnosis Date  . Barrett's esophagus   . Chronic anemia   . Cocaine abuse ([place])   . Duodenal ulcer   . Esophageal stricture   . ETOH abuse   . GERD (gastroesophageal reflux disease)   . Hyperlipidemia   . Hypertension   . Peptic ulcer   . Pneumonia   . Stroke (cerebrum) ([place]) 06/2018   right sided weakness    Past Surgical History:  Procedure Laterality Date  . BIOPSY  08/25/2018   Procedure: BIOPSY;  Surgeon: [name], MD;  [facility];  Service: Endoscopy;;  . BIOPSY  12/13/2018   Procedure: BIOPSY;  Surgeon: [name], MD;  [facility];  Service: Endoscopy;;  esophagus  . COLONOSCOPY WITH PROPOFOL N/A 04/07/2019   Dr. [name]: 3 Tubular adenomas removed (4 to 8 mm in  size), next colonoscopy 3 years.  . ESOPHAGEAL STENT PLACEMENT N/A 05/17/2020   Procedure: ESOPHAGEAL STENT PLACEMENT AND  PEG TUBE PLACEMENT;  Surgeon: Lajuana Matte, MD;  Location: [place];  Service: Thoracic;  Laterality: N/A;  . ESOPHAGEAL STENT PLACEMENT N/A 05/25/2020   Procedure: ESOPHAGEAL STENT PLACEMENT USING A 23 MM COVERED STENT;  Surgeon: Lajuana Matte, MD;  Location: [place];  Service: Thoracic;  Laterality: N/A;  . ESOPHAGOGASTRODUODENOSCOPY N/A 05/17/2020   Procedure: ESOPHAGOGASTRODUODENOSCOPY (EGD);  Surgeon: Lajuana Matte, MD;  Location: [place];  Service: Thoracic;  Laterality: N/A;  . ESOPHAGOGASTRODUODENOSCOPY N/A 05/25/2020   Procedure: ESOPHAGOGASTRODUODENOSCOPY (EGD); ESOPHAGEAL STENT REMOVAL.;  Surgeon: Lajuana Matte, MD;  Location: [facility] OR;  Service: Thoracic;  Laterality: N/A;  . ESOPHAGOGASTRODUODENOSCOPY (EGD) WITH PROPOFOL N/A 08/25/2018   Dr. [name]: Severely inflamed abnormal appearing mid/distal esophagus.  Encroachment on lumen suspicious for infiltrating neoplasm located be all from severe benign inflammation, extensive gastric erosions and extensive duodenal ulcerations.  Query ischemic process versus other.  Biopsy revealed Barrett's esophagus but negative for malignancy and H. pylori.  . ESOPHAGOGASTRODUODENOSCOPY (EGD) WITH PROPOFOL N/A 12/13/2018   Dr. [name]: peptic stricture s/p balloon dilatation. abnormal esophageal mucosa, by c/w barrett's without dysplasia, previous PUD completely healed.   Marland Kitchen POLYPECTOMY  04/07/2019   Procedure: POLYPECTOMY;  Surgeon: [name], MD;  [facility];  Service: Endoscopy;;  colon  . VIDEO ASSISTED THORACOSCOPY (VATS)/DECORTICATION Right 05/17/2020   Procedure: RIGHT VIDEO ASSISTED THORACOSCOPY (VATS)/DECORTICATION;  Surgeon: Lajuana Matte, MD;  Location: [facility]  OR;  Service: Thoracic;  Laterality: Right;  Marland Kitchen VIDEO ASSISTED THORACOSCOPY (VATS)/EMPYEMA Right 05/25/2020   Procedure: VIDEO ASSISTED  THORACOSCOPY (VATS)/EMPYEMA;  Surgeon: Lajuana Matte, MD;  Location: [place];  Service: Thoracic;  Laterality: Right;    Allergies: Patient has no known allergies.  Medications:  Current Facility-Administered Medications:  .  0.9 %  sodium chloride infusion, , Intravenous, PRN, Elgie Collard, PA-C, Last Rate: 10 mL/hr at 06/06/20 0028, 1,000 mL at 06/06/20 0028 .  [CANCELED] Place/Maintain arterial line, , , Until Discontinued **AND** 0.9 %  sodium chloride infusion, , Intra-arterial, PRN, Conte, Tessa N, PA-C .  acetaminophen (TYLENOL) 160 MG/5ML solution 650 mg, 650 mg, Oral, Q6H PRN, Zierle-Ghosh, Asia B, DO, 650 mg at 06/23/20 0950 .  apixaban (ELIQUIS) tablet 5 mg, 5 mg, Oral, BID, Carney, Gay Filler, RPH, 5 mg at 06/24/20 0946 .  ceFEPIme (MAXIPIME) 2 g in sodium chloride 0.9 % 100 mL IVPB, 2 g, Intravenous, Q8H, von Dohlen, Haley B, RPH, Last Rate: 200 mL/hr at 06/24/20 0449, 2 g at 06/24/20 0449 .  Chlorhexidine Gluconate Cloth 2 % PADS 6 each, 6 each, Topical, Q0600, Hosie Poisson, MD, 6 each at 06/24/20 0544 .  feeding supplement (OSMOLITE 1.5 CAL) liquid 1,000 mL, 1,000 mL, Per Tube, Continuous, Oretha Milch D, MD, Last Rate: 60 mL/hr at 06/23/20 1731, 1,000 mL at 06/23/20 1731 .  feeding supplement (PROSource TF) liquid 45 mL, 45 mL, Per Tube, BID, Lightfoot, Harrell O, MD, 45 mL at 06/24/20 0947 .  fentaNYL (SUBLIMAZE) injection 25 mcg, 25 mcg, Intravenous, Q2H PRN, Elgie Collard, PA-C, 25 mcg at 06/01/20 2132 .  FLUoxetine (PROZAC) capsule 20 mg, 20 mg, Per Tube, Daily, Mariel Aloe, MD, 20 mg at 06/24/20 0946 .  guaiFENesin-dextromethorphan (ROBITUSSIN DM) 100-10 MG/5ML syrup 10 mL, 10 mL, Per Tube, Q4H PRN, Hosie Poisson, MD .  hydrALAZINE (APRESOLINE) injection 10 mg, 10 mg, Intravenous, Q4H PRN, Conte, Tessa N, PA-C .  CBG monitoring, , , 4x Daily, AC & HS **AND** insulin aspart (novoLOG) injection 0-24 Units, 0-24 Units, Subcutaneous, TID AC & HS, Elgie Collard,  PA-C, 4 Units at 06/24/20 [phone] .  ipratropium-albuterol (DUONEB) 0.5-2.5 (3) MG/3ML nebulizer solution 3 mL, 3 mL, Nebulization, Q4H PRN, Harriet Pho, Tessa N, PA-C, 3 mL at 05/31/20 0925 .  MEDLINE mouth rinse, 15 mL, Mouth Rinse, q12n4p, Conte, Tessa N, PA-C, 15 mL at 06/23/20 1640 .  metoprolol tartrate (LOPRESSOR) 25 mg/10 mL oral suspension 100 mg, 100 mg, Per Tube, BID, Hosie Poisson, MD, 100 mg at 06/24/20 0947 .  metoprolol tartrate (LOPRESSOR) injection 5 mg, 5 mg, Intravenous, Q4H PRN, Conte, Tessa N, PA-C, 5 mg at 06/23/20 0732 .  mupirocin ointment (BACTROBAN) 2 % 1 application, 1 application, Nasal, BID, Hosie Poisson, MD, 1 application at 26/33/35 2134 .  ondansetron (ZOFRAN) injection 4 mg, 4 mg, Intravenous, Q6H PRN, Harriet Pho, Tessa N, PA-C, 4 mg at 06/22/20 1745 .  oxyCODONE (ROXICODONE) 5 MG/5ML solution 5 mg, 5 mg, Per Tube, Q6H PRN, Elgie Collard, PA-C, 5 mg at 06/05/20 2221 .  pantoprazole sodium (PROTONIX) 40 mg/20 mL oral suspension 40 mg, 40 mg, Per Tube, BID, Georgette Shell, MD, 40 mg at 06/24/20 0947 .  phenol (CHLORASEPTIC) mouth spray 1 spray, 1 spray, Mouth/Throat, PRN, Lightfoot, Lucile Crater, MD, 1 spray at 06/21/20 1650 .  sennosides (SENOKOT) 8.8 MG/5ML syrup 5 mL, 5 mL, Per Tube, QHS, Conte, Tessa N, PA-C, 5 mL at 06/22/20 2131 .  traMADol (ULTRAM) tablet 25 mg, 25 mg, Per Tube, Q6H PRN, Lajuana Matte, MD, 25 mg at 06/10/20 1847 .  traZODone (DESYREL) tablet 50 mg, 50 mg, Per Tube, QHS, Mariel Aloe, MD, 50 mg at 06/23/20 2134    Family History  Problem Relation Age of Onset  . Hypertension Father   . Heart attack Father   . Heart disease Father   . Hypertension Mother   . GER disease Mother   . Thyroid disease Mother   . Colon cancer Neg Hx   . Colon polyps Neg Hx     Social History   Socioeconomic History  . Marital status: Single    Spouse name: Not on file  . Number of children: Not on file  . Years of education: Not on file  . Highest  education level: Not on file  Occupational History  . Not on file  Tobacco Use  . Smoking status: Former Smoker    Packs/day: 1.00    Years: 40.00    Pack years: 40.00    Types: Cigarettes    Quit date: 06/2018    Years since quitting: 2.0  . Smokeless tobacco: Never Used  Vaping Use  . Vaping Use: Never used  Substance and Sexual Activity  . Alcohol use: Not Currently    Alcohol/week: 12.0 standard drinks    Types: 12 Cans of beer per week    Comment: none since 06/2018  . Drug use: Not Currently    Types: Cocaine    Comment: last cocaine use 07/12/18  . Sexual activity: Yes  Other Topics Concern  . Not on file  Social History Narrative  . Not on file   Social Determinants of Health   Financial Resource Strain:   . Difficulty of Paying Living Expenses: Not on file  Food Insecurity:   . Worried About Charity fundraiser in the Last Year: Not on file  . Ran Out of Food in the Last Year: Not on file  Transportation Needs:   . Lack of Transportation (Medical): Not on file  . Lack of Transportation (Non-Medical): Not on file  Physical Activity:   . Days of Exercise per Week: Not on file  . Minutes of Exercise per Session: Not on file  Stress:   . Feeling of Stress : Not on file  Social Connections:   . Frequency of Communication with Friends and Family: Not on file  . Frequency of Social Gatherings with Friends and Family: Not on file  . Attends Religious Services: Not on file  . Active Member of Clubs or Organizations: Not on file  . Attends Archivist Meetings: Not on file  . Marital Status: Not on file    Review of Systems: A 12 point ROS discussed and pertinent positives are indicated in the HPI above.  All other systems are negative.  Review of Systems  Vital Signs: BP 119/69   Pulse (!) 102   Temp 98.8 F (37.1 C) (Oral)   Resp (!) 29   Ht 5\' 9"  (1.753 m)   Wt 60 kg   SpO2 99%   BMI 19.53 kg/m   Physical Exam Constitutional:       General: He is not in acute distress.    Appearance: Normal appearance. He is not ill-appearing or toxic-appearing.  HENT:     Mouth/Throat:     Mouth: Mucous membranes are moist.     Pharynx: Oropharynx is clear.  Cardiovascular:  Rate and Rhythm: Normal rate. Rhythm irregular.     Heart sounds: Normal heart sounds.  Pulmonary:     Effort: Pulmonary effort is normal. No respiratory distress.     Comments: Diminished (R)lower BS Skin:    General: Skin is warm and dry.  Neurological:     General: No focal deficit present.     Mental Status: He is alert and oriented to person, place, and time.  Psychiatric:        Mood and Affect: Mood normal.        Thought Content: Thought content normal.     Imaging: DG Chest 1 View  Result Date: 06/10/2020 CLINICAL DATA:  Fever and shortness of breath. EXAM: CHEST  1 VIEW COMPARISON:  June 04, 2020 FINDINGS: Stable esophageal stent. Opacity and effusion in the right base remains, more focal medially on this study. The cardiomediastinal silhouette is stable. No pneumothorax. The left lung is clear. No other acute abnormalities. IMPRESSION: 1. Stable esophageal stent. 2. The opacity in the right base is more focal medially. There is some volume loss suggesting a component of atelectasis. Likely associated effusion. Electronically Signed   By: Dorise Bullion III M.D   On: 06/10/2020 15:46   DG Chest 1 View  Result Date: 05/26/2020 CLINICAL DATA:  Esophageal anastomotic leak EXAM: CHEST  1 VIEW COMPARISON:  05/25/2020 FINDINGS: Right chest tubes and esophageal stent are again noted. There is new near total opacification of the right hemithorax. Right pleural effusion is present. Left lung remains clear. No pneumothorax. Cardiomediastinal contours are partially obscured. There is mild rightward mediastinal shift. IMPRESSION: New near total opacification of the right hemithorax likely reflecting combination of effusion and atelectasis. Electronically  Signed   By: Macy Mis M.D.   On: 05/26/2020 08:21   DG Chest 1 View  Result Date: 05/25/2020 CLINICAL DATA:  Esophageal stent placement. EXAM: DG C-ARM 1-60 MIN; CHEST  1 VIEW FLUOROSCOPY TIME:  Fluoro time: 4 Minutes 17 secondsRadiation exposure index: MGy: 41.69Number of acquired spot images: Five spot fluoro graphic images. COMPARISON:  May 17, 2020.  May 23, 2020 FINDINGS: Spot fluoroscopy images were obtained for surgical planning purposes. Esophageal stent is seen traversing the thorax. Enteric contrast traverses the stent and enters the proximal stomach. None of the provided radiographs demonstrate evaluation of the site of previous leak into the RIGHT pleural space. Selective LEFT mainstem intubation. IMPRESSION: Spot fluoroscopy images from surgical planning purposes. Please reference procedure report for further details. Electronically Signed   By: Valentino Saxon MD   On: 05/25/2020 10:59   DG Chest 2 View  Result Date: 06/22/2020 CLINICAL DATA:  Fevers, history of esophageal stent placement EXAM: CHEST - 2 VIEW COMPARISON:  06/18/2020, CT from 06/19/2020 FINDINGS: Cardiac shadow is mildly prominent but stable. Mid to distal esophageal stent is again seen and stable. The left lung remains clear. Right lung demonstrates small pleural effusion as well as right lower lobe consolidation. The overall appearance is similar to that seen on the prior exam. A portion of this is shown to represent loculated fluid on recent CT. No bony abnormality is noted. Gastrostomy catheter is noted in the upper abdomen. IMPRESSION: Persistent right-sided effusion and opacity a portion of which is related to loculated fluid. The overall appearance is similar to that seen on prior exam. Electronically Signed   By: Inez Catalina M.D.   On: 06/22/2020 09:35   CT CHEST W CONTRAST  Result Date: 06/19/2020 CLINICAL DATA:  Pneumonia.  Effusion or abscess suspected. EXAM: CT CHEST WITH CONTRAST TECHNIQUE:  Multidetector CT imaging of the chest was performed during intravenous contrast administration. CONTRAST:  26mL OMNIPAQUE IOHEXOL 300 MG/ML  SOLN COMPARISON:  Chest radiographs 06/18/2020, CT chest 06/02/2020 FINDINGS: Cardiovascular: The heart size is upper limits of normal, similar to prior. Coronary artery calcifications. No evidence of pericardial effusion or aortic aneurysm. Mediastinum/Nodes: Redemonstrated esophageal stent with debris/fluid layering within the esophagus. There is stranding of the posterior mediastinal fat adjacent to the esophageal stent. No discrete fluid collection identified within the mediastinum. Previously described the described gas along the right aspect of the stent has resolved. There is a small amount of air adjacent to the left aspect of the stent distally and along the right proximal aspect of the stent. Mildly prominent mediastinal and right hilar lymph nodes, most likely reactive. Lungs/Pleura: Interval increase in a posteromedial loculated right pleural collection, which now measures up to 7.8 by 5.0 cm. The collection has a few punctate areas of internal gas surrounding pleura is enhancing. Overlying linear opacities, compatible with compressive atelectasis. Otherwise, the lungs are clear. Upper Abdomen: Probable moderate left hydronephrosis, partially imaged but grossly similar to prior stress CT. Musculoskeletal: No chest wall abnormality. No acute or significant osseous findings. IMPRESSION: 1. Interval increase in a loculated right posteromedial pleural fluid collection with surrounding enhancing pleura, which is sterility indeterminate by imaging but possibly an empyema. 2. Redemonstrated esophageal stent with layering debris/fluid. Stranding in the surrounding posterior mediastinum could be postsurgical or relate to persistent esophageal leak and/or mediastinitis. No discrete drainable mediastinal fluid collection. 3. Similar small right pleural effusion with overlying  compressive atelectasis. Electronically Signed   By: Margaretha Sheffield MD   On: 06/19/2020 10:31   CT CHEST W CONTRAST  Result Date: 06/02/2020 CLINICAL DATA:  59 year old male with recent esophageal perforation with esophageal stent placement and VATS for RIGHT empyema. EXAM: CT CHEST WITH CONTRAST TECHNIQUE: Multidetector CT imaging of the chest was performed during intravenous contrast administration. CONTRAST:  73mL OMNIPAQUE IOHEXOL 300 MG/ML  SOLN COMPARISON:  05/24/2020 CT FINDINGS: Cardiovascular: UPPER limits normal heart size again noted. Heavy coronary artery calcifications are again identified. There is no evidence of thoracic aortic aneurysm or pericardial effusion. Mediastinum/Nodes: Esophageal stent is again noted. A small amount of paraesophageal gas is again noted and connects to the posteromedial loculated RIGHT pleural collection which measures 2.8 x 7.7 cm in greatest diameter, previously 4.2 x 7.7 cm. No new findings are noted. Mildly prominent mediastinal and RIGHT hilar lymph nodes are likely reactive. Lungs/Pleura: A RIGHT thoracostomy tube courses through the 2.8 x 7.7 cm posteromedial loculated RIGHT pleural collection with tip located 8 cm SUPERIOR to the collection. A small RIGHT pleural effusion is noted. Moderate RIGHT LOWER lung atelectasis is present. There is no evidence of pneumothorax. Upper Abdomen: Probable moderate LEFT hydronephrosis again noted. Musculoskeletal: No acute or suspicious bony abnormalities are present. IMPRESSION: 1. Slightly decreased size of posteromedial RIGHT pleural collection/empyema from 05/24/2020. The RIGHT thoracostomy tube courses through this collection with tip located 8 cm superiorly. 2. Small RIGHT pleural effusion and moderate RIGHT LOWER lung atelectasis. 3. Esophageal stent again noted. 4. Probable moderate LEFT hydronephrosis. Consider further evaluation with ultrasound or CT as clinically indicated. 5. Coronary artery disease andaortic  Atherosclerosis (ICD10-I70.0). Electronically Signed   By: Margarette Canada M.D.   On: 06/02/2020 14:41   CT ABDOMEN PELVIS W CONTRAST  Result Date: 06/22/2020 CLINICAL DATA:  Nausea and vomiting, vomited tube feedings EXAM:  CT ABDOMEN AND PELVIS WITH CONTRAST TECHNIQUE: Multidetector CT imaging of the abdomen and pelvis was performed using the standard protocol following bolus administration of intravenous contrast. CONTRAST:  120mL OMNIPAQUE IOHEXOL 300 MG/ML  SOLN COMPARISON:  06/19/2020, 05/13/2020 FINDINGS: Lower chest: Persistent complex loculated right pleural fluid collection, suspicious for empyema/abscess given history of esophageal perforation. Trace free-flowing right pleural fluid. Significant consolidation of the right lower lobe consistent with atelectasis. Esophageal stent identified, with fluid and debris within the esophageal lumen. Hepatobiliary: No focal liver abnormality is seen. No gallstones, gallbladder wall thickening, or biliary dilatation. Pancreas: Unremarkable. No pancreatic ductal dilatation or surrounding inflammatory changes. Spleen: Normal in size without focal abnormality. Adrenals/Urinary Tract: Persistent left-sided hydronephrosis related to an obstructing 11 mm proximal left ureteral calculus. Multiple punctate nonobstructing left renal calculi again identified unchanged. Punctate nonobstructing right renal calculi are identified. Right kidney enhances normally. No right-sided obstruction. Bladder is unremarkable.  The adrenals are normal. Stomach/Bowel: Percutaneous gastrostomy tube is identified. No bowel obstruction or ileus. No bowel wall thickening or inflammatory change. Vascular/Lymphatic: Aortic atherosclerosis. No enlarged abdominal or pelvic lymph nodes. Reproductive: Prostate is unremarkable. Other: No free fluid or free intraperitoneal gas. No abdominal wall hernia. Musculoskeletal: No acute or destructive bony lesions. Reconstructed images demonstrate no additional  findings. IMPRESSION: 1. Persistent complex loculated right pleural fluid collection, concerning for abscess. Persistent esophageal perforation cannot be excluded in light of previous findings. 2. Stable esophageal stent, with debris and fluid in the esophageal lumen as before. 3. Persistent left-sided hydronephrosis related to an 11 mm obstructing proximal left ureteral calculus. 4. Other bilateral nonobstructing renal calculi 5.  Aortic Atherosclerosis (ICD10-I70.0). Electronically Signed   By: Randa Ngo M.D.   On: 06/22/2020 19:38   DG CHEST PORT 1 VIEW  Result Date: 06/18/2020 CLINICAL DATA:  Rhonchi EXAM: PORTABLE CHEST 1 VIEW COMPARISON:  Eight days ago FINDINGS: Increased hazy density at the right base where there is a pleural effusion and atelectasis by recent CT. Stable positioning of the esophageal stent. Normal heart size and aortic contours. No visible pneumothorax. IMPRESSION: Right pleural effusion and lower lobe collapse with mildly increased opacity since 06/10/2020. Electronically Signed   By: Monte Fantasia M.D.   On: 06/18/2020 09:47   DG CHEST PORT 1 VIEW  Result Date: 06/04/2020 CLINICAL DATA:  Pneumonia EXAM: PORTABLE CHEST 1 VIEW COMPARISON:  Chest radiograph and chest CT June 02, 2020 FINDINGS: Esophageal stent again noted, unchanged in position. Chest tube present on the right. No evident pneumothorax. There is a small right pleural effusion with airspace opacity in the right mid and lower lung regions. Left lung is clear. Heart is borderline enlarged with pulmonary vascularity normal. No adenopathy. No bone lesions. IMPRESSION: Persistent small right pleural effusion with airspace opacity, likely combination of atelectasis and pneumonia, in the right mid and lower lung regions. Left lung clear. Stable cardiac silhouette. Stable positioning of esophageal stent. Right chest tube present without evident pneumothorax. Electronically Signed   By: Lowella Grip III M.D.   On:  06/04/2020 08:13   DG CHEST PORT 1 VIEW  Result Date: 06/02/2020 CLINICAL DATA:  Tachypnea EXAM: PORTABLE CHEST 1 VIEW COMPARISON:  June 01, 2020 FINDINGS: An esophageal stent is again identified. Stable right chest tube. No pneumothorax. Opacity and effusion remain in the right base. No other interval changes. IMPRESSION: 1. Small right effusion underlying opacity. The opacity could represent atelectasis or infiltrate. No other changes. Electronically Signed   By: Dorise Bullion III M.D  On: 06/02/2020 12:41   DG CHEST PORT 1 VIEW  Result Date: 06/01/2020 CLINICAL DATA:  Chest tube. Shortness of breath. EXAM: PORTABLE CHEST 1 VIEW COMPARISON:  May 29, 2020 FINDINGS: Since the prior study the right apical chest tube seen on the prior study has been removed. The additional right-sided chest tube seen on the prior exam is stable in position. Persistent moderate to marked severity right basilar atelectasis and/or infiltrate is noted. There is a small right pleural effusion. A small amount of right apical pleural fluid is also suspected. No residual pneumothorax is identified. The cardiac silhouette is mildly enlarged. A radiopaque stent is seen overlying the mid and distal esophagus. The visualized skeletal structures are unremarkable. IMPRESSION: 1. Interval right apical chest tube removal without evidence of a residual pneumothorax. 2. Persistent moderate to marked severity right basilar atelectasis and/or infiltrate. 3. Small right pleural effusion. Electronically Signed   By: Virgina Norfolk M.D.   On: 06/01/2020 16:11   DG Chest Port 1 View  Result Date: 05/29/2020 CLINICAL DATA:  Chest tube.  Shortness of breath. EXAM: PORTABLE CHEST 1 VIEW COMPARISON:  05/28/2020. FINDINGS: Two right chest tubes in stable position. Esophageal stent in stable position. Stable cardiomegaly. Progressive dense right base atelectasis/infiltrate. Persistent right mid lung field infiltrate. Small right pleural  effusion, improved from prior exam. No pneumothorax. IMPRESSION: 1. Two right chest tubes in stable position. Esophageal stent stable position. No pneumothorax. 2.  Stable cardiomegaly. 3. Progressive dense right base atelectasis/infiltrate. Persistent right mid lung field infiltrate. 3.  Small right pleural effusion, improved from prior exam. Electronically Signed   By: Marcello Moores  Register   On: 05/29/2020 07:38   DG Chest Port 1 View  Result Date: 05/28/2020 CLINICAL DATA:  Pleural effusion, chest tube EXAM: PORTABLE CHEST 1 VIEW COMPARISON:  05/27/2020 FINDINGS: There are two right-sided chest tubes. Esophageal stent is again noted. Improved aeration of the right lung with persistent but decreased right pleural effusion. No pneumothorax. Cardiomediastinal contours are partially obscured IMPRESSION: Improved aeration of right lung since 05/27/2020 with persistent but decreased right pleural effusion. Electronically Signed   By: Macy Mis M.D.   On: 05/28/2020 09:53   DG Chest Port 1 View  Result Date: 05/27/2020 CLINICAL DATA:  Shortness of breath EXAM: PORTABLE CHEST 1 VIEW COMPARISON:  05/26/2020 FINDINGS: Cardiac shadow is stable. Esophageal stent is again noted and stable. Two right-sided chest tubes are seen in satisfactory position. The overall appearance is stable. Persistent opacification of the right hemithorax is again noted similar to that seen on recent plain film evaluation. No new focal abnormality is noted. IMPRESSION: Stable appearance of the chest with consolidation in the right hemithorax. Electronically Signed   By: Inez Catalina M.D.   On: 05/27/2020 07:22   DG Chest Port 1 View  Result Date: 05/25/2020 CLINICAL DATA:  Leak EXAM: PORTABLE CHEST 1 VIEW COMPARISON:  May 23, 2020 FINDINGS: The cardiomediastinal silhouette is unchanged in contour.RIGHT-sided chest tubes. Moderate RIGHT pleural effusion. Esophageal stent projects over the mediastinum inferiorly. No pneumothorax.  Persistent RIGHT-sided heterogeneous opacities are not significantly changed in comparison to prior. Minimal opacities of the LEFT lung. No acute osseous abnormality. IMPRESSION: Esophageal stent projects over the inferior mediastinum. Electronically Signed   By: Valentino Saxon MD   On: 05/25/2020 11:57   DG C-Arm 1-60 Min  Result Date: 05/25/2020 CLINICAL DATA:  Esophageal stent placement. EXAM: DG C-ARM 1-60 MIN; CHEST  1 VIEW FLUOROSCOPY TIME:  Fluoro time: 4 Minutes 17 secondsRadiation  exposure index: MGy: 41.69Number of acquired spot images: Five spot fluoro graphic images. COMPARISON:  May 17, 2020.  May 23, 2020 FINDINGS: Spot fluoroscopy images were obtained for surgical planning purposes. Esophageal stent is seen traversing the thorax. Enteric contrast traverses the stent and enters the proximal stomach. None of the provided radiographs demonstrate evaluation of the site of previous leak into the RIGHT pleural space. Selective LEFT mainstem intubation. IMPRESSION: Spot fluoroscopy images from surgical planning purposes. Please reference procedure report for further details. Electronically Signed   By: Valentino Saxon MD   On: 05/25/2020 10:59   DG ESOPHAGUS W SINGLE CM (SOL OR THIN BA)  Result Date: 05/28/2020 CLINICAL DATA:  Esophageal stent with prior leak, reassessment for leak EXAM: ESOPHOGRAM/BARIUM SWALLOW TECHNIQUE: Single contrast examination was performed using 25 cc Omnipaque 300. FLUOROSCOPY TIME:  Fluoroscopy Time:  3 minutes, 24 seconds Radiation Exposure Index (if provided by the fluoroscopic device): 33 mGy Number of Acquired Spot Images: 0 COMPARISON:  Multiple exams, including 05/23/2020 FINDINGS: Mid to distal expandable esophageal stent noted. The prior right-sided leak from the distal margin of the stent is no longer present. Contrast medium extending around the distal margin of the stent primarily on the left side appears to be intraluminal and likely in between the  stent and the wall of the distal esophagus or small hiatal hernia based on the contained appearance for example on image 26/8. This collection was noted to extend proximally along the distal half of the stent along the left side. This is not unexpected given the appearance on the chest CT of 05/24/2020, which showed some luminal gas tracking along the left side of the stent in this vicinity. Accordingly, no leak from the esophagus is currently identified. IMPRESSION: 1. No esophageal leak is currently identified. 2. A small amount of intraluminal contrast extends around the stent distally, but this is felt to be due to incomplete apposition of the stent with the lumen, rather than a leak. Electronically Signed   By: Van Clines M.D.   On: 05/28/2020 08:29    Labs:  CBC: Recent Labs    06/18/20 1209 06/20/20 0801 06/22/20 1444 06/23/20 0700  WBC 11.0* 10.4 14.5* 13.4*  HGB 9.4* 9.1* 9.5* 8.4*  HCT 31.7* 30.8* 32.2* 28.7*  PLT 405* 434* 466* 421*    COAGS: Recent Labs    05/10/20 2111 05/11/20 0536 06/02/20 1240 06/02/20 1524  INR 1.1 1.1 1.2 1.2  APTT 30 31  --  35    BMP: Recent Labs    06/19/20 0146 06/20/20 0801 06/22/20 1444 06/23/20 0700  NA 133* 133* 133* 131*  K 4.7 4.1 4.6 4.3  CL 97* 97* 94* 96*  CO2 29 26 30 25   GLUCOSE 132* 157* 141* 143*  BUN 12 11 12 11   CALCIUM 8.9 8.8* 8.9 8.4*  CREATININE 0.68 0.65 0.85 0.84  GFRNONAA >60 >60 >60 >60  GFRAA >60 >60 >60 >60    LIVER FUNCTION TESTS: Recent Labs    06/14/20 0124 06/17/20 0951 06/20/20 0801 06/23/20 0700  BILITOT 0.4 0.5 0.4 0.4  AST 52* 55* 61* 45*  ALT 132* 154* 198* 158*  ALKPHOS 148* 142* 145* 144*  PROT 7.7 7.5 7.4 7.3  ALBUMIN 2.3* 2.1* 2.2* 2.0*    TUMOR MARKERS: No results for input(s): AFPTM, CEA, CA199, CHROMGRNA in the last 8760 hours.  Assessment and Plan: Chronic rim enhancing right basilar pleural effusion. Concern for infection given low grade fevers and  leukocytosis. Imaging reviewed.  Plan for CT guided perc chest tube placement tomorrow. Labs reviewed. Pt on Eliquis for A. Fib. Mildly elevated risk for procedural bleeding Risks and benefits discussed with the patient including bleeding, infection, damage to adjacent structures, bowel perforation/fistula connection, and sepsis.  All of the patient's questions were answered, patient is agreeable to proceed. Consent signed and in chart.    Thank you for this interesting consult.  I greatly enjoyed meeting [facility] and look forward to participating in their care.  A copy of this report was sent to the requesting provider on this date.  Electronically Signed: Ascencion Dike, PA-C 06/24/2020, 9:58 AM   I spent a total of 20 minutes in face to face in clinical consultation, greater than 50% of which was counseling/coordinating care for (R)chest tube

## 2020-06-25 ENCOUNTER — Inpatient Hospital Stay (HOSPITAL_COMMUNITY): Payer: Medicaid Other

## 2020-06-25 LAB — GLUCOSE, CAPILLARY
Glucose-Capillary: 117 mg/dL — ABNORMAL HIGH (ref 70–99)
Glucose-Capillary: 127 mg/dL — ABNORMAL HIGH (ref 70–99)
Glucose-Capillary: 128 mg/dL — ABNORMAL HIGH (ref 70–99)
Glucose-Capillary: 133 mg/dL — ABNORMAL HIGH (ref 70–99)

## 2020-06-25 LAB — CBC WITH DIFFERENTIAL/PLATELET
Abs Immature Granulocytes: 0.06 10*3/uL (ref 0.00–0.07)
Basophils Absolute: 0 10*3/uL (ref 0.0–0.1)
Basophils Relative: 0 %
Eosinophils Absolute: 0.2 10*3/uL (ref 0.0–0.5)
Eosinophils Relative: 2 %
HCT: 29.4 % — ABNORMAL LOW (ref 39.0–52.0)
Hemoglobin: 8.6 g/dL — ABNORMAL LOW (ref 13.0–17.0)
Immature Granulocytes: 1 %
Lymphocytes Relative: 14 %
Lymphs Abs: 1.5 10*3/uL (ref 0.7–4.0)
MCH: 23.7 pg — ABNORMAL LOW (ref 26.0–34.0)
MCHC: 29.3 g/dL — ABNORMAL LOW (ref 30.0–36.0)
MCV: 81 fL (ref 80.0–100.0)
Monocytes Absolute: 0.8 10*3/uL (ref 0.1–1.0)
Monocytes Relative: 8 %
Neutro Abs: 7.8 10*3/uL — ABNORMAL HIGH (ref 1.7–7.7)
Neutrophils Relative %: 75 %
Platelets: 463 10*3/uL — ABNORMAL HIGH (ref 150–400)
RBC: 3.63 MIL/uL — ABNORMAL LOW (ref 4.22–5.81)
RDW: 16 % — ABNORMAL HIGH (ref 11.5–15.5)
WBC: 10.3 10*3/uL (ref 4.0–10.5)
nRBC: 0 % (ref 0.0–0.2)

## 2020-06-25 LAB — BASIC METABOLIC PANEL
Anion gap: 10 (ref 5–15)
BUN: 9 mg/dL (ref 6–20)
CO2: 24 mmol/L (ref 22–32)
Calcium: 8.8 mg/dL — ABNORMAL LOW (ref 8.9–10.3)
Chloride: 99 mmol/L (ref 98–111)
Creatinine, Ser: 0.69 mg/dL (ref 0.61–1.24)
GFR calc Af Amer: >60 mL/min (ref >60)
GFR calc non Af Amer: >60 mL/min (ref >60)
Glucose, Bld: 107 mg/dL — ABNORMAL HIGH (ref 70–99)
Potassium: 4.3 mmol/L (ref 3.5–5.1)
Sodium: 133 mmol/L — ABNORMAL LOW (ref 135–145)

## 2020-06-25 MED ORDER — LIDOCAINE HCL 1 % IJ SOLN
INTRAMUSCULAR | Status: AC
  Filled 2020-06-25: qty 20

## 2020-06-25 MED ORDER — FENTANYL CITRATE (PF) 100 MCG/2ML IJ SOLN
INTRAMUSCULAR | Status: AC | PRN
  Administered 2020-06-25: 25 ug via INTRAVENOUS

## 2020-06-25 MED ORDER — FLUCONAZOLE 200 MG PO TABS
200.0000 mg | ORAL_TABLET | Freq: Once | ORAL | Status: AC
  Administered 2020-06-25: 200 mg via ORAL
  Filled 2020-06-25: qty 1

## 2020-06-25 MED ORDER — FENTANYL CITRATE (PF) 100 MCG/2ML IJ SOLN
INTRAMUSCULAR | Status: AC
  Filled 2020-06-25: qty 2

## 2020-06-25 MED ORDER — FLUCONAZOLE IN SODIUM CHLORIDE 400-0.9 MG/200ML-% IV SOLN
400.0000 mg | INTRAVENOUS | Status: AC
  Administered 2020-06-26 – 2020-07-08 (×12): 400 mg via INTRAVENOUS
  Filled 2020-06-25 (×13): qty 200

## 2020-06-25 MED ORDER — SODIUM CHLORIDE 0.9 % IV SOLN
1.0000 g | Freq: Three times a day (TID) | INTRAVENOUS | Status: AC
  Administered 2020-06-25 – 2020-07-08 (×40): 1 g via INTRAVENOUS
  Filled 2020-06-25 (×40): qty 1

## 2020-06-25 MED ORDER — MIDAZOLAM HCL 2 MG/2ML IJ SOLN
INTRAMUSCULAR | Status: AC
  Filled 2020-06-25: qty 2

## 2020-06-25 MED ORDER — FLUCONAZOLE IN SODIUM CHLORIDE 200-0.9 MG/100ML-% IV SOLN
200.0000 mg | Freq: Once | INTRAVENOUS | Status: AC
  Administered 2020-06-25: 200 mg via INTRAVENOUS
  Filled 2020-06-25: qty 100

## 2020-06-25 MED ORDER — MIDAZOLAM HCL 2 MG/2ML IJ SOLN
INTRAMUSCULAR | Status: AC | PRN
  Administered 2020-06-25: 0.5 mg via INTRAVENOUS

## 2020-06-25 MED ORDER — NYSTATIN 100000 UNIT/ML MT SUSP
5.0000 mL | Freq: Four times a day (QID) | OROMUCOSAL | Status: DC
  Administered 2020-06-25 – 2020-06-26 (×6): 500000 [IU] via ORAL
  Filled 2020-06-25 (×10): qty 5

## 2020-06-25 MED ORDER — VANCOMYCIN HCL 500 MG/100ML IV SOLN
500.0000 mg | Freq: Two times a day (BID) | INTRAVENOUS | Status: DC
  Administered 2020-06-26 – 2020-06-28 (×6): 500 mg via INTRAVENOUS
  Filled 2020-06-25 (×6): qty 100

## 2020-06-25 MED ORDER — VANCOMYCIN HCL 1250 MG/250ML IV SOLN
1250.0000 mg | Freq: Once | INTRAVENOUS | Status: AC
  Administered 2020-06-25: 1250 mg via INTRAVENOUS
  Filled 2020-06-25: qty 250

## 2020-06-25 NOTE — Progress Notes (Addendum)
Pharmacy Antibiotic Note  Evan Chavez is a 59 y.o. male admitted on 05/10/2020 with lung abscess.  Pharmacy has been consulted for vancomycin dosing.  WBC 10.3, afebrile. Scr 0.69 (CrCl 83 mL/min). Underwent R chest tube placement today per IR. On day #4 of cefepime since restart on 9/10.  Plan: Vancomycin 1250 mg IV once then 500 mg every 12 hours  Meropenem 1g IV every 8 hours Fluconazole 400 mg IV every 24 hours  Monitor renal fx, cx results, clinical pic, and ID recommendations  Height: 5\' 9"  (175.3 cm) Weight: 59.8 kg (131 lb 13.4 oz) IBW/kg (Calculated) : 70.7  Temp (24hrs), Avg:98.5 F (36.9 C), Min:97.8 F (36.6 C), Max:99 F (37.2 C)  Recent Labs  Lab 06/19/20 0146 06/20/20 0801 06/22/20 1444 06/23/20 0700 06/25/20 0545  WBC  --  10.4 14.5* 13.4* 10.3  CREATININE 0.68 0.65 0.85 0.84 0.69    Estimated Creatinine Clearance: 83.1 mL/min (by C-G formula based on SCr of 0.69 mg/dL).    No Known Allergies  Antimicrobials this admission: Vanc 8/12 >>8/18; 8/21>>8/24, 9/13 >> Zosyn 8/3 >>8/18 8/21 Ceftriaxone 7/30>>8/2 Azithromycin 7/30>>8/2 Fluconazole 8/5>>8/24, 9/13>>  Cefepime 8/24> 8/27, 9/10> 9/13 Meropenem 9/13>>   Dose adjustments this admission: N/A  Microbiology results: 7/29 BCx - negative 7/30 UCx - negative 8/2 pleural fluid - negative 8/4 MRSA PCR - negative 8/5 right chest empyema tissue - candida parap 8/13 empyema: yeast - candida parap - sens to flucon -per ID  8/20 blood x2- neg 8/21 blood x2- neg 8/29 blood>> neg 9/7 bld: ngtd 9/10 bld: ngtd  Thank you for allowing pharmacy to be a part of this patient's care.  Antonietta Jewel, PharmD, [place] Clinical Pharmacist  Phone: [phone] 06/25/2020 12:42 PM  Please check AMION for all [place] phone numbers After 10:00 PM, call [place] [phone]

## 2020-06-25 NOTE — Consult Note (Signed)
Evan Chavez for Infectious Disease    Date of Admission:  05/10/2020           Day 4 cefepime        Day 1 vancomycin       Reason for Consult: Persistent pleural empyema following esophageal perforation    Referring Provider: Dr. Hosie Poisson  Assessment: He has persistent right pleural empyema following recent esophageal perforation.  His initial cultures were almost certainly impacted by prior antibiotic therapy.  Esophageal perforation often leads to a mixed aerobic anaerobic and in this case fungal infection.  His MRSA PCR screen is now positive.  I will continue vancomycin but change cefepime to meropenem and restart fluconazole.  Plan: 1. Continue vancomycin 2. Change cefepime to meropenem 3. Restart fluconazole 4. Await results of pleural fluid Gram stain and culture  Principal Problem:   Pleural empyema (HCC) Active Problems:   Pneumothorax on right   Esophageal perforation   Essential hypertension   Atrial tachycardia (HCC)   Dyslipidemia   Transaminitis   Gastroesophageal reflux disease with esophagitis   Barrett's esophagus   Thyroid nodule   BPH (benign prostatic hyperplasia)   Ureteral stone with hydronephrosis   Acute respiratory failure with hypoxia (HCC)   Malnutrition of moderate degree   Pressure injury of skin   Atrial fibrillation with RVR (HCC)   Right lower lobe pulmonary infiltrate   Dysphagia   Status post thoracentesis   Scheduled Meds: . apixaban  5 mg Oral BID  . Chlorhexidine Gluconate Cloth  6 each Topical Q0600  . feeding supplement (PROSource TF)  45 mL Per Tube BID  . fentaNYL      . FLUoxetine  20 mg Per Tube Daily  . insulin aspart  0-24 Units Subcutaneous TID AC & HS  . lidocaine      . mouth rinse  15 mL Mouth Rinse q12n4p  . metoprolol tartrate  100 mg Per Tube BID  . midazolam      . mupirocin ointment  1 application Nasal BID  . nystatin  5 mL Oral QID  . pantoprazole sodium  40 mg Per Tube BID  .  sennosides  5 mL Per Tube QHS  . traZODone  50 mg Per Tube QHS   Continuous Infusions: . sodium chloride 1,000 mL (06/06/20 0028)  . sodium chloride    . ceFEPime (MAXIPIME) IV 2 g (06/25/20 1136)  . feeding supplement (OSMOLITE 1.5 CAL) 1,000 mL (06/23/20 1731)  . vancomycin 1,250 mg (06/25/20 1315)  . [START ON 06/26/2020] vancomycin     PRN Meds:.sodium chloride, [CANCELED] Place/Maintain arterial line **AND** sodium chloride, acetaminophen (TYLENOL) oral liquid 160 mg/5 mL, fentaNYL (SUBLIMAZE) injection, guaiFENesin-dextromethorphan, hydrALAZINE, ipratropium-albuterol, metoprolol tartrate, ondansetron (ZOFRAN) IV, oxyCODONE, phenol, traMADol  HPI: Evan Chavez is a 59 y.o. male with a history of GERD, esophageal stricture and Barrett's esophagus.  He was seen in the emergency department on 05/09/2020 for fever.  His chest x-ray was read as pneumonia and he was discharged on amoxicillin and doxycycline.  He returned the following day because of continued weakness, productive cough and shortness of breath leading to admission.  He was found to have esophageal perforation with right-sided effusion.  He was started on broad antibiotic therapy.  Thoracentesis was performed on 05/14/2020.  No organisms were seen on Gram stain and cultures were negative.  He underwent VATS drainage on 05/17/2020.  Cultures grew Candida parapsilosis.  He was continued  on broad empiric antibacterial therapy and received fluconazole for 19 days.  He had an esophageal stent placement.  Repeat study on 05/28/2020 did not show any persistent esophageal leak.  Antimicrobial therapy was discontinued on 06/08/2020.    Repeat CT on 06/19/2020 showed interval increase in his right pleural effusion.  He had renewed fever to 101 degrees on 06/21/2020.  He was started back on cefepime.  He underwent right chest tube placement this morning with findings suggestive of empyema or abscess.  Gram stain is pending.  Vancomycin was added  today.   Review of Systems: Review of Systems  Constitutional: Negative for fever.  Respiratory: Positive for sputum production and shortness of breath. Negative for cough.   Cardiovascular: Positive for chest pain.  Gastrointestinal: Negative for abdominal pain, diarrhea, nausea and vomiting.    Past Medical History:  Diagnosis Date  . Barrett's esophagus   . Chronic anemia   . Cocaine abuse ([place])   . Duodenal ulcer   . Esophageal stricture   . ETOH abuse   . GERD (gastroesophageal reflux disease)   . Hyperlipidemia   . Hypertension   . Peptic ulcer   . Pneumonia   . 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 Smoking status: Former Smoker    Packs/day: 1.00    Years: 40.00    Pack years: 40.00    Types: Cigarettes    Quit date: 06/2018    Years since quitting: 2.0  . Smokeless tobacco: Never Used  Vaping Use  . Vaping Use: Never used  Substance Use Topics  . Alcohol use: Not Currently    Alcohol/week: 12.0 standard drinks    Types: 12 Cans of beer per week    Comment: none since 06/2018  . Drug use: Not Currently    Types: Cocaine    Comment: last cocaine use 07/12/18    Family History  Problem Relation Age of Onset  . Hypertension Father   . Heart attack Father   . Heart disease Father   . Hypertension Mother   . GER disease Mother   . Thyroid disease Mother   . Colon cancer Neg Hx   . Colon polyps Neg Hx    No Known Allergies  OBJECTIVE: Blood pressure (!) 131/93, pulse (!) 119, temperature 97.8 F (36.6 C), temperature source Oral, resp. rate (!) 22, height 5\' 9"  (1.753 m), weight 59.8 kg, SpO2 93 %.  Physical Exam Constitutional:      Comments: He is very weak.  He answers questions by nodding yes or no.  Cardiovascular:     Rate and Rhythm: Regular rhythm.     Heart sounds: No murmur heard.   Pulmonary:     Effort: Pulmonary effort is normal.     Comments: Diminished breath sounds on the  right. Abdominal:     Palpations: Abdomen is soft.     Tenderness: There is no abdominal tenderness.     Lab Results Lab Results  Component Value Date   WBC 10.3 06/25/2020   HGB 8.6 (L) 06/25/2020   HCT 29.4 (L) 06/25/2020   MCV 81.0 06/25/2020   PLT 463 (H) 06/25/2020    Lab Results  Component Value Date   CREATININE 0.69 06/25/2020   BUN 9 06/25/2020   NA 133 (L) 06/25/2020   K 4.3 06/25/2020   CL 99 06/25/2020   CO2 24 06/25/2020    Lab Results  Component  Value Date   ALT 158 (H) 06/23/2020   AST 45 (H) 06/23/2020   ALKPHOS 144 (H) 06/23/2020   BILITOT 0.4 06/23/2020     Microbiology: Recent Results (from the past 240 hour(s))  Culture, blood (routine x 2)     Status: None   Collection Time: 06/19/20  2:30 PM   Specimen: BLOOD LEFT ARM  Result Value Ref Range Status   Specimen Description BLOOD LEFT ARM  Final   Special Requests   Final    BOTTLES DRAWN AEROBIC AND ANAEROBIC Blood Culture adequate volume   Culture   Final    NO GROWTH 5 DAYS Performed at [facility], [place] [address], [place], [place] [address]    Report Status 06/24/2020 FINAL  Final  Culture, blood (routine x 2)     Status: None   Collection Time: 06/19/20  2:35 PM   Specimen: BLOOD RIGHT HAND  Result Value Ref Range Status   Specimen Description BLOOD RIGHT HAND  Final   Special Requests   Final    BOTTLES DRAWN AEROBIC AND ANAEROBIC Blood Culture adequate volume   Culture   Final    NO GROWTH 5 DAYS Performed at [facility], [place] [address], [place], [place] [address]    Report Status 06/24/2020 FINAL  Final  SARS Coronavirus 2 by RT PCR (hospital order, performed in [facility] hospital lab) Nasopharyngeal Nasopharyngeal Swab     Status: None   Collection Time: 06/21/20  4:20 PM   Specimen: Nasopharyngeal Swab  Result Value Ref Range Status   SARS Coronavirus 2 NEGATIVE NEGATIVE Final    Comment: (NOTE) SARS-CoV-2 target nucleic acids are NOT DETECTED.  The  SARS-CoV-2 RNA is generally detectable in upper and lower respiratory specimens during the acute phase of infection. The lowest concentration of SARS-CoV-2 viral copies this assay can detect is 250 copies / mL. A negative result does not preclude SARS-CoV-2 infection and should not be used as the sole basis for treatment or other patient management decisions.  A negative result may occur with improper specimen collection / handling, submission of specimen other than nasopharyngeal swab, presence of viral mutation(s) within the areas targeted by this assay, and inadequate number of viral copies (<250 copies / mL). A negative result must be combined with clinical observations, patient history, and epidemiological information.  Fact Sheet for Patients:   StrictlyIdeas.no  Fact Sheet for Healthcare Providers: BankingDealers.co.za  This test is not yet approved or  cleared by the [place] FDA and has been authorized for detection and/or diagnosis of SARS-CoV-2 by FDA under an Emergency Use Authorization (EUA).  This EUA will remain in effect (meaning this test can be used) for the duration of the COVID-19 declaration under Section 564(b)(1) of the Act, 21 U.S.C. section 360bbb-3(b)(1), unless the authorization is terminated or revoked sooner.  Performed at [facility], [place] [address] [place], [place] [address]   Culture, blood (Routine X 2) w Reflex to ID Panel     Status: None (Preliminary result)   Collection Time: 06/22/20  9:54 AM   Specimen: BLOOD RIGHT HAND  Result Value Ref Range Status   Specimen Description BLOOD RIGHT HAND  Final   Special Requests   Final    BOTTLES DRAWN AEROBIC ONLY Blood Culture adequate volume   Culture   Final    NO GROWTH 2 DAYS Performed at [facility], [place] [address] [place], [place] [address]  Report Status PENDING  Incomplete  Culture, blood (Routine X 2) w Reflex to ID Panel      Status: None (Preliminary result)   Collection Time: 06/22/20 10:01 AM   Specimen: BLOOD LEFT ARM  Result Value Ref Range Status   Specimen Description BLOOD LEFT ARM  Final   Special Requests   Final    BOTTLES DRAWN AEROBIC AND ANAEROBIC Blood Culture adequate volume   Culture   Final    NO GROWTH 2 DAYS Performed at [facility], [address], [place], [place] [address]    Report Status PENDING  Incomplete  MRSA PCR Screening     Status: Abnormal   Collection Time: 06/23/20 12:30 PM   Specimen: Nasopharyngeal  Result Value Ref Range Status   MRSA by PCR POSITIVE (A) NEGATIVE Final    Comment:        The GeneXpert MRSA Assay (FDA approved for NASAL specimens only), is one component of a comprehensive MRSA colonization surveillance program. It is not intended to diagnose MRSA infection nor to guide or monitor treatment for MRSA infections. RESULT CALLED TO, READ BACK BY AND VERIFIED WITH: L. LEE RN, AT 8022 06/23/20 [place] Performed at [facility], [place] [address] [place], [place] [address]     Michel Bickers, [place] [facility] [place] [facility] [phone] pager   [phone] cell 06/25/2020, 2:13 PM

## 2020-06-25 NOTE — Progress Notes (Signed)
PROGRESS NOTE    Evan Chavez  KDT:[id number] DOB: 02-19-1961 DOA: 05/10/2020 PCP: Rosita Fire, MD    Chief Complaint  Patient presents with  . Shortness of Breath    Brief Narrative:  59 year old gentleman with prior history of GERD, Barrett's esophagus, peptic ulcer disease with extensive gastric erosion, duodenal ulcer and esophageal stricture s/p balloon dilatation on 3/2 0 20, CVA, hyperlipidemia, BPH, remote cocaine abuse presents with weakness productive cough, fever and dyspnea was found to have community-acquired pneumonia.Hospital course complicated by right sided hydropneumothorax with possible lung collapse s/p thoracentesis on the right on 05/14/2020, esophageal perforation with right sided hydropneumothorax, complicated by aspiration pneumonia. Patient underwent right sided chest tube placement, transferred from [facility] to [facility] for VATS and decortication by CT surgery as well as esophageal stent placement and PEG placement on 05/16/20. Patient had episodes of persistent leakage from esophagus on esophagogram as well as underwent drainage of loculated pleural effusion on 05/25/20 by CT surgery. Repeat esophagogram on 05/28/2020 did not show any esophageal leak. CT chest with contrast on 9/7 showed Interval increase in a loculated right posteromedial pleural fluid collection with surrounding enhancing pleura, which is sterility indeterminate by imaging but possibly an empyema. CT surgery re consulted, suggested that residual entrapped lung and poor pulmonary toilet. Over the last 48 hours pt had low grade temp with elevated wbc count, and CT abd and pelvis shows loculated complex pleural effusion , possibly an abscess. TCTS on board and he is scheduled for chest tube placement by IR today.  Pt seen and examined at bedside.     Assessment & Plan:   Active Problems:   Essential hypertension   Atrial tachycardia (HCC)   Dyslipidemia   CAP (community acquired pneumonia)    Transaminitis   Lobar pneumonia ([place])   Sepsis due to undetermined organism ([place])   Fever   Gastroesophageal reflux disease with esophagitis   Shortness of breath   Thyroid nodule   BPH (benign prostatic hyperplasia)   Loculated pleural effusion   Ureteral stone with hydronephrosis   Pneumothorax on right   Acute respiratory failure with hypoxia (HCC)   Chronic bilateral pleural effusions   Parapneumonic effusion   Malnutrition of moderate degree   Esophageal perforation   Pressure injury of skin   Atrial fibrillation with RVR (HCC)   Esophageal anastomotic leak   Atelectasis   Right lower lobe pulmonary infiltrate   Sinus tachycardia   Dysphagia   Respiratory distress   Status post thoracentesis   Acute respiratory failure with hypoxia secondary to community-acquired pneumonia, Parapneumonic effusion. Patient underwent chest tube placement initially by cardiothoracic surgery.  Pleural fluid culture significant for Candida parapsilosis.  He completed the course of cefepime and fluconazole. He was weaned off oxygen.  Patient remains on RA with good sats, but he remains tachypneic and tachycardic.  On 9/9- 9/10 pt had fevers, wbc count, tachypnea, he was started on IV cefepime for possible lung abscess. Dr Kipp Brood with TCTS recommended CT guided chest tube by IR, scheduled today. Please send pleural fluid for gram stain, and culture. ID consulted for recommendations for antibiotics.    Hydropneumothorax s/p chest tube placement by CT surgery.  Resolved.    Sepsis present on admission:  In the setting of aspiration pneumonia, empyema and mediastinitis. Patient continues to have tachycardia and tachypnea.    Lung abscess; Fevers , leukocytosis, started him IV cefepime. CT guided chest tube placement scheduled for today. Started him on cefepime on 9/10, vancomycin added  today as his MRSA pcr is positive. ID consulted, added fluconazole to his regimen.    Esophageal rupture  s/p stent placement by CT surgery. Persistent esophageal perforation on CT abd and pelvis.     Oropharyngeal dysphagia s/p PEG placement on tube feeds.   SLP evaluation recommending clear liquid diet for now but pt is refusing to take any by mouth. He denies any odynophagia, RN is concerned of white patches on the tongue , suspect oral candidiasis , will start him on nystatin swishes.     History of paroxysmal atrial fibrillation Sinus tachycardia improved with increase in metoprolol.  CT angiogram is negative for PE. Continue with metoprolol for rate control and Eliquis for anticoagulation . Last echocardiogram showed Left ventricular ejection fraction, by estimation, is 65 to 70%. The  left ventricle has normal function. The left ventricle has no regional  wall motion abnormalities. Left ventricular diastolic parameters are consistent with Grade I diastolic  dysfunction (impaired relaxation).    Acute on chronic blood loss anemia in the setting of blood loss from esophageal rupture/postop anemia/ microcytic anemia:  in addition to hematemesis which have all resolved.   Hemoglobin stable around 8.  Repeat labs in am.    Chronic sinus tachycardia Heart rates between 90s to110/min.  Increased metoprolol to 100 mg BID.     History of stroke with residual right-sided weakness.   Patient was on Lipitor as an outpatient which was being held for elevated liver enzymes. Recheck liver enzymes in 2 weeks and restart lipitor if liver enzymes wnl.    Left hydronephrosis with ureteral stone. CT abg shows persistent left hydronephrosis.  -Outpatient follow-up with urology for shockwave lithotripsy.   Hyponatremia Sodium at 133.   Depression Continue with Prozac daily.     DVT prophylaxis: Eliquis.  Code Status: (Full code) Family Communication: discussed with mother over the phone.  Disposition:   Status is: Inpatient  Remains inpatient appropriate because:Ongoing diagnostic  testing needed not appropriate for outpatient work up, Unsafe d/c plan and Inpatient level of care appropriate due to severity of illness, CT guided chest tube placement in am. .    Dispo:  Patient From: Home  Planned Disposition: [place]  Expected discharge date:   Medically stable for discharge:   No        Consultants:   [place].    Procedures:   VATS/DECORTICATION/PEG PLACEMENT/ESOPHAGEAL STENT PLACEMENT (05/17/2020)  RIGHT THORACOSCOPY/SOPHAGOGASTROSCOPY/ESOPHAGEAL STENT PLACEMENT/ESOPHAGRAM   Antimicrobials:  Anti-infectives (From admission, onward)   Start     Dose/Rate Route Frequency Ordered Stop   06/22/20 2030  ceFEPIme (MAXIPIME) 2 g in sodium chloride 0.9 % 100 mL IVPB        2 g 200 mL/hr over 30 Minutes Intravenous Every 8 hours 06/22/20 2020     06/03/20 0200  vancomycin (VANCOCIN) IVPB 1000 mg/200 mL premix  Status:  Discontinued        1,000 mg 200 mL/hr over 60 Minutes Intravenous Every 12 hours 06/02/20 1248 06/05/20 0915   06/02/20 2200  ceFEPIme (MAXIPIME) 2 g in sodium chloride 0.9 % 100 mL IVPB        2 g 200 mL/hr over 30 Minutes Intravenous Every 8 hours 06/02/20 1251 06/08/20 2359   06/02/20 2000  piperacillin-tazobactam (ZOSYN) IVPB 3.375 g  Status:  Discontinued        3.375 g 12.5 mL/hr over 240 Minutes Intravenous Every 8 hours 06/02/20 1248 06/02/20 1249   06/02/20 1330  vancomycin (VANCOREADY)  IVPB 1250 mg/250 mL        1,250 mg 166.7 mL/hr over 90 Minutes Intravenous  Once 06/02/20 1248 06/02/20 1646   06/02/20 1330  piperacillin-tazobactam (ZOSYN) IVPB 3.375 g  Status:  Discontinued        3.375 g 100 mL/hr over 30 Minutes Intravenous  Once 06/02/20 1248 06/02/20 1249   06/02/20 1215  vancomycin (VANCOCIN) IVPB 1000 mg/200 mL premix  Status:  Discontinued        1,000 mg 200 mL/hr over 60 Minutes Intravenous  Once 06/02/20 1202 06/02/20 1251   06/02/20 1215  ceFEPIme (MAXIPIME) 2 g in sodium chloride  0.9 % 100 mL IVPB        2 g 200 mL/hr over 30 Minutes Intravenous  Once 06/02/20 1202 06/02/20 1506   05/29/20 1800  vancomycin (VANCOCIN) IVPB 1000 mg/200 mL premix  Status:  Discontinued        1,000 mg 200 mL/hr over 60 Minutes Intravenous Every 12 hours 05/29/20 1151 05/30/20 0912   05/24/20 2100  vancomycin (VANCOREADY) IVPB 750 mg/150 mL  Status:  Discontinued        750 mg 150 mL/hr over 60 Minutes Intravenous Every 12 hours 05/24/20 0813 05/29/20 1151   05/24/20 0900  vancomycin (VANCOREADY) IVPB 1500 mg/300 mL        1,500 mg 150 mL/hr over 120 Minutes Intravenous  Once 05/24/20 0813 05/24/20 1221   05/17/20 0900  fluconazole (DIFLUCAN) IVPB 400 mg  Status:  Discontinued        400 mg 100 mL/hr over 120 Minutes Intravenous Every 24 hours 05/17/20 0749 06/05/20 0915   05/17/20 0600  vancomycin (VANCOREADY) IVPB 750 mg/150 mL  Status:  Discontinued        750 mg 150 mL/hr over 60 Minutes Intravenous Every 8 hours 05/16/20 2154 05/17/20 0903   05/17/20 0400  vancomycin (VANCOREADY) IVPB 750 mg/150 mL  Status:  Discontinued        750 mg 150 mL/hr over 60 Minutes Intravenous Every 8 hours 05/16/20 1945 05/16/20 2154   05/16/20 2200  vancomycin (VANCOREADY) IVPB 1250 mg/250 mL        1,250 mg 166.7 mL/hr over 90 Minutes Intravenous STAT 05/16/20 2153 05/16/20 2348   05/16/20 2000  vancomycin (VANCOREADY) IVPB 1250 mg/250 mL  Status:  Discontinued        1,250 mg 166.7 mL/hr over 90 Minutes Intravenous  Once 05/16/20 1945 05/16/20 2153   05/15/20 1600  piperacillin-tazobactam (ZOSYN) IVPB 3.375 g  Status:  Discontinued        3.375 g 12.5 mL/hr over 240 Minutes Intravenous Every 8 hours 05/15/20 0945 05/30/20 0912   05/15/20 0930  piperacillin-tazobactam (ZOSYN) IVPB 3.375 g        3.375 g 100 mL/hr over 30 Minutes Intravenous  Once 05/15/20 0921 05/15/20 1009   05/13/20 2000  azithromycin (ZITHROMAX) tablet 500 mg        500 mg Oral Daily 05/13/20 1155 05/14/20 2127   05/10/20  2200  cefTRIAXone (ROCEPHIN) 2 g in sodium chloride 0.9 % 100 mL IVPB  Status:  Discontinued        2 g 200 mL/hr over 30 Minutes Intravenous Every 24 hours 05/10/20 2148 05/15/20 0911   05/10/20 2200  azithromycin (ZITHROMAX) 500 mg in sodium chloride 0.9 % 250 mL IVPB  Status:  Discontinued        500 mg 250 mL/hr over 60 Minutes Intravenous Every 24 hours 05/10/20 2148 05/13/20 1155  Subjective: Refusing to eat, no chest pain or sob.  Minimally conversing.   Objective: Vitals:   06/25/20 0755 06/25/20 0800 06/25/20 0900 06/25/20 1005  BP: 123/83 132/83 (!) 118/92 133/79  Pulse: (!) 109 (!) 113 (!) 110 (!) 116  Resp: (!) 34 (!) 29 (!) 30 (!) 28  Temp: 98.1 F (36.7 C)     TempSrc: Oral     SpO2: 98% 96% 97% 100%  Weight:      Height:        Intake/Output Summary (Last 24 hours) at 06/25/2020 1038 Last data filed at 06/24/2020 1600 Gross per 24 hour  Intake 980 ml  Output --  Net 980 ml   Filed Weights   06/23/20 0359 06/24/20 0300 06/25/20 0500  Weight: 59.4 kg 60 kg 59.8 kg    Examination:  General exam:Alert and comfortable, on RA.  Respiratory system: Diminished at bases, no wheezing or rhonchi. S/p chest tube , tachypnea. Cardiovascular system: S1S2, tachycardic, no JVD and no pedal edema.  Gastrointestinal system: abdomen is soft, NT ND BS+ Central nervous system: alert , able to answer yes and no Extremities: clubbing present.  Skin: no rashes seen.  Psychiatry: Mood is appropriate.     Data Reviewed: I have personally reviewed following labs and imaging studies  CBC: Recent Labs  Lab 06/18/20 1209 06/20/20 0801 06/22/20 1444 06/23/20 0700 06/25/20 0545  WBC 11.0* 10.4 14.5* 13.4* 10.3  NEUTROABS  --   --  11.9*  --  7.8*  HGB 9.4* 9.1* 9.5* 8.4* 8.6*  HCT 31.7* 30.8* 32.2* 28.7* 29.4*  MCV 82.1 81.1 82.1 79.9* 81.0  PLT 405* 434* 466* 421* 463*    Basic Metabolic Panel: Recent Labs  Lab 06/19/20 0146 06/20/20 0801 06/22/20 1444  06/23/20 0700 06/25/20 0545  NA 133* 133* 133* 131* 133*  K 4.7 4.1 4.6 4.3 4.3  CL 97* 97* 94* 96* 99  CO2 29 26 30 25 24   GLUCOSE 132* 157* 141* 143* 107*  BUN 12 11 12 11 9   CREATININE 0.68 0.65 0.85 0.84 0.69  CALCIUM 8.9 8.8* 8.9 8.4* 8.8*    GFR: Estimated Creatinine Clearance: 83.1 mL/min (by C-G formula based on SCr of 0.69 mg/dL).  Liver Function Tests: Recent Labs  Lab 06/20/20 0801 06/23/20 0700  AST 61* 45*  ALT 198* 158*  ALKPHOS 145* 144*  BILITOT 0.4 0.4  PROT 7.4 7.3  ALBUMIN 2.2* 2.0*    CBG: Recent Labs  Lab 06/24/20 0632 06/24/20 1146 06/24/20 1636 06/24/20 2132 06/25/20 0613  GLUCAP 162* 160* 135* 120* 117*     Recent Results (from the past 240 hour(s))  Culture, blood (routine x 2)     Status: None   Collection Time: 06/19/20  2:30 PM   Specimen: BLOOD LEFT ARM  Result Value Ref Range Status   Specimen Description BLOOD LEFT ARM  Final   Special Requests   Final    BOTTLES DRAWN AEROBIC AND ANAEROBIC Blood Culture adequate volume   Culture   Final    NO GROWTH 5 DAYS Performed at [facility], [place] [address], [place], [place] [address]    Report Status 06/24/2020 FINAL  Final  Culture, blood (routine x 2)     Status: None   Collection Time: 06/19/20  2:35 PM   Specimen: BLOOD RIGHT HAND  Result Value Ref Range Status   Specimen Description BLOOD RIGHT HAND  Final   Special Requests   Final    BOTTLES  DRAWN AEROBIC AND ANAEROBIC Blood Culture adequate volume   Culture   Final    NO GROWTH 5 DAYS Performed at [facility], [place] [address], [place], [place] [address]    Report Status 06/24/2020 FINAL  Final  SARS Coronavirus 2 by RT PCR (hospital order, performed in [facility] hospital lab) Nasopharyngeal Nasopharyngeal Swab     Status: None   Collection Time: 06/21/20  4:20 PM   Specimen: Nasopharyngeal Swab  Result Value Ref Range Status   SARS Coronavirus 2 NEGATIVE NEGATIVE Final    Comment: (NOTE) SARS-CoV-2  target nucleic acids are NOT DETECTED.  The SARS-CoV-2 RNA is generally detectable in upper and lower respiratory specimens during the acute phase of infection. The lowest concentration of SARS-CoV-2 viral copies this assay can detect is 250 copies / mL. A negative result does not preclude SARS-CoV-2 infection and should not be used as the sole basis for treatment or other patient management decisions.  A negative result may occur with improper specimen collection / handling, submission of specimen other than nasopharyngeal swab, presence of viral mutation(s) within the areas targeted by this assay, and inadequate number of viral copies (<250 copies / mL). A negative result must be combined with clinical observations, patient history, and epidemiological information.  Fact Sheet for Patients:   StrictlyIdeas.no  Fact Sheet for Healthcare Providers: BankingDealers.co.za  This test is not yet approved or  cleared by the [place] FDA and has been authorized for detection and/or diagnosis of SARS-CoV-2 by FDA under an Emergency Use Authorization (EUA).  This EUA will remain in effect (meaning this test can be used) for the duration of the COVID-19 declaration under Section 564(b)(1) of the Act, 21 U.S.C. section 360bbb-3(b)(1), unless the authorization is terminated or revoked sooner.  Performed at [facility], [place] [address] [place], [place] [address]   Culture, blood (Routine X 2) w Reflex to ID Panel     Status: None (Preliminary result)   Collection Time: 06/22/20  9:54 AM   Specimen: BLOOD RIGHT HAND  Result Value Ref Range Status   Specimen Description BLOOD RIGHT HAND  Final   Special Requests   Final    BOTTLES DRAWN AEROBIC ONLY Blood Culture adequate volume   Culture   Final    NO GROWTH 2 DAYS Performed at [facility], [place] [address], [place], [place] [address]    Report Status PENDING  Incomplete   Culture, blood (Routine X 2) w Reflex to ID Panel     Status: None (Preliminary result)   Collection Time: 06/22/20 10:01 AM   Specimen: BLOOD LEFT ARM  Result Value Ref Range Status   Specimen Description BLOOD LEFT ARM  Final   Special Requests   Final    BOTTLES DRAWN AEROBIC AND ANAEROBIC Blood Culture adequate volume   Culture   Final    NO GROWTH 2 DAYS Performed at [facility], [place] [address] [place],  [address]    Report Status PENDING  Incomplete  MRSA PCR Screening     Status: Abnormal   Collection Time: 06/23/20 12:30 PM   Specimen: Nasopharyngeal  Result Value Ref Range Status   MRSA by PCR POSITIVE (A) NEGATIVE Final    Comment:        The GeneXpert MRSA Assay (FDA approved for NASAL specimens only), is one component of a comprehensive MRSA colonization surveillance program. It is not intended to diagnose MRSA infection nor to guide  or monitor treatment for MRSA infections. RESULT CALLED TO, READ BACK BY AND VERIFIED WITH: L. LEE RN, AT [id number] 06/23/20 [place] Performed at [facility], [place] [address], [place], [place] [address]          Radiology Studies: No results found.      Scheduled Meds: . apixaban  5 mg Oral BID  . Chlorhexidine Gluconate Cloth  6 each Topical Q0600  . feeding supplement (PROSource TF)  45 mL Per Tube BID  . fentaNYL      . FLUoxetine  20 mg Per Tube Daily  . insulin aspart  0-24 Units Subcutaneous TID AC & HS  . lidocaine      . mouth rinse  15 mL Mouth Rinse q12n4p  . metoprolol tartrate  100 mg Per Tube BID  . midazolam      . mupirocin ointment  1 application Nasal BID  . pantoprazole sodium  40 mg Per Tube BID  . sennosides  5 mL Per Tube QHS  . traZODone  50 mg Per Tube QHS   Continuous Infusions: . sodium chloride 1,000 mL (06/06/20 0028)  . sodium chloride    . ceFEPime (MAXIPIME) IV 2 g (06/25/20 0556)  . feeding supplement (OSMOLITE 1.5 CAL) 1,000 mL (06/23/20 1731)     LOS: 45  days        Hosie Poisson, MD [facility]   To contact the attending provider between 7A-7P or the covering provider during after hours 7P-7A, please log into the web site [email or web address] and access using universal [place] password for that web site. If you do not have the password, please call the hospital operator.  06/25/2020, 10:38 AM

## 2020-06-25 NOTE — Plan of Care (Signed)

## 2020-06-25 NOTE — Plan of Care (Signed)
  Problem: Education: Goal: Knowledge of General Education information will improve Description: Including pain rating scale, medication(s)/side effects and non-pharmacologic comfort measures 06/25/2020 0842 by Don Perking, RN Outcome: Progressing 06/25/2020 0842 by Don Perking, RN Outcome: Progressing   Problem: Health Behavior/Discharge Planning: Goal: Ability to manage health-related needs will improve 06/25/2020 0842 by Don Perking, RN Outcome: Progressing 06/25/2020 0842 by Don Perking, RN Outcome: Progressing   Problem: Clinical Measurements: Goal: Ability to maintain clinical measurements within normal limits will improve 06/25/2020 0842 by Don Perking, RN Outcome: Progressing 06/25/2020 0842 by Don Perking, RN Outcome: Progressing Goal: Will remain free from infection 06/25/2020 0842 by Don Perking, RN Outcome: Progressing 06/25/2020 0842 by Don Perking, RN Outcome: Progressing Goal: Diagnostic test results will improve 06/25/2020 0842 by Don Perking, RN Outcome: Progressing 06/25/2020 0842 by Don Perking, RN Outcome: Progressing Goal: Respiratory complications will improve 06/25/2020 0842 by Don Perking, RN Outcome: Progressing 06/25/2020 0842 by Don Perking, RN Outcome: Progressing Goal: Cardiovascular complication will be avoided 06/25/2020 0842 by Don Perking, RN Outcome: Progressing 06/25/2020 0842 by Don Perking, RN Outcome: Progressing   Problem: Activity: Goal: Risk for activity intolerance will decrease 06/25/2020 0842 by Don Perking, RN Outcome: Progressing 06/25/2020 0842 by Don Perking, RN Outcome: Progressing   Problem: Nutrition: Goal: Adequate nutrition will be maintained 06/25/2020 0842 by Don Perking, RN Outcome: Progressing 06/25/2020 0842 by Don Perking, RN Outcome: Progressing   Problem: Coping: Goal: Level of anxiety will  decrease 06/25/2020 0842 by Don Perking, RN Outcome: Progressing 06/25/2020 0842 by Don Perking, RN Outcome: Progressing   Problem: Elimination: Goal: Will not experience complications related to bowel motility 06/25/2020 0842 by Don Perking, RN Outcome: Progressing 06/25/2020 0842 by Don Perking, RN Outcome: Progressing Goal: Will not experience complications related to urinary retention 06/25/2020 0842 by Don Perking, RN Outcome: Progressing 06/25/2020 0842 by Don Perking, RN Outcome: Progressing   Problem: Pain Managment: Goal: General experience of comfort will improve 06/25/2020 0842 by Don Perking, RN Outcome: Progressing 06/25/2020 0842 by Don Perking, RN Outcome: Progressing   Problem: Safety: Goal: Ability to remain free from injury will improve 06/25/2020 0842 by Don Perking, RN Outcome: Progressing 06/25/2020 0842 by Don Perking, RN Outcome: Progressing   Problem: Skin Integrity: Goal: Risk for impaired skin integrity will decrease 06/25/2020 0842 by Don Perking, RN Outcome: Progressing 06/25/2020 0842 by Don Perking, RN Outcome: Progressing

## 2020-06-25 NOTE — Procedures (Signed)
Interventional Radiology Procedure Note  Procedure: Placement of a right chest tube for infection.  Empyema vs pulmonary abscess.  56F drain placed.    Complications: None  Recommendations:  - Follow up with culture - Do not submerge - Routine chest tube care   Signed,  Dulcy Fanny. Earleen Newport, DO

## 2020-06-26 LAB — GLUCOSE, CAPILLARY
Glucose-Capillary: 153 mg/dL — ABNORMAL HIGH (ref 70–99)
Glucose-Capillary: 175 mg/dL — ABNORMAL HIGH (ref 70–99)
Glucose-Capillary: 135 mg/dL — ABNORMAL HIGH (ref 70–99)
Glucose-Capillary: 148 mg/dL — ABNORMAL HIGH (ref 70–99)

## 2020-06-26 NOTE — Progress Notes (Signed)
PROGRESS NOTE    Evan Chavez  EGB:[id number] DOB: 1961/01/25 DOA: 05/10/2020 PCP: Rosita Fire, MD    Chief Complaint  Patient presents with  . Shortness of Breath    Brief Narrative:  59 year old gentleman with prior history of GERD, Barrett's esophagus, peptic ulcer disease with extensive gastric erosion, duodenal ulcer and esophageal stricture s/p balloon dilatation on 3/2 0 20, CVA, hyperlipidemia, BPH, remote cocaine abuse presents with weakness productive cough, fever and dyspnea was found to have community-acquired pneumonia.Hospital course complicated by right sided hydropneumothorax with possible lung collapse s/p thoracentesis on the right on 05/14/2020, esophageal perforation with right sided hydropneumothorax, complicated by aspiration pneumonia. Patient underwent right sided chest tube placement, transferred from [facility] to [facility] for VATS and decortication by CT surgery as well as esophageal stent placement and PEG placement on 05/16/20. Patient had episodes of persistent leakage from esophagus on esophagogram as well as underwent drainage of loculated pleural effusion on 05/25/20 by CT surgery. Repeat esophagogram on 05/28/2020 did not show any esophageal leak. CT chest with contrast on 9/7 showed Interval increase in a loculated right posteromedial pleural fluid collection with surrounding enhancing pleura, possibly empyema. CT surgery re consulted, suggested that residual entrapped lung and poor pulmonary toilet. From 9/10  pt had low grade temp with elevated wbc count, and CT abd and pelvis shows loculated complex pleural effusion , possibly an abscess. TCTS on board and he underwent chest tube placement on the right for empyema, fluid send for gram stain and culture.  Patient seen and examined at bedside, reports he is feeling better today. No New complaints of chest pain or sob.     Assessment & Plan:   Principal Problem:   Pleural empyema (HCC) Active Problems:   Essential  hypertension   Atrial tachycardia (HCC)   Dyslipidemia   Transaminitis   Gastroesophageal reflux disease with esophagitis   Barrett's esophagus   Thyroid nodule   BPH (benign prostatic hyperplasia)   Ureteral stone with hydronephrosis   Pneumothorax on right   Acute respiratory failure with hypoxia (HCC)   Malnutrition of moderate degree   Esophageal perforation   Pressure injury of skin   Atrial fibrillation with RVR (HCC)   Right lower lobe pulmonary infiltrate   Dysphagia   Status post thoracentesis   Acute respiratory failure with hypoxia secondary to community-acquired pneumonia, Parapneumonic effusion. Patient underwent chest tube placement initially by cardiothoracic surgery.  Pleural fluid culture significant for Candida parapsilosis.  He completed the course of cefepime and fluconazole. He was weaned off oxygen.   currently patient remains on RA with good sats, but he remains tachypneic and tachycardic.  On 9/9- 9/10 pt had fevers, elevated WBC count, tachypnea, he was started on IV cefepime changed to IV Meropenam for possible lung abscess. Dr Kipp Brood with TCTS recommended CT guided chest tube by IR, done on 06/25/2020.  Repeat cultures are pending. ID consulted for antibiotics and duration.   Hydropneumothorax s/p chest tube placement by CT surgery.  Resolved.    Sepsis present on admission:  In the setting of aspiration pneumonia, empyema and mediastinitis. Sepsis physiology improved. But he continues to be tachypnic and tachycardic.    Lung abscess/empyema; Fevers , leukocytosis, on 9/10 started him IV cefepime changed to IV meropenam by ID , IV vancomycin and fluconazole added.  CT guided chest tube placement scheduled for today. ID on board and appreciate recommendations.    Esophageal rupture s/p stent placement by CT surgery. Persistent esophageal perforation on CT  abd and pelvis.     Oropharyngeal dysphagia s/p PEG placement on tube feeds.   SLP  evaluation recommending clear liquid diet for now but pt is refusing to take any by mouth. He denies any odynophagia, RN is concerned of white patches on the tongue , suspect oral candidiasis , will start him on nystatin swishes.    History of paroxysmal atrial fibrillation Sinus tachycardia improved with increase in metoprolol.  CT angiogram is negative for PE. Continue with metoprolol for rate control and Eliquis for anticoagulation . Last echocardiogram showed Left ventricular ejection fraction, by estimation, is 65 to 70%. The  left ventricle has normal function. The left ventricle has no regional  wall motion abnormalities. Left ventricular diastolic parameters are consistent with Grade I diastolic  dysfunction (impaired relaxation).    Acute on chronic blood loss anemia in the setting of blood loss from esophageal rupture/postop anemia/ microcytic anemia:  in addition to hematemesis which have all resolved.   Hemoglobin stable around 8.     Chronic sinus tachycardia Heart rates between 90s to110/min.  Increased metoprolol to 100 mg BID.     History of stroke with residual right-sided weakness.   Patient was on Lipitor as an outpatient which was being held for elevated liver enzymes. Recheck liver enzymes in 2 weeks and restart lipitor if liver enzymes wnl.    Left hydronephrosis with ureteral stone. CT abg shows persistent left hydronephrosis.  -Outpatient follow-up with urology for shockwave lithotripsy.   Hyponatremia Sodium at 133.   Depression Continue with Prozac daily.     DVT prophylaxis: Eliquis.  Code Status: (Full code) Family Communication: discussed with mother over the phone.  Disposition:   Status is: Inpatient  Remains inpatient appropriate because:Ongoing diagnostic testing needed not appropriate for outpatient work up, Unsafe d/c plan and Inpatient level of care appropriate due to severity of illness, CT guided chest tube placement in am. .     Dispo:  Patient From:    Planned Disposition: [place]  Expected discharge date:   Medically stable for discharge:   No        Consultants:   [place].    Procedures:   VATS/DECORTICATION/PEG PLACEMENT/ESOPHAGEAL STENT PLACEMENT (05/17/2020)  RIGHT THORACOSCOPY/SOPHAGOGASTROSCOPY/ESOPHAGEAL STENT PLACEMENT/ESOPHAGRAM   Antimicrobials:  Anti-infectives (From admission, onward)   Start     Dose/Rate Route Frequency Ordered Stop   06/26/20 1300  fluconazole (DIFLUCAN) IVPB 400 mg        400 mg 100 mL/hr over 120 Minutes Intravenous Every 24 hours 06/25/20 1513     06/26/20 0100  vancomycin (VANCOREADY) IVPB 500 mg/100 mL        500 mg 100 mL/hr over 60 Minutes Intravenous Every 12 hours 06/25/20 1240     06/25/20 1600  meropenem (MERREM) 1 g in sodium chloride 0.9 % 100 mL IVPB        1 g 200 mL/hr over 30 Minutes Intravenous Every 8 hours 06/25/20 1513     06/25/20 1600  fluconazole (DIFLUCAN) IVPB 200 mg        200 mg 100 mL/hr over 60 Minutes Intravenous  Once 06/25/20 1515 06/25/20 1902   06/25/20 1300  fluconazole (DIFLUCAN) tablet 200 mg        200 mg Oral  Once 06/25/20 1209 06/25/20 1308   06/25/20 1245  vancomycin (VANCOREADY) IVPB 1250 mg/250 mL        1,250 mg 166.7 mL/hr over 90 Minutes Intravenous  Once 06/25/20 1238 06/25/20 1445  06/22/20 2030  ceFEPIme (MAXIPIME) 2 g in sodium chloride 0.9 % 100 mL IVPB  Status:  Discontinued        2 g 200 mL/hr over 30 Minutes Intravenous Every 8 hours 06/22/20 2020 06/25/20 1432   06/03/20 0200  vancomycin (VANCOCIN) IVPB 1000 mg/200 mL premix  Status:  Discontinued        1,000 mg 200 mL/hr over 60 Minutes Intravenous Every 12 hours 06/02/20 1248 06/05/20 0915   06/02/20 2200  ceFEPIme (MAXIPIME) 2 g in sodium chloride 0.9 % 100 mL IVPB        2 g 200 mL/hr over 30 Minutes Intravenous Every 8 hours 06/02/20 1251 06/08/20 2359   06/02/20 2000  piperacillin-tazobactam (ZOSYN) IVPB  3.375 g  Status:  Discontinued        3.375 g 12.5 mL/hr over 240 Minutes Intravenous Every 8 hours 06/02/20 1248 06/02/20 1249   06/02/20 1330  vancomycin (VANCOREADY) IVPB 1250 mg/250 mL        1,250 mg 166.7 mL/hr over 90 Minutes Intravenous  Once 06/02/20 1248 06/02/20 1646   06/02/20 1330  piperacillin-tazobactam (ZOSYN) IVPB 3.375 g  Status:  Discontinued        3.375 g 100 mL/hr over 30 Minutes Intravenous  Once 06/02/20 1248 06/02/20 1249   06/02/20 1215  vancomycin (VANCOCIN) IVPB 1000 mg/200 mL premix  Status:  Discontinued        1,000 mg 200 mL/hr over 60 Minutes Intravenous  Once 06/02/20 1202 06/02/20 1251   06/02/20 1215  ceFEPIme (MAXIPIME) 2 g in sodium chloride 0.9 % 100 mL IVPB        2 g 200 mL/hr over 30 Minutes Intravenous  Once 06/02/20 1202 06/02/20 1506   05/29/20 1800  vancomycin (VANCOCIN) IVPB 1000 mg/200 mL premix  Status:  Discontinued        1,000 mg 200 mL/hr over 60 Minutes Intravenous Every 12 hours 05/29/20 1151 05/30/20 0912   05/24/20 2100  vancomycin (VANCOREADY) IVPB 750 mg/150 mL  Status:  Discontinued        750 mg 150 mL/hr over 60 Minutes Intravenous Every 12 hours 05/24/20 0813 05/29/20 1151   05/24/20 0900  vancomycin (VANCOREADY) IVPB 1500 mg/300 mL        1,500 mg 150 mL/hr over 120 Minutes Intravenous  Once 05/24/20 0813 05/24/20 1221   05/17/20 0900  fluconazole (DIFLUCAN) IVPB 400 mg  Status:  Discontinued        400 mg 100 mL/hr over 120 Minutes Intravenous Every 24 hours 05/17/20 0749 06/05/20 0915   05/17/20 0600  vancomycin (VANCOREADY) IVPB 750 mg/150 mL  Status:  Discontinued        750 mg 150 mL/hr over 60 Minutes Intravenous Every 8 hours 05/16/20 2154 05/17/20 0903   05/17/20 0400  vancomycin (VANCOREADY) IVPB 750 mg/150 mL  Status:  Discontinued        750 mg 150 mL/hr over 60 Minutes Intravenous Every 8 hours 05/16/20 1945 05/16/20 2154   05/16/20 2200  vancomycin (VANCOREADY) IVPB 1250 mg/250 mL        1,250 mg 166.7  mL/hr over 90 Minutes Intravenous STAT 05/16/20 2153 05/16/20 2348   05/16/20 2000  vancomycin (VANCOREADY) IVPB 1250 mg/250 mL  Status:  Discontinued        1,250 mg 166.7 mL/hr over 90 Minutes Intravenous  Once 05/16/20 1945 05/16/20 2153   05/15/20 1600  piperacillin-tazobactam (ZOSYN) IVPB 3.375 g  Status:  Discontinued  3.375 g 12.5 mL/hr over 240 Minutes Intravenous Every 8 hours 05/15/20 0945 05/30/20 0912   05/15/20 0930  piperacillin-tazobactam (ZOSYN) IVPB 3.375 g        3.375 g 100 mL/hr over 30 Minutes Intravenous  Once 05/15/20 0921 05/15/20 1009   05/13/20 2000  azithromycin (ZITHROMAX) tablet 500 mg        500 mg Oral Daily 05/13/20 1155 05/14/20 2127   05/10/20 2200  cefTRIAXone (ROCEPHIN) 2 g in sodium chloride 0.9 % 100 mL IVPB  Status:  Discontinued        2 g 200 mL/hr over 30 Minutes Intravenous Every 24 hours 05/10/20 2148 05/15/20 0911   05/10/20 2200  azithromycin (ZITHROMAX) 500 mg in sodium chloride 0.9 % 250 mL IVPB  Status:  Discontinued        500 mg 250 mL/hr over 60 Minutes Intravenous Every 24 hours 05/10/20 2148 05/13/20 1155       Subjective: Appears comfortable, no chest pain or sob.   Objective: Vitals:   06/26/20 0430 06/26/20 0632 06/26/20 0728 06/26/20 0737  BP:   139/87   Pulse: 97  (!) 109   Resp:   18 19  Temp:    98.7 F (37.1 C)  TempSrc:    Oral  SpO2:   95% 95%  Weight:  61.1 kg    Height:        Intake/Output Summary (Last 24 hours) at 06/26/2020 0904 Last data filed at 06/26/2020 0728 Gross per 24 hour  Intake 1075.47 ml  Output 787 ml  Net 288.47 ml   Filed Weights   06/24/20 0300 06/25/20 0500 06/26/20 8676  Weight: 60 kg 59.8 kg 61.1 kg    Examination:  General exam: alert, not in any distress.  Respiratory system: diminished at bases, more the right, s/p chest tube on the right. Tachypnea present.  Cardiovascular system: S1S2 HEARD, tachycardic, regular, no JVD, no pedal edema.  Gastrointestinal system:  abdomen is soft, non tender non distended bowel sounds wnl.  Central nervous system: Alert and able to answer some simple questions, his date of birth, oriented to place and person and able to move all extremities.  Extremities: clubbing present.  Skin: No rashes seen.  Psychiatry: Mood is appropriate.     Data Reviewed: I have personally reviewed following labs and imaging studies  CBC: Recent Labs  Lab 06/20/20 0801 06/22/20 1444 06/23/20 0700 06/25/20 0545  WBC 10.4 14.5* 13.4* 10.3  NEUTROABS  --  11.9*  --  7.8*  HGB 9.1* 9.5* 8.4* 8.6*  HCT 30.8* 32.2* 28.7* 29.4*  MCV 81.1 82.1 79.9* 81.0  PLT 434* 466* 421* 463*    Basic Metabolic Panel: Recent Labs  Lab 06/20/20 0801 06/22/20 1444 06/23/20 0700 06/25/20 0545  NA 133* 133* 131* 133*  K 4.1 4.6 4.3 4.3  CL 97* 94* 96* 99  CO2 26 30 25 24   GLUCOSE 157* 141* 143* 107*  BUN 11 12 11 9   CREATININE 0.65 0.85 0.84 0.69  CALCIUM 8.8* 8.9 8.4* 8.8*    GFR: Estimated Creatinine Clearance: 84.9 mL/min (by C-G formula based on SCr of 0.69 mg/dL).  Liver Function Tests: Recent Labs  Lab 06/20/20 0801 06/23/20 0700  AST 61* 45*  ALT 198* 158*  ALKPHOS 145* 144*  BILITOT 0.4 0.4  PROT 7.4 7.3  ALBUMIN 2.2* 2.0*    CBG: Recent Labs  Lab 06/25/20 0613 06/25/20 1144 06/25/20 1619 06/25/20 2114 06/26/20 0622  GLUCAP 117* 127* 128* 133*  153*     Recent Results (from the past 240 hour(s))  Culture, blood (routine x 2)     Status: None   Collection Time: 06/19/20  2:30 PM   Specimen: BLOOD LEFT ARM  Result Value Ref Range Status   Specimen Description BLOOD LEFT ARM  Final   Special Requests   Final    BOTTLES DRAWN AEROBIC AND ANAEROBIC Blood Culture adequate volume   Culture   Final    NO GROWTH 5 DAYS Performed at [facility], [address] [place], [place] [address]    Report Status 06/24/2020 FINAL  Final  Culture, blood (routine x 2)     Status: None   Collection Time: 06/19/20   2:35 PM   Specimen: BLOOD RIGHT HAND  Result Value Ref Range Status   Specimen Description BLOOD RIGHT HAND  Final   Special Requests   Final    BOTTLES DRAWN AEROBIC AND ANAEROBIC Blood Culture adequate volume   Culture   Final    NO GROWTH 5 DAYS Performed at [facility], [address] [place], [place] [address]    Report Status 06/24/2020 FINAL  Final  SARS Coronavirus 2 by RT PCR (hospital order, performed in [facility] hospital lab) Nasopharyngeal Nasopharyngeal Swab     Status: None   Collection Time: 06/21/20  4:20 PM   Specimen: Nasopharyngeal Swab  Result Value Ref Range Status   SARS Coronavirus 2 NEGATIVE NEGATIVE Final    Comment: (NOTE) SARS-CoV-2 target nucleic acids are NOT DETECTED.  The SARS-CoV-2 RNA is generally detectable in upper and lower respiratory specimens during the acute phase of infection. The lowest concentration of SARS-CoV-2 viral copies this assay can detect is 250 copies / mL. A negative result does not preclude SARS-CoV-2 infection and should not be used as the sole basis for treatment or other patient management decisions.  A negative result may occur with improper specimen collection / handling, submission of specimen other than nasopharyngeal swab, presence of viral mutation(s) within the areas targeted by this assay, and inadequate number of viral copies (<250 copies / mL). A negative result must be combined with clinical observations, patient history, and epidemiological information.  Fact Sheet for Patients:   StrictlyIdeas.no  Fact Sheet for Healthcare Providers: BankingDealers.co.za  This test is not yet approved or  cleared by the [place] FDA and has been authorized for detection and/or diagnosis of SARS-CoV-2 by FDA under an Emergency Use Authorization (EUA).  This EUA will remain in effect (meaning this test can be used) for the duration of the COVID-19 declaration  under Section 564(b)(1) of the Act, 21 U.S.C. section 360bbb-3(b)(1), unless the authorization is terminated or revoked sooner.  Performed at [facility], [place] [address], [place], [place] [address]   Culture, blood (Routine X 2) w Reflex to ID Panel     Status: None (Preliminary result)   Collection Time: 06/22/20  9:54 AM   Specimen: BLOOD RIGHT HAND  Result Value Ref Range Status   Specimen Description BLOOD RIGHT HAND  Final   Special Requests   Final    BOTTLES DRAWN AEROBIC ONLY Blood Culture adequate volume   Culture   Final    NO GROWTH 4 DAYS Performed at [facility], [place] [address] [place], [place] [address]    Report Status PENDING  Incomplete  Culture, blood (Routine X 2) w Reflex to ID Panel     Status: None (Preliminary result)  Collection Time: 06/22/20 10:01 AM   Specimen: BLOOD LEFT ARM  Result Value Ref Range Status   Specimen Description BLOOD LEFT ARM  Final   Special Requests   Final    BOTTLES DRAWN AEROBIC AND ANAEROBIC Blood Culture adequate volume   Culture   Final    NO GROWTH 4 DAYS Performed at [facility], [address] [place], [place] [address]    Report Status PENDING  Incomplete  MRSA PCR Screening     Status: Abnormal   Collection Time: 06/23/20 12:30 PM   Specimen: Nasopharyngeal  Result Value Ref Range Status   MRSA by PCR POSITIVE (A) NEGATIVE Final    Comment:        The GeneXpert MRSA Assay (FDA approved for NASAL specimens only), is one component of a comprehensive MRSA colonization surveillance program. It is not intended to diagnose MRSA infection nor to guide or monitor treatment for MRSA infections. RESULT CALLED TO, READ BACK BY AND VERIFIED WITH: L. LEE RN, AT [id number] 06/23/20 [place] Performed at [facility], [place] [address], [place], [place] [address]   Aerobic/Anaerobic Culture (surgical/deep wound)     Status: None (Preliminary result)   Collection Time: 06/25/20 10:58 AM   Specimen:  Pleura; Abscess  Result Value Ref Range Status   Specimen Description PLEURAL ABSCESS RIGHT  Final   Special Requests NONE  Final   Gram Stain   Final    ABUNDANT WBC PRESENT, PREDOMINANTLY PMN ABUNDANT GRAM POSITIVE COCCI ABUNDANT GRAM VARIABLE ROD Performed at [facility], [address] [place], [place] [address]    Culture PENDING  Incomplete   Report Status PENDING  Incomplete         Radiology Studies: CT IMAGE GUIDED DRAINAGE BY PERCUTANEOUS CATHETER  Result Date: 06/25/2020 INDICATION: 59 year old male with left-sided empyema versus lung abscess EXAM: CT GUIDED DRAINAGE OF  ABSCESS MEDICATIONS: The patient is currently admitted to the hospital and receiving intravenous antibiotics. The antibiotics were administered within an appropriate time frame prior to the initiation of the procedure. ANESTHESIA/SEDATION: 0.5 mg IV Versed 25 mcg IV Fentanyl Moderate Sedation Time:  13 minutes The patient was continuously monitored during the procedure by the interventional radiology nurse under my direct supervision. COMPLICATIONS: None TECHNIQUE: Informed written consent was obtained from the patient after a thorough discussion of the procedural risks, benefits and alternatives. All questions were addressed. Maximal Sterile Barrier Technique was utilized including caps, mask, sterile gowns, sterile gloves, sterile drape, hand hygiene and skin antiseptic. A timeout was performed prior to the initiation of the procedure. PROCEDURE: The operative field was prepped with Chlorhexidine in a sterile fashion, and a sterile drape was applied covering the operative field. A sterile gown and sterile gloves were used for the procedure. Local anesthesia was provided with 1% Lidocaine. Scout CT was acquired. Patient was prepped and draped in the usual sterile fashion. 1% lidocaine was used for local anesthesia. Yueh needle was passed into the fluid collection of the right chest confirming the needle tip  position with aspiration of purulent fluid. Using modified Seldinger technique, a 12 French drain was placed and attached to evacuation chamber. Sample was sent for culture Drain was sutured in position.  Sterile bandage was placed. Patient tolerated the procedure well and remained hemodynamically stable throughout. No complications were encountered and no significant blood loss. FINDINGS: Left chest fluid collection, either empyema or lung abscess. IMPRESSION: Status post CT-guided drainage of right chest abscess/empyema. Signed,  Dulcy Fanny. Dellia Nims, RPVI [facility] Radiology Electronically Signed   By: Corrie Mckusick D.O.   On: 06/25/2020 13:06        Scheduled Meds: . apixaban  5 mg Oral BID  . Chlorhexidine Gluconate Cloth  6 each Topical Q0600  . feeding supplement (PROSource TF)  45 mL Per Tube BID  . FLUoxetine  20 mg Per Tube Daily  . insulin aspart  0-24 Units Subcutaneous TID AC & HS  . mouth rinse  15 mL Mouth Rinse q12n4p  . metoprolol tartrate  100 mg Per Tube BID  . mupirocin ointment  1 application Nasal BID  . nystatin  5 mL Oral QID  . pantoprazole sodium  40 mg Per Tube BID  . sennosides  5 mL Per Tube QHS  . traZODone  50 mg Per Tube QHS   Continuous Infusions: . sodium chloride 1,000 mL (06/06/20 0028)  . sodium chloride    . feeding supplement (OSMOLITE 1.5 CAL) 1,000 mL (06/25/20 1510)  . fluconazole (DIFLUCAN) IV    . meropenem (MERREM) IV Stopped (06/26/20 0051)  . vancomycin 500 mg (06/26/20 0421)     LOS: 39 days        Hosie Poisson, MD [facility]   To contact the attending provider between 7A-7P or the covering provider during after hours 7P-7A, please log into the web site [email or web address] and access using universal [place] password for that web site. If you do not have the password, please call the hospital operator.  06/26/2020, 9:04 AM

## 2020-06-26 NOTE — Progress Notes (Signed)
Referring Physician(s): Dr. Kipp Brood  Supervising Physician: Sandi Mariscal  Patient Status:  [facility] - In-pt  Chief Complaint: Entrapped lung   Subjective: 59 y.o, male inpatient. Complex medical history, GERD, Barrett's esophagus. PUD with extensive gastric erosion. Patient presented to the ED with weakness, productive cough and fevers found to have pneumonia.[facility] complicated by a hydropneumothorax with possible lung collapse s/p thoracentesis performed on 8.2.21 . IR placed a right sided chest tube on 9.13.21 for a persistent right pleural fluid collection concerning for abscess. No additional imaging since chest tube placement. Per epic output is: 112 ml. Tan output noted to be in the pleura vac. Patient laying sleeping in bed, calm and comfortable.    Allergies: Patient has no known allergies.  Medications: Prior to Admission medications   Medication Sig Start Date End Date Taking? Authorizing Provider  acetaminophen (TYLENOL) 500 MG tablet Take 1,000 mg by mouth every 6 (six) hours as needed (for pain).   Yes [provider]  amantadine (SYMMETREL) 100 MG capsule Take 100 mg by mouth 2 (two) times daily.    Yes [provider]  amoxicillin (AMOXIL) 500 MG capsule Take 2 capsules (1,000 mg total) by mouth 3 (three) times daily. 05/09/20  Yes Fransico Meadow, PA-C  aspirin 325 MG tablet Take 1 tablet (325 mg total) by mouth daily. 07/16/18  Yes Barton Dubois, MD  atorvastatin (LIPITOR) 40 MG tablet Take 40 mg by mouth daily.   Yes [provider]  benzonatate (TESSALON) 100 MG capsule Take 100 mg by mouth 3 (three) times daily. 05/02/20  Yes [provider]  diltiazem (CARDIZEM CD) 120 MG 24 hr capsule Take 1 capsule (120 mg total) by mouth daily. Patient taking differently: Take 120 mg by mouth daily after breakfast.  09/22/18  Yes Soyla Dryer, PA-C  doxycycline (VIBRA-TABS) 100 MG tablet Take 1 tablet (100 mg total) by mouth 2 (two)  times daily. 05/09/20  Yes Fransico Meadow, PA-C  Ferrous Sulfate (IRON PO) Take 65 mg by mouth 2 (two) times daily with a meal.   Yes [provider]  loratadine (CLARITIN) 10 MG tablet Take 10 mg by mouth daily. 05/02/20  Yes [provider]  Multiple Vitamin (MULTIVITAMIN WITH MINERALS) TABS tablet Take 1 tablet by mouth daily after breakfast.    Yes [provider]  pantoprazole (PROTONIX) 40 MG tablet Take 1 tablet (40 mg total) by mouth 2 (two) times daily before a meal. 09/22/18  Yes Soyla Dryer, PA-C  sulfamethoxazole-trimethoprim (BACTRIM DS) 800-160 MG tablet Take 1 tablet by mouth 2 (two) times daily. 5 day course prescribed on 05/04/2020 05/04/20  Yes [provider]  tamsulosin (FLOMAX) 0.4 MG CAPS capsule Take 0.4 mg by mouth daily. 05/04/20  Yes [provider]  apixaban (ELIQUIS) 5 MG TABS tablet Take 1 tablet (5 mg total) by mouth 2 (two) times daily. 06/22/20   Hosie Poisson, MD  diltiazem (CARDIZEM) 120 MG tablet Take 120 mg by mouth daily. Patient not taking: Reported on 05/11/2020 05/02/20   [provider]  FLUoxetine (PROZAC) 20 MG capsule Place 1 capsule (20 mg total) into feeding tube daily. 06/22/20   Hosie Poisson, MD  insulin aspart (NOVOLOG) 100 UNIT/ML injection Inject 0-24 Units into the skin 4 (four) times daily -  before meals and at bedtime. 06/22/20   Hosie Poisson, MD  ipratropium-albuterol (DUONEB) 0.5-2.5 (3) MG/3ML SOLN Take 3 mLs by nebulization every 4 (four) hours as needed. 06/22/20  Hosie Poisson, MD  metoprolol tartrate (LOPRESSOR) 25 mg/10 mL SUSP Place 40 mLs (100 mg total) into feeding tube 2 (two) times daily. 06/22/20   Hosie Poisson, MD  Nutritional Supplements (FEEDING SUPPLEMENT, OSMOLITE 1.5 CAL,) LIQD Place 1,000 mLs into feeding tube continuous. 06/22/20   Hosie Poisson, MD  Nutritional Supplements (FEEDING SUPPLEMENT, PROSOURCE TF,) liquid Place 45 mLs into feeding tube 2 (two) times daily. 06/22/20    Hosie Poisson, MD  sennosides (SENOKOT) 8.8 MG/5ML syrup Place 5 mLs into feeding tube at bedtime. 06/22/20   Hosie Poisson, MD  traZODone (DESYREL) 50 MG tablet Place 1 tablet (50 mg total) into feeding tube at bedtime. 06/22/20   Hosie Poisson, MD     Vital Signs: BP (!) 143/94   Pulse (!) 109   Temp 98.7 F (37.1 C) (Oral)   Resp 19   Ht 5\' 9"  (1.753 m)   Wt 134 lb 11.2 oz (61.1 kg)   SpO2 95%   BMI 19.89 kg/m   Physical Exam Vitals and nursing note reviewed.  Constitutional:      Appearance: He is well-developed.  HENT:     Head: Normocephalic.  Cardiovascular:     Rate and Rhythm: Normal rate and regular rhythm.  Pulmonary:     Effort: Pulmonary effort is normal.  Chest:     Comments: Chest tube with 110 ml of tan fluid noted in the pleura vac.  Musculoskeletal:        General: Normal range of motion.  Skin:    General: Skin is dry.     Imaging: CT ABDOMEN PELVIS W CONTRAST  Result Date: 06/22/2020 CLINICAL DATA:  Nausea and vomiting, vomited tube feedings EXAM: CT ABDOMEN AND PELVIS WITH CONTRAST TECHNIQUE: Multidetector CT imaging of the abdomen and pelvis was performed using the standard protocol following bolus administration of intravenous contrast. CONTRAST:  176mL OMNIPAQUE IOHEXOL 300 MG/ML  SOLN COMPARISON:  06/19/2020, 05/13/2020 FINDINGS: Lower chest: Persistent complex loculated right pleural fluid collection, suspicious for empyema/abscess given history of esophageal perforation. Trace free-flowing right pleural fluid. Significant consolidation of the right lower lobe consistent with atelectasis. Esophageal stent identified, with fluid and debris within the esophageal lumen. Hepatobiliary: No focal liver abnormality is seen. No gallstones, gallbladder wall thickening, or biliary dilatation. Pancreas: Unremarkable. No pancreatic ductal dilatation or surrounding inflammatory changes. Spleen: Normal in size without focal abnormality. Adrenals/Urinary Tract:  Persistent left-sided hydronephrosis related to an obstructing 11 mm proximal left ureteral calculus. Multiple punctate nonobstructing left renal calculi again identified unchanged. Punctate nonobstructing right renal calculi are identified. Right kidney enhances normally. No right-sided obstruction. Bladder is unremarkable.  The adrenals are normal. Stomach/Bowel: Percutaneous gastrostomy tube is identified. No bowel obstruction or ileus. No bowel wall thickening or inflammatory change. Vascular/Lymphatic: Aortic atherosclerosis. No enlarged abdominal or pelvic lymph nodes. Reproductive: Prostate is unremarkable. Other: No free fluid or free intraperitoneal gas. No abdominal wall hernia. Musculoskeletal: No acute or destructive bony lesions. Reconstructed images demonstrate no additional findings. IMPRESSION: 1. Persistent complex loculated right pleural fluid collection, concerning for abscess. Persistent esophageal perforation cannot be excluded in light of previous findings. 2. Stable esophageal stent, with debris and fluid in the esophageal lumen as before. 3. Persistent left-sided hydronephrosis related to an 11 mm obstructing proximal left ureteral calculus. 4. Other bilateral nonobstructing renal calculi 5.  Aortic Atherosclerosis (ICD10-I70.0). Electronically Signed   By: Randa Ngo M.D.   On: 06/22/2020 19:38   CT IMAGE GUIDED DRAINAGE BY PERCUTANEOUS CATHETER  Result Date: 06/25/2020  INDICATION: 59 year old male with left-sided empyema versus lung abscess EXAM: CT GUIDED DRAINAGE OF  ABSCESS MEDICATIONS: The patient is currently admitted to the hospital and receiving intravenous antibiotics. The antibiotics were administered within an appropriate time frame prior to the initiation of the procedure. ANESTHESIA/SEDATION: 0.5 mg IV Versed 25 mcg IV Fentanyl Moderate Sedation Time:  13 minutes The patient was continuously monitored during the procedure by the interventional radiology nurse under my  direct supervision. COMPLICATIONS: None TECHNIQUE: Informed written consent was obtained from the patient after a thorough discussion of the procedural risks, benefits and alternatives. All questions were addressed. Maximal Sterile Barrier Technique was utilized including caps, mask, sterile gowns, sterile gloves, sterile drape, hand hygiene and skin antiseptic. A timeout was performed prior to the initiation of the procedure. PROCEDURE: The operative field was prepped with Chlorhexidine in a sterile fashion, and a sterile drape was applied covering the operative field. A sterile gown and sterile gloves were used for the procedure. Local anesthesia was provided with 1% Lidocaine. Scout CT was acquired. Patient was prepped and draped in the usual sterile fashion. 1% lidocaine was used for local anesthesia. Yueh needle was passed into the fluid collection of the right chest confirming the needle tip position with aspiration of purulent fluid. Using modified Seldinger technique, a 12 French drain was placed and attached to evacuation chamber. Sample was sent for culture Drain was sutured in position.  Sterile bandage was placed. Patient tolerated the procedure well and remained hemodynamically stable throughout. No complications were encountered and no significant blood loss. FINDINGS: Left chest fluid collection, either empyema or lung abscess. IMPRESSION: Status post CT-guided drainage of right chest abscess/empyema. Signed, Dulcy Fanny. Dellia Nims, RPVI [facility] Radiology Electronically Signed   By: Corrie Mckusick D.O.   On: 06/25/2020 13:06    Labs:  CBC: Recent Labs    06/20/20 0801 06/22/20 1444 06/23/20 0700 06/25/20 0545  WBC 10.4 14.5* 13.4* 10.3  HGB 9.1* 9.5* 8.4* 8.6*  HCT 30.8* 32.2* 28.7* 29.4*  PLT 434* 466* 421* 463*    COAGS: Recent Labs    05/10/20 2111 05/11/20 0536 06/02/20 1240 06/02/20 1524  INR 1.1 1.1 1.2 1.2  APTT 30 31   --  35    BMP: Recent Labs    06/20/20 0801 06/22/20 1444 06/23/20 0700 06/25/20 0545  NA 133* 133* 131* 133*  K 4.1 4.6 4.3 4.3  CL 97* 94* 96* 99  CO2 26 30 25 24   GLUCOSE 157* 141* 143* 107*  BUN 11 12 11 9   CALCIUM 8.8* 8.9 8.4* 8.8*  CREATININE 0.65 0.85 0.84 0.69  GFRNONAA >60 >60 >60 >60  GFRAA >60 >60 >60 >60    LIVER FUNCTION TESTS: Recent Labs    06/14/20 0124 06/17/20 0951 06/20/20 0801 06/23/20 0700  BILITOT 0.4 0.5 0.4 0.4  AST 52* 55* 61* 45*  ALT 132* 154* 198* 158*  ALKPHOS 148* 142* 145* 144*  PROT 7.7 7.5 7.4 7.3  ALBUMIN 2.3* 2.1* 2.2* 2.0*    Assessment and Plan:  59 y.o, male inpatient. Complex medical history, GERD, Barrett's esophagus. PUD with extensive gastric erosion. Patient presented to the ED with weakness, productive cough and fevers found to have pneumonia.[facility] complicated by a hydropneumothorax with possible lung collapse s/p thoracentesis performed on 8.2.21 . IR placed a right sided chest tube on 9.13.21 for a persistent right pleural fluid collection concerning for abscess. No additional imaging since chest tube placement. Per  epic output is: 112 ml. Tan fluid noted to be in pleur-vac.   Per  ID gram stain shows gram positive cocci and gram variable rods. Cultures pending. Previous cultures grew candida parapsilosis. Patient to stable from IR perspective s/p right chest tube further plans per Pulmonology. Team please call IR with questions or concerns    Electronically Signed: Jacqualine Mau, NP 06/26/2020, 9:53 AM   I spent a total of 15 Minutes at the patient's bedside AND on the patient's hospital floor or unit, greater than 50% of which was counseling/coordinating care for right sided chest tube.

## 2020-06-26 NOTE — Progress Notes (Signed)
Patient ID: Evan Chavez, male   DOB: [birth year], 60 y.o.   MRN: [id number]         [facility]  Date of Admission:  05/10/2020   Total days of antibiotics 5        Day 2 vancomycin        Day 2 meropenem        Day 2 fluconazole ASSESSMENT: He has persistent polymicrobial right pleural empyema.  Gram stain shows gram-positive cocci and gram variable rods.  Cultures are pending.  Past cultures grew Candida parapsilosis.  PLAN: 1. Continue current 3 drug antimicrobial regimen pending final cultures  Principal Problem:   Pleural empyema (HCC) Active Problems:   Pneumothorax on right   Esophageal perforation   Essential hypertension   Atrial tachycardia (HCC)   Dyslipidemia   Transaminitis   Gastroesophageal reflux disease with esophagitis   Barrett's esophagus   Thyroid nodule   BPH (benign prostatic hyperplasia)   Ureteral stone with hydronephrosis   Acute respiratory failure with hypoxia (HCC)   Malnutrition of moderate degree   Pressure injury of skin   Atrial fibrillation with RVR (HCC)   Right lower lobe pulmonary infiltrate   Dysphagia   Status post thoracentesis   Scheduled Meds: . apixaban  5 mg Oral BID  . Chlorhexidine Gluconate Cloth  6 each Topical Q0600  . feeding supplement (PROSource TF)  45 mL Per Tube BID  . FLUoxetine  20 mg Per Tube Daily  . insulin aspart  0-24 Units Subcutaneous TID AC & HS  . mouth rinse  15 mL Mouth Rinse q12n4p  . metoprolol tartrate  100 mg Per Tube BID  . mupirocin ointment  1 application Nasal BID  . nystatin  5 mL Oral QID  . pantoprazole sodium  40 mg Per Tube BID  . sennosides  5 mL Per Tube QHS  . traZODone  50 mg Per Tube QHS   Continuous Infusions: . sodium chloride 1,000 mL (06/06/20 0028)  . sodium chloride    . feeding supplement (OSMOLITE 1.5 CAL) 1,000 mL (06/25/20 1510)  . fluconazole (DIFLUCAN) IV    . meropenem (MERREM) IV Stopped (06/26/20 0051)  . vancomycin 500 mg  (06/26/20 0421)   PRN Meds:.sodium chloride, [CANCELED] Place/Maintain arterial line **AND** sodium chloride, acetaminophen (TYLENOL) oral liquid 160 mg/5 mL, fentaNYL (SUBLIMAZE) injection, guaiFENesin-dextromethorphan, hydrALAZINE, ipratropium-albuterol, metoprolol tartrate, ondansetron (ZOFRAN) IV, oxyCODONE, phenol, traMADol   SUBJECTIVE: He simply nods his head no when I asked him if he was feeling better.  Review of Systems: Review of Systems  Unable to perform ROS: Critical illness    No Known Allergies  OBJECTIVE: Vitals:   06/26/20 0430 06/26/20 0632 06/26/20 0728 06/26/20 0737  BP:   139/87   Pulse: 97  (!) 109   Resp:   18 19  Temp:    98.7 F (37.1 C)  TempSrc:    Oral  SpO2:   95% 95%  Weight:  61.1 kg    Height:       Body mass index is 19.89 kg/m.  Physical Exam Constitutional:      Comments: He is resting quietly in bed.  Cardiovascular:     Rate and Rhythm: Normal rate and regular rhythm.     Heart sounds: No murmur heard.   Pulmonary:     Effort: Pulmonary effort is normal.     Comments: Shallow respirations. Psychiatric:     Comments: Flat affect.  Lab Results Lab Results  Component Value Date   WBC 10.3 06/25/2020   HGB 8.6 (L) 06/25/2020   HCT 29.4 (L) 06/25/2020   MCV 81.0 06/25/2020   PLT 463 (H) 06/25/2020    Lab Results  Component Value Date   CREATININE 0.69 06/25/2020   BUN 9 06/25/2020   NA 133 (L) 06/25/2020   K 4.3 06/25/2020   CL 99 06/25/2020   CO2 24 06/25/2020    Lab Results  Component Value Date   ALT 158 (H) 06/23/2020   AST 45 (H) 06/23/2020   ALKPHOS 144 (H) 06/23/2020   BILITOT 0.4 06/23/2020     Microbiology: Recent Results (from the past 240 hour(s))  Culture, blood (routine x 2)     Status: None   Collection Time: 06/19/20  2:30 PM   Specimen: BLOOD LEFT ARM  Result Value Ref Range Status   Specimen Description BLOOD LEFT ARM  Final   Special Requests   Final    BOTTLES DRAWN AEROBIC AND  ANAEROBIC Blood Culture adequate volume   Culture   Final    NO GROWTH 5 DAYS Performed at [facility], [place] [address], [place], [place] [address]    Report Status 06/24/2020 FINAL  Final  Culture, blood (routine x 2)     Status: None   Collection Time: 06/19/20  2:35 PM   Specimen: BLOOD RIGHT HAND  Result Value Ref Range Status   Specimen Description BLOOD RIGHT HAND  Final   Special Requests   Final    BOTTLES DRAWN AEROBIC AND ANAEROBIC Blood Culture adequate volume   Culture   Final    NO GROWTH 5 DAYS Performed at [facility], [place] [address] [place], [place] [address]    Report Status 06/24/2020 FINAL  Final  SARS Coronavirus 2 by RT PCR (hospital order, performed in [facility] hospital lab) Nasopharyngeal Nasopharyngeal Swab     Status: None   Collection Time: 06/21/20  4:20 PM   Specimen: Nasopharyngeal Swab  Result Value Ref Range Status   SARS Coronavirus 2 NEGATIVE NEGATIVE Final    Comment: (NOTE) SARS-CoV-2 target nucleic acids are NOT DETECTED.  The SARS-CoV-2 RNA is generally detectable in upper and lower respiratory specimens during the acute phase of infection. The lowest concentration of SARS-CoV-2 viral copies this assay can detect is 250 copies / mL. A negative result does not preclude SARS-CoV-2 infection and should not be used as the sole basis for treatment or other patient management decisions.  A negative result may occur with improper specimen collection / handling, submission of specimen other than nasopharyngeal swab, presence of viral mutation(s) within the areas targeted by this assay, and inadequate number of viral copies (<250 copies / mL). A negative result must be combined with clinical observations, patient history, and epidemiological information.  Fact Sheet for Patients:   StrictlyIdeas.no  Fact Sheet for Healthcare Providers: BankingDealers.co.za  This test is not yet  approved or  cleared by the [place] FDA and has been authorized for detection and/or diagnosis of SARS-CoV-2 by FDA under an Emergency Use Authorization (EUA).  This EUA will remain in effect (meaning this test can be used) for the duration of the COVID-19 declaration under Section 564(b)(1) of the Act, 21 U.S.C. section 360bbb-3(b)(1), unless the authorization is terminated or revoked sooner.  Performed at [facility], [place] [address], [place], [place] [address]   Culture, blood (Routine X 2) w Reflex to ID Panel  Status: None (Preliminary result)   Collection Time: 06/22/20  9:54 AM   Specimen: BLOOD RIGHT HAND  Result Value Ref Range Status   Specimen Description BLOOD RIGHT HAND  Final   Special Requests   Final    BOTTLES DRAWN AEROBIC ONLY Blood Culture adequate volume   Culture   Final    NO GROWTH 4 DAYS Performed at [facility], [address] [place], [place] [address]    Report Status PENDING  Incomplete  Culture, blood (Routine X 2) w Reflex to ID Panel     Status: None (Preliminary result)   Collection Time: 06/22/20 10:01 AM   Specimen: BLOOD LEFT ARM  Result Value Ref Range Status   Specimen Description BLOOD LEFT ARM  Final   Special Requests   Final    BOTTLES DRAWN AEROBIC AND ANAEROBIC Blood Culture adequate volume   Culture   Final    NO GROWTH 4 DAYS Performed at [facility], [place] [address], [place], [place] [address]    Report Status PENDING  Incomplete  MRSA PCR Screening     Status: Abnormal   Collection Time: 06/23/20 12:30 PM   Specimen: Nasopharyngeal  Result Value Ref Range Status   MRSA by PCR POSITIVE (A) NEGATIVE Final    Comment:        The GeneXpert MRSA Assay (FDA approved for NASAL specimens only), is one component of a comprehensive MRSA colonization surveillance program. It is not intended to diagnose MRSA infection nor to guide or monitor treatment for MRSA infections. RESULT CALLED TO, READ BACK BY  AND VERIFIED WITH: L. LEE RN, AT 0712 06/23/20 [place] Performed at [facility], [place] [address] [place], [place] [address]   Aerobic/Anaerobic Culture (surgical/deep wound)     Status: None (Preliminary result)   Collection Time: 06/25/20 10:58 AM   Specimen: Pleura; Abscess  Result Value Ref Range Status   Specimen Description PLEURAL ABSCESS RIGHT  Final   Special Requests NONE  Final   Gram Stain   Final    ABUNDANT WBC PRESENT, PREDOMINANTLY PMN ABUNDANT GRAM POSITIVE COCCI ABUNDANT GRAM VARIABLE ROD Performed at [facility], [address], [place], [place] [address]    Culture PENDING  Incomplete   Report Status PENDING  Incomplete    Michel Bickers, MD [facility] [phone] pager   [phone] cell 06/26/2020, 9:15 AM

## 2020-06-26 NOTE — Progress Notes (Signed)
Nutrition Follow-up  DOCUMENTATION CODES:   Non-severe (moderate) malnutrition in context of chronic illness  INTERVENTION:   Continue tube feeding via PEG: -Osmolite 1.5'@60ml' /hr(1440 ml/day) - ProSource TF 45 ml BID -Free water flushes per MD  Tube feeding regimenprovides2240kcal,112grams of protein, and1086m of H2O.  NUTRITION DIAGNOSIS:   Moderate Malnutrition related to chronic illness (Barrett's esophagus, GERD) as evidenced by moderate fat depletion, moderate muscle depletion.  Ongoing  GOAL:   Patient will meet greater than or equal to 90% of their needs  Met with TF  MONITOR:   Diet advancement, Labs, Weight trends, TF tolerance, Skin, I & O's  REASON FOR ASSESSMENT:   Consult Enteral/tube feeding initiation and management  ASSESSMENT:   59year old male who presented on 7/28 with fever, SOB. PMH of stroke, HLD, GERD, Barrett's esophagus, PUD, s/p balloon stricturoplasty for GE junction in 2020. Admitted with severe sepsis secondary to bilateral pleural effusions with concomitant bibasilar pneumonia.  8/02 - MBSS with recommendation for regular diet with thin liquids, s/p thoracentesis 8/04 - s/p chest tube insertion, chest CT showing esophageal perforation 8/05 - s/p VATS, esophageal stent placement, PEG tube placement 8/13- s/pright thoracoscopy, drainage of loculated pleural effusion, esophagogastroscopy,esophageal stent placement, esophagram with Omnipaque 8/16 - swallow study showing no leak 8/19 - diet advanced to clear liquids 8/25 - FEES showing no observable oropharyngeal dysphagia (other than oral aversion) with recommendation for thin liquids 9/13- s/p rt chest tube placement (empyema vs pulmonary abscess); nystatin ordered for suspected oral thrush 9/14- per ID gram stain reveals gram-positive cocci and gram variable rods; cultures pending  Reviewed I/O's: +289 ml x 24 hours and +11.2 L since 06/12/20  UOP: 675 ml x 24  hours  Chest tube output: 112 ml x 24 hours  Pt resting quietly at time of visit. He did not respond to name being called. He continues to be NPO (was previously on clear liquid diet, but refusing oral intake).   Case discussed with RN, who reports plan to resume TF once replacement bottle of formula is sent from pharmacy. Pt has been tolerating TF well.   Current TF: Osmolite 1.5 @ 60 ml/hr, ProSource TF 45 ml BID.   Labs reviewed: CBGS: 120-153 (inpatient orders for glycemic control are 0-24 units insulin aspart TID wit meals and at bedtime).   Diet Order:   Diet Order            Diet NPO time specified Except for: Sips with Meds  Diet effective midnight           Diet - low sodium heart healthy                 EDUCATION NEEDS:   Education needs have been addressed  Skin:  Skin Assessment: Reviewed RN Assessment Skin Integrity Issues:: Stage II, Other (Comment) Stage II: R buttocks Incisions: right chest Other: skin tear to L buttocks  Last BM:  06/24/20  Height:   Ht Readings from Last 1 Encounters:  05/17/20 '5\' 9"'  (1.753 m)    Weight:   Wt Readings from Last 1 Encounters:  06/26/20 61.1 kg    Ideal Body Weight:  72.7 kg  BMI:  Body mass index is 19.89 kg/m.  Estimated Nutritional Needs:   Kcal:  2000-2200  Protein:  100-115 grams  Fluid:  >/= 2.0 L    JLoistine Chance RD, LDN, CLa UnionRegistered Dietitian II Certified Diabetes Care and Education Specialist Please refer to AAtrium Medical Centerfor RD and/or RD on-call/weekend/after hours pager

## 2020-06-27 ENCOUNTER — Inpatient Hospital Stay (HOSPITAL_COMMUNITY): Payer: Medicaid Other

## 2020-06-27 LAB — CBC WITH DIFFERENTIAL/PLATELET
Abs Immature Granulocytes: 0.06 10*3/uL (ref 0.00–0.07)
Basophils Absolute: 0 10*3/uL (ref 0.0–0.1)
Basophils Relative: 0 %
Eosinophils Absolute: 0.1 10*3/uL (ref 0.0–0.5)
Eosinophils Relative: 2 %
HCT: 29 % — ABNORMAL LOW (ref 39.0–52.0)
Hemoglobin: 8.3 g/dL — ABNORMAL LOW (ref 13.0–17.0)
Immature Granulocytes: 1 %
Lymphocytes Relative: 17 %
Lymphs Abs: 1.6 10*3/uL (ref 0.7–4.0)
MCH: 23.2 pg — ABNORMAL LOW (ref 26.0–34.0)
MCHC: 28.6 g/dL — ABNORMAL LOW (ref 30.0–36.0)
MCV: 81 fL (ref 80.0–100.0)
Monocytes Absolute: 0.9 10*3/uL (ref 0.1–1.0)
Monocytes Relative: 9 %
Neutro Abs: 6.5 10*3/uL (ref 1.7–7.7)
Neutrophils Relative %: 71 %
Platelets: 476 10*3/uL — ABNORMAL HIGH (ref 150–400)
RBC: 3.58 MIL/uL — ABNORMAL LOW (ref 4.22–5.81)
RDW: 16.3 % — ABNORMAL HIGH (ref 11.5–15.5)
WBC: 9.2 10*3/uL (ref 4.0–10.5)
nRBC: 0 % (ref 0.0–0.2)

## 2020-06-27 LAB — COMPREHENSIVE METABOLIC PANEL
ALT: 124 U/L — ABNORMAL HIGH (ref 0–44)
AST: 38 U/L (ref 15–41)
Albumin: 1.9 g/dL — ABNORMAL LOW (ref 3.5–5.0)
Alkaline Phosphatase: 140 U/L — ABNORMAL HIGH (ref 38–126)
Anion gap: 5 (ref 5–15)
BUN: 9 mg/dL (ref 6–20)
CO2: 26 mmol/L (ref 22–32)
Calcium: 8.6 mg/dL — ABNORMAL LOW (ref 8.9–10.3)
Chloride: 102 mmol/L (ref 98–111)
Creatinine, Ser: 0.68 mg/dL (ref 0.61–1.24)
GFR calc Af Amer: >60 mL/min (ref >60)
GFR calc non Af Amer: >60 mL/min (ref >60)
Glucose, Bld: 139 mg/dL — ABNORMAL HIGH (ref 70–99)
Potassium: 4.1 mmol/L (ref 3.5–5.1)
Sodium: 133 mmol/L — ABNORMAL LOW (ref 135–145)
Total Bilirubin: 0.3 mg/dL (ref 0.3–1.2)
Total Protein: 7.3 g/dL (ref 6.5–8.1)

## 2020-06-27 LAB — GLUCOSE, CAPILLARY
Glucose-Capillary: 136 mg/dL — ABNORMAL HIGH (ref 70–99)
Glucose-Capillary: 156 mg/dL — ABNORMAL HIGH (ref 70–99)
Glucose-Capillary: 125 mg/dL — ABNORMAL HIGH (ref 70–99)
Glucose-Capillary: 111 mg/dL — ABNORMAL HIGH (ref 70–99)

## 2020-06-27 LAB — CULTURE, BLOOD (ROUTINE X 2)
Culture: NO GROWTH
Culture: NO GROWTH
Special Requests: ADEQUATE
Special Requests: ADEQUATE

## 2020-06-27 NOTE — Plan of Care (Signed)
  Problem: Education: Goal: Knowledge of General Education information will improve Description: Including pain rating scale, medication(s)/side effects and non-pharmacologic comfort measures Outcome: Progressing   Problem: Clinical Measurements: Goal: Ability to maintain clinical measurements within normal limits will improve Outcome: Progressing   Problem: Clinical Measurements: Goal: Will remain free from infection Outcome: Progressing   Problem: Clinical Measurements: Goal: Diagnostic test results will improve Outcome: Progressing   Problem: Activity: Goal: Risk for activity intolerance will decrease Outcome: Progressing   Problem: Nutrition: Goal: Adequate nutrition will be maintained Outcome: Progressing   Problem: Pain Managment: Goal: General experience of comfort will improve Outcome: Progressing

## 2020-06-27 NOTE — Progress Notes (Signed)
Occupational Therapy Treatment  Patient agreeable to get up to chair, does not verbally communicate with OT only nods head yes/no. Patient min G for bed mobility with decreased initiation, mod A with cues for use of momentum to stand and min hand held assist x1 to stand pivot to recliner chair with cues for safety managing multiple tubes/lines and to safely align himself in recliner chair. Declined seated g/h when presented with wash cloth. Will continue to follow.    06/27/20 1000  OT Visit Information  Last OT Received On 06/27/20  Assistance Needed +1 (+2 for line management)  History of Present Illness Pt is 59 yo male presents to the hospital on 7/28 with generalized weakness, productive cough and low-grade fever. Pt is currently being treated for hydropneumothorax, Sepsis aspiration pneumonia and esophagal rupture, AKI and acute delirium. s/p R thoracentesis on 8/2. Chest tube placed 8/2-8/9. s/p VATS, decortication, EGD, PEG tube placement, and a esophageal stent on 8/9. PMH of GERD, Barrett's esophagus, PUD with extensive gastric erosion, duodenal ulcer and esophageal stricture, stroke, HLD, BPH, cocaine abuse. s/p Right thoracoscopy, chest tube placement, esophageal stent placement 8/13-8/20. 9/13- s/p rt chest tube placement   Precautions  Precautions Fall  Precaution Comments PEG, right weakness, R chest tube  Pain Assessment  Pain Assessment Faces  Faces Pain Scale 4  Pain Location chest tube site  Pain Descriptors / Indicators Grimacing  Pain Intervention(s) Monitored during session  Cognition  Arousal/Alertness Awake/alert  Behavior During Therapy Flat affect  Overall Cognitive Status No family/caregiver present to determine baseline cognitive functioning  Area of Impairment Following commands;Problem solving  Following Commands Follows one step commands with increased time  Problem Solving Requires verbal cues;Slow processing  General Comments patient communicate non  verbally throughout session with nodding yes or no  ADL  Overall ADL's  Needs assistance/impaired  Grooming Details (indicate cue type and reason) declined washing his face  Toilet Transfer Moderate assistance;Minimal assistance;Stand-pivot;BSC  Toilet Transfer Details (indicate cue type and reason) to recliner, mod A to power up to standing, min A to stand pivot with cues for safety due to multiple lines  Functional mobility during ADLs Minimal assistance;Cueing for safety;Cueing for sequencing  General ADL Comments Pt continues to present with decreased activity tolerance impacting his safety and performance of ADLs  Bed Mobility  Overal bed mobility Needs Assistance  Bed Mobility Supine to Sit  Supine to sit Min guard;HOB elevated  General bed mobility comments min guard for safety, decreased difficulty problem solving mobility with multiple lines  Balance  Overall balance assessment Needs assistance  Sitting-balance support No upper extremity supported;Feet supported  Sitting balance-Leahy Scale Good  Standing balance support Single extremity supported;During functional activity  Standing balance-Leahy Scale Poor  Standing balance comment reliant on hand held assist x1 in static standing  Restrictions  Weight Bearing Restrictions No  Transfers  Overall transfer level Needs assistance  Equipment used 1 person hand held assist  Transfers Sit to/from Stand;Stand Pivot Transfers  Sit to Stand Mod assist  Stand pivot transfers Min assist  General transfer comment mod A with cues for using momentum to power up to standing, min A with hand held assist for stability to pivot to recliner chair with decreased eccentric control   General Comments  General comments (skin integrity, edema, etc.) VSS   OT - End of Session  Activity Tolerance Patient tolerated treatment well;Other (comment) (limited interaction from patient )  Patient left in chair;with call bell/phone within reach;with chair  alarm set  Nurse Communication Mobility status;Other (comment) (condom cath fell off)  OT Assessment/Plan  OT Plan Discharge plan remains appropriate  OT Visit Diagnosis Unsteadiness on feet (R26.81);Other abnormalities of gait and mobility (R26.89);Muscle weakness (generalized) (M62.81);Other symptoms and signs involving cognitive function;Feeding difficulties (R63.3)  Pain - Right/Left Right  Pain - part of body  (chest)  OT Frequency (ACUTE ONLY) Min 2X/week  Follow Up Recommendations SNF;Supervision/Assistance - 24 hour  OT Equipment Other (comment) (defer to next venue of care)  AM-PAC OT "6 Clicks" Daily Activity Outcome Measure (Version 2)  Help from another person eating meals? 2 (pt not initiating self eating)  Help from another person taking care of personal grooming? 2 (decreased thoroughness/initiation)  Help from another person toileting, which includes using toliet, bedpan, or urinal? 2  Help from another person bathing (including washing, rinsing, drying)? 2  Help from another person to put on and taking off regular upper body clothing? 2  Help from another person to put on and taking off regular lower body clothing? 2  6 Click Score 12  OT Goal Progression  Progress towards OT goals Progressing toward goals  Acute Rehab OT Goals  Patient Stated Goal patient agreeable to up to chair  OT Goal Formulation With patient  Time For Goal Achievement 07/06/20  Potential to Achieve Goals Good  ADL Goals  Pt Will Perform Grooming with supervision;standing  Pt Will Perform Lower Body Dressing with set-up;with supervision;sit to/from stand  Pt Will Transfer to Toilet with supervision;ambulating;regular height toilet;grab bars  Additional ADL Goal #1 Pt will be Min VCs to follow energy conservation stratgies with basic ADLs  OT Time Calculation  OT Start Time (ACUTE ONLY) 0733  OT Stop Time (ACUTE ONLY) 0756  OT Time Calculation (min) 23 min  OT General Charges  $OT Visit 1  Visit  OT Treatments  $Self Care/Home Management  23-37 mins   Evan Chavez OT OT pager: [phone]

## 2020-06-27 NOTE — Progress Notes (Signed)
[facility] Progress Note  Patient: Evan Chavez    WUX:[id number]  DOA: 05/10/2020     Date of Service: the patient was seen and examined on 06/27/2020  Brief hospital course: 59 year old gentleman with prior history of GERD, Barrett's esophagus, peptic ulcer disease with extensive gastric erosion, duodenal ulcer and esophageal stricture s/p balloon dilatation on 3/2 0 20, CVA, hyperlipidemia, BPH, remote cocaine abuse presents with weakness productive cough, fever and dyspnea was found to have community-acquired pneumonia.Hospital course complicated by right sided hydropneumothorax with possible lung collapse s/p thoracentesis on the right on 05/14/2020, esophageal perforation with right sided hydropneumothorax, complicated by aspiration pneumonia. Patient underwent right sided chest tube placement, transferred from [facility] to [facility] for VATS and decortication by CT surgery as well as esophageal stent placement and PEG placement on 05/16/20. Patient had episodes of persistent leakage from esophagus on esophagogram as well as underwent drainage of loculated pleural effusion on 05/25/20 by CT surgery. Repeat esophagogram on 05/28/2020 did not show any esophageal leak. CT chest with contrast on 9/7 showed Interval increase in a loculated right posteromedial pleural fluid collection with surrounding enhancing pleura, possibly empyema. CT surgery re consulted, suggested that residual entrapped lung and poor pulmonary toilet. From 9/10  pt had low grade temp with elevated wbc count, and CT abd and pelvis shows loculated complex pleural effusion , possibly an abscess. TCTS on board and he underwent chest tube placement on the right for empyema, fluid send for gram stain and culture.   Currently plan is monitor chest tube progression.  Assessment and Plan: Acute respiratory failure with hypoxia secondary to community-acquired pneumonia, Parapneumonic effusion. Patient underwent chest tube placement initially by  cardiothoracic surgery.  Pleural fluid culture significant for Candida parapsilosis.  He completed the course of cefepime and fluconazole. He was weaned off oxygen.   currently patient remains on RA with good sats, but he remains tachypneic and tachycardic.  On 9/9- 9/10 pt had fevers, elevated WBC count, tachypnea, he was started on IV cefepime changed to IV Meropenam for possible lung abscess. Dr Kipp Brood with TCTS recommended CT guided chest tube by IR, done on 06/25/2020.  Repeat cultures are pending. ID consulted for antibiotics and duration.   Hydropneumothorax s/p chest tube placement by CT surgery.  Resolved.    Sepsis present on admission:  In the setting of aspiration pneumonia, empyema and mediastinitis. Sepsis physiology improved. But he continues to be tachypnic and tachycardic.    Lung abscess/empyema; Fevers , leukocytosis, on 9/10 started him IV cefepime changed to IV meropenam by ID , IV vancomycin and fluconazole added.  CT guided chest tube placement scheduled for today. ID on board and appreciate recommendations.    Esophageal rupture s/p stent placement by CT surgery. Persistent esophageal perforation on CT abd and pelvis.     Oropharyngeal dysphagia s/p PEG placement on tube feeds.   SLP evaluation recommending clear liquid diet for now but pt is refusing to take any by mouth. He denies any odynophagia, RN is concerned of white patches on the tongue , suspect oral candidiasis , will start him on nystatin swishes.    History of paroxysmal atrial fibrillation Sinus tachycardia improved with increase in metoprolol.  CT angiogram is negative for PE. Continue with metoprolol for rate control and Eliquis for anticoagulation . Last echocardiogram showed Left ventricular ejection fraction, by estimation, is 65 to 70%. The  left ventricle has normal function. The left ventricle has no regional  wall motion abnormalities. Left ventricular diastolic  parameters are  consistent with Grade I diastolic  dysfunction (impaired relaxation).    Acute on chronic blood loss anemia in the setting of blood loss from esophageal rupture/postop anemia/ microcytic anemia:  in addition to hematemesis which have all resolved.   Hemoglobin stable around 8.     Chronic sinus tachycardia Heart rates between 90s to110/min.  Increased metoprolol to 100 mg BID.     History of stroke with residual right-sided weakness.   Patient was on Lipitor as an outpatient which was being held for elevated liver enzymes. Recheck liver enzymes in 2 weeks and restart lipitor if liver enzymes wnl.    Left hydronephrosis with ureteral stone. CT abg shows persistent left hydronephrosis.  -Outpatient follow-up with urology for shockwave lithotripsy.   Hyponatremia Sodium at 133.   Depression Continue with Prozac daily.  Moderate protein calorie malnutrition. Body mass index is 19.79 kg/m.  Nutrition Problem: Moderate Malnutrition Etiology: chronic illness (Barrett's esophagus, GERD) Interventions: Interventions: Tube feeding  Diet: Tube feeding DVT Prophylaxis:   SCD's Start: 05/25/20 1355 Place and maintain sequential compression device Start: 05/23/20 1040 apixaban (ELIQUIS) tablet 5 mg    Advance goals of care discussion: Full code  Family Communication: no family was present at bedside, at the time of interview.   Disposition:  Status is: Inpatient  Remains inpatient appropriate because:IV treatments appropriate due to intensity of illness or inability to take PO   Dispo:  Patient From:    Planned Disposition: [place]  Expected discharge date: 07/03/20  Medically stable for discharge:           Subjective: Remains fatigue and tired.  Nausea no vomiting.  No acute events overnight.  Physical Exam:  General: Appear in mild distress, no Rash; Oral Mucosa Clear, moist. no Abnormal Neck Mass Or lumps, Conjunctiva  normal  Cardiovascular: S1 and S2 Present, no Murmur, Respiratory: good respiratory effort, Bilateral Air entry present and bilateral  Crackles, no wheezes Abdomen: Bowel Sound present, Soft and no tenderness Extremities: no Pedal edema Neurology: alert and oriented to time, place, and person affect appropriate. no new focal deficit Gait not checked due to patient safety concerns  Vitals:   06/27/20 1057 06/27/20 1139 06/27/20 1657 06/27/20 1937  BP: (!) 143/92 (!) 155/95 (!) 124/91 (!) 138/96  Pulse:  (!) 105  91  Resp: (!) 29  (!) 28 (!) 22  Temp: 98.1 F (36.7 C)  98.6 F (37 C) 97.9 F (36.6 C)  TempSrc: Oral  Oral Oral  SpO2:    99%  Weight:      Height:        Intake/Output Summary (Last 24 hours) at 06/27/2020 2028 Last data filed at 06/27/2020 1937 Gross per 24 hour  Intake 2583.25 ml  Output 1353 ml  Net 1230.25 ml   Filed Weights   06/25/20 0500 06/26/20 2202 06/27/20 0400  Weight: 59.8 kg 61.1 kg 60.8 kg    Data Reviewed: I have personally reviewed and interpreted daily labs, tele strips, imagings as discussed above. I reviewed all nursing notes, pharmacy notes, vitals, pertinent old records I have discussed plan of care as described above with RN and patient/family.  CBC: Recent Labs  Lab 06/22/20 1444 06/23/20 0700 06/25/20 0545 06/27/20 0031  WBC 14.5* 13.4* 10.3 9.2  NEUTROABS 11.9*  --  7.8* 6.5  HGB 9.5* 8.4* 8.6* 8.3*  HCT 32.2* 28.7* 29.4* 29.0*  MCV 82.1 79.9* 81.0 81.0  PLT 466* 421* 463* 476*   Basic  Metabolic Panel: Recent Labs  Lab 06/22/20 1444 06/23/20 0700 06/25/20 0545 06/27/20 0031  NA 133* 131* 133* 133*  K 4.6 4.3 4.3 4.1  CL 94* 96* 99 102  CO2 30 25 24 26   GLUCOSE 141* 143* 107* 139*  BUN 12 11 9 9   CREATININE 0.85 0.84 0.69 0.68  CALCIUM 8.9 8.4* 8.8* 8.6*    Studies: DG Chest Port 1 View  Result Date: 06/27/2020 CLINICAL DATA:  Empyema of right pleural space. EXAM: PORTABLE CHEST 1 VIEW COMPARISON:  June 22, 2020. FINDINGS: Stable cardiomediastinal silhouette. Esophageal stent is unchanged in position. There is interval placement of pleural drainage catheter in the right lung base. Right pleural effusion noted on prior exam is slightly decreased in size. No pneumothorax is noted. Left lung is clear. Bony thorax is unremarkable. IMPRESSION: Interval placement of pleural drainage catheter in right lung base. Right pleural effusion noted on prior exam is slightly decreased in size. Electronically Signed   By: Marijo Conception M.D.   On: 06/27/2020 12:25    Scheduled Meds: . apixaban  5 mg Oral BID  . Chlorhexidine Gluconate Cloth  6 each Topical Q0600  . feeding supplement (PROSource TF)  45 mL Per Tube BID  . FLUoxetine  20 mg Per Tube Daily  . insulin aspart  0-24 Units Subcutaneous TID AC & HS  . mouth rinse  15 mL Mouth Rinse q12n4p  . metoprolol tartrate  100 mg Per Tube BID  . mupirocin ointment  1 application Nasal BID  . nystatin  5 mL Oral QID  . pantoprazole sodium  40 mg Per Tube BID  . sennosides  5 mL Per Tube QHS  . traZODone  50 mg Per Tube QHS   Continuous Infusions: . sodium chloride 1,000 mL (06/06/20 0028)  . sodium chloride    . feeding supplement (OSMOLITE 1.5 CAL) 60 mL/hr at 06/27/20 1901  . fluconazole (DIFLUCAN) IV 100 mL/hr at 06/27/20 1901  . meropenem (MERREM) IV 200 mL/hr at 06/27/20 1901  . vancomycin 100 mL/hr at 06/27/20 1901   PRN Meds: sodium chloride, [CANCELED] Place/Maintain arterial line **AND** sodium chloride, acetaminophen (TYLENOL) oral liquid 160 mg/5 mL, fentaNYL (SUBLIMAZE) injection, guaiFENesin-dextromethorphan, hydrALAZINE, ipratropium-albuterol, metoprolol tartrate, ondansetron (ZOFRAN) IV, oxyCODONE, phenol, traMADol  Time spent: 35 minutes  Author: Berle Mull, MD [facility] 06/27/2020 8:28 PM  To reach On-call, see care teams to locate the attending and reach out via [email or web address]. Between 7PM-7AM, please contact  night-coverage If you still have difficulty reaching the attending provider, please page the [facility] (Director on Call) for [facility] on amion for assistance.

## 2020-06-27 NOTE — Progress Notes (Signed)
Referring Physician(s): [place]  Supervising Physician: Suttle,D  Patient Status:  [facility] - In-pt  Chief Complaint:  Cough, right chest empyema  Subjective: Pt doing ok this am; denies worsening CP/dyspnea; has occ cough; only nods head to questions   Allergies: Patient has no known allergies.  Medications: Prior to Admission medications   Medication Sig Start Date End Date Taking? Authorizing Provider  acetaminophen (TYLENOL) 500 MG tablet Take 1,000 mg by mouth every 6 (six) hours as needed (for pain).   Yes [provider]  amantadine (SYMMETREL) 100 MG capsule Take 100 mg by mouth 2 (two) times daily.    Yes [provider]  amoxicillin (AMOXIL) 500 MG capsule Take 2 capsules (1,000 mg total) by mouth 3 (three) times daily. 05/09/20  Yes Fransico Meadow, PA-C  aspirin 325 MG tablet Take 1 tablet (325 mg total) by mouth daily. 07/16/18  Yes Barton Dubois, MD  atorvastatin (LIPITOR) 40 MG tablet Take 40 mg by mouth daily.   Yes [provider]  benzonatate (TESSALON) 100 MG capsule Take 100 mg by mouth 3 (three) times daily. 05/02/20  Yes [provider]  diltiazem (CARDIZEM CD) 120 MG 24 hr capsule Take 1 capsule (120 mg total) by mouth daily. Patient taking differently: Take 120 mg by mouth daily after breakfast.  09/22/18  Yes Soyla Dryer, PA-C  doxycycline (VIBRA-TABS) 100 MG tablet Take 1 tablet (100 mg total) by mouth 2 (two) times daily. 05/09/20  Yes Fransico Meadow, PA-C  Ferrous Sulfate (IRON PO) Take 65 mg by mouth 2 (two) times daily with a meal.   Yes [provider]  loratadine (CLARITIN) 10 MG tablet Take 10 mg by mouth daily. 05/02/20  Yes [provider]  Multiple Vitamin (MULTIVITAMIN WITH MINERALS) TABS tablet Take 1 tablet by mouth daily after breakfast.    Yes [provider]  pantoprazole (PROTONIX) 40 MG tablet Take 1 tablet (40 mg total) by mouth 2 (two) times daily before a meal.  09/22/18  Yes Soyla Dryer, PA-C  sulfamethoxazole-trimethoprim (BACTRIM DS) 800-160 MG tablet Take 1 tablet by mouth 2 (two) times daily. 5 day course prescribed on 05/04/2020 05/04/20  Yes [provider]  tamsulosin (FLOMAX) 0.4 MG CAPS capsule Take 0.4 mg by mouth daily. 05/04/20  Yes [provider]  apixaban (ELIQUIS) 5 MG TABS tablet Take 1 tablet (5 mg total) by mouth 2 (two) times daily. 06/22/20   Hosie Poisson, MD  diltiazem (CARDIZEM) 120 MG tablet Take 120 mg by mouth daily. Patient not taking: Reported on 05/11/2020 05/02/20   [provider]  FLUoxetine (PROZAC) 20 MG capsule Place 1 capsule (20 mg total) into feeding tube daily. 06/22/20   Hosie Poisson, MD  insulin aspart (NOVOLOG) 100 UNIT/ML injection Inject 0-24 Units into the skin 4 (four) times daily -  before meals and at bedtime. 06/22/20   Hosie Poisson, MD  ipratropium-albuterol (DUONEB) 0.5-2.5 (3) MG/3ML SOLN Take 3 mLs by nebulization every 4 (four) hours as needed. 06/22/20   Hosie Poisson, MD  metoprolol tartrate (LOPRESSOR) 25 mg/10 mL SUSP Place 40 mLs (100 mg total) into feeding tube 2 (two) times daily. 06/22/20   Hosie Poisson, MD  Nutritional Supplements (FEEDING SUPPLEMENT, OSMOLITE 1.5 CAL,) LIQD Place 1,000 mLs into feeding tube continuous. 06/22/20   Hosie Poisson, MD  Nutritional Supplements (FEEDING SUPPLEMENT, PROSOURCE TF,) liquid Place 45 mLs into feeding tube 2 (two) times daily. 06/22/20   Hosie Poisson, MD  sennosides (SENOKOT) 8.8  MG/5ML syrup Place 5 mLs into feeding tube at bedtime. 06/22/20   Hosie Poisson, MD  traZODone (DESYREL) 50 MG tablet Place 1 tablet (50 mg total) into feeding tube at bedtime. 06/22/20   Hosie Poisson, MD     Vital Signs: BP (!) 143/92 (BP Location: Right Arm)   Pulse 95   Temp 98.1 F (36.7 C) (Oral)   Resp (!) 29   Ht 5\' 9"  (1.753 m)   Wt 134 lb 0.6 oz (60.8 kg)   SpO2 96%   BMI 19.79 kg/m   Physical Exam awake; rt chest tube intact,  insertion site covered with intact, clean gauze dressing, sl tender to palpation, about 220 cc cream colored fluid in pleuravac, no obvious air leak; tube to wall suction  Imaging: CT IMAGE GUIDED DRAINAGE BY PERCUTANEOUS CATHETER  Result Date: 06/25/2020 INDICATION: 59 year old male with left-sided empyema versus lung abscess EXAM: CT GUIDED DRAINAGE OF  ABSCESS MEDICATIONS: The patient is currently admitted to the hospital and receiving intravenous antibiotics. The antibiotics were administered within an appropriate time frame prior to the initiation of the procedure. ANESTHESIA/SEDATION: 0.5 mg IV Versed 25 mcg IV Fentanyl Moderate Sedation Time:  13 minutes The patient was continuously monitored during the procedure by the interventional radiology nurse under my direct supervision. COMPLICATIONS: None TECHNIQUE: Informed written consent was obtained from the patient after a thorough discussion of the procedural risks, benefits and alternatives. All questions were addressed. Maximal Sterile Barrier Technique was utilized including caps, mask, sterile gowns, sterile gloves, sterile drape, hand hygiene and skin antiseptic. A timeout was performed prior to the initiation of the procedure. PROCEDURE: The operative field was prepped with Chlorhexidine in a sterile fashion, and a sterile drape was applied covering the operative field. A sterile gown and sterile gloves were used for the procedure. Local anesthesia was provided with 1% Lidocaine. Scout CT was acquired. Patient was prepped and draped in the usual sterile fashion. 1% lidocaine was used for local anesthesia. Yueh needle was passed into the fluid collection of the right chest confirming the needle tip position with aspiration of purulent fluid. Using modified Seldinger technique, a 12 French drain was placed and attached to evacuation chamber. Sample was sent for culture Drain was sutured in position.  Sterile bandage was placed. Patient tolerated the  procedure well and remained hemodynamically stable throughout. No complications were encountered and no significant blood loss. FINDINGS: Left chest fluid collection, either empyema or lung abscess. IMPRESSION: Status post CT-guided drainage of right chest abscess/empyema. Signed, Dulcy Fanny. Dellia Nims, RPVI [facility] Radiology Electronically Signed   By: Corrie Mckusick D.O.   On: 06/25/2020 13:06    Labs:  CBC: Recent Labs    06/22/20 1444 06/23/20 0700 06/25/20 0545 06/27/20 0031  WBC 14.5* 13.4* 10.3 9.2  HGB 9.5* 8.4* 8.6* 8.3*  HCT 32.2* 28.7* 29.4* 29.0*  PLT 466* 421* 463* 476*    COAGS: Recent Labs    05/10/20 2111 05/11/20 0536 06/02/20 1240 06/02/20 1524  INR 1.1 1.1 1.2 1.2  APTT 30 31  --  35    BMP: Recent Labs    06/22/20 1444 06/23/20 0700 06/25/20 0545 06/27/20 0031  NA 133* 131* 133* 133*  K 4.6 4.3 4.3 4.1  CL 94* 96* 99 102  CO2 30 25 24 26   GLUCOSE 141* 143* 107* 139*  BUN 12 11 9 9   CALCIUM 8.9 8.4* 8.8* 8.6*  CREATININE 0.85 0.84 0.69 0.68  GFRNONAA >60 >  60 >60 >60  GFRAA >60 >60 >60 >60    LIVER FUNCTION TESTS: Recent Labs    06/17/20 0951 06/20/20 0801 06/23/20 0700 06/27/20 0031  BILITOT 0.5 0.4 0.4 0.3  AST 55* 61* 45* 38  ALT 154* 198* 158* 124*  ALKPHOS 142* 145* 144* 140*  PROT 7.5 7.4 7.3 7.3  ALBUMIN 2.1* 2.2* 2.0* 1.9*    Assessment and Plan: Pt with hx rt chest empyema, prior esoph rupture with stenting, prior CVA; s/p rt chest drain placement 9/13; afebrile; WBC nl; hgb 8.3, creat nl; drain fl cx pend; check CXR today; monitor drain OP-once minimal obtain f/u CT to reassess for improvement in empyema drainage; other plans as per TCTS/TRH/ID   Electronically Signed: D. Rowe Robert, PA-C 06/27/2020, 11:26 AM   I spent a total of 15 minutes at the the patient's bedside AND on the patient's hospital floor or unit, greater than 50% of which was counseling/coordinating  care for right chest drain    Patient ID: Evan Chavez, male   DOB: 07-18-1961, 59 y.o.   MRN: [id number]

## 2020-06-27 NOTE — Progress Notes (Signed)
PT Cancellation Note  Patient Details Name: Evan Chavez MRN: [id number] DOB: 1960-12-25   Cancelled Treatment:    Reason Eval/Treat Not Completed: Patient declined, no reason specified (pt supine on arrival, shaking head no to moving and participating. Pt gesturing to back when asked if he has pain and nodded yes. pt would not verbalize reason but clearly demonstrating refusal)   Glendoris Nodarse B Gayatri Teasdale 06/27/2020, 12:26 PM Bayard Males, PT Acute Rehabilitation Services Pager: [phone] Office: [phone]

## 2020-06-27 NOTE — Progress Notes (Signed)
Patient ID: Evan Chavez, male   DOB: March 13, 1961, 59 y.o.   MRN: [id number]         [facility] for Infectious Disease  Date of Admission:  05/10/2020   Total days of antibiotics 6        Day 3 vancomycin        Day 3 meropenem        Day 3 fluconazole ASSESSMENT: He has persistent polymicrobial right pleural empyema.  Gram stain showed mixed bacterial flora compatible with aerobic anaerobic infection.  Cultures are pending.  Past cultures grew Candida parapsilosis.  PLAN: 1. Continue current 3 drug antimicrobial regimen pending final cultures  Principal Problem:   Pleural empyema (HCC) Active Problems:   Pneumothorax on right   Esophageal perforation   Essential hypertension   Atrial tachycardia (HCC)   Dyslipidemia   Transaminitis   Gastroesophageal reflux disease with esophagitis   Barrett's esophagus   Thyroid nodule   BPH (benign prostatic hyperplasia)   Ureteral stone with hydronephrosis   Acute respiratory failure with hypoxia (HCC)   Malnutrition of moderate degree   Pressure injury of skin   Atrial fibrillation with RVR (HCC)   Right lower lobe pulmonary infiltrate   Dysphagia   Status post thoracentesis   Scheduled Meds:  apixaban  5 mg Oral BID   Chlorhexidine Gluconate Cloth  6 each Topical Q0600   feeding supplement (PROSource TF)  45 mL Per Tube BID   FLUoxetine  20 mg Per Tube Daily   insulin aspart  0-24 Units Subcutaneous TID AC & HS   mouth rinse  15 mL Mouth Rinse q12n4p   metoprolol tartrate  100 mg Per Tube BID   mupirocin ointment  1 application Nasal BID   nystatin  5 mL Oral QID   pantoprazole sodium  40 mg Per Tube BID   sennosides  5 mL Per Tube QHS   traZODone  50 mg Per Tube QHS   Continuous Infusions:  sodium chloride 1,000 mL (06/06/20 0028)   sodium chloride     feeding supplement (OSMOLITE 1.5 CAL) 1,000 mL (06/26/20 1417)   fluconazole (DIFLUCAN) IV Stopped (06/26/20 1625)   meropenem (MERREM) IV 1  g (06/27/20 0859)   vancomycin Stopped (06/27/20 0258)   PRN Meds:.sodium chloride, [CANCELED] Place/Maintain arterial line **AND** sodium chloride, acetaminophen (TYLENOL) oral liquid 160 mg/5 mL, fentaNYL (SUBLIMAZE) injection, guaiFENesin-dextromethorphan, hydrALAZINE, ipratropium-albuterol, metoprolol tartrate, ondansetron (ZOFRAN) IV, oxyCODONE, phenol, traMADol   SUBJECTIVE: He simply nods his head no when I asked him if he was feeling better.  He indicates that he is not in pain, not coughing and not short of breath.  Review of Systems: Review of Systems  Unable to perform ROS: Critical illness    No Known Allergies  OBJECTIVE: Vitals:   06/26/20 2310 06/26/20 2311 06/27/20 0400 06/27/20 0721  BP: 125/81  121/84 132/82  Pulse: 88  97 95  Resp: (!) 26 (!) 22 (!) 24 (!) 30  Temp: 98.2 F (36.8 C)  98.2 F (36.8 C) 99 F (37.2 C)  TempSrc: Oral  Oral Oral  SpO2: 93%  94% 96%  Weight:   60.8 kg   Height:       Body mass index is 19.79 kg/m.  Physical Exam Constitutional:      Comments: He is resting quietly in bed.  Cardiovascular:     Rate and Rhythm: Normal rate and regular rhythm.     Heart sounds: No murmur heard.   Pulmonary:  Effort: Pulmonary effort is normal.     Comments: Shallow respirations.  90 cc of chest tube output recorded yesterday.    Lab Results Lab Results  Component Value Date   WBC 9.2 06/27/2020   HGB 8.3 (L) 06/27/2020   HCT 29.0 (L) 06/27/2020   MCV 81.0 06/27/2020   PLT 476 (H) 06/27/2020    Lab Results  Component Value Date   CREATININE 0.68 06/27/2020   BUN 9 06/27/2020   NA 133 (L) 06/27/2020   K 4.1 06/27/2020   CL 102 06/27/2020   CO2 26 06/27/2020    Lab Results  Component Value Date   ALT 124 (H) 06/27/2020   AST 38 06/27/2020   ALKPHOS 140 (H) 06/27/2020   BILITOT 0.3 06/27/2020     Microbiology: Recent Results (from the past 240 hour(s))  Culture, blood (routine x 2)     Status: None   Collection  Time: 06/19/20  2:30 PM   Specimen: BLOOD LEFT ARM  Result Value Ref Range Status   Specimen Description BLOOD LEFT ARM  Final   Special Requests   Final    BOTTLES DRAWN AEROBIC AND ANAEROBIC Blood Culture adequate volume   Culture   Final    NO GROWTH 5 DAYS Performed at [facility], [place] [address], [place], [place] [address]    Report Status 06/24/2020 FINAL  Final  Culture, blood (routine x 2)     Status: None   Collection Time: 06/19/20  2:35 PM   Specimen: BLOOD RIGHT HAND  Result Value Ref Range Status   Specimen Description BLOOD RIGHT HAND  Final   Special Requests   Final    BOTTLES DRAWN AEROBIC AND ANAEROBIC Blood Culture adequate volume   Culture   Final    NO GROWTH 5 DAYS Performed at [facility], [place] [address], [place], [place] [address]    Report Status 06/24/2020 FINAL  Final  SARS Coronavirus 2 by RT PCR (hospital order, performed in [facility] hospital lab) Nasopharyngeal Nasopharyngeal Swab     Status: None   Collection Time: 06/21/20  4:20 PM   Specimen: Nasopharyngeal Swab  Result Value Ref Range Status   SARS Coronavirus 2 NEGATIVE NEGATIVE Final    Comment: (NOTE) SARS-CoV-2 target nucleic acids are NOT DETECTED.  The SARS-CoV-2 RNA is generally detectable in upper and lower respiratory specimens during the acute phase of infection. The lowest concentration of SARS-CoV-2 viral copies this assay can detect is 250 copies / mL. A negative result does not preclude SARS-CoV-2 infection and should not be used as the sole basis for treatment or other patient management decisions.  A negative result may occur with improper specimen collection / handling, submission of specimen other than nasopharyngeal swab, presence of viral mutation(s) within the areas targeted by this assay, and inadequate number of viral copies (<250 copies / mL). A negative result must be combined with clinical observations, patient history, and epidemiological  information.  Fact Sheet for Patients:   StrictlyIdeas.no  Fact Sheet for Healthcare Providers: BankingDealers.co.za  This test is not yet approved or  cleared by the [place] FDA and has been authorized for detection and/or diagnosis of SARS-CoV-2 by FDA under an Emergency Use Authorization (EUA).  This EUA will remain in effect (meaning this test can be used) for the duration of the COVID-19 declaration under Section 564(b)(1) of the Act, 21 U.S.C. section 360bbb-3(b)(1), unless the authorization is terminated or revoked sooner.  Performed at  [facility], [place] [address], [place], [place] [address]   Culture, blood (Routine X 2) w Reflex to ID Panel     Status: None (Preliminary result)   Collection Time: 06/22/20  9:54 AM   Specimen: BLOOD RIGHT HAND  Result Value Ref Range Status   Specimen Description BLOOD RIGHT HAND  Final   Special Requests   Final    BOTTLES DRAWN AEROBIC ONLY Blood Culture adequate volume   Culture   Final    NO GROWTH 4 DAYS Performed at [facility], [place] [address], [place], [place] [address]    Report Status PENDING  Incomplete  Culture, blood (Routine X 2) w Reflex to ID Panel     Status: None (Preliminary result)   Collection Time: 06/22/20 10:01 AM   Specimen: BLOOD LEFT ARM  Result Value Ref Range Status   Specimen Description BLOOD LEFT ARM  Final   Special Requests   Final    BOTTLES DRAWN AEROBIC AND ANAEROBIC Blood Culture adequate volume   Culture   Final    NO GROWTH 4 DAYS Performed at [facility], [place] [address] [place], [place] [address]    Report Status PENDING  Incomplete  MRSA PCR Screening     Status: Abnormal   Collection Time: 06/23/20 12:30 PM   Specimen: Nasopharyngeal  Result Value Ref Range Status   MRSA by PCR POSITIVE (A) NEGATIVE Final    Comment:        The GeneXpert MRSA Assay (FDA approved for NASAL specimens only), is one component of  a comprehensive MRSA colonization surveillance program. It is not intended to diagnose MRSA infection nor to guide or monitor treatment for MRSA infections. RESULT CALLED TO, READ BACK BY AND VERIFIED WITH: L. LEE RN, AT 8242 06/23/20 [place] Performed at [facility], [place] [address] [place], [place] [address]   Aerobic/Anaerobic Culture (surgical/deep wound)     Status: None (Preliminary result)   Collection Time: 06/25/20 10:58 AM   Specimen: Pleura; Abscess  Result Value Ref Range Status   Specimen Description PLEURAL ABSCESS RIGHT  Final   Special Requests NONE  Final   Gram Stain   Final    ABUNDANT WBC PRESENT, PREDOMINANTLY PMN ABUNDANT GRAM POSITIVE COCCI ABUNDANT GRAM VARIABLE ROD    Culture   Final    CULTURE REINCUBATED FOR BETTER GROWTH Performed at [facility], [place] [address], [place],  [address]    Report Status PENDING  Incomplete    Michel Bickers, MD [facility] for Infectious [place] [facility] [phone] pager   [phone] cell 06/27/2020, 9:24 AM

## 2020-06-28 LAB — BASIC METABOLIC PANEL
Anion gap: 7 (ref 5–15)
BUN: 9 mg/dL (ref 6–20)
CO2: 26 mmol/L (ref 22–32)
Calcium: 8.8 mg/dL — ABNORMAL LOW (ref 8.9–10.3)
Chloride: 101 mmol/L (ref 98–111)
Creatinine, Ser: 0.63 mg/dL (ref 0.61–1.24)
GFR calc Af Amer: >60 mL/min (ref >60)
GFR calc non Af Amer: >60 mL/min (ref >60)
Glucose, Bld: 142 mg/dL — ABNORMAL HIGH (ref 70–99)
Potassium: 4.6 mmol/L (ref 3.5–5.1)
Sodium: 134 mmol/L — ABNORMAL LOW (ref 135–145)

## 2020-06-28 LAB — VANCOMYCIN, TROUGH: Vancomycin Tr: 5 ug/mL — ABNORMAL LOW (ref 15–20)

## 2020-06-28 LAB — GLUCOSE, CAPILLARY
Glucose-Capillary: 129 mg/dL — ABNORMAL HIGH (ref 70–99)
Glucose-Capillary: 119 mg/dL — ABNORMAL HIGH (ref 70–99)
Glucose-Capillary: 118 mg/dL — ABNORMAL HIGH (ref 70–99)
Glucose-Capillary: 119 mg/dL — ABNORMAL HIGH (ref 70–99)

## 2020-06-28 LAB — CBC
HCT: 29 % — ABNORMAL LOW (ref 39.0–52.0)
Hemoglobin: 8.3 g/dL — ABNORMAL LOW (ref 13.0–17.0)
MCH: 23.1 pg — ABNORMAL LOW (ref 26.0–34.0)
MCHC: 28.6 g/dL — ABNORMAL LOW (ref 30.0–36.0)
MCV: 80.8 fL (ref 80.0–100.0)
Platelets: 509 10*3/uL — ABNORMAL HIGH (ref 150–400)
RBC: 3.59 MIL/uL — ABNORMAL LOW (ref 4.22–5.81)
RDW: 16.1 % — ABNORMAL HIGH (ref 11.5–15.5)
WBC: 8.6 10*3/uL (ref 4.0–10.5)
nRBC: 0 % (ref 0.0–0.2)

## 2020-06-28 MED ORDER — VANCOMYCIN HCL 750 MG/150ML IV SOLN
750.0000 mg | Freq: Three times a day (TID) | INTRAVENOUS | Status: DC
  Administered 2020-06-28 – 2020-07-05 (×21): 750 mg via INTRAVENOUS
  Filled 2020-06-28 (×21): qty 150

## 2020-06-28 NOTE — Plan of Care (Signed)
  Problem: Health Behavior/Discharge Planning: Goal: Ability to manage health-related needs will improve Outcome: Progressing   Problem: Clinical Measurements: Goal: Ability to maintain clinical measurements within normal limits will improve Outcome: Progressing   Problem: Clinical Measurements: Goal: Will remain free from infection Outcome: Progressing   Problem: Clinical Measurements: Goal: Diagnostic test results will improve Outcome: Progressing   Problem: Activity: Goal: Risk for activity intolerance will decrease Outcome: Progressing   Problem: Nutrition: Goal: Adequate nutrition will be maintained Outcome: Progressing   Problem: Pain Managment: Goal: General experience of comfort will improve Outcome: Progressing   Problem: Skin Integrity: Goal: Risk for impaired skin integrity will decrease Outcome: Progressing

## 2020-06-28 NOTE — Progress Notes (Signed)
Physical Therapy Treatment Patient Details Name: Evan Chavez MRN: [id number] DOB: 1961-08-25 Today's Date: 06/28/2020    History of Present Illness Pt is 59 yo male presents to the hospital on 7/28 with generalized weakness, productive cough and low-grade fever. Pt is currently being treated for hydropneumothorax, Sepsis aspiration pneumonia and esophagal rupture, AKI and acute delirium. s/p R thoracentesis on 8/2. Chest tube placed 8/2-8/9. s/p VATS, decortication, EGD, PEG tube placement, and a esophageal stent on 8/9. PMH of GERD, Barrett's esophagus, PUD with extensive gastric erosion, duodenal ulcer and esophageal stricture, stroke, HLD, BPH, cocaine abuse. s/p Right thoracoscopy, chest tube placement, esophageal stent placement 8/13-8/20. 9/13- s/p rt chest tube placement     PT Comments    Pt received in recliner, agreeable to ambulation. He required min assist sit to stand and min assist ambulation 40' with RW. Pt returned to recliner at end of session, feet elevated.   Follow Up Recommendations  SNF     Equipment Recommendations  3in1 (PT);Rolling walker with 5" wheels    Recommendations for Other Services       Precautions / Restrictions Precautions Precautions: Fall;Other (comment) Precaution Comments: PEG, right weakness, R chest tube    Mobility  Bed Mobility               General bed mobility comments: Pt received in recliner and returned to recliner.  Transfers Overall transfer level: Needs assistance Equipment used: Rolling walker (2 wheeled) Transfers: Sit to/from Stand Sit to Stand: Min assist         General transfer comment: assist to power up  Ambulation/Gait Ambulation/Gait assistance: Min assist Gait Distance (Feet): 40 Feet Assistive device: Rolling walker (2 wheeled) Gait Pattern/deviations: Step-through pattern;Decreased stride length;Trunk flexed Gait velocity: decreased Gait velocity interpretation: <1.31 ft/sec, indicative of  household ambulator General Gait Details: assist to maintain balance, increased time   Stairs             Wheelchair Mobility    Modified Rankin (Stroke Patients Only)       Balance Overall balance assessment: Needs assistance Sitting-balance support: No upper extremity supported;Feet supported Sitting balance-Leahy Scale: Good     Standing balance support: Bilateral upper extremity supported;During functional activity Standing balance-Leahy Scale: Poor Standing balance comment: reliant on external support                            Cognition Arousal/Alertness: Awake/alert Behavior During Therapy: Flat affect Overall Cognitive Status: No family/caregiver present to determine baseline cognitive functioning                                 General Comments: Non verbal but nods yes/no. Following commands consistently.      Exercises      General Comments General comments (skin integrity, edema, etc.): VSS      Pertinent Vitals/Pain Pain Assessment: Faces Faces Pain Scale: Hurts a little bit Pain Location: chest tube site Pain Descriptors / Indicators: Grimacing Pain Intervention(s): Monitored during session;Repositioned;Limited activity within patient's tolerance    Home Living                      Prior Function            PT Goals (current goals can now be found in the care plan section) Acute Rehab PT Goals Patient Stated Goal: not stated PT Goal  Formulation: With patient Time For Goal Achievement: 07/07/20 Potential to Achieve Goals: Good Progress towards PT goals: Progressing toward goals    Frequency    Min 2X/week      PT Plan Current plan remains appropriate    Co-evaluation              AM-PAC PT "6 Clicks" Mobility   Outcome Measure  Help needed turning from your back to your side while in a flat bed without using bedrails?: A Little Help needed moving from lying on your back to sitting on  the side of a flat bed without using bedrails?: A Little Help needed moving to and from a bed to a chair (including a wheelchair)?: A Little Help needed standing up from a chair using your arms (e.g., wheelchair or bedside chair)?: A Little Help needed to walk in hospital room?: A Little Help needed climbing 3-5 steps with a railing? : A Lot 6 Click Score: 17    End of Session Equipment Utilized During Treatment: Gait belt Activity Tolerance: Patient tolerated treatment well Patient left: in chair;with call bell/phone within reach;with chair alarm set Nurse Communication: Mobility status PT Visit Diagnosis: Unsteadiness on feet (R26.81);Muscle weakness (generalized) (M62.81)     Time: 3151-7616 PT Time Calculation (min) (ACUTE ONLY): 23 min  Charges:  $Gait Training: 23-37 mins                     Lorrin Goodell, [place]  Office # [phone] Pager [phone]    Lorriane Shire 06/28/2020, 12:33 PM

## 2020-06-28 NOTE — Progress Notes (Signed)
[facility] Progress Note  Patient: Evan Chavez    MHD:[id number]  DOA: 05/10/2020     Date of Service: the patient was seen and examined on 06/28/2020  Brief hospital course: Past medical history of GERD, Barrett's esophagus, peptic ulcer disease with hiatal hernia, duodenal ulcer, CVA, HLD, BPH remote history of substance abuse. Presented with pneumonia with parapneumonic effusion. Hospital course complicated by right sided hydropneumothorax with possible lung collapse s/p thoracentesis on the right on 05/14/2020, esophageal perforation with right sided hydropneumothorax, complicated by aspiration pneumonia. Patient underwent right sided chest tube placement, transferred from [facility] to [facility] for VATS and decortication by CT surgery as well as esophageal stent placement and PEG placement on 05/16/20.  7/28 presentation to [facility], ED with pneumonia, discharged 7/29 presented on this time admitted with worsening symptoms. 8/2, right thoracentesis complicated by pneumothorax Initially managed conservatively. 8/4, CT chest showed esophageal perforation arising from the right lateral aspect of the mid to distal esophagus and large right-sided hydropneumothorax.   8/4 right-sided chest tube placement.  Transfer[facility] from [facility] to [facility]. 8/5, patient underwent right video-assisted thoracoscopy, decortication, EGD, PEG tube placement, esophageal stent placement with a 25 X 125 mm stent by CT surgery. 8/11, repeat esophagogram showed persistently leakage from esophagus to right pleural space. 8/12 A. fib with RVR 8/13, patient underwent right thoracoscopy, drainage of loculated pleural effusion, esophagus gastroscopy, esophageal stent placement and esophagogram. 8/16, esophagram did not show any esophageal leak. 9/13 IR guided right chest tube placement, cultures growing staph aureus, ID consulted.   Currently plan is follow-up on the culture growth and antibiotics, continue chest tube and follow-up on  guidance from IR and CT surgery.  Assessment and Plan: 1.  Community-acquired pneumonia. Acute respiratory failure with hypoxia POA. Parapneumonic effusion. Hydropneumothorax SP chest tube placement as well as VATS by CT surgery. Empyema. Sepsis POA Sepsis physiology present on admission with tachycardia tachypnea as well as leukocytosis. Continue to worsen. Underwent thoracentesis and developed pneumothorax and later on had hydropneumothorax from esophageal perforation. Treated by VATS by cardiothoracic surgery and chest tube placement. Patient was on broad antibiotic spectrum. Due to worsening fever and tachycardia patient had another chest tube placement on 06/25/2020 after initial removal. Cultures are growing MRSA as well as an anaerobe. Patient prior also grew Candida. Currently on 3 antibiotic regimen per ID recommendation. Patient has an esophageal stent will be removed the week of September 16 and we will evaluate for healing  2.  Esophageal perforation rupture. Last CT scan did not show any significant leakage.  Monitor.  3.  Oropharyngeal dysphagia. PEG tube placement. On tube feeds. Patient remains n.p.o. for now. Monitor.  4.  History of paroxysmal A. fib Metoprolol. CT angiogram negative for PE. Rate currently controlled. Echocardiogram showed preserved EF without any wall motion abnormality or valvular abnormality. Anticoagulation with Eliquis for now.  5.  Acute on chronic blood loss anemia H&H relatively stable. Monitor.  6.  History of stroke with residual right-sided weakness Resume Lipitor once LFTs are stable. Currently on Eliquis.  7.  Moderate protein calorie malnutrition Body mass index is 19.66 kg/m.  Nutrition Problem: Moderate Malnutrition Etiology: chronic illness (Barrett's esophagus, GERD) Interventions: Interventions: Tube feeding  8.  Left-sided hydronephrosis with ureteral stone Seen initially on presentation. Urology was  consulted. Outpatient follow-up with cystoscopy lithotripsy recommended by urology.  9.  Hyponatremia. Hypokalemia. Currently resolved.  10.  Depression. Patient is on Prozac.  Currently continuing. Diet: N.p.o. DVT Prophylaxis:   SCD's Start:  05/25/20 1355 Place and maintain sequential compression device Start: 05/23/20 1040 apixaban (ELIQUIS) tablet 5 mg    Advance goals of care discussion: Full code  Family Communication: no family was present at bedside, at the time of interview.   Disposition:  Status is: Inpatient  Remains inpatient appropriate because:Inpatient level of care appropriate due to severity of illness   Dispo:  Patient From:    Planned Disposition: [place]  Expected discharge date: 07/03/20  Medically stable for discharge:     Subjective: Continues to have significant purulent output from the chest tube.  Continues to have chest pain.  No nausea or vomiting.  Breathing okay.  Physical Exam:  General: Appear in mild distress, no Rash; Oral Mucosa Clear, moist. no Abnormal Neck Mass Or lumps, Conjunctiva normal  Cardiovascular: S1 and S2 Present, no Murmur, Respiratory: good respiratory effort, Bilateral Air entry present and bilateral  Crackles, no wheezes Abdomen: Bowel Sound present, Soft and no tenderness Extremities: no Pedal edema Neurology: alert and remains nonverbal affect appropriate. no new focal deficit Gait not checked due to patient safety concerns  Vitals:   06/27/20 1937 06/27/20 2135 06/27/20 2342 06/28/20 0325  BP: (!) 138/96 (!) 141/95 127/87 127/88  Pulse: 91 90 84 90  Resp: (!) 22  (!) 21 (!) 23  Temp: 97.9 F (36.6 C)  98.5 F (36.9 C) 98.7 F (37.1 C)  TempSrc: Oral  Oral Oral  SpO2: 99%  98% 96%  Weight:    60.4 kg  Height:        Intake/Output Summary (Last 24 hours) at 06/28/2020 0720 Last data filed at 06/28/2020 0325 Gross per 24 hour  Intake 3150.19 ml  Output 1560 ml  Net 1590.19 ml   Filed  Weights   06/26/20 1610 06/27/20 0400 06/28/20 0325  Weight: 61.1 kg 60.8 kg 60.4 kg    Data Reviewed: I have personally reviewed and interpreted daily labs, tele strips, imagings as discussed above. I reviewed all nursing notes, pharmacy notes, vitals, pertinent old records I have discussed plan of care as described above with RN and patient/family.  CBC: Recent Labs  Lab 06/22/20 1444 06/23/20 0700 06/25/20 0545 06/27/20 0031 06/28/20 0228  WBC 14.5* 13.4* 10.3 9.2 8.6  NEUTROABS 11.9*  --  7.8* 6.5  --   HGB 9.5* 8.4* 8.6* 8.3* 8.3*  HCT 32.2* 28.7* 29.4* 29.0* 29.0*  MCV 82.1 79.9* 81.0 81.0 80.8  PLT 466* 421* 463* 476* 960*   Basic Metabolic Panel: Recent Labs  Lab 06/22/20 1444 06/23/20 0700 06/25/20 0545 06/27/20 0031 06/28/20 0228  NA 133* 131* 133* 133* 134*  K 4.6 4.3 4.3 4.1 4.6  CL 94* 96* 99 102 101  CO2 30 25 24 26 26   GLUCOSE 141* 143* 107* 139* 142*  BUN 12 11 9 9 9   CREATININE 0.85 0.84 0.69 0.68 0.63  CALCIUM 8.9 8.4* 8.8* 8.6* 8.8*    Studies: DG Chest Port 1 View  Result Date: 06/27/2020 CLINICAL DATA:  Empyema of right pleural space. EXAM: PORTABLE CHEST 1 VIEW COMPARISON:  June 22, 2020. FINDINGS: Stable cardiomediastinal silhouette. Esophageal stent is unchanged in position. There is interval placement of pleural drainage catheter in the right lung base. Right pleural effusion noted on prior exam is slightly decreased in size. No pneumothorax is noted. Left lung is clear. Bony thorax is unremarkable. IMPRESSION: Interval placement of pleural drainage catheter in right lung base. Right pleural effusion noted on prior exam is slightly decreased in size.  Electronically Signed   By: Marijo Conception M.D.   On: 06/27/2020 12:25    Scheduled Meds:  apixaban  5 mg Oral BID   feeding supplement (PROSource TF)  45 mL Per Tube BID   FLUoxetine  20 mg Per Tube Daily   insulin aspart  0-24 Units Subcutaneous TID AC & HS   mouth rinse  15 mL  Mouth Rinse q12n4p   metoprolol tartrate  100 mg Per Tube BID   mupirocin ointment  1 application Nasal BID   nystatin  5 mL Oral QID   pantoprazole sodium  40 mg Per Tube BID   sennosides  5 mL Per Tube QHS   traZODone  50 mg Per Tube QHS   Continuous Infusions:  sodium chloride 1,000 mL (06/06/20 0028)   sodium chloride     feeding supplement (OSMOLITE 1.5 CAL) 60 mL/hr at 06/27/20 1901   fluconazole (DIFLUCAN) IV 100 mL/hr at 06/27/20 1901   meropenem (MERREM) IV 1 g (06/27/20 2342)   vancomycin 500 mg (06/28/20 0020)   PRN Meds: sodium chloride, [CANCELED] Place/Maintain arterial line **AND** sodium chloride, acetaminophen (TYLENOL) oral liquid 160 mg/5 mL, fentaNYL (SUBLIMAZE) injection, guaiFENesin-dextromethorphan, hydrALAZINE, ipratropium-albuterol, metoprolol tartrate, ondansetron (ZOFRAN) IV, oxyCODONE, phenol, traMADol  Time spent: 35 minutes  Author: Berle Mull, MD [facility] 06/28/2020 7:20 AM  To reach On-call, see care teams to locate the attending and reach out via [email or web address]. Between 7PM-7AM, please contact night-coverage If you still have difficulty reaching the attending provider, please page the [facility] (Director on Call) for [facility] on amion for assistance.

## 2020-06-28 NOTE — Progress Notes (Addendum)
Pharmacy Antibiotic Note  Evan Chavez is a 59 y.o. male admitted on 05/10/2020 with lung abscess.  Pharmacy has been consulted for vancomycin dosing.  WBC 8.6, afebrile. Scr 0.63 (CrCl 83 mL/min). Underwent R chest tube placement per IR. On day #4 of meropenem/vancomycin/fluconazole since start on 9/13. Pleural R abscess showing MRSA. Vancomycin trough came back subtherapeutic at 5.  Plan: Change vancomycin to 750 mg IV every 8 hours Meropenem 1g IV every 8 hours Fluconazole 400 mg IV every 24 hours  Monitor renal fx, cx results, clinical pic, and ID recommendations for de-escalation  Height: 5\' 9"  (175.3 cm) Weight: 60.4 kg (133 lb 2.5 oz) IBW/kg (Calculated) : 70.7  Temp (24hrs), Avg:98.4 F (36.9 C), Min:97.9 F (36.6 C), Max:98.7 F (37.1 C)  Recent Labs  Lab 06/22/20 1444 06/23/20 0700 06/25/20 0545 06/27/20 0031 06/28/20 0228  WBC 14.5* 13.4* 10.3 9.2 8.6  CREATININE 0.85 0.84 0.69 0.68 0.63    Estimated Creatinine Clearance: 83.9 mL/min (by C-G formula based on SCr of 0.63 mg/dL).    No Known Allergies  Antimicrobials this admission: Vanc 8/12 >>8/18; 8/21>>8/24, 9/13 >> Zosyn 8/3 >>8/18 8/21 Ceftriaxone 7/30>>8/2 Azithromycin 7/30>>8/2 Fluconazole 8/5>>8/24, 9/13>>  Cefepime 8/24> 8/27, 9/10> 9/13 Meropenem 9/13>>   Dose adjustments this admission: 9/16 VT 5 - change regimen to 750 mg IV every 8 hr (estAUC~497)  Microbiology results: 7/29 BCx - negative 7/30 UCx - negative 8/2 pleural fluid - negative 8/4 MRSA PCR - negative 8/5 right chest empyema tissue - candida parap 8/13 empyema: yeast - candida parap - sens to flucon -per ID  8/20 blood x2- neg 8/21 blood x2- neg 8/29 blood>> neg 9/7 bld: ngtd 9/10 bld: ngtd  Thank you for allowing pharmacy to be a part of this patient's care.  Antonietta Jewel, PharmD, BCCCP Clinical Pharmacist  Phone: [phone] 06/28/2020 1:14 PM  Please check AMION for all [place] phone numbers After 10:00 PM,  call [place] [phone]

## 2020-06-28 NOTE — Progress Notes (Signed)
Patient ID: Evan Chavez, male   DOB: 1960-12-06, 59 y.o.   MRN: [id number]          [facility] for Infectious Disease    Date of Admission:  05/10/2020   Total days of antibiotics 7         Mr. Mcdonald is growing MRSA and an anaerobe from his right pleural empyema culture.  He has previously grown Candida.  We will continue his current 3 drug antibiotic regimen         Michel Bickers, MD [facility] for Infectious [place] [phone] pager   [phone] cell 06/28/2020, 3:36 PM

## 2020-06-29 LAB — GLUCOSE, CAPILLARY
Glucose-Capillary: 133 mg/dL — ABNORMAL HIGH (ref 70–99)
Glucose-Capillary: 126 mg/dL — ABNORMAL HIGH (ref 70–99)
Glucose-Capillary: 124 mg/dL — ABNORMAL HIGH (ref 70–99)
Glucose-Capillary: 118 mg/dL — ABNORMAL HIGH (ref 70–99)

## 2020-06-29 LAB — CBC
HCT: 29.2 % — ABNORMAL LOW (ref 39.0–52.0)
Hemoglobin: 8.2 g/dL — ABNORMAL LOW (ref 13.0–17.0)
MCH: 22.5 pg — ABNORMAL LOW (ref 26.0–34.0)
MCHC: 28.1 g/dL — ABNORMAL LOW (ref 30.0–36.0)
MCV: 80.2 fL (ref 80.0–100.0)
Platelets: 475 10*3/uL — ABNORMAL HIGH (ref 150–400)
RBC: 3.64 MIL/uL — ABNORMAL LOW (ref 4.22–5.81)
RDW: 16 % — ABNORMAL HIGH (ref 11.5–15.5)
WBC: 6.4 10*3/uL (ref 4.0–10.5)
nRBC: 0 % (ref 0.0–0.2)

## 2020-06-29 LAB — BASIC METABOLIC PANEL
Anion gap: 6 (ref 5–15)
BUN: 10 mg/dL (ref 6–20)
CO2: 27 mmol/L (ref 22–32)
Calcium: 8.7 mg/dL — ABNORMAL LOW (ref 8.9–10.3)
Chloride: 100 mmol/L (ref 98–111)
Creatinine, Ser: 0.6 mg/dL — ABNORMAL LOW (ref 0.61–1.24)
GFR calc Af Amer: >60 mL/min (ref >60)
GFR calc non Af Amer: >60 mL/min (ref >60)
Glucose, Bld: 142 mg/dL — ABNORMAL HIGH (ref 70–99)
Potassium: 4 mmol/L (ref 3.5–5.1)
Sodium: 133 mmol/L — ABNORMAL LOW (ref 135–145)

## 2020-06-29 MED ORDER — NYSTATIN 100000 UNIT/ML MT SUSP
5.0000 mL | Freq: Four times a day (QID) | OROMUCOSAL | Status: DC
  Administered 2020-06-30 – 2020-07-11 (×16): 500000 [IU] via OROMUCOSAL
  Filled 2020-06-29 (×21): qty 5

## 2020-06-29 MED ORDER — ACETAMINOPHEN 160 MG/5ML PO SOLN: 650.0000 mg | Freq: Four times a day (QID) | ORAL | Status: DC | PRN

## 2020-06-29 MED ORDER — APIXABAN 5 MG PO TABS
5.0000 mg | ORAL_TABLET | Freq: Two times a day (BID) | ORAL | Status: DC
  Administered 2020-06-29 – 2020-07-12 (×27): 5 mg
  Filled 2020-06-29 (×27): qty 1

## 2020-06-29 NOTE — Plan of Care (Signed)
°  Problem: Clinical Measurements: Goal: Ability to maintain clinical measurements within normal limits will improve Outcome: Progressing   Problem: Clinical Measurements: Goal: Will remain free from infection Outcome: Progressing   Problem: Clinical Measurements: Goal: Diagnostic test results will improve Outcome: Progressing   Problem: Activity: Goal: Risk for activity intolerance will decrease Outcome: Progressing   Problem: Nutrition: Goal: Adequate nutrition will be maintained Outcome: Progressing   Problem: Pain Managment: Goal: General experience of comfort will improve Outcome: Progressing   Problem: Safety: Goal: Ability to remain free from injury will improve Outcome: Progressing   Problem: Skin Integrity: Goal: Risk for impaired skin integrity will decrease Outcome: Progressing

## 2020-06-29 NOTE — Progress Notes (Signed)
[facility] PROGRESS NOTE    Evan Chavez  JGG:[id number] DOB: Mar 05, 1961 DOA: 05/10/2020 PCP: Rosita Fire, MD      Brief Narrative:  Evan Chavez is a 59 y.o. M with GERD, Barrett's esophagus, peptic ulcer disease with hiatal hernia, duodenal ulcer, CVA, HLD, BPH remote history of substance abuse and recent pneumonia who presented with recurrent pneumonia and parapneumonic effusion.      7/28 presentation to [facility], ED with pneumonia, discharged from ER 7/30 returned with worsening symptoms, admitted 8/2 right thoracentesis complicated by pneumothorax 8/4 CT chest showed esophageal perforation arising from the right lateral aspect of the mid to distal esophagus and large right-sided hydropneumothorax 8/4 right-sided chest tube placement.  Transferred from [facility] to [facility].  8/5 VATS decortication, EGD, PEG tube placement, esophageal stent placement with a 25 X 125 mm stent by CT surgery. 8/11 repeat esophagogram showed persistent leakage from esophagus to right pleural space. 8/12 developed A. fib with RVR 8/13, patient underwent right thoracoscopy, drainage of loculated pleural effusion, esophagus gastroscopy, esophageal stent placement and esophagogram. 8/16, esophagramdidnot show any esophageal leak. 9/13 IR guided right chest tube placement, cultures growing staph aureus, ID consulted.          Assessment & Plan:  Community-acquired pneumonia Acute respiratory failure with hypoxia POA. Parapneumonic effusion. Hydropneumothorax SP chest tube placement as well as VATS by CT surgery. Empyema. Sepsis POA Patient presented with sepsis physiology, tachycardia, tachypnea, leukocytosis in setting of pneumonia.  This is now resolved.    Due to worsening fever and tachycardia patient had another chest tube placement on 06/25/2020 after initial removal.  Cultures from this September chest tube polymicrobial.  Infectious disease consulted, recommending 3  drug regimen -Continue vancomycin -Continue meropenem -Continue fluconazole   -Consult infectious disease -Consult IR -Consult CT surgery  Esophageal perforation Last CT scan did not show any significant leakage.  Monitor. -Plan for esophageal stent will be removed the week of September 16 and we will evaluate for healing  Oropharyngeal dysphagia. PEG tube placement. On tube feeds. -Continue tube feeds -SLP therapy  Paroxysmal atrial fibrillation Rate controlled -Continue apixaban and metoprolol Echocardiogram showed preserved EF without any wall motion abnormality or valvular abnormality.  Acute on chronic blood loss anemia Hgb stable  Cerebrovascular disease, secondary prevention -Resume Lipitor when LFTs stable  Moderate protein calorie malnutrition Body mass index is 19.66 kg/m.   Left-sided hydronephrosis with ureteral stone Seen initially on presentation. Urology was consulted. Outpatient follow-up with cystoscopy lithotripsy recommended by urology.  Depressions -Continue Prozac          Disposition: Status is: Inpatient  Remains inpatient appropriate because:Inpatient level of care appropriate due to severity of illness   Dispo:  Patient From:    Planned Disposition: [place]  Expected discharge date: 07/16/20  Medically stable for discharge:      The patient was admitted with parapneumonic effusion requiring VATS, etc. severe and complicated subsequent course.  He has an esophageal stent, new chest tube, and is on vancomycin, meropenem, and fluconazole.  He will need a chest tube removed, esophageal stent removed, and determiination of his treatment course by ID.           MDM: The below labs and imaging reports were reviewed and summarized above.  Medication management as above.  DVT prophylaxis: SCD's Start: 05/25/20 1355 Place and maintain sequential compression device Start: 05/23/20 1040 apixaban  (ELIQUIS) tablet 5 mg  Code Status: FULL Family Communication:  Subjective: The patient has no complaints.  No chest pain, no trouble breathing, no confusion, no fever.  Objective: Vitals:   06/29/20 0818 06/29/20 1058 06/29/20 1200 06/29/20 1650  BP: 130/82 120/83 119/77 130/90  Pulse: 91 77 79   Resp:  20 16   Temp:  (!) 97.5 F (36.4 C)  98.5 F (36.9 C)  TempSrc:  Oral  Oral  SpO2:  99% 97%   Weight:      Height:        Intake/Output Summary (Last 24 hours) at 06/29/2020 1754 Last data filed at 06/29/2020 1056 Gross per 24 hour  Intake 1759.35 ml  Output 2255 ml  Net -495.65 ml   Filed Weights   06/27/20 0400 06/28/20 0325 06/29/20 0323  Weight: 60.8 kg 60.4 kg 59.6 kg    Examination: General appearance: Thin adult male, alert and in cute distress.  Sitting up in recliner HEENT: Anicteric, conjunctiva pink, lids and lashes normal. No nasal deformity, discharge, epistaxis.  Lips moist.   Skin: Warm and dry.  No jaundice.  No suspicious rashes or lesions. Cardiac: RRR, nl S1-S2, no murmurs appreciated.  Capillary refill is brisk.  JVP not visible.  No LE edema.  Radial pulses 2+ and symmetric. Respiratory: Normal respiratory rate and rhythm.  CTAB without rales or wheezes.  Diminished on the right. Abdomen: Abdomen soft.  No TTP or guarding. No ascites, distension, hepatosplenomegaly.   MSK: No deformities or effusions. Neuro: Awake and alert.  EOMI, moves all extremities with severe generalized weakness. Speech fluent.    Psych: Sensorium intact and responding to questions, attention normal. Affect blunted.  Judgment and insight appear normal.    Data Reviewed: I have personally reviewed following labs and imaging studies:  CBC: Recent Labs  Lab 06/23/20 0700 06/25/20 0545 06/27/20 0031 06/28/20 0228 06/29/20 0022  WBC 13.4* 10.3 9.2 8.6 6.4  NEUTROABS  --  7.8* 6.5  --   --   HGB 8.4* 8.6* 8.3* 8.3* 8.2*  HCT 28.7* 29.4* 29.0* 29.0* 29.2*   MCV 79.9* 81.0 81.0 80.8 80.2  PLT 421* 463* 476* 509* 779*   Basic Metabolic Panel: Recent Labs  Lab 06/23/20 0700 06/25/20 0545 06/27/20 0031 06/28/20 0228 06/29/20 0022  NA 131* 133* 133* 134* 133*  K 4.3 4.3 4.1 4.6 4.0  CL 96* 99 102 101 100  CO2 25 24 26 26 27   GLUCOSE 143* 107* 139* 142* 142*  BUN 11 9 9 9 10   CREATININE 0.84 0.69 0.68 0.63 0.60*  CALCIUM 8.4* 8.8* 8.6* 8.8* 8.7*   GFR: Estimated Creatinine Clearance: 82.8 mL/min (A) (by C-G formula based on SCr of 0.6 mg/dL (L)). Liver Function Tests: Recent Labs  Lab 06/23/20 0700 06/27/20 0031  AST 45* 38  ALT 158* 124*  ALKPHOS 144* 140*  BILITOT 0.4 0.3  PROT 7.3 7.3  ALBUMIN 2.0* 1.9*   No results for input(s): LIPASE, AMYLASE in the last 168 hours. No results for input(s): AMMONIA in the last 168 hours. Coagulation Profile: No results for input(s): INR, PROTIME in the last 168 hours. Cardiac Enzymes: No results for input(s): CKTOTAL, CKMB, CKMBINDEX, TROPONINI in the last 168 hours. BNP (last 3 results) No results for input(s): PROBNP in the last 8760 hours. HbA1C: No results for input(s): HGBA1C in the last 72 hours. CBG: Recent Labs  Lab 06/28/20 1622 06/28/20 2120 06/29/20 0615 06/29/20 1117 06/29/20 1616  GLUCAP 118* 119* 133* 126* 124*   Lipid Profile: No results for input(s): CHOL,  HDL, LDLCALC, TRIG, CHOLHDL, LDLDIRECT in the last 72 hours. Thyroid Function Tests: No results for input(s): TSH, T4TOTAL, FREET4, T3FREE, THYROIDAB in the last 72 hours. Anemia Panel: No results for input(s): VITAMINB12, FOLATE, FERRITIN, TIBC, IRON, RETICCTPCT in the last 72 hours. Urine analysis:    Component Value Date/Time   COLORURINE YELLOW 06/23/2020 Gray 06/23/2020 1338   LABSPEC 1.019 06/23/2020 1338   PHURINE 6.0 06/23/2020 1338   GLUCOSEU NEGATIVE 06/23/2020 1338   HGBUR NEGATIVE 06/23/2020 1338   BILIRUBINUR NEGATIVE 06/23/2020 1338   BILIRUBINUR NEG 09/22/2018  1527   KETONESUR NEGATIVE 06/23/2020 1338   PROTEINUR NEGATIVE 06/23/2020 1338   UROBILINOGEN 0.2 09/22/2018 1527   UROBILINOGEN 0.2 03/03/2012 0205   NITRITE NEGATIVE 06/23/2020 1338   LEUKOCYTESUR NEGATIVE 06/23/2020 1338   Sepsis Labs: @LABRCNTIP (procalcitonin:4,lacticacidven:4)  ) Recent Results (from the past 240 hour(s))  SARS Coronavirus 2 by RT PCR (hospital order, performed in [place] hospital lab) Nasopharyngeal Nasopharyngeal Swab     Status: None   Collection Time: 06/21/20  4:20 PM   Specimen: Nasopharyngeal Swab  Result Value Ref Range Status   SARS Coronavirus 2 NEGATIVE NEGATIVE Final    Comment: (NOTE) SARS-CoV-2 target nucleic acids are NOT DETECTED.  The SARS-CoV-2 RNA is generally detectable in upper and lower respiratory specimens during the acute phase of infection. The lowest concentration of SARS-CoV-2 viral copies this assay can detect is 250 copies / mL. A negative result does not preclude SARS-CoV-2 infection and should not be used as the sole basis for treatment or other patient management decisions.  A negative result may occur with improper specimen collection / handling, submission of specimen other than nasopharyngeal swab, presence of viral mutation(s) within the areas targeted by this assay, and inadequate number of viral copies (<250 copies / mL). A negative result must be combined with clinical observations, patient history, and epidemiological information.  Fact Sheet for Patients:   StrictlyIdeas.no  Fact Sheet for Healthcare Providers: BankingDealers.co.za  This test is not yet approved or  cleared by the [place] FDA and has been authorized for detection and/or diagnosis of SARS-CoV-2 by FDA under an Emergency Use Authorization (EUA).  This EUA will remain in effect (meaning this test can be used) for the duration of the COVID-19 declaration under Section 564(b)(1) of the Act, 21  U.S.C. section 360bbb-3(b)(1), unless the authorization is terminated or revoked sooner.  Performed at [facility], [place] [address] [place], [place] [address]   Culture, blood (Routine X 2) w Reflex to ID Panel     Status: None   Collection Time: 06/22/20  9:54 AM   Specimen: BLOOD RIGHT HAND  Result Value Ref Range Status   Specimen Description BLOOD RIGHT HAND  Final   Special Requests   Final    BOTTLES DRAWN AEROBIC ONLY Blood Culture adequate volume   Culture   Final    NO GROWTH 5 DAYS Performed at [facility], [address], [place],  [address]    Report Status 06/27/2020 FINAL  Final  Culture, blood (Routine X 2) w Reflex to ID Panel     Status: None   Collection Time: 06/22/20 10:01 AM   Specimen: BLOOD LEFT ARM  Result Value Ref Range Status   Specimen Description BLOOD LEFT ARM  Final   Special Requests   Final    BOTTLES DRAWN AEROBIC AND ANAEROBIC Blood Culture adequate volume   Culture   Final  NO GROWTH 5 DAYS Performed at [facility], [place] [address] [place], [place] [address]    Report Status 06/27/2020 FINAL  Final  MRSA PCR Screening     Status: Abnormal   Collection Time: 06/23/20 12:30 PM   Specimen: Nasopharyngeal  Result Value Ref Range Status   MRSA by PCR POSITIVE (A) NEGATIVE Final    Comment:        The GeneXpert MRSA Assay (FDA approved for NASAL specimens only), is one component of a comprehensive MRSA colonization surveillance program. It is not intended to diagnose MRSA infection nor to guide or monitor treatment for MRSA infections. RESULT CALLED TO, READ BACK BY AND VERIFIED WITH: L. LEE RN, AT [id number] 06/23/20 [place] Performed at [facility], [place] [address], [place], [place] [address]   Aerobic/Anaerobic Culture (surgical/deep wound)     Status: None (Preliminary result)   Collection Time: 06/25/20 10:58 AM   Specimen: Pleura; Abscess  Result Value Ref Range Status   Specimen Description  PLEURAL ABSCESS RIGHT  Final   Special Requests NONE  Final   Gram Stain   Final    ABUNDANT WBC PRESENT, PREDOMINANTLY PMN ABUNDANT GRAM POSITIVE COCCI ABUNDANT GRAM VARIABLE ROD    Culture   Final    ABUNDANT METHICILLIN RESISTANT STAPHYLOCOCCUS AUREUS HOLDING FOR POSSIBLE ANAEROBE Performed at [facility], [place] [address] [place], [place] [address]    Report Status PENDING  Incomplete   Organism ID, Bacteria METHICILLIN RESISTANT STAPHYLOCOCCUS AUREUS  Final      Susceptibility   Methicillin resistant staphylococcus aureus - MIC*    CIPROFLOXACIN >=8 RESISTANT Resistant     ERYTHROMYCIN >=8 RESISTANT Resistant     GENTAMICIN <=0.5 SENSITIVE Sensitive     OXACILLIN >=4 RESISTANT Resistant     TETRACYCLINE <=1 SENSITIVE Sensitive     VANCOMYCIN <=0.5 SENSITIVE Sensitive     TRIMETH/SULFA <=10 SENSITIVE Sensitive     CLINDAMYCIN <=0.25 SENSITIVE Sensitive     RIFAMPIN <=0.5 SENSITIVE Sensitive     Inducible Clindamycin NEGATIVE Sensitive     * ABUNDANT METHICILLIN RESISTANT STAPHYLOCOCCUS AUREUS         Radiology Studies: No results found.      Scheduled Meds: . apixaban  5 mg Per Tube BID  . feeding supplement (PROSource TF)  45 mL Per Tube BID  . FLUoxetine  20 mg Per Tube Daily  . insulin aspart  0-24 Units Subcutaneous TID AC & HS  . mouth rinse  15 mL Mouth Rinse q12n4p  . metoprolol tartrate  100 mg Per Tube BID  . nystatin  5 mL Mouth/Throat QID  . pantoprazole sodium  40 mg Per Tube BID  . sennosides  5 mL Per Tube QHS  . traZODone  50 mg Per Tube QHS   Continuous Infusions: . sodium chloride 1,000 mL (06/06/20 0028)  . sodium chloride    . feeding supplement (OSMOLITE 1.5 CAL) 1,000 mL (06/29/20 0340)  . fluconazole (DIFLUCAN) IV 400 mg (06/29/20 1248)  . meropenem (MERREM) IV 1 g (06/29/20 1651)  . vancomycin 750 mg (06/29/20 1454)     LOS: 49 days    Time spent: 25 minutes    Edwin Dada, MD [facility] 06/29/2020, 5:54 PM     Please page though [place] or Epic secure chat:  For [employer], Adult nurse

## 2020-06-29 NOTE — Progress Notes (Signed)
Patient ID: Evan Chavez, male   DOB: Mar 07, 1961, 59 y.o.   MRN: [id number] Patient ID: Evan Chavez, male   DOB: 07/30/1961, 59 y.o.   MRN: [id number]         [facility] for Infectious Disease  Date of Admission:  05/10/2020   Total days of antibiotics 8        Day 5 vancomycin        Day 5 meropenem        Day 5 fluconazole ASSESSMENT: He has persistent polymicrobial right pleural empyema.  Recent cultures are growing MRSA and an anaerobe.  Past cultures grew Candida parapsilosis.  PLAN: 1. Continue current 3 drug antimicrobial regimen  2. Please call Dr. Tommy Medal [phone]) for any infectious disease questions this weekend  Principal Problem:   Pleural empyema ([place]) Active Problems:   Pneumothorax on right   Esophageal perforation   Essential hypertension   Atrial tachycardia (HCC)   Dyslipidemia   Transaminitis   Gastroesophageal reflux disease with esophagitis   Barrett's esophagus   Thyroid nodule   BPH (benign prostatic hyperplasia)   Ureteral stone with hydronephrosis   Acute respiratory failure with hypoxia (HCC)   Malnutrition of moderate degree   Pressure injury of skin   Atrial fibrillation with RVR (HCC)   Right lower lobe pulmonary infiltrate   Dysphagia   Status post thoracentesis   Scheduled Meds: . apixaban  5 mg Per Tube BID  . feeding supplement (PROSource TF)  45 mL Per Tube BID  . FLUoxetine  20 mg Per Tube Daily  . insulin aspart  0-24 Units Subcutaneous TID AC & HS  . mouth rinse  15 mL Mouth Rinse q12n4p  . metoprolol tartrate  100 mg Per Tube BID  . nystatin  5 mL Mouth/Throat QID  . pantoprazole sodium  40 mg Per Tube BID  . sennosides  5 mL Per Tube QHS  . traZODone  50 mg Per Tube QHS   Continuous Infusions: . sodium chloride 1,000 mL (06/06/20 0028)  . sodium chloride    . feeding supplement (OSMOLITE 1.5 CAL) 1,000 mL (06/29/20 0340)  . fluconazole (DIFLUCAN) IV 400 mg (06/28/20 1808)  . meropenem (MERREM) IV  1 g (06/29/20 0814)  . vancomycin 750 mg (06/29/20 0621)   PRN Meds:.sodium chloride, [CANCELED] Place/Maintain arterial line **AND** sodium chloride, acetaminophen (TYLENOL) oral liquid 160 mg/5 mL, fentaNYL (SUBLIMAZE) injection, guaiFENesin-dextromethorphan, hydrALAZINE, ipratropium-albuterol, metoprolol tartrate, ondansetron (ZOFRAN) IV, oxyCODONE, phenol, traMADol   SUBJECTIVE: He simply nods "yes" that he is feeling better today.  Review of Systems: Review of Systems  Unable to perform ROS: Critical illness    No Known Allergies  OBJECTIVE: Vitals:   06/28/20 2206 06/28/20 2308 06/29/20 0323 06/29/20 0818  BP: (!) 139/94 124/84 (!) 125/92 130/82  Pulse: 88 81 81 91  Resp:  (!) 24 15   Temp:  97.7 F (36.5 C) 98.2 F (36.8 C)   TempSrc:  Oral Oral   SpO2:  100% 96%   Weight:   59.6 kg   Height:       Body mass index is 19.4 kg/m.  Physical Exam Constitutional:      Comments: He is sitting up in a chair today.  He seems to be in better spirits.  Cardiovascular:     Rate and Rhythm: Normal rate and regular rhythm.     Heart sounds: No murmur heard.   Pulmonary:     Effort: Pulmonary effort is normal.  Comments: Shallow respirations.  30 cc of chest tube output recorded yesterday.    Lab Results Lab Results  Component Value Date   WBC 6.4 06/29/2020   HGB 8.2 (L) 06/29/2020   HCT 29.2 (L) 06/29/2020   MCV 80.2 06/29/2020   PLT 475 (H) 06/29/2020    Lab Results  Component Value Date   CREATININE 0.60 (L) 06/29/2020   BUN 10 06/29/2020   NA 133 (L) 06/29/2020   K 4.0 06/29/2020   CL 100 06/29/2020   CO2 27 06/29/2020    Lab Results  Component Value Date   ALT 124 (H) 06/27/2020   AST 38 06/27/2020   ALKPHOS 140 (H) 06/27/2020   BILITOT 0.3 06/27/2020     Microbiology: Recent Results (from the past 240 hour(s))  Culture, blood (routine x 2)     Status: None   Collection Time: 06/19/20  2:30 PM   Specimen: BLOOD LEFT ARM  Result Value Ref  Range Status   Specimen Description BLOOD LEFT ARM  Final   Special Requests   Final    BOTTLES DRAWN AEROBIC AND ANAEROBIC Blood Culture adequate volume   Culture   Final    NO GROWTH 5 DAYS Performed at [facility], [address] [place], [place] [address]    Report Status 06/24/2020 FINAL  Final  Culture, blood (routine x 2)     Status: None   Collection Time: 06/19/20  2:35 PM   Specimen: BLOOD RIGHT HAND  Result Value Ref Range Status   Specimen Description BLOOD RIGHT HAND  Final   Special Requests   Final    BOTTLES DRAWN AEROBIC AND ANAEROBIC Blood Culture adequate volume   Culture   Final    NO GROWTH 5 DAYS Performed at [facility], [place] [address] [place], [place] [address]    Report Status 06/24/2020 FINAL  Final  SARS Coronavirus 2 by RT PCR (hospital order, performed in [facility] hospital lab) Nasopharyngeal Nasopharyngeal Swab     Status: None   Collection Time: 06/21/20  4:20 PM   Specimen: Nasopharyngeal Swab  Result Value Ref Range Status   SARS Coronavirus 2 NEGATIVE NEGATIVE Final    Comment: (NOTE) SARS-CoV-2 target nucleic acids are NOT DETECTED.  The SARS-CoV-2 RNA is generally detectable in upper and lower respiratory specimens during the acute phase of infection. The lowest concentration of SARS-CoV-2 viral copies this assay can detect is 250 copies / mL. A negative result does not preclude SARS-CoV-2 infection and should not be used as the sole basis for treatment or other patient management decisions.  A negative result may occur with improper specimen collection / handling, submission of specimen other than nasopharyngeal swab, presence of viral mutation(s) within the areas targeted by this assay, and inadequate number of viral copies (<250 copies / mL). A negative result must be combined with clinical observations, patient history, and epidemiological information.  Fact Sheet for Patients:     StrictlyIdeas.no  Fact Sheet for Healthcare Providers: BankingDealers.co.za  This test is not yet approved or  cleared by the [place] FDA and has been authorized for detection and/or diagnosis of SARS-CoV-2 by FDA under an Emergency Use Authorization (EUA).  This EUA will remain in effect (meaning this test can be used) for the duration of the COVID-19 declaration under Section 564(b)(1) of the Act, 21 U.S.C. section 360bbb-3(b)(1), unless the authorization is terminated or revoked sooner.  Performed at [facility], [place]  [address], [place], [place] [address]   Culture, blood (Routine X 2) w Reflex to ID Panel     Status: None   Collection Time: 06/22/20  9:54 AM   Specimen: BLOOD RIGHT HAND  Result Value Ref Range Status   Specimen Description BLOOD RIGHT HAND  Final   Special Requests   Final    BOTTLES DRAWN AEROBIC ONLY Blood Culture adequate volume   Culture   Final    NO GROWTH 5 DAYS Performed at [facility], [address], [place], [place] [address]    Report Status 06/27/2020 FINAL  Final  Culture, blood (Routine X 2) w Reflex to ID Panel     Status: None   Collection Time: 06/22/20 10:01 AM   Specimen: BLOOD LEFT ARM  Result Value Ref Range Status   Specimen Description BLOOD LEFT ARM  Final   Special Requests   Final    BOTTLES DRAWN AEROBIC AND ANAEROBIC Blood Culture adequate volume   Culture   Final    NO GROWTH 5 DAYS Performed at [facility], [place] [address] [place], [place] [address]    Report Status 06/27/2020 FINAL  Final  MRSA PCR Screening     Status: Abnormal   Collection Time: 06/23/20 12:30 PM   Specimen: Nasopharyngeal  Result Value Ref Range Status   MRSA by PCR POSITIVE (A) NEGATIVE Final    Comment:        The GeneXpert MRSA Assay (FDA approved for NASAL specimens only), is one component of a comprehensive MRSA colonization surveillance program. It is not intended  to diagnose MRSA infection nor to guide or monitor treatment for MRSA infections. RESULT CALLED TO, READ BACK BY AND VERIFIED WITH: L. LEE RN, AT [id number] 06/23/20 [place] Performed at [facility], [place] [address], [place], [place] [address]   Aerobic/Anaerobic Culture (surgical/deep wound)     Status: None (Preliminary result)   Collection Time: 06/25/20 10:58 AM   Specimen: Pleura; Abscess  Result Value Ref Range Status   Specimen Description PLEURAL ABSCESS RIGHT  Final   Special Requests NONE  Final   Gram Stain   Final    ABUNDANT WBC PRESENT, PREDOMINANTLY PMN ABUNDANT GRAM POSITIVE COCCI ABUNDANT GRAM VARIABLE ROD    Culture   Final    ABUNDANT METHICILLIN RESISTANT STAPHYLOCOCCUS AUREUS HOLDING FOR POSSIBLE ANAEROBE Performed at [facility], [place] [address], [place], [place] [address]    Report Status PENDING  Incomplete   Organism ID, Bacteria METHICILLIN RESISTANT STAPHYLOCOCCUS AUREUS  Final      Susceptibility   Methicillin resistant staphylococcus aureus - MIC*    CIPROFLOXACIN >=8 RESISTANT Resistant     ERYTHROMYCIN >=8 RESISTANT Resistant     GENTAMICIN <=0.5 SENSITIVE Sensitive     OXACILLIN >=4 RESISTANT Resistant     TETRACYCLINE <=1 SENSITIVE Sensitive     VANCOMYCIN <=0.5 SENSITIVE Sensitive     TRIMETH/SULFA <=10 SENSITIVE Sensitive     CLINDAMYCIN <=0.25 SENSITIVE Sensitive     RIFAMPIN <=0.5 SENSITIVE Sensitive     Inducible Clindamycin NEGATIVE Sensitive     * ABUNDANT METHICILLIN RESISTANT STAPHYLOCOCCUS AUREUS    Michel Bickers, MD [facility] [phone] pager   [phone] cell 06/29/2020, 10:29 AM

## 2020-06-29 NOTE — Progress Notes (Signed)
Referring Physician(s): Lightfoot,H  Supervising Physician: Dr. Pascal Lux  Patient Status:  [facility] - In-pt  Chief Complaint:  Cough, right chest empyema  Subjective: Pt doing ok this am; denies worsening CP/dyspnea, only nods head to questions. Haven't seen any recent input from CTS.   Allergies: Patient has no known allergies.  Medications:  Current Facility-Administered Medications:  .  0.9 %  sodium chloride infusion, , Intravenous, PRN, Elgie Collard, PA-C, Last Rate: 10 mL/hr at 06/06/20 0028, 1,000 mL at 06/06/20 0028 .  [CANCELED] Place/Maintain arterial line, , , Until Discontinued **AND** 0.9 %  sodium chloride infusion, , Intra-arterial, PRN, Conte, Tessa N, PA-C .  acetaminophen (TYLENOL) 160 MG/5ML solution 650 mg, 650 mg, Per Tube, Q6H PRN, [place], Suann Larry, MD .  apixaban (ELIQUIS) tablet 5 mg, 5 mg, Per Tube, BID, [place], Suann Larry, MD, 5 mg at 06/29/20 0831 .  feeding supplement (OSMOLITE 1.5 CAL) liquid 1,000 mL, 1,000 mL, Per Tube, Continuous, Oretha Milch D, MD, Last Rate: 60 mL/hr at 06/29/20 0340, 1,000 mL at 06/29/20 0340 .  feeding supplement (PROSource TF) liquid 45 mL, 45 mL, Per Tube, BID, Lightfoot, Harrell O, MD, 45 mL at 06/29/20 0819 .  fentaNYL (SUBLIMAZE) injection 25 mcg, 25 mcg, Intravenous, Q2H PRN, Elgie Collard, PA-C, 25 mcg at 06/01/20 2132 .  fluconazole (DIFLUCAN) IVPB 400 mg, 400 mg, Intravenous, Q24H, Akula, Vijaya, MD, Last Rate: 100 mL/hr at 06/28/20 1808, 400 mg at 06/28/20 1808 .  FLUoxetine (PROZAC) capsule 20 mg, 20 mg, Per Tube, Daily, Mariel Aloe, MD, 20 mg at 06/29/20 0817 .  guaiFENesin-dextromethorphan (ROBITUSSIN DM) 100-10 MG/5ML syrup 10 mL, 10 mL, Per Tube, Q4H PRN, Hosie Poisson, MD .  hydrALAZINE (APRESOLINE) injection 10 mg, 10 mg, Intravenous, Q4H PRN, Conte, Tessa N, PA-C .  CBG monitoring, , , 4x Daily, AC & HS **AND** insulin aspart (novoLOG) injection 0-24 Units, 0-24 Units, Subcutaneous, TID AC & HS,  Elgie Collard, PA-C, 2 Units at 06/29/20 0815 .  ipratropium-albuterol (DUONEB) 0.5-2.5 (3) MG/3ML nebulizer solution 3 mL, 3 mL, Nebulization, Q4H PRN, Harriet Pho, Tessa N, PA-C, 3 mL at 05/31/20 0925 .  MEDLINE mouth rinse, 15 mL, Mouth Rinse, q12n4p, Conte, Tessa N, PA-C, 15 mL at 06/28/20 1825 .  meropenem (MERREM) 1 g in sodium chloride 0.9 % 100 mL IVPB, 1 g, Intravenous, Q8H, Akula, Vijaya, MD, Last Rate: 200 mL/hr at 06/29/20 0814, 1 g at 06/29/20 0814 .  metoprolol tartrate (LOPRESSOR) 25 mg/10 mL oral suspension 100 mg, 100 mg, Per Tube, BID, Hosie Poisson, MD, 100 mg at 06/29/20 0818 .  metoprolol tartrate (LOPRESSOR) injection 5 mg, 5 mg, Intravenous, Q4H PRN, Elgie Collard, PA-C, 5 mg at 06/26/20 0949 .  nystatin (MYCOSTATIN) 100000 UNIT/ML suspension 500,000 Units, 5 mL, Mouth/Throat, QID, [place], Suann Larry, MD .  ondansetron (ZOFRAN) injection 4 mg, 4 mg, Intravenous, Q6H PRN, Conte, Tessa N, PA-C, 4 mg at 06/22/20 1745 .  oxyCODONE (ROXICODONE) 5 MG/5ML solution 5 mg, 5 mg, Per Tube, Q6H PRN, Harriet Pho, Tessa N, PA-C, 5 mg at 06/27/20 1142 .  pantoprazole sodium (PROTONIX) 40 mg/20 mL oral suspension 40 mg, 40 mg, Per Tube, BID, Georgette Shell, MD, 40 mg at 06/29/20 0819 .  phenol (CHLORASEPTIC) mouth spray 1 spray, 1 spray, Mouth/Throat, PRN, Lightfoot, Lucile Crater, MD, 1 spray at 06/21/20 1650 .  sennosides (SENOKOT) 8.8 MG/5ML syrup 5 mL, 5 mL, Per Tube, QHS, Conte, Tessa N, PA-C, 5 mL at 06/28/20  2206 .  traMADol (ULTRAM) tablet 25 mg, 25 mg, Per Tube, Q6H PRN, Lajuana Matte, MD, 25 mg at 06/10/20 1847 .  traZODone (DESYREL) tablet 50 mg, 50 mg, Per Tube, QHS, Mariel Aloe, MD, 50 mg at 06/28/20 2206 .  vancomycin (VANCOREADY) IVPB 750 mg/150 mL, 750 mg, Intravenous, Q8H, Lavina Hamman, MD, Last Rate: 150 mL/hr at 06/29/20 0621, 750 mg at 06/29/20 2706    Vital Signs: BP 120/83 (BP Location: Right Arm)   Pulse 77   Temp (!) 97.5 F (36.4 C) (Oral)   Resp 20    Ht 5\' 9"  (1.753 m)   Wt 59.6 kg   SpO2 99%   BMI 19.40 kg/m   Physical Exam awake; rt chest tube intact, insertion site covered with intact, clean gauze dressing, sl tender to palpation, about 350 cc total cream colored fluid in pleuravac, no obvious air leak; tube to wall suction  Imaging: DG Chest Port 1 View  Result Date: 06/27/2020 CLINICAL DATA:  Empyema of right pleural space. EXAM: PORTABLE CHEST 1 VIEW COMPARISON:  June 22, 2020. FINDINGS: Stable cardiomediastinal silhouette. Esophageal stent is unchanged in position. There is interval placement of pleural drainage catheter in the right lung base. Right pleural effusion noted on prior exam is slightly decreased in size. No pneumothorax is noted. Left lung is clear. Bony thorax is unremarkable. IMPRESSION: Interval placement of pleural drainage catheter in right lung base. Right pleural effusion noted on prior exam is slightly decreased in size. Electronically Signed   By: Marijo Conception M.D.   On: 06/27/2020 12:25    Labs:  CBC: Recent Labs    06/25/20 0545 06/27/20 0031 06/28/20 0228 06/29/20 0022  WBC 10.3 9.2 8.6 6.4  HGB 8.6* 8.3* 8.3* 8.2*  HCT 29.4* 29.0* 29.0* 29.2*  PLT 463* 476* 509* 475*    COAGS: Recent Labs    05/10/20 2111 05/11/20 0536 06/02/20 1240 06/02/20 1524  INR 1.1 1.1 1.2 1.2  APTT 30 31  --  35    BMP: Recent Labs    06/25/20 0545 06/27/20 0031 06/28/20 0228 06/29/20 0022  NA 133* 133* 134* 133*  K 4.3 4.1 4.6 4.0  CL 99 102 101 100  CO2 24 26 26 27   GLUCOSE 107* 139* 142* 142*  BUN 9 9 9 10   CALCIUM 8.8* 8.6* 8.8* 8.7*  CREATININE 0.69 0.68 0.63 0.60*  GFRNONAA >60 >60 >60 >60  GFRAA >60 >60 >60 >60    LIVER FUNCTION TESTS: Recent Labs    06/17/20 0951 06/20/20 0801 06/23/20 0700 06/27/20 0031  BILITOT 0.5 0.4 0.4 0.3  AST 55* 61* 45* 38  ALT 154* 198* 158* 124*  ALKPHOS 142* 145* 144* 140*  PROT 7.5 7.4 7.3 7.3  ALBUMIN 2.1* 2.2* 2.0* 1.9*    Assessment  and Plan: Pt with hx rt chest empyema, prior esoph rupture with stenting, prior CVA; s/p rt chest drain placement 9/13 Monitor drain OP-once minimal obtain f/u CT to reassess for improvement in empyema drainage;  other plans as per TCTS/TRH/ID   Electronically Signed: Ascencion Dike, PA-C 06/29/2020, 11:33 AM   I spent a total of 15 minutes at the the patient's bedside AND on the patient's hospital floor or unit, greater than 50% of which was counseling/coordinating care for right chest drain

## 2020-06-30 LAB — GLUCOSE, CAPILLARY
Glucose-Capillary: 111 mg/dL — ABNORMAL HIGH (ref 70–99)
Glucose-Capillary: 137 mg/dL — ABNORMAL HIGH (ref 70–99)
Glucose-Capillary: 115 mg/dL — ABNORMAL HIGH (ref 70–99)
Glucose-Capillary: 108 mg/dL — ABNORMAL HIGH (ref 70–99)

## 2020-06-30 NOTE — Progress Notes (Signed)
PROGRESS NOTE    Evan Chavez  PNT:[id number] DOB: 1961/02/07 DOA: 05/10/2020 PCP: Rosita Fire, MD   Brief Narrative:  Evan Chavez is a 59 y.o. M with GERD, Barrett's esophagus, peptic ulcer disease with hiatal hernia, duodenal ulcer, CVA, HLD, BPH remote history of substance abuse and recent pneumonia who presented with recurrent pneumonia and parapneumonic effusion.   7/28 presentation to [facility], ED with pneumonia, discharged from ER 7/30 returned with worsening symptoms, admitted 8/2 right thoracentesiscomplicated by pneumothorax 8/4 CT chest showed esophageal perforation arising from the right lateral aspect of the mid to distal esophagus and large right-sided hydropneumothorax 8/4 right-sided chest tube placement.Transferred from [facility].  8/5 VATS decortication, EGD, PEG tube placement, esophageal stent placement with a 25 X 125 mm stent by CT surgery. 8/11 repeat esophagogram showed persistent leakage from esophagus to right pleural space. 8/12 developed A. fib with RVR 8/13, patient underwent right thoracoscopy, drainage of loculated pleural effusion, esophagus gastroscopy, esophageal stent placement and esophagogram. 8/16, esophagramdidnot show any esophageal leak. 9/13 IR guided right chest tube placement, cultures growing staph aureus,ID consulted   Assessment & Plan:   Principal Problem:   Pleural empyema ([place]) Active Problems:   Essential hypertension   Atrial tachycardia (HCC)   Dyslipidemia   Transaminitis   Gastroesophageal reflux disease with esophagitis   Barrett's esophagus   Thyroid nodule   BPH (benign prostatic hyperplasia)   Ureteral stone with hydronephrosis   Pneumothorax on right   Acute respiratory failure with hypoxia (HCC)   Malnutrition of moderate degree   Esophageal perforation   Pressure injury of skin   Atrial fibrillation with RVR (HCC)   Right lower lobe pulmonary infiltrate   Dysphagia   Status post  thoracentesis   Community-acquired pneumonia Acute respiratory failure with hypoxia POA. Parapneumonic effusion. Hydropneumothorax SP chest tube placement as well as VATS by CT surgery. Empyema. Sepsis POA Patient presented with sepsis physiology, tachycardia, tachypnea, leukocytosis in setting of pneumonia.  This is now resolved.    Due to worsening fever and tachycardia patient had another chest tube placement on 06/25/2020 after initial removal.  Cultures from this September chest tube polymicrobial.  Infectious disease consulted, recommending 3 drug regimen -Continue vancomycin -Continue meropenem -Continue fluconazole   -Consult infectious disease -Consult IR -Consult CT surgery  Esophageal perforation Last CT scan did not show any significant leakage. Monitor. -Plan for esophageal stentwill be removed the week of September 16 and we will evaluate for healing  Oropharyngeal dysphagia. PEG tube placement. On tube feeds. -Continue tube feeds -SLP therapy  Paroxysmal atrial fibrillation Rate controlled -Continue apixaban and metoprolol Echocardiogram showed preserved EF without any wall motion abnormality or valvular abnormality.  Acute on chronic blood loss anemia Hgb stable  Cerebrovascular disease, secondary prevention -Resume Lipitor when LFTs stable  Moderate protein calorie malnutrition Body mass index is 19.66 kg/m.  Left-sided hydronephrosis with ureteral stone Seen initially on presentation. Urology was consulted. Outpatient follow-up with cystoscopy lithotripsy recommended by urology.  Depressions -Continue Prozac  DVT prophylaxis: [id number]  Code Status: FULL    Code Status Orders  (From admission, onward)         Start     Ordered   05/11/20 0134  Full code  Continuous        05/11/20 0138        Code Status History    Date Active Date Inactive Code Status Order ID Comments User Context   08/21/2018 2357 08/26/2018  2011 Full Code [id number]  Reubin Milan, MD ED   07/12/2018 1945 07/15/2018 2239 Full Code [id number]  Orson Eva, MD ED   Advance Care Planning Activity     Family Communication: called mother [phone] no answer no vmail box Disposition Plan: Status is: Inpatient  Remains inpatient appropriate because:Inpatient level of care appropriate due to severity of illness   Dispo:             Patient From:               Planned Disposition: [place]             Expected discharge date: 07/16/20             Medically stable for discharge:      The patient was admitted with parapneumonic effusion requiring VATS, etc. severe and complicated subsequent course.  He has an esophageal stent, new chest tube, and is on vancomycin, meropenem, and fluconazole.  He will need a chest tube removed, esophageal stent removed, and determiination of his treatment course by ID.     Consults called: none today Admission status: Inpatient   Consultants:   ID,   Procedures:  DG Chest 1 View  Result Date: 06/10/2020 CLINICAL DATA:  Fever and shortness of breath. EXAM: CHEST  1 VIEW COMPARISON:  June 04, 2020 FINDINGS: Stable esophageal stent. Opacity and effusion in the right base remains, more focal medially on this study. The cardiomediastinal silhouette is stable. No pneumothorax. The left lung is clear. No other acute abnormalities. IMPRESSION: 1. Stable esophageal stent. 2. The opacity in the right base is more focal medially. There is some volume loss suggesting a component of atelectasis. Likely associated effusion. Electronically Signed   By: Dorise Bullion III M.D   On: 06/10/2020 15:46   DG Chest 2 View  Result Date: 06/22/2020 CLINICAL DATA:  Fevers, history of esophageal stent placement EXAM: CHEST - 2 VIEW COMPARISON:  06/18/2020, CT from 06/19/2020 FINDINGS: Cardiac shadow is mildly prominent but stable. Mid to distal esophageal stent is again seen and stable.  The left lung remains clear. Right lung demonstrates small pleural effusion as well as right lower lobe consolidation. The overall appearance is similar to that seen on the prior exam. A portion of this is shown to represent loculated fluid on recent CT. No bony abnormality is noted. Gastrostomy catheter is noted in the upper abdomen. IMPRESSION: Persistent right-sided effusion and opacity a portion of which is related to loculated fluid. The overall appearance is similar to that seen on prior exam. Electronically Signed   By: Inez Catalina M.D.   On: 06/22/2020 09:35   CT CHEST W CONTRAST  Result Date: 06/19/2020 CLINICAL DATA:  Pneumonia.  Effusion or abscess suspected. EXAM: CT CHEST WITH CONTRAST TECHNIQUE: Multidetector CT imaging of the chest was performed during intravenous contrast administration. CONTRAST:  42mL OMNIPAQUE IOHEXOL 300 MG/ML  SOLN COMPARISON:  Chest radiographs 06/18/2020, CT chest 06/02/2020 FINDINGS: Cardiovascular: The heart size is upper limits of normal, similar to prior. Coronary artery calcifications. No evidence of pericardial effusion or aortic aneurysm. Mediastinum/Nodes: Redemonstrated esophageal stent with debris/fluid layering within the esophagus. There is stranding of the posterior mediastinal fat adjacent to the esophageal stent. No discrete fluid collection identified within the mediastinum. Previously described the described gas along the right aspect of the stent has resolved. There is a small amount of air adjacent to the left aspect of the stent distally and along the right proximal aspect of the  stent. Mildly prominent mediastinal and right hilar lymph nodes, most likely reactive. Lungs/Pleura: Interval increase in a posteromedial loculated right pleural collection, which now measures up to 7.8 by 5.0 cm. The collection has a few punctate areas of internal gas surrounding pleura is enhancing. Overlying linear opacities, compatible with compressive atelectasis.  Otherwise, the lungs are clear. Upper Abdomen: Probable moderate left hydronephrosis, partially imaged but grossly similar to prior stress CT. Musculoskeletal: No chest wall abnormality. No acute or significant osseous findings. IMPRESSION: 1. Interval increase in a loculated right posteromedial pleural fluid collection with surrounding enhancing pleura, which is sterility indeterminate by imaging but possibly an empyema. 2. Redemonstrated esophageal stent with layering debris/fluid. Stranding in the surrounding posterior mediastinum could be postsurgical or relate to persistent esophageal leak and/or mediastinitis. No discrete drainable mediastinal fluid collection. 3. Similar small right pleural effusion with overlying compressive atelectasis. Electronically Signed   By: Margaretha Sheffield MD   On: 06/19/2020 10:31   CT CHEST W CONTRAST  Result Date: 06/02/2020 CLINICAL DATA:  59 year old male with recent esophageal perforation with esophageal stent placement and VATS for RIGHT empyema. EXAM: CT CHEST WITH CONTRAST TECHNIQUE: Multidetector CT imaging of the chest was performed during intravenous contrast administration. CONTRAST:  85mL OMNIPAQUE IOHEXOL 300 MG/ML  SOLN COMPARISON:  05/24/2020 CT FINDINGS: Cardiovascular: UPPER limits normal heart size again noted. Heavy coronary artery calcifications are again identified. There is no evidence of thoracic aortic aneurysm or pericardial effusion. Mediastinum/Nodes: Esophageal stent is again noted. A small amount of paraesophageal gas is again noted and connects to the posteromedial loculated RIGHT pleural collection which measures 2.8 x 7.7 cm in greatest diameter, previously 4.2 x 7.7 cm. No new findings are noted. Mildly prominent mediastinal and RIGHT hilar lymph nodes are likely reactive. Lungs/Pleura: A RIGHT thoracostomy tube courses through the 2.8 x 7.7 cm posteromedial loculated RIGHT pleural collection with tip located 8 cm SUPERIOR to the collection. A  small RIGHT pleural effusion is noted. Moderate RIGHT LOWER lung atelectasis is present. There is no evidence of pneumothorax. Upper Abdomen: Probable moderate LEFT hydronephrosis again noted. Musculoskeletal: No acute or suspicious bony abnormalities are present. IMPRESSION: 1. Slightly decreased size of posteromedial RIGHT pleural collection/empyema from 05/24/2020. The RIGHT thoracostomy tube courses through this collection with tip located 8 cm superiorly. 2. Small RIGHT pleural effusion and moderate RIGHT LOWER lung atelectasis. 3. Esophageal stent again noted. 4. Probable moderate LEFT hydronephrosis. Consider further evaluation with ultrasound or CT as clinically indicated. 5. Coronary artery disease andaortic Atherosclerosis (ICD10-I70.0). Electronically Signed   By: Margarette Canada M.D.   On: 06/02/2020 14:41   CT ABDOMEN PELVIS W CONTRAST  Result Date: 06/22/2020 CLINICAL DATA:  Nausea and vomiting, vomited tube feedings EXAM: CT ABDOMEN AND PELVIS WITH CONTRAST TECHNIQUE: Multidetector CT imaging of the abdomen and pelvis was performed using the standard protocol following bolus administration of intravenous contrast. CONTRAST:  120mL OMNIPAQUE IOHEXOL 300 MG/ML  SOLN COMPARISON:  06/19/2020, 05/13/2020 FINDINGS: Lower chest: Persistent complex loculated right pleural fluid collection, suspicious for empyema/abscess given history of esophageal perforation. Trace free-flowing right pleural fluid. Significant consolidation of the right lower lobe consistent with atelectasis. Esophageal stent identified, with fluid and debris within the esophageal lumen. Hepatobiliary: No focal liver abnormality is seen. No gallstones, gallbladder wall thickening, or biliary dilatation. Pancreas: Unremarkable. No pancreatic ductal dilatation or surrounding inflammatory changes. Spleen: Normal in size without focal abnormality. Adrenals/Urinary Tract: Persistent left-sided hydronephrosis related to an obstructing 11 mm  proximal left ureteral calculus. Multiple  punctate nonobstructing left renal calculi again identified unchanged. Punctate nonobstructing right renal calculi are identified. Right kidney enhances normally. No right-sided obstruction. Bladder is unremarkable.  The adrenals are normal. Stomach/Bowel: Percutaneous gastrostomy tube is identified. No bowel obstruction or ileus. No bowel wall thickening or inflammatory change. Vascular/Lymphatic: Aortic atherosclerosis. No enlarged abdominal or pelvic lymph nodes. Reproductive: Prostate is unremarkable. Other: No free fluid or free intraperitoneal gas. No abdominal wall hernia. Musculoskeletal: No acute or destructive bony lesions. Reconstructed images demonstrate no additional findings. IMPRESSION: 1. Persistent complex loculated right pleural fluid collection, concerning for abscess. Persistent esophageal perforation cannot be excluded in light of previous findings. 2. Stable esophageal stent, with debris and fluid in the esophageal lumen as before. 3. Persistent left-sided hydronephrosis related to an 11 mm obstructing proximal left ureteral calculus. 4. Other bilateral nonobstructing renal calculi 5.  Aortic Atherosclerosis (ICD10-I70.0). Electronically Signed   By: Randa Ngo M.D.   On: 06/22/2020 19:38   DG Chest Port 1 View  Result Date: 06/27/2020 CLINICAL DATA:  Empyema of right pleural space. EXAM: PORTABLE CHEST 1 VIEW COMPARISON:  June 22, 2020. FINDINGS: Stable cardiomediastinal silhouette. Esophageal stent is unchanged in position. There is interval placement of pleural drainage catheter in the right lung base. Right pleural effusion noted on prior exam is slightly decreased in size. No pneumothorax is noted. Left lung is clear. Bony thorax is unremarkable. IMPRESSION: Interval placement of pleural drainage catheter in right lung base. Right pleural effusion noted on prior exam is slightly decreased in size. Electronically Signed   By: Marijo Conception M.D.   On: 06/27/2020 12:25   DG CHEST PORT 1 VIEW  Result Date: 06/18/2020 CLINICAL DATA:  Rhonchi EXAM: PORTABLE CHEST 1 VIEW COMPARISON:  Eight days ago FINDINGS: Increased hazy density at the right base where there is a pleural effusion and atelectasis by recent CT. Stable positioning of the esophageal stent. Normal heart size and aortic contours. No visible pneumothorax. IMPRESSION: Right pleural effusion and lower lobe collapse with mildly increased opacity since 06/10/2020. Electronically Signed   By: Monte Fantasia M.D.   On: 06/18/2020 09:47   DG CHEST PORT 1 VIEW  Result Date: 06/04/2020 CLINICAL DATA:  Pneumonia EXAM: PORTABLE CHEST 1 VIEW COMPARISON:  Chest radiograph and chest CT June 02, 2020 FINDINGS: Esophageal stent again noted, unchanged in position. Chest tube present on the right. No evident pneumothorax. There is a small right pleural effusion with airspace opacity in the right mid and lower lung regions. Left lung is clear. Heart is borderline enlarged with pulmonary vascularity normal. No adenopathy. No bone lesions. IMPRESSION: Persistent small right pleural effusion with airspace opacity, likely combination of atelectasis and pneumonia, in the right mid and lower lung regions. Left lung clear. Stable cardiac silhouette. Stable positioning of esophageal stent. Right chest tube present without evident pneumothorax. Electronically Signed   By: Lowella Grip III M.D.   On: 06/04/2020 08:13   DG CHEST PORT 1 VIEW  Result Date: 06/02/2020 CLINICAL DATA:  Tachypnea EXAM: PORTABLE CHEST 1 VIEW COMPARISON:  June 01, 2020 FINDINGS: An esophageal stent is again identified. Stable right chest tube. No pneumothorax. Opacity and effusion remain in the right base. No other interval changes. IMPRESSION: 1. Small right effusion underlying opacity. The opacity could represent atelectasis or infiltrate. No other changes. Electronically Signed   By: Dorise Bullion III M.D   On:  06/02/2020 12:41   DG CHEST PORT 1 VIEW  Result Date: 06/01/2020 CLINICAL DATA:  Chest tube. Shortness of breath. EXAM: PORTABLE CHEST 1 VIEW COMPARISON:  May 29, 2020 FINDINGS: Since the prior study the right apical chest tube seen on the prior study has been removed. The additional right-sided chest tube seen on the prior exam is stable in position. Persistent moderate to marked severity right basilar atelectasis and/or infiltrate is noted. There is a small right pleural effusion. A small amount of right apical pleural fluid is also suspected. No residual pneumothorax is identified. The cardiac silhouette is mildly enlarged. A radiopaque stent is seen overlying the mid and distal esophagus. The visualized skeletal structures are unremarkable. IMPRESSION: 1. Interval right apical chest tube removal without evidence of a residual pneumothorax. 2. Persistent moderate to marked severity right basilar atelectasis and/or infiltrate. 3. Small right pleural effusion. Electronically Signed   By: Virgina Norfolk M.D.   On: 06/01/2020 16:11   CT IMAGE GUIDED DRAINAGE BY PERCUTANEOUS CATHETER  Result Date: 06/25/2020 INDICATION: 59 year old male with left-sided empyema versus lung abscess EXAM: CT GUIDED DRAINAGE OF  ABSCESS MEDICATIONS: The patient is currently admitted to the hospital and receiving intravenous antibiotics. The antibiotics were administered within an appropriate time frame prior to the initiation of the procedure. ANESTHESIA/SEDATION: 0.5 mg IV Versed 25 mcg IV Fentanyl Moderate Sedation Time:  13 minutes The patient was continuously monitored during the procedure by the interventional radiology nurse under my direct supervision. COMPLICATIONS: None TECHNIQUE: Informed written consent was obtained from the patient after a thorough discussion of the procedural risks, benefits and alternatives. All questions were addressed. Maximal Sterile Barrier Technique was utilized including caps, mask,  sterile gowns, sterile gloves, sterile drape, hand hygiene and skin antiseptic. A timeout was performed prior to the initiation of the procedure. PROCEDURE: The operative field was prepped with Chlorhexidine in a sterile fashion, and a sterile drape was applied covering the operative field. A sterile gown and sterile gloves were used for the procedure. Local anesthesia was provided with 1% Lidocaine. Scout CT was acquired. Patient was prepped and draped in the usual sterile fashion. 1% lidocaine was used for local anesthesia. Yueh needle was passed into the fluid collection of the right chest confirming the needle tip position with aspiration of purulent fluid. Using modified Seldinger technique, a 12 French drain was placed and attached to evacuation chamber. Sample was sent for culture Drain was sutured in position.  Sterile bandage was placed. Patient tolerated the procedure well and remained hemodynamically stable throughout. No complications were encountered and no significant blood loss. FINDINGS: Left chest fluid collection, either empyema or lung abscess. IMPRESSION: Status post CT-guided drainage of right chest abscess/empyema. Signed, Dulcy Fanny. Dellia Nims, RPVI [facility] Radiology Electronically Signed   By: Corrie Mckusick D.O.   On: 06/25/2020 13:06     Subjective: NO ACUTE CHANGES OVERNIGHT, STILL WEAK BUT RESPONSIVE,   Objective: Vitals:   06/29/20 2132 06/29/20 2141 06/29/20 2337 06/30/20 0300  BP: 126/89 129/84 120/84   Pulse: 81  70   Resp: 20  20   Temp: 98.8 F (37.1 C)  98.1 F (36.7 C)   TempSrc: Oral  Oral   SpO2: 97%  99%   Weight:    59.7 kg  Height:        Intake/Output Summary (Last 24 hours) at 06/30/2020 1139 Last data filed at 06/30/2020 2505 Gross per 24 hour  Intake 2605.98 ml  Output 1720 ml  Net 885.98 ml   Filed Weights   06/28/20 0325 06/29/20 0323  06/30/20 0300  Weight: 60.4 kg 59.6 kg 59.7 kg     Examination:  General exam: Appears calm chronically ill-appearing Respiratory system: Normal respiratory rate, rhonchi bilaterally, rales bilaterally Cardiovascular system: Sinus tachycardia on my eval  gastrointestinal system: Positive bowel sounds not distended Central nervous system: Alert and oriented.  Globally weak but no focal neurological deficits. Extremities: Thin frail no contracture  skin: Chest tube in place no obvious drainage, bleeding or purulence at site Psychiatry: No acute decompensation.  Will flat affect    Data Reviewed: I have personally reviewed following labs and imaging studies  CBC: Recent Labs  Lab 06/25/20 0545 06/27/20 0031 06/28/20 0228 06/29/20 0022  WBC 10.3 9.2 8.6 6.4  NEUTROABS 7.8* 6.5  --   --   HGB 8.6* 8.3* 8.3* 8.2*  HCT 29.4* 29.0* 29.0* 29.2*  MCV 81.0 81.0 80.8 80.2  PLT 463* 476* 509* 631*   Basic Metabolic Panel: Recent Labs  Lab 06/25/20 0545 06/27/20 0031 06/28/20 0228 06/29/20 0022  NA 133* 133* 134* 133*  K 4.3 4.1 4.6 4.0  CL 99 102 101 100  CO2 24 26 26 27   GLUCOSE 107* 139* 142* 142*  BUN 9 9 9 10   CREATININE 0.69 0.68 0.63 0.60*  CALCIUM 8.8* 8.6* 8.8* 8.7*   GFR: Estimated Creatinine Clearance: 82.9 mL/min (A) (by C-G formula based on SCr of 0.6 mg/dL (L)). Liver Function Tests: Recent Labs  Lab 06/27/20 0031  AST 38  ALT 124*  ALKPHOS 140*  BILITOT 0.3  PROT 7.3  ALBUMIN 1.9*   No results for input(s): LIPASE, AMYLASE in the last 168 hours. No results for input(s): AMMONIA in the last 168 hours. Coagulation Profile: No results for input(s): INR, PROTIME in the last 168 hours. Cardiac Enzymes: No results for input(s): CKTOTAL, CKMB, CKMBINDEX, TROPONINI in the last 168 hours. BNP (last 3 results) No results for input(s): PROBNP in the last 8760 hours. HbA1C: No results for input(s): HGBA1C in the last 72 hours. CBG: Recent Labs  Lab 06/29/20 1117 06/29/20 1616 06/29/20 2117  06/30/20 0554 06/30/20 1125  GLUCAP 126* 124* 118* 111* 137*   Lipid Profile: No results for input(s): CHOL, HDL, LDLCALC, TRIG, CHOLHDL, LDLDIRECT in the last 72 hours. Thyroid Function Tests: No results for input(s): TSH, T4TOTAL, FREET4, T3FREE, THYROIDAB in the last 72 hours. Anemia Panel: No results for input(s): VITAMINB12, FOLATE, FERRITIN, TIBC, IRON, RETICCTPCT in the last 72 hours. Sepsis Labs: No results for input(s): PROCALCITON, LATICACIDVEN in the last 168 hours.  Recent Results (from the past 240 hour(s))  SARS Coronavirus 2 by RT PCR (hospital order, performed in [facility] hospital lab) Nasopharyngeal Nasopharyngeal Swab     Status: None   Collection Time: 06/21/20  4:20 PM   Specimen: Nasopharyngeal Swab  Result Value Ref Range Status   SARS Coronavirus 2 NEGATIVE NEGATIVE Final    Comment: (NOTE) SARS-CoV-2 target nucleic acids are NOT DETECTED.  The SARS-CoV-2 RNA is generally detectable in upper and lower respiratory specimens during the acute phase of infection. The lowest concentration of SARS-CoV-2 viral copies this assay can detect is 250 copies / mL. A negative result does not preclude SARS-CoV-2 infection and should not be used as the sole basis for treatment or other patient management decisions.  A negative result may occur with improper specimen collection / handling, submission of specimen other than nasopharyngeal swab, presence of viral mutation(s) within the areas targeted by this assay, and inadequate number of viral copies (<250 copies /  mL). A negative result must be combined with clinical observations, patient history, and epidemiological information.  Fact Sheet for Patients:   StrictlyIdeas.no  Fact Sheet for Healthcare Providers: BankingDealers.co.za  This test is not yet approved or  cleared by the [place] FDA and has been authorized for detection and/or diagnosis of SARS-CoV-2  by FDA under an Emergency Use Authorization (EUA).  This EUA will remain in effect (meaning this test can be used) for the duration of the COVID-19 declaration under Section 564(b)(1) of the Act, 21 U.S.C. section 360bbb-3(b)(1), unless the authorization is terminated or revoked sooner.  Performed at [facility], [place] [address], [place], [place] [address]   Culture, blood (Routine X 2) w Reflex to ID Panel     Status: None   Collection Time: 06/22/20  9:54 AM   Specimen: BLOOD RIGHT HAND  Result Value Ref Range Status   Specimen Description BLOOD RIGHT HAND  Final   Special Requests   Final    BOTTLES DRAWN AEROBIC ONLY Blood Culture adequate volume   Culture   Final    NO GROWTH 5 DAYS Performed at [facility], [address], [place], [place] [address]    Report Status 06/27/2020 FINAL  Final  Culture, blood (Routine X 2) w Reflex to ID Panel     Status: None   Collection Time: 06/22/20 10:01 AM   Specimen: BLOOD LEFT ARM  Result Value Ref Range Status   Specimen Description BLOOD LEFT ARM  Final   Special Requests   Final    BOTTLES DRAWN AEROBIC AND ANAEROBIC Blood Culture adequate volume   Culture   Final    NO GROWTH 5 DAYS Performed at [facility], [place] [address] [place], [place] [address]    Report Status 06/27/2020 FINAL  Final  MRSA PCR Screening     Status: Abnormal   Collection Time: 06/23/20 12:30 PM   Specimen: Nasopharyngeal  Result Value Ref Range Status   MRSA by PCR POSITIVE (A) NEGATIVE Final    Comment:        The GeneXpert MRSA Assay (FDA approved for NASAL specimens only), is one component of a comprehensive MRSA colonization surveillance program. It is not intended to diagnose MRSA infection nor to guide or monitor treatment for MRSA infections. RESULT CALLED TO, READ BACK BY AND VERIFIED WITH: L. LEE RN, AT [id number] 06/23/20 [place] Performed at [facility], [place] [address] [place], [place] [address]    Aerobic/Anaerobic Culture (surgical/deep wound)     Status: None (Preliminary result)   Collection Time: 06/25/20 10:58 AM   Specimen: Pleura; Abscess  Result Value Ref Range Status   Specimen Description PLEURAL ABSCESS RIGHT  Final   Special Requests NONE  Final   Gram Stain   Final    ABUNDANT WBC PRESENT, PREDOMINANTLY PMN ABUNDANT GRAM POSITIVE COCCI ABUNDANT GRAM VARIABLE ROD    Culture   Final    ABUNDANT METHICILLIN RESISTANT STAPHYLOCOCCUS AUREUS HOLDING FOR POSSIBLE ANAEROBE Performed at [facility], [place] [address] [place], [place] [address]    Report Status PENDING  Incomplete   Organism ID, Bacteria METHICILLIN RESISTANT STAPHYLOCOCCUS AUREUS  Final      Susceptibility   Methicillin resistant staphylococcus aureus - MIC*    CIPROFLOXACIN >=8 RESISTANT Resistant     ERYTHROMYCIN >=8 RESISTANT Resistant     GENTAMICIN <=0.5 SENSITIVE Sensitive     OXACILLIN >=4 RESISTANT Resistant  TETRACYCLINE <=1 SENSITIVE Sensitive     VANCOMYCIN <=0.5 SENSITIVE Sensitive     TRIMETH/SULFA <=10 SENSITIVE Sensitive     CLINDAMYCIN <=0.25 SENSITIVE Sensitive     RIFAMPIN <=0.5 SENSITIVE Sensitive     Inducible Clindamycin NEGATIVE Sensitive     * ABUNDANT METHICILLIN RESISTANT STAPHYLOCOCCUS AUREUS         Radiology Studies: No results found.      Scheduled Meds: . apixaban  5 mg Per Tube BID  . feeding supplement (PROSource TF)  45 mL Per Tube BID  . FLUoxetine  20 mg Per Tube Daily  . insulin aspart  0-24 Units Subcutaneous TID AC & HS  . mouth rinse  15 mL Mouth Rinse q12n4p  . metoprolol tartrate  100 mg Per Tube BID  . nystatin  5 mL Mouth/Throat QID  . pantoprazole sodium  40 mg Per Tube BID  . sennosides  5 mL Per Tube QHS  . traZODone  50 mg Per Tube QHS   Continuous Infusions: . sodium chloride 1,000 mL (06/06/20 0028)  . sodium chloride    . feeding supplement (OSMOLITE 1.5 CAL) 1,000 mL (06/29/20 2239)  . fluconazole (DIFLUCAN) IV 400 mg  (06/29/20 1248)  . meropenem (MERREM) IV 1 g (06/30/20 0806)  . vancomycin 750 mg (06/30/20 0612)     LOS: 50 days    Time spent: [place]    Nicolette Bang, MD [facility]  If 7PM-7AM, please contact night-coverage  06/30/2020, 11:39 AM

## 2020-07-01 LAB — CBC WITH DIFFERENTIAL/PLATELET
Abs Immature Granulocytes: 0.04 10*3/uL (ref 0.00–0.07)
Basophils Absolute: 0 10*3/uL (ref 0.0–0.1)
Basophils Relative: 1 %
Eosinophils Absolute: 0.2 10*3/uL (ref 0.0–0.5)
Eosinophils Relative: 3 %
HCT: 33.1 % — ABNORMAL LOW (ref 39.0–52.0)
Hemoglobin: 9.4 g/dL — ABNORMAL LOW (ref 13.0–17.0)
Immature Granulocytes: 1 %
Lymphocytes Relative: 30 %
Lymphs Abs: 2.1 10*3/uL (ref 0.7–4.0)
MCH: 23 pg — ABNORMAL LOW (ref 26.0–34.0)
MCHC: 28.4 g/dL — ABNORMAL LOW (ref 30.0–36.0)
MCV: 81.1 fL (ref 80.0–100.0)
Monocytes Absolute: 0.6 10*3/uL (ref 0.1–1.0)
Monocytes Relative: 9 %
Neutro Abs: 4.1 10*3/uL (ref 1.7–7.7)
Neutrophils Relative %: 56 %
Platelets: 620 10*3/uL — ABNORMAL HIGH (ref 150–400)
RBC: 4.08 MIL/uL — ABNORMAL LOW (ref 4.22–5.81)
RDW: 16.2 % — ABNORMAL HIGH (ref 11.5–15.5)
WBC: 7 10*3/uL (ref 4.0–10.5)
nRBC: 0 % (ref 0.0–0.2)

## 2020-07-01 LAB — BASIC METABOLIC PANEL
Anion gap: 8 (ref 5–15)
BUN: 11 mg/dL (ref 6–20)
CO2: 27 mmol/L (ref 22–32)
Calcium: 9 mg/dL (ref 8.9–10.3)
Chloride: 99 mmol/L (ref 98–111)
Creatinine, Ser: 0.67 mg/dL (ref 0.61–1.24)
GFR calc Af Amer: >60 mL/min (ref >60)
GFR calc non Af Amer: >60 mL/min (ref >60)
Glucose, Bld: 104 mg/dL — ABNORMAL HIGH (ref 70–99)
Potassium: 4.3 mmol/L (ref 3.5–5.1)
Sodium: 134 mmol/L — ABNORMAL LOW (ref 135–145)

## 2020-07-01 LAB — GLUCOSE, CAPILLARY
Glucose-Capillary: 118 mg/dL — ABNORMAL HIGH (ref 70–99)
Glucose-Capillary: 97 mg/dL (ref 70–99)
Glucose-Capillary: 117 mg/dL — ABNORMAL HIGH (ref 70–99)
Glucose-Capillary: 101 mg/dL — ABNORMAL HIGH (ref 70–99)

## 2020-07-01 LAB — VANCOMYCIN, TROUGH: Vancomycin Tr: 18 ug/mL (ref 15–20)

## 2020-07-01 NOTE — Progress Notes (Signed)
Pharmacy Antibiotic Note  Evan Chavez is a 59 y.o. male admitted on 05/10/2020, being treated for persistent polymicrobial right pleural empyema.  Pharmacy has been consulted for vancomycin, meropenem, and fluconazole dosing.  Vanc trough 18, at goal.  Plan: This patient's current antibiotics will be continued without adjustments.  Height: 5\' 9"  (175.3 cm) Weight: 59.7 kg (131 lb 9.8 oz) IBW/kg (Calculated) : 70.7  Temp (24hrs), Avg:98.2 F (36.8 C), Min:97.6 F (36.4 C), Max:98.6 F (37 C)  Recent Labs  Lab 06/25/20 0545 06/27/20 0031 06/28/20 0228 06/28/20 1355 06/29/20 0022 07/01/20 0336 07/01/20 2243  WBC 10.3 9.2 8.6  --  6.4 7.0  --   CREATININE 0.69 0.68 0.63  --  0.60* 0.67  --   VANCOTROUGH  --   --   --  5*  --   --  18    Estimated Creatinine Clearance: 82.9 mL/min (by C-G formula based on SCr of 0.67 mg/dL).    No Known Allergies   Thank you for allowing pharmacy to be a part of this patient's care.  Wynona Neat, PharmD, BCPS  07/01/2020 11:39 PM

## 2020-07-01 NOTE — Progress Notes (Signed)
PROGRESS NOTE    Evan Chavez  JOA:[id number] DOB: Jul 03, 1961 DOA: 05/10/2020 PCP: Rosita Fire, MD   Brief Narrative:  Evan Chavez a 59 y.o.Mwith GERD, Barrett's esophagus, peptic ulcer disease with hiatal hernia, duodenal ulcer, CVA, HLD, BPH remote history of substance abuseand recent pneumonia who presented with recurrent pneumonia and parapneumonic effusion.   7/28 presentation to [facility], ED with pneumonia, dischargedfrom ER 7/30 returnedwith worsening symptoms, admitted 8/2 right thoracentesiscomplicated by pneumothorax 8/4 CT chest showed esophageal perforation arising from the right lateral aspect of the mid to distal esophagus and large right-sided hydropneumothorax 8/4 right-sided chest tube placement.Transferred from [facility].  8/5VATSdecortication, EGD, PEG tube placement, esophageal stent placement with a 25 X 125 mm stent by CT surgery. 8/11 repeat esophagogram showed persistent leakage from esophagus to right pleural space. 8/12developedA. fib with RVR 8/13, patient underwent right thoracoscopy, drainage of loculated pleural effusion, esophagus gastroscopy, esophageal stent placement and esophagogram. 8/16, esophagramdidnot show any esophageal leak. 9/13 IR guided right chest tube placement, cultures growing staph aureus,ID consulted 9/19 order ct chest with to eval resolution, possible pigtail d/c   Assessment & Plan:   Principal Problem:   Pleural empyema ([place]) Active Problems:   Essential hypertension   Atrial tachycardia (HCC)   Dyslipidemia   Transaminitis   Gastroesophageal reflux disease with esophagitis   Barrett's esophagus   Thyroid nodule   BPH (benign prostatic hyperplasia)   Ureteral stone with hydronephrosis   Pneumothorax on right   Acute respiratory failure with hypoxia (HCC)   Malnutrition of moderate degree   Esophageal perforation   Pressure injury of skin   Atrial fibrillation with RVR (HCC)   Right lower  lobe pulmonary infiltrate   Dysphagia   Status post thoracentesis   Community-acquiredpneumonia Acute respiratory failure with hypoxia POA. Parapneumonic effusion. Hydropneumothorax SP chest tube placement as well as VATS by CT surgery. Empyema. Sepsis POA Patient presented with sepsis physiology, tachycardia, tachypnea, leukocytosis in setting of pneumonia. This is now resolved.   Due to worsening fever and tachycardia patient had another chest tube placement on 06/25/2020 after initial removal.  Cultures from this September chest tube polymicrobial. Infectious disease consulted, recommending 3 drug regimen -Continue vancomycin; meropenem, fluconazole  -Consulted IR with right chest drain placed -CT Monday AM to eval for improvement of empyema -Consulted CT surgery   Esophageal perforation Last CT scan did not show any significant leakage. Monitor. -Plan foresophageal stentwill be removed the week of September 16 and we will evaluate for healing  Oropharyngealdysphagia. PEG tube placement. On tube feeds. -Continue tube feeds -SLP therapy  Paroxysmal atrial fibrillation Rate controlled -Continue apixaban and metoprolol Echocardiogram showed preserved EF without any wall motion abnormality or valvular abnormality.  Acute on chronic blood lossanemia Hgb stable  Cerebrovascular disease, secondary prevention -Resume Lipitor when LFTs stable  Moderate protein calorie malnutrition Body mass index is 19.66 kg/m.  Left-sided hydronephrosis with ureteral stone Seen initially on presentation. Urology was consulted. Outpatient follow-up with cystoscopy lithotripsy recommended by urology.  Depressions -ContinueProzac  DVT prophylaxis: [id number]  Code Status: FULL    Code Status Orders  (From admission, onward)         Start     Ordered   05/11/20 0134  Full code  Continuous        05/11/20 0138        Code Status History    Date Active  Date Inactive Code Status Order ID Comments User Context   08/21/2018 2357 08/26/2018 2011 Full  Code [id number]  Reubin Milan, MD ED   07/12/2018 1945 07/15/2018 2239 Full Code [id number]  Orson Eva, MD ED   Advance Care Planning Activity     Family Communication: NONE TODAY Disposition Plan:    Status is: Inpatient  Remains inpatient appropriate because:Inpatient level of care appropriate due to severity of illness   Dispo: Patient From:  Planned Disposition: [place] Expected discharge date: 07/16/20 Medically stable for discharge:     The patient was admitted with parapneumonic effusion requiring VATS, etc. severe and complicated subsequent course. He has an esophageal stent, new chest tube, and is on vancomycin, meropenem, and fluconazole.  He will need a chest tube removed, esophageal stent removed, anddetermiination of his treatment course by ID. Consults called: None Admission status: Inpatient   Consultants:   ID, IR, CTS  Procedures:  DG Chest 1 View  Result Date: 06/10/2020 CLINICAL DATA:  Fever and shortness of breath. EXAM: CHEST  1 VIEW COMPARISON:  June 04, 2020 FINDINGS: Stable esophageal stent. Opacity and effusion in the right base remains, more focal medially on this study. The cardiomediastinal silhouette is stable. No pneumothorax. The left lung is clear. No other acute abnormalities. IMPRESSION: 1. Stable esophageal stent. 2. The opacity in the right base is more focal medially. There is some volume loss suggesting a component of atelectasis. Likely associated effusion. Electronically Signed   By: Dorise Bullion III M.D   On: 06/10/2020 15:46   DG Chest 2 View  Result Date: 06/22/2020 CLINICAL DATA:  Fevers, history of esophageal stent placement EXAM: CHEST - 2 VIEW COMPARISON:  06/18/2020, CT from 06/19/2020 FINDINGS: Cardiac shadow is mildly prominent but stable. Mid to  distal esophageal stent is again seen and stable. The left lung remains clear. Right lung demonstrates small pleural effusion as well as right lower lobe consolidation. The overall appearance is similar to that seen on the prior exam. A portion of this is shown to represent loculated fluid on recent CT. No bony abnormality is noted. Gastrostomy catheter is noted in the upper abdomen. IMPRESSION: Persistent right-sided effusion and opacity a portion of which is related to loculated fluid. The overall appearance is similar to that seen on prior exam. Electronically Signed   By: Inez Catalina M.D.   On: 06/22/2020 09:35   CT CHEST W CONTRAST  Result Date: 06/19/2020 CLINICAL DATA:  Pneumonia.  Effusion or abscess suspected. EXAM: CT CHEST WITH CONTRAST TECHNIQUE: Multidetector CT imaging of the chest was performed during intravenous contrast administration. CONTRAST:  1mL OMNIPAQUE IOHEXOL 300 MG/ML  SOLN COMPARISON:  Chest radiographs 06/18/2020, CT chest 06/02/2020 FINDINGS: Cardiovascular: The heart size is upper limits of normal, similar to prior. Coronary artery calcifications. No evidence of pericardial effusion or aortic aneurysm. Mediastinum/Nodes: Redemonstrated esophageal stent with debris/fluid layering within the esophagus. There is stranding of the posterior mediastinal fat adjacent to the esophageal stent. No discrete fluid collection identified within the mediastinum. Previously described the described gas along the right aspect of the stent has resolved. There is a small amount of air adjacent to the left aspect of the stent distally and along the right proximal aspect of the stent. Mildly prominent mediastinal and right hilar lymph nodes, most likely reactive. Lungs/Pleura: Interval increase in a posteromedial loculated right pleural collection, which now measures up to 7.8 by 5.0 cm. The collection has a few punctate areas of internal gas surrounding pleura is enhancing. Overlying linear opacities,  compatible with compressive atelectasis. Otherwise, the lungs are clear.  Upper Abdomen: Probable moderate left hydronephrosis, partially imaged but grossly similar to prior stress CT. Musculoskeletal: No chest wall abnormality. No acute or significant osseous findings. IMPRESSION: 1. Interval increase in a loculated right posteromedial pleural fluid collection with surrounding enhancing pleura, which is sterility indeterminate by imaging but possibly an empyema. 2. Redemonstrated esophageal stent with layering debris/fluid. Stranding in the surrounding posterior mediastinum could be postsurgical or relate to persistent esophageal leak and/or mediastinitis. No discrete drainable mediastinal fluid collection. 3. Similar small right pleural effusion with overlying compressive atelectasis. Electronically Signed   By: Margaretha Sheffield MD   On: 06/19/2020 10:31   CT CHEST W CONTRAST  Result Date: 06/02/2020 CLINICAL DATA:  59 year old male with recent esophageal perforation with esophageal stent placement and VATS for RIGHT empyema. EXAM: CT CHEST WITH CONTRAST TECHNIQUE: Multidetector CT imaging of the chest was performed during intravenous contrast administration. CONTRAST:  67mL OMNIPAQUE IOHEXOL 300 MG/ML  SOLN COMPARISON:  05/24/2020 CT FINDINGS: Cardiovascular: UPPER limits normal heart size again noted. Heavy coronary artery calcifications are again identified. There is no evidence of thoracic aortic aneurysm or pericardial effusion. Mediastinum/Nodes: Esophageal stent is again noted. A small amount of paraesophageal gas is again noted and connects to the posteromedial loculated RIGHT pleural collection which measures 2.8 x 7.7 cm in greatest diameter, previously 4.2 x 7.7 cm. No new findings are noted. Mildly prominent mediastinal and RIGHT hilar lymph nodes are likely reactive. Lungs/Pleura: A RIGHT thoracostomy tube courses through the 2.8 x 7.7 cm posteromedial loculated RIGHT pleural collection with tip  located 8 cm SUPERIOR to the collection. A small RIGHT pleural effusion is noted. Moderate RIGHT LOWER lung atelectasis is present. There is no evidence of pneumothorax. Upper Abdomen: Probable moderate LEFT hydronephrosis again noted. Musculoskeletal: No acute or suspicious bony abnormalities are present. IMPRESSION: 1. Slightly decreased size of posteromedial RIGHT pleural collection/empyema from 05/24/2020. The RIGHT thoracostomy tube courses through this collection with tip located 8 cm superiorly. 2. Small RIGHT pleural effusion and moderate RIGHT LOWER lung atelectasis. 3. Esophageal stent again noted. 4. Probable moderate LEFT hydronephrosis. Consider further evaluation with ultrasound or CT as clinically indicated. 5. Coronary artery disease andaortic Atherosclerosis (ICD10-I70.0). Electronically Signed   By: Margarette Canada M.D.   On: 06/02/2020 14:41   CT ABDOMEN PELVIS W CONTRAST  Result Date: 06/22/2020 CLINICAL DATA:  Nausea and vomiting, vomited tube feedings EXAM: CT ABDOMEN AND PELVIS WITH CONTRAST TECHNIQUE: Multidetector CT imaging of the abdomen and pelvis was performed using the standard protocol following bolus administration of intravenous contrast. CONTRAST:  171mL OMNIPAQUE IOHEXOL 300 MG/ML  SOLN COMPARISON:  06/19/2020, 05/13/2020 FINDINGS: Lower chest: Persistent complex loculated right pleural fluid collection, suspicious for empyema/abscess given history of esophageal perforation. Trace free-flowing right pleural fluid. Significant consolidation of the right lower lobe consistent with atelectasis. Esophageal stent identified, with fluid and debris within the esophageal lumen. Hepatobiliary: No focal liver abnormality is seen. No gallstones, gallbladder wall thickening, or biliary dilatation. Pancreas: Unremarkable. No pancreatic ductal dilatation or surrounding inflammatory changes. Spleen: Normal in size without focal abnormality. Adrenals/Urinary Tract: Persistent left-sided  hydronephrosis related to an obstructing 11 mm proximal left ureteral calculus. Multiple punctate nonobstructing left renal calculi again identified unchanged. Punctate nonobstructing right renal calculi are identified. Right kidney enhances normally. No right-sided obstruction. Bladder is unremarkable.  The adrenals are normal. Stomach/Bowel: Percutaneous gastrostomy tube is identified. No bowel obstruction or ileus. No bowel wall thickening or inflammatory change. Vascular/Lymphatic: Aortic atherosclerosis. No enlarged abdominal or pelvic lymph nodes.  Reproductive: Prostate is unremarkable. Other: No free fluid or free intraperitoneal gas. No abdominal wall hernia. Musculoskeletal: No acute or destructive bony lesions. Reconstructed images demonstrate no additional findings. IMPRESSION: 1. Persistent complex loculated right pleural fluid collection, concerning for abscess. Persistent esophageal perforation cannot be excluded in light of previous findings. 2. Stable esophageal stent, with debris and fluid in the esophageal lumen as before. 3. Persistent left-sided hydronephrosis related to an 11 mm obstructing proximal left ureteral calculus. 4. Other bilateral nonobstructing renal calculi 5.  Aortic Atherosclerosis (ICD10-I70.0). Electronically Signed   By: Randa Ngo M.D.   On: 06/22/2020 19:38   DG Chest Port 1 View  Result Date: 06/27/2020 CLINICAL DATA:  Empyema of right pleural space. EXAM: PORTABLE CHEST 1 VIEW COMPARISON:  June 22, 2020. FINDINGS: Stable cardiomediastinal silhouette. Esophageal stent is unchanged in position. There is interval placement of pleural drainage catheter in the right lung base. Right pleural effusion noted on prior exam is slightly decreased in size. No pneumothorax is noted. Left lung is clear. Bony thorax is unremarkable. IMPRESSION: Interval placement of pleural drainage catheter in right lung base. Right pleural effusion noted on prior exam is slightly decreased  in size. Electronically Signed   By: Marijo Conception M.D.   On: 06/27/2020 12:25   DG CHEST PORT 1 VIEW  Result Date: 06/18/2020 CLINICAL DATA:  Rhonchi EXAM: PORTABLE CHEST 1 VIEW COMPARISON:  Eight days ago FINDINGS: Increased hazy density at the right base where there is a pleural effusion and atelectasis by recent CT. Stable positioning of the esophageal stent. Normal heart size and aortic contours. No visible pneumothorax. IMPRESSION: Right pleural effusion and lower lobe collapse with mildly increased opacity since 06/10/2020. Electronically Signed   By: Monte Fantasia M.D.   On: 06/18/2020 09:47   DG CHEST PORT 1 VIEW  Result Date: 06/04/2020 CLINICAL DATA:  Pneumonia EXAM: PORTABLE CHEST 1 VIEW COMPARISON:  Chest radiograph and chest CT June 02, 2020 FINDINGS: Esophageal stent again noted, unchanged in position. Chest tube present on the right. No evident pneumothorax. There is a small right pleural effusion with airspace opacity in the right mid and lower lung regions. Left lung is clear. Heart is borderline enlarged with pulmonary vascularity normal. No adenopathy. No bone lesions. IMPRESSION: Persistent small right pleural effusion with airspace opacity, likely combination of atelectasis and pneumonia, in the right mid and lower lung regions. Left lung clear. Stable cardiac silhouette. Stable positioning of esophageal stent. Right chest tube present without evident pneumothorax. Electronically Signed   By: Lowella Grip III M.D.   On: 06/04/2020 08:13   DG CHEST PORT 1 VIEW  Result Date: 06/02/2020 CLINICAL DATA:  Tachypnea EXAM: PORTABLE CHEST 1 VIEW COMPARISON:  June 01, 2020 FINDINGS: An esophageal stent is again identified. Stable right chest tube. No pneumothorax. Opacity and effusion remain in the right base. No other interval changes. IMPRESSION: 1. Small right effusion underlying opacity. The opacity could represent atelectasis or infiltrate. No other changes. Electronically  Signed   By: Dorise Bullion III M.D   On: 06/02/2020 12:41   DG CHEST PORT 1 VIEW  Result Date: 06/01/2020 CLINICAL DATA:  Chest tube. Shortness of breath. EXAM: PORTABLE CHEST 1 VIEW COMPARISON:  May 29, 2020 FINDINGS: Since the prior study the right apical chest tube seen on the prior study has been removed. The additional right-sided chest tube seen on the prior exam is stable in position. Persistent moderate to marked severity right basilar atelectasis and/or infiltrate is  noted. There is a small right pleural effusion. A small amount of right apical pleural fluid is also suspected. No residual pneumothorax is identified. The cardiac silhouette is mildly enlarged. A radiopaque stent is seen overlying the mid and distal esophagus. The visualized skeletal structures are unremarkable. IMPRESSION: 1. Interval right apical chest tube removal without evidence of a residual pneumothorax. 2. Persistent moderate to marked severity right basilar atelectasis and/or infiltrate. 3. Small right pleural effusion. Electronically Signed   By: Virgina Norfolk M.D.   On: 06/01/2020 16:11   CT IMAGE GUIDED DRAINAGE BY PERCUTANEOUS CATHETER  Result Date: 06/25/2020 INDICATION: 59 year old male with left-sided empyema versus lung abscess EXAM: CT GUIDED DRAINAGE OF  ABSCESS MEDICATIONS: The patient is currently admitted to the hospital and receiving intravenous antibiotics. The antibiotics were administered within an appropriate time frame prior to the initiation of the procedure. ANESTHESIA/SEDATION: 0.5 mg IV Versed 25 mcg IV Fentanyl Moderate Sedation Time:  13 minutes The patient was continuously monitored during the procedure by the interventional radiology nurse under my direct supervision. COMPLICATIONS: None TECHNIQUE: Informed written consent was obtained from the patient after a thorough discussion of the procedural risks, benefits and alternatives. All questions were addressed. Maximal Sterile Barrier  Technique was utilized including caps, mask, sterile gowns, sterile gloves, sterile drape, hand hygiene and skin antiseptic. A timeout was performed prior to the initiation of the procedure. PROCEDURE: The operative field was prepped with Chlorhexidine in a sterile fashion, and a sterile drape was applied covering the operative field. A sterile gown and sterile gloves were used for the procedure. Local anesthesia was provided with 1% Lidocaine. Scout CT was acquired. Patient was prepped and draped in the usual sterile fashion. 1% lidocaine was used for local anesthesia. Yueh needle was passed into the fluid collection of the right chest confirming the needle tip position with aspiration of purulent fluid. Using modified Seldinger technique, a 12 French drain was placed and attached to evacuation chamber. Sample was sent for culture Drain was sutured in position.  Sterile bandage was placed. Patient tolerated the procedure well and remained hemodynamically stable throughout. No complications were encountered and no significant blood loss. FINDINGS: Left chest fluid collection, either empyema or lung abscess. IMPRESSION: Status post CT-guided drainage of right chest abscess/empyema. Signed, Dulcy Fanny. Dellia Nims, RPVI [facility] Electronically Signed   By: Corrie Mckusick D.O.   On: 06/25/2020 13:06      Subjective: More awake and interactive today, no acute decomp overnight   Objective: Vitals:   06/30/20 2325 07/01/20 0443 07/01/20 0600 07/01/20 0744  BP: 128/86 121/82 128/81 125/85  Pulse: 82 84    Resp: 18 20  (!) 21  Temp: 98.3 F (36.8 C) 98.3 F (36.8 C)  98.2 F (36.8 C)  TempSrc: Oral   Oral  SpO2: 100% 99%  95%  Weight:      Height:        Intake/Output Summary (Last 24 hours) at 07/01/2020 1220 Last data filed at 07/01/2020 0600 Gross per 24 hour  Intake 2102 ml  Output 1900 ml  Net 202 ml   Filed Weights   06/28/20 0325  06/29/20 0323 06/30/20 0300  Weight: 60.4 kg 59.6 kg 59.7 kg    Examination:  General exam: Appears calm chronically ill-appearing Respiratory system: Normal respiratory rate, rhonchi bilaterally, rales bilaterally Cardiovascular system: Sinus tachycardia on my eval  gastrointestinal system: Positive bowel sounds not distended Central nervous system: Alert and oriented.  Globally weak but no focal neurological deficits. Extremities: Thin frail no contracture  skin: Chest drain in place, no obvious drainage, bleeding or purulence at site Psychiatry: No acute decompensation, flat affect.     Data Reviewed: I have personally reviewed following labs and imaging studies  CBC: Recent Labs  Lab 06/25/20 0545 06/27/20 0031 06/28/20 0228 06/29/20 0022 07/01/20 0336  WBC 10.3 9.2 8.6 6.4 7.0  NEUTROABS 7.8* 6.5  --   --  4.1  HGB 8.6* 8.3* 8.3* 8.2* 9.4*  HCT 29.4* 29.0* 29.0* 29.2* 33.1*  MCV 81.0 81.0 80.8 80.2 81.1  PLT 463* 476* 509* 475* 267*   Basic Metabolic Panel: Recent Labs  Lab 06/25/20 0545 06/27/20 0031 06/28/20 0228 06/29/20 0022 07/01/20 0336  NA 133* 133* 134* 133* 134*  K 4.3 4.1 4.6 4.0 4.3  CL 99 102 101 100 99  CO2 24 26 26 27 27   GLUCOSE 107* 139* 142* 142* 104*  BUN 9 9 9 10 11   CREATININE 0.69 0.68 0.63 0.60* 0.67  CALCIUM 8.8* 8.6* 8.8* 8.7* 9.0   GFR: Estimated Creatinine Clearance: 82.9 mL/min (by C-G formula based on SCr of 0.67 mg/dL). Liver Function Tests: Recent Labs  Lab 06/27/20 0031  AST 38  ALT 124*  ALKPHOS 140*  BILITOT 0.3  PROT 7.3  ALBUMIN 1.9*   No results for input(s): LIPASE, AMYLASE in the last 168 hours. No results for input(s): AMMONIA in the last 168 hours. Coagulation Profile: No results for input(s): INR, PROTIME in the last 168 hours. Cardiac Enzymes: No results for input(s): CKTOTAL, CKMB, CKMBINDEX, TROPONINI in the last 168 hours. BNP (last 3 results) No results for input(s): PROBNP in the last 8760  hours. HbA1C: No results for input(s): HGBA1C in the last 72 hours. CBG: Recent Labs  Lab 06/30/20 1125 06/30/20 1613 06/30/20 2151 07/01/20 0627 07/01/20 1148  GLUCAP 137* 115* 108* 118* 97   Lipid Profile: No results for input(s): CHOL, HDL, LDLCALC, TRIG, CHOLHDL, LDLDIRECT in the last 72 hours. Thyroid Function Tests: No results for input(s): TSH, T4TOTAL, FREET4, T3FREE, THYROIDAB in the last 72 hours. Anemia Panel: No results for input(s): VITAMINB12, FOLATE, FERRITIN, TIBC, IRON, RETICCTPCT in the last 72 hours. Sepsis Labs: No results for input(s): PROCALCITON, LATICACIDVEN in the last 168 hours.  Recent Results (from the past 240 hour(s))  SARS Coronavirus 2 by RT PCR (hospital order, performed in [facility] hospital lab) Nasopharyngeal Nasopharyngeal Swab     Status: None   Collection Time: 06/21/20  4:20 PM   Specimen: Nasopharyngeal Swab  Result Value Ref Range Status   SARS Coronavirus 2 NEGATIVE NEGATIVE Final    Comment: (NOTE) SARS-CoV-2 target nucleic acids are NOT DETECTED.  The SARS-CoV-2 RNA is generally detectable in upper and lower respiratory specimens during the acute phase of infection. The lowest concentration of SARS-CoV-2 viral copies this assay can detect is 250 copies / mL. A negative result does not preclude SARS-CoV-2 infection and should not be used as the sole basis for treatment or other patient management decisions.  A negative result may occur with improper specimen collection / handling, submission of specimen other than nasopharyngeal swab, presence of viral mutation(s) within the areas targeted by this assay, and inadequate number of viral copies (<250 copies / mL). A negative result must be combined with clinical observations, patient history, and epidemiological information.  Fact Sheet for Patients:   StrictlyIdeas.no  Fact Sheet for Healthcare  Providers: BankingDealers.co.za  This test is not yet approved  or  cleared by the [place] and has been authorized for detection and/or diagnosis of SARS-CoV-2 by FDA under an Emergency Use Authorization (EUA).  This EUA will remain in effect (meaning this test can be used) for the duration of the COVID-19 declaration under Section 564(b)(1) of the Act, 21 U.S.C. section 360bbb-3(b)(1), unless the authorization is terminated or revoked sooner.  Performed at [facility], [place] [address] [place], [place] [address]   Culture, blood (Routine X 2) w Reflex to ID Panel     Status: None   Collection Time: 06/22/20  9:54 AM   Specimen: BLOOD RIGHT HAND  Result Value Ref Range Status   Specimen Description BLOOD RIGHT HAND  Final   Special Requests   Final    BOTTLES DRAWN AEROBIC ONLY Blood Culture adequate volume   Culture   Final    NO GROWTH 5 DAYS Performed at [facility], [address] [place], [place] [address]    Report Status 06/27/2020 FINAL  Final  Culture, blood (Routine X 2) w Reflex to ID Panel     Status: None   Collection Time: 06/22/20 10:01 AM   Specimen: BLOOD LEFT ARM  Result Value Ref Range Status   Specimen Description BLOOD LEFT ARM  Final   Special Requests   Final    BOTTLES DRAWN AEROBIC AND ANAEROBIC Blood Culture adequate volume   Culture   Final    NO GROWTH 5 DAYS Performed at [facility], [place] [address], [place], [place] [address]    Report Status 06/27/2020 FINAL  Final  MRSA PCR Screening     Status: Abnormal   Collection Time: 06/23/20 12:30 PM   Specimen: Nasopharyngeal  Result Value Ref Range Status   MRSA by PCR POSITIVE (A) NEGATIVE Final    Comment:        The GeneXpert MRSA Assay (FDA approved for NASAL specimens only), is one component of a comprehensive MRSA colonization surveillance program. It is not intended to diagnose MRSA infection nor to guide or monitor treatment for MRSA  infections. RESULT CALLED TO, READ BACK BY AND VERIFIED WITH: L. LEE RN, AT [id number] 06/23/20 [place] Performed at [facility], [place] [address], [place], [place] [address]   Aerobic/Anaerobic Culture (surgical/deep wound)     Status: None (Preliminary result)   Collection Time: 06/25/20 10:58 AM   Specimen: Pleura; Abscess  Result Value Ref Range Status   Specimen Description PLEURAL ABSCESS RIGHT  Final   Special Requests NONE  Final   Gram Stain   Final    ABUNDANT WBC PRESENT, PREDOMINANTLY PMN ABUNDANT GRAM POSITIVE COCCI ABUNDANT GRAM VARIABLE ROD Performed at [facility], [address] [place],  [address]    Culture   Final    ABUNDANT METHICILLIN RESISTANT STAPHYLOCOCCUS AUREUS ABUNDANT PREVOTELLA DENTICOLA BETA LACTAMASE POSITIVE ABUNDANT STREPTOCOCCUS CONSTELLATUS    Report Status PENDING  Incomplete   Organism ID, Bacteria METHICILLIN RESISTANT STAPHYLOCOCCUS AUREUS  Final      Susceptibility   Methicillin resistant staphylococcus aureus - MIC*    CIPROFLOXACIN >=8 RESISTANT Resistant     ERYTHROMYCIN >=8 RESISTANT Resistant     GENTAMICIN <=0.5 SENSITIVE Sensitive     OXACILLIN >=4 RESISTANT Resistant     TETRACYCLINE <=1 SENSITIVE Sensitive     VANCOMYCIN <=0.5 SENSITIVE Sensitive     TRIMETH/SULFA <=10 SENSITIVE Sensitive     CLINDAMYCIN <=0.25 SENSITIVE Sensitive  RIFAMPIN <=0.5 SENSITIVE Sensitive     Inducible Clindamycin NEGATIVE Sensitive     * ABUNDANT METHICILLIN RESISTANT STAPHYLOCOCCUS AUREUS         Radiology Studies: No results found.      Scheduled Meds: . apixaban  5 mg Per Tube BID  . feeding supplement (PROSource TF)  45 mL Per Tube BID  . FLUoxetine  20 mg Per Tube Daily  . insulin aspart  0-24 Units Subcutaneous TID AC & HS  . mouth rinse  15 mL Mouth Rinse q12n4p  . metoprolol tartrate  100 mg Per Tube BID  . nystatin  5 mL Mouth/Throat QID  . pantoprazole sodium  40 mg Per Tube BID  . sennosides  5 mL  Per Tube QHS  . traZODone  50 mg Per Tube QHS   Continuous Infusions: . sodium chloride 1,000 mL (06/06/20 0028)  . sodium chloride    . feeding supplement (OSMOLITE 1.5 CAL) 60 mL/hr at 07/01/20 0600  . fluconazole (DIFLUCAN) IV 400 mg (06/30/20 1223)  . meropenem (MERREM) IV 1 g (07/01/20 0759)  . vancomycin 750 mg (07/01/20 0605)     LOS: 51 days    Time spent: 35 min    Nicolette Bang, MD [facility]  If 7PM-7AM, please contact night-coverage  07/01/2020, 12:20 PM

## 2020-07-02 ENCOUNTER — Inpatient Hospital Stay (HOSPITAL_COMMUNITY): Payer: Medicaid Other

## 2020-07-02 DIAGNOSIS — R6520 Severe sepsis without septic shock: Secondary | ICD-10-CM

## 2020-07-02 DIAGNOSIS — I5032 Chronic diastolic (congestive) heart failure: Secondary | ICD-10-CM | POA: Diagnosis present

## 2020-07-02 LAB — GLUCOSE, CAPILLARY
Glucose-Capillary: 131 mg/dL — ABNORMAL HIGH (ref 70–99)
Glucose-Capillary: 97 mg/dL (ref 70–99)
Glucose-Capillary: 126 mg/dL — ABNORMAL HIGH (ref 70–99)

## 2020-07-02 LAB — HEPATIC FUNCTION PANEL
ALT: 80 U/L — ABNORMAL HIGH (ref 0–44)
AST: 29 U/L (ref 15–41)
Albumin: 2.2 g/dL — ABNORMAL LOW (ref 3.5–5.0)
Alkaline Phosphatase: 110 U/L (ref 38–126)
Bilirubin, Direct: <0.1 mg/dL (ref 0.0–0.2)
Total Bilirubin: 0.3 mg/dL (ref 0.3–1.2)
Total Protein: 7.4 g/dL (ref 6.5–8.1)

## 2020-07-02 LAB — CBC WITH DIFFERENTIAL/PLATELET
Abs Immature Granulocytes: 0.06 10*3/uL (ref 0.00–0.07)
Basophils Absolute: 0.1 10*3/uL (ref 0.0–0.1)
Basophils Relative: 1 %
Eosinophils Absolute: 0.2 10*3/uL (ref 0.0–0.5)
Eosinophils Relative: 3 %
HCT: 30.6 % — ABNORMAL LOW (ref 39.0–52.0)
Hemoglobin: 8.9 g/dL — ABNORMAL LOW (ref 13.0–17.0)
Immature Granulocytes: 1 %
Lymphocytes Relative: 35 %
Lymphs Abs: 2.3 10*3/uL (ref 0.7–4.0)
MCH: 23.4 pg — ABNORMAL LOW (ref 26.0–34.0)
MCHC: 29.1 g/dL — ABNORMAL LOW (ref 30.0–36.0)
MCV: 80.3 fL (ref 80.0–100.0)
Monocytes Absolute: 0.6 10*3/uL (ref 0.1–1.0)
Monocytes Relative: 10 %
Neutro Abs: 3.4 10*3/uL (ref 1.7–7.7)
Neutrophils Relative %: 50 %
Platelets: 548 10*3/uL — ABNORMAL HIGH (ref 150–400)
RBC: 3.81 MIL/uL — ABNORMAL LOW (ref 4.22–5.81)
RDW: 16.5 % — ABNORMAL HIGH (ref 11.5–15.5)
WBC: 6.6 10*3/uL (ref 4.0–10.5)
nRBC: 0 % (ref 0.0–0.2)

## 2020-07-02 LAB — BASIC METABOLIC PANEL
Anion gap: 8 (ref 5–15)
BUN: 13 mg/dL (ref 6–20)
CO2: 27 mmol/L (ref 22–32)
Calcium: 8.9 mg/dL (ref 8.9–10.3)
Chloride: 99 mmol/L (ref 98–111)
Creatinine, Ser: 0.59 mg/dL — ABNORMAL LOW (ref 0.61–1.24)
GFR calc Af Amer: >60 mL/min (ref >60)
GFR calc non Af Amer: >60 mL/min (ref >60)
Glucose, Bld: 118 mg/dL — ABNORMAL HIGH (ref 70–99)
Potassium: 4 mmol/L (ref 3.5–5.1)
Sodium: 134 mmol/L — ABNORMAL LOW (ref 135–145)

## 2020-07-02 LAB — AEROBIC/ANAEROBIC CULTURE W GRAM STAIN (SURGICAL/DEEP WOUND)

## 2020-07-02 LAB — BRAIN NATRIURETIC PEPTIDE: B Natriuretic Peptide: 24.8 pg/mL (ref 0.0–100.0)

## 2020-07-02 LAB — PROCALCITONIN: Procalcitonin: <0.1 ng/mL

## 2020-07-02 MED ORDER — ATORVASTATIN CALCIUM 40 MG PO TABS
40.0000 mg | ORAL_TABLET | Freq: Every day | ORAL | Status: DC
  Administered 2020-07-02 – 2020-07-12 (×11): 40 mg
  Filled 2020-07-02 (×10): qty 1

## 2020-07-02 MED ORDER — IOHEXOL 300 MG/ML  SOLN
75.0000 mL | Freq: Once | INTRAMUSCULAR | Status: AC | PRN
  Administered 2020-07-02: 75 mL via INTRAVENOUS

## 2020-07-02 NOTE — Progress Notes (Signed)
Pt. REFUSED IV start for CT scan, A/O x4. RN  Aware ,at bedside.

## 2020-07-02 NOTE — Evaluation (Signed)
Clinical/Bedside Swallow Evaluation Patient Details  Name: Evan Chavez MRN: [id number] Date of Birth: [birth year]  Today's Date: 07/02/2020 Time: SLP Start Time (ACUTE ONLY): 1340 SLP Stop Time (ACUTE ONLY): 1355 SLP Time Calculation (min) (ACUTE ONLY): 15 min  Past Medical History:  Past Medical History:  Diagnosis Date  . Barrett's esophagus   . Chronic anemia   . Cocaine abuse ([place])   . Duodenal ulcer   . Esophageal stricture   . ETOH abuse   . GERD (gastroesophageal reflux disease)   . Hyperlipidemia   . Hypertension   . Peptic ulcer   . Pneumonia   . 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 BIOPSY  08/25/2018   Procedure: BIOPSY;  Surgeon: [name], MD;  [facility];  Service: Endoscopy;;  . BIOPSY  12/13/2018   Procedure: BIOPSY;  Surgeon: [name], MD;  [facility];  Service: Endoscopy;;  esophagus  . COLONOSCOPY WITH PROPOFOL N/A 04/07/2019   Dr. [name]: 3 Tubular adenomas removed (4 to 8 mm in size), next colonoscopy 3 years.  . ESOPHAGEAL STENT PLACEMENT N/A 05/17/2020   Procedure: ESOPHAGEAL STENT PLACEMENT AND  PEG TUBE PLACEMENT;  Surgeon: Lajuana Matte, MD;  Location: [facility];  Service: Thoracic;  Laterality: N/A;  . ESOPHAGEAL STENT PLACEMENT N/A 05/25/2020   Procedure: ESOPHAGEAL STENT PLACEMENT USING A 23 MM COVERED STENT;  Surgeon: Lajuana Matte, MD;  Location: [facility];  Service: Thoracic;  Laterality: N/A;  . ESOPHAGOGASTRODUODENOSCOPY N/A 05/17/2020   Procedure: ESOPHAGOGASTRODUODENOSCOPY (EGD);  Surgeon: Lajuana Matte, MD;  Location: [facility];  Service: Thoracic;  Laterality: N/A;  . ESOPHAGOGASTRODUODENOSCOPY N/A 05/25/2020   Procedure: ESOPHAGOGASTRODUODENOSCOPY (EGD); ESOPHAGEAL STENT REMOVAL.;  Surgeon: Lajuana Matte, MD;  Location: [facility];  Service: Thoracic;  Laterality: N/A;  .  ESOPHAGOGASTRODUODENOSCOPY (EGD) WITH PROPOFOL N/A 08/25/2018   Dr. [name]: Severely inflamed abnormal appearing mid/distal esophagus.  Encroachment on lumen suspicious for infiltrating neoplasm located be all from severe benign inflammation, extensive gastric erosions and extensive duodenal ulcerations.  Query ischemic process versus other.  Biopsy revealed Barrett's esophagus but negative for malignancy and H. pylori.  . ESOPHAGOGASTRODUODENOSCOPY (EGD) WITH PROPOFOL N/A 12/13/2018   Dr. [name]: peptic stricture s/p balloon dilatation. abnormal esophageal mucosa, by c/w barrett's without dysplasia, previous PUD completely healed.   Marland Kitchen POLYPECTOMY  04/07/2019   Procedure: POLYPECTOMY;  Surgeon: [name], MD;  [facility];  Service: Endoscopy;;  colon  . VIDEO ASSISTED THORACOSCOPY (VATS)/DECORTICATION Right 05/17/2020   Procedure: RIGHT VIDEO ASSISTED THORACOSCOPY (VATS)/DECORTICATION;  Surgeon: Lajuana Matte, MD;  Location: [facility];  Service: Thoracic;  Laterality: Right;  Marland Kitchen VIDEO ASSISTED THORACOSCOPY (VATS)/EMPYEMA Right 05/25/2020   Procedure: VIDEO ASSISTED THORACOSCOPY (VATS)/EMPYEMA;  Surgeon: Lajuana Matte, MD;  Location[facility];  Service: Thoracic;  Laterality: Right;   HPI:  Evan Chavez is a 59 y.o. year old male initally admitted with generalized weakness, productive cough and low-grade fever shortness of breath, dx with pneumonia. Pt had an MBS on 05/14/20 (prior to any notes indicating esopahgeal perforation) that showed mild oropharyngeal dysphagia with only premature spillage, mild oral residue. Pt recommended to consume regular/thin.  Study reviewed by Dr. Elsworth Soho. On that same day, pt underwent an ultrasound-guided thoracentesis to define parapneumonic effusion, suspected to be from aspiration pna due to esophageal stricture. Found to have RT apical pneumothorax.  Pt was then transferred to [facility] for a VATS decortication (not done?) and Chest tube placed. Post  procedure CT chest performed which showed esophageal perforation. CTCS then reported "He has a long history of peptic strictures that have been dilated on multiple occasions.  It appears as though this may be the culprit for his perforation."  Underwent esophageal stent placement and PEG tube placement on 8/4-8/5.  Did have episodes of persistent leakage from esophagus on esophagram as well as requiring right thorascopic for drainage of loculated pleural effusion on 8/13 by CT surgery,  repeat esophagram on 8/16 did not show any esophageal leak. Pt has not been accepting oral PO, or has been orally holding his secretions. Has also been treated for candida. PMH: GERD, Barrett's esophagus, peptic ulcer disease with extensive gastric erosion, duodenal ulcerand esophageal stricture status post balloon dilatation on 12/2018, also with history of prior stroke, hyperlipidemia, BPH, remote cocaine abuse and prior CVA. Underwent FEES 8/25, which revealed standing secretions, mobility WNL for non-swallow tasks.  There was no observable oropharyngeal dysphagia.  Deficits were related to oral holding with eventual suctioning required. Pt subsequently seen by SLP for three sessions - bolus holding continued to be problematic despite use of multiple interventions from SLP including thermal stimulation, tactile cueing, increased tactile feedback to superior lingual surface, and verbal cueing.  Pt was d/cd from SLP service given lack of progress.    Assessment / Plan / Recommendation Clinical Impression  Requested to repeat swallowing assessment given improvements in mental status.  Pt alert; his mother was at bedside.  Demonstrated right facial asymmetry, symmetric tongue upon extension, c/w findings during initial swallow assessment and likely related to prior left CVA.  He did not verbalize nor answer questions.  He declined all offers of PO, eventually accepting multiple sips of water, achieving a swallow response, then  pointing to retention of water in mouth and reaching for oral suctioning. Further attempts at drinking liquid led to ongoing oral holding, with pt shaking head "no" and pointing to his mouth.  He declined all solids.  Upon the urging of his mother to eat pudding, he responded twice "I told you I didn't want none!"  Based on prior instrumental swallow studies, pharyngeal swallow is functional.  Recommend allowing POs per pt's preferences if he is able to make those known (starting with mechanical solids and with full supervision). Unfortunately, given his poor participation, there is little else our service can offer.  Provided support/encouragement and discussed recommendations with RN.  SLP will sign off.  SLP Visit Diagnosis: Dysphagia, unspecified (R13.10)    Aspiration Risk  Risk for inadequate nutrition/hydration    Diet Recommendation     Medication Administration: Whole meds with liquid    Other  Recommendations Oral Care Recommendations: Oral care QID Other Recommendations: Have oral suction available   Follow up Recommendations None;24 hour supervision/assistance      Frequency and Duration            Prognosis        Swallow Study   General Date of Onset: 05/10/20 HPI: Han Vejar is a 59 y.o. year old male initally admitted with generalized weakness, productive cough and low-grade fever shortness of breath, dx with pneumonia. Pt had an MBS on 05/14/20 (prior to any notes indicating esopahgeal perforation) that showed mild oropharyngeal dysphagia with only premature spillage, mild oral residue. Pt recommended to consume regular/thin.  Study reviewed by Dr. Elsworth Soho. On that same day, pt underwent an ultrasound-guided  thoracentesis to define parapneumonic effusion, suspected to be from aspiration pna due to esophageal stricture. Found to have RT apical pneumothorax. Pt was then transferred to [facility] for a VATS decortication (not done?) and Chest tube placed. Post procedure CT chest  performed which showed esophageal perforation. CTCS then reported "He has a long history of peptic strictures that have been dilated on multiple occasions.  It appears as though this may be the culprit for his perforation."  Underwent esophageal stent placement and PEG tube placement on 8/4-8/5.  Did have episodes of persistent leakage from esophagus on esophagram as well as requiring right thorascopic for drainage of loculated pleural effusion on 8/13 by CT surgery,  repeat esophagram on 8/16 did not show any esophageal leak. Pt has not been accepting oral PO, or has been orally holding his secretions. Has also been treated for candida. PMH: GERD, Barrett's esophagus, peptic ulcer disease with extensive gastric erosion, duodenal ulcerand esophageal stricture status post balloon dilatation on 12/2018, also with history of prior stroke, hyperlipidemia, BPH, remote cocaine abuse and prior CVA. Underwent FEES 8/25, which revealed standing secretions, mobility WNL for non-swallow tasks.  There was no observable oropharyngeal dysphagia.  Deficits were related to oral holding with eventual suctioning required. Pt subsequently seen by SLP for three sessions - bolus holding continued to be problematic despite use of multiple interventions from SLP including thermal stimulation, tactile cueing, increased tactile feedback to superior lingual surface, and verbal cueing.  Pt was d/cd from SLP service given lack of progress.  Type of Study: Bedside Swallow Evaluation Diet Prior to this Study: Thin liquids Temperature Spikes Noted: No Respiratory Status: Room air History of Recent Intubation: No Behavior/Cognition: Alert;Requires cueing Oral Cavity Assessment: Other (comment) (potential candida on tongue) Oral Care Completed by SLP: No Oral Cavity - Dentition: Adequate natural dentition Patient Positioning: Upright in bed Baseline Vocal Quality: Other (comment) (limited verbalization) Volitional Cough: Other (Comment)  (did not cough when cued) Volitional Swallow: Unable to elicit    Oral/Motor/Sensory Function Overall Oral Motor/Sensory Function: Mild impairment Facial Symmetry: Abnormal symmetry right;Suspected CN VII (facial) dysfunction Lingual Symmetry: Within Functional Limits   Ice Chips Ice chips: Not tested   Thin Liquid Thin Liquid: Impaired Presentation: Straw Oral Phase Functional Implications: Oral holding;Prolonged oral transit Pharyngeal  Phase Impairments: Suspected delayed Swallow    Nectar Thick Nectar Thick Liquid: Not tested   Honey Thick Honey Thick Liquid: Not tested   Puree Puree:  (pt declined)   Solid     Solid:  (declined)      Sinai Illingworth Laurice 07/02/2020,3:25 PM   Avarie Tavano L. Tivis Ringer, [place] Office number [phone] Pager [phone]

## 2020-07-02 NOTE — Progress Notes (Addendum)
PROGRESS NOTE  Evan Chavez EHU:[id number] DOB: 01/11/1961 DOA: 05/10/2020 PCP: Rosita Fire, MD  HPI/Recap of past 71 hours: 59 year old male with past medical history of Barrett's esophagus, peptic ulcer disease with a hiatal hernia and duodenal ulcer plus BPH and remote history of substance abuse who presented on 7/29 with recurrent pneumonia and parapneumonic effusion and was admitted to the hospitalist service.  He previously had been seen the day prior in the emergency room for pneumonia, but was stable and sent home and came back for worsening symptoms.  Patient underwent right-sided thoracentesis on 8/2 which was complicated by pneumothorax.  A CT of the scan of the chest done 8/4 noted esophageal perforation arising from the right lateral aspect of mid to distal esophagus and large right-sided hydropneumothorax.  Patient was transferred on 8/4 to [facility] and underwent right sided chest tube placement.  On 8/5, he will underwent a VATS decortication, PEG tube placement and esophageal stent placement.  On 8/7 a repeat esophagram noted persistent leakage from the esophagus to the right pleural space.  On 8/13, patient underwent a right thorascopy with drainage of his loculated pleural effusion, and esophagus gastroscopy and esophageal stent placement.  Follow-up esophagram a few days later did not note any leak.  Patient's hospital course also complicated by development of new onset atrial fibrillation.  Also noted to have left-sided hydronephrosis with plans for shockwave lithotripsy as outpatient.  On 8/21, patient restarted on broad-spectrum antibiotics after x-ray and CT chest noted right lower lobe pneumonia that had developed into sepsis.  CT scan of chest on 9/7 noted interval increase in loculated fluid collection with surrounding enhancing pleura indeterminate by imaging but possibly empyema.  CT surgery reconsulted initially no plans for further surgery or drainage, but  then patient started developing low-grade temperature and white count and CT of abdomen/pelvis noted loculated complex pleural effusion possible abscess, and interventional radiology consulted.  After scheduled CT surgery, patient underwent percutaneous chest tube placement.  MRSA PCR now positive and with persistent right pleural empyema, infectious disease consulted.  Cultures grew out staph aureus.  Patient has been placed on 3 drug regimen of meropenem, fluconazole and vancomycin.  Chest tube continues to have output and plan is for follow-up CT for assessment.  Today, patient is quiet, no acute distress.  Answers questions appropriately by nodding or shaking his head.  Denies any pain or shortness of breath.  Fatigue.  Seems to be in agreement with plan.  Assessment/Plan: Community-acquiredpneumonia with parapneumonic effusion that caused sepsis that was present on admission that developed into hydropneumothorax and recurrent empyema causing acute respiratory failure with hypoxia: Patient initially presented with sepsis on admission given tachycardia, tachypnea, leukocytosis in the setting of pneumonia and organ dysfunction including acute respiratory failure and lactic acidosis.  Sepsis itself initially has been stabilized.  He since then has developed recurrent empyema is currently status post chest tube placement on 9/13 and as per infectious disease on 3 drug regimen of vancomycin, meropenem and fluconazole.  Plan is for repeat CT chest for improvement of empyema and hopefully can remove his chest tube soon.  Esophageal perforation: Initially, CT surgery had planned to remove his esophageal stent last week and that was prior to discovery of his most recent empyema.  We will have them follow-up to decide when it can be removed, likely after his current chest tube is removed and he is completing his current antibiotic course.   Oropharyngealdysphagia: Status post PEG tube placement on  tube  feeds.  Seen by speech therapy and he continues to have episodes of bolus holding.  Was on clear liquids only, but after chest tube placement from recurrent empyema, changed to NPO.  Will have speech therapy reassess as they have not seen him now in approximately 3 weeks.  Paroxysmal atrial fibrillation: Rate controlled, on apixaban and metoprolol. Echocardiogram showed preserved EF without any wall motion abnormality or valvular abnormality.  Had been seen by cardiology.  Chronic diastolic heart failure: Incidentally noted on echocardiogram done 7/30: We will recheck BNP.  Acute on chronic blood lossanemia Hgb stable  Cerebrovascular disease, secondary prevention LFTs from a few days prior, stable.  We will go ahead and resume Lipitor.  Recheck LFTs.  Moderate protein calorie malnutrition Body mass index is 19.6.  Patient meets criteria in the context of chronic illness (his Barrett's esophagus and GERD) as evidenced by moderate fat and muscle depletion.  Currently on Osmolite plus Prosource tube feeds.  Left-sided hydronephrosis with ureteral stone: Initially seen on presentation and urology consulted.  Outpatient follow-up with urology to do lithotripsy   Depressions -ContinueProzac  Code Status: Full code  Family Communication: Declined for me to call anyone  Disposition Plan: Continue hospitalization until chest tube out, infection treated and dietary status set.   Consultants:  Critical care  Cardiothoracic surgery  Interventional radiology  Infectious disease  Urology  Cardiology  Procedures:  Status post thoracentesis done 8/2.  Initial chest tube placement  VATS decortication  PEG tube placement  Esophageal stent placement  Right-sided thoracoscopy with drainage of loculated effusion  second esophageal stent placement  Second chest tube placement  Echocardiogram done 5/95 noting diastolic dysfunction and preserved ejection  fraction  Antimicrobials:  Prior to admission, p.o. amoxicillin/doxycycline  Vancomycin and Zosyn 7/29-8/16  Broad-spectrum antibiotics plus fluconazole 8/8-8/27  Cefepime 9/9-9/13  Vancomycin 9/13-present  Meropenem 9/13-present  Fluconazole 9/13-present  DVT prophylaxis: Eliquis   Objective: Vitals:   07/02/20 0354 07/02/20 0800  BP: 118/82 133/90  Pulse: 82 74  Resp: 20 20  Temp: 97.9 F (36.6 C) 98.3 F (36.8 C)  SpO2: 98% 98%    Intake/Output Summary (Last 24 hours) at 07/02/2020 1030 Last data filed at 07/02/2020 0800 Gross per 24 hour  Intake 1358.91 ml  Output 2150 ml  Net -791.09 ml   Filed Weights   06/29/20 0323 06/30/20 0300 07/02/20 0300  Weight: 59.6 kg 59.7 kg 60.2 kg   Body mass index is 19.6 kg/m.  Exam:   General: Alert and oriented at least x2, no acute distress  HEENT: Normocephalic, atraumatic, mucous membranes are dry  Neck: Supple, no JVD  Cardiovascular: Irregular rhythm, rate controlled  Respiratory: Decreased breath sounds bibasilar  Abdomen: PEG tube in place, hypoactive bowel sounds  Musculoskeletal: No clubbing or cyanosis, 1+ pitting edema  Neuro: No focal deficits  Psychiatry: Appears appropriate, no evidence of acute psychoses   Data Reviewed: CBC: Recent Labs  Lab 06/27/20 0031 06/28/20 0228 06/29/20 0022 07/01/20 0336 07/02/20 0054  WBC 9.2 8.6 6.4 7.0 6.6  NEUTROABS 6.5  --   --  4.1 3.4  HGB 8.3* 8.3* 8.2* 9.4* 8.9*  HCT 29.0* 29.0* 29.2* 33.1* 30.6*  MCV 81.0 80.8 80.2 81.1 80.3  PLT 476* 509* 475* 620* 638*   Basic Metabolic Panel: Recent Labs  Lab 06/27/20 0031 06/28/20 0228 06/29/20 0022 07/01/20 0336 07/02/20 0054  NA 133* 134* 133* 134* 134*  K 4.1 4.6 4.0 4.3 4.0  CL 102 101 100 99  99  CO2 26 26 27 27 27   GLUCOSE 139* 142* 142* 104* 118*  BUN 9 9 10 11 13   CREATININE 0.68 0.63 0.60* 0.67 0.59*  CALCIUM 8.6* 8.8* 8.7* 9.0 8.9   GFR: Estimated Creatinine Clearance: 83.6  mL/min (A) (by C-G formula based on SCr of 0.59 mg/dL (L)). Liver Function Tests: Recent Labs  Lab 06/27/20 0031  AST 38  ALT 124*  ALKPHOS 140*  BILITOT 0.3  PROT 7.3  ALBUMIN 1.9*   No results for input(s): LIPASE, AMYLASE in the last 168 hours. No results for input(s): AMMONIA in the last 168 hours. Coagulation Profile: No results for input(s): INR, PROTIME in the last 168 hours. Cardiac Enzymes: No results for input(s): CKTOTAL, CKMB, CKMBINDEX, TROPONINI in the last 168 hours. BNP (last 3 results) No results for input(s): PROBNP in the last 8760 hours. HbA1C: No results for input(s): HGBA1C in the last 72 hours. CBG: Recent Labs  Lab 07/01/20 0627 07/01/20 1148 07/01/20 1611 07/01/20 2142 07/02/20 0622  GLUCAP 118* 97 117* 101* 131*   Lipid Profile: No results for input(s): CHOL, HDL, LDLCALC, TRIG, CHOLHDL, LDLDIRECT in the last 72 hours. Thyroid Function Tests: No results for input(s): TSH, T4TOTAL, FREET4, T3FREE, THYROIDAB in the last 72 hours. Anemia Panel: No results for input(s): VITAMINB12, FOLATE, FERRITIN, TIBC, IRON, RETICCTPCT in the last 72 hours. Urine analysis:    Component Value Date/Time   COLORURINE YELLOW 06/23/2020 [place] 06/23/2020 1338   LABSPEC 1.019 06/23/2020 1338   PHURINE 6.0 06/23/2020 1338   GLUCOSEU NEGATIVE 06/23/2020 1338   HGBUR NEGATIVE 06/23/2020 1338   BILIRUBINUR NEGATIVE 06/23/2020 1338   BILIRUBINUR NEG 09/22/2018 1527   KETONESUR NEGATIVE 06/23/2020 1338   PROTEINUR NEGATIVE 06/23/2020 1338   UROBILINOGEN 0.2 09/22/2018 1527   UROBILINOGEN 0.2 03/03/2012 0205   NITRITE NEGATIVE 06/23/2020 1338   LEUKOCYTESUR NEGATIVE 06/23/2020 1338   Sepsis Labs: @LABRCNTIP (procalcitonin:4,lacticidven:4)  ) Recent Results (from the past 240 hour(s))  MRSA PCR Screening     Status: Abnormal   Collection Time: 06/23/20 12:30 PM   Specimen: Nasopharyngeal  Result Value Ref Range Status   MRSA by PCR POSITIVE  (A) NEGATIVE Final    Comment:        The GeneXpert MRSA Assay (FDA approved for NASAL specimens only), is one component of a comprehensive MRSA colonization surveillance program. It is not intended to diagnose MRSA infection nor to guide or monitor treatment for MRSA infections. RESULT CALLED TO, READ BACK BY AND VERIFIED WITH: L. LEE RN, AT [id number] 06/23/20 [facility] Performed at [facility], [place] [address], [place], [place] [address]   Aerobic/Anaerobic Culture (surgical/deep wound)     Status: None (Preliminary result)   Collection Time: 06/25/20 10:58 AM   Specimen: Pleura; Abscess  Result Value Ref Range Status   Specimen Description PLEURAL ABSCESS RIGHT  Final   Special Requests NONE  Final   Gram Stain   Final    ABUNDANT WBC PRESENT, PREDOMINANTLY PMN ABUNDANT GRAM POSITIVE COCCI ABUNDANT GRAM VARIABLE ROD Performed at [facility], [address], [place], [place] [address]    Culture   Final    ABUNDANT METHICILLIN RESISTANT STAPHYLOCOCCUS AUREUS ABUNDANT PREVOTELLA DENTICOLA BETA LACTAMASE POSITIVE ABUNDANT STREPTOCOCCUS CONSTELLATUS    Report Status PENDING  Incomplete   Organism ID, Bacteria METHICILLIN RESISTANT STAPHYLOCOCCUS AUREUS  Final      Susceptibility   Methicillin resistant staphylococcus aureus - MIC*    CIPROFLOXACIN >=  8 RESISTANT Resistant     ERYTHROMYCIN >=8 RESISTANT Resistant     GENTAMICIN <=0.5 SENSITIVE Sensitive     OXACILLIN >=4 RESISTANT Resistant     TETRACYCLINE <=1 SENSITIVE Sensitive     VANCOMYCIN <=0.5 SENSITIVE Sensitive     TRIMETH/SULFA <=10 SENSITIVE Sensitive     CLINDAMYCIN <=0.25 SENSITIVE Sensitive     RIFAMPIN <=0.5 SENSITIVE Sensitive     Inducible Clindamycin NEGATIVE Sensitive     * ABUNDANT METHICILLIN RESISTANT STAPHYLOCOCCUS AUREUS      Studies: No results found.  Scheduled Meds: . apixaban  5 mg Per Tube BID  . feeding supplement (PROSource TF)  45 mL Per Tube BID  . FLUoxetine  20 mg Per  Tube Daily  . insulin aspart  0-24 Units Subcutaneous TID AC & HS  . mouth rinse  15 mL Mouth Rinse q12n4p  . metoprolol tartrate  100 mg Per Tube BID  . nystatin  5 mL Mouth/Throat QID  . pantoprazole sodium  40 mg Per Tube BID  . sennosides  5 mL Per Tube QHS  . traZODone  50 mg Per Tube QHS    Continuous Infusions: . sodium chloride 1,000 mL (06/06/20 0028)  . sodium chloride    . feeding supplement (OSMOLITE 1.5 CAL) 60 mL/hr at 07/02/20 0600  . fluconazole (DIFLUCAN) IV 400 mg (07/01/20 1256)  . meropenem (MERREM) IV 1 g (07/02/20 0823)  . vancomycin 750 mg (07/02/20 0569)     LOS: 78 days     Annita Brod, MD [facility]   07/02/2020, 10:30 AM

## 2020-07-02 NOTE — Plan of Care (Signed)

## 2020-07-02 NOTE — Progress Notes (Signed)
Patient ID: Evan Chavez, male   DOB: 04-09-1961, 59 y.o.   MRN: [id number]         [facility]  Date of Admission:  05/10/2020   Total days of antibiotics 11        Day 8 vancomycin        Day 8 meropenem        Day 8 fluconazole ASSESSMENT: He has persistent polymicrobial right pleural empyema.  Recent cultures are growing MRSA and an anaerobe.  Past cultures grew Candida parapsilosis.  I agree with repeat chest CT scanning.  PLAN: 1. Continue current 3 drug antimicrobial regimen  2. Await results of chest CT   Principal Problem:   Pleural empyema (HCC) Active Problems:   Pneumothorax on right   Esophageal perforation   Essential hypertension   Atrial tachycardia (HCC)   Dyslipidemia   Transaminitis   Gastroesophageal reflux disease with esophagitis   Barrett's esophagus   Thyroid nodule   BPH (benign prostatic hyperplasia)   Ureteral stone with hydronephrosis   Acute respiratory failure with hypoxia (HCC)   Malnutrition of moderate degree   Pressure injury of skin   Atrial fibrillation with RVR (HCC)   Right lower lobe pulmonary infiltrate   Dysphagia   Status post thoracentesis   Scheduled Meds: . apixaban  5 mg Per Tube BID  . feeding supplement (PROSource TF)  45 mL Per Tube BID  . FLUoxetine  20 mg Per Tube Daily  . insulin aspart  0-24 Units Subcutaneous TID AC & HS  . mouth rinse  15 mL Mouth Rinse q12n4p  . metoprolol tartrate  100 mg Per Tube BID  . nystatin  5 mL Mouth/Throat QID  . pantoprazole sodium  40 mg Per Tube BID  . sennosides  5 mL Per Tube QHS  . traZODone  50 mg Per Tube QHS   Continuous Infusions: . sodium chloride 1,000 mL (06/06/20 0028)  . sodium chloride    . feeding supplement (OSMOLITE 1.5 CAL) 60 mL/hr at 07/02/20 0600  . fluconazole (DIFLUCAN) IV 400 mg (07/01/20 1256)  . meropenem (MERREM) IV 1 g (07/02/20 0823)  . vancomycin 750 mg (07/02/20 3846)   PRN Meds:.sodium chloride, [CANCELED]  Place/Maintain arterial line **AND** sodium chloride, acetaminophen (TYLENOL) oral liquid 160 mg/5 mL, fentaNYL (SUBLIMAZE) injection, guaiFENesin-dextromethorphan, hydrALAZINE, ipratropium-albuterol, metoprolol tartrate, ondansetron (ZOFRAN) IV, oxyCODONE, phenol, traMADol   SUBJECTIVE: He simply nods "yes" that he is feeling better.  Review of Systems: Review of Systems  Unable to perform ROS: Critical illness    No Known Allergies  OBJECTIVE: Vitals:   07/02/20 0000 07/02/20 0300 07/02/20 0354 07/02/20 0800  BP: 126/87  118/82 133/90  Pulse: 80  82 74  Resp: 18  20 20   Temp: 97.7 F (36.5 C)  97.9 F (36.6 C) 98.3 F (36.8 C)  TempSrc: Oral  Oral Oral  SpO2: 98%  98% 98%  Weight:  60.2 kg    Height:       Body mass index is 19.6 kg/m.  Physical Exam Constitutional:      Comments: He is sitting up in a chair today.   Cardiovascular:     Rate and Rhythm: Normal rate and regular rhythm.     Heart sounds: No murmur heard.   Pulmonary:     Effort: Pulmonary effort is normal.     Comments: Shallow respirations.  No chest tube output recorded yesterday.    Lab Results Lab Results  Component  Value Date   WBC 6.6 07/02/2020   HGB 8.9 (L) 07/02/2020   HCT 30.6 (L) 07/02/2020   MCV 80.3 07/02/2020   PLT 548 (H) 07/02/2020    Lab Results  Component Value Date   CREATININE 0.59 (L) 07/02/2020   BUN 13 07/02/2020   NA 134 (L) 07/02/2020   K 4.0 07/02/2020   CL 99 07/02/2020   CO2 27 07/02/2020    Lab Results  Component Value Date   ALT 124 (H) 06/27/2020   AST 38 06/27/2020   ALKPHOS 140 (H) 06/27/2020   BILITOT 0.3 06/27/2020     Microbiology: Recent Results (from the past 240 hour(s))  MRSA PCR Screening     Status: Abnormal   Collection Time: 06/23/20 12:30 PM   Specimen: Nasopharyngeal  Result Value Ref Range Status   MRSA by PCR POSITIVE (A) NEGATIVE Final    Comment:        The GeneXpert MRSA Assay (FDA approved for NASAL specimens only), is  one component of a comprehensive MRSA colonization surveillance program. It is not intended to diagnose MRSA infection nor to guide or monitor treatment for MRSA infections. RESULT CALLED TO, READ BACK BY AND VERIFIED WITH: L. LEE RN, AT [id number] 06/23/20 [facility] Performed at [facility], [place] [address] [place], [place] [address]   Aerobic/Anaerobic Culture (surgical/deep wound)     Status: None (Preliminary result)   Collection Time: 06/25/20 10:58 AM   Specimen: Pleura; Abscess  Result Value Ref Range Status   Specimen Description PLEURAL ABSCESS RIGHT  Final   Special Requests NONE  Final   Gram Stain   Final    ABUNDANT WBC PRESENT, PREDOMINANTLY PMN ABUNDANT GRAM POSITIVE COCCI ABUNDANT GRAM VARIABLE ROD Performed at [facility], [address], [place], [place] [address]    Culture   Final    ABUNDANT METHICILLIN RESISTANT STAPHYLOCOCCUS AUREUS ABUNDANT PREVOTELLA DENTICOLA BETA LACTAMASE POSITIVE ABUNDANT STREPTOCOCCUS CONSTELLATUS    Report Status PENDING  Incomplete   Organism ID, Bacteria METHICILLIN RESISTANT STAPHYLOCOCCUS AUREUS  Final      Susceptibility   Methicillin resistant staphylococcus aureus - MIC*    CIPROFLOXACIN >=8 RESISTANT Resistant     ERYTHROMYCIN >=8 RESISTANT Resistant     GENTAMICIN <=0.5 SENSITIVE Sensitive     OXACILLIN >=4 RESISTANT Resistant     TETRACYCLINE <=1 SENSITIVE Sensitive     VANCOMYCIN <=0.5 SENSITIVE Sensitive     TRIMETH/SULFA <=10 SENSITIVE Sensitive     CLINDAMYCIN <=0.25 SENSITIVE Sensitive     RIFAMPIN <=0.5 SENSITIVE Sensitive     Inducible Clindamycin NEGATIVE Sensitive     * ABUNDANT METHICILLIN RESISTANT STAPHYLOCOCCUS AUREUS    Michel Bickers, MD [facility] [phone] pager   [phone] cell 07/02/2020, 10:35 AM

## 2020-07-02 NOTE — Progress Notes (Signed)
Pt with order for CT. Needs a new line as per CT. IV team consulted. Pt refused.

## 2020-07-02 NOTE — Progress Notes (Signed)
     [address]       [place] [address]             [phone]       OR tomorrow for stent removal Will assess healing of esophageal performation  Robertine Kipper O Oyuki Hogan

## 2020-07-03 ENCOUNTER — Encounter (HOSPITAL_COMMUNITY): Payer: Self-pay | Admitting: Thoracic Surgery (Cardiothoracic Vascular Surgery)

## 2020-07-03 ENCOUNTER — Inpatient Hospital Stay (HOSPITAL_COMMUNITY): Payer: Medicaid Other | Admitting: Certified Registered Nurse Anesthetist

## 2020-07-03 ENCOUNTER — Inpatient Hospital Stay (HOSPITAL_COMMUNITY): Payer: Medicaid Other

## 2020-07-03 ENCOUNTER — Encounter (HOSPITAL_COMMUNITY)
Admission: EM | Disposition: A | Discharge status: Skilled Nursing Facility | Payer: Self-pay | Source: Home / Self Care | Attending: Thoracic Surgery (Cardiothoracic Vascular Surgery)

## 2020-07-03 DIAGNOSIS — I5032 Chronic diastolic (congestive) heart failure: Secondary | ICD-10-CM

## 2020-07-03 DIAGNOSIS — J869 Pyothorax without fistula: Secondary | ICD-10-CM

## 2020-07-03 HISTORY — PX: ESOPHAGOGASTRODUODENOSCOPY: SHX5428

## 2020-07-03 LAB — GLUCOSE, CAPILLARY
Glucose-Capillary: 97 mg/dL (ref 70–99)
Glucose-Capillary: 95 mg/dL (ref 70–99)
Glucose-Capillary: 125 mg/dL — ABNORMAL HIGH (ref 70–99)
Glucose-Capillary: 99 mg/dL (ref 70–99)
Glucose-Capillary: 125 mg/dL — ABNORMAL HIGH (ref 70–99)

## 2020-07-03 LAB — CBC WITH DIFFERENTIAL/PLATELET
Abs Immature Granulocytes: 0.04 10*3/uL (ref 0.00–0.07)
Basophils Absolute: 0 10*3/uL (ref 0.0–0.1)
Basophils Relative: 1 %
Eosinophils Absolute: 0.1 10*3/uL (ref 0.0–0.5)
Eosinophils Relative: 2 %
HCT: 31.6 % — ABNORMAL LOW (ref 39.0–52.0)
Hemoglobin: 9.2 g/dL — ABNORMAL LOW (ref 13.0–17.0)
Immature Granulocytes: 1 %
Lymphocytes Relative: 36 %
Lymphs Abs: 2.2 10*3/uL (ref 0.7–4.0)
MCH: 23.4 pg — ABNORMAL LOW (ref 26.0–34.0)
MCHC: 29.1 g/dL — ABNORMAL LOW (ref 30.0–36.0)
MCV: 80.2 fL (ref 80.0–100.0)
Monocytes Absolute: 0.6 10*3/uL (ref 0.1–1.0)
Monocytes Relative: 10 %
Neutro Abs: 3 10*3/uL (ref 1.7–7.7)
Neutrophils Relative %: 50 %
Platelets: 558 10*3/uL — ABNORMAL HIGH (ref 150–400)
RBC: 3.94 MIL/uL — ABNORMAL LOW (ref 4.22–5.81)
RDW: 16.7 % — ABNORMAL HIGH (ref 11.5–15.5)
WBC: 6 10*3/uL (ref 4.0–10.5)
nRBC: 0 % (ref 0.0–0.2)

## 2020-07-03 LAB — BASIC METABOLIC PANEL
Anion gap: 10 (ref 5–15)
BUN: 15 mg/dL (ref 6–20)
CO2: 25 mmol/L (ref 22–32)
Calcium: 8.8 mg/dL — ABNORMAL LOW (ref 8.9–10.3)
Chloride: 99 mmol/L (ref 98–111)
Creatinine, Ser: 0.66 mg/dL (ref 0.61–1.24)
GFR calc Af Amer: >60 mL/min (ref >60)
GFR calc non Af Amer: >60 mL/min (ref >60)
Glucose, Bld: 100 mg/dL — ABNORMAL HIGH (ref 70–99)
Potassium: 4.1 mmol/L (ref 3.5–5.1)
Sodium: 134 mmol/L — ABNORMAL LOW (ref 135–145)

## 2020-07-03 SURGERY — EGD (ESOPHAGOGASTRODUODENOSCOPY)
Anesthesia: General | Site: Esophagus

## 2020-07-03 MED ORDER — SUGAMMADEX SODIUM 200 MG/2ML IV SOLN
INTRAVENOUS | Status: DC | PRN
  Administered 2020-07-03: 120 mg via INTRAVENOUS

## 2020-07-03 MED ORDER — ROCURONIUM BROMIDE 10 MG/ML (PF) SYRINGE
PREFILLED_SYRINGE | INTRAVENOUS | Status: AC
  Filled 2020-07-03: qty 10

## 2020-07-03 MED ORDER — DEXAMETHASONE SODIUM PHOSPHATE 10 MG/ML IJ SOLN
INTRAMUSCULAR | Status: AC
  Filled 2020-07-03: qty 1

## 2020-07-03 MED ORDER — IOHEXOL 300 MG/ML  SOLN
INTRAMUSCULAR | Status: DC | PRN
  Administered 2020-07-03: 50 mL

## 2020-07-03 MED ORDER — PROPOFOL 10 MG/ML IV BOLUS
INTRAVENOUS | Status: DC | PRN
  Administered 2020-07-03: 150 mg via INTRAVENOUS

## 2020-07-03 MED ORDER — MIDAZOLAM HCL 2 MG/2ML IJ SOLN
INTRAMUSCULAR | Status: DC | PRN
  Administered 2020-07-03: 2 mg via INTRAVENOUS

## 2020-07-03 MED ORDER — PROTAMINE SULFATE 10 MG/ML IV SOLN
INTRAVENOUS | Status: AC
  Filled 2020-07-03: qty 25

## 2020-07-03 MED ORDER — CHLORHEXIDINE GLUCONATE 0.12 % MT SOLN
OROMUCOSAL | Status: AC
  Administered 2020-07-03: 15 mL via OROMUCOSAL
  Filled 2020-07-03: qty 15

## 2020-07-03 MED ORDER — ROCURONIUM BROMIDE 10 MG/ML (PF) SYRINGE
PREFILLED_SYRINGE | INTRAVENOUS | Status: DC | PRN
  Administered 2020-07-03: 30 mg via INTRAVENOUS

## 2020-07-03 MED ORDER — ONDANSETRON HCL 4 MG/2ML IJ SOLN
INTRAMUSCULAR | Status: AC
  Filled 2020-07-03: qty 2

## 2020-07-03 MED ORDER — LIDOCAINE 2% (20 MG/ML) 5 ML SYRINGE
INTRAMUSCULAR | Status: AC
  Filled 2020-07-03: qty 10

## 2020-07-03 MED ORDER — PROTAMINE SULFATE 10 MG/ML IV SOLN
INTRAVENOUS | Status: AC
  Filled 2020-07-03: qty 5

## 2020-07-03 MED ORDER — 0.9 % SODIUM CHLORIDE (POUR BTL) OPTIME
TOPICAL | Status: DC | PRN
  Administered 2020-07-03: 1000 mL

## 2020-07-03 MED ORDER — MIDAZOLAM HCL 2 MG/2ML IJ SOLN
INTRAMUSCULAR | Status: AC
  Filled 2020-07-03: qty 2

## 2020-07-03 MED ORDER — FENTANYL CITRATE (PF) 100 MCG/2ML IJ SOLN
INTRAMUSCULAR | Status: DC | PRN
  Administered 2020-07-03 (×2): 50 ug via INTRAVENOUS

## 2020-07-03 MED ORDER — SUCCINYLCHOLINE CHLORIDE 200 MG/10ML IV SOSY
PREFILLED_SYRINGE | INTRAVENOUS | Status: DC | PRN
  Administered 2020-07-03: 30 mg via INTRAVENOUS
  Administered 2020-07-03: 140 mg via INTRAVENOUS

## 2020-07-03 MED ORDER — CHLORHEXIDINE GLUCONATE 0.12 % MT SOLN: 15.0000 mL | Freq: Once | OROMUCOSAL | Status: AC

## 2020-07-03 MED ORDER — ONDANSETRON HCL 4 MG/2ML IJ SOLN
INTRAMUSCULAR | Status: DC | PRN
  Administered 2020-07-03: 4 mg via INTRAVENOUS

## 2020-07-03 MED ORDER — FENTANYL CITRATE (PF) 250 MCG/5ML IJ SOLN
INTRAMUSCULAR | Status: AC
  Filled 2020-07-03: qty 5

## 2020-07-03 MED ORDER — LIDOCAINE 2% (20 MG/ML) 5 ML SYRINGE
INTRAMUSCULAR | Status: DC | PRN
  Administered 2020-07-03: 60 mg via INTRAVENOUS

## 2020-07-03 MED ORDER — FENTANYL CITRATE (PF) 100 MCG/2ML IJ SOLN: 25.0000 ug | INTRAMUSCULAR | Status: DC | PRN

## 2020-07-03 MED ORDER — ALBUMIN HUMAN 5 % IV SOLN: INTRAVENOUS | Status: DC | PRN

## 2020-07-03 MED ORDER — PROPOFOL 10 MG/ML IV BOLUS
INTRAVENOUS | Status: AC
  Filled 2020-07-03: qty 20

## 2020-07-03 MED ORDER — LACTATED RINGERS IV SOLN: INTRAVENOUS | Status: DC

## 2020-07-03 SURGICAL SUPPLY — 26 items
BLADE CLIPPER SURG (BLADE) IMPLANT
CANISTER SUCT 3000ML PPV (MISCELLANEOUS) ×3 IMPLANT
CNTNR URN SCR LID CUP LEK RST (MISCELLANEOUS) ×2 IMPLANT
CONT SPEC 4OZ STRL OR WHT (MISCELLANEOUS) ×4
COVER BACK TABLE 60X90IN (DRAPES) ×3 IMPLANT
DRAPE TABLE BACK 80X90 (DRAPES) ×3 IMPLANT
FORCEPS GRASP COMBO 8X230 (FORCEP) ×3 IMPLANT
GAUZE SPONGE 4X4 12PLY STRL (GAUZE/BANDAGES/DRESSINGS) ×3 IMPLANT
GLOVE BIO SURGEON STRL SZ7 (GLOVE) ×3 IMPLANT
GLOVE BIO SURGEON STRL SZ7.5 (GLOVE) IMPLANT
GOWN STRL REUS W/ TWL XL LVL3 (GOWN DISPOSABLE) ×1 IMPLANT
GOWN STRL REUS W/TWL XL LVL3 (GOWN DISPOSABLE) ×2
KIT TURNOVER KIT B (KITS) ×3 IMPLANT
MARKER SKIN DUAL TIP RULER LAB (MISCELLANEOUS) ×3 IMPLANT
NS IRRIG 1000ML POUR BTL (IV SOLUTION) ×3 IMPLANT
OIL SILICONE PENTAX (PARTS (SERVICE/REPAIRS)) ×3 IMPLANT
PAD ARMBOARD 7.5X6 YLW CONV (MISCELLANEOUS) ×6 IMPLANT
SYR 20ML ECCENTRIC (SYRINGE) ×3 IMPLANT
SYR 30ML SLIP (SYRINGE) ×3 IMPLANT
TOWEL GREEN STERILE (TOWEL DISPOSABLE) ×3 IMPLANT
TOWEL GREEN STERILE FF (TOWEL DISPOSABLE) ×3 IMPLANT
TUBE CONNECTING 20'X1/4 (TUBING) ×1
TUBE CONNECTING 20X1/4 (TUBING) ×2 IMPLANT
TUBING ENDO SMARTCAP (MISCELLANEOUS) ×3 IMPLANT
UNDERPAD 30X36 HEAVY ABSORB (UNDERPADS AND DIAPERS) ×3 IMPLANT
WATER STERILE IRR 1000ML POUR (IV SOLUTION) ×3 IMPLANT

## 2020-07-03 NOTE — Transfer of Care (Signed)
Immediate Anesthesia Transfer of Care Note  Patient: Evan Chavez  Procedure(s) Performed: ESOPHAGOGASTRODUODENOSCOPY (EGD), esophageal stent removal.  Esophagram via [place] fluoroscopy.  (N/A Esophagus)  Patient Location: PACU  Anesthesia Type:General  Level of Consciousness: awake, alert  and oriented  Airway & Oxygen Therapy: Patient Spontanous Breathing and Patient connected to face mask oxygen  Post-op Assessment: Report given to RN, Post -op Vital signs reviewed and stable and Patient moving all extremities  Post vital signs: Reviewed and stable  Last Vitals:  Vitals Value Taken Time  BP 124/83 07/03/20 1402  Temp    Pulse 81 07/03/20 1406  Resp 18 07/03/20 1406  SpO2 99 % 07/03/20 1406  Vitals shown include unvalidated device data.  Last Pain:  Vitals:   07/03/20 0722  TempSrc: Oral  PainSc:       Patients Stated Pain Goal: 0 (83/33/83 2919)  Complications: No complications documented.

## 2020-07-03 NOTE — Progress Notes (Signed)
PT Cancellation Note  Patient Details Name: Evan Chavez MRN: [id number] DOB: 09-17-1961   Cancelled Treatment:    Reason Eval/Treat Not Completed: Patient at procedure or test/unavailable.    Shary Decamp [facility] 07/03/2020, 12:07 PM [place] Pager [phone] Office [phone]

## 2020-07-03 NOTE — Anesthesia Postprocedure Evaluation (Signed)
Anesthesia Post Note  Patient: Evan Chavez  Procedure(s) Performed: ESOPHAGOGASTRODUODENOSCOPY (EGD), esophageal stent removal.  Esophagram via [place] fluoroscopy.  (N/A Esophagus)     Patient location during evaluation: PACU Anesthesia Type: General Level of consciousness: awake Pain management: pain level controlled Vital Signs Assessment: post-procedure vital signs reviewed and stable Respiratory status: spontaneous breathing Cardiovascular status: stable Postop Assessment: no apparent nausea or vomiting Anesthetic complications: no   No complications documented.  Last Vitals:  Vitals:   07/03/20 1430 07/03/20 1445  BP: 111/78 113/76  Pulse: 75 73  Resp: 18 16  Temp:    SpO2: 94% 93%    Last Pain:  Vitals:   07/03/20 1430  TempSrc:   PainSc: Asleep                 Kimerly Rowand

## 2020-07-03 NOTE — Plan of Care (Signed)

## 2020-07-03 NOTE — Anesthesia Preprocedure Evaluation (Addendum)
Anesthesia Evaluation  Patient identified by MRN, date of birth, ID band Patient awake    Reviewed: Allergy & Precautions, NPO status , Patient's Chart, lab work & pertinent test results  Airway Mallampati: II  TM Distance: >3 FB     Dental   Pulmonary pneumonia, former smoker,    breath sounds clear to auscultation       Cardiovascular hypertension, +CHF   Rhythm:Regular     Neuro/Psych    GI/Hepatic PUD, GERD  ,  Endo/Other    Renal/GU Renal disease     Musculoskeletal   Abdominal   Peds  Hematology  (+) anemia ,   Anesthesia Other Findings   Reproductive/Obstetrics                           Anesthesia Physical Anesthesia Plan  ASA: III  Anesthesia Plan: General   Post-op Pain Management:    Induction: Intravenous  PONV Risk Score and Plan: 3 and Ondansetron, Dexamethasone and Midazolam  Airway Management Planned: Oral ETT  Additional Equipment:   Intra-op Plan:   Post-operative Plan: Possible Post-op intubation/ventilation  Informed Consent: I have reviewed the patients History and Physical, chart, labs and discussed the procedure including the risks, benefits and alternatives for the proposed anesthesia with the patient or authorized representative who has indicated his/her understanding and acceptance.     Dental advisory given  Plan Discussed with: CRNA and Anesthesiologist  Anesthesia Plan Comments:         Anesthesia Quick Evaluation

## 2020-07-03 NOTE — Progress Notes (Signed)
PROGRESS NOTE  Evan Chavez QZR:[id number] DOB: 1961-03-27 DOA: 05/10/2020 PCP: Rosita Fire, MD  HPI/Recap of past 67 hours: 59 year old male with past medical history of Barrett's esophagus, peptic ulcer disease with a hiatal hernia and duodenal ulcer plus BPH and remote history of substance abuse who presented on 7/29 with recurrent pneumonia and parapneumonic effusion and was admitted to the hospitalist service.  He previously had been seen the day prior in the emergency room for pneumonia, but was stable and sent home and came back for worsening symptoms.  Patient underwent right-sided thoracentesis on 8/2 which was complicated by pneumothorax.  A CT of the scan of the chest done 8/4 noted esophageal perforation arising from the right lateral aspect of mid to distal esophagus and large right-sided hydropneumothorax.  Patient was transferred on 8/4 to [facility] and underwent right sided chest tube placement.  On 8/5, he will underwent a VATS decortication, PEG tube placement and esophageal stent placement.  On 8/7 a repeat esophagram noted persistent leakage from the esophagus to the right pleural space.  On 8/13, patient underwent a right thorascopy with drainage of his loculated pleural effusion, and esophagus gastroscopy and esophageal stent placement.  Follow-up esophagram a few days later did not note any leak.  Patient's hospital course also complicated by development of new onset atrial fibrillation.  Also noted to have left-sided hydronephrosis with plans for shockwave lithotripsy as outpatient.  On 8/21, patient restarted on broad-spectrum antibiotics after x-ray and CT chest noted right lower lobe pneumonia that had developed into sepsis.  CT scan of chest on 9/7 noted interval increase in loculated fluid collection with surrounding enhancing pleura indeterminate by imaging but possibly empyema.  CT surgery reconsulted initially no plans for further surgery or drainage, but  then patient started developing low-grade temperature and white count and CT of abdomen/pelvis noted loculated complex pleural effusion possible abscess, and interventional radiology consulted.  After scheduled CT surgery, patient underwent percutaneous chest tube placement.  MRSA PCR now positive and with persistent right pleural empyema, infectious disease consulted.  Cultures grew out staph aureus.  Patient has been placed on 3 drug regimen of meropenem, fluconazole and vancomycin.  Patient underwent follow-up CT on 9/20 noting very little fluid remaining.  No events overnight.  Patient had follow-up by speech therapy and mostly noncooperative during evaluation.  Declined all offers of p.o. eventually excepting a few sips of water.  Patient will be going today to the OR to have esophageal stent removed per cardiothoracic surgery.  Likely at some point soon will have chest tube removed as well.  Patient himself with no complaints.  Assessment/Plan: Community-acquiredpneumonia with parapneumonic effusion that caused sepsis that was present on admission that developed into hydropneumothorax and recurrent empyema causing acute respiratory failure with hypoxia: Patient initially presented with sepsis on admission given tachycardia, tachypnea, leukocytosis in the setting of pneumonia and organ dysfunction including acute respiratory failure and lactic acidosis.  Sepsis itself initially has been stabilized.  He since then has developed recurrent empyema is currently status post chest tube placement on 9/13 and as per infectious disease on 3 drug regimen of vancomycin, meropenem and fluconazole.  Follow-up CT notes minimal fluid remaining.  Chest tube to be removed soon.  With CT findings, ID can recommend when antibiotics can be stopped.  Esophageal perforation: Initially, CT surgery had planned to remove his esophageal stent last week and that was prior to discovery of his most recent empyema.  Esophageal  stent to  be removed today.   Oropharyngealdysphagia: Status post PEG tube placement on tube feeds.  Seen by speech therapy and he continues to have episodes of bolus holding.  Was on clear liquids only, but after chest tube placement from recurrent empyema, changed to NPO.  Unfortunately, continues to take little in terms of p.o. and direction.  Remain on tube feeds only.  Paroxysmal atrial fibrillation: Rate controlled, on apixaban and metoprolol. Echocardiogram showed preserved EF without any wall motion abnormality or valvular abnormality.  Had been seen by cardiology.  Chronic diastolic heart failure: Incidentally noted on echocardiogram done 7/30: We will recheck BNP.  Acute on chronic blood lossanemia Hgb stable  Cerebrovascular disease, secondary prevention LFTs stable, Lipitor resumed.   Moderate protein calorie malnutrition Body mass index is 19.6.  Patient meets criteria in the context of chronic illness (his Barrett's esophagus and GERD) as evidenced by moderate fat and muscle depletion.  Currently on Osmolite plus Prosource tube feeds.  Left-sided hydronephrosis with ureteral stone: Initially seen on presentation and urology consulted.  Outpatient follow-up with urology to do lithotripsy   Depressions -ContinueProzac  Code Status: Full code  Family Communication: Declined for me to call anyone  Disposition Plan: Continue hospitalization until chest tube out, cleared by CT surgery, esophageal stent out and antibiotic course finalized.   Consultants:  Critical care  Cardiothoracic surgery  Interventional radiology  Infectious disease  Urology  Cardiology  Procedures:  Status post thoracentesis done 8/2.  Initial chest tube placement  VATS decortication  PEG tube placement  Esophageal stent placement  Right-sided thoracoscopy with drainage of loculated effusion  second esophageal stent placement  Second chest tube  placement  Echocardiogram done 4/03 noting diastolic dysfunction and preserved ejection fraction  Antimicrobials:  Prior to admission, p.o. amoxicillin/doxycycline  Vancomycin and Zosyn 7/29-8/16  Broad-spectrum antibiotics plus fluconazole 8/8-8/27  Cefepime 9/9-9/13  Vancomycin 9/13-present  Meropenem 9/13-present  Fluconazole 9/13-present  DVT prophylaxis: Eliquis   Objective: Vitals:   07/03/20 0322 07/03/20 0722  BP: 131/89 125/84  Pulse: 80 82  Resp: 20 19  Temp: 97.9 F (36.6 C) 98.2 F (36.8 C)  SpO2: 97% 97%    Intake/Output Summary (Last 24 hours) at 07/03/2020 1006 Last data filed at 07/03/2020 0726 Gross per 24 hour  Intake 1020 ml  Output 1630 ml  Net -610 ml   Filed Weights   06/30/20 0300 07/02/20 0300 07/03/20 0322  Weight: 59.7 kg 60.2 kg 61.1 kg   Body mass index is 19.89 kg/m.  Exam:   General: Alert and oriented at least x2, no acute distress  HEENT: Normocephalic, atraumatic, mucous membranes are dry  Neck: Supple, no JVD  Cardiovascular: Irregular rhythm, rate controlled  Respiratory: Decreased breath sounds bibasilar  Abdomen: PEG tube in place, hypoactive bowel sounds  Musculoskeletal: No clubbing or cyanosis, 1+ pitting edema  Neuro: No focal deficits  Psychiatry: Appears appropriate, no evidence of acute psychoses   Data Reviewed: CBC: Recent Labs  Lab 06/27/20 0031 06/27/20 0031 06/28/20 0228 06/29/20 0022 07/01/20 0336 07/02/20 0054 07/03/20 0133  WBC 9.2   < > 8.6 6.4 7.0 6.6 6.0  NEUTROABS 6.5  --   --   --  4.1 3.4 3.0  HGB 8.3*   < > 8.3* 8.2* 9.4* 8.9* 9.2*  HCT 29.0*   < > 29.0* 29.2* 33.1* 30.6* 31.6*  MCV 81.0   < > 80.8 80.2 81.1 80.3 80.2  PLT 476*   < > 509* 475* 620* 548* 558*   < > =  values in this interval not displayed.   Basic Metabolic Panel: Recent Labs  Lab 06/28/20 0228 06/29/20 0022 07/01/20 0336 07/02/20 0054 07/03/20 0133  NA 134* 133* 134* 134* 134*  K 4.6 4.0 4.3 4.0  4.1  CL 101 100 99 99 99  CO2 26 27 27 27 25   GLUCOSE 142* 142* 104* 118* 100*  BUN 9 10 11 13 15   CREATININE 0.63 0.60* 0.67 0.59* 0.66  CALCIUM 8.8* 8.7* 9.0 8.9 8.8*   GFR: Estimated Creatinine Clearance: 84.9 mL/min (by C-G formula based on SCr of 0.66 mg/dL). Liver Function Tests: Recent Labs  Lab 06/27/20 0031 07/02/20 0054  AST 38 29  ALT 124* 80*  ALKPHOS 140* 110  BILITOT 0.3 0.3  PROT 7.3 7.4  ALBUMIN 1.9* 2.2*   No results for input(s): LIPASE, AMYLASE in the last 168 hours. No results for input(s): AMMONIA in the last 168 hours. Coagulation Profile: No results for input(s): INR, PROTIME in the last 168 hours. Cardiac Enzymes: No results for input(s): CKTOTAL, CKMB, CKMBINDEX, TROPONINI in the last 168 hours. BNP (last 3 results) No results for input(s): PROBNP in the last 8760 hours. HbA1C: No results for input(s): HGBA1C in the last 72 hours. CBG: Recent Labs  Lab 07/01/20 2142 07/02/20 0622 07/02/20 1603 07/02/20 2107 07/03/20 0600  GLUCAP 101* 131* 97 126* 97   Lipid Profile: No results for input(s): CHOL, HDL, LDLCALC, TRIG, CHOLHDL, LDLDIRECT in the last 72 hours. Thyroid Function Tests: No results for input(s): TSH, T4TOTAL, FREET4, T3FREE, THYROIDAB in the last 72 hours. Anemia Panel: No results for input(s): VITAMINB12, FOLATE, FERRITIN, TIBC, IRON, RETICCTPCT in the last 72 hours. Urine analysis:    Component Value Date/Time   COLORURINE YELLOW 06/23/2020 [place] 06/23/2020 1338   LABSPEC 1.019 06/23/2020 1338   PHURINE 6.0 06/23/2020 1338   GLUCOSEU NEGATIVE 06/23/2020 1338   HGBUR NEGATIVE 06/23/2020 1338   BILIRUBINUR NEGATIVE 06/23/2020 1338   BILIRUBINUR NEG 09/22/2018 1527   KETONESUR NEGATIVE 06/23/2020 1338   PROTEINUR NEGATIVE 06/23/2020 1338   UROBILINOGEN 0.2 09/22/2018 1527   UROBILINOGEN 0.2 03/03/2012 0205   NITRITE NEGATIVE 06/23/2020 1338   LEUKOCYTESUR NEGATIVE 06/23/2020 1338   Sepsis  Labs: @LABRCNTIP (procalcitonin:4,lacticidven:4)  ) Recent Results (from the past 240 hour(s))  MRSA PCR Screening     Status: Abnormal   Collection Time: 06/23/20 12:30 PM   Specimen: Nasopharyngeal  Result Value Ref Range Status   MRSA by PCR POSITIVE (A) NEGATIVE Final    Comment:        The GeneXpert MRSA Assay (FDA approved for NASAL specimens only), is one component of a comprehensive MRSA colonization surveillance program. It is not intended to diagnose MRSA infection nor to guide or monitor treatment for MRSA infections. RESULT CALLED TO, READ BACK BY AND VERIFIED WITH: L. LEE RN, AT [id number] 06/23/20 [place] Performed at [facility], [place] [address] [place], [place] [address]   Aerobic/Anaerobic Culture (surgical/deep wound)     Status: None   Collection Time: 06/25/20 10:58 AM   Specimen: Pleura; Abscess  Result Value Ref Range Status   Specimen Description PLEURAL ABSCESS RIGHT  Final   Special Requests NONE  Final   Gram Stain   Final    ABUNDANT WBC PRESENT, PREDOMINANTLY PMN ABUNDANT GRAM POSITIVE COCCI ABUNDANT GRAM VARIABLE ROD Performed at [facility], [address], [place], [place] [address]    Culture   Final    ABUNDANT METHICILLIN  RESISTANT STAPHYLOCOCCUS AUREUS ABUNDANT PREVOTELLA DENTICOLA BETA LACTAMASE POSITIVE ABUNDANT STREPTOCOCCUS CONSTELLATUS    Report Status 07/02/2020 FINAL  Final   Organism ID, Bacteria METHICILLIN RESISTANT STAPHYLOCOCCUS AUREUS  Final      Susceptibility   Methicillin resistant staphylococcus aureus - MIC*    CIPROFLOXACIN >=8 RESISTANT Resistant     ERYTHROMYCIN >=8 RESISTANT Resistant     GENTAMICIN <=0.5 SENSITIVE Sensitive     OXACILLIN >=4 RESISTANT Resistant     TETRACYCLINE <=1 SENSITIVE Sensitive     VANCOMYCIN <=0.5 SENSITIVE Sensitive     TRIMETH/SULFA <=10 SENSITIVE Sensitive     CLINDAMYCIN <=0.25 SENSITIVE Sensitive     RIFAMPIN <=0.5 SENSITIVE Sensitive     Inducible Clindamycin  NEGATIVE Sensitive     * ABUNDANT METHICILLIN RESISTANT STAPHYLOCOCCUS AUREUS      Studies: CT CHEST W CONTRAST  Result Date: 07/02/2020 CLINICAL DATA:  59 year old with community acquired pneumonia and parapneumonic effusion. History of recurrent empyema. Recent CT-guided chest tube placement on 06/25/2020. EXAM: CT CHEST WITH CONTRAST TECHNIQUE: Multidetector CT imaging of the chest was performed during intravenous contrast administration. CONTRAST:  109mL OMNIPAQUE IOHEXOL 300 MG/ML  SOLN COMPARISON:  CT chest 06/19/2020 FINDINGS: Cardiovascular: Normal caliber of the thoracic aorta. Limited evaluation of the pulmonary arteries due to the phase of contrast. Heart size is normal without significant pericardial fluid. Mediastinum/Nodes: Again noted is an esophageal stent involving the mid and distal esophagus. There is some debris within the stent. Proximal esophagus is distended with gas. 1.1 cm low-density left thyroid nodule. Soft tissue in the right subcarinal region measures roughly 1.0 cm on sequence 3, image 82. No significant mediastinal or hilar lymphadenopathy. No significant axillary lymph node enlargement. Lungs/Pleura: A pigtail chest tube has been placed in the posterior right chest and the large pleural-based collection has resolved. There is no significant fluid around the drain. Small amount of residual right pleural fluid. Stable focal pleural thickening along the posterior right upper chest. No significant left pleural fluid.Material in the dependent aspect of the trachea and right airways could represent mucus or aspirated material. Markedly improved aeration in the right lower lobe since the exam on 06/19/2020. There continues to be volume loss in the right lower lobe. Left lung is clear. Upper Abdomen: Again noted is left hydronephrosis but this is incompletely evaluated. Hyperdense or enhancing structure at the hepatic dome measures roughly 8 mm and this may represent a flash filling  hemangioma but indeterminate. No acute abnormality in the upper abdomen. Musculoskeletal: No acute bone abnormality. IMPRESSION: 1. Loculated right pleural collection has resolved since placement of the pigtail chest tube drain. No significant residual fluid in this area. There is a small residual right pleural effusion. 2. Improved aeration in the right lung. 3. Material in the trachea and right lower lung airways could be related to mucous but cannot exclude aspiration based on the dilated esophagus and esophageal stent. 4. Indeterminate hyperdense or hypervascular structure the hepatic dome measuring roughly 8 mm. This could represent a flash filling hemangioma but indeterminate. This is likely an incidental finding. 5. Partial visualization of the left hydronephrosis. Electronically Signed   By: Markus Daft M.D.   On: 07/02/2020 13:20    Scheduled Meds: . apixaban  5 mg Per Tube BID  . atorvastatin  40 mg Per Tube Daily  . feeding supplement (PROSource TF)  45 mL Per Tube BID  . FLUoxetine  20 mg Per Tube Daily  . insulin aspart  0-24 Units Subcutaneous TID AC &  HS  . mouth rinse  15 mL Mouth Rinse q12n4p  . metoprolol tartrate  100 mg Per Tube BID  . nystatin  5 mL Mouth/Throat QID  . pantoprazole sodium  40 mg Per Tube BID  . sennosides  5 mL Per Tube QHS  . traZODone  50 mg Per Tube QHS    Continuous Infusions: . sodium chloride 1,000 mL (06/06/20 0028)  . sodium chloride    . feeding supplement (OSMOLITE 1.5 CAL) Stopped (07/03/20 0000)  . fluconazole (DIFLUCAN) IV 400 mg (07/02/20 1718)  . meropenem (MERREM) IV 1 g (07/03/20 0805)  . vancomycin 750 mg (07/03/20 0634)     LOS: 7 days     Annita Brod, MD [facility]   07/03/2020, 10:06 AM

## 2020-07-03 NOTE — Anesthesia Procedure Notes (Signed)
Procedure Name: Intubation Date/Time: 07/03/2020 1:18 PM Performed by: Amadeo Garnet, CRNA Pre-anesthesia Checklist: Patient identified, Emergency Drugs available, Suction available and Patient being monitored Patient Re-evaluated:Patient Re-evaluated prior to induction Oxygen Delivery Method: Circle system utilized Preoxygenation: Pre-oxygenation with 100% oxygen Induction Type: IV induction and Rapid sequence Ventilation: Unable to mask ventilate Laryngoscope Size: Mac and 4 Grade View: Grade I Tube type: Oral Tube size: 7.5 mm Number of attempts: 1 Airway Equipment and Method: Stylet Placement Confirmation: ETT inserted through vocal cords under direct vision,  positive ETCO2 and breath sounds checked- equal and bilateral Secured at: 22 cm Tube secured with: Tape Dental Injury: Teeth and Oropharynx as per pre-operative assessment

## 2020-07-03 NOTE — Progress Notes (Signed)
     [address]       [place] [address]             [phone]       No events CT shows minimal fluid  OR today for stent removal  Evan Chavez Brigida Scotti

## 2020-07-03 NOTE — Progress Notes (Signed)
Nutrition Follow-up  DOCUMENTATION CODES:   Non-severe (moderate) malnutrition in context of chronic illness  INTERVENTION:   Once cleared to resume tube feedings after OR visit, restart tube feeds via PEG: -Osmolite 1.5@60ml/hr(1440 ml/day) - ProSource TF 45 ml BID -Free water flushes per MD  Tube feeding regimenprovides2240kcal,112grams of protein, and1097ml of H2O.  NUTRITION DIAGNOSIS:   Moderate Malnutrition related to chronic illness (Barrett's esophagus, GERD) as evidenced by moderate fat depletion, moderate muscle depletion.  Ongoing  GOAL:   Patient will meet greater than or equal to 90% of their needs  Met via TF  MONITOR:   Diet advancement, Labs, Weight trends, TF tolerance, Skin, I & O's  REASON FOR ASSESSMENT:   Consult Enteral/tube feeding initiation and management  ASSESSMENT:   58-year-old male who presented on 7/28 with fever, SOB. PMH of stroke, HLD, GERD, Barrett's esophagus, PUD, s/p balloon stricturoplasty for GE junction in 2020. Admitted with severe sepsis secondary to bilateral pleural effusions with concomitant bibasilar pneumonia.  8/02 - MBSS with recommendation for regular diet with thin liquids, s/p thoracentesis 8/04 - s/p chest tube insertion, chest CT showing esophageal perforation 8/05 - s/p VATS, esophageal stent placement, PEG tube placement 8/13- s/pright thoracoscopy, drainage of loculated pleural effusion, esophagogastroscopy,esophageal stent placement, esophagram with Omnipaque 8/16 - swallow study showing no leak 8/19 - diet advanced to clear liquids 8/25 - FEES showing no observable oropharyngeal dysphagia (other than oral aversion) with recommendation for thin liquids 9/13 - s/p right chest tube placement (empyema vs pulmonary abscess); nystatin ordered for suspected oral thrush  Noted plan for OR today for stent removal.  SLP recommending allowing POs per pt's preferences if he is able to make those known.  Due to pt's poor participation, SLP has signed off.  TF was stopped at midnight for OR today.  Admit weight: 67.5 kg Current weight: 61.1 kg  Weight trending up over the last week. Will continue to monitor trends. Per RN edema assessment, pt with "clubbing of fingers."  Attempted to speak with pt. However, pt sleeping soundly at time of RD visit and did not awaken to RD voice. Will plan to continue tube feeds at currently rate after trip to OR today.  Current TF orders: Osmolite 1.5 @ 60 ml/hr, ProSource TF 45 ml BID  Medications reviewed and include: SSI QID, nystatin, protonix, senokot, IV fluconazole, IV abx  Labs reviewed: sodium 134, hemoglobin 9.2 CBG's: 97-126 x 24 hours  UOP: 1350 ml x 24 hours CT: 30 ml x 24 hours  Diet Order:   Diet Order            Diet NPO time specified  Diet effective now           Diet - low sodium heart healthy                 EDUCATION NEEDS:   Education needs have been addressed  Skin:  Skin Assessment: Skin Integrity Issues: Stage II: right buttocks Incisions: right chest Other: skin tear to left buttocks  Last BM:  07/01/20  Height:   Ht Readings from Last 1 Encounters:  05/17/20 5' 9" (1.753 m)    Weight:   Wt Readings from Last 1 Encounters:  07/03/20 61.1 kg    Ideal Body Weight:  72.7 kg  BMI:  Body mass index is 19.89 kg/m.  Estimated Nutritional Needs:   Kcal:  2000-2200  Protein:  100-115 grams  Fluid:  >/= 2.0 L    Kate Jablonski ,   MS, RD, LDN Inpatient Clinical Dietitian Please see AMiON for contact information.  

## 2020-07-03 NOTE — Op Note (Signed)
      [address]       [place] [address]             [phone]        07/03/2020  Patient:  Evan Chavez Pre-Op Dx: History of esophageal perforation Post-op Dx: Same Procedure: - Esophagogastroscopy - Removal of esophageal stent - esophagram with Omnipaque   Surgeon and Role:      * Jerelyn Trimarco, Lucile Crater, MD - Primary  Anesthesia  general EBL: 0 ml Blood Administration: None Specimen: None   Counts: correct   Indications: Evan Chavez is a 59 year old gentleman that was previously treated with a covered esophageal stent for esophageal perforation that occurred over a month ago.  He originally presented to [facility] with what was thought to be a pneumonia with a loculated pleural effusion but imaging later revealed that he had an esophageal perforation.  He was taken to the operating theater for a decortication and esophageal stent placement along with a PEG tube.  The esophageal stent had to be repositioned due to poor seal 1 week later.  This occurred on 05/25/2020.  Since repositioning he has had a good seal, and was advanced to a liquid diet.  He has been resistant to try p.o. over the past month due to dysphagia but has been tolerating full dose tube feeds.  He comes to the operating theater today for stent removal and reassessment.  Findings: The esophageal stent was removed without incident.  On evaluation the mucosal surface of his distal esophagus appeared to well-healed.  There was a small bruise at 35 cm from the incisors which is close to the area of perforation noted on previous endoscopy.  Omnipaque was injected and there was no obvious leak.  The contrast did not empty directly into the stomach but the patient remained in the supine position for this.  Operative Technique: After the risks, benefits and alternatives were thoroughly discussed, the patient was brought to the operative theatre.  Anesthesia was induced. The patient was  prepped and draped in normal sterile fashion.  An appropriate surgical pause was performed, and pre-operative antibiotics were dosed accordingly.  The gastroscope was advanced through the oropharynx into the cervical esophagus under direct visualization.  The previous stent was visualized, and using an endoscopic grasper the stitch was grasped, and the stent was removed along with the scope.  We passed the scope back down into the stomach and on retrograde examination there was no obvious mucosal defects in the esophagus.  The area noted in previous endoscopy corresponded with the perforation appeared well-healed.  Omnipaque contrast was injected directly through the EGD and we visualized the esophagus distend fluoroscopy.  There was no obvious extravasation of contrast.  We allow the contrast to do well for about 5 minutes and did not see any leak into the right pleural space.  The contrast was then aspirated out of the esophagus and the scope was removed.  The patient tolerated the procedure without any immediate complications, and was transferred to the PACU in stable condition.  Evan Chavez

## 2020-07-04 ENCOUNTER — Encounter (HOSPITAL_COMMUNITY): Payer: Self-pay | Admitting: Thoracic Surgery (Cardiothoracic Vascular Surgery)

## 2020-07-04 ENCOUNTER — Inpatient Hospital Stay (HOSPITAL_COMMUNITY): Payer: Medicaid Other

## 2020-07-04 LAB — CBC WITH DIFFERENTIAL/PLATELET
Abs Immature Granulocytes: 0.02 10*3/uL (ref 0.00–0.07)
Basophils Absolute: 0 10*3/uL (ref 0.0–0.1)
Basophils Relative: 1 %
Eosinophils Absolute: 0.1 10*3/uL (ref 0.0–0.5)
Eosinophils Relative: 2 %
HCT: 28.8 % — ABNORMAL LOW (ref 39.0–52.0)
Hemoglobin: 8.4 g/dL — ABNORMAL LOW (ref 13.0–17.0)
Immature Granulocytes: 0 %
Lymphocytes Relative: 32 %
Lymphs Abs: 1.8 10*3/uL (ref 0.7–4.0)
MCH: 23.5 pg — ABNORMAL LOW (ref 26.0–34.0)
MCHC: 29.2 g/dL — ABNORMAL LOW (ref 30.0–36.0)
MCV: 80.4 fL (ref 80.0–100.0)
Monocytes Absolute: 0.6 10*3/uL (ref 0.1–1.0)
Monocytes Relative: 10 %
Neutro Abs: 3.2 10*3/uL (ref 1.7–7.7)
Neutrophils Relative %: 55 %
Platelets: 470 10*3/uL — ABNORMAL HIGH (ref 150–400)
RBC: 3.58 MIL/uL — ABNORMAL LOW (ref 4.22–5.81)
RDW: 16.7 % — ABNORMAL HIGH (ref 11.5–15.5)
WBC: 5.7 10*3/uL (ref 4.0–10.5)
nRBC: 0 % (ref 0.0–0.2)

## 2020-07-04 LAB — BASIC METABOLIC PANEL
Anion gap: 7 (ref 5–15)
BUN: 14 mg/dL (ref 6–20)
CO2: 27 mmol/L (ref 22–32)
Calcium: 8.6 mg/dL — ABNORMAL LOW (ref 8.9–10.3)
Chloride: 101 mmol/L (ref 98–111)
Creatinine, Ser: 0.69 mg/dL (ref 0.61–1.24)
GFR calc Af Amer: >60 mL/min (ref >60)
GFR calc non Af Amer: >60 mL/min (ref >60)
Glucose, Bld: 128 mg/dL — ABNORMAL HIGH (ref 70–99)
Potassium: 4 mmol/L (ref 3.5–5.1)
Sodium: 135 mmol/L (ref 135–145)

## 2020-07-04 LAB — GLUCOSE, CAPILLARY
Glucose-Capillary: 136 mg/dL — ABNORMAL HIGH (ref 70–99)
Glucose-Capillary: 105 mg/dL — ABNORMAL HIGH (ref 70–99)
Glucose-Capillary: 95 mg/dL (ref 70–99)
Glucose-Capillary: 114 mg/dL — ABNORMAL HIGH (ref 70–99)

## 2020-07-04 NOTE — Progress Notes (Signed)
Patient ID: Evan Chavez, male   DOB: Nov 23, 1960, 59 y.o.   MRN: [id number]         [facility] for Infectious Disease  Date of Admission:  05/10/2020   Total days of antibiotics 13        Day 10 vancomycin        Day 10 meropenem        Day 10 fluconazole ASSESSMENT:   PLAN: 1. Continue current 3 drug antimicrobial regimen for 4 more days   Principal Problem:   Pleural empyema ([place]) Active Problems:   Pneumothorax on right   Esophageal perforation   Essential hypertension   Atrial tachycardia (HCC)   Dyslipidemia   Transaminitis   Gastroesophageal reflux disease with esophagitis   Barrett's esophagus   Thyroid nodule   BPH (benign prostatic hyperplasia)   Ureteral stone with hydronephrosis   Acute respiratory failure with hypoxia (HCC)   Malnutrition of moderate degree   Pressure injury of skin   Atrial fibrillation with RVR (HCC)   Right lower lobe pulmonary infiltrate   Dysphagia   Status post thoracentesis   Chronic diastolic CHF (congestive heart failure) (HCC)   Scheduled Meds: . apixaban  5 mg Per Tube BID  . atorvastatin  40 mg Per Tube Daily  . feeding supplement (PROSource TF)  45 mL Per Tube BID  . FLUoxetine  20 mg Per Tube Daily  . insulin aspart  0-24 Units Subcutaneous TID AC & HS  . mouth rinse  15 mL Mouth Rinse q12n4p  . metoprolol tartrate  100 mg Per Tube BID  . nystatin  5 mL Mouth/Throat QID  . pantoprazole sodium  40 mg Per Tube BID  . sennosides  5 mL Per Tube QHS  . traZODone  50 mg Per Tube QHS   Continuous Infusions: . sodium chloride 1,000 mL (06/06/20 0028)  . sodium chloride    . feeding supplement (OSMOLITE 1.5 CAL) 1,000 mL (07/03/20 1742)  . fluconazole (DIFLUCAN) IV 400 mg (07/02/20 1718)  . meropenem (MERREM) IV 1 g (07/04/20 0908)  . vancomycin 750 mg (07/04/20 0755)   PRN Meds:.sodium chloride, [CANCELED] Place/Maintain arterial line **AND** sodium chloride, acetaminophen (TYLENOL) oral liquid 160 mg/5 mL,  fentaNYL (SUBLIMAZE) injection, guaiFENesin-dextromethorphan, hydrALAZINE, ipratropium-albuterol, metoprolol tartrate, ondansetron (ZOFRAN) IV, oxyCODONE, phenol, traMADol   SUBJECTIVE: He simply nods "yes" that he is feeling better.  He does not speak.  Review of Systems: Review of Systems  Unable to perform ROS: Language    No Known Allergies  OBJECTIVE: Vitals:   07/04/20 0500 07/04/20 0728 07/04/20 1054 07/04/20 1100  BP: 121/80 122/77 116/76 129/81  Pulse: 78 70  69  Resp: 17 20  20   Temp: 98.7 F (37.1 C) 98.8 F (37.1 C)  98.4 F (36.9 C)  TempSrc: Oral Oral  Oral  SpO2: 95% 97%  96%  Weight: 60.4 kg     Height:       Body mass index is 19.65 kg/m.  Physical Exam Constitutional:      Comments: He is sitting up in a chair.  He appears to be in fairly good spirits.  Cardiovascular:     Rate and Rhythm: Normal rate and regular rhythm.     Heart sounds: No murmur heard.   Pulmonary:     Effort: Pulmonary effort is normal.     Comments: Shallow respirations.  No chest tube output recorded yesterday but there is about 300 cc of milky white fluid in his canister.  Lab Results Lab Results  Component Value Date   WBC 5.7 07/04/2020   HGB 8.4 (L) 07/04/2020   HCT 28.8 (L) 07/04/2020   MCV 80.4 07/04/2020   PLT 470 (H) 07/04/2020    Lab Results  Component Value Date   CREATININE 0.69 07/04/2020   BUN 14 07/04/2020   NA 135 07/04/2020   K 4.0 07/04/2020   CL 101 07/04/2020   CO2 27 07/04/2020    Lab Results  Component Value Date   ALT 80 (H) 07/02/2020   AST 29 07/02/2020   ALKPHOS 110 07/02/2020   BILITOT 0.3 07/02/2020     Microbiology: Recent Results (from the past 240 hour(s))  Aerobic/Anaerobic Culture (surgical/deep wound)     Status: None   Collection Time: 06/25/20 10:58 AM   Specimen: Pleura; Abscess  Result Value Ref Range Status   Specimen Description PLEURAL ABSCESS RIGHT  Final   Special Requests NONE  Final   Gram Stain   Final      ABUNDANT WBC PRESENT, PREDOMINANTLY PMN ABUNDANT GRAM POSITIVE COCCI ABUNDANT GRAM VARIABLE ROD Performed at [facility], [address], [place], [place]  [address]    Culture   Final    ABUNDANT METHICILLIN RESISTANT STAPHYLOCOCCUS AUREUS ABUNDANT PREVOTELLA DENTICOLA BETA LACTAMASE POSITIVE ABUNDANT STREPTOCOCCUS CONSTELLATUS    Report Status 07/02/2020 FINAL  Final   Organism ID, Bacteria METHICILLIN RESISTANT STAPHYLOCOCCUS AUREUS  Final      Susceptibility   Methicillin resistant staphylococcus aureus - MIC*    CIPROFLOXACIN >=8 RESISTANT Resistant     ERYTHROMYCIN >=8 RESISTANT Resistant     GENTAMICIN <=0.5 SENSITIVE Sensitive     OXACILLIN >=4 RESISTANT Resistant     TETRACYCLINE <=1 SENSITIVE Sensitive     VANCOMYCIN <=0.5 SENSITIVE Sensitive     TRIMETH/SULFA <=10 SENSITIVE Sensitive     CLINDAMYCIN <=0.25 SENSITIVE Sensitive     RIFAMPIN <=0.5 SENSITIVE Sensitive     Inducible Clindamycin NEGATIVE Sensitive     * ABUNDANT METHICILLIN RESISTANT STAPHYLOCOCCUS AUREUS    Michel Bickers, MD [facility] for [facility] [phone] pager   [phone] cell 07/04/2020, 12:32 PM

## 2020-07-04 NOTE — Progress Notes (Addendum)
      [address]       [place] [address]             [phone]      1 Day Post-Op Procedure(s) (LRB): ESOPHAGOGASTRODUODENOSCOPY (EGD), esophageal stent removal.  Esophagram via [place] fluoroscopy.  (N/A) Subjective: Feels okay this morning. Pain is well controlled.   Objective: Vital signs in last 24 hours: Temp:  [97.9 F (36.6 C)-98.8 F (37.1 C)] 98.8 F (37.1 C) (09/22 0728) Pulse Rate:  [70-93] 70 (09/22 0728) Cardiac Rhythm: Normal sinus rhythm (09/21 1415) Resp:  [15-24] 20 (09/22 0728) BP: (110-124)/(72-83) 122/77 (09/22 0728) SpO2:  [92 %-99 %] 97 % (09/22 0728) Weight:  [60.4 kg] 60.4 kg (09/22 0500)     Intake/Output from previous day: 09/21 0701 - 09/22 0700 In: 850 [I.V.:600; IV Piggyback:250] Out: 250 [Urine:250] Intake/Output this shift: No intake/output data recorded.  General appearance: alert, cooperative and no distress Heart: regular rate and rhythm, S1, S2 normal, no murmur, click, rub or gallop Lungs: clear to auscultation bilaterally Abdomen: soft, non-tender; bowel sounds normal; no masses,  no organomegaly Extremities: extremities normal, atraumatic, no cyanosis or edema Wound: clean and dry  Lab Results: Recent Labs    07/03/20 0133 07/04/20 0149  WBC 6.0 5.7  HGB 9.2* 8.4*  HCT 31.6* 28.8*  PLT 558* 470*   BMET:  Recent Labs    07/03/20 0133 07/04/20 0149  NA 134* 135  K 4.1 4.0  CL 99 101  CO2 25 27  GLUCOSE 100* 128*  BUN 15 14  CREATININE 0.66 0.69  CALCIUM 8.8* 8.6*    PT/INR: No results for input(s): LABPROT, INR in the last 72 hours. ABG    Component Value Date/Time   PHART 7.443 05/26/2020 0335   HCO3 23.5 05/26/2020 0335   TCO2 27 05/17/2020 2036   ACIDBASEDEF 0.1 05/26/2020 0335   O2SAT 96.7 05/26/2020 0335   CBG (last 3)  Recent Labs    07/03/20 1627 07/03/20 1949 07/04/20 0617  GLUCAP 99 125* 136*    Assessment/Plan: S/P Procedure(s)  (LRB): ESOPHAGOGASTRODUODENOSCOPY (EGD), esophageal stent removal.  Esophagram via [place] fluoroscopy.  (N/A)  1. Esophageal stent removal- remains NPO with TF at 60.  2. Chest tube in place with 350cc of white creamy drainage. Tolerating room air with good oxygen saturation.  3. Pain is well controlled at this time. Continue current regimen 4. Continue Vanc and Merrem for pleural empyema. No recent fevers.  5. NSR in the 70s, BP well controlled. WBC 5.7  Plan: Continue chest tube for now. Continue medical care. Encouraged ambulation and use of incentive spirometer/flutter valve.      LOS: 54 days    Evan Chavez 07/04/2020   Pt refusing to take PO.  Swallow aborted Continue tube feeds Continue CT drainage  Evan Chavez Evan Chavez

## 2020-07-04 NOTE — Progress Notes (Signed)
Referring Physician(s): Lightfoot, Lucile Crater (TCTS)  Supervising Physician: Sandi Mariscal  Patient Status:  [facility] - In-pt  Chief Complaint: None  Subjective:  History of esophageal perforation complicated by development of left empyema s/p left chest tube placement in IR 06/25/2020. Patient laying in bed resting comfortably. He opens eyes to voice, does not speak but nods to yes/no questions appropriately. Left chest tube site c/d/i. Sating 97% on RA.   Allergies: Patient has no known allergies.  Medications: Prior to Admission medications   Medication Sig Start Date End Date Taking? Authorizing Provider  acetaminophen (TYLENOL) 500 MG tablet Take 1,000 mg by mouth every 6 (six) hours as needed (for pain).   Yes [provider]  amantadine (SYMMETREL) 100 MG capsule Take 100 mg by mouth 2 (two) times daily.    Yes [provider]  amoxicillin (AMOXIL) 500 MG capsule Take 2 capsules (1,000 mg total) by mouth 3 (three) times daily. 05/09/20  Yes Fransico Meadow, PA-C  aspirin 325 MG tablet Take 1 tablet (325 mg total) by mouth daily. 07/16/18  Yes Barton Dubois, MD  atorvastatin (LIPITOR) 40 MG tablet Take 40 mg by mouth daily.   Yes [provider]  benzonatate (TESSALON) 100 MG capsule Take 100 mg by mouth 3 (three) times daily. 05/02/20  Yes [provider]  diltiazem (CARDIZEM CD) 120 MG 24 hr capsule Take 1 capsule (120 mg total) by mouth daily. Patient taking differently: Take 120 mg by mouth daily after breakfast.  09/22/18  Yes Soyla Dryer, PA-C  doxycycline (VIBRA-TABS) 100 MG tablet Take 1 tablet (100 mg total) by mouth 2 (two) times daily. 05/09/20  Yes Fransico Meadow, PA-C  Ferrous Sulfate (IRON PO) Take 65 mg by mouth 2 (two) times daily with a meal.   Yes [provider]  loratadine (CLARITIN) 10 MG tablet Take 10 mg by mouth daily. 05/02/20  Yes [provider]  Multiple Vitamin (MULTIVITAMIN WITH MINERALS) TABS  tablet Take 1 tablet by mouth daily after breakfast.    Yes [provider]  pantoprazole (PROTONIX) 40 MG tablet Take 1 tablet (40 mg total) by mouth 2 (two) times daily before a meal. 09/22/18  Yes Soyla Dryer, PA-C  sulfamethoxazole-trimethoprim (BACTRIM DS) 800-160 MG tablet Take 1 tablet by mouth 2 (two) times daily. 5 day course prescribed on 05/04/2020 05/04/20  Yes [provider]  tamsulosin (FLOMAX) 0.4 MG CAPS capsule Take 0.4 mg by mouth daily. 05/04/20  Yes [provider]  apixaban (ELIQUIS) 5 MG TABS tablet Take 1 tablet (5 mg total) by mouth 2 (two) times daily. 06/22/20   Hosie Poisson, MD  diltiazem (CARDIZEM) 120 MG tablet Take 120 mg by mouth daily. Patient not taking: Reported on 05/11/2020 05/02/20   [provider]  FLUoxetine (PROZAC) 20 MG capsule Place 1 capsule (20 mg total) into feeding tube daily. 06/22/20   Hosie Poisson, MD  insulin aspart (NOVOLOG) 100 UNIT/ML injection Inject 0-24 Units into the skin 4 (four) times daily -  before meals and at bedtime. 06/22/20   Hosie Poisson, MD  ipratropium-albuterol (DUONEB) 0.5-2.5 (3) MG/3ML SOLN Take 3 mLs by nebulization every 4 (four) hours as needed. 06/22/20   Hosie Poisson, MD  metoprolol tartrate (LOPRESSOR) 25 mg/10 mL SUSP Place 40 mLs (100 mg total) into feeding tube 2 (two) times daily. 06/22/20   Hosie Poisson, MD  Nutritional Supplements (FEEDING SUPPLEMENT, OSMOLITE 1.5 CAL,) LIQD Place 1,000 mLs into feeding tube continuous. 06/22/20  Hosie Poisson, MD  Nutritional Supplements (FEEDING SUPPLEMENT, PROSOURCE TF,) liquid Place 45 mLs into feeding tube 2 (two) times daily. 06/22/20   Hosie Poisson, MD  sennosides (SENOKOT) 8.8 MG/5ML syrup Place 5 mLs into feeding tube at bedtime. 06/22/20   Hosie Poisson, MD  traZODone (DESYREL) 50 MG tablet Place 1 tablet (50 mg total) into feeding tube at bedtime. 06/22/20   Hosie Poisson, MD     Vital Signs: BP 129/81 (BP Location: Right Arm)    Pulse 69   Temp 98.4 F (36.9 C) (Oral)   Resp 20   Ht 5' 9.02" (1.753 m)   Wt 133 lb 2.5 oz (60.4 kg)   SpO2 96%   BMI 19.65 kg/m   Physical Exam Vitals and nursing note reviewed.  Constitutional:      General: He is not in acute distress. Pulmonary:     Effort: Pulmonary effort is normal. No respiratory distress.     Comments: Left chest tube site without erythema, drainage, or active bleeding; approximately 400 cc thick white fluid in pleure-vac; tube to suction with (-) air leak. Skin:    General: Skin is warm and dry.     Imaging: CT CHEST W CONTRAST  Result Date: 07/02/2020 CLINICAL DATA:  59 year old with community acquired pneumonia and parapneumonic effusion. History of recurrent empyema. Recent CT-guided chest tube placement on 06/25/2020. EXAM: CT CHEST WITH CONTRAST TECHNIQUE: Multidetector CT imaging of the chest was performed during intravenous contrast administration. CONTRAST:  23mL OMNIPAQUE IOHEXOL 300 MG/ML  SOLN COMPARISON:  CT chest 06/19/2020 FINDINGS: Cardiovascular: Normal caliber of the thoracic aorta. Limited evaluation of the pulmonary arteries due to the phase of contrast. Heart size is normal without significant pericardial fluid. Mediastinum/Nodes: Again noted is an esophageal stent involving the mid and distal esophagus. There is some debris within the stent. Proximal esophagus is distended with gas. 1.1 cm low-density left thyroid nodule. Soft tissue in the right subcarinal region measures roughly 1.0 cm on sequence 3, image 82. No significant mediastinal or hilar lymphadenopathy. No significant axillary lymph node enlargement. Lungs/Pleura: A pigtail chest tube has been placed in the posterior right chest and the large pleural-based collection has resolved. There is no significant fluid around the drain. Small amount of residual right pleural fluid. Stable focal pleural thickening along the posterior right upper chest. No significant left pleural  fluid.Material in the dependent aspect of the trachea and right airways could represent mucus or aspirated material. Markedly improved aeration in the right lower lobe since the exam on 06/19/2020. There continues to be volume loss in the right lower lobe. Left lung is clear. Upper Abdomen: Again noted is left hydronephrosis but this is incompletely evaluated. Hyperdense or enhancing structure at the hepatic dome measures roughly 8 mm and this may represent a flash filling hemangioma but indeterminate. No acute abnormality in the upper abdomen. Musculoskeletal: No acute bone abnormality. IMPRESSION: 1. Loculated right pleural collection has resolved since placement of the pigtail chest tube drain. No significant residual fluid in this area. There is a small residual right pleural effusion. 2. Improved aeration in the right lung. 3. Material in the trachea and right lower lung airways could be related to mucous but cannot exclude aspiration based on the dilated esophagus and esophageal stent. 4. Indeterminate hyperdense or hypervascular structure the hepatic dome measuring roughly 8 mm. This could represent a flash filling hemangioma but indeterminate. This is likely an incidental finding. 5. Partial visualization of the left hydronephrosis. Electronically Signed  By: Markus Daft M.D.   On: 07/02/2020 13:20   DG Fluoro Rm 1-60 Min  Result Date: 07/04/2020 CLINICAL DATA:  Esophageal anastomotic leak. EXAM: DG C-ARM 1-60 MIN CONTRAST:  Patient did not swallow contrast. FLUOROSCOPY TIME:  Radiation Exposure Index (if provided by the fluoroscopic device): 4.2 mGy. COMPARISON:  July 02, 2020. FINDINGS: An attempt was made to perform an esophagram. However, the patient declined to drink contrast despite repeated attempts to convince him to do so. The procedure was discontinued and the patient was returned to the floor. IMPRESSION: Unsuccessful attempt to perform esophagram as patient declined to drink contrast.  Electronically Signed   By: Marijo Conception M.D.   On: 07/04/2020 10:12   HYBRID OR IMAGING ([facility] ONLY)  Result Date: 07/03/2020 There is no interpretation for this exam.  This order is for images obtained during a surgical procedure.  Please See "Surgeries" Tab for more information regarding the procedure.    Labs:  CBC: Recent Labs    07/01/20 0336 07/02/20 0054 07/03/20 0133 07/04/20 0149  WBC 7.0 6.6 6.0 5.7  HGB 9.4* 8.9* 9.2* 8.4*  HCT 33.1* 30.6* 31.6* 28.8*  PLT 620* 548* 558* 470*    COAGS: Recent Labs    05/10/20 2111 05/11/20 0536 06/02/20 1240 06/02/20 1524  INR 1.1 1.1 1.2 1.2  APTT 30 31  --  35    BMP: Recent Labs    07/01/20 0336 07/02/20 0054 07/03/20 0133 07/04/20 0149  NA 134* 134* 134* 135  K 4.3 4.0 4.1 4.0  CL 99 99 99 101  CO2 27 27 25 27   GLUCOSE 104* 118* 100* 128*  BUN 11 13 15 14   CALCIUM 9.0 8.9 8.8* 8.6*  CREATININE 0.67 0.59* 0.66 0.69  GFRNONAA >60 >60 >60 >60  GFRAA >60 >60 >60 >60    LIVER FUNCTION TESTS: Recent Labs    06/20/20 0801 06/23/20 0700 06/27/20 0031 07/02/20 0054  BILITOT 0.4 0.4 0.3 0.3  AST 61* 45* 38 29  ALT 198* 158* 124* 80*  ALKPHOS 145* 144* 140* 110  PROT 7.4 7.3 7.3 7.4  ALBUMIN 2.2* 2.0* 1.9* 2.2*    Assessment and Plan:  History of esophageal perforation complicated by development of left empyema s/p left chest tube placement in IR 06/25/2020. Left chest tube stable with approximately 400 cc thick white fluid in pleure-vac. Continue current tube management- tube to remain to suction at this time. Further plans per TCTS- appreciate and agree with management. IR to follow.   Electronically Signed: Earley Abide, PA-C 07/04/2020, 2:31 PM   I spent a total of 15 Minutes at the the patient's bedside AND on the patient's hospital floor or unit, greater than 50% of which was counseling/coordinating care for left empyema s/p left chest tube placement.

## 2020-07-04 NOTE — Progress Notes (Signed)
Physical Therapy Treatment Patient Details Name: Evan Chavez MRN: [id number] DOB: [birth year] Today's Date: 07/04/2020    History of Present Illness Pt is 59 yo male presents to the hospital on 7/28 with generalized weakness, productive cough and low-grade fever. Pt is currently being treated for hydropneumothorax, Sepsis aspiration pneumonia and esophagal rupture, AKI and acute delirium. s/p R thoracentesis on 8/2. Chest tube placed 8/2-8/9. s/p VATS, decortication, EGD, PEG tube placement, and a esophageal stent on 8/9. PMH of GERD, Barrett's esophagus, PUD with extensive gastric erosion, duodenal ulcer and esophageal stricture, stroke, HLD, BPH, cocaine abuse. s/p Right thoracoscopy, chest tube placement, esophageal stent placement 8/13-8/20. 9/13- s/p rt chest tube placement. 9/21 pt underwent EGD with esophageal stent removal    PT Comments    Pt remains flat during session, appears to have limited motivation for participating in PT session. Pt continues to demonstrate limited activity tolerance at this time. Pt will benefit from aggressive mobilization and PT POC to improve activity tolerance and to improve pulmonary function. PT continues to recommend SNF placement at this time as the pt continues to require physical assistance and has very poor tolerance for mobility.   Follow Up Recommendations  SNF     Equipment Recommendations  3in1 (PT);Rolling walker with 5" wheels    Recommendations for Other Services       Precautions / Restrictions Precautions Precautions: Fall;Other (comment) Precaution Comments: PEG, right weakness, R chest tube Restrictions Weight Bearing Restrictions: No    Mobility  Bed Mobility Overal bed mobility: Needs Assistance Bed Mobility: Rolling;Sidelying to Sit Rolling: Supervision Sidelying to sit: Supervision          Transfers Overall transfer level: Needs assistance Equipment used: Rolling walker (2 wheeled) Transfers: Sit to/from  [facility] Sit to Stand: Min guard Stand pivot transfers: Min guard          Ambulation/Gait Ambulation/Gait assistance: Min guard Gait Distance (Feet): 40 Feet Assistive device: Rolling walker (2 wheeled) Gait Pattern/deviations: Step-to pattern;Trunk flexed Gait velocity: reduced Gait velocity interpretation: <1.8 ft/sec, indicate of risk for recurrent falls General Gait Details: pt with short step to gait with slight increase in trunk flexion, reduced step length bilaterally   Stairs             Wheelchair Mobility    Modified Rankin (Stroke Patients Only)       Balance Overall balance assessment: Needs assistance Sitting-balance support: Single extremity supported;Feet supported Sitting balance-Leahy Scale: Poor Sitting balance - Comments: reliant on UE support of bed   Standing balance support: Bilateral upper extremity supported Standing balance-Leahy Scale: Poor Standing balance comment: reliant on BUE support of RW                            Cognition Arousal/Alertness: Awake/alert Behavior During Therapy: Flat affect Overall Cognitive Status: No family/caregiver present to determine baseline cognitive functioning Area of Impairment: Problem solving                             Problem Solving: Slow processing        Exercises      General Comments General comments (skin integrity, edema, etc.): pt on RA, pulse ox alarming however with poor waveform and pt expressing no dyspnea, no observed increased work of breathing      Pertinent Vitals/Pain Pain Assessment: Faces Faces Pain Scale: Hurts little more Pain Location: abdomen  Pain Descriptors / Indicators: Aching Pain Intervention(s): Monitored during session    Home Living                      Prior Function            PT Goals (current goals can now be found in the care plan section) Acute Rehab PT Goals Patient Stated Goal: to get  dry, pt condom cath found off and bed wet upon PT arrival Time For Goal Achievement: 07/18/20 Progress towards PT goals: Not progressing toward goals - comment (activity tolerance remains limited)    Frequency    Min 2X/week      PT Plan Current plan remains appropriate    Co-evaluation              AM-PAC PT "6 Clicks" Mobility   Outcome Measure  Help needed turning from your back to your side while in a flat bed without using bedrails?: None Help needed moving from lying on your back to sitting on the side of a flat bed without using bedrails?: None Help needed moving to and from a bed to a chair (including a wheelchair)?: A Little Help needed standing up from a chair using your arms (e.g., wheelchair or bedside chair)?: A Little Help needed to walk in hospital room?: A Little Help needed climbing 3-5 steps with a railing? : A Lot 6 Click Score: 19    End of Session   Activity Tolerance: Patient tolerated treatment well Patient left: in chair;with call bell/phone within reach;with chair alarm set Nurse Communication: Mobility status PT Visit Diagnosis: Unsteadiness on feet (R26.81);Muscle weakness (generalized) (M62.81)     Time: 2263-3354 PT Time Calculation (min) (ACUTE ONLY): 23 min  Charges:  $Gait Training: 8-22 mins $Therapeutic Activity: 8-22 mins                     Zenaida Niece, PT, DPT Acute Rehabilitation Pager: [phone]    Zenaida Niece 07/04/2020, 12:17 PM

## 2020-07-04 NOTE — Progress Notes (Signed)
PROGRESS NOTE  Evan Chavez QIW:[id number] DOB: 13-Aug-1961 DOA: 05/10/2020 PCP: Rosita Fire, MD  HPI/Recap of past 69 hours: 59 year old male with past medical history of Barrett's esophagus, peptic ulcer disease with a hiatal hernia and duodenal ulcer plus BPH and remote history of substance abuse who presented on 7/29 with recurrent pneumonia and parapneumonic effusion and was admitted to the hospitalist service.  He previously had been seen the day prior in the emergency room for pneumonia, but was stable and sent home and came back for worsening symptoms.  Patient underwent right-sided thoracentesis on 8/2 which was complicated by pneumothorax.  A CT of the scan of the chest done 8/4 noted esophageal perforation arising from the right lateral aspect of mid to distal esophagus and large right-sided hydropneumothorax.  Patient was transferred on 8/4 to [facility] and underwent right sided chest tube placement.  On 8/5, he will underwent a VATS decortication, PEG tube placement and esophageal stent placement.  On 8/7 a repeat esophagram noted persistent leakage from the esophagus to the right pleural space.  On 8/13, patient underwent a right thorascopy with drainage of his loculated pleural effusion, and esophagus gastroscopy and esophageal stent placement.  Follow-up esophagram a few days later did not note any leak.  Patient's hospital course also complicated by development of new onset atrial fibrillation.  Also noted to have left-sided hydronephrosis with plans for shockwave lithotripsy as outpatient.  On 8/21, patient restarted on broad-spectrum antibiotics after x-ray and CT chest noted right lower lobe pneumonia that had developed into sepsis.  CT scan of chest on 9/7 noted interval increase in loculated fluid collection with surrounding enhancing pleura indeterminate by imaging but possibly empyema.  CT surgery reconsulted initially no plans for further surgery or drainage, but  then patient started developing low-grade temperature and white count and CT of abdomen/pelvis noted loculated complex pleural effusion possible abscess, and interventional radiology consulted.  After scheduled CT surgery, patient underwent percutaneous chest tube placement.  MRSA PCR now positive and with persistent right pleural empyema, infectious disease consulted.  Cultures grew out staph aureus.  Patient has been placed on 3 drug regimen of meropenem, fluconazole and vancomycin.  Patient underwent follow-up CT on 9/20 noting very little fluid remaining.  No events overnight.  Patient had follow-up by speech therapy and mostly noncooperative during evaluation.  Declined all offers of p.o. eventually excepting a few sips of water.  On 9/21, cardiothoracic surgery removed esophageal stent.  Follow-up esophagram noted patent esophagus with no signs of leakage.   Seen in recovery.  No apparent distress.  Assessment/Plan: Community-acquiredpneumonia with parapneumonic effusion that caused sepsis that was present on admission that developed into hydropneumothorax and recurrent empyema causing acute respiratory failure with hypoxia: Patient initially presented with sepsis on admission given tachycardia, tachypnea, leukocytosis in the setting of pneumonia and organ dysfunction including acute respiratory failure and lactic acidosis.  Sepsis itself initially has been stabilized.  He since then has developed recurrent empyema is currently status post chest tube placement on 9/13 and as per infectious disease on 3 drug regimen of vancomycin, meropenem and fluconazole.  Follow-up CT notes minimal fluid remaining, although chest tube today does note some whitish drainage.  With CT findings, ID can recommend when antibiotics can be stopped.  Esophageal perforation: Initially, CT surgery had planned to remove his esophageal stent last week and that was prior to discovery of his most recent empyema.  Esophageal  stent removed 9/21.  Esophagram following notes no  leakage.  Oropharyngealdysphagia: Status post PEG tube placement on tube feeds.  Seen by speech therapy and he continues to have episodes of bolus holding.  Was on clear liquids only, but after chest tube placement from recurrent empyema, changed to NPO.  Unfortunately, continues to take little in terms of p.o. and direction.  Remain on tube feeds only.  Paroxysmal atrial fibrillation: Continues to remain rate controlled.  Continue apixaban and metoprolol. Echocardiogram showed preserved EF without any wall motion abnormality or valvular abnormality.  Had been seen by cardiology.  Chronic diastolic heart failure: Incidentally noted on echocardiogram done 7/30.  BNP on 9/20 at 25.  Remains euvolemic.  Acute on chronic blood lossanemia Hgb stable.  Today at 8.4.  Cerebrovascular disease, secondary prevention LFTs stable, Lipitor resumed.   Moderate protein calorie malnutrition Body mass index is 19.6.  Patient meets criteria in the context of chronic illness (his Barrett's esophagus and GERD) as evidenced by moderate fat and muscle depletion.  Being followed by nutrition.  Currently on Osmolite plus Prosource tube feeds.  Left-sided hydronephrosis with ureteral stone: Initially seen on presentation and urology consulted.  Outpatient follow-up with urology to do lithotripsy   Depressions -ContinueProzac  Code Status: Full code  Family Communication: Declined for me to call anyone.  His mom has been by to visit.  Disposition Plan: Continue hospitalization until chest tube out and antibiotic course finalized   Consultants:  Critical care  Cardiothoracic surgery  Interventional radiology  Infectious disease  Urology  Nutrition  Cardiology  Procedures:  Status post thoracentesis done 8/2.  Initial chest tube placement  VATS decortication  PEG tube placement  Esophageal stent placement  Right-sided  thoracoscopy with drainage of loculated effusion  second esophageal stent placement with removal on 9/22  Second chest tube placement  Echocardiogram done 3/66 noting diastolic dysfunction and preserved ejection fraction  Antimicrobials:  Prior to admission, p.o. amoxicillin/doxycycline  Vancomycin and Zosyn 7/29-8/16  Broad-spectrum antibiotics plus fluconazole 8/8-8/27  Cefepime 9/9-9/13  Vancomycin 9/13-present  Meropenem 9/13-present  Fluconazole 9/13-present  DVT prophylaxis: Eliquis   Objective: Vitals:   07/04/20 1054 07/04/20 1100  BP: 116/76 129/81  Pulse:  69  Resp:  20  Temp:  98.4 F (36.9 C)  SpO2:  96%    Intake/Output Summary (Last 24 hours) at 07/04/2020 1204 Last data filed at 07/04/2020 0815 Gross per 24 hour  Intake 850 ml  Output 0 ml  Net 850 ml   Filed Weights   07/02/20 0300 07/03/20 0322 07/04/20 0500  Weight: 60.2 kg 61.1 kg 60.4 kg   Body mass index is 19.65 kg/m.  Exam:   General: Resting comfortably, no acute distress  HEENT: Normocephalic, atraumatic, mucous membranes are dry  Neck: Supple, no JVD  Cardiovascular: Irregular rhythm, rate controlled  Respiratory: Decreased breath sounds bibasilar  Abdomen: PEG tube in place, hypoactive bowel sounds  Musculoskeletal: No clubbing or cyanosis, 1+ pitting edema  Neuro: No focal deficits  Psychiatry: Appears appropriate, no evidence of acute psychoses   Data Reviewed: CBC: Recent Labs  Lab 06/29/20 0022 07/01/20 0336 07/02/20 0054 07/03/20 0133 07/04/20 0149  WBC 6.4 7.0 6.6 6.0 5.7  NEUTROABS  --  4.1 3.4 3.0 3.2  HGB 8.2* 9.4* 8.9* 9.2* 8.4*  HCT 29.2* 33.1* 30.6* 31.6* 28.8*  MCV 80.2 81.1 80.3 80.2 80.4  PLT 475* 620* 548* 558* 440*   Basic Metabolic Panel: Recent Labs  Lab 06/29/20 0022 07/01/20 0336 07/02/20 0054 07/03/20 0133 07/04/20 0149  NA 133*  134* 134* 134* 135  K 4.0 4.3 4.0 4.1 4.0  CL 100 99 99 99 101  CO2 27 27 27 25 27   GLUCOSE  142* 104* 118* 100* 128*  BUN 10 11 13 15 14   CREATININE 0.60* 0.67 0.59* 0.66 0.69  CALCIUM 8.7* 9.0 8.9 8.8* 8.6*   GFR: Estimated Creatinine Clearance: 83.9 mL/min (by C-G formula based on SCr of 0.69 mg/dL). Liver Function Tests: Recent Labs  Lab 07/02/20 0054  AST 29  ALT 80*  ALKPHOS 110  BILITOT 0.3  PROT 7.4  ALBUMIN 2.2*   No results for input(s): LIPASE, AMYLASE in the last 168 hours. No results for input(s): AMMONIA in the last 168 hours. Coagulation Profile: No results for input(s): INR, PROTIME in the last 168 hours. Cardiac Enzymes: No results for input(s): CKTOTAL, CKMB, CKMBINDEX, TROPONINI in the last 168 hours. BNP (last 3 results) No results for input(s): PROBNP in the last 8760 hours. HbA1C: No results for input(s): HGBA1C in the last 72 hours. CBG: Recent Labs  Lab 07/03/20 1023 07/03/20 1154 07/03/20 1627 07/03/20 1949 07/04/20 0617  GLUCAP 95 125* 99 125* 136*   Lipid Profile: No results for input(s): CHOL, HDL, LDLCALC, TRIG, CHOLHDL, LDLDIRECT in the last 72 hours. Thyroid Function Tests: No results for input(s): TSH, T4TOTAL, FREET4, T3FREE, THYROIDAB in the last 72 hours. Anemia Panel: No results for input(s): VITAMINB12, FOLATE, FERRITIN, TIBC, IRON, RETICCTPCT in the last 72 hours. Urine analysis:    Component Value Date/Time   COLORURINE YELLOW 06/23/2020 [place] 06/23/2020 1338   LABSPEC 1.019 06/23/2020 1338   PHURINE 6.0 06/23/2020 1338   GLUCOSEU NEGATIVE 06/23/2020 1338   HGBUR NEGATIVE 06/23/2020 1338   BILIRUBINUR NEGATIVE 06/23/2020 1338   BILIRUBINUR NEG 09/22/2018 1527   KETONESUR NEGATIVE 06/23/2020 1338   PROTEINUR NEGATIVE 06/23/2020 1338   UROBILINOGEN 0.2 09/22/2018 1527   UROBILINOGEN 0.2 03/03/2012 0205   NITRITE NEGATIVE 06/23/2020 1338   LEUKOCYTESUR NEGATIVE 06/23/2020 1338   Sepsis Labs: @LABRCNTIP (procalcitonin:4,lacticidven:4)  ) Recent Results (from the past 240 hour(s))    Aerobic/Anaerobic Culture (surgical/deep wound)     Status: None   Collection Time: 06/25/20 10:58 AM   Specimen: Pleura; Abscess  Result Value Ref Range Status   Specimen Description PLEURAL ABSCESS RIGHT  Final   Special Requests NONE  Final   Gram Stain   Final    ABUNDANT WBC PRESENT, PREDOMINANTLY PMN ABUNDANT GRAM POSITIVE COCCI ABUNDANT GRAM VARIABLE ROD Performed at [facility], [address] [place], [place] [address]    Culture   Final    ABUNDANT METHICILLIN RESISTANT STAPHYLOCOCCUS AUREUS ABUNDANT PREVOTELLA DENTICOLA BETA LACTAMASE POSITIVE ABUNDANT STREPTOCOCCUS CONSTELLATUS    Report Status 07/02/2020 FINAL  Final   Organism ID, Bacteria METHICILLIN RESISTANT STAPHYLOCOCCUS AUREUS  Final      Susceptibility   Methicillin resistant staphylococcus aureus - MIC*    CIPROFLOXACIN >=8 RESISTANT Resistant     ERYTHROMYCIN >=8 RESISTANT Resistant     GENTAMICIN <=0.5 SENSITIVE Sensitive     OXACILLIN >=4 RESISTANT Resistant     TETRACYCLINE <=1 SENSITIVE Sensitive     VANCOMYCIN <=0.5 SENSITIVE Sensitive     TRIMETH/SULFA <=10 SENSITIVE Sensitive     CLINDAMYCIN <=0.25 SENSITIVE Sensitive     RIFAMPIN <=0.5 SENSITIVE Sensitive     Inducible Clindamycin NEGATIVE Sensitive     * ABUNDANT METHICILLIN RESISTANT STAPHYLOCOCCUS AUREUS      Studies: DG Fluoro Rm 1-60 Min  Result Date: 07/04/2020  CLINICAL DATA:  Esophageal anastomotic leak. EXAM: DG C-ARM 1-60 MIN CONTRAST:  Patient did not swallow contrast. FLUOROSCOPY TIME:  Radiation Exposure Index (if provided by the fluoroscopic device): 4.2 mGy. COMPARISON:  July 02, 2020. FINDINGS: An attempt was made to perform an esophagram. However, the patient declined to drink contrast despite repeated attempts to convince him to do so. The procedure was discontinued and the patient was returned to the floor. IMPRESSION: Unsuccessful attempt to perform esophagram as patient declined to drink contrast. Electronically  Signed   By: Marijo Conception M.D.   On: 07/04/2020 10:12   HYBRID OR IMAGING ([facility] ONLY)  Result Date: 07/03/2020 There is no interpretation for this exam.  This order is for images obtained during a surgical procedure.  Please See "Surgeries" Tab for more information regarding the procedure.    Scheduled Meds: . apixaban  5 mg Per Tube BID  . atorvastatin  40 mg Per Tube Daily  . feeding supplement (PROSource TF)  45 mL Per Tube BID  . FLUoxetine  20 mg Per Tube Daily  . insulin aspart  0-24 Units Subcutaneous TID AC & HS  . mouth rinse  15 mL Mouth Rinse q12n4p  . metoprolol tartrate  100 mg Per Tube BID  . nystatin  5 mL Mouth/Throat QID  . pantoprazole sodium  40 mg Per Tube BID  . sennosides  5 mL Per Tube QHS  . traZODone  50 mg Per Tube QHS    Continuous Infusions: . sodium chloride 1,000 mL (06/06/20 0028)  . sodium chloride    . feeding supplement (OSMOLITE 1.5 CAL) 1,000 mL (07/03/20 1742)  . fluconazole (DIFLUCAN) IV 400 mg (07/02/20 1718)  . meropenem (MERREM) IV 1 g (07/04/20 0908)  . vancomycin 750 mg (07/04/20 0755)     LOS: 2 days     Annita Brod, MD [facility]   07/04/2020, 12:04 PM

## 2020-07-05 LAB — CBC WITH DIFFERENTIAL/PLATELET
Abs Immature Granulocytes: 0.02 10*3/uL (ref 0.00–0.07)
Basophils Absolute: 0 10*3/uL (ref 0.0–0.1)
Basophils Relative: 1 %
Eosinophils Absolute: 0.1 10*3/uL (ref 0.0–0.5)
Eosinophils Relative: 2 %
HCT: 29.7 % — ABNORMAL LOW (ref 39.0–52.0)
Hemoglobin: 8.5 g/dL — ABNORMAL LOW (ref 13.0–17.0)
Immature Granulocytes: 0 %
Lymphocytes Relative: 35 %
Lymphs Abs: 1.7 10*3/uL (ref 0.7–4.0)
MCH: 22.8 pg — ABNORMAL LOW (ref 26.0–34.0)
MCHC: 28.6 g/dL — ABNORMAL LOW (ref 30.0–36.0)
MCV: 79.6 fL — ABNORMAL LOW (ref 80.0–100.0)
Monocytes Absolute: 0.4 10*3/uL (ref 0.1–1.0)
Monocytes Relative: 8 %
Neutro Abs: 2.6 10*3/uL (ref 1.7–7.7)
Neutrophils Relative %: 54 %
Platelets: 457 10*3/uL — ABNORMAL HIGH (ref 150–400)
RBC: 3.73 MIL/uL — ABNORMAL LOW (ref 4.22–5.81)
RDW: 16.4 % — ABNORMAL HIGH (ref 11.5–15.5)
WBC: 4.9 10*3/uL (ref 4.0–10.5)
nRBC: 0 % (ref 0.0–0.2)

## 2020-07-05 LAB — GLUCOSE, CAPILLARY
Glucose-Capillary: 119 mg/dL — ABNORMAL HIGH (ref 70–99)
Glucose-Capillary: 127 mg/dL — ABNORMAL HIGH (ref 70–99)
Glucose-Capillary: 118 mg/dL — ABNORMAL HIGH (ref 70–99)
Glucose-Capillary: 112 mg/dL — ABNORMAL HIGH (ref 70–99)
Glucose-Capillary: 88 mg/dL (ref 70–99)

## 2020-07-05 LAB — BASIC METABOLIC PANEL
Anion gap: 8 (ref 5–15)
BUN: 12 mg/dL (ref 6–20)
CO2: 27 mmol/L (ref 22–32)
Calcium: 8.7 mg/dL — ABNORMAL LOW (ref 8.9–10.3)
Chloride: 102 mmol/L (ref 98–111)
Creatinine, Ser: 0.62 mg/dL (ref 0.61–1.24)
GFR calc Af Amer: >60 mL/min (ref >60)
GFR calc non Af Amer: >60 mL/min (ref >60)
Glucose, Bld: 126 mg/dL — ABNORMAL HIGH (ref 70–99)
Potassium: 4.1 mmol/L (ref 3.5–5.1)
Sodium: 137 mmol/L (ref 135–145)

## 2020-07-05 LAB — VANCOMYCIN, PEAK: Vancomycin Pk: 36 ug/mL (ref 30–40)

## 2020-07-05 LAB — VANCOMYCIN, TROUGH: Vancomycin Tr: 18 ug/mL (ref 15–20)

## 2020-07-05 MED ORDER — VANCOMYCIN HCL 750 MG/150ML IV SOLN
750.0000 mg | Freq: Two times a day (BID) | INTRAVENOUS | Status: AC
  Administered 2020-07-06 – 2020-07-08 (×6): 750 mg via INTRAVENOUS
  Filled 2020-07-05 (×6): qty 150

## 2020-07-05 NOTE — Progress Notes (Signed)
Occupational Therapy Treatment Patient Details Name: Evan Chavez MRN: [id number] DOB: 17-Jun-1961 Today's Date: 07/05/2020    History of present illness Pt is 59 yo male presents to the hospital on 7/28 with generalized weakness, productive cough and low-grade fever. Pt is currently being treated for hydropneumothorax, Sepsis aspiration pneumonia and esophagal rupture, AKI and acute delirium. s/p R thoracentesis on 8/2. Chest tube placed 8/2-8/9. s/p VATS, decortication, EGD, PEG tube placement, and a esophageal stent on 8/9. PMH of GERD, Barrett's esophagus, PUD with extensive gastric erosion, duodenal ulcer and esophageal stricture, stroke, HLD, BPH, cocaine abuse. s/p Right thoracoscopy, chest tube placement, esophageal stent placement 8/13-8/20. 9/13- s/p rt chest tube placement. 9/21 pt underwent EGD with esophageal stent removal   OT comments  Pt was immediately willing to get OOB, walk and participate in ADL with OT. OOB, stood and ambulated with walker and min guard assist. Completed toileting and grooming standing at sink. Pt agreeable to remaining up in chair at end of session.   Follow Up Recommendations  SNF;Supervision/Assistance - 24 hour    Equipment Recommendations  Other (comment) (defer to next venue)    Recommendations for Other Services      Precautions / Restrictions Precautions Precautions: Fall Precaution Comments: PEG, right weakness, R chest tube       Mobility Bed Mobility Overal bed mobility: Needs Assistance Bed Mobility: Supine to Sit     Supine to sit: Supervision     General bed mobility comments: no physical assist  Transfers Overall transfer level: Needs assistance Equipment used: Rolling walker (2 wheeled) Transfers: Sit to/from Stand Sit to Stand: Min guard         General transfer comment: cues for hand placement, no physical assist from bed    Balance Overall balance assessment: Needs assistance Sitting-balance support: Feet  supported Sitting balance-Leahy Scale: Fair     Standing balance support: Bilateral upper extremity supported Standing balance-Leahy Scale: Poor                             ADL either performed or assessed with clinical judgement   ADL Overall ADL's : Needs assistance/impaired     Grooming: Oral care;Standing;Min guard           Upper Body Dressing : Minimal assistance;Sitting Upper Body Dressing Details (indicate cue type and reason): front opening gown     Toilet Transfer: Min guard;Ambulation;BSC;RW           Functional mobility during ADLs: Min guard;Rolling walker       Vision       Perception     Praxis      Cognition Arousal/Alertness: Awake/alert Behavior During Therapy: Flat affect Overall Cognitive Status: No family/caregiver present to determine baseline cognitive functioning                                 General Comments: difficult to assess, non verbal        Exercises     Shoulder Instructions       General Comments      Pertinent Vitals/ Pain       Pain Assessment: No/denies pain  Home Living  Prior Functioning/Environment              Frequency  Min 2X/week        Progress Toward Goals  OT Goals(current goals can now be found in the care plan section)  Progress towards OT goals: Progressing toward goals  Acute Rehab OT Goals Patient Stated Goal: willing to get OOB  OT Goal Formulation: With patient Time For Goal Achievement: 07/12/20 Potential to Achieve Goals: Good  Plan Discharge plan remains appropriate    Co-evaluation                 AM-PAC OT "6 Clicks" Daily Activity     Outcome Measure   Help from another person eating meals?: Total Help from another person taking care of personal grooming?: A Little Help from another person toileting, which includes using toliet, bedpan, or urinal?: A Lot Help from  another person bathing (including washing, rinsing, drying)?: A Lot Help from another person to put on and taking off regular upper body clothing?: A Little Help from another person to put on and taking off regular lower body clothing?: A Lot 6 Click Score: 13    End of Session Equipment Utilized During Treatment: Rolling walker;Gait belt  OT Visit Diagnosis: Unsteadiness on feet (R26.81);Other abnormalities of gait and mobility (R26.89);Muscle weakness (generalized) (M62.81);Other symptoms and signs involving cognitive function;Feeding difficulties (R63.3)   Activity Tolerance Patient tolerated treatment well   Patient Left in chair;with call bell/phone within reach;with chair alarm set   Nurse Communication Mobility status        Time: 2800-3491 OT Time Calculation (min): 19 min  Charges: OT General Charges $OT Visit: 1 Visit OT Treatments $Self Care/Home Management : 8-22 mins  Nestor Lewandowsky, OTR/L Acute Rehabilitation Services Pager: [phone] Office: [phone]   Malka So 07/05/2020, 1:29 PM

## 2020-07-05 NOTE — Progress Notes (Addendum)
PROGRESS NOTE    Evan Chavez  TZG:[id number] DOB: [birth year] DOA: 05/10/2020 PCP: Rosita Fire, MD    Brief Narrative:  59 year old male with PMH of Barrett's esophagus, peptic ulcer disease with hiatal hernia and duodenal ulcer,  BPH and remote history of substance abuse who presented on 7/29 with recurrent pneumonia and parapneumonic effusion and was admitted to the hospitalist service.  Patient underwent right-sided thoracentesis on 8/2 which was complicated by pneumothorax.  A CT of the scan of the chest done 8/4 noted esophageal perforation arising from the right lateral aspect of mid to distal esophagus and large right-sided hydropneumothorax.  Patient was transferred from [facility]  on 8/4 to [facility] and underwent right sided chest tube placement.  On 8/5,  He underwent a VATS decortication, PEG tube placement and esophageal stent placement.  On 8/7 a repeat esophagram noted persistent leakage from the esophagus to the right pleural space.  On 8/13, patient underwent a right thoracoscopy with drainage of his loculated pleural effusion, and esophago-gastroscopy and esophageal stent placement.  Follow-up esophagram a few days later did not note any leak.  Patient's hospital course was also complicated by development of new onset atrial fibrillation.  Also noted to have left-sided hydronephrosis with plans for shockwave lithotripsy as outpatient.  On 8/21, patient restarted on broad-spectrum antibiotics after x-ray and CT chest noted right lower lobe pneumonia that had developed into sepsis.  CT scan of chest on 9/7 noted interval increase in loculated fluid collection with surrounding enhancing pleura indeterminate by imaging but possibly empyema.  CT surgery reconsulted initially no plans for further surgery or drainage, but then patient started developing low-grade temperature and white count and CT of abdomen/pelvis noted loculated complex pleural effusion possible abscess,  Interventional radiology consulted.  After scheduled CT surgery, patient underwent percutaneous chest tube placement.  MRSA PCR now positive and with persistent right pleural empyema, infectious disease consulted.  Cultures grew out staph aureus.  Patient has been placed on 3 drug regimen of meropenem, fluconazole and vancomycin.  Patient underwent follow-up CT on 9/20 noting very little fluid remaining.  Patient had follow-up by speech therapy and mostly noncooperative during evaluation.  Declined all offers of p.o. eventually accepting a few sips of water. On 9/21, cardiothoracic surgery removed esophageal stent.  Follow-up esophagram noted patent esophagus with no signs of leakage.  Infectious disease recommended continue three antibiotic regimen for 3 more days.  IR recommended continue current chest tube management and remain to suction at this time.  Assessment & Plan:   Principal Problem:   Pleural empyema (HCC) Active Problems:   Essential hypertension   Atrial tachycardia (HCC)   Dyslipidemia   Transaminitis   Gastroesophageal reflux disease with esophagitis   Barrett's esophagus   Thyroid nodule   BPH (benign prostatic hyperplasia)   Ureteral stone with hydronephrosis   Pneumothorax on right   Acute respiratory failure with hypoxia (HCC)   Malnutrition of moderate degree   Esophageal perforation   Pressure injury of skin   Atrial fibrillation with RVR (HCC)   Right lower lobe pulmonary infiltrate   Dysphagia   Status post thoracentesis   Chronic diastolic CHF (congestive heart failure) (HCC)   Community-acquiredpneumonia with parapneumonic effusion that caused sepsis that was present on admission that developed into hydropneumothorax and recurrent empyema causing acute respiratory failure with hypoxia: Patient initially presented with sepsis on admission given tachycardia, tachypnea, leukocytosis in the setting of pneumonia and organ dysfunction including acute respiratory  failure  and lactic acidosis.  Sepsis itself initially has been stabilized.  He since then has developed recurrent empyema is currently status post chest tube placement on 9/13 and as per infectious disease on 3 drug regimen of vancomycin, meropenem and fluconazole.  Follow-up CT notes minimal fluid remaining, although chest tube now does note some whitish drainage.  ID recommended continue 3 antibiotic regimen for 3 more days.  Esophageal perforation: >> Resolved.  Initially, CT surgery had planned to remove his esophageal stent last week and that was prior to discovery of his most recent empyema.  Esophageal stent removed 9/21.  Esophagram following notes no leakage.  Oropharyngealdysphagia: Status post PEG tube placement on tube feeds.  Seen by speech therapy and he continues to have episodes of bolus holding.  Was on clear liquids only, but after chest tube placement from recurrent empyema, changed to NPO.  Unfortunately, continues to take little in terms of p.o. and direction.  remains on tube feeds only.  Paroxysmal atrial fibrillation: Continues to remain rate controlled.  Continue apixaban and metoprolol. Echocardiogram showed preserved EF without any wall motion abnormality or valvular abnormality.  Had been seen by cardiology.  Chronic diastolic heart failure: Incidentally noted on echocardiogram done 7/30.  BNP on 9/20 at 25.  Remains euvolemic.  Acute on chronic blood lossanemia Hgb stable.  Today at 8.5.  Cerebrovascular disease, secondary prevention LFTs stable, Lipitor resumed.   Moderate protein calorie malnutrition Body mass index is 19.6.  Patient meets criteria in the context of chronic illness (Barrett's esophagus and GERD) as evidenced by moderate fat and muscle depletion.  Being followed by nutrition.  Currently on Osmolite plus Prosource tube feeds.  Left-sided hydronephrosis with ureteral stone: Initially seen on presentation and urology consulted.  Outpatient  follow-up with urology to do lithotripsy   Depressions -ContinueProzac   DVT prophylaxis: Eliquis Code Status: Full Family Communication: No one was at bed side. Disposition Plan:  Status is: Inpatient  Remains inpatient appropriate because:Inpatient level of care appropriate due to severity of illness   Dispo:  Patient From:   Home  Planned Disposition: [place]  Expected discharge date: 07/16/20  Medically stable for discharge:   Patient is not medically clear  Consultants:   Critical care  Cardiothoracic surgery  Interventional radiology  Infectious disease  Urology  Nutrition  Cardiology  Procedures:   Status post thoracentesis done 8/2.  Initial chest tube placement  VATS decortication  PEG tube placement  Esophageal stent placement  Right-sided thoracoscopy with drainage of loculated effusion  second esophageal stent placement with removal on 9/22  Second chest tube placement  Echocardiogram done 6/78 noting diastolic dysfunction and preserved ejection fraction   Antimicrobials:  Anti-infectives (From admission, onward)   Start     Dose/Rate Route Frequency Ordered Stop   06/28/20 2300  vancomycin (VANCOREADY) IVPB 750 mg/150 mL        750 mg 150 mL/hr over 60 Minutes Intravenous Every 8 hours 06/28/20 1519 07/08/20 2359   06/26/20 1300  fluconazole (DIFLUCAN) IVPB 400 mg        400 mg 100 mL/hr over 120 Minutes Intravenous Every 24 hours 06/25/20 1513 07/08/20 2359   06/26/20 0100  vancomycin (VANCOREADY) IVPB 500 mg/100 mL  Status:  Discontinued        500 mg 100 mL/hr over 60 Minutes Intravenous Every 12 hours 06/25/20 1240 06/28/20 1519   06/25/20 1600  meropenem (MERREM) 1 g in sodium chloride 0.9 % 100 mL IVPB  1 g 200 mL/hr over 30 Minutes Intravenous Every 8 hours 06/25/20 1513 07/08/20 2359   06/25/20 1600  fluconazole (DIFLUCAN) IVPB 200 mg        200 mg 100 mL/hr over 60 Minutes Intravenous  Once  06/25/20 1515 06/25/20 1902   06/25/20 1300  fluconazole (DIFLUCAN) tablet 200 mg        200 mg Oral  Once 06/25/20 1209 06/25/20 1308   06/25/20 1245  vancomycin (VANCOREADY) IVPB 1250 mg/250 mL        1,250 mg 166.7 mL/hr over 90 Minutes Intravenous  Once 06/25/20 1238 06/25/20 1445   06/22/20 2030  ceFEPIme (MAXIPIME) 2 g in sodium chloride 0.9 % 100 mL IVPB  Status:  Discontinued        2 g 200 mL/hr over 30 Minutes Intravenous Every 8 hours 06/22/20 2020 06/25/20 1432   06/03/20 0200  vancomycin (VANCOCIN) IVPB 1000 mg/200 mL premix  Status:  Discontinued        1,000 mg 200 mL/hr over 60 Minutes Intravenous Every 12 hours 06/02/20 1248 06/05/20 0915   06/02/20 2200  ceFEPIme (MAXIPIME) 2 g in sodium chloride 0.9 % 100 mL IVPB        2 g 200 mL/hr over 30 Minutes Intravenous Every 8 hours 06/02/20 1251 06/08/20 2359   06/02/20 2000  piperacillin-tazobactam (ZOSYN) IVPB 3.375 g  Status:  Discontinued        3.375 g 12.5 mL/hr over 240 Minutes Intravenous Every 8 hours 06/02/20 1248 06/02/20 1249   06/02/20 1330  vancomycin (VANCOREADY) IVPB 1250 mg/250 mL        1,250 mg 166.7 mL/hr over 90 Minutes Intravenous  Once 06/02/20 1248 06/02/20 1646   06/02/20 1330  piperacillin-tazobactam (ZOSYN) IVPB 3.375 g  Status:  Discontinued        3.375 g 100 mL/hr over 30 Minutes Intravenous  Once 06/02/20 1248 06/02/20 1249   06/02/20 1215  vancomycin (VANCOCIN) IVPB 1000 mg/200 mL premix  Status:  Discontinued        1,000 mg 200 mL/hr over 60 Minutes Intravenous  Once 06/02/20 1202 06/02/20 1251   06/02/20 1215  ceFEPIme (MAXIPIME) 2 g in sodium chloride 0.9 % 100 mL IVPB        2 g 200 mL/hr over 30 Minutes Intravenous  Once 06/02/20 1202 06/02/20 1506   05/29/20 1800  vancomycin (VANCOCIN) IVPB 1000 mg/200 mL premix  Status:  Discontinued        1,000 mg 200 mL/hr over 60 Minutes Intravenous Every 12 hours 05/29/20 1151 05/30/20 0912   05/24/20 2100  vancomycin (VANCOREADY) IVPB 750  mg/150 mL  Status:  Discontinued        750 mg 150 mL/hr over 60 Minutes Intravenous Every 12 hours 05/24/20 0813 05/29/20 1151   05/24/20 0900  vancomycin (VANCOREADY) IVPB 1500 mg/300 mL        1,500 mg 150 mL/hr over 120 Minutes Intravenous  Once 05/24/20 0813 05/24/20 1221   05/17/20 0900  fluconazole (DIFLUCAN) IVPB 400 mg  Status:  Discontinued        400 mg 100 mL/hr over 120 Minutes Intravenous Every 24 hours 05/17/20 0749 06/05/20 0915   05/17/20 0600  vancomycin (VANCOREADY) IVPB 750 mg/150 mL  Status:  Discontinued        750 mg 150 mL/hr over 60 Minutes Intravenous Every 8 hours 05/16/20 2154 05/17/20 0903   05/17/20 0400  vancomycin (VANCOREADY) IVPB 750 mg/150 mL  Status:  Discontinued  750 mg 150 mL/hr over 60 Minutes Intravenous Every 8 hours 05/16/20 1945 05/16/20 2154   05/16/20 2200  vancomycin (VANCOREADY) IVPB 1250 mg/250 mL        1,250 mg 166.7 mL/hr over 90 Minutes Intravenous STAT 05/16/20 2153 05/16/20 2348   05/16/20 2000  vancomycin (VANCOREADY) IVPB 1250 mg/250 mL  Status:  Discontinued        1,250 mg 166.7 mL/hr over 90 Minutes Intravenous  Once 05/16/20 1945 05/16/20 2153   05/15/20 1600  piperacillin-tazobactam (ZOSYN) IVPB 3.375 g  Status:  Discontinued        3.375 g 12.5 mL/hr over 240 Minutes Intravenous Every 8 hours 05/15/20 0945 05/30/20 0912   05/15/20 0930  piperacillin-tazobactam (ZOSYN) IVPB 3.375 g        3.375 g 100 mL/hr over 30 Minutes Intravenous  Once 05/15/20 0921 05/15/20 1009   05/13/20 2000  azithromycin (ZITHROMAX) tablet 500 mg        500 mg Oral Daily 05/13/20 1155 05/14/20 2127   05/10/20 2200  cefTRIAXone (ROCEPHIN) 2 g in sodium chloride 0.9 % 100 mL IVPB  Status:  Discontinued        2 g 200 mL/hr over 30 Minutes Intravenous Every 24 hours 05/10/20 2148 05/15/20 0911   05/10/20 2200  azithromycin (ZITHROMAX) 500 mg in sodium chloride 0.9 % 250 mL IVPB  Status:  Discontinued        500 mg 250 mL/hr over 60 Minutes  Intravenous Every 24 hours 05/10/20 2148 05/13/20 1155     Subjective: Patient was seen and examined at bedside.  Overnight events noted.   He was alert, oriented to self.  He was following commands has some thick secretions.  Denies any shortness of breath.  Objective: Vitals:   07/04/20 1924 07/04/20 2355 07/05/20 0237 07/05/20 0722  BP: 126/80 124/74 117/82 117/78  Pulse: 73 73 73 72  Resp: 20 19 20 17   Temp: 98.2 F (36.8 C) 98.3 F (36.8 C) 98.3 F (36.8 C) 98.7 F (37.1 C)  TempSrc: Oral Oral Oral Oral  SpO2: 98% 100% 99% 100%  Weight:   60.4 kg   Height:        Intake/Output Summary (Last 24 hours) at 07/05/2020 1417 Last data filed at 07/05/2020 1300 Gross per 24 hour  Intake 430 ml  Output 1450 ml  Net -1020 ml   Filed Weights   07/03/20 0322 07/04/20 0500 07/05/20 0237  Weight: 61.1 kg 60.4 kg 60.4 kg    Examination:  General exam: Appears calm and comfortable, Chest tube noted.  Respiratory system: Clear to auscultation. Respiratory effort normal. Cardiovascular system: S1 & S2 heard, RRR. No JVD, murmurs, rubs, gallops or clicks. No pedal edema. Gastrointestinal system: Abdomen is nondistended, soft and nontender. No organomegaly or masses felt. Peg tube noted. bowel sounds heard. Central nervous system: Alert and oriented. No focal neurological deficits. Extremities: no edema. No clubbing, no cyanosis Skin: No rashes, lesions or ulcers Psychiatry: Mood & affect appropriate.     Data Reviewed: I have personally reviewed following labs and imaging studies  CBC: Recent Labs  Lab 07/01/20 0336 07/02/20 0054 07/03/20 0133 07/04/20 0149 07/05/20 0155  WBC 7.0 6.6 6.0 5.7 4.9  NEUTROABS 4.1 3.4 3.0 3.2 2.6  HGB 9.4* 8.9* 9.2* 8.4* 8.5*  HCT 33.1* 30.6* 31.6* 28.8* 29.7*  MCV 81.1 80.3 80.2 80.4 79.6*  PLT 620* 548* 558* 470* 973*   Basic Metabolic Panel: Recent Labs  Lab 07/01/20 0336 07/02/20 0054  07/03/20 0133 07/04/20 0149 07/05/20 0155   NA 134* 134* 134* 135 137  K 4.3 4.0 4.1 4.0 4.1  CL 99 99 99 101 102  CO2 27 27 25 27 27   GLUCOSE 104* 118* 100* 128* 126*  BUN 11 13 15 14 12   CREATININE 0.67 0.59* 0.66 0.69 0.62  CALCIUM 9.0 8.9 8.8* 8.6* 8.7*   GFR: Estimated Creatinine Clearance: 83.9 mL/min (by C-G formula based on SCr of 0.62 mg/dL). Liver Function Tests: Recent Labs  Lab 07/02/20 0054  AST 29  ALT 80*  ALKPHOS 110  BILITOT 0.3  PROT 7.4  ALBUMIN 2.2*   No results for input(s): LIPASE, AMYLASE in the last 168 hours. No results for input(s): AMMONIA in the last 168 hours. Coagulation Profile: No results for input(s): INR, PROTIME in the last 168 hours. Cardiac Enzymes: No results for input(s): CKTOTAL, CKMB, CKMBINDEX, TROPONINI in the last 168 hours. BNP (last 3 results) No results for input(s): PROBNP in the last 8760 hours. HbA1C: No results for input(s): HGBA1C in the last 72 hours. CBG: Recent Labs  Lab 07/04/20 1559 07/04/20 2128 07/05/20 0606 07/05/20 0826 07/05/20 1138  GLUCAP 95 114* 119* 127* 118*   Lipid Profile: No results for input(s): CHOL, HDL, LDLCALC, TRIG, CHOLHDL, LDLDIRECT in the last 72 hours. Thyroid Function Tests: No results for input(s): TSH, T4TOTAL, FREET4, T3FREE, THYROIDAB in the last 72 hours. Anemia Panel: No results for input(s): VITAMINB12, FOLATE, FERRITIN, TIBC, IRON, RETICCTPCT in the last 72 hours. Sepsis Labs: Recent Labs  Lab 07/02/20 0054  PROCALCITON <0.10    No results found for this or any previous visit (from the past 240 hour(s)).   Radiology Studies: DG Fluoro Rm 1-60 Min  Result Date: 07/04/2020 CLINICAL DATA:  Esophageal anastomotic leak. EXAM: DG C-ARM 1-60 MIN CONTRAST:  Patient did not swallow contrast. FLUOROSCOPY TIME:  Radiation Exposure Index (if provided by the fluoroscopic device): 4.2 mGy. COMPARISON:  July 02, 2020. FINDINGS: An attempt was made to perform an esophagram. However, the patient declined to drink contrast  despite repeated attempts to convince him to do so. The procedure was discontinued and the patient was returned to the floor. IMPRESSION: Unsuccessful attempt to perform esophagram as patient declined to drink contrast. Electronically Signed   By: Marijo Conception M.D.   On: 07/04/2020 10:12    Scheduled Meds: . apixaban  5 mg Per Tube BID  . atorvastatin  40 mg Per Tube Daily  . feeding supplement (PROSource TF)  45 mL Per Tube BID  . FLUoxetine  20 mg Per Tube Daily  . insulin aspart  0-24 Units Subcutaneous TID AC & HS  . mouth rinse  15 mL Mouth Rinse q12n4p  . metoprolol tartrate  100 mg Per Tube BID  . nystatin  5 mL Mouth/Throat QID  . pantoprazole sodium  40 mg Per Tube BID  . sennosides  5 mL Per Tube QHS  . traZODone  50 mg Per Tube QHS   Continuous Infusions: . sodium chloride 1,000 mL (06/06/20 0028)  . sodium chloride    . feeding supplement (OSMOLITE 1.5 CAL) 60 mL/hr at 07/04/20 1924  . fluconazole (DIFLUCAN) IV 400 mg (07/05/20 1247)  . meropenem (MERREM) IV 1 g (07/05/20 0937)  . vancomycin 750 mg (07/05/20 0648)     LOS: 55 days    Time spent: 35 mins.    Shawna Clamp, MD [facility]   If 7PM-7AM, please contact night-coverage

## 2020-07-05 NOTE — Progress Notes (Signed)
Patient ID: Evan Chavez, male   DOB: Jan 29, 1961, 59 y.o.   MRN: [id number]         [facility] for Infectious Disease  Date of Admission:  05/10/2020   Total days of antibiotics 14        Day 11 vancomycin        Day 11 meropenem        Day 11 fluconazole ASSESSMENT: His persistent polymicrobial pleural empyema has improved following repeat chest tube drainage and a second course of antibiotic therapy.  PLAN: 1. Continue current 3 drug antimicrobial regimen for 3 more days (stop orders placed) 2. Please call if I can be of further assistance   Principal Problem:   Pleural empyema ([place]) Active Problems:   Pneumothorax on right   Esophageal perforation   Essential hypertension   Atrial tachycardia (HCC)   Dyslipidemia   Transaminitis   Gastroesophageal reflux disease with esophagitis   Barrett's esophagus   Thyroid nodule   BPH (benign prostatic hyperplasia)   Ureteral stone with hydronephrosis   Acute respiratory failure with hypoxia (HCC)   Malnutrition of moderate degree   Pressure injury of skin   Atrial fibrillation with RVR (HCC)   Right lower lobe pulmonary infiltrate   Dysphagia   Status post thoracentesis   Chronic diastolic CHF (congestive heart failure) (HCC)   Scheduled Meds: . apixaban  5 mg Per Tube BID  . atorvastatin  40 mg Per Tube Daily  . feeding supplement (PROSource TF)  45 mL Per Tube BID  . FLUoxetine  20 mg Per Tube Daily  . insulin aspart  0-24 Units Subcutaneous TID AC & HS  . mouth rinse  15 mL Mouth Rinse q12n4p  . metoprolol tartrate  100 mg Per Tube BID  . nystatin  5 mL Mouth/Throat QID  . pantoprazole sodium  40 mg Per Tube BID  . sennosides  5 mL Per Tube QHS  . traZODone  50 mg Per Tube QHS   Continuous Infusions: . sodium chloride 1,000 mL (06/06/20 0028)  . sodium chloride    . feeding supplement (OSMOLITE 1.5 CAL) 60 mL/hr at 07/04/20 1924  . fluconazole (DIFLUCAN) IV 400 mg (07/05/20 1247)  . meropenem  (MERREM) IV 1 g (07/05/20 0937)  . vancomycin 750 mg (07/05/20 0648)   PRN Meds:.sodium chloride, [CANCELED] Place/Maintain arterial line **AND** sodium chloride, acetaminophen (TYLENOL) oral liquid 160 mg/5 mL, fentaNYL (SUBLIMAZE) injection, guaiFENesin-dextromethorphan, hydrALAZINE, ipratropium-albuterol, metoprolol tartrate, ondansetron (ZOFRAN) IV, oxyCODONE, phenol, traMADol   SUBJECTIVE: He nods "yes" that he is feeling better.  His mother is visiting.  Review of Systems: Review of Systems  Unable to perform ROS: Language    No Known Allergies  OBJECTIVE: Vitals:   07/04/20 1924 07/04/20 2355 07/05/20 0237 07/05/20 0722  BP: 126/80 124/74 117/82 117/78  Pulse: 73 73 73 72  Resp: 20 19 20 17   Temp: 98.2 F (36.8 C) 98.3 F (36.8 C) 98.3 F (36.8 C) 98.7 F (37.1 C)  TempSrc: Oral Oral Oral Oral  SpO2: 98% 100% 99% 100%  Weight:   60.4 kg   Height:       Body mass index is 19.65 kg/m.  Physical Exam Constitutional:      Comments: He is resting quietly in bed.  Cardiovascular:     Rate and Rhythm: Normal rate and regular rhythm.     Heart sounds: No murmur heard.   Pulmonary:     Effort: Pulmonary effort is normal.  Comments: Shallow respirations.  No chest tube output recorded yesterday in the past 48 hours.  His nurse confirmed that there has been no output.  The fluid in his chest tube canister is old.    Lab Results Lab Results  Component Value Date   WBC 4.9 07/05/2020   HGB 8.5 (L) 07/05/2020   HCT 29.7 (L) 07/05/2020   MCV 79.6 (L) 07/05/2020   PLT 457 (H) 07/05/2020    Lab Results  Component Value Date   CREATININE 0.62 07/05/2020   BUN 12 07/05/2020   NA 137 07/05/2020   K 4.1 07/05/2020   CL 102 07/05/2020   CO2 27 07/05/2020    Lab Results  Component Value Date   ALT 80 (H) 07/02/2020   AST 29 07/02/2020   ALKPHOS 110 07/02/2020   BILITOT 0.3 07/02/2020     Microbiology: No results found for this or any previous visit (from  the past 240 hour(s)).  Michel Bickers, MD [facility] [place] [facility] [phone] pager   [phone] cell 07/05/2020, 1:49 PM

## 2020-07-05 NOTE — Progress Notes (Signed)
Pharmacy Antibiotic Note  Evan Chavez is a 59 y.o. male admitted on 05/10/2020, being treated for persistent polymicrobial right pleural empyema.  Pharmacy has been consulted for vancomycin, meropenem, and fluconazole dosing. Infectious diseases is seeing the patient and has recommended to continue these antibiotics through 9/26.   Vancomycin pk/tr 36/18, drawn appropriately at steady state. Pt specific pk parameters: ke: 0.1272, T1/2: 5.4hrs, Vd: 25.8L, estimated AUC at current dose is 686 which is above the recommended goal AUC of 400-600. Since we measured both pk/tr on this patient we will adjust his dose based on AUC recommendations to finish up his treatment.   Estimated AUC on 750mg  IV q12h is 457.   Plan: Change vancomycin to 750mg  IV q12h  Merrem 1g IV q8h  Fluconazole 400mg  IV daily All abx to end on 9/26  No further levels required   Height: 5' 9.02" (175.3 cm) Weight: 60.4 kg (133 lb 2.5 oz) IBW/kg (Calculated) : 70.74  Temp (24hrs), Avg:98.3 F (36.8 C), Min:98.2 F (36.8 C), Max:98.7 F (37.1 C)  Recent Labs  Lab 07/01/20 0336 07/01/20 2243 07/02/20 0054 07/03/20 0133 07/04/20 0149 07/05/20 0155 07/05/20 0909 07/05/20 1436  WBC 7.0  --  6.6 6.0 5.7 4.9  --   --   CREATININE 0.67  --  0.59* 0.66 0.69 0.62  --   --   VANCOTROUGH  --  18  --   --   --   --   --  18  VANCOPEAK  --   --   --   --   --   --  36  --     Estimated Creatinine Clearance: 83.9 mL/min (by C-G formula based on SCr of 0.62 mg/dL).    No Known Allergies   Thank you for allowing pharmacy to be a part of this patient's care.  Nicoletta Dress, PharmD Infectious Disease Pharmacist  Phone: [phone] 07/05/2020 3:47 PM

## 2020-07-06 LAB — CBC
HCT: 31.7 % — ABNORMAL LOW (ref 39.0–52.0)
Hemoglobin: 8.9 g/dL — ABNORMAL LOW (ref 13.0–17.0)
MCH: 22.5 pg — ABNORMAL LOW (ref 26.0–34.0)
MCHC: 28.1 g/dL — ABNORMAL LOW (ref 30.0–36.0)
MCV: 80.3 fL (ref 80.0–100.0)
Platelets: 487 10*3/uL — ABNORMAL HIGH (ref 150–400)
RBC: 3.95 MIL/uL — ABNORMAL LOW (ref 4.22–5.81)
RDW: 16.2 % — ABNORMAL HIGH (ref 11.5–15.5)
WBC: 5.8 10*3/uL (ref 4.0–10.5)
nRBC: 0 % (ref 0.0–0.2)

## 2020-07-06 LAB — GLUCOSE, CAPILLARY
Glucose-Capillary: 133 mg/dL — ABNORMAL HIGH (ref 70–99)
Glucose-Capillary: 128 mg/dL — ABNORMAL HIGH (ref 70–99)
Glucose-Capillary: 109 mg/dL — ABNORMAL HIGH (ref 70–99)
Glucose-Capillary: 123 mg/dL — ABNORMAL HIGH (ref 70–99)

## 2020-07-06 NOTE — Progress Notes (Signed)
Referring Physician(s): Dr. Kipp Brood  Supervising Physician: Jacqulynn Cadet  Patient Status:  [facility] - In-pt  Chief Complaint: History of esophageal perforation complicated by development of left empyema s/p left chest tube placement in IR 06/25/2020.  Subjective: Patient in bed, appears comfortable. He nods his head yes/no appropriately and makes good eye contact with me. He is on room air. Left chest tube has had zero output since 07/02/20.   Allergies: Patient has no known allergies.  Medications: Prior to Admission medications   Medication Sig Start Date End Date Taking? Authorizing Provider  acetaminophen (TYLENOL) 500 MG tablet Take 1,000 mg by mouth every 6 (six) hours as needed (for pain).   Yes [provider]  amantadine (SYMMETREL) 100 MG capsule Take 100 mg by mouth 2 (two) times daily.    Yes [provider]  amoxicillin (AMOXIL) 500 MG capsule Take 2 capsules (1,000 mg total) by mouth 3 (three) times daily. 05/09/20  Yes Fransico Meadow, PA-C  aspirin 325 MG tablet Take 1 tablet (325 mg total) by mouth daily. 07/16/18  Yes Barton Dubois, MD  atorvastatin (LIPITOR) 40 MG tablet Take 40 mg by mouth daily.   Yes [provider]  benzonatate (TESSALON) 100 MG capsule Take 100 mg by mouth 3 (three) times daily. 05/02/20  Yes [provider]  diltiazem (CARDIZEM CD) 120 MG 24 hr capsule Take 1 capsule (120 mg total) by mouth daily. Patient taking differently: Take 120 mg by mouth daily after breakfast.  09/22/18  Yes Soyla Dryer, PA-C  doxycycline (VIBRA-TABS) 100 MG tablet Take 1 tablet (100 mg total) by mouth 2 (two) times daily. 05/09/20  Yes Fransico Meadow, PA-C  Ferrous Sulfate (IRON PO) Take 65 mg by mouth 2 (two) times daily with a meal.   Yes [provider]  loratadine (CLARITIN) 10 MG tablet Take 10 mg by mouth daily. 05/02/20  Yes [provider]  Multiple Vitamin (MULTIVITAMIN WITH MINERALS) TABS tablet Take 1  tablet by mouth daily after breakfast.    Yes [provider]  pantoprazole (PROTONIX) 40 MG tablet Take 1 tablet (40 mg total) by mouth 2 (two) times daily before a meal. 09/22/18  Yes Soyla Dryer, PA-C  sulfamethoxazole-trimethoprim (BACTRIM DS) 800-160 MG tablet Take 1 tablet by mouth 2 (two) times daily. 5 day course prescribed on 05/04/2020 05/04/20  Yes [provider]  tamsulosin (FLOMAX) 0.4 MG CAPS capsule Take 0.4 mg by mouth daily. 05/04/20  Yes [provider]  apixaban (ELIQUIS) 5 MG TABS tablet Take 1 tablet (5 mg total) by mouth 2 (two) times daily. 06/22/20   Hosie Poisson, MD  diltiazem (CARDIZEM) 120 MG tablet Take 120 mg by mouth daily. Patient not taking: Reported on 05/11/2020 05/02/20   [provider]  FLUoxetine (PROZAC) 20 MG capsule Place 1 capsule (20 mg total) into feeding tube daily. 06/22/20   Hosie Poisson, MD  insulin aspart (NOVOLOG) 100 UNIT/ML injection Inject 0-24 Units into the skin 4 (four) times daily -  before meals and at bedtime. 06/22/20   Hosie Poisson, MD  ipratropium-albuterol (DUONEB) 0.5-2.5 (3) MG/3ML SOLN Take 3 mLs by nebulization every 4 (four) hours as needed. 06/22/20   Hosie Poisson, MD  metoprolol tartrate (LOPRESSOR) 25 mg/10 mL SUSP Place 40 mLs (100 mg total) into feeding tube 2 (two) times daily. 06/22/20   Hosie Poisson, MD  Nutritional Supplements (FEEDING SUPPLEMENT, OSMOLITE 1.5 CAL,) LIQD Place 1,000 mLs into feeding tube continuous. 06/22/20  Hosie Poisson, MD  Nutritional Supplements (FEEDING SUPPLEMENT, PROSOURCE TF,) liquid Place 45 mLs into feeding tube 2 (two) times daily. 06/22/20   Hosie Poisson, MD  sennosides (SENOKOT) 8.8 MG/5ML syrup Place 5 mLs into feeding tube at bedtime. 06/22/20   Hosie Poisson, MD  traZODone (DESYREL) 50 MG tablet Place 1 tablet (50 mg total) into feeding tube at bedtime. 06/22/20   Hosie Poisson, MD     Vital Signs: BP 117/73 (BP Location: Left Arm)    Pulse 78    Temp  98.6 F (37 C) (Oral)    Resp 19    Ht 5' 9.02" (1.753 m)    Wt 132 lb 7.9 oz (60.1 kg)    SpO2 98%    BMI 19.56 kg/m   Physical Exam Constitutional:      General: He is not in acute distress. Cardiovascular:     Rate and Rhythm: Normal rate and regular rhythm.  Pulmonary:     Effort: Pulmonary effort is normal.  Chest:     Comments: Left chest tube site without erythema, drainage, or active bleeding; approximately 400 cc thick white fluid in pleure-vac; tube to suction with (-) air leak. Skin:    General: Skin is warm and dry.  Neurological:     Mental Status: He is alert.     Comments: Seems alert and oriented x3. He is non-verbal but he made good eye contact and nodded his head appropriately.      Imaging: DG Fluoro Rm 1-60 Min  Result Date: 07/04/2020 CLINICAL DATA:  Esophageal anastomotic leak. EXAM: DG C-ARM 1-60 MIN CONTRAST:  Patient did not swallow contrast. FLUOROSCOPY TIME:  Radiation Exposure Index (if provided by the fluoroscopic device): 4.2 mGy. COMPARISON:  July 02, 2020. FINDINGS: An attempt was made to perform an esophagram. However, the patient declined to drink contrast despite repeated attempts to convince him to do so. The procedure was discontinued and the patient was returned to the floor. IMPRESSION: Unsuccessful attempt to perform esophagram as patient declined to drink contrast. Electronically Signed   By: Marijo Conception M.D.   On: 07/04/2020 10:12   HYBRID OR IMAGING ([facility] ONLY)  Result Date: 07/03/2020 There is no interpretation for this exam.  This order is for images obtained during a surgical procedure.  Please See "Surgeries" Tab for more information regarding the procedure.    Labs:  CBC: Recent Labs    07/03/20 0133 07/04/20 0149 07/05/20 0155 07/06/20 0131  WBC 6.0 5.7 4.9 5.8  HGB 9.2* 8.4* 8.5* 8.9*  HCT 31.6* 28.8* 29.7* 31.7*  PLT 558* 470* 457* 487*    COAGS: Recent Labs    05/10/20 2111 05/11/20 0536 06/02/20 1240  06/02/20 1524  INR 1.1 1.1 1.2 1.2  APTT 30 31  --  35    BMP: Recent Labs    07/02/20 0054 07/03/20 0133 07/04/20 0149 07/05/20 0155  NA 134* 134* 135 137  K 4.0 4.1 4.0 4.1  CL 99 99 101 102  CO2 27 25 27 27   GLUCOSE 118* 100* 128* 126*  BUN 13 15 14 12   CALCIUM 8.9 8.8* 8.6* 8.7*  CREATININE 0.59* 0.66 0.69 0.62  GFRNONAA >60 >60 >60 >60  GFRAA >60 >60 >60 >60    LIVER FUNCTION TESTS: Recent Labs    06/20/20 0801 06/23/20 0700 06/27/20 0031 07/02/20 0054  BILITOT 0.4 0.4 0.3 0.3  AST 61* 45* 38 29  ALT 198* 158* 124* 80*  ALKPHOS 145* 144* 140* 110  PROT 7.4 7.3 7.3 7.4  ALBUMIN 2.2* 2.0* 1.9* 2.2*    Assessment and Plan:  History of esophageal perforation complicated by development of left empyema s/p left chest tube placement in IR 06/25/2020. Left chest tube stable with approximately 400 cc thick white fluid in pleure-vac. There has been no output since 07/02/20. TCTS paged to discuss further.  Continue current tube management- tube to remain to suction at this time. Further plans per TCTS.  IR to follow.  Electronically Signed: Soyla Dryer, AGACNP-BC [phone] 07/06/2020, 3:30 PM   I spent a total of 15 Minutes at the the patient's bedside AND on the patient's hospital floor or unit, greater than 50% of which was counseling/coordinating care for left chest tube.

## 2020-07-06 NOTE — Progress Notes (Signed)
Physical Therapy Treatment Patient Details Name: Evan Chavez MRN: [id number] DOB: 05/28/1961 Today's Date: 07/06/2020    History of Present Illness Pt is 59 yo male presents to the hospital on 7/28 with generalized weakness, productive cough and low-grade fever. Pt is currently being treated for hydropneumothorax, Sepsis aspiration pneumonia and esophagal rupture, AKI and acute delirium. s/p R thoracentesis on 8/2. Chest tube placed 8/2-8/9. s/p VATS, decortication, EGD, PEG tube placement, and a esophageal stent on 8/9. PMH of GERD, Barrett's esophagus, PUD with extensive gastric erosion, duodenal ulcer and esophageal stricture, stroke, HLD, BPH, cocaine abuse. s/p Right thoracoscopy, chest tube placement, esophageal stent placement 8/13-8/20. 9/13- s/p rt chest tube placement. 9/21 pt underwent EGD with esophageal stent removal    PT Comments    Pt making steady progress with mobility. Continue to recommend SNF.    Follow Up Recommendations  SNF     Equipment Recommendations  Rolling walker with 5" wheels    Recommendations for Other Services       Precautions / Restrictions Precautions Precautions: Fall Precaution Comments: PEG, right weakness, R chest tube    Mobility  Bed Mobility Overal bed mobility: Needs Assistance Bed Mobility: Supine to Sit     Supine to sit: Supervision;HOB elevated     General bed mobility comments: Incr time and effort  Transfers Overall transfer level: Needs assistance Equipment used: Rolling walker (2 wheeled) Transfers: Sit to/from Stand Sit to Stand: Min guard         General transfer comment: Verbal cues for hand placement  Ambulation/Gait Ambulation/Gait assistance: Min guard Gait Distance (Feet): 100 Feet Assistive device: Rolling walker (2 wheeled) Gait Pattern/deviations: Step-through pattern;Decreased step length - right;Decreased step length - left;Shuffle Gait velocity: decr Gait velocity interpretation: <1.8  ft/sec, indicate of risk for recurrent falls General Gait Details: Assist for safety and lines   Stairs             Wheelchair Mobility    Modified Rankin (Stroke Patients Only)       Balance Overall balance assessment: Needs assistance Sitting-balance support: Feet supported;No upper extremity supported Sitting balance-Leahy Scale: Fair     Standing balance support: Bilateral upper extremity supported Standing balance-Leahy Scale: Poor Standing balance comment: walker and min guard for static standing                            Cognition Arousal/Alertness: Awake/alert Behavior During Therapy: Flat affect Overall Cognitive Status: No family/caregiver present to determine baseline cognitive functioning                                 General Comments: difficult to assess, non verbal, follows 1 step commands      Exercises      General Comments General comments (skin integrity, edema, etc.): VSS on RA      Pertinent Vitals/Pain Pain Assessment: No/denies pain    Home Living                      Prior Function            PT Goals (current goals can now be found in the care plan section) Acute Rehab PT Goals Patient Stated Goal: willing to get OOB  Progress towards PT goals: Progressing toward goals    Frequency    Min 2X/week      PT Plan  Current plan remains appropriate    Co-evaluation              AM-PAC PT "6 Clicks" Mobility   Outcome Measure  Help needed turning from your back to your side while in a flat bed without using bedrails?: None Help needed moving from lying on your back to sitting on the side of a flat bed without using bedrails?: None Help needed moving to and from a bed to a chair (including a wheelchair)?: A Little Help needed standing up from a chair using your arms (e.g., wheelchair or bedside chair)?: A Little Help needed to walk in hospital room?: A Little Help needed climbing  3-5 steps with a railing? : A Lot 6 Click Score: 19    End of Session   Activity Tolerance: Patient tolerated treatment well Patient left: in chair;with call bell/phone within reach;with chair alarm set Nurse Communication: Mobility status PT Visit Diagnosis: Unsteadiness on feet (R26.81);Muscle weakness (generalized) (M62.81)     Time: 7076-1518 PT Time Calculation (min) (ACUTE ONLY): 19 min  Charges:  $Gait Training: 8-22 mins                     [place]hel[place] Pager [phone] Office [place] 07/06/2020, 12:28 PM

## 2020-07-06 NOTE — Progress Notes (Signed)
PROGRESS NOTE    Evan Chavez  TKP:[id number] DOB: 10-30-1960 DOA: 05/10/2020 PCP: Rosita Fire, MD    Brief Narrative:  59 year old male with PMH of Barrett's esophagus, peptic ulcer disease with hiatal hernia and duodenal ulcer,  BPH and remote history of substance abuse who presented on 7/29 with recurrent pneumonia and parapneumonic effusion and was admitted to the hospitalist service.  Patient underwent right-sided thoracentesis on 8/2 which was complicated by pneumothorax.  A CT of the scan of the chest done 8/4 noted esophageal perforation arising from the right lateral aspect of mid to distal esophagus and large right-sided hydropneumothorax.  Patient was transferred from [facility]  on 8/4 to [facility] and underwent right sided chest tube placement.  On 8/5,  He underwent a VATS decortication, PEG tube placement and esophageal stent placement.  On 8/7 a repeat esophagram noted persistent leakage from the esophagus to the right pleural space.  On 8/13, patient underwent a right thoracoscopy with drainage of his loculated pleural effusion, and esophago-gastroscopy and esophageal stent placement.  Follow-up esophagram a few days later did not note any leak.  Patient's hospital course was also complicated by development of new onset atrial fibrillation.  Also noted to have left-sided hydronephrosis with plans for shockwave lithotripsy as outpatient.  On 8/21, patient restarted on broad-spectrum antibiotics after x-ray and CT chest noted right lower lobe pneumonia that had developed into sepsis.  CT scan of chest on 9/7 noted interval increase in loculated fluid collection with surrounding enhancing pleura indeterminate by imaging but possibly empyema.  CT surgery reconsulted initially no plans for further surgery or drainage, but then patient started developing low-grade temperature and white count and CT of abdomen/pelvis noted loculated complex pleural effusion possible abscess,  Interventional radiology consulted.  After scheduled CT surgery, patient underwent percutaneous chest tube placement.  MRSA PCR now positive and with persistent right pleural empyema, infectious disease consulted.  Cultures grew out staph aureus.  Patient has been placed on 3 drug regimen of meropenem, fluconazole and vancomycin.  Patient underwent follow-up CT on 9/20 noting very little fluid remaining.  Patient had follow-up by speech therapy and mostly noncooperative during evaluation.  Declined all offers of p.o. eventually accepting a few sips of water. On 9/21, cardiothoracic surgery removed esophageal stent.  Follow-up esophagram noted patent esophagus with no signs of leakage.  Infectious disease recommended continue three antibiotic regimen for 3 more days.  IR recommended continue current chest tube management and remain to suction at this time.  Assessment & Plan:   Principal Problem:   Pleural empyema (HCC) Active Problems:   Essential hypertension   Atrial tachycardia (HCC)   Dyslipidemia   Transaminitis   Gastroesophageal reflux disease with esophagitis   Barrett's esophagus   Thyroid nodule   BPH (benign prostatic hyperplasia)   Ureteral stone with hydronephrosis   Pneumothorax on right   Acute respiratory failure with hypoxia (HCC)   Malnutrition of moderate degree   Esophageal perforation   Pressure injury of skin   Atrial fibrillation with RVR (HCC)   Right lower lobe pulmonary infiltrate   Dysphagia   Status post thoracentesis   Chronic diastolic CHF (congestive heart failure) (HCC)   Community-acquiredpneumonia with parapneumonic effusion that caused sepsis that was present on admission that developed into hydropneumothorax and recurrent empyema causing acute respiratory failure with hypoxia: Patient initially presented with sepsis on admission given tachycardia, tachypnea, leukocytosis in the setting of pneumonia and organ dysfunction including acute respiratory  failure  and lactic acidosis.  Sepsis itself initially has been stabilized.  He since then has developed recurrent empyema is currently status post chest tube placement on 9/13 and as per infectious disease on 3 drug regimen of vancomycin, meropenem and fluconazole.  Follow-up CT notes minimal fluid remaining, although chest tube now does note some whitish drainage.  ID recommended continue 3 antibiotic regimen for 2 more days( Last day 9/26).  Esophageal perforation: >> Resolved.  Initially, CT surgery had planned to remove his esophageal stent last week and that was prior to discovery of his most recent empyema.  Esophageal stent removed 9/21.  Esophagram following notes no leakage.  Oropharyngealdysphagia: Status post PEG tube placement on tube feeds.  Seen by speech therapy and he continues to have episodes of bolus holding.  Was on clear liquids only, but after chest tube placement from recurrent empyema, changed to NPO.  Unfortunately, continues to take little in terms of p.o. and direction. He remains on tube feeds only.  Paroxysmal atrial fibrillation: Continues to remain rate controlled.  Continue apixaban and metoprolol. Echocardiogram showed preserved EF without any wall motion abnormality or valvular abnormality.  Had been seen by cardiology.  Chronic diastolic heart failure:  Noted on echocardiogram done 7/30.  BNP on 9/20 at 25.  Remains euvolemic.  Acute on chronic blood lossanemia Hgb stable.  Today at 8.9.  Cerebrovascular disease, secondary prevention LFTs stable, Lipitor resumed.   Moderate protein calorie malnutrition Body mass index is 19.6.  Patient meets criteria in the context of chronic illness (Barrett's esophagus and GERD) as evidenced by moderate fat and muscle depletion.  Being followed by nutrition.  Currently on Osmolite plus Prosource tube feeds.  Left-sided hydronephrosis with ureteral stone: Initially seen on presentation and urology consulted.   Outpatient follow-up with urology to do lithotripsy   Depression: -ContinueProzac   DVT prophylaxis: Eliquis Code Status: Full Family Communication: No one was at bed side. Disposition Plan:  Status is: Inpatient  Remains inpatient appropriate because:Inpatient level of care appropriate due to severity of illness   Dispo:  Patient From:   Home  Planned Disposition: [place]  Expected discharge date: 3 days  Medically stable for discharge:   Patient is not medically clear  Consultants:   Critical care  Cardiothoracic surgery  Interventional radiology  Infectious disease  Urology  Nutrition  Cardiology  Procedures:   Status post thoracentesis done 8/2.  Initial chest tube placement  VATS decortication  PEG tube placement  Esophageal stent placement  Right-sided thoracoscopy with drainage of loculated effusion  second esophageal stent placement with removal on 9/22  Second chest tube placement  Echocardiogram done 6/57 noting diastolic dysfunction and preserved ejection fraction   Antimicrobials:  Anti-infectives (From admission, onward)   Start     Dose/Rate Route Frequency Ordered Stop   07/06/20 0600  vancomycin (VANCOREADY) IVPB 750 mg/150 mL        750 mg 150 mL/hr over 60 Minutes Intravenous Every 12 hours 07/05/20 1553 07/09/20 0559   06/28/20 2300  vancomycin (VANCOREADY) IVPB 750 mg/150 mL  Status:  Discontinued        750 mg 150 mL/hr over 60 Minutes Intravenous Every 8 hours 06/28/20 1519 07/05/20 1553   06/26/20 1300  fluconazole (DIFLUCAN) IVPB 400 mg        400 mg 100 mL/hr over 120 Minutes Intravenous Every 24 hours 06/25/20 1513 07/08/20 2359   06/26/20 0100  vancomycin (VANCOREADY) IVPB 500 mg/100 mL  Status:  Discontinued  500 mg 100 mL/hr over 60 Minutes Intravenous Every 12 hours 06/25/20 1240 06/28/20 1519   06/25/20 1600  meropenem (MERREM) 1 g in sodium chloride 0.9 % 100 mL IVPB        1 g 200  mL/hr over 30 Minutes Intravenous Every 8 hours 06/25/20 1513 07/08/20 2359   06/25/20 1600  fluconazole (DIFLUCAN) IVPB 200 mg        200 mg 100 mL/hr over 60 Minutes Intravenous  Once 06/25/20 1515 06/25/20 1902   06/25/20 1300  fluconazole (DIFLUCAN) tablet 200 mg        200 mg Oral  Once 06/25/20 1209 06/25/20 1308   06/25/20 1245  vancomycin (VANCOREADY) IVPB 1250 mg/250 mL        1,250 mg 166.7 mL/hr over 90 Minutes Intravenous  Once 06/25/20 1238 06/25/20 1445   06/22/20 2030  ceFEPIme (MAXIPIME) 2 g in sodium chloride 0.9 % 100 mL IVPB  Status:  Discontinued        2 g 200 mL/hr over 30 Minutes Intravenous Every 8 hours 06/22/20 2020 06/25/20 1432   06/03/20 0200  vancomycin (VANCOCIN) IVPB 1000 mg/200 mL premix  Status:  Discontinued        1,000 mg 200 mL/hr over 60 Minutes Intravenous Every 12 hours 06/02/20 1248 06/05/20 0915   06/02/20 2200  ceFEPIme (MAXIPIME) 2 g in sodium chloride 0.9 % 100 mL IVPB        2 g 200 mL/hr over 30 Minutes Intravenous Every 8 hours 06/02/20 1251 06/08/20 2359   06/02/20 2000  piperacillin-tazobactam (ZOSYN) IVPB 3.375 g  Status:  Discontinued        3.375 g 12.5 mL/hr over 240 Minutes Intravenous Every 8 hours 06/02/20 1248 06/02/20 1249   06/02/20 1330  vancomycin (VANCOREADY) IVPB 1250 mg/250 mL        1,250 mg 166.7 mL/hr over 90 Minutes Intravenous  Once 06/02/20 1248 06/02/20 1646   06/02/20 1330  piperacillin-tazobactam (ZOSYN) IVPB 3.375 g  Status:  Discontinued        3.375 g 100 mL/hr over 30 Minutes Intravenous  Once 06/02/20 1248 06/02/20 1249   06/02/20 1215  vancomycin (VANCOCIN) IVPB 1000 mg/200 mL premix  Status:  Discontinued        1,000 mg 200 mL/hr over 60 Minutes Intravenous  Once 06/02/20 1202 06/02/20 1251   06/02/20 1215  ceFEPIme (MAXIPIME) 2 g in sodium chloride 0.9 % 100 mL IVPB        2 g 200 mL/hr over 30 Minutes Intravenous  Once 06/02/20 1202 06/02/20 1506   05/29/20 1800  vancomycin (VANCOCIN) IVPB 1000  mg/200 mL premix  Status:  Discontinued        1,000 mg 200 mL/hr over 60 Minutes Intravenous Every 12 hours 05/29/20 1151 05/30/20 0912   05/24/20 2100  vancomycin (VANCOREADY) IVPB 750 mg/150 mL  Status:  Discontinued        750 mg 150 mL/hr over 60 Minutes Intravenous Every 12 hours 05/24/20 0813 05/29/20 1151   05/24/20 0900  vancomycin (VANCOREADY) IVPB 1500 mg/300 mL        1,500 mg 150 mL/hr over 120 Minutes Intravenous  Once 05/24/20 0813 05/24/20 1221   05/17/20 0900  fluconazole (DIFLUCAN) IVPB 400 mg  Status:  Discontinued        400 mg 100 mL/hr over 120 Minutes Intravenous Every 24 hours 05/17/20 0749 06/05/20 0915   05/17/20 0600  vancomycin (VANCOREADY) IVPB 750 mg/150 mL  Status:  Discontinued        750 mg 150 mL/hr over 60 Minutes Intravenous Every 8 hours 05/16/20 2154 05/17/20 0903   05/17/20 0400  vancomycin (VANCOREADY) IVPB 750 mg/150 mL  Status:  Discontinued        750 mg 150 mL/hr over 60 Minutes Intravenous Every 8 hours 05/16/20 1945 05/16/20 2154   05/16/20 2200  vancomycin (VANCOREADY) IVPB 1250 mg/250 mL        1,250 mg 166.7 mL/hr over 90 Minutes Intravenous STAT 05/16/20 2153 05/16/20 2348   05/16/20 2000  vancomycin (VANCOREADY) IVPB 1250 mg/250 mL  Status:  Discontinued        1,250 mg 166.7 mL/hr over 90 Minutes Intravenous  Once 05/16/20 1945 05/16/20 2153   05/15/20 1600  piperacillin-tazobactam (ZOSYN) IVPB 3.375 g  Status:  Discontinued        3.375 g 12.5 mL/hr over 240 Minutes Intravenous Every 8 hours 05/15/20 0945 05/30/20 0912   05/15/20 0930  piperacillin-tazobactam (ZOSYN) IVPB 3.375 g        3.375 g 100 mL/hr over 30 Minutes Intravenous  Once 05/15/20 0921 05/15/20 1009   05/13/20 2000  azithromycin (ZITHROMAX) tablet 500 mg        500 mg Oral Daily 05/13/20 1155 05/14/20 2127   05/10/20 2200  cefTRIAXone (ROCEPHIN) 2 g in sodium chloride 0.9 % 100 mL IVPB  Status:  Discontinued        2 g 200 mL/hr over 30 Minutes Intravenous Every 24  hours 05/10/20 2148 05/15/20 0911   05/10/20 2200  azithromycin (ZITHROMAX) 500 mg in sodium chloride 0.9 % 250 mL IVPB  Status:  Discontinued        500 mg 250 mL/hr over 60 Minutes Intravenous Every 24 hours 05/10/20 2148 05/13/20 1155     Subjective: Patient was seen and examined at bedside.  Overnight events noted.  He appears alert , comfortable. Has a lot of secretions in the throat, that were suctioned.  He has chest tube with minimal drainage. RN reports no concerns.  Objective: Vitals:   07/05/20 1920 07/05/20 2302 07/06/20 0304 07/06/20 0731  BP:  (!) 135/95 117/73   Pulse:  75 72 78  Resp:  18 19   Temp:  98.4 F (36.9 C) 98.4 F (36.9 C) 98.5 F (36.9 C)  TempSrc:  Oral Oral Oral  SpO2: 100% 98% 99% 98%  Weight:   60.1 kg   Height:        Intake/Output Summary (Last 24 hours) at 07/06/2020 1318 Last data filed at 07/06/2020 1100 Gross per 24 hour  Intake 1709 ml  Output 1202 ml  Net 507 ml   Filed Weights   07/04/20 0500 07/05/20 0237 07/06/20 0304  Weight: 60.4 kg 60.4 kg 60.1 kg    Examination:  General exam: Appears calm and comfortable, Chest tube noted.  Respiratory system: Clear to auscultation. Respiratory effort normal. Cardiovascular system: S1 & S2 heard, RRR. No JVD, murmurs, rubs, gallops or clicks. No pedal edema. Gastrointestinal system: Abdomen is nondistended, soft and nontender. No organomegaly or masses felt. Peg tube noted. bowel sounds heard. Central nervous system: Alert and oriented. No focal neurological deficits. Moves all extremities. Extremities: no edema. No clubbing, no cyanosis Skin: No rashes, lesions or ulcers Psychiatry: Mood & affect appropriate.     Data Reviewed: I have personally reviewed following labs and imaging studies  CBC: Recent Labs  Lab 07/01/20 0336 07/01/20 0336 07/02/20 0054 07/03/20 0133 07/04/20 0149 07/05/20 0155 07/06/20  0131  WBC 7.0   < > 6.6 6.0 5.7 4.9 5.8  NEUTROABS 4.1  --  3.4 3.0  3.2 2.6  --   HGB 9.4*   < > 8.9* 9.2* 8.4* 8.5* 8.9*  HCT 33.1*   < > 30.6* 31.6* 28.8* 29.7* 31.7*  MCV 81.1   < > 80.3 80.2 80.4 79.6* 80.3  PLT 620*   < > 548* 558* 470* 457* 487*   < > = values in this interval not displayed.   Basic Metabolic Panel: Recent Labs  Lab 07/01/20 0336 07/02/20 0054 07/03/20 0133 07/04/20 0149 07/05/20 0155  NA 134* 134* 134* 135 137  K 4.3 4.0 4.1 4.0 4.1  CL 99 99 99 101 102  CO2 27 27 25 27 27   GLUCOSE 104* 118* 100* 128* 126*  BUN 11 13 15 14 12   CREATININE 0.67 0.59* 0.66 0.69 0.62  CALCIUM 9.0 8.9 8.8* 8.6* 8.7*   GFR: Estimated Creatinine Clearance: 83.5 mL/min (by C-G formula based on SCr of 0.62 mg/dL). Liver Function Tests: Recent Labs  Lab 07/02/20 0054  AST 29  ALT 80*  ALKPHOS 110  BILITOT 0.3  PROT 7.4  ALBUMIN 2.2*   No results for input(s): LIPASE, AMYLASE in the last 168 hours. No results for input(s): AMMONIA in the last 168 hours. Coagulation Profile: No results for input(s): INR, PROTIME in the last 168 hours. Cardiac Enzymes: No results for input(s): CKTOTAL, CKMB, CKMBINDEX, TROPONINI in the last 168 hours. BNP (last 3 results) No results for input(s): PROBNP in the last 8760 hours. HbA1C: No results for input(s): HGBA1C in the last 72 hours. CBG: Recent Labs  Lab 07/05/20 1138 07/05/20 1642 07/05/20 2159 07/06/20 0625 07/06/20 1203  GLUCAP 118* 112* 88 133* 128*   Lipid Profile: No results for input(s): CHOL, HDL, LDLCALC, TRIG, CHOLHDL, LDLDIRECT in the last 72 hours. Thyroid Function Tests: No results for input(s): TSH, T4TOTAL, FREET4, T3FREE, THYROIDAB in the last 72 hours. Anemia Panel: No results for input(s): VITAMINB12, FOLATE, FERRITIN, TIBC, IRON, RETICCTPCT in the last 72 hours. Sepsis Labs: Recent Labs  Lab 07/02/20 0054  PROCALCITON <0.10    No results found for this or any previous visit (from the past 240 hour(s)).   Radiology Studies: No results found.  Scheduled  Meds: . apixaban  5 mg Per Tube BID  . atorvastatin  40 mg Per Tube Daily  . feeding supplement (PROSource TF)  45 mL Per Tube BID  . FLUoxetine  20 mg Per Tube Daily  . insulin aspart  0-24 Units Subcutaneous TID AC & HS  . mouth rinse  15 mL Mouth Rinse q12n4p  . metoprolol tartrate  100 mg Per Tube BID  . nystatin  5 mL Mouth/Throat QID  . pantoprazole sodium  40 mg Per Tube BID  . sennosides  5 mL Per Tube QHS  . traZODone  50 mg Per Tube QHS   Continuous Infusions: . sodium chloride 1,000 mL (06/06/20 0028)  . sodium chloride    . feeding supplement (OSMOLITE 1.5 CAL) 1,000 mL (07/06/20 0912)  . fluconazole (DIFLUCAN) IV 400 mg (07/06/20 1211)  . meropenem (MERREM) IV 1 g (07/06/20 0923)  . vancomycin 750 mg (07/06/20 0549)     LOS: 56 days    Time spent: 25 mins.    Shawna Clamp, MD [facility]   If 7PM-7AM, please contact night-coverage

## 2020-07-07 LAB — GLUCOSE, CAPILLARY
Glucose-Capillary: 129 mg/dL — ABNORMAL HIGH (ref 70–99)
Glucose-Capillary: 133 mg/dL — ABNORMAL HIGH (ref 70–99)
Glucose-Capillary: 114 mg/dL — ABNORMAL HIGH (ref 70–99)
Glucose-Capillary: 97 mg/dL (ref 70–99)

## 2020-07-07 NOTE — Plan of Care (Signed)

## 2020-07-07 NOTE — Progress Notes (Signed)
PROGRESS NOTE    Evan Chavez  ZDG:[id number] DOB: Sep 18, 1961 DOA: 05/10/2020 PCP: Rosita Fire, MD    Brief Narrative:  59 year old male with PMH of Barrett's esophagus, peptic ulcer disease with hiatal hernia and duodenal ulcer,  BPH and remote history of substance abuse who presented on 7/29 with recurrent pneumonia and parapneumonic effusion and was admitted to the hospitalist service.  Patient underwent right-sided thoracentesis on 8/2 which was complicated by pneumothorax.  A CT of the scan of the chest done 8/4 noted esophageal perforation arising from the right lateral aspect of mid to distal esophagus and large right-sided hydropneumothorax.  Patient was transferred from [facility] on 8/4 to [facility] and underwent right sided chest tube placement.  On 8/5,  He underwent a VATS decortication, PEG tube placement and esophageal stent placement.  On 8/7 a repeat esophagram noted persistent leakage from the esophagus to the right pleural space.  On 8/13, patient underwent a right thoracoscopy with drainage of his loculated pleural effusion, and esophago-gastroscopy and esophageal stent placement.  Follow-up esophagram a few days later did not note any leak.  Patient's hospital course was also complicated by development of new onset atrial fibrillation.  Also noted to have left-sided hydronephrosis with plans for shockwave lithotripsy as outpatient.  On 8/21, patient restarted on broad-spectrum antibiotics after x-ray and CT chest noted right lower lobe pneumonia that had developed into sepsis.  CT scan of chest on 9/7 noted interval increase in loculated fluid collection with surrounding enhancing pleura indeterminate by imaging but possibly empyema.  CT surgery reconsulted initially no plans for further surgery or drainage, but then patient started developing low-grade temperature and white count and CT of abdomen/pelvis noted loculated complex pleural effusion possible abscess,  Interventional radiology consulted.  After scheduled CT surgery, patient underwent percutaneous chest tube placement.  MRSA PCR now positive and with persistent right pleural empyema, infectious disease consulted.  Cultures grew out staph aureus.  Patient has been placed on 3 drug regimen of meropenem, fluconazole and vancomycin.  Patient underwent follow-up CT on 9/20 noting very little fluid remaining.  Patient had follow-up by speech therapy and mostly noncooperative during evaluation.  Declined all offers of p.o. eventually accepting a few sips of water. On 9/21, cardiothoracic surgery removed esophageal stent.  Follow-up esophagram noted patent esophagus with no signs of leakage.  Infectious disease recommended continue three antibiotic regimen for 3 more days.  IR recommended continue current chest tube management and remain to suction at this time.  Assessment & Plan:   Principal Problem:   Pleural empyema (HCC) Active Problems:   Essential hypertension   Atrial tachycardia (HCC)   Dyslipidemia   Transaminitis   Gastroesophageal reflux disease with esophagitis   Barrett's esophagus   Thyroid nodule   BPH (benign prostatic hyperplasia)   Ureteral stone with hydronephrosis   Pneumothorax on right   Acute respiratory failure with hypoxia (HCC)   Malnutrition of moderate degree   Esophageal perforation   Pressure injury of skin   Atrial fibrillation with RVR (HCC)   Right lower lobe pulmonary infiltrate   Dysphagia   Status post thoracentesis   Chronic diastolic CHF (congestive heart failure) (HCC)   Sepsis sec. to Community-acquiredpneumonia with parapneumonic effusion that was present on admission that developed into hydropneumothorax and recurrent empyema causing acute respiratory failure with hypoxia: Patient initially presented with sepsis on admission given tachycardia, tachypnea, leukocytosis in the setting of pneumonia and organ dysfunction including acute respiratory  failure and  lactic acidosis.  Sepsis itself initially has been stabilized.  He since then has developed recurrent empyema is currently status post chest tube placement on 9/13 and as per infectious disease on 3 drug regimen of vancomycin, meropenem and fluconazole.  Follow-up CT notes, minimal fluid remaining,  chest tube has No output since 9/20.  ID recommended continue 3 antibiotic regimen for 1 more days( Last day 9/26).  Esophageal perforation: >> Resolved.  Initially, CT surgery had planned to remove his esophageal stent last week and that was prior to discovery of his most recent empyema.  Esophageal stent removed 9/21.  Esophagram following notes no leakage.  Oropharyngealdysphagia: Status post PEG tube placement on tube feeds.  Seen by speech therapy and he continues to have episodes of bolus holding.  Was on clear liquids only, but after chest tube placement from recurrent empyema, changed to NPO.  Unfortunately, continues to take little in terms of p.o. and direction. He remains on tube feeds only.  Paroxysmal atrial fibrillation: Continues to remain rate controlled.  Continue apixaban and metoprolol. Echocardiogram showed preserved EF without any wall motion abnormality or valvular abnormality.  Had been seen by cardiology.  Chronic diastolic heart failure:  Noted on echocardiogram done 7/30.  BNP on 9/20 at 25.  Remains euvolemic.  Acute on chronic blood lossanemia Hgb stable.  Today at 8.9.  Cerebrovascular disease, secondary prevention LFTs stable, Lipitor resumed.   Moderate protein calorie malnutrition Body mass index is 19.6.  Patient meets criteria in the context of chronic illness (Barrett's esophagus and GERD) as evidenced by moderate fat and muscle depletion.  Being followed by nutrition.  Currently on Osmolite plus Prosource tube feeds.  Left-sided hydronephrosis with ureteral stone: Initially seen on presentation and urology consulted.  Outpatient follow-up with  urology to do lithotripsy   Depression: -ContinueProzac   DVT prophylaxis: Eliquis Code Status: Full Family Communication: No one was at bed side. Disposition Plan:  Status is: Inpatient  Remains inpatient appropriate because:Inpatient level of care appropriate due to severity of illness   Dispo:  Patient From:   Home  Planned Disposition: [place]  Expected discharge date: 3 days  Medically stable for discharge:   Patient is not medically clear  Consultants:   Critical care  Cardiothoracic surgery  Interventional radiology  Infectious disease  Urology  Nutrition  Cardiology  Procedures:   Status post thoracentesis done 8/2.  Initial chest tube placement  VATS decortication  PEG tube placement  Esophageal stent placement  Right-sided thoracoscopy with drainage of loculated effusion  second esophageal stent placement with removal on 9/22  Second chest tube placement  Echocardiogram done 3/76 noting diastolic dysfunction and preserved ejection fraction   Antimicrobials:  Anti-infectives (From admission, onward)   Start     Dose/Rate Route Frequency Ordered Stop   07/06/20 0600  vancomycin (VANCOREADY) IVPB 750 mg/150 mL        750 mg 150 mL/hr over 60 Minutes Intravenous Every 12 hours 07/05/20 1553 07/09/20 0559   06/28/20 2300  vancomycin (VANCOREADY) IVPB 750 mg/150 mL  Status:  Discontinued        750 mg 150 mL/hr over 60 Minutes Intravenous Every 8 hours 06/28/20 1519 07/05/20 1553   06/26/20 1300  fluconazole (DIFLUCAN) IVPB 400 mg        400 mg 100 mL/hr over 120 Minutes Intravenous Every 24 hours 06/25/20 1513 07/08/20 2359   06/26/20 0100  vancomycin (VANCOREADY) IVPB 500 mg/100 mL  Status:  Discontinued  500 mg 100 mL/hr over 60 Minutes Intravenous Every 12 hours 06/25/20 1240 06/28/20 1519   06/25/20 1600  meropenem (MERREM) 1 g in sodium chloride 0.9 % 100 mL IVPB        1 g 200 mL/hr over 30 Minutes  Intravenous Every 8 hours 06/25/20 1513 07/08/20 2359   06/25/20 1600  fluconazole (DIFLUCAN) IVPB 200 mg        200 mg 100 mL/hr over 60 Minutes Intravenous  Once 06/25/20 1515 06/25/20 1902   06/25/20 1300  fluconazole (DIFLUCAN) tablet 200 mg        200 mg Oral  Once 06/25/20 1209 06/25/20 1308   06/25/20 1245  vancomycin (VANCOREADY) IVPB 1250 mg/250 mL        1,250 mg 166.7 mL/hr over 90 Minutes Intravenous  Once 06/25/20 1238 06/25/20 1445   06/22/20 2030  ceFEPIme (MAXIPIME) 2 g in sodium chloride 0.9 % 100 mL IVPB  Status:  Discontinued        2 g 200 mL/hr over 30 Minutes Intravenous Every 8 hours 06/22/20 2020 06/25/20 1432   06/03/20 0200  vancomycin (VANCOCIN) IVPB 1000 mg/200 mL premix  Status:  Discontinued        1,000 mg 200 mL/hr over 60 Minutes Intravenous Every 12 hours 06/02/20 1248 06/05/20 0915   06/02/20 2200  ceFEPIme (MAXIPIME) 2 g in sodium chloride 0.9 % 100 mL IVPB        2 g 200 mL/hr over 30 Minutes Intravenous Every 8 hours 06/02/20 1251 06/08/20 2359   06/02/20 2000  piperacillin-tazobactam (ZOSYN) IVPB 3.375 g  Status:  Discontinued        3.375 g 12.5 mL/hr over 240 Minutes Intravenous Every 8 hours 06/02/20 1248 06/02/20 1249   06/02/20 1330  vancomycin (VANCOREADY) IVPB 1250 mg/250 mL        1,250 mg 166.7 mL/hr over 90 Minutes Intravenous  Once 06/02/20 1248 06/02/20 1646   06/02/20 1330  piperacillin-tazobactam (ZOSYN) IVPB 3.375 g  Status:  Discontinued        3.375 g 100 mL/hr over 30 Minutes Intravenous  Once 06/02/20 1248 06/02/20 1249   06/02/20 1215  vancomycin (VANCOCIN) IVPB 1000 mg/200 mL premix  Status:  Discontinued        1,000 mg 200 mL/hr over 60 Minutes Intravenous  Once 06/02/20 1202 06/02/20 1251   06/02/20 1215  ceFEPIme (MAXIPIME) 2 g in sodium chloride 0.9 % 100 mL IVPB        2 g 200 mL/hr over 30 Minutes Intravenous  Once 06/02/20 1202 06/02/20 1506   05/29/20 1800  vancomycin (VANCOCIN) IVPB 1000 mg/200 mL premix  Status:   Discontinued        1,000 mg 200 mL/hr over 60 Minutes Intravenous Every 12 hours 05/29/20 1151 05/30/20 0912   05/24/20 2100  vancomycin (VANCOREADY) IVPB 750 mg/150 mL  Status:  Discontinued        750 mg 150 mL/hr over 60 Minutes Intravenous Every 12 hours 05/24/20 0813 05/29/20 1151   05/24/20 0900  vancomycin (VANCOREADY) IVPB 1500 mg/300 mL        1,500 mg 150 mL/hr over 120 Minutes Intravenous  Once 05/24/20 0813 05/24/20 1221   05/17/20 0900  fluconazole (DIFLUCAN) IVPB 400 mg  Status:  Discontinued        400 mg 100 mL/hr over 120 Minutes Intravenous Every 24 hours 05/17/20 0749 06/05/20 0915   05/17/20 0600  vancomycin (VANCOREADY) IVPB 750 mg/150 mL  Status:  Discontinued        750 mg 150 mL/hr over 60 Minutes Intravenous Every 8 hours 05/16/20 2154 05/17/20 0903   05/17/20 0400  vancomycin (VANCOREADY) IVPB 750 mg/150 mL  Status:  Discontinued        750 mg 150 mL/hr over 60 Minutes Intravenous Every 8 hours 05/16/20 1945 05/16/20 2154   05/16/20 2200  vancomycin (VANCOREADY) IVPB 1250 mg/250 mL        1,250 mg 166.7 mL/hr over 90 Minutes Intravenous STAT 05/16/20 2153 05/16/20 2348   05/16/20 2000  vancomycin (VANCOREADY) IVPB 1250 mg/250 mL  Status:  Discontinued        1,250 mg 166.7 mL/hr over 90 Minutes Intravenous  Once 05/16/20 1945 05/16/20 2153   05/15/20 1600  piperacillin-tazobactam (ZOSYN) IVPB 3.375 g  Status:  Discontinued        3.375 g 12.5 mL/hr over 240 Minutes Intravenous Every 8 hours 05/15/20 0945 05/30/20 0912   05/15/20 0930  piperacillin-tazobactam (ZOSYN) IVPB 3.375 g        3.375 g 100 mL/hr over 30 Minutes Intravenous  Once 05/15/20 0921 05/15/20 1009   05/13/20 2000  azithromycin (ZITHROMAX) tablet 500 mg        500 mg Oral Daily 05/13/20 1155 05/14/20 2127   05/10/20 2200  cefTRIAXone (ROCEPHIN) 2 g in sodium chloride 0.9 % 100 mL IVPB  Status:  Discontinued        2 g 200 mL/hr over 30 Minutes Intravenous Every 24 hours 05/10/20 2148  05/15/20 0911   05/10/20 2200  azithromycin (ZITHROMAX) 500 mg in sodium chloride 0.9 % 250 mL IVPB  Status:  Discontinued        500 mg 250 mL/hr over 60 Minutes Intravenous Every 24 hours 05/10/20 2148 05/13/20 1155     Subjective: Patient was seen and examined at bedside.  Overnight events noted.  He only nods his head with the yes and no answers. He appears alert , comfortable. He follows commands. Chest tube has no output. RN reports no concerns.  Objective: Vitals:   07/06/20 2355 07/07/20 0413 07/07/20 0415 07/07/20 0719  BP: (!) 131/93 (!) 138/91 (!) 135/98 (!) 133/93  Pulse:  79 76 74  Resp: 18 20  20   Temp: 97.7 F (36.5 C) 98 F (36.7 C)  98.9 F (37.2 C)  TempSrc: Oral Oral  Oral  SpO2: 98% 98% 98% 94%  Weight:  59.9 kg    Height:        Intake/Output Summary (Last 24 hours) at 07/07/2020 1131 Last data filed at 07/07/2020 6237 Gross per 24 hour  Intake 1373.85 ml  Output 1050 ml  Net 323.85 ml   Filed Weights   07/05/20 0237 07/06/20 0304 07/07/20 0413  Weight: 60.4 kg 60.1 kg 59.9 kg    Examination:  General exam: Appears calm and comfortable, Chest tube noted.  Respiratory system: Clear to auscultation. Respiratory effort normal. Cardiovascular system: S1 & S2 heard, RRR. No JVD, murmurs, rubs, gallops or clicks. No pedal edema. Gastrointestinal system: Abdomen is nondistended, soft and nontender. No organomegaly or masses felt. Peg tube noted. bowel sounds heard. Central nervous system: Alert and oriented. No focal neurological deficits. Moves all extremities. Extremities: no edema. No clubbing, no cyanosis Skin: No rashes, lesions or ulcers Psychiatry: Mood & affect appropriate.     Data Reviewed: I have personally reviewed following labs and imaging studies  CBC: Recent Labs  Lab 07/01/20 0336 07/01/20 0336 07/02/20 0054 07/03/20 0133 07/04/20 0149  07/05/20 0155 07/06/20 0131  WBC 7.0   < > 6.6 6.0 5.7 4.9 5.8  NEUTROABS 4.1  --  3.4  3.0 3.2 2.6  --   HGB 9.4*   < > 8.9* 9.2* 8.4* 8.5* 8.9*  HCT 33.1*   < > 30.6* 31.6* 28.8* 29.7* 31.7*  MCV 81.1   < > 80.3 80.2 80.4 79.6* 80.3  PLT 620*   < > 548* 558* 470* 457* 487*   < > = values in this interval not displayed.   Basic Metabolic Panel: Recent Labs  Lab 07/01/20 0336 07/02/20 0054 07/03/20 0133 07/04/20 0149 07/05/20 0155  NA 134* 134* 134* 135 137  K 4.3 4.0 4.1 4.0 4.1  CL 99 99 99 101 102  CO2 27 27 25 27 27   GLUCOSE 104* 118* 100* 128* 126*  BUN 11 13 15 14 12   CREATININE 0.67 0.59* 0.66 0.69 0.62  CALCIUM 9.0 8.9 8.8* 8.6* 8.7*   GFR: Estimated Creatinine Clearance: 83.2 mL/min (by C-G formula based on SCr of 0.62 mg/dL). Liver Function Tests: Recent Labs  Lab 07/02/20 0054  AST 29  ALT 80*  ALKPHOS 110  BILITOT 0.3  PROT 7.4  ALBUMIN 2.2*   No results for input(s): LIPASE, AMYLASE in the last 168 hours. No results for input(s): AMMONIA in the last 168 hours. Coagulation Profile: No results for input(s): INR, PROTIME in the last 168 hours. Cardiac Enzymes: No results for input(s): CKTOTAL, CKMB, CKMBINDEX, TROPONINI in the last 168 hours. BNP (last 3 results) No results for input(s): PROBNP in the last 8760 hours. HbA1C: No results for input(s): HGBA1C in the last 72 hours. CBG: Recent Labs  Lab 07/06/20 0625 07/06/20 1203 07/06/20 1619 07/06/20 2112 07/07/20 0623  GLUCAP 133* 128* 109* 123* 129*   Lipid Profile: No results for input(s): CHOL, HDL, LDLCALC, TRIG, CHOLHDL, LDLDIRECT in the last 72 hours. Thyroid Function Tests: No results for input(s): TSH, T4TOTAL, FREET4, T3FREE, THYROIDAB in the last 72 hours. Anemia Panel: No results for input(s): VITAMINB12, FOLATE, FERRITIN, TIBC, IRON, RETICCTPCT in the last 72 hours. Sepsis Labs: Recent Labs  Lab 07/02/20 0054  PROCALCITON <0.10    No results found for this or any previous visit (from the past 240 hour(s)).   Radiology Studies: No results found.  Scheduled  Meds:  apixaban  5 mg Per Tube BID   atorvastatin  40 mg Per Tube Daily   feeding supplement (PROSource TF)  45 mL Per Tube BID   FLUoxetine  20 mg Per Tube Daily   insulin aspart  0-24 Units Subcutaneous TID AC & HS   mouth rinse  15 mL Mouth Rinse q12n4p   metoprolol tartrate  100 mg Per Tube BID   nystatin  5 mL Mouth/Throat QID   pantoprazole sodium  40 mg Per Tube BID   sennosides  5 mL Per Tube QHS   traZODone  50 mg Per Tube QHS   Continuous Infusions:  sodium chloride 1,000 mL (06/06/20 0028)   sodium chloride     feeding supplement (OSMOLITE 1.5 CAL) 60 mL/hr at 07/07/20 0400   fluconazole (DIFLUCAN) IV 400 mg (07/06/20 1211)   meropenem (MERREM) IV 1 g (07/07/20 0822)   vancomycin 750 mg (07/07/20 0623)     LOS: 57 days    Time spent: 25 mins.    Shawna Clamp, MD [facility]   If 7PM-7AM, please contact night-coverage

## 2020-07-08 ENCOUNTER — Inpatient Hospital Stay (HOSPITAL_COMMUNITY): Payer: Medicaid Other

## 2020-07-08 LAB — BASIC METABOLIC PANEL
Anion gap: 8 (ref 5–15)
BUN: 14 mg/dL (ref 6–20)
CO2: 27 mmol/L (ref 22–32)
Calcium: 9.3 mg/dL (ref 8.9–10.3)
Chloride: 102 mmol/L (ref 98–111)
Creatinine, Ser: 0.72 mg/dL (ref 0.61–1.24)
GFR calc Af Amer: >60 mL/min (ref >60)
GFR calc non Af Amer: >60 mL/min (ref >60)
Glucose, Bld: 104 mg/dL — ABNORMAL HIGH (ref 70–99)
Potassium: 4.4 mmol/L (ref 3.5–5.1)
Sodium: 137 mmol/L (ref 135–145)

## 2020-07-08 LAB — CBC
HCT: 35 % — ABNORMAL LOW (ref 39.0–52.0)
Hemoglobin: 9.8 g/dL — ABNORMAL LOW (ref 13.0–17.0)
MCH: 22.4 pg — ABNORMAL LOW (ref 26.0–34.0)
MCHC: 28 g/dL — ABNORMAL LOW (ref 30.0–36.0)
MCV: 79.9 fL — ABNORMAL LOW (ref 80.0–100.0)
Platelets: 421 10*3/uL — ABNORMAL HIGH (ref 150–400)
RBC: 4.38 MIL/uL (ref 4.22–5.81)
RDW: 16.5 % — ABNORMAL HIGH (ref 11.5–15.5)
WBC: 6.1 10*3/uL (ref 4.0–10.5)
nRBC: 0 % (ref 0.0–0.2)

## 2020-07-08 LAB — GLUCOSE, CAPILLARY
Glucose-Capillary: 135 mg/dL — ABNORMAL HIGH (ref 70–99)
Glucose-Capillary: 118 mg/dL — ABNORMAL HIGH (ref 70–99)
Glucose-Capillary: 112 mg/dL — ABNORMAL HIGH (ref 70–99)
Glucose-Capillary: 117 mg/dL — ABNORMAL HIGH (ref 70–99)

## 2020-07-08 LAB — PHOSPHORUS: Phosphorus: 3.2 mg/dL (ref 2.5–4.6)

## 2020-07-08 LAB — MAGNESIUM: Magnesium: 1.9 mg/dL (ref 1.7–2.4)

## 2020-07-08 NOTE — Progress Notes (Signed)
Occupational Therapy Treatment Patient Details Name: Evan Chavez MRN: [id number] DOB: 09/13/1961 Today's Date: 07/08/2020    History of present illness Pt is 59 yo male presents to the hospital on 7/28 with generalized weakness, productive cough and low-grade fever. Pt is currently being treated for hydropneumothorax, Sepsis aspiration pneumonia and esophagal rupture, AKI and acute delirium. s/p R thoracentesis on 8/2. Chest tube placed 8/2-8/9. s/p VATS, decortication, EGD, PEG tube placement, and a esophageal stent on 8/9. PMH of GERD, Barrett's esophagus, PUD with extensive gastric erosion, duodenal ulcer and esophageal stricture, stroke, HLD, BPH, cocaine abuse. s/p Right thoracoscopy, chest tube placement, esophageal stent placement 8/13-8/20. 9/13- s/p rt chest tube placement. 9/21 pt underwent EGD with esophageal stent removal   OT comments  Patient continues to make  progress towards goals in skilled OT session. Patient's session encompassed BLE exercises in the chair and multiple bouts of sit<>stand transfers as pt declined further mobility, ADLs, and was wanting to get back in bed. Therapist was able to convince pt to stay OOB after therapy session in hopes of increasing overall strength and activity tolerance. Discharge remains appropriate, will continue to follow acutely.    Follow Up Recommendations  SNF;Supervision/Assistance - 24 hour    Equipment Recommendations  Other (comment) (defer to next venue)    Recommendations for Other Services      Precautions / Restrictions Precautions Precautions: Fall Precaution Comments: PEG, right weakness, R chest tube Restrictions Weight Bearing Restrictions: No       Mobility Bed Mobility               General bed mobility comments: Up in chair upon arrival  Transfers Overall transfer level: Needs assistance Equipment used: Rolling walker (2 wheeled) Transfers: Sit to/from Stand Sit to Stand: Min guard          General transfer comment: Increased ability to complete appropriately, remains self limiting during sessions    Balance Overall balance assessment: Needs assistance Sitting-balance support: Feet supported;No upper extremity supported Sitting balance-Leahy Scale: Fair     Standing balance support: Bilateral upper extremity supported Standing balance-Leahy Scale: Poor Standing balance comment: walker and min guard for static standing                           ADL either performed or assessed with clinical judgement   ADL Overall ADL's : Needs assistance/impaired       Grooming Details (indicate cue type and reason): declined washing his face or completing oral care                             Functional mobility during ADLs: Min guard;Rolling walker General ADL Comments: Pt continues to present with decreased activity tolerance impacting his safety and performance of ADLs     Vision       Perception     Praxis      Cognition Arousal/Alertness: Awake/alert Behavior During Therapy: Flat affect Overall Cognitive Status: No family/caregiver present to determine baseline cognitive functioning                                 General Comments: difficult to assess, minimally verbal, follows 1 step commands        Exercises General Exercises - Lower Extremity Quad Sets: AROM;Both;10 reps;Seated Long Arc Quad: AROM;Both;Seated;10 reps Straight Leg Raises: AROM;Both;10  reps;Seated Hip Flexion/Marching: AROM;Both;Seated;10 reps Other Exercises Other Exercises: marching in place and stepping forward and backwards with RW as pt declined further ambulation   Shoulder Instructions       General Comments      Pertinent Vitals/ Pain       Pain Assessment: No/denies pain  Home Living                                          Prior Functioning/Environment              Frequency  Min 2X/week        Progress  Toward Goals  OT Goals(current goals can now be found in the care plan section)  Progress towards OT goals: Progressing toward goals  Acute Rehab OT Goals Patient Stated Goal: None stated OT Goal Formulation: With patient Time For Goal Achievement: 07/12/20 Potential to Achieve Goals: Good  Plan Discharge plan remains appropriate    Co-evaluation                 AM-PAC OT "6 Clicks" Daily Activity     Outcome Measure   Help from another person eating meals?: A Little Help from another person taking care of personal grooming?: A Little Help from another person toileting, which includes using toliet, bedpan, or urinal?: A Lot Help from another person bathing (including washing, rinsing, drying)?: A Lot Help from another person to put on and taking off regular upper body clothing?: A Little Help from another person to put on and taking off regular lower body clothing?: A Lot 6 Click Score: 15    End of Session Equipment Utilized During Treatment: Rolling walker;Gait belt  OT Visit Diagnosis: Unsteadiness on feet (R26.81);Other abnormalities of gait and mobility (R26.89);Muscle weakness (generalized) (M62.81);Other symptoms and signs involving cognitive function;Feeding difficulties (R63.3) Pain - Right/Left: Right   Activity Tolerance Patient limited by fatigue   Patient Left in chair;with call bell/phone within reach;with chair alarm set   Nurse Communication Mobility status        Time: 5176-1607 OT Time Calculation (min): 17 min  Charges: OT General Charges $OT Visit: 1 Visit OT Treatments $Self Care/Home Management : 8-22 mins  Corinne Ports E. Hava Massingale, COTA/L Acute Rehabilitation Services [phone] [place] 07/08/2020, 10:34 AM

## 2020-07-08 NOTE — Progress Notes (Signed)
PROGRESS NOTE    Evan Chavez  VHQ:[id number] DOB: 08/12/1961 DOA: 05/10/2020 PCP: Rosita Fire, MD   Brief Narrative:  59 year old with history of Barrett's esophagus, peptic ulcer disease with hiatal hernia, duodenal ulcer, BPH, remote substance abuse admitted on 7/29 with recurrent pneumonia, parapneumonic effusion.  Underwent thoracentesis on 8/2 complicated by pneumothorax.  Repeat CT chest on 8/4 showed esophageal perforation with large right-sided hydropneumothorax.  Transferred from [facility] to [facility] on 8/4.  Chest tube placement, VATS with decortication, PEG tube placement and esophageal stent placement.  He also had a esophageal stent placed with follow-up esophagram did not reveal any leak.  During hospitalization also developed new onset atrial fibrillation.  Due to recurrent pneumonia he went into sepsis requiring another round of antibiotics.  Patient was placed on triple regimen meropenem, fluconazole and vancomycin per ID recommendations.  Seen by speech and swallow but has been uncooperative.  Declined all p.o. medications.  CT surgery removed esophageal stent on 9/21.  Esophagram did not show any leak.  Maintain chest tube in place to suction   Assessment & Plan:   Principal Problem:   Pleural empyema (HCC) Active Problems:   Essential hypertension   Atrial tachycardia (HCC)   Dyslipidemia   Transaminitis   Gastroesophageal reflux disease with esophagitis   Barrett's esophagus   Thyroid nodule   BPH (benign prostatic hyperplasia)   Ureteral stone with hydronephrosis   Pneumothorax on right   Acute respiratory failure with hypoxia (HCC)   Malnutrition of moderate degree   Esophageal perforation   Pressure injury of skin   Atrial fibrillation with RVR (HCC)   Right lower lobe pulmonary infiltrate   Dysphagia   Status post thoracentesis   Chronic diastolic CHF (congestive heart failure) (HCC)  Sepsis secondary to community-acquired pneumonia  with parapneumonic effusion complicated by hydropneumothorax, recurrent empyema Acute respiratory distress with hypoxia secondary to pneumonia -Sepsis physiology has improved. -Currently chest tube is in place.  Diminishing output, suspect some blockage due to thick secretions.  Have notified CT surgery team to help address this. -Chest x-ray 9/26-overall clear, final read pending -Infectious disease recommending antibiotics-vancomycin, meropenem and fluconazole until 9/26  Esophageal perforation -Currently resolved.  Stent removed on 9/21.  Follow-up esophagram negative for any leakage  Oropharyngeal dysphagia -Status post PEG tube placement.  Paroxysmal atrial fibrillation -Echocardiogram shows preserved EF. -Currently on Eliquis and metoprolol  Chronic diastolic congestive heart failure, euvolemic -Supportive care  History of CVA -On statin  Moderate protein calorie malnutrition -Currently on PEG tube feeding.  Left-sided hydronephrosis with ureteral stone -Urology recommending outpatient follow-up for lithotripsy  Depression -Prozac   DVT prophylaxis: Eliquis Code Status: Full code Family Communication:    Status is: Inpatient  Remains inpatient appropriate because:Inpatient level of care appropriate due to severity of illness   Dispo:  Patient From:    Planned Disposition: [place]  Expected discharge date: 07/16/20  Medically stable for discharge:  Currently chest tube in place until cleared by CT surgery    Body mass index is 19.23 kg/m.    Subjective: Patient sitting at the bedside, no complaints at this time.  Denies any shortness of breath.  Review of Systems Otherwise negative except as per HPI, including: General: Denies fever, chills, night sweats or unintended weight loss. Resp: Denies cough, wheezing, shortness of breath. Cardiac: Denies chest pain, palpitations, orthopnea, paroxysmal nocturnal dyspnea. GI: Denies abdominal  pain, nausea, vomiting, diarrhea or constipation GU: Denies dysuria, frequency, hesitancy or incontinence  MS: Denies muscle aches, joint pain or swelling Neuro: Denies headache, neurologic deficits (focal weakness, numbness, tingling), abnormal gait Psych: Denies anxiety, depression, SI/HI/AVH Skin: Denies new rashes or lesions ID: Denies sick contacts, exotic exposures, travel  Examination:  General exam: Appears calm and comfortable  Respiratory system: Clear to auscultation. Respiratory effort normal. Cardiovascular system: S1 & S2 heard, RRR. No JVD, murmurs, rubs, gallops or clicks. No pedal edema. Gastrointestinal system: Abdomen is nondistended, soft and nontender. No organomegaly or masses felt. Normal bowel sounds heard. Central nervous system: Alert and oriented. No focal neurological deficits. Extremities: Symmetric 5 x 5 power. Skin: No rashes, lesions or ulcers Psychiatry: Judgement and insight appear normal. Mood & affect appropriate.   PEG tube in place Right-sided chest tube in place  Objective: Vitals:   07/07/20 2010 07/07/20 2200 07/07/20 2345 07/08/20 0308  BP: 119/77  125/85 118/84  Pulse: 66 72 71 72  Resp: 20  18 20   Temp: 98.3 F (36.8 C)  98.6 F (37 C) 98 F (36.7 C)  TempSrc: Oral  Oral Oral  SpO2: 95%  97% 95%  Weight:    59.1 kg  Height:        Intake/Output Summary (Last 24 hours) at 07/08/2020 0751 Last data filed at 07/08/2020 0700 Gross per 24 hour  Intake 2776.11 ml  Output 1200 ml  Net 1576.11 ml   Filed Weights   07/06/20 0304 07/07/20 0413 07/08/20 0308  Weight: 60.1 kg 59.9 kg 59.1 kg     Data Reviewed:   CBC: Recent Labs  Lab 07/02/20 0054 07/02/20 0054 07/03/20 0133 07/04/20 0149 07/05/20 0155 07/06/20 0131 07/08/20 0124  WBC 6.6   < > 6.0 5.7 4.9 5.8 6.1  NEUTROABS 3.4  --  3.0 3.2 2.6  --   --   HGB 8.9*   < > 9.2* 8.4* 8.5* 8.9* 9.8*  HCT 30.6*   < > 31.6* 28.8* 29.7* 31.7* 35.0*  MCV 80.3   < > 80.2 80.4  79.6* 80.3 79.9*  PLT 548*   < > 558* 470* 457* 487* 421*   < > = values in this interval not displayed.   Basic Metabolic Panel: Recent Labs  Lab 07/02/20 0054 07/03/20 0133 07/04/20 0149 07/05/20 0155 07/08/20 0124  NA 134* 134* 135 137 137  K 4.0 4.1 4.0 4.1 4.4  CL 99 99 101 102 102  CO2 27 25 27 27 27   GLUCOSE 118* 100* 128* 126* 104*  BUN 13 15 14 12 14   CREATININE 0.59* 0.66 0.69 0.62 0.72  CALCIUM 8.9 8.8* 8.6* 8.7* 9.3  MG  --   --   --   --  1.9  PHOS  --   --   --   --  3.2   GFR: Estimated Creatinine Clearance: 82.1 mL/min (by C-G formula based on SCr of 0.72 mg/dL). Liver Function Tests: Recent Labs  Lab 07/02/20 0054  AST 29  ALT 80*  ALKPHOS 110  BILITOT 0.3  PROT 7.4  ALBUMIN 2.2*   No results for input(s): LIPASE, AMYLASE in the last 168 hours. No results for input(s): AMMONIA in the last 168 hours. Coagulation Profile: No results for input(s): INR, PROTIME in the last 168 hours. Cardiac Enzymes: No results for input(s): CKTOTAL, CKMB, CKMBINDEX, TROPONINI in the last 168 hours. BNP (last 3 results) No results for input(s): PROBNP in the last 8760 hours. HbA1C: No results for input(s): HGBA1C in the last 72 hours. CBG: Recent Labs  Lab  07/07/20 0623 07/07/20 1133 07/07/20 1615 07/07/20 2121 07/08/20 0603  GLUCAP 129* 133* 114* 97 135*   Lipid Profile: No results for input(s): CHOL, HDL, LDLCALC, TRIG, CHOLHDL, LDLDIRECT in the last 72 hours. Thyroid Function Tests: No results for input(s): TSH, T4TOTAL, FREET4, T3FREE, THYROIDAB in the last 72 hours. Anemia Panel: No results for input(s): VITAMINB12, FOLATE, FERRITIN, TIBC, IRON, RETICCTPCT in the last 72 hours. Sepsis Labs: Recent Labs  Lab 07/02/20 0054  PROCALCITON <0.10    No results found for this or any previous visit (from the past 240 hour(s)).       Radiology Studies: No results found.      Scheduled Meds: . apixaban  5 mg Per Tube BID  . atorvastatin  40 mg  Per Tube Daily  . feeding supplement (PROSource TF)  45 mL Per Tube BID  . FLUoxetine  20 mg Per Tube Daily  . insulin aspart  0-24 Units Subcutaneous TID AC & HS  . mouth rinse  15 mL Mouth Rinse q12n4p  . metoprolol tartrate  100 mg Per Tube BID  . nystatin  5 mL Mouth/Throat QID  . pantoprazole sodium  40 mg Per Tube BID  . sennosides  5 mL Per Tube QHS  . traZODone  50 mg Per Tube QHS   Continuous Infusions: . sodium chloride 1,000 mL (06/06/20 0028)  . sodium chloride    . feeding supplement (OSMOLITE 1.5 CAL) 60 mL/hr at 07/08/20 0400  . fluconazole (DIFLUCAN) IV Stopped (07/07/20 1447)  . meropenem (MERREM) IV Stopped (07/08/20 0009)  . vancomycin Stopped (07/08/20 0600)     LOS: 58 days   Time spent= 35 mins    Izabelle Daus Arsenio Loader, MD [facility]  If 7PM-7AM, please contact night-coverage  07/08/2020, 7:51 AM

## 2020-07-08 NOTE — Plan of Care (Signed)

## 2020-07-09 ENCOUNTER — Inpatient Hospital Stay (HOSPITAL_COMMUNITY): Payer: Medicaid Other

## 2020-07-09 LAB — COMPREHENSIVE METABOLIC PANEL
ALT: 36 U/L (ref 0–44)
AST: 21 U/L (ref 15–41)
Albumin: 2.7 g/dL — ABNORMAL LOW (ref 3.5–5.0)
Alkaline Phosphatase: 101 U/L (ref 38–126)
Anion gap: 5 (ref 5–15)
BUN: 14 mg/dL (ref 6–20)
CO2: 28 mmol/L (ref 22–32)
Calcium: 9.3 mg/dL (ref 8.9–10.3)
Chloride: 102 mmol/L (ref 98–111)
Creatinine, Ser: 0.72 mg/dL (ref 0.61–1.24)
GFR calc Af Amer: >60 mL/min (ref >60)
GFR calc non Af Amer: >60 mL/min (ref >60)
Glucose, Bld: 131 mg/dL — ABNORMAL HIGH (ref 70–99)
Potassium: 4.4 mmol/L (ref 3.5–5.1)
Sodium: 135 mmol/L (ref 135–145)
Total Bilirubin: 0.5 mg/dL (ref 0.3–1.2)
Total Protein: 7.2 g/dL (ref 6.5–8.1)

## 2020-07-09 LAB — GLUCOSE, CAPILLARY
Glucose-Capillary: 129 mg/dL — ABNORMAL HIGH (ref 70–99)
Glucose-Capillary: 133 mg/dL — ABNORMAL HIGH (ref 70–99)
Glucose-Capillary: 118 mg/dL — ABNORMAL HIGH (ref 70–99)
Glucose-Capillary: 120 mg/dL — ABNORMAL HIGH (ref 70–99)

## 2020-07-09 LAB — MAGNESIUM: Magnesium: 1.9 mg/dL (ref 1.7–2.4)

## 2020-07-09 NOTE — Progress Notes (Signed)
Notified IR that chest tube order placed to be pulled.Marland Kitchen PA will be notified to review order and see what will need to be done.

## 2020-07-09 NOTE — Progress Notes (Signed)
      [address]       [place] [address]             [phone]        Patient's chest tube discussed with Dr. Kipp Brood this morning. Since there is no drainage and it appears chest tube has been clogged for the last few days, it is okay to pull the chest tube. Order placed today.    Nicholes Rough, PA-C

## 2020-07-09 NOTE — Progress Notes (Signed)
Referring Physician(s): Dr. Kipp Brood  Supervising Physician: Ruthann Cancer  Patient Status:  [facility] - In-pt  Chief Complaint:  History of esophageal perforation complicated by development of left empyema s/p right posterior chest tube placement in IR 06/25/2020.  Subjective: Patient in bed. He nods his head yes/no appropriately but does not engage in conversation.  Chest tube placement is on the right, very posterior.  Order placed for removal today per TCTS.  PA called to beside for removal.   Allergies: Patient has no known allergies.  Medications: Prior to Admission medications   Medication Sig Start Date End Date Taking? Authorizing Provider  acetaminophen (TYLENOL) 500 MG tablet Take 1,000 mg by mouth every 6 (six) hours as needed (for pain).   Yes [provider]  amantadine (SYMMETREL) 100 MG capsule Take 100 mg by mouth 2 (two) times daily.    Yes [provider]  amoxicillin (AMOXIL) 500 MG capsule Take 2 capsules (1,000 mg total) by mouth 3 (three) times daily. 05/09/20  Yes Fransico Meadow, PA-C  aspirin 325 MG tablet Take 1 tablet (325 mg total) by mouth daily. 07/16/18  Yes Barton Dubois, MD  atorvastatin (LIPITOR) 40 MG tablet Take 40 mg by mouth daily.   Yes [provider]  benzonatate (TESSALON) 100 MG capsule Take 100 mg by mouth 3 (three) times daily. 05/02/20  Yes [provider]  diltiazem (CARDIZEM CD) 120 MG 24 hr capsule Take 1 capsule (120 mg total) by mouth daily. Patient taking differently: Take 120 mg by mouth daily after breakfast.  09/22/18  Yes Soyla Dryer, PA-C  doxycycline (VIBRA-TABS) 100 MG tablet Take 1 tablet (100 mg total) by mouth 2 (two) times daily. 05/09/20  Yes Fransico Meadow, PA-C  Ferrous Sulfate (IRON PO) Take 65 mg by mouth 2 (two) times daily with a meal.   Yes [provider]  loratadine (CLARITIN) 10 MG tablet Take 10 mg by mouth daily. 05/02/20  Yes [provider]  Multiple  Vitamin (MULTIVITAMIN WITH MINERALS) TABS tablet Take 1 tablet by mouth daily after breakfast.    Yes [provider]  pantoprazole (PROTONIX) 40 MG tablet Take 1 tablet (40 mg total) by mouth 2 (two) times daily before a meal. 09/22/18  Yes Soyla Dryer, PA-C  sulfamethoxazole-trimethoprim (BACTRIM DS) 800-160 MG tablet Take 1 tablet by mouth 2 (two) times daily. 5 day course prescribed on 05/04/2020 05/04/20  Yes [provider]  tamsulosin (FLOMAX) 0.4 MG CAPS capsule Take 0.4 mg by mouth daily. 05/04/20  Yes [provider]  apixaban (ELIQUIS) 5 MG TABS tablet Take 1 tablet (5 mg total) by mouth 2 (two) times daily. 06/22/20   Hosie Poisson, MD  diltiazem (CARDIZEM) 120 MG tablet Take 120 mg by mouth daily. Patient not taking: Reported on 05/11/2020 05/02/20   [provider]  FLUoxetine (PROZAC) 20 MG capsule Place 1 capsule (20 mg total) into feeding tube daily. 06/22/20   Hosie Poisson, MD  insulin aspart (NOVOLOG) 100 UNIT/ML injection Inject 0-24 Units into the skin 4 (four) times daily -  before meals and at bedtime. 06/22/20   Hosie Poisson, MD  ipratropium-albuterol (DUONEB) 0.5-2.5 (3) MG/3ML SOLN Take 3 mLs by nebulization every 4 (four) hours as needed. 06/22/20   Hosie Poisson, MD  metoprolol tartrate (LOPRESSOR) 25 mg/10 mL SUSP Place 40 mLs (100 mg total) into feeding tube 2 (two) times daily. 06/22/20   Hosie Poisson, MD  Nutritional Supplements (FEEDING SUPPLEMENT, OSMOLITE 1.5 CAL,) LIQD  Place 1,000 mLs into feeding tube continuous. 06/22/20   Hosie Poisson, MD  Nutritional Supplements (FEEDING SUPPLEMENT, PROSOURCE TF,) liquid Place 45 mLs into feeding tube 2 (two) times daily. 06/22/20   Hosie Poisson, MD  sennosides (SENOKOT) 8.8 MG/5ML syrup Place 5 mLs into feeding tube at bedtime. 06/22/20   Hosie Poisson, MD  traZODone (DESYREL) 50 MG tablet Place 1 tablet (50 mg total) into feeding tube at bedtime. 06/22/20   Hosie Poisson, MD     Vital  Signs: BP 117/73 (BP Location: Left Arm)   Pulse 78   Temp 98.6 F (37 C) (Oral)   Resp 19   Ht 5' 9.02" (1.753 m)   Wt 132 lb 7.9 oz (60.1 kg)   SpO2 98%   BMI 19.56 kg/m   Physical Exam Constitutional:      General: He is not in acute distress. Pulmonary:     Comments: Very medial chest tube-- posterior right chest tube in place.  Stitch has been cut but not removed.  Tube intact.  Small kink found in tubing.  Skin:    General: Skin is warm and dry.  Neurological:     Mental Status: He is alert.     Comments: Nods his head appropriately.      Imaging: DG Fluoro Rm 1-60 Min  Result Date: 07/04/2020 CLINICAL DATA:  Esophageal anastomotic leak. EXAM: DG C-ARM 1-60 MIN CONTRAST:  Patient did not swallow contrast. FLUOROSCOPY TIME:  Radiation Exposure Index (if provided by the fluoroscopic device): 4.2 mGy. COMPARISON:  July 02, 2020. FINDINGS: An attempt was made to perform an esophagram. However, the patient declined to drink contrast despite repeated attempts to convince him to do so. The procedure was discontinued and the patient was returned to the floor. IMPRESSION: Unsuccessful attempt to perform esophagram as patient declined to drink contrast. Electronically Signed   By: Marijo Conception M.D.   On: 07/04/2020 10:12   HYBRID OR IMAGING ([facility] ONLY)  Result Date: 07/03/2020 There is no interpretation for this exam.  This order is for images obtained during a surgical procedure.  Please See "Surgeries" Tab for more information regarding the procedure.    Labs:  CBC: Recent Labs    07/03/20 0133 07/04/20 0149 07/05/20 0155 07/06/20 0131  WBC 6.0 5.7 4.9 5.8  HGB 9.2* 8.4* 8.5* 8.9*  HCT 31.6* 28.8* 29.7* 31.7*  PLT 558* 470* 457* 487*    COAGS: Recent Labs    05/10/20 2111 05/11/20 0536 06/02/20 1240 06/02/20 1524  INR 1.1 1.1 1.2 1.2  APTT 30 31  --  35    BMP: Recent Labs    07/02/20 0054 07/03/20 0133 07/04/20 0149 07/05/20 0155  NA 134* 134*  135 137  K 4.0 4.1 4.0 4.1  CL 99 99 101 102  CO2 27 25 27 27   GLUCOSE 118* 100* 128* 126*  BUN 13 15 14 12   CALCIUM 8.9 8.8* 8.6* 8.7*  CREATININE 0.59* 0.66 0.69 0.62  GFRNONAA >60 >60 >60 >60  GFRAA >60 >60 >60 >60    LIVER FUNCTION TESTS: Recent Labs    06/20/20 0801 06/23/20 0700 06/27/20 0031 07/02/20 0054  BILITOT 0.4 0.4 0.3 0.3  AST 61* 45* 38 29  ALT 198* 158* 124* 80*  ALKPHOS 145* 144* 140* 110  PROT 7.4 7.3 7.3 7.4  ALBUMIN 2.2* 2.0* 1.9* 2.2*    Assessment and Plan: History of esophageal perforation complicated by development of right empyema s/p right chest tube placement in  IR 06/25/2020. Right chest tube stable.  Tubing with small, minimal kink, but otherwise in good position with improvement noted on CXRs. TCTS order for removal.  PA paged to bedside to remove tube.  Chest tube removed in its entirety without complication.  Patient tolerated well.  Ensured comfort and positioned well in bed prior to leaving room.  Again, patient non-verbal but appears able to express his needs with nods and gestures.  IR to follow.  Electronically Signed: Soyla Dryer, AGACNP-BC [phone] 07/09/2020, 4:00 PM   I spent a total of 15 Minutes at the the patient's bedside AND on the patient's hospital floor or unit, greater than 50% of which was counseling/coordinating care for right empyema.

## 2020-07-09 NOTE — Progress Notes (Signed)
PROGRESS NOTE    Evan Chavez  HAL:[id number] DOB: 06/29/1961 DOA: 05/10/2020 PCP: Rosita Fire, MD   Brief Narrative:  59 year old with history of Barrett's esophagus, peptic ulcer disease with hiatal hernia, duodenal ulcer, BPH, remote substance abuse admitted on 7/29 with recurrent pneumonia, parapneumonic effusion.  Underwent thoracentesis on 8/2 complicated by pneumothorax.  Repeat CT chest on 8/4 showed esophageal perforation with large right-sided hydropneumothorax.  Transferred from [facility] to [place] on 8/4.  Chest tube placement, VATS with decortication, PEG tube placement and esophageal stent placement.  He also had a esophageal stent placed with follow-up esophagram did not reveal any leak.  During hospitalization also developed new onset atrial fibrillation.  Due to recurrent pneumonia he went into sepsis requiring another round of antibiotics.  Patient was placed on triple regimen meropenem, fluconazole and vancomycin per ID recommendations.  Seen by speech and swallow but has been uncooperative.  Declined all p.o. medications.  CT surgery removed esophageal stent on 9/21.  Esophagram did not show any leak.  Maintain chest tube in place to suction until cleared by CT surgery.   Assessment & Plan:   Principal Problem:   Pleural empyema (HCC) Active Problems:   Essential hypertension   Atrial tachycardia (HCC)   Dyslipidemia   Transaminitis   Gastroesophageal reflux disease with esophagitis   Barrett's esophagus   Thyroid nodule   BPH (benign prostatic hyperplasia)   Ureteral stone with hydronephrosis   Pneumothorax on right   Acute respiratory failure with hypoxia (HCC)   Malnutrition of moderate degree   Esophageal perforation   Pressure injury of skin   Atrial fibrillation with RVR (HCC)   Right lower lobe pulmonary infiltrate   Dysphagia   Status post thoracentesis   Chronic diastolic CHF (congestive heart failure) (HCC)  Sepsis secondary to  community-acquired pneumonia with parapneumonic effusion complicated by hydropneumothorax, recurrent empyema Acute respiratory distress with hypoxia secondary to pneumonia -Sepsis physiology has improved. -Chest tube in place, management per CT surgery. Because it is clogged , planning on removing it today. -Chest x-ray 9/26-overall clear, final read pending -Infectious disease recommending antibiotics-vancomycin, meropenem and fluconazole until 9/26  Esophageal perforation -Currently resolved.  Stent removed on 9/21.  Follow-up esophagram negative for any leakage  Oropharyngeal dysphagia -Status post PEG tube placement.  Blood glucose in acceptable range  Paroxysmal atrial fibrillation -Echocardiogram shows preserved EF. -Currently on Eliquis and metoprolol  Chronic diastolic congestive heart failure, euvolemic -Supportive care  History of CVA -On statin  Moderate protein calorie malnutrition -Currently on PEG tube feeding.  Left-sided hydronephrosis with ureteral stone -Urology recommending outpatient follow-up for lithotripsy  Depression -Prozac   DVT prophylaxis: Eliquis Code Status: Full code Family Communication:    Status is: Inpatient  Remains inpatient appropriate because:Inpatient level of care appropriate due to severity of illness   Dispo:  Patient From:  Home  Planned Disposition:  SNF  Expected discharge date: 07/16/20  Medically stable for discharge:  Chest tube in place.  Maintain hospital stay until cleared by CT surgery  Body mass index is 19.3 kg/m.    Subjective: Laying in his bed, no complaints. Denies any shortness of breath.  Review of Systems Otherwise negative except as per HPI, including: General: Denies fever, chills, night sweats or unintended weight loss. Resp: Denies cough, wheezing, shortness of breath. Cardiac: Denies chest pain, palpitations, orthopnea, paroxysmal nocturnal dyspnea. GI: Denies abdominal pain, nausea,  vomiting, diarrhea or constipation GU: Denies dysuria, frequency, hesitancy or incontinence MS: Denies  muscle aches, joint pain or swelling Neuro: Denies headache, neurologic deficits (focal weakness, numbness, tingling), abnormal gait Psych: Denies anxiety, depression, SI/HI/AVH Skin: Denies new rashes or lesions ID: Denies sick contacts, exotic exposures, travel  Examination: Constitutional: Not in acute distress, does not speak much. Appears chronically ill Respiratory: Clear to auscultation bilaterally Cardiovascular: Normal sinus rhythm, no rubs Abdomen: Nontender nondistended good bowel sounds Musculoskeletal: No edema noted Skin: No rashes seen Neurologic: CN 2-12 grossly intact.  And nonfocal Psychiatric: Normal judgment and insight. Alert and oriented x 3. Normal mood.  PEG tube in place Right-sided chest tube in place  Objective: Vitals:   07/08/20 2351 07/09/20 0318 07/09/20 0500 07/09/20 0720  BP: 125/85 121/83  117/83  Pulse: 70 69  77  Resp: 20 18  18   Temp: 98.5 F (36.9 C) 98.9 F (37.2 C)  98.6 F (37 C)  TempSrc: Oral Oral  Oral  SpO2: 96% 93%  96%  Weight:   59.3 kg   Height:        Intake/Output Summary (Last 24 hours) at 07/09/2020 0728 Last data filed at 07/09/2020 0400 Gross per 24 hour  Intake 2050 ml  Output 1300 ml  Net 750 ml   Filed Weights   07/07/20 0413 07/08/20 0308 07/09/20 0500  Weight: 59.9 kg 59.1 kg 59.3 kg     Data Reviewed:   CBC: Recent Labs  Lab 07/03/20 0133 07/04/20 0149 07/05/20 0155 07/06/20 0131 07/08/20 0124  WBC 6.0 5.7 4.9 5.8 6.1  NEUTROABS 3.0 3.2 2.6  --   --   HGB 9.2* 8.4* 8.5* 8.9* 9.8*  HCT 31.6* 28.8* 29.7* 31.7* 35.0*  MCV 80.2 80.4 79.6* 80.3 79.9*  PLT 558* 470* 457* 487* 263*   Basic Metabolic Panel: Recent Labs  Lab 07/03/20 0133 07/04/20 0149 07/05/20 0155 07/08/20 0124  NA 134* 135 137 137  K 4.1 4.0 4.1 4.4  CL 99 101 102 102  CO2 25 27 27 27   GLUCOSE 100* 128* 126* 104*  BUN  15 14 12 14   CREATININE 0.66 0.69 0.62 0.72  CALCIUM 8.8* 8.6* 8.7* 9.3  MG  --   --   --  1.9  PHOS  --   --   --  3.2   GFR: Estimated Creatinine Clearance: 82.4 mL/min (by C-G formula based on SCr of 0.72 mg/dL). Liver Function Tests: No results for input(s): AST, ALT, ALKPHOS, BILITOT, PROT, ALBUMIN in the last 168 hours. No results for input(s): LIPASE, AMYLASE in the last 168 hours. No results for input(s): AMMONIA in the last 168 hours. Coagulation Profile: No results for input(s): INR, PROTIME in the last 168 hours. Cardiac Enzymes: No results for input(s): CKTOTAL, CKMB, CKMBINDEX, TROPONINI in the last 168 hours. BNP (last 3 results) No results for input(s): PROBNP in the last 8760 hours. HbA1C: No results for input(s): HGBA1C in the last 72 hours. CBG: Recent Labs  Lab 07/08/20 0603 07/08/20 1058 07/08/20 1604 07/08/20 2100 07/09/20 0623  GLUCAP 135* 118* 112* 117* 129*   Lipid Profile: No results for input(s): CHOL, HDL, LDLCALC, TRIG, CHOLHDL, LDLDIRECT in the last 72 hours. Thyroid Function Tests: No results for input(s): TSH, T4TOTAL, FREET4, T3FREE, THYROIDAB in the last 72 hours. Anemia Panel: No results for input(s): VITAMINB12, FOLATE, FERRITIN, TIBC, IRON, RETICCTPCT in the last 72 hours. Sepsis Labs: No results for input(s): PROCALCITON, LATICACIDVEN in the last 168 hours.  No results found for this or any previous visit (from the past 240 hour(s)).  Radiology Studies: DG Chest Port 1 View  Result Date: 07/08/2020 CLINICAL DATA:  Shortness of breath. EXAM: PORTABLE CHEST 1 VIEW COMPARISON:  Chest radiograph June 27, 2020 FINDINGS: Stable cardiac and mediastinal contours. Esophageal stent no longer visualized. Slight interval improvement in right mid lower lung airspace opacities. Right pleural drain remains coiled projecting over the right lower hemithorax. No large pleural effusion or pneumothorax. IMPRESSION: 1. Right pleural drain  remains coiled projecting over the right lower hemithorax. 2. Slight interval improvement in right mid and lower lung airspace opacities. Electronically Signed   By: Lovey Newcomer M.D.   On: 07/08/2020 14:13        Scheduled Meds: . apixaban  5 mg Per Tube BID  . atorvastatin  40 mg Per Tube Daily  . feeding supplement (PROSource TF)  45 mL Per Tube BID  . FLUoxetine  20 mg Per Tube Daily  . insulin aspart  0-24 Units Subcutaneous TID AC & HS  . mouth rinse  15 mL Mouth Rinse q12n4p  . metoprolol tartrate  100 mg Per Tube BID  . nystatin  5 mL Mouth/Throat QID  . pantoprazole sodium  40 mg Per Tube BID  . sennosides  5 mL Per Tube QHS  . traZODone  50 mg Per Tube QHS   Continuous Infusions: . sodium chloride 1,000 mL (06/06/20 0028)  . sodium chloride    . feeding supplement (OSMOLITE 1.5 CAL) 60 mL/hr at 07/09/20 0000     LOS: 59 days   Time spent= 35 mins    Burkley Dech Arsenio Loader, MD [facility]  If 7PM-7AM, please contact night-coverage  07/09/2020, 7:28 AM

## 2020-07-10 LAB — COMPREHENSIVE METABOLIC PANEL
ALT: 38 U/L (ref 0–44)
AST: 22 U/L (ref 15–41)
Albumin: 2.8 g/dL — ABNORMAL LOW (ref 3.5–5.0)
Alkaline Phosphatase: 101 U/L (ref 38–126)
Anion gap: 9 (ref 5–15)
BUN: 19 mg/dL (ref 6–20)
CO2: 27 mmol/L (ref 22–32)
Calcium: 9.4 mg/dL (ref 8.9–10.3)
Chloride: 100 mmol/L (ref 98–111)
Creatinine, Ser: 0.69 mg/dL (ref 0.61–1.24)
GFR calc Af Amer: >60 mL/min (ref >60)
GFR calc non Af Amer: >60 mL/min (ref >60)
Glucose, Bld: 125 mg/dL — ABNORMAL HIGH (ref 70–99)
Potassium: 4.1 mmol/L (ref 3.5–5.1)
Sodium: 136 mmol/L (ref 135–145)
Total Bilirubin: 0.5 mg/dL (ref 0.3–1.2)
Total Protein: 7.4 g/dL (ref 6.5–8.1)

## 2020-07-10 LAB — GLUCOSE, CAPILLARY
Glucose-Capillary: 122 mg/dL — ABNORMAL HIGH (ref 70–99)
Glucose-Capillary: 123 mg/dL — ABNORMAL HIGH (ref 70–99)
Glucose-Capillary: 124 mg/dL — ABNORMAL HIGH (ref 70–99)
Glucose-Capillary: 115 mg/dL — ABNORMAL HIGH (ref 70–99)

## 2020-07-10 LAB — MAGNESIUM: Magnesium: 2 mg/dL (ref 1.7–2.4)

## 2020-07-10 MED ORDER — OSMOLITE 1.5 CAL PO LIQD
1000.0000 mL | ORAL | Status: DC
  Administered 2020-07-10 – 2020-07-11 (×3): 1000 mL
  Filled 2020-07-10 (×4): qty 1000

## 2020-07-10 NOTE — Progress Notes (Signed)
PROGRESS NOTE    Evan Chavez  TIW:[id number] DOB: 1961-05-31 DOA: 05/10/2020 PCP: Rosita Fire, MD   Brief Narrative:  59 year old with history of Barrett's esophagus, peptic ulcer disease with hiatal hernia, duodenal ulcer, BPH, remote substance abuse admitted on 7/29 with recurrent pneumonia, parapneumonic effusion.  Underwent thoracentesis on 8/2 complicated by pneumothorax.  Repeat CT chest on 8/4 showed esophageal perforation with large right-sided hydropneumothorax.  Transferred from [facility] to [place] on 8/4.  Chest tube placement, VATS with decortication, PEG tube placement and esophageal stent placement.  He also had a esophageal stent placed with follow-up esophagram did not reveal any leak.  During hospitalization also developed new onset atrial fibrillation.  Due to recurrent pneumonia he went into sepsis requiring another round of antibiotics.  Patient was placed on triple regimen meropenem, fluconazole and vancomycin per ID recommendations.  Seen by speech and swallow but has been uncooperative.  Declined all p.o. medications.  CT surgery removed esophageal stent on 9/21.  Esophagram did not show any leak.  Chest tube removed by IR 9/27.  Assessment & Plan:   Principal Problem:   Pleural empyema (HCC) Active Problems:   Essential hypertension   Atrial tachycardia (HCC)   Dyslipidemia   Transaminitis   Gastroesophageal reflux disease with esophagitis   Barrett's esophagus   Thyroid nodule   BPH (benign prostatic hyperplasia)   Ureteral stone with hydronephrosis   Pneumothorax on right   Acute respiratory failure with hypoxia (HCC)   Malnutrition of moderate degree   Esophageal perforation   Pressure injury of skin   Atrial fibrillation with RVR (HCC)   Right lower lobe pulmonary infiltrate   Dysphagia   Status post thoracentesis   Chronic diastolic CHF (congestive heart failure) (HCC)  Sepsis secondary to community-acquired pneumonia with  parapneumonic effusion complicated by hydropneumothorax, recurrent empyema Acute respiratory distress with hypoxia secondary to pneumonia -Sepsis physiology has improved. -Chest tube removed by IR 07/09/2020. -Infectious disease recommending antibiotics-vancomycin, meropenem and fluconazole until 9/26 -Supplemental oxygen as needed  Esophageal perforation -Currently resolved.  Stent removed on 9/21.  Follow-up esophagram negative for any leakage  Oropharyngeal dysphagia -Status post PEG tube placement.  Blood glucose in acceptable range  Paroxysmal atrial fibrillation -Echocardiogram shows preserved EF. -Currently on Eliquis and metoprolol  Chronic diastolic congestive heart failure, euvolemic -Supportive care  History of CVA -On statin  Moderate protein calorie malnutrition -Currently on PEG tube feeding.  Left-sided hydronephrosis with ureteral stone -Urology recommending outpatient follow-up for lithotripsy  Depression -Prozac   DVT prophylaxis: Eliquis Code Status: Full code Family Communication:    Status is: Inpatient  Remains inpatient appropriate because:Inpatient level of care appropriate due to severity of illness   Dispo:  Patient From:  Home  Planned Disposition:  SNF  Expected discharge date: 07/19/20  Medically stable for discharge:  Once cleared by IR and CT surgery, he will require placement.  Body mass index is 19.78 kg/m.    Subjective: Laying in the bed.  Does not participate much in conversation.  Denying any complaints.  Review of Systems Otherwise negative except as per HPI, including: General: Denies fever, chills, night sweats or unintended weight loss. Resp: Denies cough, wheezing, shortness of breath. Cardiac: Denies chest pain, palpitations, orthopnea, paroxysmal nocturnal dyspnea. GI: Denies abdominal pain, nausea, vomiting, diarrhea or constipation GU: Denies dysuria, frequency, hesitancy or incontinence MS: Denies muscle  aches, joint pain or swelling Neuro: Denies headache, neurologic deficits (focal weakness, numbness, tingling), abnormal gait Psych: Denies anxiety,  depression, SI/HI/AVH Skin: Denies new rashes or lesions ID: Denies sick contacts, exotic exposures, travel  Examination: Constitutional: Not in acute distress Respiratory: Clear to auscultation bilaterally Cardiovascular: Normal sinus rhythm, no rubs Abdomen: Nontender nondistended good bowel sounds Musculoskeletal: No edema noted Skin: No rashes seen Neurologic: Difficult to assess but grossly moving all the extremities Psychiatric: Difficult to assess  PEG tube in place   Objective: Vitals:   07/10/20 0420 07/10/20 0500 07/10/20 0743 07/10/20 1045  BP: 138/88  134/84 123/90  Pulse: 85  90 78  Resp: 16  18 20   Temp: 98.3 F (36.8 C)  98.8 F (37.1 C) 98.2 F (36.8 C)  TempSrc: Oral  Oral Oral  SpO2: 95%  96% 96%  Weight:  60.8 kg    Height:        Intake/Output Summary (Last 24 hours) at 07/10/2020 1205 Last data filed at 07/10/2020 0528 Gross per 24 hour  Intake 1080 ml  Output 600 ml  Net 480 ml   Filed Weights   07/08/20 0308 07/09/20 0500 07/10/20 0500  Weight: 59.1 kg 59.3 kg 60.8 kg     Data Reviewed:   CBC: Recent Labs  Lab 07/04/20 0149 07/05/20 0155 07/06/20 0131 07/08/20 0124  WBC 5.7 4.9 5.8 6.1  NEUTROABS 3.2 2.6  --   --   HGB 8.4* 8.5* 8.9* 9.8*  HCT 28.8* 29.7* 31.7* 35.0*  MCV 80.4 79.6* 80.3 79.9*  PLT 470* 457* 487* 427*   Basic Metabolic Panel: Recent Labs  Lab 07/04/20 0149 07/05/20 0155 07/08/20 0124 07/09/20 0709 07/10/20 0208  NA 135 137 137 135 136  K 4.0 4.1 4.4 4.4 4.1  CL 101 102 102 102 100  CO2 27 27 27 28 27   GLUCOSE 128* 126* 104* 131* 125*  BUN 14 12 14 14 19   CREATININE 0.69 0.62 0.72 0.72 0.69  CALCIUM 8.6* 8.7* 9.3 9.3 9.4  MG  --   --  1.9 1.9 2.0  PHOS  --   --  3.2  --   --    GFR: Estimated Creatinine Clearance: 84.4 mL/min (by C-G formula based on  SCr of 0.69 mg/dL). Liver Function Tests: Recent Labs  Lab 07/09/20 0709 07/10/20 0208  AST 21 22  ALT 36 38  ALKPHOS 101 101  BILITOT 0.5 0.5  PROT 7.2 7.4  ALBUMIN 2.7* 2.8*   No results for input(s): LIPASE, AMYLASE in the last 168 hours. No results for input(s): AMMONIA in the last 168 hours. Coagulation Profile: No results for input(s): INR, PROTIME in the last 168 hours. Cardiac Enzymes: No results for input(s): CKTOTAL, CKMB, CKMBINDEX, TROPONINI in the last 168 hours. BNP (last 3 results) No results for input(s): PROBNP in the last 8760 hours. HbA1C: No results for input(s): HGBA1C in the last 72 hours. CBG: Recent Labs  Lab 07/09/20 1054 07/09/20 1604 07/09/20 2134 07/10/20 0626 07/10/20 1045  GLUCAP 133* 118* 120* 122* 123*   Lipid Profile: No results for input(s): CHOL, HDL, LDLCALC, TRIG, CHOLHDL, LDLDIRECT in the last 72 hours. Thyroid Function Tests: No results for input(s): TSH, T4TOTAL, FREET4, T3FREE, THYROIDAB in the last 72 hours. Anemia Panel: No results for input(s): VITAMINB12, FOLATE, FERRITIN, TIBC, IRON, RETICCTPCT in the last 72 hours. Sepsis Labs: No results for input(s): PROCALCITON, LATICACIDVEN in the last 168 hours.  No results found for this or any previous visit (from the past 240 hour(s)).       Radiology Studies: [facility] Chest [facility] 1 View  Result  Date: 07/09/2020 CLINICAL DATA:  Chest tube removal. EXAM: PORTABLE CHEST 1 VIEW COMPARISON:  Radiograph yesterday. FINDINGS: Removal of pigtail catheter previously projecting over the lower right chest. There is no visualized pneumothorax. No radiographic evidence of pleural effusion. There are streaky opacities at the right lung base. The left lung is clear. Stable heart size and mediastinal contours from prior exam. No acute osseous abnormalities seen. IMPRESSION: 1. Removal of pigtail catheter previously projecting over the lower right chest. No visualized pneumothorax. 2. Streaky right  lung base opacities, likely atelectasis. Electronically Signed   By: Keith Rake M.D.   On: 07/09/2020 16:58   DG Chest Port 1 View  Result Date: 07/08/2020 CLINICAL DATA:  Shortness of breath. EXAM: PORTABLE CHEST 1 VIEW COMPARISON:  Chest radiograph June 27, 2020 FINDINGS: Stable cardiac and mediastinal contours. Esophageal stent no longer visualized. Slight interval improvement in right mid lower lung airspace opacities. Right pleural drain remains coiled projecting over the right lower hemithorax. No large pleural effusion or pneumothorax. IMPRESSION: 1. Right pleural drain remains coiled projecting over the right lower hemithorax. 2. Slight interval improvement in right mid and lower lung airspace opacities. Electronically Signed   By: Lovey Newcomer M.D.   On: 07/08/2020 14:13        Scheduled Meds: . apixaban  5 mg Per Tube BID  . atorvastatin  40 mg Per Tube Daily  . feeding supplement (PROSource TF)  45 mL Per Tube BID  . FLUoxetine  20 mg Per Tube Daily  . insulin aspart  0-24 Units Subcutaneous TID AC & HS  . mouth rinse  15 mL Mouth Rinse q12n4p  . metoprolol tartrate  100 mg Per Tube BID  . nystatin  5 mL Mouth/Throat QID  . pantoprazole sodium  40 mg Per Tube BID  . sennosides  5 mL Per Tube QHS  . traZODone  50 mg Per Tube QHS   Continuous Infusions: . sodium chloride 1,000 mL (06/06/20 0028)  . sodium chloride    . feeding supplement (OSMOLITE 1.5 CAL) 1,000 mL (07/10/20 1132)     LOS: 60 days   Time spent= 35 mins    Ahamed Hofland Arsenio Loader, MD [facility]  If 7PM-7AM, please contact night-coverage  07/10/2020, 12:05 PM

## 2020-07-10 NOTE — Progress Notes (Signed)
Physical Therapy Treatment Patient Details Name: Evan Chavez MRN: [id number] DOB: 1960-11-07 Today's Date: 07/10/2020    History of Present Illness Pt is 59 yo male admitted 7/28 with generalized weakness, productive cough and low-grade fever. Pt with hydropneumothorax, Sepsis, aspiration pneumonia and esophagal rupture, AKI and acute delirium. s/p R thoracentesis on 8/2. Chest tube placed 8/2-8/9. s/p VATS, decortication, EGD, PEG tube placement, and a esophageal stent on 8/9. s/p Right thoracoscopy, chest tube placement, esophageal stent placement 8/13-8/20. 9/13- s/p rt chest tube placement. 9/21 pt underwent EGD with esophageal stent removal. 9/27 Rt chest tube removed. PMH of GERD, Barrett's esophagus, PUD with extensive gastric erosion, duodenal ulcer and esophageal stricture, stroke, HLD, BPH, cocaine abuse.    PT Comments    Pt supine on arrival initially resistant to walking but ultimately agreeable with encouragement. Pt with improving gait distance this session and improving balance/posture. Pt remains with very limited communication and lack of verbalization. Pt encouraged to progress mobility with nursing and get OOB daily.     Follow Up Recommendations  SNF     Equipment Recommendations  Rolling walker with 5" wheels    Recommendations for Other Services       Precautions / Restrictions Precautions Precautions: Fall Precaution Comments: PEG, right weakness    Mobility  Bed Mobility Overal bed mobility: Needs Assistance Bed Mobility: Supine to Sit     Supine to sit: Supervision     General bed mobility comments: HOB 15 degrees with cues for initiation and pt able to transition to EOB without physical assist with reliance on pulling on sheet to elevate trunk, increased time to scoot fully to EOB  Transfers Overall transfer level: Needs assistance   Transfers: Sit to/from Stand Sit to Stand: Min guard         General transfer comment: guarding for  safety with cues to back fully to surface  Ambulation/Gait Ambulation/Gait assistance: Min guard Gait Distance (Feet): 150 Feet Assistive device: Rolling walker (2 wheeled) Gait Pattern/deviations: Step-through pattern;Trunk flexed;Decreased stride length   Gait velocity interpretation: 1.31 - 2.62 ft/sec, indicative of limited community ambulator General Gait Details: pt with slow steady gait with improved stride, posture and midline trunk from prior sessions. pt continues with slow steady progression in gait tolerance   Stairs             Wheelchair Mobility    Modified Rankin (Stroke Patients Only)       Balance Overall balance assessment: Needs assistance   Sitting balance-Leahy Scale: Good Sitting balance - Comments: pt sitting EOB grossly 8 min without UE support   Standing balance support: Bilateral upper extremity supported Standing balance-Leahy Scale: Poor Standing balance comment: RW for gait                            Cognition Arousal/Alertness: Awake/alert Behavior During Therapy: Flat affect Overall Cognitive Status: No family/caregiver present to determine baseline cognitive functioning                     Current Attention Level: Sustained   Following Commands: Follows one step commands consistently     Problem Solving: Slow processing General Comments: difficult to assess, minimally verbal will mostly nod or shake his head, follows 1 step commands      Exercises General Exercises - Lower Extremity Long Arc Quad: AROM;Both;Seated;10 reps Hip Flexion/Marching: AROM;Both;Seated;20 reps    General Comments  Pertinent Vitals/Pain Pain Assessment: No/denies pain    Home Living                      Prior Function            PT Goals (current goals can now be found in the care plan section) Progress towards PT goals: Progressing toward goals    Frequency    Min 2X/week      PT Plan Current  plan remains appropriate    Co-evaluation              AM-PAC PT "6 Clicks" Mobility   Outcome Measure  Help needed turning from your back to your side while in a flat bed without using bedrails?: A Little Help needed moving from lying on your back to sitting on the side of a flat bed without using bedrails?: A Little Help needed moving to and from a bed to a chair (including a wheelchair)?: A Little Help needed standing up from a chair using your arms (e.g., wheelchair or bedside chair)?: A Little Help needed to walk in hospital room?: A Little Help needed climbing 3-5 steps with a railing? : A Lot 6 Click Score: 17    End of Session Equipment Utilized During Treatment: Gait belt Activity Tolerance: Patient tolerated treatment well Patient left: in chair;with call bell/phone within reach;with chair alarm set Nurse Communication: Mobility status PT Visit Diagnosis: Unsteadiness on feet (R26.81);Muscle weakness (generalized) (M62.81)     Time: 8828-0034 PT Time Calculation (min) (ACUTE ONLY): 26 min  Charges:  $Gait Training: 8-22 mins $Therapeutic Exercise: 8-22 mins                     Sylvester Salonga P, PT Acute Rehabilitation Services Pager: [phone] Office: [place] 07/10/2020, 1:35 PM

## 2020-07-10 NOTE — Progress Notes (Signed)
Nutrition Follow-up  DOCUMENTATION CODES:   Non-severe (moderate) malnutrition in context of chronic illness  INTERVENTION:   Continue tube feeds via PEG: -Increase Osmolite 1.5to19m/hr(1560 ml/day) - ProSource TF 45 ml BID -Free water flushes per MD  Tube feeding regimenprovides2420kcal,120grams of protein, and11850mof H2O.  NUTRITION DIAGNOSIS:   Moderate Malnutrition related to chronic illness (Barrett's esophagus, GERD) as evidenced by moderate fat depletion, moderate muscle depletion.  Ongoing  GOAL:   Patient will meet greater than or equal to 90% of their needs  Met via TF  MONITOR:   Diet advancement, Labs, Weight trends, TF tolerance, Skin, I & O's  REASON FOR ASSESSMENT:   Consult Enteral/tube feeding initiation and management  ASSESSMENT:   Evan Chavez who presented on 7/28 with fever, SOB. PMH of stroke, HLD, GERD, Barrett's esophagus, PUD, s/p balloon stricturoplasty for GE junction in 2020. Admitted with severe sepsis secondary to bilateral pleural effusions with concomitant bibasilar pneumonia.  8/02 - MBSS with recommendation for regular diet with thin liquids, s/p thoracentesis 8/04 - s/p chest tube insertion, chest CT showing esophageal perforation 8/05 - s/p VATS, esophageal stent placement, PEG tube placement 8/13- s/pright thoracoscopy, drainage of loculated pleural effusion, esophagogastroscopy,esophageal stent placement, esophagram with Omnipaque 8/16 - swallow study showing no leak 8/19 - diet advanced to clear liquids 8/25 - FEES showing no observable oropharyngeal dysphagia (other than oral aversion) with recommendation for thin liquids 9/13 - s/p right chest tube placement (empyema vs pulmonary abscess), nystatin ordered for suspected oral thrush 9/21 - OR for esophogeal stent removal, esophagram showing no leakage 9/27 - chest tube removed  Pt refusing to take POs for swallow studies and therefore NPO.  Discussed  pt with RN. Pt tolerating tube feeds without issue. RD will increase rate to 65 ml/hr given weight loss.  Admit weight: 67.5 kg Current weight: 60.8 kg  Current TF: Osmolite 1.5 @ 60 ml/hr, ProSource TF 45 ml BID  Medications reviewed and include: SSI, nystatin, protonix, senokot  Labs reviewed. CBG's: 118-123 x 24 hours  Diet Order:   Diet Order            Diet NPO time specified  Diet effective now           Diet - low sodium heart healthy                 EDUCATION NEEDS:   Education needs have been addressed  Skin:  Skin Assessment: Reviewed RN Assessment  Last BM:  07/09/20 large type 6  Height:   Ht Readings from Last 1 Encounters:  07/03/20 5' 9.02" (1.753 m)    Weight:   Wt Readings from Last 1 Encounters:  07/10/20 60.8 kg    Ideal Body Weight:  72.7 kg  BMI:  Body mass index is 19.78 kg/m.  Estimated Nutritional Needs:   Kcal:  229249-3241Protein:  110-130 grams  Fluid:  >/= 2.0 L    KaGaynell FaceMS, RD, LDN Inpatient Clinical Dietitian Please see AMiON for contact information.

## 2020-07-11 LAB — GLUCOSE, CAPILLARY
Glucose-Capillary: 137 mg/dL — ABNORMAL HIGH (ref 70–99)
Glucose-Capillary: 108 mg/dL — ABNORMAL HIGH (ref 70–99)
Glucose-Capillary: 113 mg/dL — ABNORMAL HIGH (ref 70–99)
Glucose-Capillary: 105 mg/dL — ABNORMAL HIGH (ref 70–99)

## 2020-07-11 LAB — COMPREHENSIVE METABOLIC PANEL
ALT: 40 U/L (ref 0–44)
AST: 24 U/L (ref 15–41)
Albumin: 2.9 g/dL — ABNORMAL LOW (ref 3.5–5.0)
Alkaline Phosphatase: 104 U/L (ref 38–126)
Anion gap: 11 (ref 5–15)
BUN: 20 mg/dL (ref 6–20)
CO2: 27 mmol/L (ref 22–32)
Calcium: 9.8 mg/dL (ref 8.9–10.3)
Chloride: 100 mmol/L (ref 98–111)
Creatinine, Ser: 0.77 mg/dL (ref 0.61–1.24)
GFR calc Af Amer: >60 mL/min (ref >60)
GFR calc non Af Amer: >60 mL/min (ref >60)
Glucose, Bld: 124 mg/dL — ABNORMAL HIGH (ref 70–99)
Potassium: 4.3 mmol/L (ref 3.5–5.1)
Sodium: 138 mmol/L (ref 135–145)
Total Bilirubin: 0.4 mg/dL (ref 0.3–1.2)
Total Protein: 7.7 g/dL (ref 6.5–8.1)

## 2020-07-11 LAB — MAGNESIUM: Magnesium: 2 mg/dL (ref 1.7–2.4)

## 2020-07-11 LAB — RESP PANEL BY RT PCR (RSV, FLU A&B, COVID)
Influenza A by PCR: NEGATIVE
Influenza B by PCR: NEGATIVE
Respiratory Syncytial Virus by PCR: NEGATIVE
SARS Coronavirus 2 by RT PCR: NEGATIVE

## 2020-07-11 MED ORDER — INSULIN ASPART 100 UNIT/ML ~~LOC~~ SOLN
0.0000 [IU] | Freq: Three times a day (TID) | SUBCUTANEOUS | Status: DC
  Administered 2020-07-12 (×2): 2 [IU] via SUBCUTANEOUS

## 2020-07-11 MED ORDER — ATORVASTATIN CALCIUM 40 MG PO TABS: 40.0000 mg | ORAL_TABLET | Freq: Every day | ORAL | Status: AC

## 2020-07-11 MED ORDER — CERTA-VITE PO LIQD: 5.0000 mL | Freq: Every day | ORAL | 0 refills | Status: AC

## 2020-07-11 MED ORDER — OSMOLITE 1.5 CAL PO LIQD: 1560.0000 mL | ORAL | 0 refills | Status: DC

## 2020-07-11 MED ORDER — ACETAMINOPHEN 160 MG/5ML PO SOLN: 650.0000 mg | Freq: Four times a day (QID) | ORAL | 0 refills | Status: AC | PRN

## 2020-07-11 MED ORDER — INSULIN ASPART 100 UNIT/ML ~~LOC~~ SOLN: 0.0000 [IU] | Freq: Three times a day (TID) | SUBCUTANEOUS | 11 refills | Status: AC

## 2020-07-11 MED ORDER — FERROUS SULFATE 300 (60 FE) MG/5ML PO SYRP: 300.0000 mg | ORAL_SOLUTION | Freq: Two times a day (BID) | ORAL | 3 refills | Status: AC

## 2020-07-11 MED ORDER — APIXABAN 5 MG PO TABS: 5.0000 mg | ORAL_TABLET | Freq: Two times a day (BID) | ORAL | Status: AC

## 2020-07-11 NOTE — NC FL2 (Signed)
[place] MEDICAID FL2 LEVEL OF CARE SCREENING TOOL     IDENTIFICATION  Patient Name: Evan Chavez Birthdate: [birth year] Sex: male Admission Date (Current Location): 05/10/2020  [facility] and [place] Number:  [employer] and Address:  [facility] [place], [place] [address] [place], [place] [address]      Provider Number: [id number]  Attending Physician Name and Address:  Damita Lack, MD  Relative Name and Phone Number:  Peter Congo (mother) [phone]    Current Level of Care: Hospital Recommended Level of Care: [place] Prior Approval Number:    Date Approved/Denied:   PASRR Number: [id number]  Discharge Plan: SNF    Current Diagnoses: Patient Active Problem List   Diagnosis Date Noted  . Chronic diastolic CHF (congestive heart failure) ([place]) 07/02/2020  . Status post thoracentesis   . Dysphagia 06/04/2020  . Right lower lobe pulmonary infiltrate 05/30/2020  . Atrial fibrillation with RVR ([place])   . Pressure injury of skin 05/20/2020  . Malnutrition of moderate degree 05/17/2020  . Esophageal perforation 05/17/2020  . Pneumothorax on right 05/15/2020  . Acute respiratory failure with hypoxia ([place]) 05/15/2020  . Ureteral stone with hydronephrosis 05/13/2020  . Thyroid nodule 05/11/2020  . BPH (benign prostatic hyperplasia) 05/11/2020  . Pleural empyema ([place]) 05/11/2020  . Barrett's esophagus 06/15/2019  . Anemia 03/30/2019  . Gastroesophageal reflux disease with esophagitis   . Transaminitis 08/21/2018  . Atrial tachycardia ([place]) 07/13/2018  . Dyslipidemia 07/13/2018  . History of CVA (cerebrovascular accident) 07/12/2018  . Essential hypertension 07/12/2018  . Tobacco abuse 07/12/2018  . Cocaine abuse ([place]) 07/12/2018    Orientation RESPIRATION BLADDER Height & Weight     Self, Place  Normal Incontinent, External catheter Weight: 127 lb 13.9 oz (58 kg) Height:  5' 9.02" (175.3 cm)  BEHAVIORAL  SYMPTOMS/MOOD NEUROLOGICAL BOWEL NUTRITION STATUS      Incontinent Feeding tube (See DC summary)  AMBULATORY STATUS COMMUNICATION OF NEEDS Skin   Extensive Assist Verbally Surgical wounds (Closed incision)   PU Stage 2 Dressing: BID                   Personal Care Assistance Level of Assistance  Bathing, Feeding, Dressing Bathing Assistance: Maximum assistance Feeding assistance: Limited assistance Dressing Assistance: Maximum assistance     Functional Limitations Info  Speech, Sight, Hearing Sight Info: Adequate Hearing Info: Adequate Speech Info: Adequate    SPECIAL CARE FACTORS FREQUENCY  PT (By licensed PT), OT (By licensed OT)     PT Frequency: 5x a week OT Frequency: 5x a week            Contractures      Additional Factors Info  Psychotropic Code Status Info: FULL Allergies Info: NKA Psychotropic Info: Prozac, trazodone   Isolation Precautions Info: MRSA     Current Medications (07/11/2020):  This is the current hospital active medication list Current Facility-Administered Medications  Medication Dose Route Frequency Provider Last Rate Last Admin  . 0.9 %  sodium chloride infusion   Intravenous PRN Elgie Collard, PA-C 10 mL/hr at 06/06/20 0028 1,000 mL at 06/06/20 0028  . 0.9 %  sodium chloride infusion   Intra-arterial PRN Harriet Pho, Tessa N, PA-C      . acetaminophen (TYLENOL) 160 MG/5ML solution 650 mg  650 mg Per Tube Q6H PRN Danford, Suann Larry, MD      . apixaban (ELIQUIS) tablet 5 mg  5 mg Per Tube BID Danford, Suann Larry, MD  5 mg at 07/11/20 0839  . atorvastatin (LIPITOR) tablet 40 mg  40 mg Per Tube Daily Annita Brod, MD   40 mg at 07/11/20 0839  . feeding supplement (OSMOLITE 1.5 CAL) liquid 1,000 mL  1,000 mL Per Tube Continuous Amin, Ankit Chirag, MD 65 mL/hr at 07/11/20 0400 Rate Verify at 07/11/20 0400  . feeding supplement (PROSource TF) liquid 45 mL  45 mL Per Tube BID Lajuana Matte, MD   45 mL at 07/11/20 0837  .  fentaNYL (SUBLIMAZE) injection 25 mcg  25 mcg Intravenous Q2H PRN Elgie Collard, PA-C   25 mcg at 06/01/20 2132  . FLUoxetine (PROZAC) capsule 20 mg  20 mg Per Tube Daily Mariel Aloe, MD   20 mg at 07/11/20 0839  . guaiFENesin-dextromethorphan (ROBITUSSIN DM) 100-10 MG/5ML syrup 10 mL  10 mL Per Tube Q4H PRN Hosie Poisson, MD      . hydrALAZINE (APRESOLINE) injection 10 mg  10 mg Intravenous Q4H PRN Conte, Tessa N, PA-C      . insulin aspart (novoLOG) injection 0-24 Units  0-24 Units Subcutaneous TID AC & HS Samella Parr, NP      . ipratropium-albuterol (DUONEB) 0.5-2.5 (3) MG/3ML nebulizer solution 3 mL  3 mL Nebulization Q4H PRN Nicholes Rough N, PA-C   3 mL at 05/31/20 0925  . MEDLINE mouth rinse  15 mL Mouth Rinse q12n4p Nicholes Rough N, PA-C   15 mL at 07/11/20 1156  . metoprolol tartrate (LOPRESSOR) 25 mg/10 mL oral suspension 100 mg  100 mg Per Tube BID Hosie Poisson, MD   100 mg at 07/11/20 0838  . metoprolol tartrate (LOPRESSOR) injection 5 mg  5 mg Intravenous Q4H PRN Elgie Collard, PA-C   5 mg at 06/26/20 0949  . nystatin (MYCOSTATIN) 100000 UNIT/ML suspension 500,000 Units  5 mL Mouth/Throat QID Edwin Dada, MD   500,000 Units at 07/11/20 [phone]  . ondansetron (ZOFRAN) injection 4 mg  4 mg Intravenous Q6H PRN Elgie Collard, PA-C   4 mg at 06/22/20 1745  . oxyCODONE (ROXICODONE) 5 MG/5ML solution 5 mg  5 mg Per Tube Q6H PRN Nicholes Rough N, PA-C   5 mg at 06/29/20 2141  . pantoprazole sodium (PROTONIX) 40 mg/20 mL oral suspension 40 mg  40 mg Per Tube BID Georgette Shell, MD   40 mg at 07/11/20 0842  . phenol (CHLORASEPTIC) mouth spray 1 spray  1 spray Mouth/Throat PRN Lajuana Matte, MD   1 spray at 06/21/20 1650  . sennosides (SENOKOT) 8.8 MG/5ML syrup 5 mL  5 mL Per Tube QHS Nicholes Rough N, PA-C   5 mL at 07/10/20 2202  . traMADol (ULTRAM) tablet 25 mg  25 mg Per Tube Q6H PRN Lajuana Matte, MD   25 mg at 06/10/20 1847  . traZODone (DESYREL) tablet 50 mg   50 mg Per Tube QHS Mariel Aloe, MD   50 mg at 07/10/20 2201     Discharge Medications: Please see discharge summary for a list of discharge medications.  Relevant Imaging Results:  Relevant Lab Results:   Additional Information SSN [id number]  Glennon Hamilton, Student-Social Work

## 2020-07-11 NOTE — Plan of Care (Signed)

## 2020-07-11 NOTE — Progress Notes (Signed)
CSW contacted [employer] regarding pre-auth for transportation to SNF (requested by [facility]). CSW was transferred to [facility] transport [phone]). Per Brayton Layman, patient meets their criteria for wheelchair transportation and will not require PTAR auth. She has scheduled pickup at the hospital for tomorrow (9/30) at 2pm. Confirmation # [id number]. They will call the nursing station upon arrival. Unit CSW to cancel transport if SNF auth has not been received yet at that point in time.   Ahleah Simko LCSW

## 2020-07-11 NOTE — Progress Notes (Signed)
Occupational Therapy Treatment Patient Details Name: Evan Chavez MRN: [id number] DOB: 09/03/1961 Today's Date: 07/11/2020    History of present illness Pt is 59 yo male admitted 7/28 with generalized weakness, productive cough and low-grade fever. Pt with hydropneumothorax, Sepsis, aspiration pneumonia and esophagal rupture, AKI and acute delirium. s/p R thoracentesis on 8/2. Chest tube placed 8/2-8/9. s/p VATS, decortication, EGD, PEG tube placement, and a esophageal stent on 8/9. s/p Right thoracoscopy, chest tube placement, esophageal stent placement 8/13-8/20. 9/13- s/p rt chest tube placement. 9/21 pt underwent EGD with esophageal stent removal. 9/27 Rt chest tube removed. PMH of GERD, Barrett's esophagus, PUD with extensive gastric erosion, duodenal ulcer and esophageal stricture, stroke, HLD, BPH, cocaine abuse.   OT comments  Pt immediately willing to ambulate about his room and perform grooming at sink. Pt requiring supervision and management of lines. Demonstrated ability to don his socks at EOB. Agreed to remain up in chair at end of session.   Follow Up Recommendations  SNF;Supervision/Assistance - 24 hour    Equipment Recommendations  Other (comment) (defer to next venue)    Recommendations for Other Services      Precautions / Restrictions Precautions Precautions: Fall Precaution Comments: PEG, right weakness       Mobility Bed Mobility Overal bed mobility: Needs Assistance Bed Mobility: Supine to Sit     Supine to sit: Supervision     General bed mobility comments: supervision for lines  Transfers Overall transfer level: Needs assistance Equipment used: Rolling walker (2 wheeled) Transfers: Sit to/from Stand Sit to Stand: Supervision              Balance Overall balance assessment: Needs assistance Sitting-balance support: Feet supported;No upper extremity supported Sitting balance-Leahy Scale: Good Sitting balance - Comments: no LOB while  donning socks     Standing balance-Leahy Scale: Poor Standing balance comment: RW for gait, fair static standing at sink                           ADL either performed or assessed with clinical judgement   ADL Overall ADL's : Needs assistance/impaired     Grooming: Wash/dry hands;Standing;Supervision/safety               Lower Body Dressing: Set up;Sitting/lateral leans Lower Body Dressing Details (indicate cue type and reason): donned socks             Functional mobility during ADLs: Supervision/safety;Rolling walker General ADL Comments: walked from bed to door and back to recliner, declined ambulation in hall     Vision       Perception     Praxis      Cognition Arousal/Alertness: Awake/alert Behavior During Therapy: Flat affect Overall Cognitive Status: No family/caregiver present to determine baseline cognitive functioning                                 General Comments: difficult to assess, minimally verbal will mostly nod or shake his head, follows 1 step commands        Exercises     Shoulder Instructions       General Comments      Pertinent Vitals/ Pain       Pain Assessment: No/denies pain  Home Living  Prior Functioning/Environment              Frequency  Min 2X/week        Progress Toward Goals  OT Goals(current goals can now be found in the care plan section)  Progress towards OT goals: Progressing toward goals  Acute Rehab OT Goals Patient Stated Goal: None stated OT Goal Formulation: With patient Time For Goal Achievement: 07/25/20 Potential to Achieve Goals: Good  Plan Discharge plan remains appropriate    Co-evaluation                 AM-PAC OT "6 Clicks" Daily Activity     Outcome Measure   Help from another person eating meals?: A Little Help from another person taking care of personal grooming?: A Little Help  from another person toileting, which includes using toliet, bedpan, or urinal?: A Lot Help from another person bathing (including washing, rinsing, drying)?: A Lot Help from another person to put on and taking off regular upper body clothing?: A Little Help from another person to put on and taking off regular lower body clothing?: A Lot 6 Click Score: 15    End of Session Equipment Utilized During Treatment: Rolling walker;Gait belt  OT Visit Diagnosis: Unsteadiness on feet (R26.81);Other abnormalities of gait and mobility (R26.89);Muscle weakness (generalized) (M62.81);Other symptoms and signs involving cognitive function;Feeding difficulties (R63.3)   Activity Tolerance Patient tolerated treatment well   Patient Left in chair;with call bell/phone within reach;with chair alarm set   Nurse Communication Mobility status        Time: 1213-1229 OT Time Calculation (min): 16 min  Charges: OT General Charges $OT Visit: 1 Visit  Nestor Lewandowsky, OTR/L Acute Rehabilitation Services Pager: [phone] Office: [phone]   Malka So 07/11/2020, 12:51 PM

## 2020-07-11 NOTE — Discharge Summary (Signed)
Physician Discharge Summary  Evan Chavez BJS:[id number] DOB: July 08, 1961 DOA: 05/10/2020  PCP: Rosita Fire, MD  Admit date: 05/10/2020 Discharge date: 07/11/2020  Time spent: 45 minutes  Recommendations for Outpatient Follow-up:  1. Presented with sepsis physiology and associated parapneumonic effusion.  This in context of known Barrett's esophagitis and esophageal perforation.  Patient required esophageal stent as well as VATS decortication procedure and chest tube during the hospitalization.  Patient will need to follow-up with CT surgery as. 2. Patient with left hydronephrosis.  Urology/[name] valuated the patient and recommended outpatient follow-up for lithotripsy.  Please call his office at [phone] to assist with scheduling appointment for patient 3. Patient with significant physical debility in context of history of prior CVA.  He will need continued PT and OT services after arrival to facility 4. Patient has known oropharyngeal dysphagia and will need to be followed by SLP services at facility.  He is currently requiring tube feedings via PEG tube.  This hospitalization he experienced an esophageal perforation that required an esophageal stent that has now been removed.  It is hopeful patient can eventually begin transitioning to oral diet.   Discharge Diagnoses:  Principal Problem:   Pleural empyema ([place]) Active Problems:   Essential hypertension   Atrial tachycardia (HCC)   Dyslipidemia   Transaminitis   Gastroesophageal reflux disease with esophagitis   Barrett's esophagus   Thyroid nodule   BPH (benign prostatic hyperplasia)   Ureteral stone with hydronephrosis   Pneumothorax on right   Acute respiratory failure with hypoxia (HCC)   Malnutrition of moderate degree   Esophageal perforation   Pressure injury of skin   Atrial fibrillation with RVR (HCC)   Right lower lobe pulmonary infiltrate   Dysphagia   Status post thoracentesis   Chronic  diastolic CHF (congestive heart failure) ([place])   Discharge Condition: Stable  Diet recommendation: N.p.o. except for tube feedings (Osmolite 1.5 at 65 cc/h)  Filed Weights   07/09/20 0500 07/10/20 0500 07/11/20 0305  Weight: 59.3 kg 60.8 kg 58 kg    History of present illness:  59 year old with history of Barrett's esophagus, peptic ulcer disease with hiatal hernia, duodenal ulcer, BPH, remote substance abuse admitted on 7/29 with recurrent pneumonia, parapneumonic effusion.  Underwent thoracentesis on 8/2 complicated by pneumothorax.  Repeat CT chest on 8/4 showed esophageal perforation with large right-sided hydropneumothorax.  Transferred from [facility] to [place] on 8/4.  Chest tube placement, VATS with decortication, PEG tube placement and esophageal stent placement.  He also had a esophageal stent placed with follow-up esophagram did not reveal any leak.  During hospitalization also developed new onset atrial fibrillation.  Due to recurrent pneumonia he went into sepsis requiring another round of antibiotics.  Patient was placed on triple regimen meropenem, fluconazole and vancomycin per ID recommendations.  Seen by speech and swallow but has been uncooperative.  Declined all p.o. medications.  CT surgery removed esophageal stent on 9/21.  Esophagram did not show any leak.  Chest tube removed by IR 9/27.  Hospital Course:   Sepsis secondary to community-acquired pneumonia with parapneumonic effusion complicated by hydropneumothorax, recurrent empyema Acute respiratory distress with hypoxia secondary to pneumonia **Resolved** -Sepsis physiology has resolved-patient no longer requiring oxygen. -Chest tube removed by IR 07/09/2020. -IV vancomycin, meropenem and fluconazole pleated 9/26 -Currently stable on room air with O2 sats between 9496%  Esophageal perforation -Resolved  -Stent removed on 9/21.   -Follow-up esophagram negative for any leakage  Oropharyngeal  dysphagia -Status  post PEG tube placement.  -Recommend continued SLP evaluations to determine if patient can transition to oral diet -While on tube feedings recommend continue to follow CBG with SSI as ordered AC/HS  Paroxysmal atrial fibrillation -Echocardiogram shows preserved EF. -Currently on Eliquis and metoprolol with ventricular rates in the 70-80 range  Chronic diastolic congestive heart failure, euvolemic with preserved EF -Not volume overload -Continue beta-blocker -Not on ARB or ACE inhibitor  History of CVA -Continue statin Eliquis  Severe physical deconditioning Must be encouraged to walk with PT.  Noted as of 9/28 with improving gait distance and improving balance and posture.  Patient with very limited verbal communication and lack of verbalization.  Recommendation for skilled nursing facility and mobilize with rolling walker  Moderate protein calorie malnutrition -Continue Osmolite tube feeding at 65 cc/h along with Prosource 45 cc twice daily -Continue multiple vitamins and iron supplementation Nutrition Status: Nutrition Problem: Moderate Malnutrition Etiology: chronic illness (Barrett's esophagus, GERD) Signs/Symptoms: moderate fat depletion, moderate muscle depletion Interventions: Tube feeding Estimated body mass index is 18.87 kg/m as calculated from the following:   Height as of this encounter: 5' 9.02" (1.753 m).   Weight as of this encounter: 58 kg.  Left-sided hydronephrosis with ureteral stone -Urology recommending outpatient follow-up for lithotripsy-please call [facility] after discharge to arrange for follow-up appointment  Depression -Continue Prozac  Procedures:  7/30 echocardiogram 1. Left ventricular ejection fraction, by estimation, is 65 to 70%. The left ventricle has normal function. The left ventricle has no regional wall motion abnormalities. Left ventricular diastolic parameters are consistent with Grade I diastolic  dysfunction (impaired relaxation). 2. Right ventricular systolic function is normal. The right ventricular size is normal. Tricuspid regurgitation signal is inadequate for assessing PA pressure. 3. The mitral valve is grossly normal. Trivial mitral valve regurgitation. 4. The aortic valve is tricuspid. Aortic valve regurgitation is not visualized. 5. The inferior vena cava is normal in size with greater than 50% respiratory variability, suggesting right atrial pressure of 3 mmHg.  8/5 VATS  8/12 EGD with placement of esophageal stent  8/13 VATS  9/20 ED with removal of esophageal stent  Consultations:  Urology  Thoracic surgery  PCCM  Infectious disease  Cardiology  GI  Discharge Exam: Vitals:   07/11/20 0745 07/11/20 1041  BP: 121/86 112/78  Pulse: 80 76  Resp: 20 18  Temp: 98.9 F (37.2 C) 98.5 F (36.9 C)  SpO2: 94% 96%   Gen: Alert, no acute distress, minimal verbal interaction Chest: Clear to auscultation bilaterally without wheezes, rhonchi or crackles, room air, no increased work of breathing Cardiac: Regular rate and rhythm, S1-S2, no rubs murmurs or gallops, no peripheral edema Abdomen: Soft nontender nondistended-PEG tube sutured in place with tube feeding infusing.  No erythema or drainage noted around PEG tube insertion site Extremities: Symmetrical in appearance without cyanosis, clubbing or effusion Psychiatric: Patient is alert and nods appropriately to questions asked but given lack of verbal response unable to accurately assess true orientation  Discharge Instructions   Discharge Instructions    Diet - low sodium heart healthy   Complete by: As directed    Diet Carb Modified   Complete by: As directed    Osmolite 1.5 tube feeding at 65 cc/hr-NPO otherwise   Increase activity slowly   Complete by: As directed    Continue physical therapy and Occupational Therapy after discharge to skilled nursing facility   No wound care   Complete by: As  directed  No wound care   Complete by: As directed      Allergies as of 07/11/2020   No Known Allergies     Medication List    STOP taking these medications   acetaminophen 500 MG tablet Commonly known as: TYLENOL Replaced by: acetaminophen 160 MG/5ML solution   amantadine 100 MG capsule Commonly known as: SYMMETREL   amoxicillin 500 MG capsule Commonly known as: AMOXIL   aspirin 325 MG tablet   benzonatate 100 MG capsule Commonly known as: TESSALON   diltiazem 120 MG 24 hr capsule Commonly known as: CARDIZEM CD   diltiazem 120 MG tablet Commonly known as: CARDIZEM   doxycycline 100 MG tablet Commonly known as: VIBRA-TABS   IRON PO   loratadine 10 MG tablet Commonly known as: CLARITIN   multivitamin with minerals Tabs tablet   sulfamethoxazole-trimethoprim 800-160 MG tablet Commonly known as: BACTRIM DS     TAKE these medications   acetaminophen 160 MG/5ML solution Commonly known as: TYLENOL Place 20.3 mLs (650 mg total) into feeding tube every 6 (six) hours as needed for mild pain, headache or fever. Replaces: acetaminophen 500 MG tablet   apixaban 5 MG Tabs tablet Commonly known as: ELIQUIS Place 1 tablet (5 mg total) into feeding tube 2 (two) times daily.   atorvastatin 40 MG tablet Commonly known as: LIPITOR Place 1 tablet (40 mg total) into feeding tube daily. Start taking on: July 12, 2020 What changed: how to take this   feeding supplement (PROSource TF) liquid Place 45 mLs into feeding tube 2 (two) times daily.   feeding supplement (OSMOLITE 1.5 CAL) Liqd Place 1,560 mLs into feeding tube continuous.   ferrous sulfate 300 (60 Fe) MG/5ML syrup Place 5 mLs (300 mg total) into feeding tube 2 (two) times daily with a meal.   FLUoxetine 20 MG capsule Commonly known as: PROZAC Place 1 capsule (20 mg total) into feeding tube daily.   insulin aspart 100 UNIT/ML injection Commonly known as: novoLOG Inject 0-24 Units into the skin 4  (four) times daily -  before meals and at bedtime.   insulin aspart 100 UNIT/ML injection Commonly known as: novoLOG Inject 0-24 Units into the skin 4 (four) times daily -  before meals and at bedtime.   ipratropium-albuterol 0.5-2.5 (3) MG/3ML Soln Commonly known as: DUONEB Take 3 mLs by nebulization every 4 (four) hours as needed.   metoprolol tartrate 25 mg/10 mL Susp Commonly known as: LOPRESSOR Place 40 mLs (100 mg total) into feeding tube 2 (two) times daily.   multivitamin with minerals Liqd Place 5 mLs into feeding tube daily.   pantoprazole 40 MG tablet Commonly known as: PROTONIX Take 1 tablet (40 mg total) by mouth 2 (two) times daily before a meal.   sennosides 8.8 MG/5ML syrup Commonly known as: SENOKOT Place 5 mLs into feeding tube at bedtime.   tamsulosin 0.4 MG Caps capsule Commonly known as: FLOMAX Take 0.4 mg by mouth daily.   traZODone 50 MG tablet Commonly known as: DESYREL Place 1 tablet (50 mg total) into feeding tube at bedtime.      No Known Allergies  Contact information for follow-up providers    [facility]. Call on 05/14/2020.   Why: f/u for kidney stone Contact information: [place]       Rosita Fire, MD. Schedule an appointment as soon as possible for a visit in 1 week(s).   Specialty: Internal Medicine Contact information: [phone]  [place] [address] [phone]            Contact information for after-discharge care    [facility] .   Service: Skilled Nursing Contact information: [address] [place] [phone]                   The results of significant diagnostics from this hospitalization (including imaging, microbiology, ancillary and laboratory) are listed below for reference.    Significant Diagnostic Studies: DG Chest 2 View  Result  Date: 06/22/2020 CLINICAL DATA:  Fevers, history of esophageal stent placement EXAM: CHEST - 2 VIEW COMPARISON:  06/18/2020, CT from 06/19/2020 FINDINGS: Cardiac shadow is mildly prominent but stable. Mid to distal esophageal stent is again seen and stable. The left lung remains clear. Right lung demonstrates small pleural effusion as well as right lower lobe consolidation. The overall appearance is similar to that seen on the prior exam. A portion of this is shown to represent loculated fluid on recent CT. No bony abnormality is noted. Gastrostomy catheter is noted in the upper abdomen. IMPRESSION: Persistent right-sided effusion and opacity a portion of which is related to loculated fluid. The overall appearance is similar to that seen on prior exam. Electronically Signed   By: Inez Catalina M.D.   On: 06/22/2020 09:35   CT CHEST W CONTRAST  Result Date: 07/02/2020 CLINICAL DATA:  59 year old with community acquired pneumonia and parapneumonic effusion. History of recurrent empyema. Recent CT-guided chest tube placement on 06/25/2020. EXAM: CT CHEST WITH CONTRAST TECHNIQUE: Multidetector CT imaging of the chest was performed during intravenous contrast administration. CONTRAST:  11mL OMNIPAQUE IOHEXOL 300 MG/ML  SOLN COMPARISON:  CT chest 06/19/2020 FINDINGS: Cardiovascular: Normal caliber of the thoracic aorta. Limited evaluation of the pulmonary arteries due to the phase of contrast. Heart size is normal without significant pericardial fluid. Mediastinum/Nodes: Again noted is an esophageal stent involving the mid and distal esophagus. There is some debris within the stent. Proximal esophagus is distended with gas. 1.1 cm low-density left thyroid nodule. Soft tissue in the right subcarinal region measures roughly 1.0 cm on sequence 3, image 82. No significant mediastinal or hilar lymphadenopathy. No significant axillary lymph node enlargement. Lungs/Pleura: A pigtail chest tube has been placed in the posterior  right chest and the large pleural-based collection has resolved. There is no significant fluid around the drain. Small amount of residual right pleural fluid. Stable focal pleural thickening along the posterior right upper chest. No significant left pleural fluid.Material in the dependent aspect of the trachea and right airways could represent mucus or aspirated material. Markedly improved aeration in the right lower lobe since the exam on 06/19/2020. There continues to be volume loss in the right lower lobe. Left lung is clear. Upper Abdomen: Again noted is left hydronephrosis but this is incompletely evaluated. Hyperdense or enhancing structure at the hepatic dome measures roughly 8 mm and this may represent a flash filling hemangioma but indeterminate. No acute abnormality in the upper abdomen. Musculoskeletal: No acute bone abnormality. IMPRESSION: 1. Loculated right pleural collection has resolved since placement of the pigtail chest tube drain. No significant residual fluid in this area. There is a small residual right pleural effusion. 2. Improved aeration in the right lung. 3. Material in the trachea and right lower lung airways could be related to mucous but cannot exclude aspiration based on the dilated esophagus and esophageal stent. 4. Indeterminate hyperdense or  hypervascular structure the hepatic dome measuring roughly 8 mm. This could represent a flash filling hemangioma but indeterminate. This is likely an incidental finding. 5. Partial visualization of the left hydronephrosis. Electronically Signed   By: Markus Daft M.D.   On: 07/02/2020 13:20   CT CHEST W CONTRAST  Result Date: 06/19/2020 CLINICAL DATA:  Pneumonia.  Effusion or abscess suspected. EXAM: CT CHEST WITH CONTRAST TECHNIQUE: Multidetector CT imaging of the chest was performed during intravenous contrast administration. CONTRAST:  30mL OMNIPAQUE IOHEXOL 300 MG/ML  SOLN COMPARISON:  Chest radiographs 06/18/2020, CT chest 06/02/2020  FINDINGS: Cardiovascular: The heart size is upper limits of normal, similar to prior. Coronary artery calcifications. No evidence of pericardial effusion or aortic aneurysm. Mediastinum/Nodes: Redemonstrated esophageal stent with debris/fluid layering within the esophagus. There is stranding of the posterior mediastinal fat adjacent to the esophageal stent. No discrete fluid collection identified within the mediastinum. Previously described the described gas along the right aspect of the stent has resolved. There is a small amount of air adjacent to the left aspect of the stent distally and along the right proximal aspect of the stent. Mildly prominent mediastinal and right hilar lymph nodes, most likely reactive. Lungs/Pleura: Interval increase in a posteromedial loculated right pleural collection, which now measures up to 7.8 by 5.0 cm. The collection has a few punctate areas of internal gas surrounding pleura is enhancing. Overlying linear opacities, compatible with compressive atelectasis. Otherwise, the lungs are clear. Upper Abdomen: Probable moderate left hydronephrosis, partially imaged but grossly similar to prior stress CT. Musculoskeletal: No chest wall abnormality. No acute or significant osseous findings. IMPRESSION: 1. Interval increase in a loculated right posteromedial pleural fluid collection with surrounding enhancing pleura, which is sterility indeterminate by imaging but possibly an empyema. 2. Redemonstrated esophageal stent with layering debris/fluid. Stranding in the surrounding posterior mediastinum could be postsurgical or relate to persistent esophageal leak and/or mediastinitis. No discrete drainable mediastinal fluid collection. 3. Similar small right pleural effusion with overlying compressive atelectasis. Electronically Signed   By: Margaretha Sheffield MD   On: 06/19/2020 10:31   CT ABDOMEN PELVIS W CONTRAST  Result Date: 06/22/2020 CLINICAL DATA:  Nausea and vomiting, vomited tube  feedings EXAM: CT ABDOMEN AND PELVIS WITH CONTRAST TECHNIQUE: Multidetector CT imaging of the abdomen and pelvis was performed using the standard protocol following bolus administration of intravenous contrast. CONTRAST:  126mL OMNIPAQUE IOHEXOL 300 MG/ML  SOLN COMPARISON:  06/19/2020, 05/13/2020 FINDINGS: Lower chest: Persistent complex loculated right pleural fluid collection, suspicious for empyema/abscess given history of esophageal perforation. Trace free-flowing right pleural fluid. Significant consolidation of the right lower lobe consistent with atelectasis. Esophageal stent identified, with fluid and debris within the esophageal lumen. Hepatobiliary: No focal liver abnormality is seen. No gallstones, gallbladder wall thickening, or biliary dilatation. Pancreas: Unremarkable. No pancreatic ductal dilatation or surrounding inflammatory changes. Spleen: Normal in size without focal abnormality. Adrenals/Urinary Tract: Persistent left-sided hydronephrosis related to an obstructing 11 mm proximal left ureteral calculus. Multiple punctate nonobstructing left renal calculi again identified unchanged. Punctate nonobstructing right renal calculi are identified. Right kidney enhances normally. No right-sided obstruction. Bladder is unremarkable.  The adrenals are normal. Stomach/Bowel: Percutaneous gastrostomy tube is identified. No bowel obstruction or ileus. No bowel wall thickening or inflammatory change. Vascular/Lymphatic: Aortic atherosclerosis. No enlarged abdominal or pelvic lymph nodes. Reproductive: Prostate is unremarkable. Other: No free fluid or free intraperitoneal gas. No abdominal wall hernia. Musculoskeletal: No acute or destructive bony lesions. Reconstructed images demonstrate no additional findings. IMPRESSION: 1. Persistent  complex loculated right pleural fluid collection, concerning for abscess. Persistent esophageal perforation cannot be excluded in light of previous findings. 2. Stable  esophageal stent, with debris and fluid in the esophageal lumen as before. 3. Persistent left-sided hydronephrosis related to an 11 mm obstructing proximal left ureteral calculus. 4. Other bilateral nonobstructing renal calculi 5.  Aortic Atherosclerosis (ICD10-I70.0). Electronically Signed   By: Randa Ngo M.D.   On: 06/22/2020 19:38   DG Fluoro Rm 1-60 Min  Result Date: 07/04/2020 CLINICAL DATA:  Esophageal anastomotic leak. EXAM: DG C-ARM 1-60 MIN CONTRAST:  Patient did not swallow contrast. FLUOROSCOPY TIME:  Radiation Exposure Index (if provided by the fluoroscopic device): 4.2 mGy. COMPARISON:  July 02, 2020. FINDINGS: An attempt was made to perform an esophagram. However, the patient declined to drink contrast despite repeated attempts to convince him to do so. The procedure was discontinued and the patient was returned to the floor. IMPRESSION: Unsuccessful attempt to perform esophagram as patient declined to drink contrast. Electronically Signed   By: Marijo Conception M.D.   On: 07/04/2020 10:12   DG Chest Port 1 View  Result Date: 07/09/2020 CLINICAL DATA:  Chest tube removal. EXAM: PORTABLE CHEST 1 VIEW COMPARISON:  Radiograph yesterday. FINDINGS: Removal of pigtail catheter previously projecting over the lower right chest. There is no visualized pneumothorax. No radiographic evidence of pleural effusion. There are streaky opacities at the right lung base. The left lung is clear. Stable heart size and mediastinal contours from prior exam. No acute osseous abnormalities seen. IMPRESSION: 1. Removal of pigtail catheter previously projecting over the lower right chest. No visualized pneumothorax. 2. Streaky right lung base opacities, likely atelectasis. Electronically Signed   By: Keith Rake M.D.   On: 07/09/2020 16:58   DG Chest Port 1 View  Result Date: 07/08/2020 CLINICAL DATA:  Shortness of breath. EXAM: PORTABLE CHEST 1 VIEW COMPARISON:  Chest radiograph June 27, 2020  FINDINGS: Stable cardiac and mediastinal contours. Esophageal stent no longer visualized. Slight interval improvement in right mid lower lung airspace opacities. Right pleural drain remains coiled projecting over the right lower hemithorax. No large pleural effusion or pneumothorax. IMPRESSION: 1. Right pleural drain remains coiled projecting over the right lower hemithorax. 2. Slight interval improvement in right mid and lower lung airspace opacities. Electronically Signed   By: Lovey Newcomer M.D.   On: 07/08/2020 14:13   DG Chest Port 1 View  Result Date: 06/27/2020 CLINICAL DATA:  Empyema of right pleural space. EXAM: PORTABLE CHEST 1 VIEW COMPARISON:  June 22, 2020. FINDINGS: Stable cardiomediastinal silhouette. Esophageal stent is unchanged in position. There is interval placement of pleural drainage catheter in the right lung base. Right pleural effusion noted on prior exam is slightly decreased in size. No pneumothorax is noted. Left lung is clear. Bony thorax is unremarkable. IMPRESSION: Interval placement of pleural drainage catheter in right lung base. Right pleural effusion noted on prior exam is slightly decreased in size. Electronically Signed   By: Marijo Conception M.D.   On: 06/27/2020 12:25   DG CHEST PORT 1 VIEW  Result Date: 06/18/2020 CLINICAL DATA:  Rhonchi EXAM: PORTABLE CHEST 1 VIEW COMPARISON:  Eight days ago FINDINGS: Increased hazy density at the right base where there is a pleural effusion and atelectasis by recent CT. Stable positioning of the esophageal stent. Normal heart size and aortic contours. No visible pneumothorax. IMPRESSION: Right pleural effusion and lower lobe collapse with mildly increased opacity since 06/10/2020. Electronically Signed   By:  Monte Fantasia M.D.   On: 06/18/2020 09:47   CT IMAGE GUIDED DRAINAGE BY PERCUTANEOUS CATHETER  Result Date: 06/25/2020 INDICATION: 59 year old male with left-sided empyema versus lung abscess EXAM: CT GUIDED DRAINAGE OF   ABSCESS MEDICATIONS: The patient is currently admitted to the hospital and receiving intravenous antibiotics. The antibiotics were administered within an appropriate time frame prior to the initiation of the procedure. ANESTHESIA/SEDATION: 0.5 mg IV Versed 25 mcg IV Fentanyl Moderate Sedation Time:  13 minutes The patient was continuously monitored during the procedure by the interventional radiology nurse under my direct supervision. COMPLICATIONS: None TECHNIQUE: Informed written consent was obtained from the patient after a thorough discussion of the procedural risks, benefits and alternatives. All questions were addressed. Maximal Sterile Barrier Technique was utilized including caps, mask, sterile gowns, sterile gloves, sterile drape, hand hygiene and skin antiseptic. A timeout was performed prior to the initiation of the procedure. PROCEDURE: The operative field was prepped with Chlorhexidine in a sterile fashion, and a sterile drape was applied covering the operative field. A sterile gown and sterile gloves were used for the procedure. Local anesthesia was provided with 1% Lidocaine. Scout CT was acquired. Patient was prepped and draped in the usual sterile fashion. 1% lidocaine was used for local anesthesia. Yueh needle was passed into the fluid collection of the right chest confirming the needle tip position with aspiration of purulent fluid. Using modified Seldinger technique, a 12 French drain was placed and attached to evacuation chamber. Sample was sent for culture Drain was sutured in position.  Sterile bandage was placed. Patient tolerated the procedure well and remained hemodynamically stable throughout. No complications were encountered and no significant blood loss. FINDINGS: Left chest fluid collection, either empyema or lung abscess. IMPRESSION: Status post CT-guided drainage of right chest abscess/empyema. Signed, Dulcy Fanny. Dellia Nims, RPVI [facility]  [facility] Radiology Electronically Signed   By: Corrie Mckusick D.O.   On: 06/25/2020 13:06   HYBRID OR IMAGING ([facility] ONLY)  Result Date: 07/03/2020 There is no interpretation for this exam.  This order is for images obtained during a surgical procedure.  Please See "Surgeries" Tab for more information regarding the procedure.    Microbiology: Recent Results (from the past 240 hour(s))  Resp Panel by RT PCR (RSV, Flu A&B, Covid) - Nasopharyngeal Swab     Status: None   Collection Time: 07/11/20  9:15 AM   Specimen: Nasopharyngeal Swab  Result Value Ref Range Status   SARS Coronavirus 2 by RT PCR NEGATIVE NEGATIVE Final    Comment: (NOTE) SARS-CoV-2 target nucleic acids are NOT DETECTED.  The SARS-CoV-2 RNA is generally detectable in upper respiratoy specimens during the acute phase of infection. The lowest concentration of SARS-CoV-2 viral copies this assay can detect is 131 copies/mL. A negative result does not preclude SARS-Cov-2 infection and should not be used as the sole basis for treatment or other patient management decisions. A negative result may occur with  improper specimen collection/handling, submission of specimen other than nasopharyngeal swab, presence of viral mutation(s) within the areas targeted by this assay, and inadequate number of viral copies (<131 copies/mL). A negative result must be combined with clinical observations, patient history, and epidemiological information. The expected result is Negative.  Fact Sheet for Patients:  PinkCheek.be  Fact Sheet for Healthcare Providers:  GravelBags.it  This test is no t yet approved or cleared by the [place] FDA and  has been authorized for detection and/or diagnosis of SARS-CoV-2 by  FDA under an Emergency Use Authorization (EUA). This EUA will remain  in effect (meaning this test can be used) for the duration of the COVID-19 declaration under Section  564(b)(1) of the Act, 21 U.S.C. section 360bbb-3(b)(1), unless the authorization is terminated or revoked sooner.     Influenza A by PCR NEGATIVE NEGATIVE Final   Influenza B by PCR NEGATIVE NEGATIVE Final    Comment: (NOTE) The Xpert Xpress SARS-CoV-2/FLU/RSV assay is intended as an aid in  the diagnosis of influenza from Nasopharyngeal swab specimens and  should not be used as a sole basis for treatment. Nasal washings and  aspirates are unacceptable for Xpert Xpress SARS-CoV-2/FLU/RSV  testing.  Fact Sheet for Patients: PinkCheek.be  Fact Sheet for Healthcare Providers: GravelBags.it  This test is not yet approved or cleared by the [place] FDA and  has been authorized for detection and/or diagnosis of SARS-CoV-2 by  FDA under an Emergency Use Authorization (EUA). This EUA will remain  in effect (meaning this test can be used) for the duration of the  Covid-19 declaration under Section 564(b)(1) of the Act, 21  U.S.C. section 360bbb-3(b)(1), unless the authorization is  terminated or revoked.    Respiratory Syncytial Virus by PCR NEGATIVE NEGATIVE Final    Comment: (NOTE) Fact Sheet for Patients: PinkCheek.be  Fact Sheet for Healthcare Providers: GravelBags.it  This test is not yet approved or cleared by the [place] FDA and  has been authorized for detection and/or diagnosis of SARS-CoV-2 by  FDA under an Emergency Use Authorization (EUA). This EUA will remain  in effect (meaning this test can be used) for the duration of the  COVID-19 declaration under Section 564(b)(1) of the Act, 21 U.S.C.  section 360bbb-3(b)(1), unless the authorization is terminated or  revoked. Performed at [facility], [place] [address] [place], [place] [address]      Labs: Basic Metabolic Panel: Recent Labs  Lab 07/05/20 0155 07/08/20 0124 07/09/20 0709  07/10/20 0208 07/11/20 0247  NA 137 137 135 136 138  K 4.1 4.4 4.4 4.1 4.3  CL 102 102 102 100 100  CO2 27 27 28 27 27   GLUCOSE 126* 104* 131* 125* 124*  BUN 12 14 14 19 20   CREATININE 0.62 0.72 0.72 0.69 0.77  CALCIUM 8.7* 9.3 9.3 9.4 9.8  MG  --  1.9 1.9 2.0 2.0  PHOS  --  3.2  --   --   --    Liver Function Tests: Recent Labs  Lab 07/09/20 0709 07/10/20 0208 07/11/20 0247  AST 21 22 24   ALT 36 38 40  ALKPHOS 101 101 104  BILITOT 0.5 0.5 0.4  PROT 7.2 7.4 7.7  ALBUMIN 2.7* 2.8* 2.9*   No results for input(s): LIPASE, AMYLASE in the last 168 hours. No results for input(s): AMMONIA in the last 168 hours. CBC: Recent Labs  Lab 07/05/20 0155 07/06/20 0131 07/08/20 0124  WBC 4.9 5.8 6.1  NEUTROABS 2.6  --   --   HGB 8.5* 8.9* 9.8*  HCT 29.7* 31.7* 35.0*  MCV 79.6* 80.3 79.9*  PLT 457* 487* 421*   Cardiac Enzymes: No results for input(s): CKTOTAL, CKMB, CKMBINDEX, TROPONINI in the last 168 hours. BNP: BNP (last 3 results) Recent Labs    05/10/20 2111 05/14/20 0604 07/02/20 0054  BNP 146.0* 68.0 24.8    ProBNP (last 3 results) No results for input(s): PROBNP in the last 8760 hours.  CBG: Recent Labs  Lab 07/10/20 1045 07/10/20 1625  07/10/20 2138 07/11/20 0623 07/11/20 1038  GLUCAP 123* 124* 115* 137* 108*       Signed:  Erin Hearing ANP  [facility] 07/11/2020, 11:29 AM

## 2020-07-12 LAB — BASIC METABOLIC PANEL
Anion gap: 10 (ref 5–15)
BUN: 23 mg/dL — ABNORMAL HIGH (ref 6–20)
CO2: 27 mmol/L (ref 22–32)
Calcium: 9.9 mg/dL (ref 8.9–10.3)
Chloride: 100 mmol/L (ref 98–111)
Creatinine, Ser: 0.79 mg/dL (ref 0.61–1.24)
GFR calc Af Amer: >60 mL/min (ref >60)
GFR calc non Af Amer: >60 mL/min (ref >60)
Glucose, Bld: 128 mg/dL — ABNORMAL HIGH (ref 70–99)
Potassium: 4.2 mmol/L (ref 3.5–5.1)
Sodium: 137 mmol/L (ref 135–145)

## 2020-07-12 LAB — ACID FAST CULTURE WITH REFLEXED SENSITIVITIES (MYCOBACTERIA): Acid Fast Culture: NEGATIVE

## 2020-07-12 LAB — GLUCOSE, CAPILLARY
Glucose-Capillary: 134 mg/dL — ABNORMAL HIGH (ref 70–99)
Glucose-Capillary: 144 mg/dL — ABNORMAL HIGH (ref 70–99)

## 2020-07-12 LAB — MAGNESIUM: Magnesium: 1.9 mg/dL (ref 1.7–2.4)

## 2020-07-12 MED ORDER — OSMOLITE 1.5 CAL PO LIQD: 65.0000 mL/h | ORAL | 0 refills | Status: AC

## 2020-07-12 MED FILL — OSMOLITE 1.5 CAL LIQD: 3 days supply | Qty: 4000 | Fill #0

## 2020-07-12 NOTE — Plan of Care (Signed)

## 2020-07-12 NOTE — Progress Notes (Signed)
Patient seen and examined at bedside, no new complaints.  Does not engage much in conversation.  Overall feels okay.  Vital signs are stable. TOC team working with Erin Hearing NP for disposition planning. Discussed with patient's RN.  Gerlean Ren MD [facility]

## 2020-07-12 NOTE — Progress Notes (Signed)
[facility] PROGRESS NOTE  Chisum Habenicht Buehner ERX:[id number] DOB: Mar 09, 1961 DOA: 05/10/2020 PCP: Rosita Fire, MD  Status: Inpatient--Remains inpatient appropriate because:Altered mental status, Unsafe d/c plan and Inpatient level of care appropriate due to severity of illness  Dispo:  Patient From:  HOME  Planned Disposition:  SNF  Expected discharge date: 07/12/20  Medically stable for discharge:  YES-awaiting confirmation of insurance authorization for SNF as well as for transportation to facility   Code Status: Full Family Communication: Patient DVT prophylaxis: Lovenox Vaccination status: Not immunized against Covid-Covid screen negative  HPI: 59 year old with history of Barrett's esophagus, peptic ulcer disease with hiatal hernia, duodenal ulcer, BPH, remote substance abuse admitted on 7/29 with recurrent pneumonia, parapneumonic effusion. Underwent thoracentesis on 8/2 complicated by pneumothorax. Repeat CT chest on 8/4 showed esophageal perforation with large right-sided hydropneumothorax. Transferred from [facility] to [place] on 8/4. Chest tube placement, VATS with decortication, PEG tube placement and esophageal stent placement. He also had a esophageal stent placed with follow-up esophagram did not reveal any leak. During hospitalization also developed new onset atrial fibrillation. Due to recurrent pneumonia he went into sepsis requiring another round of antibiotics. Patient was placed on triple regimen meropenem, fluconazole and vancomycin per ID recommendations. Seen by speech and swallow but has been uncooperative. Declined all p.o. medications. CT surgery removed esophageal stent on 9/21. Esophagram did not show any leak. Chest tube removed by IR 9/27.  Subjective: Awakened.  No complaints.  Nods appropriately to questions asked.  Objective: Vitals:   07/12/20 0302 07/12/20 0719  BP: 116/85 116/72  Pulse: 78 85  Resp: 18 19  Temp: 97.7  F (36.5 C) 98.3 F (36.8 C)  SpO2: 94% 95%    Intake/Output Summary (Last 24 hours) at 07/12/2020 1029 Last data filed at 07/12/2020 0700 Gross per 24 hour  Intake 1755 ml  Output 825 ml  Net 930 ml   Filed Weights   07/10/20 0500 07/11/20 0305 07/12/20 0302  Weight: 60.8 kg 58 kg 57.5 kg    Exam: Constitutional: NAD, calm, comfortable Respiratory: clear to auscultation bilaterally, no wheezing, no crackles. Normal respiratory effort.  Room air Cardiovascular: Regular rate and rhythm, no murmurs / rubs / gallops. No extremity edema.  Extremities warm to touch Abdomen: no tenderness, no masses palpated.  PEG tube in place with tube feeding infusing.  Bowel sounds positive.  Musculoskeletal: no clubbing / cyanosis. No joint deformity upper and lower extremities. no contractures. Normal muscle tone.  Skin: no rashes, lesions, ulcers. No induration Neurologic: CN 2-12 grossly intact. Sensation intact, DTR normal. Strength 5/5 x all 4 extremities.  Psychiatric: Awake and oriented times name.  Appears to be oriented to place but patient with limited verbal response but does not appropriately to questions asked, affect.   Assessment/Plan: Sepsis secondary to community-acquired pneumonia with parapneumonic effusion complicated by hydropneumothorax, recurrent empyema Acute respiratory distress with hypoxia secondary to pneumonia **Resolved** -Sepsis physiology has resolved-patient no longer requiring oxygen. -Chest tube removed by IR 07/09/2020. -IV vancomycin, meropenem and fluconazole pleated 9/26 -Currently stable on room air with O2 sats between 94 and 96%  Esophageal perforation -Resolved -Stent removed on 9/21.  -Follow-up esophagram negative for any leakage  Oropharyngeal dysphagia -Status post PEG tube placement.  -Recommend continued SLP evaluations to determine if patient can transition to oral diet -While on tube feedings recommend continue to follow CBG with SSI as  ordered AC/HS  Paroxysmal atrial fibrillation -Echocardiogram shows preserved EF. -Currently on Eliquis and metoprolol  with ventricular rates in the 70-80 range  Chronic diastolic congestive heart failure, euvolemic with preserved EF -Not volume overload -Continue beta-blocker -Not on ARB or ACE inhibitor  History of CVA -Continue statin Eliquis  Severe physical deconditioning Must be encouraged to walk with PT.  Noted as of 9/28 with improving gait distance and improving balance and posture.  Patient with very limited verbal communication and lack of verbalization.  Recommendation for skilled nursing facility and mobilize with rolling walker  Moderate protein calorie malnutrition -Continue Osmolite tube feeding at 65 cc/h along with Prosource 45 cc twice daily -Continue multiple vitamins and iron supplementation -Nutrition Problem: Moderate Malnutrition -Etiology: chronic illness (Barrett's esophagus, GERD) -Signs/Symptoms: moderate fat depletion, moderate muscle depletion -Interventions: Tube feeding -Estimated body mass index is 18.87 kg/m as calculated from the following:   Height as of this encounter: 5' 9.02" (1.753 m).   Weight as of this encounter: 58 kg.  Left-sided hydronephrosis with ureteral stone -Urology recommending outpatient follow-up for lithotripsy-please call [facility] after discharge to arrange for follow-up appointment  Depression -Continue Prozac    Data Reviewed: Basic Metabolic Panel: Recent Labs  Lab 07/08/20 0124 07/09/20 0709 07/10/20 0208 07/11/20 0247 07/12/20 0221  NA 137 135 136 138 137  K 4.4 4.4 4.1 4.3 4.2  CL 102 102 100 100 100  CO2 27 28 27 27 27   GLUCOSE 104* 131* 125* 124* 128*  BUN 14 14 19 20  23*  CREATININE 0.72 0.72 0.69 0.77 0.79  CALCIUM 9.3 9.3 9.4 9.8 9.9  MG 1.9 1.9 2.0 2.0 1.9  PHOS 3.2  --   --   --   --    Liver Function Tests: Recent Labs  Lab 07/09/20 0709 07/10/20 0208  07/11/20 0247  AST 21 22 24   ALT 36 38 40  ALKPHOS 101 101 104  BILITOT 0.5 0.5 0.4  PROT 7.2 7.4 7.7  ALBUMIN 2.7* 2.8* 2.9*   No results for input(s): LIPASE, AMYLASE in the last 168 hours. No results for input(s): AMMONIA in the last 168 hours. CBC: Recent Labs  Lab 07/06/20 0131 07/08/20 0124  WBC 5.8 6.1  HGB 8.9* 9.8*  HCT 31.7* 35.0*  MCV 80.3 79.9*  PLT 487* 421*   Cardiac Enzymes: No results for input(s): CKTOTAL, CKMB, CKMBINDEX, TROPONINI in the last 168 hours. BNP (last 3 results) Recent Labs    05/10/20 2111 05/14/20 0604 07/02/20 0054  BNP 146.0* 68.0 24.8    ProBNP (last 3 results) No results for input(s): PROBNP in the last 8760 hours.  CBG: Recent Labs  Lab 07/11/20 0623 07/11/20 1038 07/11/20 1550 07/11/20 2132 07/12/20 0622  GLUCAP 137* 108* 113* 105* 134*    Recent Results (from the past 240 hour(s))  Resp Panel by RT PCR (RSV, Flu A&B, Covid) - Nasopharyngeal Swab     Status: None   Collection Time: 07/11/20  9:15 AM   Specimen: Nasopharyngeal Swab  Result Value Ref Range Status   SARS Coronavirus 2 by RT PCR NEGATIVE NEGATIVE Final    Comment: (NOTE) SARS-CoV-2 target nucleic acids are NOT DETECTED.  The SARS-CoV-2 RNA is generally detectable in upper respiratoy specimens during the acute phase of infection. The lowest concentration of SARS-CoV-2 viral copies this assay can detect is 131 copies/mL. A negative result does not preclude SARS-Cov-2 infection and should not be used as the sole basis for treatment or other patient management decisions. A negative result may occur with  improper specimen collection/handling, submission of specimen  other than nasopharyngeal swab, presence of viral mutation(s) within the areas targeted by this assay, and inadequate number of viral copies (<131 copies/mL). A negative result must be combined with clinical observations, patient history, and epidemiological information. The expected result  is Negative.  Fact Sheet for Patients:  PinkCheek.be  Fact Sheet for Healthcare Providers:  GravelBags.it  This test is no t yet approved or cleared by the [place] FDA and  has been authorized for detection and/or diagnosis of SARS-CoV-2 by FDA under an Emergency Use Authorization (EUA). This EUA will remain  in effect (meaning this test can be used) for the duration of the COVID-19 declaration under Section 564(b)(1) of the Act, 21 U.S.C. section 360bbb-3(b)(1), unless the authorization is terminated or revoked sooner.     Influenza A by PCR NEGATIVE NEGATIVE Final   Influenza B by PCR NEGATIVE NEGATIVE Final    Comment: (NOTE) The Xpert Xpress SARS-CoV-2/FLU/RSV assay is intended as an aid in  the diagnosis of influenza from Nasopharyngeal swab specimens and  should not be used as a sole basis for treatment. Nasal washings and  aspirates are unacceptable for Xpert Xpress SARS-CoV-2/FLU/RSV  testing.  Fact Sheet for Patients: PinkCheek.be  Fact Sheet for Healthcare Providers: GravelBags.it  This test is not yet approved or cleared by the [place] FDA and  has been authorized for detection and/or diagnosis of SARS-CoV-2 by  FDA under an Emergency Use Authorization (EUA). This EUA will remain  in effect (meaning this test can be used) for the duration of the  Covid-19 declaration under Section 564(b)(1) of the Act, 21  U.S.C. section 360bbb-3(b)(1), unless the authorization is  terminated or revoked.    Respiratory Syncytial Virus by PCR NEGATIVE NEGATIVE Final    Comment: (NOTE) Fact Sheet for Patients: PinkCheek.be  Fact Sheet for Healthcare Providers: GravelBags.it  This test is not yet approved or cleared by the [place] FDA and  has been authorized for detection and/or diagnosis  of SARS-CoV-2 by  FDA under an Emergency Use Authorization (EUA). This EUA will remain  in effect (meaning this test can be used) for the duration of the  COVID-19 declaration under Section 564(b)(1) of the Act, 21 U.S.C.  section 360bbb-3(b)(1), unless the authorization is terminated or  revoked. Performed at [facility], [place] [address] [place], [place] [address]      Studies: No results found.  Scheduled Meds: . apixaban  5 mg Per Tube BID  . atorvastatin  40 mg Per Tube Daily  . feeding supplement (PROSource TF)  45 mL Per Tube BID  . FLUoxetine  20 mg Per Tube Daily  . insulin aspart  0-24 Units Subcutaneous TID AC & HS  . mouth rinse  15 mL Mouth Rinse q12n4p  . metoprolol tartrate  100 mg Per Tube BID  . nystatin  5 mL Mouth/Throat QID  . pantoprazole sodium  40 mg Per Tube BID  . sennosides  5 mL Per Tube QHS  . traZODone  50 mg Per Tube QHS   Continuous Infusions: . sodium chloride 1,000 mL (06/06/20 0028)  . sodium chloride    . feeding supplement (OSMOLITE 1.5 CAL) 65 mL/hr at 07/12/20 0700    Principal Problem:   Pleural empyema ([place]) Active Problems:   Essential hypertension   Atrial tachycardia (HCC)   Dyslipidemia   Transaminitis   Gastroesophageal reflux disease with esophagitis   Barrett's esophagus   Thyroid nodule   BPH (benign prostatic hyperplasia)  Ureteral stone with hydronephrosis   Pneumothorax on right   Acute respiratory failure with hypoxia (HCC)   Malnutrition of moderate degree   Esophageal perforation   Pressure injury of skin   Atrial fibrillation with RVR (HCC)   Right lower lobe pulmonary infiltrate   Dysphagia   Status post thoracentesis   Chronic diastolic CHF (congestive heart failure) [facility])   Consultants:  Urology  Thoracic surgery  PCCM  Infectious disease  Cardiology  GI  Procedures:  7/30 echocardiogram 1. Left ventricular ejection fraction, by estimation, is 65 to 70%. The left ventricle has  normal function. The left ventricle has no regional wall motion abnormalities. Left ventricular diastolic parameters are consistent with Grade I diastolic dysfunction (impaired relaxation). 2. Right ventricular systolic function is normal. The right ventricular size is normal. Tricuspid regurgitation signal is inadequate for assessing PA pressure. 3. The mitral valve is grossly normal. Trivial mitral valve regurgitation. 4. The aortic valve is tricuspid. Aortic valve regurgitation is not visualized. 5. The inferior vena cava is normal in size with greater than 50% respiratory variability, suggesting right atrial pressure of 3 mmHg.  8/5 VATS  8/12 EGD with placement of esophageal stent  8/13 VATS  9/20 ED with removal of esophageal stent  Antibiotics: Anti-infectives (From admission, onward)   Start     Dose/Rate Route Frequency Ordered Stop   07/06/20 0600  vancomycin (VANCOREADY) IVPB 750 mg/150 mL        750 mg 150 mL/hr over 60 Minutes Intravenous Every 12 hours 07/05/20 1553 07/08/20 1823   06/28/20 2300  vancomycin (VANCOREADY) IVPB 750 mg/150 mL  Status:  Discontinued        750 mg 150 mL/hr over 60 Minutes Intravenous Every 8 hours 06/28/20 1519 07/05/20 1553   06/26/20 1300  fluconazole (DIFLUCAN) IVPB 400 mg        400 mg 100 mL/hr over 120 Minutes Intravenous Every 24 hours 06/25/20 1513 07/08/20 2359   06/26/20 0100  vancomycin (VANCOREADY) IVPB 500 mg/100 mL  Status:  Discontinued        500 mg 100 mL/hr over 60 Minutes Intravenous Every 12 hours 06/25/20 1240 06/28/20 1519   06/25/20 1600  meropenem (MERREM) 1 g in sodium chloride 0.9 % 100 mL IVPB        1 g 200 mL/hr over 30 Minutes Intravenous Every 8 hours 06/25/20 1513 07/08/20 1603   06/25/20 1600  fluconazole (DIFLUCAN) IVPB 200 mg        200 mg 100 mL/hr over 60 Minutes Intravenous  Once 06/25/20 1515 06/25/20 1902   06/25/20 1300  fluconazole (DIFLUCAN) tablet 200 mg        200 mg Oral  Once 06/25/20 1209  06/25/20 1308   06/25/20 1245  vancomycin (VANCOREADY) IVPB 1250 mg/250 mL        1,250 mg 166.7 mL/hr over 90 Minutes Intravenous  Once 06/25/20 1238 06/25/20 1445   06/22/20 2030  ceFEPIme (MAXIPIME) 2 g in sodium chloride 0.9 % 100 mL IVPB  Status:  Discontinued        2 g 200 mL/hr over 30 Minutes Intravenous Every 8 hours 06/22/20 2020 06/25/20 1432   06/03/20 0200  vancomycin (VANCOCIN) IVPB 1000 mg/200 mL premix  Status:  Discontinued        1,000 mg 200 mL/hr over 60 Minutes Intravenous Every 12 hours 06/02/20 1248 06/05/20 0915   06/02/20 2200  ceFEPIme (MAXIPIME) 2 g in sodium chloride 0.9 % 100 mL IVPB  2 g 200 mL/hr over 30 Minutes Intravenous Every 8 hours 06/02/20 1251 06/08/20 2359   06/02/20 2000  piperacillin-tazobactam (ZOSYN) IVPB 3.375 g  Status:  Discontinued        3.375 g 12.5 mL/hr over 240 Minutes Intravenous Every 8 hours 06/02/20 1248 06/02/20 1249   06/02/20 1330  vancomycin (VANCOREADY) IVPB 1250 mg/250 mL        1,250 mg 166.7 mL/hr over 90 Minutes Intravenous  Once 06/02/20 1248 06/02/20 1646   06/02/20 1330  piperacillin-tazobactam (ZOSYN) IVPB 3.375 g  Status:  Discontinued        3.375 g 100 mL/hr over 30 Minutes Intravenous  Once 06/02/20 1248 06/02/20 1249   06/02/20 1215  vancomycin (VANCOCIN) IVPB 1000 mg/200 mL premix  Status:  Discontinued        1,000 mg 200 mL/hr over 60 Minutes Intravenous  Once 06/02/20 1202 06/02/20 1251   06/02/20 1215  ceFEPIme (MAXIPIME) 2 g in sodium chloride 0.9 % 100 mL IVPB        2 g 200 mL/hr over 30 Minutes Intravenous  Once 06/02/20 1202 06/02/20 1506   05/29/20 1800  vancomycin (VANCOCIN) IVPB 1000 mg/200 mL premix  Status:  Discontinued        1,000 mg 200 mL/hr over 60 Minutes Intravenous Every 12 hours 05/29/20 1151 05/30/20 0912   05/24/20 2100  vancomycin (VANCOREADY) IVPB 750 mg/150 mL  Status:  Discontinued        750 mg 150 mL/hr over 60 Minutes Intravenous Every 12 hours 05/24/20 0813 05/29/20  1151   05/24/20 0900  vancomycin (VANCOREADY) IVPB 1500 mg/300 mL        1,500 mg 150 mL/hr over 120 Minutes Intravenous  Once 05/24/20 0813 05/24/20 1221   05/17/20 0900  fluconazole (DIFLUCAN) IVPB 400 mg  Status:  Discontinued        400 mg 100 mL/hr over 120 Minutes Intravenous Every 24 hours 05/17/20 0749 06/05/20 0915   05/17/20 0600  vancomycin (VANCOREADY) IVPB 750 mg/150 mL  Status:  Discontinued        750 mg 150 mL/hr over 60 Minutes Intravenous Every 8 hours 05/16/20 2154 05/17/20 0903   05/17/20 0400  vancomycin (VANCOREADY) IVPB 750 mg/150 mL  Status:  Discontinued        750 mg 150 mL/hr over 60 Minutes Intravenous Every 8 hours 05/16/20 1945 05/16/20 2154   05/16/20 2200  vancomycin (VANCOREADY) IVPB 1250 mg/250 mL        1,250 mg 166.7 mL/hr over 90 Minutes Intravenous STAT 05/16/20 2153 05/16/20 2348   05/16/20 2000  vancomycin (VANCOREADY) IVPB 1250 mg/250 mL  Status:  Discontinued        1,250 mg 166.7 mL/hr over 90 Minutes Intravenous  Once 05/16/20 1945 05/16/20 2153   05/15/20 1600  piperacillin-tazobactam (ZOSYN) IVPB 3.375 g  Status:  Discontinued        3.375 g 12.5 mL/hr over 240 Minutes Intravenous Every 8 hours 05/15/20 0945 05/30/20 0912   05/15/20 0930  piperacillin-tazobactam (ZOSYN) IVPB 3.375 g        3.375 g 100 mL/hr over 30 Minutes Intravenous  Once 05/15/20 0921 05/15/20 1009   05/13/20 2000  azithromycin (ZITHROMAX) tablet 500 mg        500 mg Oral Daily 05/13/20 1155 05/14/20 2127   05/10/20 2200  cefTRIAXone (ROCEPHIN) 2 g in sodium chloride 0.9 % 100 mL IVPB  Status:  Discontinued        2 g  200 mL/hr over 30 Minutes Intravenous Every 24 hours 05/10/20 2148 05/15/20 0911   05/10/20 2200  azithromycin (ZITHROMAX) 500 mg in sodium chloride 0.9 % 250 mL IVPB  Status:  Discontinued        500 mg 250 mL/hr over 60 Minutes Intravenous Every 24 hours 05/10/20 2148 05/13/20 1155        Time spent: 20 minutes    Erin Hearing ANP  [facility] Pager [phone]. If 7PM-7AM, please contact night-coverage at [email or web address] 07/12/2020, 10:29 AM  LOS: 62 days

## 2020-07-12 NOTE — Progress Notes (Signed)
Attempted to call reportx2 asked for Evan Chavez, was on hold for 28min first time and second time no answer. Pt just left hospital, called facility again and talked to [facility] of pt just left.pt going to facility in [place] via [place]

## 2020-07-12 NOTE — TOC Transition Note (Signed)
Transition of Care [facility]) - CM/SW Discharge Note   Patient Details  Name: Evan Chavez MRN: [id number] Date of Birth: [birth year]  Transition of Care [facility]) CM/SW Contact:  Benard Halsted, LCSW Phone Number: 07/12/2020, 3:07 PM   Clinical Narrative:    Patient will DC to: [facility] Anticipated DC date: 07/12/20 Family notified: mother Transport by: Medicaid (Signature)   Per MD patient ready for DC to [facility]. RN to call report prior to discharge [phone]). RN, patient, patient's family, and facility notified of DC. Discharge Summary and FL2 sent to facility. DC packet on chart. Ambulance transport requested for patient.   CSW will sign off for now as social work intervention is no longer needed. Please consult [place] again if new needs arise.      Final next level of care: Skilled Nursing Facility Barriers to Discharge: Barriers Resolved   Patient Goals and CMS Choice Patient states their goals for this hospitalization and ongoing recovery are:: To get better CMS Medicare.gov Compare Post Acute Care list provided to:: Patient Represenative (must comment) Kendal Hymen, mother) Choice offered to / list presented to : Parent  Discharge Placement   Existing PASRR number confirmed : 07/14/18          Patient chooses bed at: [facility] [place] Patient to be transferred to facility by: [employer]-Medicaid transport Name of family member notified: Kendal Hymen Patient and family notified of of transfer: 07/12/20  Discharge Plan and Services                                     Social Determinants of Health (SDOH) Interventions     Readmission Risk Interventions Readmission Risk Prevention Plan 06/09/2020  Transportation Screening Complete  PCP or Specialist Appt within 3-5 Days Complete  HRI or [place] Patient refused  Social Work Consult for [place] Planning/Counseling Complete  Palliative Care Screening Not  Applicable  Medication Review (RN Care Manager) Referral to Pharmacy  Some recent data might be hidden

## 2020-10-13 DEATH — deceased

## 2021-08-31 IMAGING — CT CT ANGIO CHEST
2 of 6 series · 17 of 46 positions shown · IV contrast (Omnipaque or Isovue)
Comparison: Chest x-ray from earlier in the same day, CTA from
07/12/2018.

CLINICAL DATA: Shortness of breath and weakness for several days

EXAM:
CT ANGIOGRAPHY CHEST WITH CONTRAST
TECHNIQUE: Multidetector CT imaging of the chest was performed using the
standard protocol during bolus administration of intravenous
contrast. Multiplanar CT image reconstructions and MIPs were
obtained to evaluate the vascular anatomy.
CONTRAST:  75mL OMNIPAQUE IOHEXOL 350 MG/ML SOLN

[Series 5: pe axial thins · axial · 0.77mm/px · z∈[+934,+1173]mm · 14 of 263 slices shown]
[im 12/263  lung]
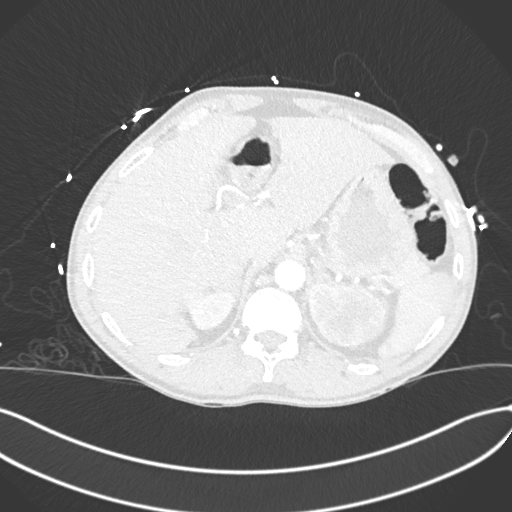
[im 35/263  soft-tissue]
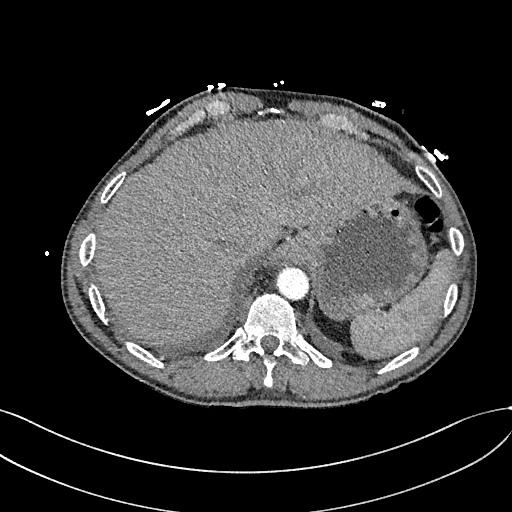
[im 46/263  lung]
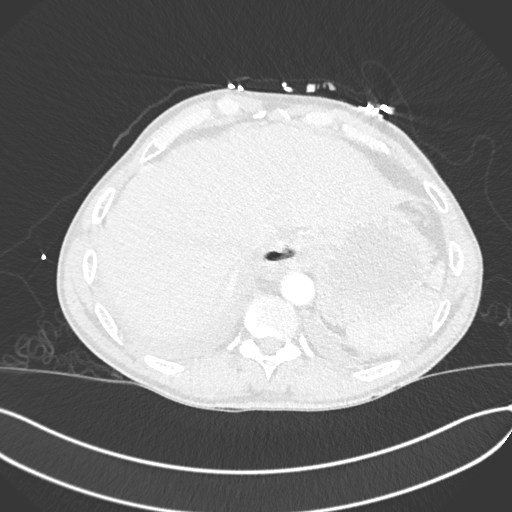
[im 69/263  soft-tissue]
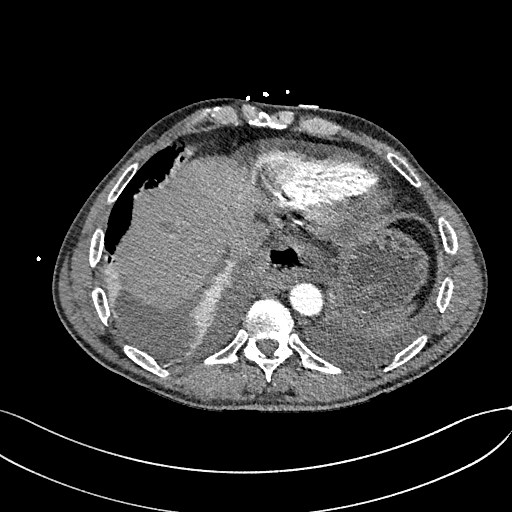
[im 92/263  lung]
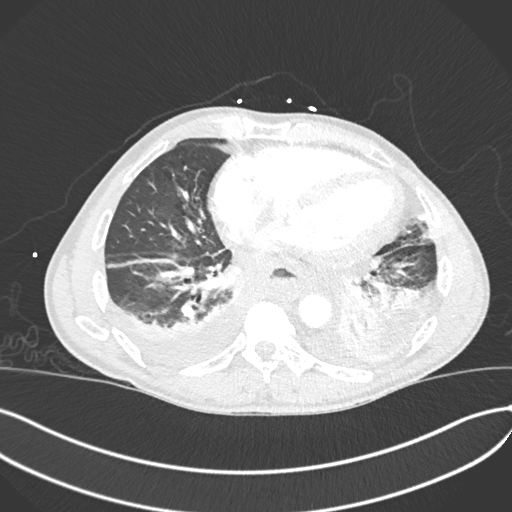
[im 103/263  soft-tissue]
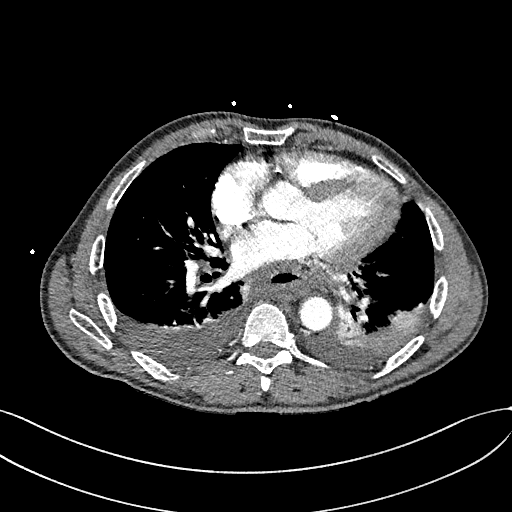
[im 126/263  lung]
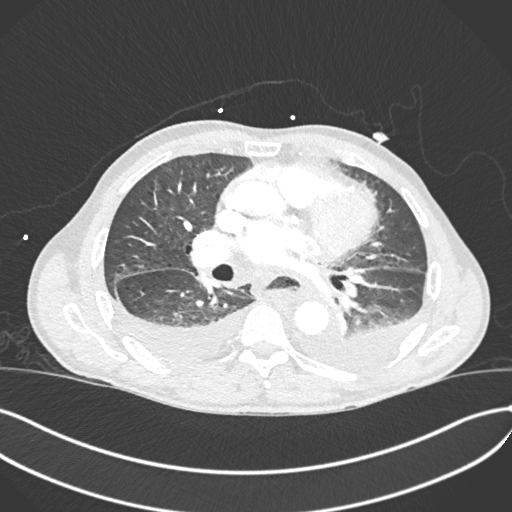
[im 137/263  soft-tissue]
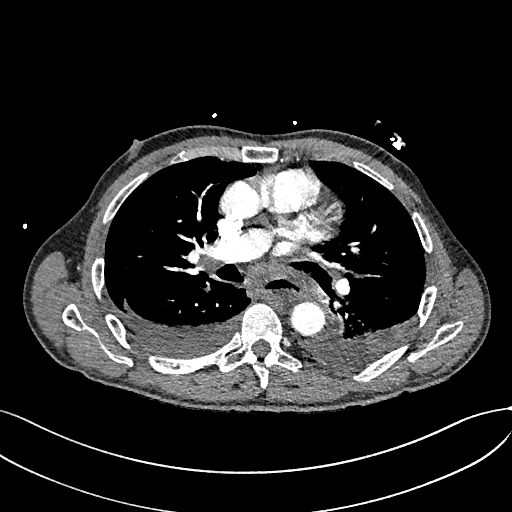
[im 160/263  lung]
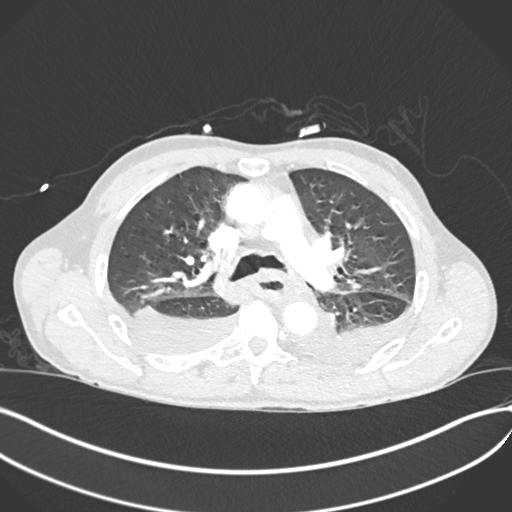
[im 171/263  soft-tissue]
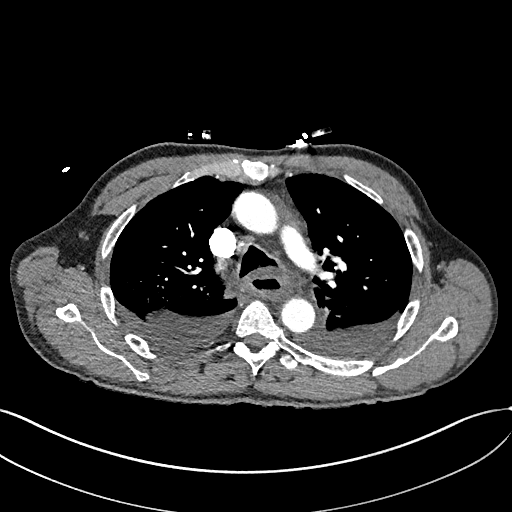
[im 194/263  lung]
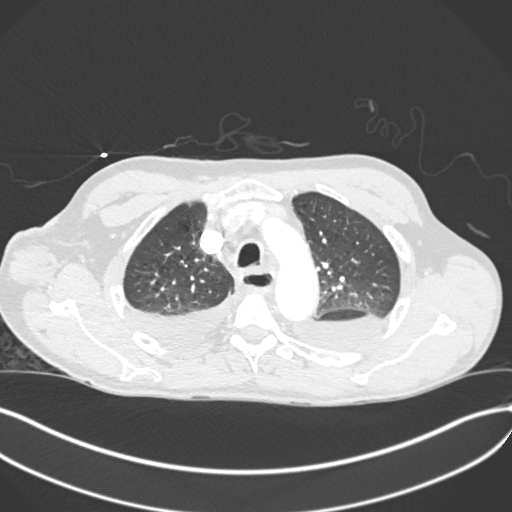
[im 217/263  soft-tissue]
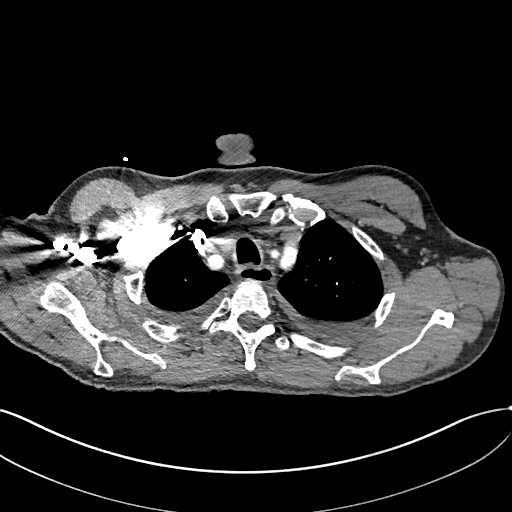
[im 228/263  lung]
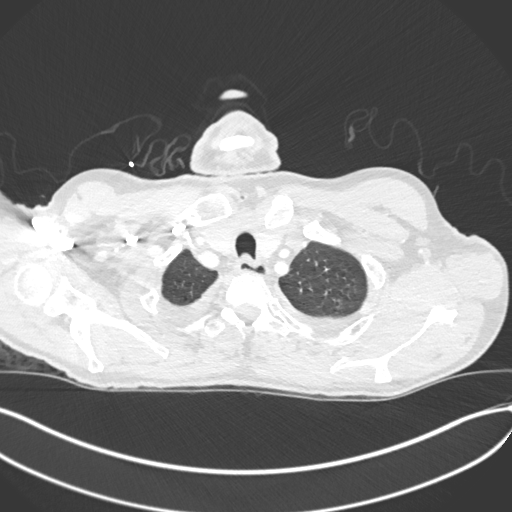
[im 251/263  soft-tissue]
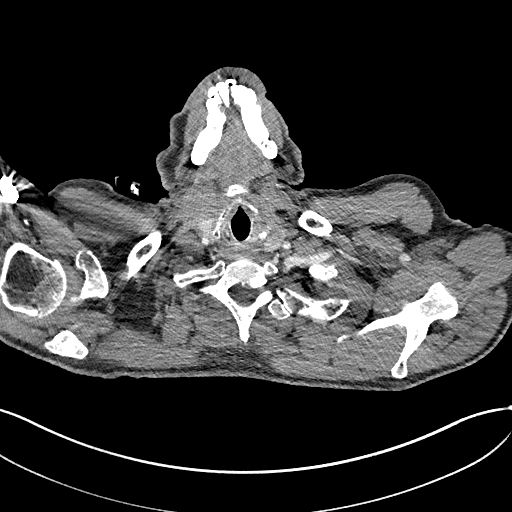

[Series 7: cor soft · coronal · 0.61mm/px · 3 of 151 slices shown]
[im 38/151  soft-tissue]
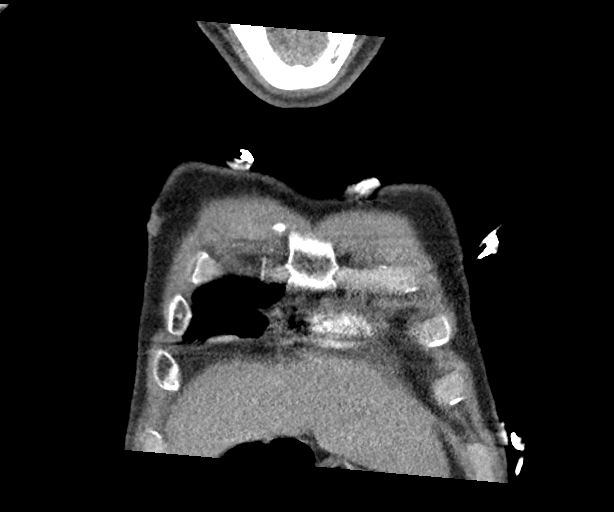
[im 76/151  soft-tissue]
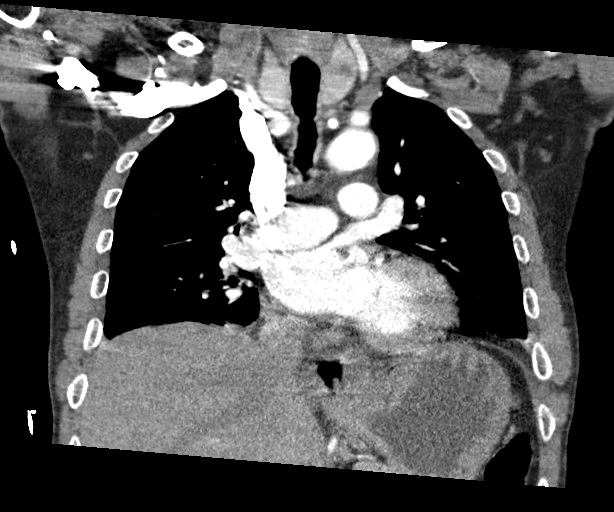
[im 113/151  soft-tissue]
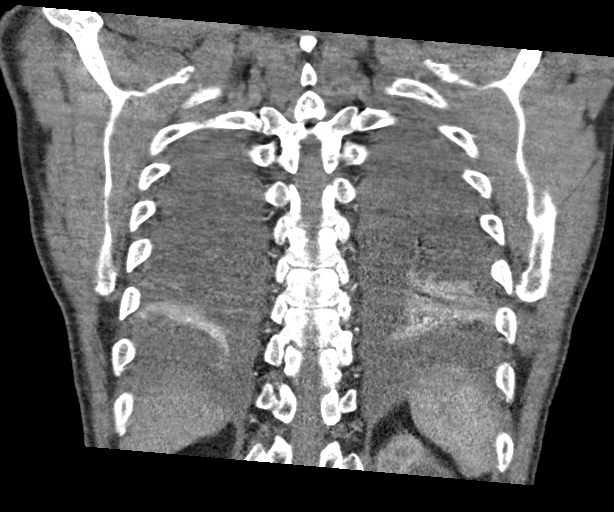

[17 of 46 positions shown; findings below may reference images not displayed]

FINDINGS: Cardiovascular: Thoracic aorta demonstrates atherosclerotic
calcifications. No aneurysmal dilatation or dissection is noted. No
cardiac enlargement is seen. Coronary calcifications are noted. The
pulmonary artery shows a normal branching pattern. No definitive
filling defect is identified to suggest pulmonary embolism.

Mediastinum/Nodes: Thoracic inlet shows a hypodensity within the
left lobe of the thyroid measuring 17 mm in greatest dimension. This
is relatively stable from a prior CT examination from 1477. No
sizable hilar or mediastinal adenopathy is noted. The esophagus
demonstrates some diffuse wall thickening which may be related to
reflux esophagitis.

Lungs/Pleura: Bilateral moderate pleural effusions are seen.
Associated lower lobe consolidation is noted. No sizable parenchymal
nodules are seen. No pneumothorax is noted.

Upper Abdomen: Visualized upper abdomen shows geographic decreased
attenuation within the left kidney and enlargement of the left
kidney. This may simply be related to hydronephrosis. The
possibility of an infiltrating mass deserves consideration. This can
be further evaluated with CT of the abdomen and pelvis with
contrast.

Musculoskeletal: No acute bony abnormality is noted.

Review of the MIP images confirms the above findings.
IMPRESSION: No evidence of pulmonary emboli.

Bilateral pleural effusions and lower lobe consolidation similar to
that seen on recent chest x-ray. This is consistent with bibasilar
pneumonia and reactive effusions.

Diffuse wall thickening throughout the esophagus likely related to
reflux esophagitis. Clinical correlation is recommended. It should
be noted this is stable from a prior exam of 1477.

Enlargement of the left kidney with decreased attenuation
identified. Although this may simply represent hydronephrosis
possibility of an underlying infiltrative mass deserves
consideration. CT of the abdomen and pelvis with contrast is
recommended.

17 mm left thyroid nodule. This is roughly stable from the prior CT
examination from 1477 and likely benign. Recommend thyroid US (ref:
[HOSPITAL]. [DATE]): 143-50).

## 2022-04-04 ENCOUNTER — Encounter: Payer: Self-pay | Admitting: *Deleted
